# Patient Record
Sex: Female | Born: 1955 | Race: White | Hispanic: No | Smoking: Former smoker
Health system: Southern US, Community
[De-identification: ages and names within clinical notes are randomized; demographics above are authoritative.]

## PROBLEM LIST (undated history)

## (undated) DIAGNOSIS — E785 Hyperlipidemia, unspecified: Secondary | ICD-10-CM

## (undated) DIAGNOSIS — J439 Emphysema, unspecified: Secondary | ICD-10-CM

## (undated) DIAGNOSIS — R112 Nausea with vomiting, unspecified: Secondary | ICD-10-CM

## (undated) DIAGNOSIS — N19 Unspecified kidney failure: Secondary | ICD-10-CM

## (undated) DIAGNOSIS — R0902 Hypoxemia: Secondary | ICD-10-CM

## (undated) DIAGNOSIS — I509 Heart failure, unspecified: Secondary | ICD-10-CM

## (undated) DIAGNOSIS — M797 Fibromyalgia: Secondary | ICD-10-CM

## (undated) DIAGNOSIS — K219 Gastro-esophageal reflux disease without esophagitis: Secondary | ICD-10-CM

## (undated) DIAGNOSIS — M722 Plantar fascial fibromatosis: Secondary | ICD-10-CM

## (undated) DIAGNOSIS — D649 Anemia, unspecified: Secondary | ICD-10-CM

## (undated) DIAGNOSIS — D689 Coagulation defect, unspecified: Secondary | ICD-10-CM

## (undated) DIAGNOSIS — M419 Scoliosis, unspecified: Secondary | ICD-10-CM

## (undated) DIAGNOSIS — J45909 Unspecified asthma, uncomplicated: Secondary | ICD-10-CM

## (undated) DIAGNOSIS — M3320 Polymyositis, organ involvement unspecified: Secondary | ICD-10-CM

## (undated) DIAGNOSIS — M1990 Unspecified osteoarthritis, unspecified site: Secondary | ICD-10-CM

## (undated) DIAGNOSIS — Z5189 Encounter for other specified aftercare: Secondary | ICD-10-CM

## (undated) DIAGNOSIS — M542 Cervicalgia: Secondary | ICD-10-CM

## (undated) DIAGNOSIS — B009 Herpesviral infection, unspecified: Secondary | ICD-10-CM

## (undated) DIAGNOSIS — K5090 Crohn's disease, unspecified, without complications: Secondary | ICD-10-CM

## (undated) DIAGNOSIS — Z9289 Personal history of other medical treatment: Secondary | ICD-10-CM

## (undated) DIAGNOSIS — K589 Irritable bowel syndrome without diarrhea: Secondary | ICD-10-CM

## (undated) DIAGNOSIS — T7840XA Allergy, unspecified, initial encounter: Secondary | ICD-10-CM

## (undated) DIAGNOSIS — F419 Anxiety disorder, unspecified: Secondary | ICD-10-CM

## (undated) DIAGNOSIS — Z9889 Other specified postprocedural states: Secondary | ICD-10-CM

## (undated) DIAGNOSIS — G7100 Muscular dystrophy, unspecified: Secondary | ICD-10-CM

## (undated) DIAGNOSIS — I10 Essential (primary) hypertension: Secondary | ICD-10-CM

## (undated) DIAGNOSIS — G43909 Migraine, unspecified, not intractable, without status migrainosus: Secondary | ICD-10-CM

## (undated) DIAGNOSIS — R079 Chest pain, unspecified: Secondary | ICD-10-CM

## (undated) DIAGNOSIS — E119 Type 2 diabetes mellitus without complications: Secondary | ICD-10-CM

## (undated) DIAGNOSIS — J449 Chronic obstructive pulmonary disease, unspecified: Secondary | ICD-10-CM

## (undated) DIAGNOSIS — K922 Gastrointestinal hemorrhage, unspecified: Secondary | ICD-10-CM

## (undated) HISTORY — DX: Type 2 diabetes mellitus without complications: E11.9

## (undated) HISTORY — DX: Unspecified asthma, uncomplicated: J45.909

## (undated) HISTORY — DX: Personal history of other medical treatment: Z92.89

## (undated) HISTORY — DX: Encounter for other specified aftercare: Z51.89

## (undated) HISTORY — PX: ROTATOR CUFF REPAIR: SHX139

## (undated) HISTORY — PX: OOPHORECTOMY: SHX86

## (undated) HISTORY — DX: Gastro-esophageal reflux disease without esophagitis: K21.9

## (undated) HISTORY — DX: Allergy, unspecified, initial encounter: T78.40XA

## (undated) HISTORY — DX: Essential (primary) hypertension: I10

## (undated) HISTORY — DX: Hyperlipidemia, unspecified: E78.5

## (undated) HISTORY — DX: Anemia, unspecified: D64.9

## (undated) HISTORY — DX: Emphysema, unspecified: J43.9

## (undated) HISTORY — DX: Heart failure, unspecified: I50.9

## (undated) HISTORY — DX: Hypoxemia: R09.02

## (undated) HISTORY — PX: CHOLECYSTECTOMY: SHX55

## (undated) HISTORY — DX: Coagulation defect, unspecified: D68.9

## (undated) HISTORY — DX: Gastrointestinal hemorrhage, unspecified: K92.2

## (undated) HISTORY — DX: Unspecified kidney failure: N19

## (undated) HISTORY — PX: BREAST SURGERY: SHX581

## (undated) HISTORY — DX: Anxiety disorder, unspecified: F41.9

## (undated) HISTORY — PX: COSMETIC SURGERY: SHX468

## (undated) HISTORY — PX: COLON SURGERY: SHX602

## (undated) HISTORY — PX: ABDOMINAL HYSTERECTOMY: SHX81

## (undated) HISTORY — PX: HERNIA REPAIR: SHX51

## (undated) HISTORY — PX: OTHER SURGICAL HISTORY: SHX169

## (undated) HISTORY — DX: Chest pain, unspecified: R07.9

## (undated) HISTORY — PX: MASTECTOMY PARTIAL / LUMPECTOMY: SUR851

---

## 1998-09-09 ENCOUNTER — Ambulatory Visit (HOSPITAL_COMMUNITY): Admission: RE | Admit: 1998-09-09 | Discharge: 1998-09-09 | Payer: Self-pay | Admitting: Internal Medicine

## 1998-11-22 ENCOUNTER — Emergency Department (HOSPITAL_COMMUNITY): Admission: EM | Admit: 1998-11-22 | Discharge: 1998-11-22 | Payer: Self-pay | Admitting: Emergency Medicine

## 1998-11-23 ENCOUNTER — Emergency Department (HOSPITAL_COMMUNITY): Admission: EM | Admit: 1998-11-23 | Discharge: 1998-11-23 | Payer: Self-pay | Admitting: Emergency Medicine

## 1998-11-24 ENCOUNTER — Emergency Department (HOSPITAL_COMMUNITY): Admission: EM | Admit: 1998-11-24 | Discharge: 1998-11-24 | Payer: Self-pay | Admitting: Emergency Medicine

## 1999-06-08 ENCOUNTER — Encounter: Payer: Self-pay | Admitting: Internal Medicine

## 1999-06-08 ENCOUNTER — Encounter: Admission: RE | Admit: 1999-06-08 | Discharge: 1999-06-08 | Payer: Self-pay | Admitting: Internal Medicine

## 1999-06-11 ENCOUNTER — Encounter: Payer: Self-pay | Admitting: Internal Medicine

## 1999-06-11 ENCOUNTER — Encounter: Admission: RE | Admit: 1999-06-11 | Discharge: 1999-06-11 | Payer: Self-pay | Admitting: Internal Medicine

## 1999-06-12 ENCOUNTER — Inpatient Hospital Stay (HOSPITAL_COMMUNITY): Admission: AD | Admit: 1999-06-12 | Discharge: 1999-06-18 | Payer: Self-pay | Admitting: Internal Medicine

## 1999-06-13 ENCOUNTER — Encounter: Payer: Self-pay | Admitting: Internal Medicine

## 1999-06-14 ENCOUNTER — Encounter: Payer: Self-pay | Admitting: Internal Medicine

## 1999-06-15 ENCOUNTER — Encounter: Payer: Self-pay | Admitting: Internal Medicine

## 1999-06-16 ENCOUNTER — Encounter: Payer: Self-pay | Admitting: Internal Medicine

## 1999-06-17 ENCOUNTER — Encounter: Payer: Self-pay | Admitting: Internal Medicine

## 1999-07-20 ENCOUNTER — Encounter: Payer: Self-pay | Admitting: Internal Medicine

## 1999-07-21 ENCOUNTER — Inpatient Hospital Stay (HOSPITAL_COMMUNITY): Admission: EM | Admit: 1999-07-21 | Discharge: 1999-07-24 | Payer: Self-pay | Admitting: Emergency Medicine

## 1999-07-22 ENCOUNTER — Encounter: Payer: Self-pay | Admitting: Internal Medicine

## 1999-09-27 ENCOUNTER — Encounter: Admission: RE | Admit: 1999-09-27 | Discharge: 1999-09-27 | Payer: Self-pay | Admitting: Internal Medicine

## 1999-09-27 ENCOUNTER — Encounter: Payer: Self-pay | Admitting: Internal Medicine

## 1999-10-05 ENCOUNTER — Ambulatory Visit (HOSPITAL_COMMUNITY): Admission: RE | Admit: 1999-10-05 | Discharge: 1999-10-05 | Payer: Self-pay | Admitting: Internal Medicine

## 1999-10-11 ENCOUNTER — Encounter: Admission: RE | Admit: 1999-10-11 | Discharge: 1999-10-11 | Payer: Self-pay | Admitting: Urology

## 1999-10-11 ENCOUNTER — Encounter: Payer: Self-pay | Admitting: Urology

## 2000-03-06 ENCOUNTER — Emergency Department (HOSPITAL_COMMUNITY): Admission: EM | Admit: 2000-03-06 | Discharge: 2000-03-06 | Payer: Self-pay | Admitting: Emergency Medicine

## 2000-04-04 ENCOUNTER — Emergency Department (HOSPITAL_COMMUNITY): Admission: EM | Admit: 2000-04-04 | Discharge: 2000-04-04 | Payer: Self-pay | Admitting: Emergency Medicine

## 2000-05-08 ENCOUNTER — Emergency Department (HOSPITAL_COMMUNITY): Admission: EM | Admit: 2000-05-08 | Discharge: 2000-05-08 | Payer: Self-pay | Admitting: Emergency Medicine

## 2000-05-09 ENCOUNTER — Ambulatory Visit (HOSPITAL_COMMUNITY): Admission: RE | Admit: 2000-05-09 | Discharge: 2000-05-09 | Payer: Self-pay | Admitting: Gastroenterology

## 2000-05-09 ENCOUNTER — Encounter (INDEPENDENT_AMBULATORY_CARE_PROVIDER_SITE_OTHER): Payer: Self-pay | Admitting: Specialist

## 2000-07-05 ENCOUNTER — Encounter: Payer: Self-pay | Admitting: Pulmonary Disease

## 2000-07-05 ENCOUNTER — Ambulatory Visit (HOSPITAL_COMMUNITY): Admission: RE | Admit: 2000-07-05 | Discharge: 2000-07-05 | Payer: Self-pay | Admitting: Pulmonary Disease

## 2000-08-21 ENCOUNTER — Encounter: Admission: RE | Admit: 2000-08-21 | Discharge: 2000-08-21 | Payer: Self-pay | Admitting: Internal Medicine

## 2000-08-21 ENCOUNTER — Encounter: Payer: Self-pay | Admitting: Internal Medicine

## 2000-12-04 ENCOUNTER — Encounter: Payer: Self-pay | Admitting: Internal Medicine

## 2000-12-04 ENCOUNTER — Encounter: Admission: RE | Admit: 2000-12-04 | Discharge: 2000-12-04 | Payer: Self-pay | Admitting: Internal Medicine

## 2001-03-12 ENCOUNTER — Encounter: Payer: Self-pay | Admitting: Internal Medicine

## 2001-03-12 ENCOUNTER — Encounter: Admission: RE | Admit: 2001-03-12 | Discharge: 2001-03-12 | Payer: Self-pay | Admitting: Internal Medicine

## 2001-03-22 ENCOUNTER — Encounter: Payer: Self-pay | Admitting: General Surgery

## 2001-03-23 ENCOUNTER — Encounter (INDEPENDENT_AMBULATORY_CARE_PROVIDER_SITE_OTHER): Payer: Self-pay | Admitting: Specialist

## 2001-03-23 ENCOUNTER — Ambulatory Visit (HOSPITAL_COMMUNITY): Admission: RE | Admit: 2001-03-23 | Discharge: 2001-03-23 | Payer: Self-pay | Admitting: General Surgery

## 2001-07-12 ENCOUNTER — Emergency Department (HOSPITAL_COMMUNITY): Admission: EM | Admit: 2001-07-12 | Discharge: 2001-07-12 | Payer: Self-pay

## 2001-10-15 ENCOUNTER — Ambulatory Visit (HOSPITAL_BASED_OUTPATIENT_CLINIC_OR_DEPARTMENT_OTHER): Admission: RE | Admit: 2001-10-15 | Discharge: 2001-10-15 | Payer: Self-pay | Admitting: Internal Medicine

## 2002-03-01 ENCOUNTER — Encounter: Payer: Self-pay | Admitting: General Surgery

## 2002-03-01 ENCOUNTER — Encounter: Admission: RE | Admit: 2002-03-01 | Discharge: 2002-03-01 | Payer: Self-pay | Admitting: General Surgery

## 2003-03-07 ENCOUNTER — Encounter: Payer: Self-pay | Admitting: General Surgery

## 2003-03-07 ENCOUNTER — Encounter: Admission: RE | Admit: 2003-03-07 | Discharge: 2003-03-07 | Payer: Self-pay | Admitting: General Surgery

## 2003-03-14 ENCOUNTER — Encounter: Payer: Self-pay | Admitting: Pulmonary Disease

## 2003-03-14 ENCOUNTER — Ambulatory Visit (HOSPITAL_COMMUNITY): Admission: RE | Admit: 2003-03-14 | Discharge: 2003-03-14 | Payer: Self-pay | Admitting: Pulmonary Disease

## 2004-03-24 ENCOUNTER — Other Ambulatory Visit: Admission: RE | Admit: 2004-03-24 | Discharge: 2004-03-24 | Payer: Self-pay | Admitting: *Deleted

## 2004-04-23 ENCOUNTER — Encounter: Admission: RE | Admit: 2004-04-23 | Discharge: 2004-04-23 | Payer: Self-pay | Admitting: *Deleted

## 2004-06-09 ENCOUNTER — Encounter (INDEPENDENT_AMBULATORY_CARE_PROVIDER_SITE_OTHER): Payer: Self-pay | Admitting: *Deleted

## 2004-06-10 ENCOUNTER — Inpatient Hospital Stay (HOSPITAL_COMMUNITY): Admission: RE | Admit: 2004-06-10 | Discharge: 2004-06-11 | Payer: Self-pay | Admitting: Orthopedic Surgery

## 2005-02-01 ENCOUNTER — Encounter (INDEPENDENT_AMBULATORY_CARE_PROVIDER_SITE_OTHER): Payer: Self-pay | Admitting: *Deleted

## 2005-02-01 ENCOUNTER — Ambulatory Visit (HOSPITAL_COMMUNITY): Admission: RE | Admit: 2005-02-01 | Discharge: 2005-02-01 | Payer: Self-pay | Admitting: General Surgery

## 2005-02-01 ENCOUNTER — Ambulatory Visit (HOSPITAL_BASED_OUTPATIENT_CLINIC_OR_DEPARTMENT_OTHER): Admission: RE | Admit: 2005-02-01 | Discharge: 2005-02-01 | Payer: Self-pay | Admitting: General Surgery

## 2005-04-25 ENCOUNTER — Other Ambulatory Visit: Admission: RE | Admit: 2005-04-25 | Discharge: 2005-04-25 | Payer: Self-pay | Admitting: *Deleted

## 2005-05-09 ENCOUNTER — Encounter: Admission: RE | Admit: 2005-05-09 | Discharge: 2005-05-09 | Payer: Self-pay | Admitting: Unknown Physician Specialty

## 2005-05-18 ENCOUNTER — Encounter: Admission: RE | Admit: 2005-05-18 | Discharge: 2005-05-18 | Payer: Self-pay | Admitting: Unknown Physician Specialty

## 2005-08-25 ENCOUNTER — Encounter: Admission: RE | Admit: 2005-08-25 | Discharge: 2005-08-25 | Payer: Self-pay | Admitting: Orthopedic Surgery

## 2005-08-26 ENCOUNTER — Ambulatory Visit (HOSPITAL_BASED_OUTPATIENT_CLINIC_OR_DEPARTMENT_OTHER): Admission: RE | Admit: 2005-08-26 | Discharge: 2005-08-26 | Payer: Self-pay | Admitting: Orthopedic Surgery

## 2006-08-22 ENCOUNTER — Other Ambulatory Visit: Admission: RE | Admit: 2006-08-22 | Discharge: 2006-08-22 | Payer: Self-pay | Admitting: *Deleted

## 2007-03-05 ENCOUNTER — Ambulatory Visit (HOSPITAL_COMMUNITY): Admission: RE | Admit: 2007-03-05 | Discharge: 2007-03-05 | Payer: Self-pay | Admitting: Anesthesiology

## 2007-04-18 ENCOUNTER — Encounter: Admission: RE | Admit: 2007-04-18 | Discharge: 2007-04-18 | Payer: Self-pay | Admitting: Unknown Physician Specialty

## 2007-11-13 ENCOUNTER — Other Ambulatory Visit: Admission: RE | Admit: 2007-11-13 | Discharge: 2007-11-13 | Payer: Self-pay | Admitting: *Deleted

## 2008-05-15 ENCOUNTER — Ambulatory Visit (HOSPITAL_BASED_OUTPATIENT_CLINIC_OR_DEPARTMENT_OTHER): Admission: RE | Admit: 2008-05-15 | Discharge: 2008-05-15 | Payer: Self-pay | Admitting: *Deleted

## 2008-11-14 ENCOUNTER — Encounter: Admission: RE | Admit: 2008-11-14 | Discharge: 2008-11-14 | Payer: Self-pay | Admitting: Internal Medicine

## 2008-12-15 ENCOUNTER — Emergency Department (HOSPITAL_BASED_OUTPATIENT_CLINIC_OR_DEPARTMENT_OTHER): Admission: EM | Admit: 2008-12-15 | Discharge: 2008-12-15 | Payer: Self-pay | Admitting: Emergency Medicine

## 2008-12-15 ENCOUNTER — Ambulatory Visit: Payer: Self-pay | Admitting: Diagnostic Radiology

## 2009-10-30 ENCOUNTER — Encounter: Admission: RE | Admit: 2009-10-30 | Discharge: 2009-10-30 | Payer: Self-pay | Admitting: Unknown Physician Specialty

## 2010-08-29 ENCOUNTER — Encounter: Payer: Self-pay | Admitting: Internal Medicine

## 2010-08-30 ENCOUNTER — Encounter: Payer: Self-pay | Admitting: Unknown Physician Specialty

## 2010-11-08 ENCOUNTER — Other Ambulatory Visit (HOSPITAL_BASED_OUTPATIENT_CLINIC_OR_DEPARTMENT_OTHER): Payer: Self-pay | Admitting: *Deleted

## 2010-11-08 DIAGNOSIS — Z1231 Encounter for screening mammogram for malignant neoplasm of breast: Secondary | ICD-10-CM

## 2010-11-16 ENCOUNTER — Ambulatory Visit (HOSPITAL_BASED_OUTPATIENT_CLINIC_OR_DEPARTMENT_OTHER): Payer: BC Managed Care – PPO

## 2010-11-16 LAB — DIFFERENTIAL
Basophils Absolute: 0.1 10*3/uL (ref 0.0–0.1)
Eosinophils Absolute: 0.3 10*3/uL (ref 0.0–0.7)
Lymphocytes Relative: 46 % (ref 12–46)
Monocytes Absolute: 0.5 10*3/uL (ref 0.1–1.0)
Neutro Abs: 3.8 10*3/uL (ref 1.7–7.7)

## 2010-11-16 LAB — CBC
Hemoglobin: 15 g/dL (ref 12.0–15.0)
MCHC: 34.6 g/dL (ref 30.0–36.0)
MCV: 88.5 fL (ref 78.0–100.0)
Platelets: 434 10*3/uL — ABNORMAL HIGH (ref 150–400)
RDW: 12.4 % (ref 11.5–15.5)
WBC: 8.7 10*3/uL (ref 4.0–10.5)

## 2010-11-16 LAB — BASIC METABOLIC PANEL
CO2: 29 mEq/L (ref 19–32)
Calcium: 10.2 mg/dL (ref 8.4–10.5)
GFR calc Af Amer: >60 mL/min (ref >60)
Potassium: 3.7 mEq/L (ref 3.5–5.1)
Sodium: 144 mEq/L (ref 135–145)

## 2010-11-16 LAB — URINALYSIS, ROUTINE W REFLEX MICROSCOPIC
Bilirubin Urine: NEGATIVE
Ketones, ur: NEGATIVE mg/dL

## 2010-11-24 ENCOUNTER — Ambulatory Visit (HOSPITAL_BASED_OUTPATIENT_CLINIC_OR_DEPARTMENT_OTHER): Payer: BC Managed Care – PPO

## 2010-12-01 ENCOUNTER — Ambulatory Visit (HOSPITAL_BASED_OUTPATIENT_CLINIC_OR_DEPARTMENT_OTHER)
Admission: RE | Admit: 2010-12-01 | Discharge: 2010-12-01 | Disposition: A | Discharge status: Home / Self Care | Payer: BC Managed Care – PPO | Source: Ambulatory Visit | Attending: Diagnostic Radiology | Admitting: Diagnostic Radiology

## 2010-12-01 DIAGNOSIS — Z1231 Encounter for screening mammogram for malignant neoplasm of breast: Secondary | ICD-10-CM | POA: Insufficient documentation

## 2010-12-24 NOTE — Op Note (Signed)
NAME:  Kayla Ryan, Kayla Ryan                ACCOUNT NO.:  [id number]   MEDICAL RECORD NO.:  [id number]          PATIENT TYPE:  AMB   LOCATION:  DSC                          FACILITY:  [facility]   PHYSICIAN:  Alta Corning, M.D.   DATE OF BIRTH:  10/17/1955   DATE OF PROCEDURE:  08/26/2005  DATE OF DISCHARGE:                                 OPERATIVE REPORT   PREOPERATIVE DIAGNOSIS:  1.  Carpal tunnel syndrome, left.  2.  CMC arthritis.   POSTOPERATIVE DIAGNOSIS:  1.  Carpal tunnel syndrome, left.  2.  CMC arthritis.   PROCEDURE:  1.  Left carpal tunnel release.  2.  CMC injection under anesthesia.   SURGEON:  Alta Corning, M.D.   ASSISTANT:  Gary Fleet, P.A.-C.   ANESTHESIA:  General.   BRIEF HISTORY:  55 year old female with a long history of having significant  pain in the thumb, index, and long finger of her left hand.  She had had  previous carpal tunnel issues from the opposite side and felt that this was  similar on this side.  EMGs showed that she had carpal tunnel syndrome.  We  talked about treatment options including injection therapy, activity  modification, but ultimately because of continued complaints of pain and  failure of conservative care, she is taken to the operating room for carpal  tunnel surgery.  Preoperative x-rays showed that she had significant  narrowing at the [facility] joint.  She was also having some positive grind and  pain at the [facility] joint.  We talked about injection therapy and she asked that  this be done under anesthesia and certainly we were willing to accommodate  this.  She is brought to the operating room for these procedures.   DESCRIPTION OF PROCEDURE:  The patient was taken to the operating room and  after adequate anesthesia was obtained with general anesthetic, the patient  was placed supine upon the operating table.  The left arm was prepped and  draped in the usual sterile fashion.  Following this, the arm was  exsanguinated and blood  pressure tourniquet inflated to 250 mmHg.  Following  this, a curved incision was made just ulnar to the midline wrist crease.  The subcutaneous tissue were dissected down to the level of the volar carpal  ligament.  The volar carpal ligament was clearly identified, divided, with a  small rent and then a Freer elevator was used to make sure the nerve was not  adherent on the under surface.  The ligament was then divided proximally and  distally such that a gloved finger could be placed in the wound proximally  and distally.  The median nerve was identified and noted to have the  hourglass constriction under the area of the volar ligament and there was no  problems with the motor branch of the median nerve.  At this point, the  wound was copiously irrigated and  suctioned dry.  The skin was closed with a combination of interrupted and  running suture.   At this point, the [facility] joint was palpated and 1 mL  of Depo-Medrol 80 mg per  mL was injected into the first [facility] joint.  At this point, a sterile  compressive dressing was applied as well as a volar plaster and the patient  was taken to the recovery room and noted to be in satisfactory condition.  Estimated blood loss was none.      Alta Corning, M.D.  Electronically Signed     JLG/MEDQ  D:  08/26/2005  T:  08/26/2005  Job:  [id number]

## 2010-12-24 NOTE — Discharge Summary (Signed)
[place]  Patient:    Kayla Ryan, Kayla Ryan                      MRN: [id number] Adm. Date:  07/21/99 Disc. Date: 07/24/99 Attending:  Randall Hiss L. Marlou Sa, M.D.                           Discharge Summary  FINAL DIAGNOSES: 1. Gastritis and gastroduodenitis without hemorrhage. 2. Otitis media. 3. Chronic sinusitis. 4. Migraine, unspecified. 5. Bronchitis, acute and chronic. 6. Irritable bowel syndrome. 7. Recurrent depression. 8. Prolonged post-traumatic stress.  OPERATIONS AND PROCEDURES:  EGD with closed biopsy, per Dr. Amedeo Plenty.  HISTORY OF PRESENT ILLNESS:  Patient is a 55 year old married white female who presents with a several day history of increasing right-sided abdominal pain with cramping, nonradiating, associated with nausea and vomiting.  Patient notably now presents with increasing fever, headache, arthralgia, sore throat, and cough. Strep test has been negative.  She was noted to have a white count elevated at 16,500.  PHYSICAL EXAMINATION:  Per admission H&P.  HOSPITAL COURSE:  Patient was admitted for further evaluation of fever with nausea and vomiting.  She was initially admitted for 23 hour observation.  She was given IV fluids with potassium supplementation as well.  She notably, however, did not improve.  On further exam, there was evidence for significant bronchitis, which was more apparent after hydration.  She also had recurring fever as well.  Fever initially was maximum 102.8 and defervesced to 100.7.  Blood cultures were obtained, which were negative.  Patient was continued on empiric antibiotics. he notably had recurring abdominal pain on further evaluation, mainly in the left upper quadrant and left lower quadrant.  Mild tenderness in the right upper quadrant as well.  She subsequently underwent abdominal CT scan.  This showed no significant mass effect or cancer.  No evidence of hernias or obstructions. Patient  was continued on analgesia for pain, with gradual advancement of her diet. She notably, however, continued to have significant upper abdominal discomfort.  She was subsequently seen in evaluation by Dr. Amedeo Plenty.  She thereafter underwent an EGD, which showed mild gastroenteritis.  No other masses were found.  Cold test  was negative.  The patient was continued on proton pump inhibitors, which she, ver the subsequent days, gradually improved.  Patient notably, during the hospital stay, was found to demonstrate significant  situational stress.  This was discussed during the hospital stay.  It was made quite clear that her stress did exacerbate abdominal pain, as well. Arrangements were made for patient to be seen initially by Dr. Rhona Raider, psychiatry.  She was subsequently started on Zoloft for depression, with ______ follow-up as an outpatient.  With antibiotic therapy, patients bronchitis as well as sinusitis also improved considerably.  Appetite gradually improved, and patient became more ambulatory.  By July 24, 1999 she was feeling considerably better. Abdominal pain, gastritis, and irritable bowel syndrome were under much better control. er bronchitis was much improved, as well.  She was felt to be stable for discharge.  MEDICATIONS AT THE TIME OF DISCHARGE: 1. Zoloft 50 mg p.o. q.d. 2. Cipro 500 mg b.i.d. x 5 days. 3. Lomotil q.i.d. p.r.n. diarrhea. 4. Continue Prevacid 30 mg b.i.d. 5. Darvocet N-100 q.4h. p.r.n. pain.  FOLLOW-UP:  Patient will be followed up as an outpatient by GI and Dr. Rhona Raider. She will be seen in the office in two  weeks time. DD:  08/19/99 TD:  08/19/99 Job: [id number]

## 2010-12-24 NOTE — Op Note (Signed)
NAME:  Kayla Ryan, Kayla Ryan                ACCOUNT NO.:  [id number]   MEDICAL RECORD NO.:  [id number]          PATIENT TYPE:  AMB   LOCATION:  DAY                          [facility]  [facility]   PHYSICIAN:  Doran Heater. Fore, M.D.   DATE OF BIRTH:  July 19, 1956   DATE OF PROCEDURE:  06/09/2004  DATE OF DISCHARGE:                                 OPERATIVE REPORT   PREOPERATIVE DIAGNOSES:  1.  Abdominal and pelvic pain, status post hysterectomy and bilateral      salpingo-oophorectomy for endometriosis in 1998.  2.  Cystocele, rectocele, probable enterocele.   POSTOPERATIVE DIAGNOSES:  1.  Abdominal and pelvic pain, status post hysterectomy and bilateral      salpingo-oophorectomy for endometriosis in 1998.  2.  Cystocele, rectocele, probable enterocele.   OPERATION:  1.  Laparoscopy with extensive enterolysis.  2.  Anterior and posterior colporrhaphy with repair of enterocele.   ANESTHESIA:  General orotracheal.   OPERATOR:  Doran Heater. Warnell Forester, M.D.   FIRST ASSISTANT:  Deidre Ala, M.D.   INDICATION FOR SURGERY:  The patient is a 55 year old, who has the above-  noted problems and was counseled as to the need for surgery to evaluate and  treat these problems.  She was fully counseled as to the nature of the  procedure and the risks involved to include risk of anesthesia; injury to  bowel, bladder, blood vessels, ureters, postoperative hemorrhage, infection,  recuperation.  She fully understands all of these considerations and wishes  to proceed on 09 June 2004 and has signed informed consent for this  procedure.   OPERATIVE FINDINGS:  On laparoscopy, it was noted that she had multiple  adhesions involving loops of bowel to each other and to the anterior  peritoneal surface.  On pelvic exam, there was noted to be a moderate  cystocele and moderate to severe rectocele with enterocele present.   DESCRIPTION OF PROCEDURE:  With the patient under general anesthesia,  prepared and draped in the  usual sterile fashion, a Foley catheter was  placed in the bladder.  Bladder was then distended using 600 mL of normal  saline, and a Bonanno suprapubic catheter was placed without difficulty.  The Foley catheter was reattached to straight drainage, and the patient was  then re-prepared and draped for a laparoscopic procedure.  An incision was  made in the lower pole of the umbilicus with insertion of the Veress cannula  and insufflation of 3 L of carbon dioxide.  The disposable 10 mm trocar  through the operative scope, and the scope itself was then inserted into the  peritoneal cavity.  The disposable 5 mm probe was inserted through a stab  wound followed by the symphysis pubis in the midline.  The above-noted  findings were visualized.  Bipolar electrocoagulation and sharp dissection  were used to carefully dissect the adhesions.  This required approximately  45 minutes of operative time to successfully lyse all the adhesions.  This  was accomplished without difficulty and without injury to the bowel or other  viscera in the pelvis.  After observing the area for  hemostasis, after  reducing intra-abdominal pressure by allowing gas to escape, the instruments  were removed from the peritoneal cavity.  Gas was allowed to fully escape,  and the incisions were closed with fascial sutures of 2-0 Vicryl and  subcuticular sutures of 3-0 plain catgut.   The patient was then repositioned for a vaginal procedure.  A weighted  speculum was placed in the posterior vagina, and the anterior vaginal wall  was grasped using Allis clamps.  A solution of 1% lidocaine with 1:100  epinephrine was injected at the midline under the cystocele and urethrocele.  An incision was made in this area with dissection up to the urethrovesical  angle.  The cystocele was reduced using imbricated sutures of 0 Vicryl.  Redundant vaginal mucosa was excised, and the mucosa was then reapproximated  and rendered hemostatic with  a continuous interlocking suture of 2-0 chromic  catgut.   Attention was then directed to the posterior colporrhaphy.  Allis clamps  were placed at the mucocutaneous junctions and in the midline overlying the  rectocele and enterocele.  Again, a solution of 1% lidocaine was injected in  this area; a total of 8 mL was used through the entire procedure.  An  incision was made in the midline of the vaginal mucosa with gradual  dissection of the underlying fascia and enterocele sac off this area.  The  enterocele was reduced and closed off with pursestring sutures of 0 Vicryl.  This was accomplished in layers with good reduction of the enterocele.  The  rectocele was likewise reduced with imbricating layers of 0 Vicryl.  Redundant vaginal mucosa was trimmed, and the mucosa was then reapproximated  and rendered hemostatic with a continuous interlocking suture of 2-0 chromic  catgut.  Estimated blood loss for the entire procedure was less than 50 mL.  The patient was taken to the recovery room in good condition with clear  urine in the suprapubic catheter tubing.  She will be admitted following  surgery.      SRF/MEDQ  D:  06/09/2004  T:  06/09/2004  Job:  [id number]   cc:   Deidre Ala, M.D.  [address] [place]  [place] [address]  Fax: [phone]

## 2010-12-24 NOTE — Discharge Summary (Signed)
[place]  Patient:    Kayla Ryan, Kayla Ryan                       MRN: [id number] Adm. Date:  [id number] Disc. Date: [id number] Attending:  Rogers Blocker                           Discharge Summary  FINAL DIAGNOSES: 1. Abdominal pain. 2. Rule out colonic spasm. 3. Gastroesophageal reflux disease. 4. Tobacco abuse. 5. Mild bronchitis. 6. Sinusitis. 7. Anxiety disorder, recent stress.  PROCEDURES: 1. Upper GI with small-bowel follow-through. 2. MRI scan of the brain. 3. Ultrasound of the gallbladder.  HISTORY OF PRESENT ILLNESS:  The patient is a 55 year old married white female who has been experiencing recurrent nausea and vomiting of one weeks duration.  She had not been feeling well until approximately two weeks ago. At that time, she noted increasing epigastric discomfort.  This was associated with mild heartburn at times as  well.  The patient had also noted problems with mild constipation with intermittent diarrhea.  She has a history of previously irritable bowel syndrome as well as mild gastritis.  The patient has been taking Prevacid and Levsin on a regular basis; however, over the past week she has noted increasing problems with heartburn and indigestion.  The patient subsequently began experiencing recurrent nausea and vomiting over the one week prior to admission.  Appetite remained variable.  Due to progression of her symptoms, she was admitted for further evaluation and therapy.  PAST MEDICAL HISTORY:  Per admission H&P.  PHYSICAL EXAMINATION:  Per admission H&P.  HOSPITAL COURSE:  The patient was admitted for further evaluation of recurrent nausea and vomiting.  She has a history suggestive of gastroesophageal reflux as well.  She was started on IV fluids for hydration.  The patient was started on IV Pepcid as well as IV Reglan for reflux disease.  Over the subsequent days, the patient continued to have intermittent abdominal pain,  the etiology of which remained unclear.  She did undergo a barium swallow with upper GI and small-bowel follow-through.  This did not demonstrate evidence of hiatal hernia.  No stricture was noted.  No obstruction was noted to the small-bowel follow-through.  Due to the patients episodic nausea, she was started on Reglan as well.  She was continued on full-time pump inhibitors.  On further serial exam, she was found to have intermittent left upper quadrant tenderness.  A gallbladder study was subsequently performed, looking at the gallbladder and pancreas.  This showed no evidence of stones, questionable sludge.  There was a question of fluid around the gallbladder.   In lieu of the questionable status of her cause of her stomach pain, she was subsequently seen by GI.  The patient was evaluated by Dr. Amedeo Plenty extensively.  It was felt that no further workup would be pursued at this time.  It was felt that the patient has significant psychological overlay for GI symptoms.  The issues regarding stressors were discussed with the patient.  She did acknowledge part of this and it was planned on having further followup as an outpatient.  By June 18, 1999, the patient was doing considerably better. Plans were subsequently made for her to be discharged home.  DISCHARGE MEDICATIONS: 1. Prevacid 30 mg p.o. b.i.d. 2. Xanax 1 mg 3-4 x a day. 3. Claritin 10 mg p.o. q.d. 4. Darvocet-N 100 1-2 p.o.  q.4h. p.r.n. 5. Phenergan 25 mg p.o. q.4h. p.r.n. 6. Levsin 1 sublingual q.4h. p.r.n.  DIET:  The patient will reduce the caffeine her diet as well as fried foods and avoid sweets.  ACTIVITY:  As tolerated.  FOLLOWUP:  She will be seen in the office in two weeks time for followup. DD:  10/21/99 TD:  10/21/99 Job: [id number]

## 2010-12-24 NOTE — Op Note (Signed)
[facility]  Patient:    [name], [name]                                 Visit Number: [id number] MRN: [id number]          Service Type: DSU Location: DAY Attending:  Marlan Palau Proc. Date: 03/23/01 Adm. Date:  [id number]                             Operative Report  PREOPERATIVE DIAGNOSES:  Right nipple discharge and intraductal papilloma, right breast.  POSTOPERATIVE DIAGNOSES:  Right nipple discharge and intraductal papilloma, right breast.  SURGICAL PROCEDURE:  Right breast central duct excision.  SURGEON:  Darene Lamer. Hoxworth, M.D.  ANESTHESIA:  General.  BRIEF HISTORY:  Kayla Ryan is a 55 year old female with a long history of fibrocystic disease.  She has persistent bloody nipple discharge from the right, and a ductogram has been performed showing a good sized intraductal papilloma at about 9 oclock.  After discussion of options, we have elected to proceed with excision of the central ducts in the right breast.  The surgical procedure, the indications, the risks of bleeding, infection were discussed and understood preoperatively.  She is now brought to the operating room for this procedure.  DESCRIPTION OF OPERATION:  The patient was brought to the operating room and placed in the supine position on the operating table, and general endotracheal anesthesia was induced.  The right breast was sterilely prepped and draped.  A curvilinear incision was made along the areolar border, laterally and inferiorly and dissection carried down to the subcutaneous tissue.  Dissection was carried over to the central ducts as then entered the nipple, and these were encircled with a hemostat and tied with a 2-0 silk tie.  The ducts were then sharply dissected off of the back of the nipple which was completely mobilized.  The central ducts were then excised in a cone fashion down into the breast for 3-4 cm and excised with cautery.   Hemostasis was obtained with cautery.  The soft tissue was infiltrated with Marcaine.  The subcutaneous was then reapproximated with interrupted 4-0 Monocryl and the skin with running subcuticular 4-0 Monocryl and Steri-Strips.  Sponge, needle, and instrument counts were correct.  Dry sterile dressing was applied, and the patient was taken to recovery in good condition. DD:  03/23/01 TD:  03/23/01 Job: [id number]

## 2010-12-24 NOTE — Op Note (Signed)
NAME:  Kayla Ryan, Kayla Ryan                ACCOUNT NO.:  [id number]   MEDICAL RECORD NO.:  [id number]          PATIENT TYPE:  AMB   LOCATION:  DAY                          FACILITY:  [facility]   PHYSICIAN:  Alta Corning, M.D.   DATE OF BIRTH:  Sep 05, 1955   DATE OF PROCEDURE:  06/09/2004  DATE OF DISCHARGE:                                 OPERATIVE REPORT   PREOPERATIVE DIAGNOSES:  1.  Impingement of acromioclavicular joint arthritis, left shoulder.  2.  Carpal metacarpal joint arthritis   POSTOPERATIVE DIAGNOSES:  1.  Impingement of acromioclavicular joint arthritis, left shoulder.  2.  Carpal metacarpal joint arthritis  3.  Posterior superior labral tear.   PRINCIPAL PROCEDURE:  1.  Anterior lateral decompression from both the lateral and posterior      portal, subacromial.  2.  Distal clavicle resection from anterior portal.  3.  Debridement of posterior superior labral tear from within the      glenohumeral joint.  4.  Injection of first [place] joint left.   SURGEON:  Alta Corning, M.D.   ASSISTANT:  Gary Fleet, P.A.   ANESTHESIA:  General.   BRIEF HISTORY:  Ms. Mangini is a 55 year old female with a long history of  having significant left shoulder pain. She ultimately had been evaluated  multiple times in the office and undergone rehab injection therapy  antiinflammatory medication because of failure of all these issues and  improvement with injection therapy.  It was ultimately felt she was  appropriate for subacromial decompression and distal clavicle resection.  Arthroscopic she was brought to the operating room for this procedure. She  was also needing some sort of a GYN procedure and discussion was made with  her physician and he felt it would be appropriate to do these things  simultaneously and she was scheduled to have both these done at the same  time.   DESCRIPTION OF PROCEDURE:  The patient was brought to the operating room and  after an adequate level of  anesthesia was obtained with general anesthetic,  the patient was placed supine on the operating table and then moved to the  beach chair position. All bony prominences were well padded and the head was  protected. The first [facility] joint was injected with 2 of Xylocaine and 1 of  Depo-Medrol and this went uneventfully and a Band-Aid was placed. Following  this, attention was turned to the left shoulder which was prepped and draped  in the usual sterile fashion and following this routine arthroscopic  examination of the shoulder revealed there was no significant under surface  rotator cuff pathology, there was a posterior superior labral tear which was  debrided from the glenohumeral joint. Attention was then turned out of the  glenohumeral joint into the subacromial space. The biceps tendon was within  normal limits and there was no arthritic change in the glenohumeral joint.  In the subacromial space, a significant bursal inflammation was identified  as well as an anterior spur. An anterolateral decompression was performed  from a lateral and posterior compartment.  Attention was then turned  to the  distal clavicle.  15 mm of distal clavicle was then resected from the  anterior compartment. Following this, the rotator cuff superior surface was  debrided with a suction shaver and the rotator cuff was probed at length. No  full thickness tearing was identified. After a subtotal bursectomy was  performed, the final check was made of the acromioplasty, a switching stick  was used to debride this from both the lateral compartment as well as from  the posterior compartment and once we were comfortable that the __________  was decompressed, the distal clavicle was resected and the bursa was  debrided, the cannulas were removed from the shoulder and the arms were  copiously irrigated and suctioned dry. The portals were then closed with a  bandage. A sterile compressive dressing was applied and the  patient was  taken to the recovery room and was noted to be in satisfactory condition.  Estimated blood loss for this procedure was none.      JLG/MEDQ  D:  06/09/2004  T:  06/09/2004  Job:  [id number]

## 2010-12-24 NOTE — H&P (Signed)
NAME:  Kayla Ryan, Kayla Ryan                ACCOUNT NO.:  [id number]   MEDICAL RECORD NO.:  [id number]          PATIENT TYPE:  AMB   LOCATION:  DAY                          FACILITY:  [facility]   PHYSICIAN:  Doran Heater. Fore, M.D.   DATE OF BIRTH:  [birth year]   DATE OF ADMISSION:  06/09/2004  DATE OF DISCHARGE:                                HISTORY & PHYSICAL   CHIEF COMPLAINT:  Cystocele and rectocele from a diagnosis made by another  physician.   HISTORY:  The patient is a 55 year old gravida 2 para 0 whose last menstrual  period was in 1998, at which time she had a hysterectomy and bilateral  salpingo-oophorectomy for reported endometriosis.  She has had progressively  worsening pelvic pressure over the past several years and is noted to have  had a significant cystocele and rectocele by another physician and referred  to a gyn physician.  She was initially seen in our office on March 24, 2004, at which time a Pap smear was totally normal.  On examination, it was  found that she had moderately severe cystocele, rectocele, and probable  enterocele on examination.  She is admitted at this time for repair of these  problems as well as exploratory procedure - laparoscopy, possible laparotomy  for lower abdominal and pelvic pain, possibly related to endometriosis or  possible adhesions.  She has been fully counseled as to the nature of the  procedure and the risks involved, to include risks of anesthesia, injury to  bowel, bladder, blood vessels, ureters, postoperative hemorrhage, infection,  recuperation, use of a catheter postoperatively to allow for bladder  drainage.  She fully understands all these considerations and wishes to  proceed on June 09, 2004.  At this time, she is also to have a procedure  by Dr. Berenice Primas for shoulder problems.  She has been evaluated by Dr. Excell Seltzer  for abdominal pain as well.  He saw her on April 30, 2004 and felt that  there was no indication for surgical  intervention from his standpoint.   PAST MEDICAL HISTORY:  1.  A number of surgeries, to include right ovarian cystectomy in 1980.  2.  Spigelian hernia in 1995.  3.  Excision of right breast mass in 1995.  4.  Conization of the cervix in 1996 for dysplasia.  5.  Hysterectomy, bilateral salpingo-oophorectomy in 1998.  6.  Ductectomy of her right nipple area in 2001.   She takes a number of medications, to include:  1.  Singulair 10 mg daily.  2.  Allegra 180.  3.  Aciphex two daily.  4.  Zelnorm two daily.  5.  Xanax 1 mg t.i.d.  6.  Lorazepam 10 mg at bedtime.  7.  Darvocet-N 100 p.r.n.  8.  Diuretic as necessary.  9.  Cymbalta 60 mg daily.  10. Lortab 10/500 as necessary.  11. She also requires daily stool softeners and enema regularly for      constipation-dominant IBS.   She is allergic to SULFA MEDICATION.   She is a nonsmoker, nondrinker.   FAMILY HISTORY:  Includes  both parents with lung cancer.  Grandparents on  both sides of the family with cardiovascular disease.  Grandmother and  mother with diabetes.   REVIEW OF SYSTEMS:  HEENT:  Frequent headaches.  CARDIORESPIRATORY:  History  of hypertension.  GASTROINTESTINAL:  As noted above.  GENITOURINARY:  As  noted above.  NEUROMUSCULAR:  Multiple arthritis problems, particularly  recently pain and decreased mobility of her left shoulder.   PHYSICAL EXAMINATION:  HEIGHT:  5 feet 8-1/2 inches.  WEIGHT:  188 pounds.  BLOOD PRESSURE:  124/76.  PULSE:  76.  RESPIRATIONS:  18.  GENERAL:  Well-developed white female in no acute distress.  HEENT:  Within normal limits.  NECK:  Supple, without masses, adenopathy, or bruits.  HEART:  Regular rate and rhythm, without murmurs.  LUNGS:  Clear to P&A.  BREASTS:  Sitting and lying, no mass.  Axillae negative.  Status post  lumpectomy on the right breast, with well-healed incision.  ABDOMEN:  Soft, without mass.  Tender in both lower quadrants.  PELVIC:  External genitalia,  Bartholin's, urethra, and Skene's glands within  normal limits.  Vagina shows atrophic changes.  Cervix, uterus, and adnexae  are absent.  She is noted to have a moderate to severe cystocele, rectocele,  and probable enterocele on rectovaginal exam.  EXTREMITIES:  Within normal limits.  CENTRAL NERVOUS SYSTEM:  Grossly intact.  SKIN:  Without suspicious lesions.   IMPRESSION:  Abdominal and pelvic pain.  Rule out endometriosis/adhesions.  Cystocele, rectocele, probable enterocele.   DISPOSITION:  As noted above.      SRF/MEDQ  D:  06/03/2004  T:  06/03/2004  Job:  [id number]   cc:   Alta Corning, M.D.  [place]  [place] [address]  Fax: [phone]

## 2010-12-24 NOTE — Discharge Summary (Signed)
NAME:  Kayla Ryan, Kayla Ryan                ACCOUNT NO.:  [id number]   MEDICAL RECORD NO.:  [id number]          PATIENT TYPE:  INP   LOCATION:  2703                         FACILITY:  [facility]   PHYSICIAN:  Doran Heater. Fore, M.D.   DATE OF BIRTH:  [birth year]   DATE OF ADMISSION:  06/09/2004  DATE OF DISCHARGE:  06/11/2004                                 DISCHARGE SUMMARY   HISTORY:  The patient is a 55 year old with pelvic pain, cystocele,  rectocele, probable enterocele for laparoscopy, anterior and posterior  colporrhaphy, and probable repair of enterocele.  The remainder of her  history and physical are as previously dictated.  Laboratory data include  preoperative hemoglobin of 11.5, platelets elevated slightly at 413,  urinalysis within normal limits.  On a CMET panel, AST was elevated at 39,  ALT elevated at 50.  PT was 11.9, INR 0.8.  Chest x-ray was normal.  EKG  showed incomplete right bundle-branch block and was read as a borderline  EKG.   HOSPITAL COURSE:  The patient was taken to the operating room on 2 November,  at which time repair of shoulder joint problems was done by Dr. Berenice Primas and  laparoscopy with extensive enterolysis, anterior and posterior colporrhaphy,  and repair of enterocele were done by Dr. Warnell Forester.  The patient did well  postoperatively.  Diet and ambulation were progressed over several days  postoperatively.  On the morning of 4 November, she was afebrile, although  her temperature had been elevated in the 100+ range, felt to be because of  poor respiratory efforts.  She was doing well on oral analgesics and had  voided slightly after clamping the suprapubic tube.  It was felt she could  discharged at this time.   FINAL DIAGNOSES (GYN):  1.  Pelvic pain.  2.  Extensive pelvic adhesions.  3.  Cystocele, rectocele, enterocele.   OPERATION:  Laparoscopy with extensive enterolysis, anterior and posterior  colporrhaphy, repair of enterocele.  No pathology report.   DISPOSITION:  Discharged home to return to the office in 2 weeks for follow  up.  She was instructed to gradually progress her activities over several  weeks at home and to limit lifting and driving for several weeks.  She is to  call when she is able to void with less than 60 mL residual in her bladder x  2 in a 12 hour period.   DISCHARGE MEDICATIONS:  1.  Mepergan Fortis #30 to be taken 1-2 q.6h. p.r.n. pain.  2.  Doxycycline 100 mg #12 to be taken 1 b.i.d.  3.  Urecholine 25 mg #40 to be taken 1 q.i.d.      SRF/MEDQ  D:  06/12/2004  T:  06/13/2004  Job:  [id number]

## 2010-12-24 NOTE — Op Note (Signed)
NAME:  Kayla Ryan, Kayla Ryan                ACCOUNT NO.:  [id number]   MEDICAL RECORD NO.:  [id number]          PATIENT TYPE:  AMB   LOCATION:  DSC                          FACILITY:  [facility]   PHYSICIAN:  Marland Kitchen T. Hoxworth, M.D.DATE OF BIRTH:  05/23/1956   DATE OF PROCEDURE:  DATE OF DISCHARGE:                                 OPERATIVE REPORT   PREOPERATIVE DIAGNOSIS:  Myopathy.   POSTOPERATIVE DIAGNOSIS:  Myopathy.   SURGICAL PROCEDURES:  Muscle biopsy, right quadriceps.   SURGEON:  Darene Lamer. Hoxworth, M.D.   ANESTHESIA:  Local.   BRIEF HISTORY:  [name] is a 55 year old female with progressive muscle  weakness and clinical concern of myopathy.  Muscle biopsy from the right  quadriceps has been requested.  The nature of the procedure and risks of  bleeding and infection were discussed and she is now brought to the minor  surgery area of this procedure.   DESCRIPTION OF PROCEDURE:  The patient was brought to the minor surgery room  and in  the supine position, the right thigh was sterilely prepped and  draped.  Local anesthesia was used to infiltrate the skin and underlying  soft tissue.  A longitudinal incision was made on the anterior right thigh  and dissection carried down through the subcutaneous tissue.  The fascia was  incised and dissection carried down onto the right quadriceps.  Three strips  of muscle approximately 2 x 0.5 cm were excised and tacked to a tongue  blade, and preserved  in saline and sent immediately to Pathology.  The  fascia was closed with interrupted 3-0 Vicryl, the skin with running of 4-0  nylon.  A dry sterile dressing was applied and the patient tolerated  procedure well.       BTH/MEDQ  D:  02/01/2005  T:  02/01/2005  Job:  [id number]

## 2010-12-24 NOTE — Procedures (Signed)
[place]  Patient:    Kayla Ryan                        MRN: [id number] Proc. Date: 07/23/99 Adm. Date:  [id number] Attending:  Rogers Blocker CC:         Carolann Littler. Marlou Sa, M.D.                           Procedure Report  PROCEDURE:  Esophagogastroduodenoscopy.  INDICATION:  Nausea, vomiting, abdominal pain with fairly extensive workup unrevealing to date.  The procedure is to assess for upper GI tract lesions contributing to her symptoms.  DESCRIPTION OF PROCEDURE:  The patient was placed in the left lateral decubitus  position and placed on the pulse monitor with continuous low flow oxygen delivered by nasal cannula.  She was sedated with 75 mg IV Demerol and 10 mg IV Versed.  Olympus video endoscope was advanced under direct vision into the oropharynx and esophagus.  The esophagus was straight and of normal caliber at the squamocolumnar line at 38 cm.  There was no visible hiatal hernia, ring, stricture, or other abnormality at the GE junction.  The stomach was entered and a small amount of liquid secretions were suctioned rom the fundus.  Retroflex view of the cardia was unremarkable.  The fundus appeared normal.  The body and antrum showed some erythema and granulation consistent with gastritis.  There were no focal erosions or ulcers.  The pylorus was not formed. It easily allowed passage of the endoscope tip to the duodenum.  Both bulb and second portion were well inspected and appeared to be within normal limits.  The endoscope was then withdrawn and the patient returned to the recovery room n stable condition.  She tolerated the procedure well and there were no immediate  complications.  IMPRESSION: 1. Gastritis. 2. Otherwise normal endoscopy.  PLAN:  Await CLOtest and we will await psychological evaluation initiated by Dr. Rogers Blocker. DD:  07/23/99 TD:  07/24/99 Job: [id number]

## 2010-12-24 NOTE — Procedures (Signed)
[facility]  Patient:    Kayla Ryan, Kayla Ryan                       MRN: [id number] Proc. Date: 05/09/00 Adm. Date:  [id number] Disc. Date: [id number] Attending:  Lajean Saver CC:         Carolann Littler. Marlou Sa, M.D.   Procedure Report  PROCEDURE:  Esophagogastroduodenoscopy.  INDICATIONS FOR PROCEDURE:  A patient with long history of GI complaints of unclear etiology who has had increased nausea and vomiting and what sounds like either odynophagia or dysphagia.  DESCRIPTION OF PROCEDURE:  The patient was placed in the left lateral decubitus position and placed on the pulse monitor with continuous low flow oxygen delivered by nasal cannula. She was sedated with 100 mg IV Demerol and 10 mg IV Versed. The Olympus video endoscope was advanced under direct vision into the oropharynx and esophagus. The esophagus was straight and of normal caliber with the squamocolumnar line at 38 cm. There was a suggestion of possible candidal esophagitis with yellowish plaques of 1-2 mm or less in diameter in the proximal and middle esophagus. She had had quite a bit of hurricane spray which sometimes can cause a somewhat similar appearance but these areas seemed to be adherent and given her symptoms of odynophagia, I went ahead and obtained brushings to rule out candidiasis otherwise the esophagus appeared normal down to the gastroesophageal junction with no typical peptic esophagitis, ring, stricture, hiatal hernia or other abnormality of the gastroesophageal junction. The stomach was entered and a small amount of liquid secretions were suctioned from the fundus. A retroflexed view of the cardia was unremarkable. The fundus, body, antrum, and pylorus all appeared normal. The duodenum was entered and both the bulb and second portion were well inspected and appeared to be within normal limits. The endoscope was then withdrawn and the patient returned to the recovery room in stable  condition. The patient tolerated the procedure well and there were no immediate complications.  IMPRESSION:  Possible candidal esophagitis otherwise normal study.  PLAN:  Await brushings and will treat with antifungals as indicated. DD:  05/09/00 TD:  05/10/00 Job: [id number]

## 2011-06-06 ENCOUNTER — Ambulatory Visit (HOSPITAL_BASED_OUTPATIENT_CLINIC_OR_DEPARTMENT_OTHER)
Admission: RE | Admit: 2011-06-06 | Discharge: 2011-06-06 | Disposition: A | Discharge status: Home / Self Care | Payer: BC Managed Care – PPO | Source: Ambulatory Visit | Attending: Physician Assistant | Admitting: Physician Assistant

## 2011-06-06 ENCOUNTER — Other Ambulatory Visit (HOSPITAL_BASED_OUTPATIENT_CLINIC_OR_DEPARTMENT_OTHER): Payer: Self-pay | Admitting: Physician Assistant

## 2011-06-06 DIAGNOSIS — M545 Low back pain: Secondary | ICD-10-CM

## 2011-06-06 DIAGNOSIS — R52 Pain, unspecified: Secondary | ICD-10-CM

## 2011-06-06 DIAGNOSIS — M47817 Spondylosis without myelopathy or radiculopathy, lumbosacral region: Secondary | ICD-10-CM | POA: Insufficient documentation

## 2011-06-08 ENCOUNTER — Emergency Department (INDEPENDENT_AMBULATORY_CARE_PROVIDER_SITE_OTHER): Payer: BC Managed Care – PPO

## 2011-06-08 ENCOUNTER — Emergency Department (HOSPITAL_BASED_OUTPATIENT_CLINIC_OR_DEPARTMENT_OTHER)
Admission: EM | Admit: 2011-06-08 | Discharge: 2011-06-08 | Disposition: A | Discharge status: Home / Self Care | Payer: BC Managed Care – PPO | Attending: Emergency Medicine | Admitting: Emergency Medicine

## 2011-06-08 DIAGNOSIS — J4489 Other specified chronic obstructive pulmonary disease: Secondary | ICD-10-CM | POA: Insufficient documentation

## 2011-06-08 DIAGNOSIS — R109 Unspecified abdominal pain: Secondary | ICD-10-CM | POA: Insufficient documentation

## 2011-06-08 DIAGNOSIS — R1031 Right lower quadrant pain: Secondary | ICD-10-CM

## 2011-06-08 DIAGNOSIS — K589 Irritable bowel syndrome without diarrhea: Secondary | ICD-10-CM | POA: Insufficient documentation

## 2011-06-08 DIAGNOSIS — R197 Diarrhea, unspecified: Secondary | ICD-10-CM | POA: Insufficient documentation

## 2011-06-08 DIAGNOSIS — J449 Chronic obstructive pulmonary disease, unspecified: Secondary | ICD-10-CM | POA: Insufficient documentation

## 2011-06-08 DIAGNOSIS — IMO0001 Reserved for inherently not codable concepts without codable children: Secondary | ICD-10-CM | POA: Insufficient documentation

## 2011-06-08 DIAGNOSIS — K5090 Crohn's disease, unspecified, without complications: Secondary | ICD-10-CM | POA: Insufficient documentation

## 2011-06-08 HISTORY — DX: Fibromyalgia: M79.7

## 2011-06-08 HISTORY — DX: Muscular dystrophy, unspecified: G71.00

## 2011-06-08 HISTORY — DX: Crohn's disease, unspecified, without complications: K50.90

## 2011-06-08 HISTORY — DX: Herpesviral infection, unspecified: B00.9

## 2011-06-08 HISTORY — DX: Chronic obstructive pulmonary disease, unspecified: J44.9

## 2011-06-08 HISTORY — DX: Unspecified osteoarthritis, unspecified site: M19.90

## 2011-06-08 HISTORY — DX: Irritable bowel syndrome, unspecified: K58.9

## 2011-06-08 HISTORY — DX: Migraine, unspecified, not intractable, without status migrainosus: G43.909

## 2011-06-08 LAB — COMPREHENSIVE METABOLIC PANEL
AST: 29 U/L (ref 0–37)
Albumin: 4.3 g/dL (ref 3.5–5.2)
BUN: 19 mg/dL (ref 6–23)
Calcium: 9.5 mg/dL (ref 8.4–10.5)
Creatinine, Ser: 0.8 mg/dL (ref 0.50–1.10)
GFR calc non Af Amer: 80 mL/min — ABNORMAL LOW (ref >90)

## 2011-06-08 LAB — URINALYSIS, ROUTINE W REFLEX MICROSCOPIC
Bilirubin Urine: NEGATIVE
Glucose, UA: NEGATIVE mg/dL
Hgb urine dipstick: NEGATIVE
Ketones, ur: NEGATIVE mg/dL
Protein, ur: NEGATIVE mg/dL

## 2011-06-08 LAB — DIFFERENTIAL
Basophils Absolute: 0.1 10*3/uL (ref 0.0–0.1)
Basophils Relative: 1 % (ref 0–1)
Eosinophils Relative: 3 % (ref 0–5)
Monocytes Absolute: 0.4 10*3/uL (ref 0.1–1.0)
Monocytes Relative: 6 % (ref 3–12)

## 2011-06-08 LAB — CBC
HCT: 36 % (ref 36.0–46.0)
Hemoglobin: 12.1 g/dL (ref 12.0–15.0)
MCH: 29.2 pg (ref 26.0–34.0)
MCHC: 33.6 g/dL (ref 30.0–36.0)
MCV: 86.7 fL (ref 78.0–100.0)
RDW: 13.4 % (ref 11.5–15.5)

## 2011-06-08 MED ORDER — HYDROMORPHONE HCL 1 MG/ML IJ SOLN
1.0000 mg | Freq: Once | INTRAMUSCULAR | Status: AC
  Administered 2011-06-08: 1 mg via INTRAVENOUS
  Filled 2011-06-08: qty 1

## 2011-06-08 MED ORDER — SODIUM CHLORIDE 0.9 % IV SOLN
INTRAVENOUS | Status: DC
  Administered 2011-06-08: 14:00:00 via INTRAVENOUS

## 2011-06-08 MED ORDER — IOHEXOL 300 MG/ML  SOLN
100.0000 mL | Freq: Once | INTRAMUSCULAR | Status: AC | PRN
  Administered 2011-06-08: 100 mL via INTRAVENOUS

## 2011-06-08 MED ORDER — ONDANSETRON HCL 4 MG/2ML IJ SOLN
4.0000 mg | Freq: Once | INTRAMUSCULAR | Status: AC
  Administered 2011-06-08: 4 mg via INTRAVENOUS
  Filled 2011-06-08: qty 2

## 2011-06-08 NOTE — ED Provider Notes (Signed)
History     CSN: [id number] Arrival date & time: 06/08/2011 12:22 PM   First MD Initiated Contact with Patient 06/08/11 1303      Chief Complaint  Patient presents with  . Abdominal Pain  . Groin Pain  . Diarrhea  . Loss of Consciousness    (Consider location/radiation/quality/duration/timing/severity/associated sxs/prior treatment) HPI Comments: Patient is a 55 year old woman who has a stabbing pain in the right groin. 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  She has had a prior hysterectomy. She has had prior laparoscopy for endometriosis. She says she has had the pain for a couple of days but at 6:30 AM the pain got strong. She had nausea but no vomiting. There is been some trouble urinating. She's had a low-grade temperature. She therefore sought evaluation.  Patient is a 55 y.o. female presenting with abdominal pain, groin pain, diarrhea, and syncope. The history is provided by the patient. No language interpreter was used.  Abdominal Pain The primary symptoms of the illness include abdominal pain and diarrhea. The current episode started more than 2 days ago. The onset of the illness was gradual. The problem has been rapidly worsening.  The abdominal pain began 2 days ago. The pain came on gradually. The abdominal pain has been rapidly worsening since its onset. The abdominal pain is located in the RLQ. The abdominal pain does not radiate. The severity of the abdominal pain is 5/10. The abdominal pain is relieved by nothing.  Change in bowel habit: She has diarrhea frequently because of irritable bowel syndrome. Risk factors for an acute abdominal problem include a history of abdominal surgery.  Groin Pain Associated symptoms include abdominal pain.  Diarrhea The primary symptoms include abdominal pain and diarrhea.  Loss of Consciousness Associated symptoms include abdominal pain.    Past Medical History  Diagnosis Date  . Crohn disease   . IBS  (irritable bowel syndrome)   . Arthritis   . Fibromyalgia   . COPD (chronic obstructive pulmonary disease)   . Chronic bronchitis   . Emphysema   . Muscular dystrophy   . Herpes   . Migraines     Past Surgical History  Procedure Date  . Hernia repair   . Abdominal hysterectomy   . Mastectomy partial / lumpectomy   . Rotator cuff repair   . Bone spur   . Oophorectomy     History reviewed. No pertinent family history.  History  Substance Use Topics  . Smoking status: Current Everyday Smoker -- 1.0 packs/day    Types: Cigarettes  . Smokeless tobacco: Never Used  . Alcohol Use: No    OB History    Grav Para Term Preterm Abortions TAB SAB Ect Mult Living                  Review of Systems  Constitutional: Negative.   HENT: Negative.   Eyes: Negative.   Respiratory: Negative.   Cardiovascular: Positive for syncope.  Gastrointestinal: Positive for abdominal pain and diarrhea.  Genitourinary: Positive for difficulty urinating.  Musculoskeletal: Negative.   Neurological: Negative.   Psychiatric/Behavioral: Negative.     Allergies  Sulfa antibiotics and Methotrexate derivatives  Home Medications   Current Outpatient Rx  Name Route Sig Dispense Refill  . COENZYME Q10 30 MG PO CAPS Oral Take 30 mg by mouth daily.      Marland Kitchen DICLOFENAC SODIUM 75 MG PO TBEC Oral Take 75 mg by mouth 3 (three) times daily.      Marland Kitchen  FENTANYL 50 MCG/HR TD PT72 Transdermal Place 1 patch onto the skin.      Marland Kitchen GABAPENTIN 300 MG PO CAPS Oral Take 300 mg by mouth 3 (three) times daily.      Marland Kitchen GEMFIBROZIL 600 MG PO TABS Oral Take 600 mg by mouth 2 (two) times daily before a meal.      . LIDOCAINE 5 % EX PTCH Transdermal Place 1 patch onto the skin daily. Remove & Discard patch within 12 hours or as directed by MD     . LORATADINE 10 MG PO TABS Oral Take 10 mg by mouth 2 (two) times daily.      Marland Kitchen LORAZEPAM 0.5 MG PO TABS Oral Take 0.5 mg by mouth every 8 (eight) hours.      . MULTIVITAMINS PO CAPS  Oral Take 1 capsule by mouth daily.      Marland Kitchen OMEPRAZOLE 40 MG PO CPDR Oral Take 40 mg by mouth 2 (two) times daily.      . OXYCODONE-ACETAMINOPHEN 5-325 MG PO TABS Oral Take 1 tablet by mouth 3 (three) times daily as needed.      . TOPIRAMATE 100 MG PO TABS Oral Take 100 mg by mouth daily.      . TRIAMTERENE-HCTZ 37.5-25 MG PO CAPS Oral Take 1 capsule by mouth every morning.      Marland Kitchen VALACYCLOVIR HCL 500 MG PO TABS Oral Take 500 mg by mouth 2 (two) times daily.        BP 121/66  Pulse 92  Temp(Src) 98.3 F (36.8 C) (Oral)  Resp 16  Ht 5' 8"  (1.727 m)  Wt 137 lb (62.143 kg)  BMI 20.83 kg/m2  SpO2 96%  Physical Exam  Nursing note and vitals reviewed. Constitutional: She is oriented to person, place, and time.       Slender late-middle-aged woman in moderate distress with right lower abdominal pain.  HENT:  Head: Normocephalic and atraumatic.  Right Ear: External ear normal.  Left Ear: External ear normal.  Eyes: Conjunctivae and EOM are normal. Pupils are equal, round, and reactive to light.  Neck: Normal range of motion. Neck supple. No thyromegaly present.  Cardiovascular: Normal rate, regular rhythm and normal heart sounds.   Pulmonary/Chest: Effort normal and breath sounds normal.  Abdominal: Soft. Bowel sounds are normal.       She has right lower quadrant tenderness, but no mass rebound or rigidity. There is no hernia apparent.  Musculoskeletal: Normal range of motion.  Lymphadenopathy:    She has no cervical adenopathy.  Neurological: She is alert and oriented to person, place, and time. She has normal reflexes.       No sensory or motor loss.  Skin: Skin is warm and dry.  Psychiatric: She has a normal mood and affect. Her behavior is normal.    ED Course  Procedures (including critical care time)  Labs Reviewed  COMPREHENSIVE METABOLIC PANEL - Abnormal; Notable for the following:    Potassium 3.3 (*)    Glucose, Bld 116 (*)    Total Bilirubin 0.2 (*)    GFR calc non  Af Amer 80 (*)    All other components within normal limits  URINALYSIS, ROUTINE W REFLEX MICROSCOPIC  CBC  DIFFERENTIAL  LIPASE, BLOOD  URINE CULTURE   Ct Abdomen Pelvis W Contrast  06/08/2011  *RADIOLOGY REPORT*  Clinical Data: Right lower quadrant pain, history of Crohn's disease  CT ABDOMEN AND PELVIS WITH CONTRAST  Technique:  Multidetector CT imaging of the  abdomen and pelvis was performed following the standard protocol during bolus administration of intravenous contrast.  Contrast: 117m OMNIPAQUE IOHEXOL 300 MG/ML IV SOLN  Comparison: None.  Findings: The lung bases are clear.  The liver enhances with no focal abnormality and no ductal dilatation is seen.  No calcified gallstones are noted.  The pancreas is normal in size and the pancreatic duct is not dilated.  The adrenal glands and spleen are unremarkable.  The stomach is distended with contrast and food debris and is unremarkable.  The kidneys enhance with no calculus or mass and no hydronephrosis is seen.  The abdominal aorta is normal in caliber.  No adenopathy is seen.  The urinary bladder is moderately distended and is unremarkable. The uterus has previously been resected.  No fluid is seen within the pelvis.  There is a moderate amount of fluid throughout the colon which can be seen with diarrhea, but no colonic mucosal edema is seen.  The mucosa of the distal and terminal ileum may be minimally prominent but no definite active Crohn's disease is evident.  No bony abnormality is seen.  No abdominal wall hernia is noted.  IMPRESSION:  1.  No definite explanation for the patient's pain is seen. 2.  Some fluid throughout the colon but no colonic mucosal edema. 3.  Minimally prominent mucosa of the distal and terminal ileum may reflect changes of prior Crohn's disease but no definite active Crohn's disease is evident.  Original Report Authenticated By: PJoretta Bachelor M.D.   Results for orders placed during the hospital encounter of 06/08/11   URINALYSIS, ROUTINE W REFLEX MICROSCOPIC      Component Value Range   Color, Urine YELLOW  YELLOW    Appearance CLEAR  CLEAR    Specific Gravity, Urine 1.008  1.005 - 1.030    pH 6.5  5.0 - 8.0    Glucose, UA NEGATIVE  NEGATIVE (mg/dL)   Hgb urine dipstick NEGATIVE  NEGATIVE    Bilirubin Urine NEGATIVE  NEGATIVE    Ketones, ur NEGATIVE  NEGATIVE (mg/dL)   Protein, ur NEGATIVE  NEGATIVE (mg/dL)   Urobilinogen, UA 0.2  0.0 - 1.0 (mg/dL)   Nitrite NEGATIVE  NEGATIVE    Leukocytes, UA NEGATIVE  NEGATIVE   CBC      Component Value Range   WBC 7.8  4.0 - 10.5 (K/uL)   RBC 4.15  3.87 - 5.11 (MIL/uL)   Hemoglobin 12.1  12.0 - 15.0 (g/dL)   HCT 36.0  36.0 - 46.0 (%)   MCV 86.7  78.0 - 100.0 (fL)   MCH 29.2  26.0 - 34.0 (pg)   MCHC 33.6  30.0 - 36.0 (g/dL)   RDW 13.4  11.5 - 15.5 (%)   Platelets 358  150 - 400 (K/uL)  DIFFERENTIAL      Component Value Range   Neutrophils Relative 50  43 - 77 (%)   Neutro Abs 3.9  1.7 - 7.7 (K/uL)   Lymphocytes Relative 41  12 - 46 (%)   Lymphs Abs 3.2  0.7 - 4.0 (K/uL)   Monocytes Relative 6  3 - 12 (%)   Monocytes Absolute 0.4  0.1 - 1.0 (K/uL)   Eosinophils Relative 3  0 - 5 (%)   Eosinophils Absolute 0.2  0.0 - 0.7 (K/uL)   Basophils Relative 1  0 - 1 (%)   Basophils Absolute 0.1  0.0 - 0.1 (K/uL)  COMPREHENSIVE METABOLIC PANEL      Component Value Range  Sodium 138  135 - 145 (mEq/L)   Potassium 3.3 (*) 3.5 - 5.1 (mEq/L)   Chloride 100  96 - 112 (mEq/L)   CO2 28  19 - 32 (mEq/L)   Glucose, Bld 116 (*) 70 - 99 (mg/dL)   BUN 19  6 - 23 (mg/dL)   Creatinine, Ser 0.80  0.50 - 1.10 (mg/dL)   Calcium 9.5  8.4 - 10.5 (mg/dL)   Total Protein 7.7  6.0 - 8.3 (g/dL)   Albumin 4.3  3.5 - 5.2 (g/dL)   AST 29  0 - 37 (U/L)   ALT 26  0 - 35 (U/L)   Alkaline Phosphatase 52  39 - 117 (U/L)   Total Bilirubin 0.2 (*) 0.3 - 1.2 (mg/dL)   GFR calc non Af Amer 80 (*) >90 (mL/min)   GFR calc Af Amer >90  >90 (mL/min)  LIPASE, BLOOD      Component Value  Range   Lipase 20  11 - 59 (U/L)   Dg Lumbar Spine Complete  06/06/2011  *RADIOLOGY REPORT*  Clinical Data: Increased pain in the lower back radiating to the thighs  LUMBAR SPINE - COMPLETE 4+ VIEW  Comparison: Abdomen films of 03/05/2007  Findings: The lumbar vertebrae are in normal alignment. Intervertebral disc spaces appear normal.  Minimal anterior osteophyte formation is present.  No compression deformity is noted.  There is some degenerative change involving the facet joints of L4-5 and L5-S1.  The SI joints appear normal.  IMPRESSION: Normal alignment.  No acute abnormality.  Mild degenerative change of the facet joints of the lower lumbar spine.  Original Report Authenticated By: Joretta Bachelor, M.D.   Ct Abdomen Pelvis W Contrast  06/08/2011  *RADIOLOGY REPORT*  Clinical Data: Right lower quadrant pain, history of Crohn's disease  CT ABDOMEN AND PELVIS WITH CONTRAST  Technique:  Multidetector CT imaging of the abdomen and pelvis was performed following the standard protocol during bolus administration of intravenous contrast.  Contrast: 180m OMNIPAQUE IOHEXOL 300 MG/ML IV SOLN  Comparison: None.  Findings: The lung bases are clear.  The liver enhances with no focal abnormality and no ductal dilatation is seen.  No calcified gallstones are noted.  The pancreas is normal in size and the pancreatic duct is not dilated.  The adrenal glands and spleen are unremarkable.  The stomach is distended with contrast and food debris and is unremarkable.  The kidneys enhance with no calculus or mass and no hydronephrosis is seen.  The abdominal aorta is normal in caliber.  No adenopathy is seen.  The urinary bladder is moderately distended and is unremarkable. The uterus has previously been resected.  No fluid is seen within the pelvis.  There is a moderate amount of fluid throughout the colon which can be seen with diarrhea, but no colonic mucosal edema is seen.  The mucosa of the distal and terminal ileum may  be minimally prominent but no definite active Crohn's disease is evident.  No bony abnormality is seen.  No abdominal wall hernia is noted.  IMPRESSION:  1.  No definite explanation for the patient's pain is seen. 2.  Some fluid throughout the colon but no colonic mucosal edema. 3.  Minimally prominent mucosa of the distal and terminal ileum may reflect changes of prior Crohn's disease but no definite active Crohn's disease is evident.  Original Report Authenticated By: PJoretta Bachelor M.D.   5:13 PM Pt's lab and x-rays did not show any sign of a serious cause  of her abdominal pain.  Pt reassured and released.  F/U at [facility].   1. Abdominal pain         Mylinda Latina III, MD 06/08/11 [phone]

## 2011-06-08 NOTE — ED Notes (Signed)
Pt reports near syncope 3 x in past 3 weeks.  She developed right groin pain and RLQ pain that started this am.  She also reports chronic diarrhea.

## 2011-06-08 NOTE — ED Notes (Signed)
Up to BR ..requesting pain meds  pain7/10

## 2011-06-08 NOTE — ED Notes (Signed)
Requesting nausea meds

## 2011-06-09 LAB — URINE CULTURE: Culture  Setup Time: 201210311819

## 2012-01-05 ENCOUNTER — Other Ambulatory Visit (HOSPITAL_BASED_OUTPATIENT_CLINIC_OR_DEPARTMENT_OTHER): Payer: Self-pay | Admitting: Obstetrics and Gynecology

## 2012-01-05 DIAGNOSIS — Z1231 Encounter for screening mammogram for malignant neoplasm of breast: Secondary | ICD-10-CM

## 2012-01-06 ENCOUNTER — Ambulatory Visit (HOSPITAL_BASED_OUTPATIENT_CLINIC_OR_DEPARTMENT_OTHER): Payer: BC Managed Care – PPO

## 2012-01-10 ENCOUNTER — Ambulatory Visit (HOSPITAL_BASED_OUTPATIENT_CLINIC_OR_DEPARTMENT_OTHER): Payer: BC Managed Care – PPO

## 2013-03-25 ENCOUNTER — Other Ambulatory Visit: Payer: Self-pay | Admitting: Anesthesiology

## 2013-03-25 DIAGNOSIS — M545 Low back pain: Secondary | ICD-10-CM

## 2013-03-27 ENCOUNTER — Other Ambulatory Visit: Payer: Self-pay | Admitting: Anesthesiology

## 2013-04-03 ENCOUNTER — Other Ambulatory Visit: Payer: BC Managed Care – PPO

## 2013-04-05 ENCOUNTER — Ambulatory Visit
Admission: RE | Admit: 2013-04-05 | Discharge: 2013-04-05 | Disposition: A | Discharge status: Home / Self Care | Payer: Medicare Other | Source: Ambulatory Visit | Attending: Anesthesiology | Admitting: Anesthesiology

## 2013-04-05 DIAGNOSIS — M545 Low back pain: Secondary | ICD-10-CM

## 2013-10-30 ENCOUNTER — Encounter: Payer: Self-pay | Admitting: Cardiovascular Disease

## 2013-10-30 ENCOUNTER — Ambulatory Visit (INDEPENDENT_AMBULATORY_CARE_PROVIDER_SITE_OTHER): Payer: Commercial Managed Care - HMO | Admitting: Cardiovascular Disease

## 2013-10-30 VITALS — BP 86/62 | HR 83 | Ht 68.0 in | Wt 134.0 lb

## 2013-10-30 DIAGNOSIS — E785 Hyperlipidemia, unspecified: Secondary | ICD-10-CM | POA: Insufficient documentation

## 2013-10-30 DIAGNOSIS — Z01818 Encounter for other preprocedural examination: Secondary | ICD-10-CM

## 2013-10-30 DIAGNOSIS — R079 Chest pain, unspecified: Secondary | ICD-10-CM

## 2013-10-30 DIAGNOSIS — Z79899 Other long term (current) drug therapy: Secondary | ICD-10-CM

## 2013-10-30 DIAGNOSIS — I209 Angina pectoris, unspecified: Secondary | ICD-10-CM | POA: Insufficient documentation

## 2013-10-30 DIAGNOSIS — I779 Disorder of arteries and arterioles, unspecified: Secondary | ICD-10-CM | POA: Insufficient documentation

## 2013-10-30 DIAGNOSIS — I739 Peripheral vascular disease, unspecified: Secondary | ICD-10-CM

## 2013-10-30 NOTE — Progress Notes (Signed)
10/30/2013 Kayla Ryan   [birth year]  [id number]  Primary Physician Pcp Not In System Primary Cardiologist: Lorretta Harp MD Renae Gloss   HPI:  Ms Drummonds is a 58 year old thin appearing married Caucasian female with no children who is disabled from emphysema, muscle dystrophy, fibromyalgia and neuropathy. She was referred by Dr. Claudie Leach for cardiac catheterization because of ongoing nitrate responsive chest pain with positive cardiac factors despite a normal stress test. She has a 40-pack-year history of tobacco abuse having quit 2 years ago. She  has dyslipidemia intolerant to statins as well as a family history of heart disease. She's developed chest pain over the last 6-8 months it occurs several times a month. It has awakened her from sleep. It radiates to her neck, left upper extremity and is associated with shortness of breath and diaphoresis. She apparently had a normal Myoview stress test however because of ongoing chest pain with positive factors her primary cardiologist, Dr. Claudie Leach, has requested a diagnostic cardiac catheterization to define her anatomy and rule out an ischemic etiology.   Current Outpatient Prescriptions  Medication Sig Dispense Refill  . AMITIZA 24 MCG capsule Take 24 mcg by mouth 2 (two) times daily.      Marland Kitchen aspirin 81 MG tablet Take 81 mg by mouth daily.      Marland Kitchen co-enzyme Q-10 30 MG capsule Take 30 mg by mouth daily.        . diclofenac (VOLTAREN) 75 MG EC tablet Take 75 mg by mouth 3 (three) times daily.        Marland Kitchen doxepin (SINEQUAN) 10 MG capsule Take 10 mg by mouth at bedtime.      . DULoxetine (CYMBALTA) 60 MG capsule Take 60 mg by mouth 2 (two) times daily.      Marland Kitchen estradiol (ESTRACE) 1 MG tablet Take 1 mg by mouth daily.      . fentaNYL (DURAGESIC - DOSED MCG/HR) 50 MCG/HR Place 1 patch onto the skin.        . fluticasone (FLONASE) 50 MCG/ACT nasal spray Place 1 spray into both nostrils daily.      Marland Kitchen gabapentin (NEURONTIN) 300 MG capsule  Take 300 mg by mouth 3 (three) times daily.        Marland Kitchen gemfibrozil (LOPID) 600 MG tablet Take 600 mg by mouth 2 (two) times daily before a meal.        . isosorbide mononitrate (IMDUR) 30 MG 24 hr tablet Take 30 mg by mouth daily.      Marland Kitchen lidocaine (LIDODERM) 5 % Place 1 patch onto the skin daily. Remove & Discard patch within 12 hours or as directed by MD       . loratadine (CLARITIN) 10 MG tablet Take 10 mg by mouth 2 (two) times daily.        Marland Kitchen LORazepam (ATIVAN) 0.5 MG tablet Take 0.5 mg by mouth every 8 (eight) hours.        Marland Kitchen MELATONIN PO Take 15 mg by mouth daily.      . methocarbamol (ROBAXIN) 500 MG tablet Take 500 mg by mouth 3 (three) times daily.      . montelukast (SINGULAIR) 10 MG tablet Take 10 mg by mouth daily.      . Multiple Vitamin (MULTIVITAMIN) capsule Take 1 capsule by mouth daily.        Marland Kitchen NITROSTAT 0.4 MG SL tablet Place 0.4 mg under the tongue every 15 (fifteen) minutes as needed.      Marland Kitchen  omeprazole (PRILOSEC) 40 MG capsule Take 40 mg by mouth 2 (two) times daily.        Marland Kitchen oxyCODONE-acetaminophen (PERCOCET) 5-325 MG per tablet Take 1 tablet by mouth 3 (three) times daily as needed.        . polyethylene glycol powder (GLYCOLAX/MIRALAX) powder Take 4 Containers by mouth daily.      Marland Kitchen PROAIR HFA 108 (90 BASE) MCG/ACT inhaler Inhale 2 puffs into the lungs as needed.      . topiramate (TOPAMAX) 100 MG tablet Take 100 mg by mouth daily.        Marland Kitchen triamterene-hydrochlorothiazide (DYAZIDE) 37.5-25 MG per capsule Take 1 capsule by mouth every morning.        . valACYclovir (VALTREX) 500 MG tablet Take 500 mg by mouth 2 (two) times daily.        . Vitamin D, Ergocalciferol, (DRISDOL) 50000 UNITS CAPS capsule Take 1 Units by mouth once a week.       No current facility-administered medications for this visit.    Allergies  Allergen Reactions  . Sulfa Antibiotics Shortness Of Breath, Swelling and Rash  . Methotrexate Derivatives Swelling and Rash    History   Social History    . Marital Status: Married    Spouse Name: N/A    Number of Children: N/A  . Years of Education: N/A   Occupational History  . Not on file.   Social History Main Topics  . Smoking status: Former Smoker    Types: Cigarettes  . Smokeless tobacco: Never Used  . Alcohol Use: No  . Drug Use: No  . Sexual Activity: Not on file   Other Topics Concern  . Not on file   Social History Narrative  . No narrative on file     Review of Systems: General: negative for chills, fever, night sweats or weight changes.  Cardiovascular: negative for chest pain, dyspnea on exertion, edema, orthopnea, palpitations, paroxysmal nocturnal dyspnea or shortness of breath Dermatological: negative for rash Respiratory: negative for cough or wheezing Urologic: negative for hematuria Abdominal: negative for nausea, vomiting, diarrhea, bright red blood per rectum, melena, or hematemesis Neurologic: negative for visual changes, syncope, or dizziness All other systems reviewed and are otherwise negative except as noted above.    Blood pressure 86/62, pulse 83, height 5' 8"  (1.727 m), weight 134 lb (60.782 kg).  General appearance: alert and no distress Neck: no adenopathy, no carotid bruit, no JVD, supple, symmetrical, trachea midline and thyroid not enlarged, symmetric, no tenderness/mass/nodules Lungs: clear to auscultation bilaterally Heart: regular rate and rhythm, S1, S2 normal, no murmur, click, rub or gallop Abdomen: soft, non-tender; bowel sounds normal; no masses,  no organomegaly Extremities: extremities normal, atraumatic, no cyanosis or edema and 2+ pedal pulses bilaterally  EKG normal sinus rhythm at 83 without ST or T wave changes  ASSESSMENT AND PLAN:   Chest pain Patient gets chest pain one to 2 times a month over the last 6 months. It is likely responsive and it has awakened her from sleep. It radiates to her neck and upper left upper extremity and is associated with shortness of breath  and diaphoresis. She had a normal stress test in the past and was referred for cardiac catheterization to define her anatomy given the positive cardiac risk factors of ongoing nitrate responsive chest pain.. Factors include family history, hyperlipidemia and over 40-pack-year history of tobacco abuse. She does have moderate carotid disease by duplex ultrasound.      Roderic Palau  Adora Fridge MD FACP,FACC,FAHA, [facility] 10/30/2013 3:27 PM

## 2013-10-30 NOTE — Assessment & Plan Note (Signed)
Patient gets chest pain one to 2 times a month over the last 6 months. It is likely responsive and it has awakened her from sleep. It radiates to her neck and upper left upper extremity and is associated with shortness of breath and diaphoresis. She had a normal stress test in the past and was referred for cardiac catheterization to define her anatomy given the positive cardiac risk factors of ongoing nitrate responsive chest pain.. Factors include family history, hyperlipidemia and over 40-pack-year history of tobacco abuse. She does have moderate carotid disease by duplex ultrasound.

## 2013-10-30 NOTE — Patient Instructions (Signed)
Your physician has requested that you have a cardiac catheterization (radially). Cardiac catheterization is used to diagnose and/or treat various heart conditions. Doctors may recommend this procedure for a number of different reasons. The most common reason is to evaluate chest pain. Chest pain can be a symptom of coronary artery disease (CAD), and cardiac catheterization can show whether plaque is narrowing or blocking your heart's arteries. This procedure is also used to evaluate the valves, as well as measure the blood flow and oxygen levels in different parts of your heart. For further information please visit [email or web address].   Following your catheterization, you will not be allowed to drive for 3 days.  No lifting, pushing, or pulling greater that 10 pounds is allowed for 1 week.  When the procedure is scheduled, you will be given a date to have bloodwork done.

## 2013-10-31 ENCOUNTER — Encounter (HOSPITAL_COMMUNITY): Payer: Self-pay | Admitting: Pharmacy Technician

## 2013-10-31 LAB — BASIC METABOLIC PANEL
BUN: 15 mg/dL (ref 6–23)
CHLORIDE: 102 meq/L (ref 96–112)
CO2: 29 mEq/L (ref 19–32)
Calcium: 9.5 mg/dL (ref 8.4–10.5)
Creat: 0.88 mg/dL (ref 0.50–1.10)
Glucose, Bld: 87 mg/dL (ref 70–99)
POTASSIUM: 4.2 meq/L (ref 3.5–5.3)
SODIUM: 139 meq/L (ref 135–145)

## 2013-10-31 LAB — PROTIME-INR
INR: 0.96 (ref <1.50)
Prothrombin Time: 12.7 seconds (ref 11.6–15.2)

## 2013-10-31 LAB — CBC
HCT: 35 % — ABNORMAL LOW (ref 36.0–46.0)
Hemoglobin: 11.9 g/dL — ABNORMAL LOW (ref 12.0–15.0)
MCH: 29.9 pg (ref 26.0–34.0)
MCHC: 34 g/dL (ref 30.0–36.0)
MCV: 87.9 fL (ref 78.0–100.0)
Platelets: 510 10*3/uL — ABNORMAL HIGH (ref 150–400)
RBC: 3.98 MIL/uL (ref 3.87–5.11)
RDW: 14.2 % (ref 11.5–15.5)
WBC: 7.2 10*3/uL (ref 4.0–10.5)

## 2013-10-31 LAB — APTT: aPTT: 38 seconds — ABNORMAL HIGH (ref 24–37)

## 2013-11-04 ENCOUNTER — Telehealth: Payer: Self-pay | Admitting: Cardiovascular Disease

## 2013-11-04 NOTE — Telephone Encounter (Signed)
Returned call and pt verified x 2.  Pt with concerns about chronic nosebleeds, low BP (dizzy a lot) and muscular dystrophy (causes her to fall a lot).  Pt also stated she takes are of her husband with alzheimer's and doesn't "need to be fooling with myself" more than she needs to.  Stated she didn't mention all of this at her appt and didn't think about it until she talked w/ her sister.  Pt wants to discuss the pros and cons of "the metal ball thing" and "the medication."  Pt concerned about falling and nosebleeds with blood thinners.  Pt informed there are no available appts to return to discuss this before her procedure.  Pt would like Dr. Gwenlyn Found to call her.  Informed he and his nurse will be notified.  Pt verbalized understanding and agreed w/ plan.  Message forwarded to Curt Bears, RN to discuss w/ Dr. Gwenlyn Found.

## 2013-11-04 NOTE — Telephone Encounter (Signed)
Please call,having cath on Thursday.She need to ask some questions about her Cath.

## 2013-11-05 NOTE — Telephone Encounter (Signed)
Pt was calling concerned that she had some questions that needed to be answered and no one called her back yesterday after she spoke to [employer]. She would like a call back today to discuss her issues. She stated that she would like for Dr. Gwenlyn Found to call her personally.  JB

## 2013-11-06 NOTE — Telephone Encounter (Signed)
I spoke with patient and answered all questions. Patient verbalized understanding of the procedure.

## 2013-11-07 ENCOUNTER — Ambulatory Visit (HOSPITAL_COMMUNITY)
Admission: RE | Admit: 2013-11-07 | Discharge: 2013-11-07 | Disposition: A | Discharge status: Home / Self Care | Payer: Medicare HMO | Source: Ambulatory Visit | Attending: Cardiovascular Disease | Admitting: Cardiovascular Disease

## 2013-11-07 ENCOUNTER — Encounter (HOSPITAL_COMMUNITY)
Admission: RE | Disposition: A | Discharge status: Home / Self Care | Payer: Self-pay | Source: Ambulatory Visit | Attending: Cardiovascular Disease

## 2013-11-07 DIAGNOSIS — R61 Generalized hyperhidrosis: Secondary | ICD-10-CM | POA: Insufficient documentation

## 2013-11-07 DIAGNOSIS — R0602 Shortness of breath: Secondary | ICD-10-CM | POA: Insufficient documentation

## 2013-11-07 DIAGNOSIS — G7109 Other specified muscular dystrophies: Secondary | ICD-10-CM | POA: Insufficient documentation

## 2013-11-07 DIAGNOSIS — Z7982 Long term (current) use of aspirin: Secondary | ICD-10-CM | POA: Insufficient documentation

## 2013-11-07 DIAGNOSIS — R079 Chest pain, unspecified: Secondary | ICD-10-CM

## 2013-11-07 DIAGNOSIS — Z87891 Personal history of nicotine dependence: Secondary | ICD-10-CM | POA: Insufficient documentation

## 2013-11-07 DIAGNOSIS — IMO0001 Reserved for inherently not codable concepts without codable children: Secondary | ICD-10-CM | POA: Insufficient documentation

## 2013-11-07 DIAGNOSIS — G589 Mononeuropathy, unspecified: Secondary | ICD-10-CM | POA: Insufficient documentation

## 2013-11-07 DIAGNOSIS — J438 Other emphysema: Secondary | ICD-10-CM | POA: Insufficient documentation

## 2013-11-07 DIAGNOSIS — Z01818 Encounter for other preprocedural examination: Secondary | ICD-10-CM

## 2013-11-07 HISTORY — PX: LEFT HEART CATHETERIZATION WITH CORONARY ANGIOGRAM: SHX5451

## 2013-11-07 SURGERY — LEFT HEART CATHETERIZATION WITH CORONARY ANGIOGRAM
Anesthesia: LOCAL

## 2013-11-07 MED ORDER — MIDAZOLAM HCL 2 MG/2ML IJ SOLN
INTRAMUSCULAR | Status: AC
  Filled 2013-11-07: qty 2

## 2013-11-07 MED ORDER — SODIUM CHLORIDE 0.9 % IV SOLN
INTRAVENOUS | Status: DC
  Administered 2013-11-07: 06:00:00 via INTRAVENOUS

## 2013-11-07 MED ORDER — ASPIRIN 81 MG PO CHEW: 81.0000 mg | CHEWABLE_TABLET | ORAL | Status: DC

## 2013-11-07 MED ORDER — NITROGLYCERIN 0.2 MG/ML ON CALL CATH LAB
INTRAVENOUS | Status: AC
  Filled 2013-11-07: qty 1

## 2013-11-07 MED ORDER — FENTANYL CITRATE 0.05 MG/ML IJ SOLN
INTRAMUSCULAR | Status: AC
  Filled 2013-11-07: qty 2

## 2013-11-07 MED ORDER — MORPHINE SULFATE 2 MG/ML IJ SOLN: 1.0000 mg | INTRAMUSCULAR | Status: DC | PRN

## 2013-11-07 MED ORDER — SODIUM CHLORIDE 0.9 % IJ SOLN: 3.0000 mL | INTRAMUSCULAR | Status: DC | PRN

## 2013-11-07 MED ORDER — SODIUM CHLORIDE 0.9 % IV SOLN: INTRAVENOUS | Status: AC

## 2013-11-07 MED ORDER — LIDOCAINE HCL (PF) 1 % IJ SOLN
INTRAMUSCULAR | Status: AC
  Filled 2013-11-07: qty 30

## 2013-11-07 MED ORDER — VERAPAMIL HCL 2.5 MG/ML IV SOLN
INTRAVENOUS | Status: AC
  Filled 2013-11-07: qty 2

## 2013-11-07 MED ORDER — HEPARIN SODIUM (PORCINE) 1000 UNIT/ML IJ SOLN
INTRAMUSCULAR | Status: AC
  Filled 2013-11-07: qty 1

## 2013-11-07 MED ORDER — HEPARIN (PORCINE) IN NACL 2-0.9 UNIT/ML-% IJ SOLN
INTRAMUSCULAR | Status: AC
  Filled 2013-11-07: qty 1500

## 2013-11-07 MED ORDER — DIAZEPAM 5 MG PO TABS
5.0000 mg | ORAL_TABLET | ORAL | Status: AC
Start: 2013-11-07 — End: 2013-11-07
  Administered 2013-11-07: 5 mg via ORAL
  Filled 2013-11-07: qty 1

## 2013-11-07 NOTE — Interval H&P Note (Signed)
Cath Lab Visit (complete for each Cath Lab visit)  Clinical Evaluation Leading to the Procedure:   ACS: no  Non-ACS:    Anginal Classification: CCS III  Anti-ischemic medical therapy: Minimal Therapy (1 class of medications)  Non-Invasive Test Results: Low-risk stress test findings: cardiac mortality <1%/year  Prior CABG: No previous CABG      History and Physical Interval Note:  11/07/2013 7:33 AM  Kayla Ryan  has presented today for surgery, with the diagnosis of CP  The various methods of treatment have been discussed with the patient and family. After consideration of risks, benefits and other options for treatment, the patient has consented to  Procedure(s): LEFT HEART CATHETERIZATION WITH CORONARY ANGIOGRAM (N/A) as a surgical intervention .  The patient's history has been reviewed, patient examined, no change in status, stable for surgery.  I have reviewed the patient's chart and labs.  Questions were answered to the patient's satisfaction.     Lorretta Harp

## 2013-11-07 NOTE — Discharge Instructions (Signed)
Radial Site Care Refer to this sheet in the next few weeks. These instructions provide you with information on caring for yourself after your procedure. Your caregiver may also give you more specific instructions. Your treatment has been planned according to current medical practices, but problems sometimes occur. Call your caregiver if you have any problems or questions after your procedure. HOME CARE INSTRUCTIONS  You may shower the day after the procedure.Remove the bandage (dressing) and gently wash the site with plain soap and water.Gently pat the site dry.  Do not apply powder or lotion to the site.  Do not submerge the affected site in water for 3 to 5 days.  Inspect the site at least twice daily.  Do not flex or bend the affected arm for 24 hours.  No lifting over 5 pounds (2.3 kg) for 5 days after your procedure.  Do not drive home if you are discharged the same day of the procedure. Have someone else drive you.  You may drive 24 hours after the procedure unless otherwise instructed by your caregiver.  Do not operate machinery or power tools for 24 hours.  A responsible adult should be with you for the first 24 hours after you arrive home. What to expect:  Any bruising will usually fade within 1 to 2 weeks.  Blood that collects in the tissue (hematoma) may be painful to the touch. It should usually decrease in size and tenderness within 1 to 2 weeks. SEEK IMMEDIATE MEDICAL CARE IF:  You have unusual pain at the radial site.  You have redness, warmth, swelling, or pain at the radial site.  You have drainage (other than a small amount of blood on the dressing).  You have chills.  You have a fever or persistent symptoms for more than 72 hours.  You have a fever and your symptoms suddenly get worse.  Your arm becomes pale, cool, tingly, or numb.  You have heavy bleeding from the site. Hold pressure on the site. Document Released: 08/27/2010 Document Revised:  10/17/2011 Document Reviewed: 08/27/2010 [facility] Patient Information 2014 [place], [place].

## 2013-11-07 NOTE — H&P (View-Only) (Signed)
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 She was referred by Dr. Claudie Leach for cardiac catheterization because of ongoing nitrate responsive chest pain with positive cardiac factors despite a normal stress test. She has a 40-pack-year history of tobacco abuse having quit 2 years ago. She  has dyslipidemia intolerant to statins as well as a family history of heart disease. She's developed chest pain over the last 6-8 months it occurs several times a month. It has awakened her from sleep. It radiates to her neck, left upper extremity and is associated with shortness of breath and diaphoresis. She apparently had a normal Myoview stress test however because of ongoing chest pain with positive factors her primary cardiologist, Dr. Claudie Leach, has requested a diagnostic cardiac catheterization to define her anatomy and rule out an ischemic etiology.   Current Outpatient Prescriptions  Medication Sig Dispense Refill  . AMITIZA 24 MCG capsule Take 24 mcg by mouth 2 (two) times daily.      Marland Kitchen aspirin 81 MG tablet Take 81 mg by mouth daily.      Marland Kitchen co-enzyme Q-10 30 MG capsule Take 30 mg by mouth daily.        . diclofenac (VOLTAREN) 75 MG EC tablet Take 75 mg by mouth 3 (three) times daily.        Marland Kitchen doxepin (SINEQUAN) 10 MG capsule Take 10 mg by mouth at bedtime.      . DULoxetine (CYMBALTA) 60 MG capsule Take 60 mg by mouth 2 (two) times daily.      Marland Kitchen estradiol (ESTRACE) 1 MG tablet Take 1 mg by mouth daily.      . fentaNYL (DURAGESIC - DOSED MCG/HR) 50 MCG/HR Place 1 patch onto the skin.        . fluticasone (FLONASE) 50 MCG/ACT nasal spray Place 1 spray into both nostrils daily.      Marland Kitchen gabapentin (NEURONTIN) 300 MG capsule  Take 300 mg by mouth 3 (three) times daily.        Marland Kitchen gemfibrozil (LOPID) 600 MG tablet Take 600 mg by mouth 2 (two) times daily before a meal.        . isosorbide mononitrate (IMDUR) 30 MG 24 hr tablet Take 30 mg by mouth daily.      Marland Kitchen lidocaine (LIDODERM) 5 % Place 1 patch onto the skin daily. Remove & Discard patch within 12 hours or as directed by MD       . loratadine (CLARITIN) 10 MG tablet Take 10 mg by mouth 2 (two) times daily.        Marland Kitchen LORazepam (ATIVAN) 0.5 MG tablet Take 0.5 mg by mouth every 8 (eight) hours.        Marland Kitchen MELATONIN PO Take 15 mg by mouth daily.      . methocarbamol (ROBAXIN) 500 MG tablet Take 500 mg by mouth 3 (three) times daily.      . montelukast (SINGULAIR) 10 MG tablet Take 10 mg by mouth daily.      . Multiple Vitamin (MULTIVITAMIN) capsule Take 1 capsule by mouth daily.        Marland Kitchen NITROSTAT 0.4 MG SL tablet Place 0.4 mg under the tongue every 15 (fifteen) minutes as needed.      Marland Kitchen  omeprazole (PRILOSEC) 40 MG capsule Take 40 mg by mouth 2 (two) times daily.        Marland Kitchen oxyCODONE-acetaminophen (PERCOCET) 5-325 MG per tablet Take 1 tablet by mouth 3 (three) times daily as needed.        . polyethylene glycol powder (GLYCOLAX/MIRALAX) powder Take 4 Containers by mouth daily.      Marland Kitchen PROAIR HFA 108 (90 BASE) MCG/ACT inhaler Inhale 2 puffs into the lungs as needed.      . topiramate (TOPAMAX) 100 MG tablet Take 100 mg by mouth daily.        Marland Kitchen triamterene-hydrochlorothiazide (DYAZIDE) 37.5-25 MG per capsule Take 1 capsule by mouth every morning.        . valACYclovir (VALTREX) 500 MG tablet Take 500 mg by mouth 2 (two) times daily.        . Vitamin D, Ergocalciferol, (DRISDOL) 50000 UNITS CAPS capsule Take 1 Units by mouth once a week.       No current facility-administered medications for this visit.    Allergies  Allergen Reactions  . Sulfa Antibiotics Shortness Of Breath, Swelling and Rash  . Methotrexate Derivatives Swelling and Rash    History   Social History    . Marital Status: Married    Spouse Name: N/A    Number of Children: N/A  . Years of Education: N/A   Occupational History  . Not on file.   Social History Main Topics  . Smoking status: Former Smoker    Types: Cigarettes  . Smokeless tobacco: Never Used  . Alcohol Use: No  . Drug Use: No  . Sexual Activity: Not on file   Other Topics Concern  . Not on file   Social History Narrative  . No narrative on file     Review of Systems: General: negative for chills, fever, night sweats or weight changes.  Cardiovascular: negative for chest pain, dyspnea on exertion, edema, orthopnea, palpitations, paroxysmal nocturnal dyspnea or shortness of breath Dermatological: negative for rash Respiratory: negative for cough or wheezing Urologic: negative for hematuria Abdominal: negative for nausea, vomiting, diarrhea, bright red blood per rectum, melena, or hematemesis Neurologic: negative for visual changes, syncope, or dizziness All other systems reviewed and are otherwise negative except as noted above.    Blood pressure 86/62, pulse 83, height 5' 8"  (1.727 m), weight 134 lb (60.782 kg).  General appearance: alert and no distress Neck: no adenopathy, no carotid bruit, no JVD, supple, symmetrical, trachea midline and thyroid not enlarged, symmetric, no tenderness/mass/nodules Lungs: clear to auscultation bilaterally Heart: regular rate and rhythm, S1, S2 normal, no murmur, click, rub or gallop Abdomen: soft, non-tender; bowel sounds normal; no masses,  no organomegaly Extremities: extremities normal, atraumatic, no cyanosis or edema and 2+ pedal pulses bilaterally  EKG normal sinus rhythm at 83 without ST or T wave changes  ASSESSMENT AND PLAN:   Chest pain Patient gets chest pain one to 2 times a month over the last 6 months. It is likely responsive and it has awakened her from sleep. It radiates to her neck and upper left upper extremity and is associated with shortness of breath  and diaphoresis. She had a normal stress test in the past and was referred for cardiac catheterization to define her anatomy given the positive cardiac risk factors of ongoing nitrate responsive chest pain.. Factors include family history, hyperlipidemia and over 40-pack-year history of tobacco abuse. She does have moderate carotid disease by duplex ultrasound.      Roderic Palau  Adora Fridge MD FACP,FACC,FAHA, [facility] 10/30/2013 3:27 PM

## 2013-11-07 NOTE — CV Procedure (Signed)
Kayla Ryan is a 58 y.o. female    [id number] LOCATION:  FACILITY: [facility]  PHYSICIAN: Quay Burow, M.D. 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 She was referred by Dr. Claudie Leach for cardiac catheterization because of ongoing nitrate responsive chest pain with positive cardiac factors despite a normal stress test. She has a 40-pack-year history of tobacco abuse having quit 2 years ago. She has dyslipidemia intolerant to statins as well as a family history of heart disease. She's developed chest pain over the last 6-8 months it occurs several times a month. It has awakened her from sleep. It radiates to her neck, left upper extremity and is associated with shortness of breath and diaphoresis. She apparently had a normal Myoview stress test however because of ongoing chest pain with positive factors her primary cardiologist, Dr. Claudie Leach, has requested a diagnostic cardiac catheterization to define her anatomy and rule out an ischemic etiology.    PROCEDURE DESCRIPTION:   The patient was brought to the second floor [facility] lab in the postabsorptive state. She was premedicated with Valium 5 mg by mouth, IV Versed and fentanyl. Her right wristwas prepped and shaved in usual sterile fashion. Xylocaine 1% was used for local anesthesia. A 6 French sheath was inserted into the right radial artery using standard Seldinger technique. The patient received 3000 units  of heparin  intravenously.  Using a 5 [place] TIG catheter and pigtail catheter selective coronary angiography and left ventriculography were performed. Visipaque dye was used for the entirety of the case. Retrograde aorta, left ventricular and the pullback pressures were  recorded.    HEMODYNAMICS:    AO SYSTOLIC/AO DIASTOLIC: 415/83   LV SYSTOLIC/LV DIASTOLIC: 094/07  ANGIOGRAPHIC RESULTS:   1. Left main; normal  2. LAD; normal 3. Left circumflex; nondominant and normal. There was a moderate size ramus branch which was normal as well.  4. Right coronary artery; dominant and normal 5. Left ventriculography; RAO left ventriculogram was performed using  25 mL of Visipaque dye at 12 mL/second. The overall LVEF estimated  60 % Without wall motion abnormalities  IMPRESSION:[name] has normal coronary arteries and normal left ventricular function. I believe her chest pain is noncardiac. Her Myoview stress test result was correct. The sheath was removed and a TR and was placed on the right wrist to achieve patent hemostasis. The patient left the lab in stable condition. She'll be discharged home in 2 hours and will followup with Dr. Claudie Leach.  Lorretta Harp MD, [facility] 11/07/2013 8:26 AM

## 2014-04-29 DIAGNOSIS — G510 Bell's palsy: Secondary | ICD-10-CM | POA: Insufficient documentation

## 2014-05-18 DIAGNOSIS — I3489 Other nonrheumatic mitral valve disorders: Secondary | ICD-10-CM | POA: Insufficient documentation

## 2014-05-18 DIAGNOSIS — F411 Generalized anxiety disorder: Secondary | ICD-10-CM | POA: Insufficient documentation

## 2014-05-18 DIAGNOSIS — K5904 Chronic idiopathic constipation: Secondary | ICD-10-CM | POA: Insufficient documentation

## 2014-05-18 DIAGNOSIS — M3390 Dermatopolymyositis, unspecified, organ involvement unspecified: Secondary | ICD-10-CM | POA: Insufficient documentation

## 2014-05-18 DIAGNOSIS — F419 Anxiety disorder, unspecified: Secondary | ICD-10-CM | POA: Insufficient documentation

## 2014-05-18 DIAGNOSIS — J309 Allergic rhinitis, unspecified: Secondary | ICD-10-CM | POA: Insufficient documentation

## 2014-05-18 DIAGNOSIS — G43909 Migraine, unspecified, not intractable, without status migrainosus: Secondary | ICD-10-CM | POA: Insufficient documentation

## 2014-05-18 DIAGNOSIS — G629 Polyneuropathy, unspecified: Secondary | ICD-10-CM | POA: Insufficient documentation

## 2014-05-18 DIAGNOSIS — K5909 Other constipation: Secondary | ICD-10-CM | POA: Insufficient documentation

## 2014-05-18 DIAGNOSIS — I348 Other nonrheumatic mitral valve disorders: Secondary | ICD-10-CM | POA: Insufficient documentation

## 2014-05-18 DIAGNOSIS — N816 Rectocele: Secondary | ICD-10-CM | POA: Insufficient documentation

## 2014-05-18 DIAGNOSIS — M1990 Unspecified osteoarthritis, unspecified site: Secondary | ICD-10-CM | POA: Insufficient documentation

## 2014-05-18 DIAGNOSIS — J45909 Unspecified asthma, uncomplicated: Secondary | ICD-10-CM | POA: Insufficient documentation

## 2014-05-24 ENCOUNTER — Encounter (HOSPITAL_BASED_OUTPATIENT_CLINIC_OR_DEPARTMENT_OTHER): Payer: Self-pay | Admitting: Emergency Medicine

## 2014-05-24 ENCOUNTER — Emergency Department (HOSPITAL_BASED_OUTPATIENT_CLINIC_OR_DEPARTMENT_OTHER)
Admission: EM | Admit: 2014-05-24 | Discharge: 2014-05-24 | Disposition: A | Discharge status: Other Acute Inpatient Hospital | Payer: Medicare HMO | Attending: Emergency Medicine | Admitting: Emergency Medicine

## 2014-05-24 ENCOUNTER — Emergency Department (HOSPITAL_BASED_OUTPATIENT_CLINIC_OR_DEPARTMENT_OTHER): Payer: Medicare HMO

## 2014-05-24 DIAGNOSIS — D649 Anemia, unspecified: Secondary | ICD-10-CM | POA: Diagnosis present

## 2014-05-24 DIAGNOSIS — D500 Iron deficiency anemia secondary to blood loss (chronic): Secondary | ICD-10-CM | POA: Insufficient documentation

## 2014-05-24 DIAGNOSIS — Z79899 Other long term (current) drug therapy: Secondary | ICD-10-CM | POA: Diagnosis not present

## 2014-05-24 DIAGNOSIS — R079 Chest pain, unspecified: Secondary | ICD-10-CM | POA: Diagnosis not present

## 2014-05-24 DIAGNOSIS — E785 Hyperlipidemia, unspecified: Secondary | ICD-10-CM | POA: Insufficient documentation

## 2014-05-24 DIAGNOSIS — G43909 Migraine, unspecified, not intractable, without status migrainosus: Secondary | ICD-10-CM | POA: Insufficient documentation

## 2014-05-24 DIAGNOSIS — M1990 Unspecified osteoarthritis, unspecified site: Secondary | ICD-10-CM | POA: Insufficient documentation

## 2014-05-24 DIAGNOSIS — K5090 Crohn's disease, unspecified, without complications: Secondary | ICD-10-CM | POA: Diagnosis not present

## 2014-05-24 DIAGNOSIS — R1084 Generalized abdominal pain: Secondary | ICD-10-CM | POA: Insufficient documentation

## 2014-05-24 DIAGNOSIS — Z8619 Personal history of other infectious and parasitic diseases: Secondary | ICD-10-CM | POA: Insufficient documentation

## 2014-05-24 DIAGNOSIS — J449 Chronic obstructive pulmonary disease, unspecified: Secondary | ICD-10-CM | POA: Diagnosis not present

## 2014-05-24 DIAGNOSIS — Z7982 Long term (current) use of aspirin: Secondary | ICD-10-CM | POA: Insufficient documentation

## 2014-05-24 DIAGNOSIS — Z7951 Long term (current) use of inhaled steroids: Secondary | ICD-10-CM | POA: Insufficient documentation

## 2014-05-24 DIAGNOSIS — Z87891 Personal history of nicotine dependence: Secondary | ICD-10-CM | POA: Insufficient documentation

## 2014-05-24 HISTORY — DX: Plantar fascial fibromatosis: M72.2

## 2014-05-24 LAB — TROPONIN I: Troponin I: <0.3 ng/mL (ref <0.30)

## 2014-05-24 LAB — CBC WITH DIFFERENTIAL/PLATELET
Basophils Absolute: 0.1 K/uL (ref 0.0–0.1)
Basophils Relative: 1 % (ref 0–1)
Eosinophils Absolute: 0.2 K/uL (ref 0.0–0.7)
Eosinophils Relative: 3 % (ref 0–5)
HCT: 23.6 % — ABNORMAL LOW (ref 36.0–46.0)
Hemoglobin: 7 g/dL — ABNORMAL LOW (ref 12.0–15.0)
Lymphocytes Relative: 33 % (ref 12–46)
Lymphs Abs: 3 K/uL (ref 0.7–4.0)
MCH: 25.5 pg — ABNORMAL LOW (ref 26.0–34.0)
MCHC: 29.7 g/dL — ABNORMAL LOW (ref 30.0–36.0)
MCV: 85.8 fL (ref 78.0–100.0)
Monocytes Absolute: 0.7 K/uL (ref 0.1–1.0)
Monocytes Relative: 8 % (ref 3–12)
Neutro Abs: 5.2 K/uL (ref 1.7–7.7)
Neutrophils Relative %: 55 % (ref 43–77)
Platelets: 723 K/uL — ABNORMAL HIGH (ref 150–400)
RBC: 2.75 MIL/uL — ABNORMAL LOW (ref 3.87–5.11)
RDW: 14 % (ref 11.5–15.5)
WBC: 9.2 K/uL (ref 4.0–10.5)

## 2014-05-24 LAB — COMPREHENSIVE METABOLIC PANEL
ALBUMIN: 4 g/dL (ref 3.5–5.2)
ALT: 19 U/L (ref 0–35)
AST: 22 U/L (ref 0–37)
Alkaline Phosphatase: 83 U/L (ref 39–117)
Anion gap: 13 (ref 5–15)
BUN: 17 mg/dL (ref 6–23)
CO2: 27 mEq/L (ref 19–32)
CREATININE: 0.9 mg/dL (ref 0.50–1.10)
Calcium: 9.2 mg/dL (ref 8.4–10.5)
Chloride: 100 mEq/L (ref 96–112)
GFR calc Af Amer: 79 mL/min — ABNORMAL LOW (ref >90)
GFR calc non Af Amer: 68 mL/min — ABNORMAL LOW (ref >90)
GLUCOSE: 107 mg/dL — AB (ref 70–99)
Potassium: 3 mEq/L — ABNORMAL LOW (ref 3.7–5.3)
Sodium: 140 mEq/L (ref 137–147)
TOTAL PROTEIN: 8 g/dL (ref 6.0–8.3)

## 2014-05-24 LAB — PROTIME-INR
INR: 1.08 (ref 0.00–1.49)
Prothrombin Time: 14 s (ref 11.6–15.2)

## 2014-05-24 LAB — OCCULT BLOOD X 1 CARD TO LAB, STOOL: Fecal Occult Bld: POSITIVE — AB

## 2014-05-24 NOTE — ED Provider Notes (Signed)
CSN: [id number]     Arrival date & time 05/24/14  1914 History  This chart was scribed for Ezequiel Essex, MD by Peyton Bottoms, ED Scribe. This patient was seen in room MH07/MH07 and the patient's care was started at 8:00 PM.   Chief Complaint  Patient presents with  . Anemia   Patient is a 58 y.o. female presenting with anemia. The history is provided by the patient. No language interpreter was used.  Anemia   HPI Comments: Kayla Ryan is a 58 y.o. female with a history of Crohn Disease, IBS, Fibromyalgia, who presents to the Emergency Department complaining of anemia. Was told by PCP to come to the ER to get blood level checked. Patient states that she has fallen multiple times in the past 2 weeks. Patient reports associated nausea, dizziness, lightheadedness, and intermittent chest pain. Patient states that she takes nitroglycerine which helps with chest pain. She currently states that she "can hear my heart in my ears". Patient has no history of getting a blood transfusion. Patient also reports associated abdominal pain. Patient reports history of diverticula but no history of ulcers. Patient was taking 1 baby aspirin daily but began taking 3 baby aspirins since yesterday per PCP orders. Patient denies any history of heart failure.   Past Medical History  Diagnosis Date  . Crohn disease   . IBS (irritable bowel syndrome)   . Arthritis   . Fibromyalgia   . COPD (chronic obstructive pulmonary disease)   . Chronic bronchitis   . Emphysema   . Muscular dystrophy   . Herpes   . Migraines   . Chest pain   . Hyperlipidemia   . Plantar fasciitis    Past Surgical History  Procedure Laterality Date  . Hernia repair    . Abdominal hysterectomy    . Mastectomy partial / lumpectomy    . Rotator cuff repair    . Bone spur    . Oophorectomy    . Cholecystectomy     No family history on file. History  Substance Use Topics  . Smoking status: Former Smoker    Types: Cigarettes   . Smokeless tobacco: Never Used  . Alcohol Use: No   OB History   Grav Para Term Preterm Abortions TAB SAB Ect Mult Living                 Review of Systems  A complete 10 system review of systems was obtained and all systems are negative except as noted in the HPI and PMH.   Allergies  Sulfa antibiotics; Dilaudid; Methotrexate derivatives; and Morphine and related  Home Medications   Prior to Admission medications   Medication Sig Start Date End Date Taking? Authorizing Provider  AMITIZA 24 MCG capsule Take 24 mcg by mouth 2 (two) times daily. 10/06/13   Historical Provider, MD  aspirin 81 MG tablet Take 81 mg by mouth daily.    Historical Provider, MD  co-enzyme Q-10 30 MG capsule Take 30 mg by mouth daily.      Historical Provider, MD  diclofenac sodium (VOLTAREN) 1 % GEL Apply 2 g topically 4 (four) times daily as needed (for pain).    Historical Provider, MD  doxepin (SINEQUAN) 10 MG capsule Take 10 mg by mouth at bedtime. 10/11/13   Historical Provider, MD  DULoxetine (CYMBALTA) 60 MG capsule Take 60 mg by mouth 2 (two) times daily. 10/11/13   Historical Provider, MD  estradiol (ESTRACE) 1 MG tablet Take 1 mg  by mouth daily. 10/05/13   Historical Provider, MD  fentaNYL (DURAGESIC - DOSED MCG/HR) 50 MCG/HR Place 1 patch onto the skin every other day.     Historical Provider, MD  fluticasone (FLONASE) 50 MCG/ACT nasal spray Place 1 spray into both nostrils daily as needed for allergies.  10/11/13   Historical Provider, MD  Fluticasone Furoate-Vilanterol (BREO ELLIPTA) 100-25 MCG/INH AEPB Inhale 1 puff into the lungs daily.    Historical Provider, MD  gabapentin (NEURONTIN) 300 MG capsule Take 600 mg by mouth 3 (three) times daily.     Historical Provider, MD  gemfibrozil (LOPID) 600 MG tablet Take 600 mg by mouth 2 (two) times daily before a meal.      Historical Provider, MD  isosorbide mononitrate (IMDUR) 30 MG 24 hr tablet Take 30 mg by mouth daily. 10/06/13   Historical Provider, MD   l-methylfolate-B6-B12 (METANX) 3-35-2 MG TABS Take 1 tablet by mouth daily.    Historical Provider, MD  lidocaine (LIDODERM) 5 % Place 1-2 patches onto the skin daily. Remove & Discard patch within 12 hours or as directed by MD    Historical Provider, MD  loratadine (CLARITIN) 10 MG tablet Take 10 mg by mouth 2 (two) times daily.      Historical Provider, MD  LORazepam (ATIVAN) 0.5 MG tablet Take 0.5 mg by mouth 3 (three) times daily.     Historical Provider, MD  magnesium hydroxide (MILK OF MAGNESIA) 400 MG/5ML suspension Take 15 mLs by mouth at bedtime.    Historical Provider, MD  Melatonin (MELATONIN TR) 10 MG TBCR Take 10 mg by mouth at bedtime.    Historical Provider, MD  Melatonin 5 MG TABS Take 5 mg by mouth at bedtime.    Historical Provider, MD  MELATONIN ER PO Take by mouth.    Historical Provider, MD  methocarbamol (ROBAXIN) 500 MG tablet Take 500 mg by mouth 3 (three) times daily. 08/12/13   Historical Provider, MD  montelukast (SINGULAIR) 10 MG tablet Take 10 mg by mouth daily. 10/11/13   Historical Provider, MD  Multiple Vitamin (MULTIVITAMIN) capsule Take 1 capsule by mouth daily.      Historical Provider, MD  NITROSTAT 0.4 MG SL tablet Place 0.4 mg under the tongue every 15 (fifteen) minutes as needed for chest pain.  09/22/13   Historical Provider, MD  omeprazole (PRILOSEC) 40 MG capsule Take 40 mg by mouth 2 (two) times daily.      Historical Provider, MD  oxyCODONE-acetaminophen (PERCOCET) 10-325 MG per tablet Take 1 tablet by mouth every 8 (eight) hours as needed for pain.    Historical Provider, MD  polyethylene glycol powder (GLYCOLAX/MIRALAX) powder Take 4 Containers by mouth daily. 09/23/13   Historical Provider, MD  PROAIR HFA 108 (90 BASE) MCG/ACT inhaler Inhale 2 puffs into the lungs every 4 (four) hours as needed for wheezing.  10/11/13   Historical Provider, MD  topiramate (TOPAMAX) 100 MG tablet Take 100 mg by mouth daily.      Historical Provider, MD   triamterene-hydrochlorothiazide (DYAZIDE) 37.5-25 MG per capsule Take 1 capsule by mouth every morning.      Historical Provider, MD  valACYclovir (VALTREX) 500 MG tablet Take 500 mg by mouth 2 (two) times daily.      Historical Provider, MD  Vitamin D, Ergocalciferol, (DRISDOL) 50000 UNITS CAPS capsule Take 1 Units by mouth once a week. 10/18/13   Historical Provider, MD   Triage Vitals: BP 111/55  Pulse 79  Temp(Src) 98.9 F (37.2  C) (Oral)  Resp 18  Ht 5' 8"  (1.727 m)  Wt 135 lb (61.236 kg)  BMI 20.53 kg/m2  SpO2 100%  Physical Exam  Nursing note and vitals reviewed. Constitutional: She is oriented to person, place, and time. She appears well-developed and well-nourished. No distress.  Pale appearing, pale conjunctiva.   HENT:  Head: Normocephalic and atraumatic.  Mouth/Throat: Oropharynx is clear and moist. No oropharyngeal exudate.  Eyes: Conjunctivae and EOM are normal. Pupils are equal, round, and reactive to light.  Neck: Normal range of motion. Neck supple.  No meningismus.  Cardiovascular: Normal rate, regular rhythm, normal heart sounds and intact distal pulses.   No murmur heard. Pulmonary/Chest: Effort normal and breath sounds normal. No respiratory distress.  Abdominal: Soft. There is no tenderness. There is no rebound and no guarding.  Diffusely tender.  Genitourinary:  No hemorrhoids. No gross blood. Chaperone present.  Musculoskeletal: Normal range of motion. She exhibits no edema and no tenderness.  Neurological: She is alert and oriented to person, place, and time. No cranial nerve deficit. She exhibits normal muscle tone. Coordination normal.  No ataxia on finger to nose bilaterally. No pronator drift. 5/5 strength throughout. CN 2-12 intact. Negative Romberg. Equal grip strength. Sensation intact. Gait is normal.   Skin: Skin is warm.  Psychiatric: She has a normal mood and affect. Her behavior is normal.   ED Course  Procedures (including critical care  time)  DIAGNOSTIC STUDIES: Oxygen Saturation is 100% on RA, normal by my interpretation.    COORDINATION OF CARE: 11:49 PM- Discussed plans to order diagnostic lab work to check blood levels. Pt advised of plan for treatment and pt agrees.  Labs Review Labs Reviewed  CBC WITH DIFFERENTIAL - Abnormal; Notable for the following:    RBC 2.75 (*)    Hemoglobin 7.0 (*)    HCT 23.6 (*)    MCH 25.5 (*)    MCHC 29.7 (*)    Platelets 723 (*)    All other components within normal limits  COMPREHENSIVE METABOLIC PANEL - Abnormal; Notable for the following:    Potassium 3.0 (*)    Glucose, Bld 107 (*)    Total Bilirubin <0.2 (*)    GFR calc non Af Amer 68 (*)    GFR calc Af Amer 79 (*)    All other components within normal limits  OCCULT BLOOD X 1 CARD TO LAB, STOOL - Abnormal; Notable for the following:    Fecal Occult Bld POSITIVE (*)    All other components within normal limits  PROTIME-INR  TROPONIN I  POC OCCULT BLOOD, ED    Imaging Review Dg Chest 2 View  05/24/2014   CLINICAL DATA:  Nausea dizziness lightheadedness intermittent chest pain and anemia for 2 weeks, initial evaluation  EXAM: CHEST  2 VIEW  COMPARISON:  12/15/2008  FINDINGS: Heart size and vascular pattern normal. Lungs clear. No effusion. Mild hyperinflation. This suggests COPD.  IMPRESSION: No acute finding   Electronically Signed   By: Skipper Cliche M.D.   On: 05/24/2014 21:04     EKG Interpretation None     MDM   Final diagnoses:  Iron deficiency anemia due to chronic blood loss   Patient referred to ED by PCP for low hemoglobin. States it was 6. Denies any blood in the stool but has been dark. Endorses generalized weakness, lightheadedness and dizziness. No history of blood transfusions in the past.  Hemoglobin 7. Records from [facility] show hemoglobin was 6.3  on lab draw 10/16. Fecal occult blood was positive. She endorses dark stools.  States she's had EGD and colonoscopy in the past  but does not know the results. Results not available. Denies any history of ulcers or excessive NSAID use.  Her EKG is stable. She had a clean catheterization in April.  She'll be transferred to [facility] for blood transfusion and further workup. Discussed with Dr. Mal Amabile.  Vitals stable at time of transfer.  BP 131/74  Pulse 87  Temp(Src) 98.7 F (37.1 C) (Oral)  Resp 20  Ht 5' 8"  (1.727 m)  Wt 135 lb (61.236 kg)  BMI 20.53 kg/m2  SpO2 100%   Date: 05/24/2014  Rate: 76  Rhythm: normal sinus rhythm  QRS Axis: normal  Intervals: normal  ST/T Wave abnormalities: nonspecific ST/T changes  Conduction Disutrbances:none  Narrative Interpretation:   Old EKG Reviewed: unchanged   I personally performed the services described in this documentation, which was scribed in my presence. The recorded information has been reviewed and is accurate.  Ezequiel Essex, MD 05/24/14 2350

## 2014-05-24 NOTE — ED Notes (Signed)
Pt to xray

## 2014-05-24 NOTE — ED Notes (Signed)
Pt sts her pmd called her today and told her to come to the ED for low hgb.  Sts they checked it Monday and again Friday and it was lower on the second check.  Pt sts pmd faxed his records here.

## 2014-07-17 ENCOUNTER — Encounter (HOSPITAL_COMMUNITY): Payer: Self-pay | Admitting: Cardiovascular Disease

## 2015-03-03 DIAGNOSIS — G5139 Clonic hemifacial spasm, unspecified: Secondary | ICD-10-CM | POA: Insufficient documentation

## 2015-03-03 DIAGNOSIS — Z8669 Personal history of other diseases of the nervous system and sense organs: Secondary | ICD-10-CM | POA: Insufficient documentation

## 2015-03-03 DIAGNOSIS — H2513 Age-related nuclear cataract, bilateral: Secondary | ICD-10-CM | POA: Insufficient documentation

## 2016-02-03 ENCOUNTER — Other Ambulatory Visit: Payer: Self-pay

## 2016-02-03 DIAGNOSIS — Z1231 Encounter for screening mammogram for malignant neoplasm of breast: Secondary | ICD-10-CM

## 2016-02-13 ENCOUNTER — Other Ambulatory Visit: Payer: Self-pay | Admitting: Physician Assistant

## 2016-02-13 DIAGNOSIS — N6310 Unspecified lump in the right breast, unspecified quadrant: Secondary | ICD-10-CM

## 2016-02-13 DIAGNOSIS — N6320 Unspecified lump in the left breast, unspecified quadrant: Principal | ICD-10-CM

## 2016-02-19 ENCOUNTER — Other Ambulatory Visit: Payer: Medicare HMO

## 2016-02-19 ENCOUNTER — Ambulatory Visit
Admission: RE | Admit: 2016-02-19 | Discharge: 2016-02-19 | Disposition: A | Discharge status: Home / Self Care | Payer: Medicare HMO | Source: Ambulatory Visit | Attending: Physician Assistant | Admitting: Physician Assistant

## 2016-02-19 DIAGNOSIS — N6320 Unspecified lump in the left breast, unspecified quadrant: Principal | ICD-10-CM

## 2016-02-19 DIAGNOSIS — N6310 Unspecified lump in the right breast, unspecified quadrant: Secondary | ICD-10-CM

## 2016-03-25 DIAGNOSIS — D500 Iron deficiency anemia secondary to blood loss (chronic): Secondary | ICD-10-CM | POA: Insufficient documentation

## 2016-03-25 DIAGNOSIS — D509 Iron deficiency anemia, unspecified: Secondary | ICD-10-CM | POA: Insufficient documentation

## 2016-07-17 DIAGNOSIS — I10 Essential (primary) hypertension: Secondary | ICD-10-CM | POA: Insufficient documentation

## 2016-10-06 DIAGNOSIS — R1310 Dysphagia, unspecified: Secondary | ICD-10-CM | POA: Insufficient documentation

## 2016-10-06 DIAGNOSIS — R1319 Other dysphagia: Secondary | ICD-10-CM | POA: Insufficient documentation

## 2016-10-18 ENCOUNTER — Other Ambulatory Visit: Payer: Self-pay | Admitting: General Surgery

## 2016-10-18 DIAGNOSIS — N6012 Diffuse cystic mastopathy of left breast: Secondary | ICD-10-CM

## 2016-11-11 ENCOUNTER — Other Ambulatory Visit: Payer: Self-pay | Admitting: General Surgery

## 2016-11-11 ENCOUNTER — Ambulatory Visit
Admission: RE | Admit: 2016-11-11 | Discharge: 2016-11-11 | Disposition: A | Discharge status: Home / Self Care | Payer: Medicare HMO | Source: Ambulatory Visit | Attending: General Surgery | Admitting: General Surgery

## 2016-11-11 DIAGNOSIS — N6012 Diffuse cystic mastopathy of left breast: Secondary | ICD-10-CM

## 2016-11-11 DIAGNOSIS — N6320 Unspecified lump in the left breast, unspecified quadrant: Secondary | ICD-10-CM

## 2016-11-15 ENCOUNTER — Ambulatory Visit
Admission: RE | Admit: 2016-11-15 | Discharge: 2016-11-15 | Disposition: A | Discharge status: Home / Self Care | Payer: Medicare HMO | Source: Ambulatory Visit | Attending: General Surgery | Admitting: General Surgery

## 2016-11-15 ENCOUNTER — Other Ambulatory Visit: Payer: Self-pay | Admitting: General Surgery

## 2016-11-15 DIAGNOSIS — N6320 Unspecified lump in the left breast, unspecified quadrant: Secondary | ICD-10-CM

## 2016-11-17 HISTORY — PX: BREAST BIOPSY: SHX20

## 2017-02-01 DIAGNOSIS — F5101 Primary insomnia: Secondary | ICD-10-CM | POA: Insufficient documentation

## 2017-04-20 DIAGNOSIS — R110 Nausea: Secondary | ICD-10-CM | POA: Insufficient documentation

## 2017-05-22 DIAGNOSIS — K219 Gastro-esophageal reflux disease without esophagitis: Secondary | ICD-10-CM | POA: Insufficient documentation

## 2017-07-26 ENCOUNTER — Emergency Department (HOSPITAL_BASED_OUTPATIENT_CLINIC_OR_DEPARTMENT_OTHER): Payer: Medicare HMO

## 2017-07-26 ENCOUNTER — Other Ambulatory Visit: Payer: Self-pay

## 2017-07-26 ENCOUNTER — Emergency Department (HOSPITAL_BASED_OUTPATIENT_CLINIC_OR_DEPARTMENT_OTHER)
Admission: EM | Admit: 2017-07-26 | Discharge: 2017-07-26 | Disposition: A | Discharge status: Home / Self Care | Payer: Medicare HMO | Attending: Emergency Medicine | Admitting: Emergency Medicine

## 2017-07-26 ENCOUNTER — Encounter (HOSPITAL_BASED_OUTPATIENT_CLINIC_OR_DEPARTMENT_OTHER): Payer: Self-pay | Admitting: Emergency Medicine

## 2017-07-26 DIAGNOSIS — W19XXXA Unspecified fall, initial encounter: Secondary | ICD-10-CM | POA: Diagnosis not present

## 2017-07-26 DIAGNOSIS — Z87891 Personal history of nicotine dependence: Secondary | ICD-10-CM | POA: Diagnosis not present

## 2017-07-26 DIAGNOSIS — I259 Chronic ischemic heart disease, unspecified: Secondary | ICD-10-CM | POA: Diagnosis not present

## 2017-07-26 DIAGNOSIS — Y999 Unspecified external cause status: Secondary | ICD-10-CM | POA: Insufficient documentation

## 2017-07-26 DIAGNOSIS — Y939 Activity, unspecified: Secondary | ICD-10-CM | POA: Insufficient documentation

## 2017-07-26 DIAGNOSIS — J449 Chronic obstructive pulmonary disease, unspecified: Secondary | ICD-10-CM | POA: Insufficient documentation

## 2017-07-26 DIAGNOSIS — Y929 Unspecified place or not applicable: Secondary | ICD-10-CM | POA: Diagnosis not present

## 2017-07-26 DIAGNOSIS — S4992XA Unspecified injury of left shoulder and upper arm, initial encounter: Secondary | ICD-10-CM | POA: Diagnosis not present

## 2017-07-26 DIAGNOSIS — S6992XA Unspecified injury of left wrist, hand and finger(s), initial encounter: Secondary | ICD-10-CM | POA: Diagnosis not present

## 2017-07-26 DIAGNOSIS — M25531 Pain in right wrist: Secondary | ICD-10-CM | POA: Diagnosis not present

## 2017-07-26 DIAGNOSIS — S4991XA Unspecified injury of right shoulder and upper arm, initial encounter: Secondary | ICD-10-CM

## 2017-07-26 HISTORY — DX: Scoliosis, unspecified: M41.9

## 2017-07-26 HISTORY — DX: Cervicalgia: M54.2

## 2017-07-26 NOTE — Discharge Instructions (Signed)
Please read and follow all provided instructions.  Your diagnoses today include:  1. Injury of right shoulder, initial encounter   2. Right wrist pain     Tests performed today include:  An x-ray of the affected area - does NOT show any broken bones  Vital signs. See below for your results today.   Medications prescribed:   None  Take any prescribed medications only as directed.  Home care instructions:   Follow any educational materials contained in this packet  Follow R.I.C.E. Protocol:  R - rest your injury   I  - use ice on injury without applying directly to skin  C - compress injury with bandage or splint  E - elevate the injury as much as possible  Follow-up instructions: Please follow-up with your primary care provider or the provided orthopedic physician (bone specialist) if you continue to have significant pain in 1 week. In this case you may have a more severe injury that requires further care.   Return instructions:   Please return if your fingers are numb or tingling, appear gray or blue, or you have severe pain (also elevate the arm and loosen splint or wrap if you were given one)  Please return to the Emergency Department if you experience worsening symptoms.   Please return if you have any other emergent concerns.  Additional Information:  Your vital signs today were: BP (!) 149/91 (BP Location: Right Arm)    Pulse 88    Temp 98.8 F (37.1 C) (Oral)    Resp 16    Ht 5' 8"  (1.727 m)    Wt 66.7 kg (147 lb)    SpO2 97%    BMI 22.35 kg/m  If your blood pressure (BP) was elevated above 135/85 this visit, please have this repeated by your doctor within one month. --------------

## 2017-07-26 NOTE — ED Triage Notes (Signed)
Fell 5 days ago. Pain in right shoulder ever since.  Sts "I think I have a torn rotator cuff".  Takes Percocet for chronic pain issues and has not had it since 9am.  Sts "it helps a little."

## 2017-07-26 NOTE — ED Notes (Signed)
Patient transported to X-ray 

## 2017-07-26 NOTE — ED Notes (Signed)
ED Provider at bedside. 

## 2017-07-26 NOTE — ED Provider Notes (Signed)
[facility] EMERGENCY DEPARTMENT Provider Note   CSN: [id number] Arrival date & time: 07/26/17  1536     History   Chief Complaint Chief Complaint  Patient presents with  . Shoulder Pain    HPI Kayla Ryan is a 61 y.o. female.  Patient presents with complaint of left shoulder and left wrist pain after a fall 3-4 days ago.  Patient states that she has muscular dystrophy and sometimes feels weak and her leg went out from underneath her.  She did not feel lightheaded or faint.  Patient has been treating her wrist pain at home with a splint which has helped.  Shoulder pain has gotten worse.  No numbness or tingling of her extremity.  No neck pain or headache.  No vision change or vomiting.  Patient takes chronic Percocet at home which has not really been helping. Pain is worse with palpation and movement.  Onset is acute.      Past Medical History:  Diagnosis Date  . Arthritis   . Chest pain   . Chronic bronchitis   . COPD (chronic obstructive pulmonary disease) ([place])   . Crohn disease ([place])   . Emphysema   . Fibromyalgia   . Herpes   . Hyperlipidemia   . IBS (irritable bowel syndrome)   . Migraines   . Muscular dystrophy   . Neck pain   . Plantar fasciitis   . Scoliosis     Patient Active Problem List   Diagnosis Date Noted  . Chest pain 10/30/2013  . Hyperlipidemia 10/30/2013  . Carotid artery disease ([place]) 10/30/2013    Past Surgical History:  Procedure Laterality Date  . ABDOMINAL HYSTERECTOMY    . bone spur    . CHOLECYSTECTOMY    . HERNIA REPAIR    . LEFT HEART CATHETERIZATION WITH CORONARY ANGIOGRAM N/A 11/07/2013   Procedure: LEFT HEART CATHETERIZATION WITH CORONARY ANGIOGRAM;  Surgeon: Lorretta Harp, MD;  Location: [facility] CATH LAB;  Service: Cardiovascular;  Laterality: N/A;  . MASTECTOMY PARTIAL / LUMPECTOMY    . OOPHORECTOMY    . ROTATOR CUFF REPAIR      OB History    No data available       Home Medications    Prior to  Admission medications   Medication Sig Start Date End Date Taking? Authorizing Provider  AMITIZA 24 MCG capsule Take 24 mcg by mouth 2 (two) times daily. 10/06/13   [provider]  aspirin 81 MG tablet Take 81 mg by mouth daily.    [provider]  co-enzyme Q-10 30 MG capsule Take 30 mg by mouth daily.      [provider]  diclofenac sodium (VOLTAREN) 1 % GEL Apply 2 g topically 4 (four) times daily as needed (for pain).    [provider]  doxepin (SINEQUAN) 10 MG capsule Take 10 mg by mouth at bedtime. 10/11/13   [provider]  DULoxetine (CYMBALTA) 60 MG capsule Take 60 mg by mouth 2 (two) times daily. 10/11/13   [provider]  estradiol (ESTRACE) 1 MG tablet Take 1 mg by mouth daily. 10/05/13   [provider]  fentaNYL (DURAGESIC - DOSED MCG/HR) 50 MCG/HR Place 1 patch onto the skin every other day.     [provider]  fluticasone (FLONASE) 50 MCG/ACT nasal spray Place 1 spray into both nostrils daily as needed for allergies.  10/11/13   [provider]  Fluticasone Furoate-Vilanterol (BREO ELLIPTA) 100-25 MCG/INH AEPB Inhale  1 puff into the lungs daily.    [provider]  gabapentin (NEURONTIN) 300 MG capsule Take 600 mg by mouth 3 (three) times daily.     [provider]  gemfibrozil (LOPID) 600 MG tablet Take 600 mg by mouth 2 (two) times daily before a meal.      [provider]  isosorbide mononitrate (IMDUR) 30 MG 24 hr tablet Take 30 mg by mouth daily. 10/06/13   [provider]  l-methylfolate-B6-B12 (METANX) 3-35-2 MG TABS Take 1 tablet by mouth daily.    [provider]  lidocaine (LIDODERM) 5 % Place 1-2 patches onto the skin daily. Remove & Discard patch within 12 hours or as directed by MD    [provider]  loratadine (CLARITIN) 10 MG tablet Take 10 mg by mouth 2 (two) times daily.      [provider]  LORazepam (ATIVAN) 0.5 MG tablet  Take 0.5 mg by mouth 3 (three) times daily.     [provider]  magnesium hydroxide (MILK OF MAGNESIA) 400 MG/5ML suspension Take 15 mLs by mouth at bedtime.    [provider]  Melatonin (MELATONIN TR) 10 MG TBCR Take 10 mg by mouth at bedtime.    [provider]  Melatonin 5 MG TABS Take 5 mg by mouth at bedtime.    [provider]  MELATONIN ER PO Take by mouth.    [provider]  methocarbamol (ROBAXIN) 500 MG tablet Take 500 mg by mouth 3 (three) times daily. 08/12/13   [provider]  montelukast (SINGULAIR) 10 MG tablet Take 10 mg by mouth daily. 10/11/13   [provider]  Multiple Vitamin (MULTIVITAMIN) capsule Take 1 capsule by mouth daily.      [provider]  NITROSTAT 0.4 MG SL tablet Place 0.4 mg under the tongue every 15 (fifteen) minutes as needed for chest pain.  09/22/13   [provider]  omeprazole (PRILOSEC) 40 MG capsule Take 40 mg by mouth 2 (two) times daily.      [provider]  oxyCODONE-acetaminophen (PERCOCET) 10-325 MG per tablet Take 1 tablet by mouth every 8 (eight) hours as needed for pain.    [provider]  polyethylene glycol powder (GLYCOLAX/MIRALAX) powder Take 4 Containers by mouth daily. 09/23/13   [provider]  PROAIR HFA 108 (90 BASE) MCG/ACT inhaler Inhale 2 puffs into the lungs every 4 (four) hours as needed for wheezing.  10/11/13   [provider]  topiramate (TOPAMAX) 100 MG tablet Take 100 mg by mouth daily.      [provider]  triamterene-hydrochlorothiazide (DYAZIDE) 37.5-25 MG per capsule Take 1 capsule by mouth every morning.      [provider]  valACYclovir (VALTREX) 500 MG tablet Take 500 mg by mouth 2 (two) times daily.      [provider]  Vitamin D, Ergocalciferol, (DRISDOL) 50000 UNITS CAPS capsule Take 1 Units by mouth once a week. 10/18/13   [provider]    Family History No  family history on file.  Social History Social History   Tobacco Use  . Smoking status: Former Smoker    Types: Cigarettes  . Smokeless tobacco: Never Used  Substance Use Topics  . Alcohol use: No  . Drug use: No     Allergies   Sulfa antibiotics; Dilaudid [hydromorphone hcl]; Methotrexate derivatives; and Morphine and related   Review of Systems Review of Systems  Constitutional: Negative for activity  change.  Musculoskeletal: Positive for arthralgias and myalgias. Negative for back pain, joint swelling and neck pain.  Skin: Negative for wound.  Neurological: Negative for weakness and numbness.     Physical Exam Updated Vital Signs BP (!) 149/91 (BP Location: Right Arm)   Pulse 88   Temp 98.8 F (37.1 C) (Oral)   Resp 16   Ht 5' 8"  (1.727 m)   Wt 66.7 kg (147 lb)   SpO2 97%   BMI 22.35 kg/m   Physical Exam  Constitutional: She appears well-developed and well-nourished.  HENT:  Head: Normocephalic and atraumatic.  Eyes: Pupils are equal, round, and reactive to light.  Neck: Normal range of motion. Neck supple.  Cardiovascular: Normal pulses. Exam reveals no decreased pulses.  Musculoskeletal: She exhibits tenderness. She exhibits no edema.       Right shoulder: She exhibits decreased range of motion and tenderness. She exhibits no bony tenderness.       Left shoulder: Normal.       Right elbow: Normal.She exhibits normal range of motion, no swelling and no effusion.       Right wrist: She exhibits tenderness. She exhibits normal range of motion and no bony tenderness.       Cervical back: She exhibits normal range of motion, no tenderness and no bony tenderness.       Right upper arm: She exhibits tenderness. She exhibits no bony tenderness and no swelling.       Right forearm: Normal. She exhibits no tenderness, no bony tenderness and no swelling.       Right hand: Normal. Normal sensation noted.  Neurological: She is alert. No sensory deficit.  Motor,  sensation, and vascular distal to the injury is fully intact.   Skin: Skin is warm and dry.  Psychiatric: She has a normal mood and affect.  Nursing note and vitals reviewed.    ED Treatments / Results   Radiology Dg Shoulder Right  Result Date: 07/26/2017 CLINICAL DATA:  Fall with injury to the right shoulder EXAM: RIGHT SHOULDER - 2+ VIEW COMPARISON:  Report 11/28/2004 FINDINGS: There is no evidence of fracture or dislocation. There is no evidence of arthropathy or other focal bone abnormality. Soft tissues are unremarkable. IMPRESSION: Negative. Electronically Signed   By: Donavan Foil M.D.   On: 07/26/2017 16:22    Procedures Procedures (including critical care time)  Medications Ordered in ED Medications - No data to display   Initial Impression / Assessment and Plan / ED Course  I have reviewed the triage vital signs and the nursing notes.  Pertinent labs & imaging results that were available during my care of the patient were reviewed by me and considered in my medical decision making (see chart for details).     Patient seen and examined.    Vital signs reviewed and are as follows: BP (!) 149/91 (BP Location: Right Arm)   Pulse 88   Temp 98.8 F (37.1 C) (Oral)   Resp 16   Ht 5' 8"  (1.727 m)   Wt 66.7 kg (147 lb)   SpO2 97%   BMI 22.35 kg/m   4:40 PM discussed results of x-rays with patient.  Will provide with splint.  Patient can continue to use her home pain medication.  Patient cannot take ibuprofen.  Sports medicine referral given.  Final Clinical Impressions(s) / ED Diagnoses   Final diagnoses:  Injury of right shoulder, initial encounter  Right wrist pain   Patient with right upper  extremity pain after fall several days ago.  Fall sounds like it was mechanical.  Patient is wearing a splint on her right wrist, but has good range of motion I do not suspect fracture.  Pain is worse in her shoulder.  X-rays are negative.  Upper extremity is  neurovascularly intact.  Sports medicine follow-up, rice protocol indicated.  ED Discharge Orders    None       Carlisle Cater, PA-C 07/26/17 1641    Little, Wenda Overland, MD 07/26/17 [phone]

## 2017-08-21 ENCOUNTER — Ambulatory Visit (INDEPENDENT_AMBULATORY_CARE_PROVIDER_SITE_OTHER): Payer: Medicare HMO | Admitting: Family Medicine

## 2017-08-21 ENCOUNTER — Encounter: Payer: Self-pay | Admitting: Family Medicine

## 2017-08-21 DIAGNOSIS — S4991XA Unspecified injury of right shoulder and upper arm, initial encounter: Secondary | ICD-10-CM

## 2017-08-21 MED ORDER — DICLOFENAC SODIUM 1 % TD GEL: 2.0000 g | Freq: Four times a day (QID) | TRANSDERMAL | 1 refills | Status: DC | PRN

## 2017-08-21 MED ORDER — NITROGLYCERIN 0.2 MG/HR TD PT24: MEDICATED_PATCH | TRANSDERMAL | 1 refills | Status: DC

## 2017-08-21 NOTE — Patient Instructions (Signed)
You strained your rotator cuff. Try to avoid painful activities (overhead activities, lifting with extended arm) as much as possible. Voltaren gel up to 4 times a day for pain and inflammation. Continue your gabapentin, lidoderm patches, oxycodone. Nitro patches 1/4th patch to affected shoulder, change daily. Consider physical therapy with transition to home exercise program. Do home exercise program with theraband and scapular stabilization exercises daily 3 sets of 10 once a day. If not improving at follow-up we will consider further imaging (MRI), physical therapy. Follow up with me in 1 month.

## 2017-08-23 ENCOUNTER — Encounter: Payer: Self-pay | Admitting: Family Medicine

## 2017-08-23 DIAGNOSIS — J449 Chronic obstructive pulmonary disease, unspecified: Secondary | ICD-10-CM | POA: Insufficient documentation

## 2017-08-23 DIAGNOSIS — S4991XD Unspecified injury of right shoulder and upper arm, subsequent encounter: Secondary | ICD-10-CM | POA: Insufficient documentation

## 2017-08-23 DIAGNOSIS — G8929 Other chronic pain: Secondary | ICD-10-CM | POA: Insufficient documentation

## 2017-08-23 DIAGNOSIS — Z8719 Personal history of other diseases of the digestive system: Secondary | ICD-10-CM | POA: Insufficient documentation

## 2017-08-23 DIAGNOSIS — G729 Myopathy, unspecified: Secondary | ICD-10-CM | POA: Insufficient documentation

## 2017-08-23 NOTE — Assessment & Plan Note (Signed)
consistent with strain of rotator cuff.  Continue gabapentin, oxycodone, lidoderm patches.  Add voltaren gel and nitro patches.  Shown home exercises to do daily.  Declined PT for now.  F/u in 1 month.

## 2017-08-23 NOTE — Progress Notes (Signed)
PCP: System, Pcp Not In  Subjective:   HPI: Patient is a 62 y.o. female here for right shoulder injury.  Patient reports she's had two falls in the past month - on 12/19 and on 1/8. First fall she twisted after slipping and fell onto right side. Second fall she fell forward and hit concrete with hands, face, knees. Pain in right shoulder is anterior to posterior and down her upper arm. Takes oxycodone, gabapentin, and uses lidoderm patches. No prior injuries to this shoulder. Has history of polymyositis and muscular dystrophy. Pain level is 6/10 and sharp. No skin changes, numbness.  Past Medical History:  Diagnosis Date  . Arthritis   . Chest pain   . Chronic bronchitis   . COPD (chronic obstructive pulmonary disease) ([place])   . Crohn disease ([place])   . Emphysema   . Fibromyalgia   . Herpes   . Hyperlipidemia   . IBS (irritable bowel syndrome)   . Migraines   . Muscular dystrophy   . Neck pain   . Plantar fasciitis   . Scoliosis     Current Outpatient Medications on File Prior to Visit  Medication Sig Dispense Refill  . AMITIZA 24 MCG capsule Take 24 mcg by mouth 2 (two) times daily.    Marland Kitchen aspirin 81 MG tablet Take 81 mg by mouth daily.    Marland Kitchen co-enzyme Q-10 30 MG capsule Take 30 mg by mouth daily.      Marland Kitchen doxepin (SINEQUAN) 10 MG capsule Take 10 mg by mouth at bedtime.    . DULoxetine (CYMBALTA) 60 MG capsule Take 60 mg by mouth 2 (two) times daily.    Marland Kitchen estradiol (ESTRACE) 1 MG tablet Take 1 mg by mouth daily.    . fentaNYL (DURAGESIC - DOSED MCG/HR) 50 MCG/HR Place 1 patch onto the skin every other day.     . fluticasone (FLONASE) 50 MCG/ACT nasal spray Place 1 spray into both nostrils daily as needed for allergies.     . Fluticasone Furoate-Vilanterol (BREO ELLIPTA) 100-25 MCG/INH AEPB Inhale 1 puff into the lungs daily.    Marland Kitchen gabapentin (NEURONTIN) 300 MG capsule Take 600 mg by mouth 3 (three) times daily.     Marland Kitchen gemfibrozil (LOPID) 600 MG tablet Take 600 mg by mouth 2  (two) times daily before a meal.      . isosorbide mononitrate (IMDUR) 30 MG 24 hr tablet Take 30 mg by mouth daily.    Marland Kitchen l-methylfolate-B6-B12 (METANX) 3-35-2 MG TABS Take 1 tablet by mouth daily.    Marland Kitchen lidocaine (LIDODERM) 5 % Place 1-2 patches onto the skin daily. Remove & Discard patch within 12 hours or as directed by MD    . loratadine (CLARITIN) 10 MG tablet Take 10 mg by mouth 2 (two) times daily.      Marland Kitchen LORazepam (ATIVAN) 0.5 MG tablet Take 0.5 mg by mouth 3 (three) times daily.     . magnesium hydroxide (MILK OF MAGNESIA) 400 MG/5ML suspension Take 15 mLs by mouth at bedtime.    . Melatonin (MELATONIN TR) 10 MG TBCR Take 10 mg by mouth at bedtime.    . Melatonin 5 MG TABS Take 5 mg by mouth at bedtime.    Marland Kitchen MELATONIN ER PO Take by mouth.    . methocarbamol (ROBAXIN) 500 MG tablet Take 500 mg by mouth 3 (three) times daily.    . montelukast (SINGULAIR) 10 MG tablet Take 10 mg by mouth daily.    . Multiple Vitamin (  MULTIVITAMIN) capsule Take 1 capsule by mouth daily.      Marland Kitchen NITROSTAT 0.4 MG SL tablet Place 0.4 mg under the tongue every 15 (fifteen) minutes as needed for chest pain.     Marland Kitchen omeprazole (PRILOSEC) 40 MG capsule Take 40 mg by mouth 2 (two) times daily.      Marland Kitchen oxyCODONE-acetaminophen (PERCOCET) 10-325 MG per tablet Take 1 tablet by mouth every 8 (eight) hours as needed for pain.    . polyethylene glycol powder (GLYCOLAX/MIRALAX) powder Take 4 Containers by mouth daily.    Marland Kitchen PROAIR HFA 108 (90 BASE) MCG/ACT inhaler Inhale 2 puffs into the lungs every 4 (four) hours as needed for wheezing.     . topiramate (TOPAMAX) 100 MG tablet Take 100 mg by mouth daily.      Marland Kitchen triamterene-hydrochlorothiazide (DYAZIDE) 37.5-25 MG per capsule Take 1 capsule by mouth every morning.      . valACYclovir (VALTREX) 500 MG tablet Take 500 mg by mouth 2 (two) times daily.      . Vitamin D, Ergocalciferol, (DRISDOL) 50000 UNITS CAPS capsule Take 1 Units by mouth once a week.     No current  facility-administered medications on file prior to visit.     Past Surgical History:  Procedure Laterality Date  . ABDOMINAL HYSTERECTOMY    . bone spur    . CHOLECYSTECTOMY    . HERNIA REPAIR    . LEFT HEART CATHETERIZATION WITH CORONARY ANGIOGRAM N/A 11/07/2013   Procedure: LEFT HEART CATHETERIZATION WITH CORONARY ANGIOGRAM;  Surgeon: Lorretta Harp, MD;  Location: [facility] CATH LAB;  Service: Cardiovascular;  Laterality: N/A;  . MASTECTOMY PARTIAL / LUMPECTOMY    . OOPHORECTOMY    . ROTATOR CUFF REPAIR      Allergies  Allergen Reactions  . Sulfa Antibiotics Shortness Of Breath, Swelling and Rash  . Dilaudid [Hydromorphone Hcl]     Makes me crazy  . Metronidazole Diarrhea and Nausea And Vomiting  . Prednisone Other (See Comments)    angry angry   . Cephalexin Diarrhea and Nausea And Vomiting  . Methotrexate Derivatives Swelling and Rash  . Morphine And Related Anxiety    "exreme mood swings"    Social History   Socioeconomic History  . Marital status: Married    Spouse name: Not on file  . Number of children: Not on file  . Years of education: Not on file  . Highest education level: Not on file  Social Needs  . Financial resource strain: Not on file  . Food insecurity - worry: Not on file  . Food insecurity - inability: Not on file  . Transportation needs - medical: Not on file  . Transportation needs - non-medical: Not on file  Occupational History  . Not on file  Tobacco Use  . Smoking status: Former Smoker    Types: Cigarettes  . Smokeless tobacco: Never Used  Substance and Sexual Activity  . Alcohol use: No  . Drug use: No  . Sexual activity: Yes    Birth control/protection: Surgical  Other Topics Concern  . Not on file  Social History Narrative  . Not on file    History reviewed. No pertinent family history.  BP 124/85   Pulse 90   Ht 5' 8"  (1.727 m)   Wt 140 lb (63.5 kg)   BMI 21.29 kg/m   Review of Systems: See HPI above.     Objective:   Physical Exam:  Gen: NAD, comfortable in exam room  Right shoulder: No swelling, ecchymoses.  No gross deformity. No TTP. FROM with painful arc. Positive Hawkins, Neers. Negative Yergasons. Strength 5/5 with empty can and resisted internal/external rotation.  Pain empty can > ER. Negative apprehension. NV intact distally.  Left shoulder: No swelling, ecchymoses.  No gross deformity. No TTP. FROM. Strength 5/5 with empty can and resisted internal/external rotation. NV intact distally.   Assessment & Plan:  1. Right shoulder injury - consistent with strain of rotator cuff.  Continue gabapentin, oxycodone, lidoderm patches.  Add voltaren gel and nitro patches.  Shown home exercises to do daily.  Declined PT for now.  F/u in 1 month.

## 2017-09-21 ENCOUNTER — Ambulatory Visit: Payer: Medicare HMO | Admitting: Family Medicine

## 2017-10-12 ENCOUNTER — Ambulatory Visit: Payer: Medicare HMO | Admitting: Family Medicine

## 2017-10-16 ENCOUNTER — Ambulatory Visit: Payer: Medicare HMO | Admitting: Family Medicine

## 2017-11-02 ENCOUNTER — Encounter: Payer: Self-pay | Admitting: Family Medicine

## 2017-11-02 ENCOUNTER — Ambulatory Visit: Payer: Self-pay

## 2017-11-02 ENCOUNTER — Ambulatory Visit (INDEPENDENT_AMBULATORY_CARE_PROVIDER_SITE_OTHER): Payer: Medicare HMO | Admitting: Family Medicine

## 2017-11-02 VITALS — BP 122/70 | HR 79 | Ht 68.0 in | Wt 145.0 lb

## 2017-11-02 DIAGNOSIS — S4991XA Unspecified injury of right shoulder and upper arm, initial encounter: Secondary | ICD-10-CM

## 2017-11-02 DIAGNOSIS — M6530 Trigger finger, unspecified finger: Secondary | ICD-10-CM | POA: Diagnosis not present

## 2017-11-02 DIAGNOSIS — S4991XD Unspecified injury of right shoulder and upper arm, subsequent encounter: Secondary | ICD-10-CM | POA: Diagnosis not present

## 2017-11-02 NOTE — Patient Instructions (Addendum)
You have a trigger finger of your right thumb. Splint this in extension typically for 6-10 weeks. Heat can help with this also. Consider cortisone injection if not improving.  You have rotator cuff impingement and biceps tenosynovitis. Try to avoid painful activities (overhead activities, lifting with extended arm) as much as possible. Tylenol, topical voltaren gel. Injection may be beneficial to help with pain and to decrease inflammation - set up and appointment for this and your finger injection if you want. Consider physical therapy with transition to home exercise program. Do home exercise program with theraband and scapular stabilization exercises daily 3 sets of 10 once a day. Follow up with me in 6 weeks otherwise.

## 2017-11-03 ENCOUNTER — Encounter: Payer: Self-pay | Admitting: Family Medicine

## 2017-11-03 DIAGNOSIS — M6530 Trigger finger, unspecified finger: Secondary | ICD-10-CM | POA: Insufficient documentation

## 2017-11-03 NOTE — Assessment & Plan Note (Signed)
Right thumb trigger finger - reviewed splinting, heat.  She is also considering injection for this.

## 2017-11-03 NOTE — Assessment & Plan Note (Signed)
Ultrasound reassuring, consistent with impingement and biceps tenosynovitis.  Reassured patient.  Continue nitro patches, voltaren gel, home exercises.  She will consider physical therapy - reported she will likely return for biceps tendon sheath injection.  F/u in 6 weeks.

## 2017-11-03 NOTE — Progress Notes (Signed)
PCP: [name], PA-C  Subjective:   HPI: Patient is a 62 y.o. female here for right shoulder injury.  1/14: Patient reports she's had two falls in the past month - on 12/19 and on 1/8. First fall she twisted after slipping and fell onto right side. Second fall she fell forward and hit concrete with hands, face, knees. Pain in right shoulder is anterior to posterior and down her upper arm. Takes oxycodone, gabapentin, and uses lidoderm patches. No prior injuries to this shoulder. Has history of polymyositis and muscular dystrophy. Pain level is 6/10 and sharp. No skin changes, numbness.  3/29: Patient reports she feels maybe a little better. Pain level 6/10 and sharp lateral right shoulder. Has been using voltaren gel, doing home exercises, nitro patches 1/2 patch with mild benefit. Cannot lie on this. Pain goes into her right bicep. Also reports getting catching of her right thumb with flexion, pain in palm at base of right thumb. No skin changes, numbness.  Past Medical History:  Diagnosis Date  . Arthritis   . Chest pain   . Chronic bronchitis   . COPD (chronic obstructive pulmonary disease) ([place])   . Crohn disease ([place])   . Emphysema   . Fibromyalgia   . Herpes   . Hyperlipidemia   . IBS (irritable bowel syndrome)   . Migraines   . Muscular dystrophy   . Neck pain   . Plantar fasciitis   . Scoliosis     Current Outpatient Medications on File Prior to Visit  Medication Sig Dispense Refill  . AMITIZA 24 MCG capsule Take 24 mcg by mouth 2 (two) times daily.    Marland Kitchen aspirin 81 MG tablet Take 81 mg by mouth daily.    Marland Kitchen co-enzyme Q-10 30 MG capsule Take 30 mg by mouth daily.      . diclofenac sodium (VOLTAREN) 1 % GEL Apply 2 g topically 4 (four) times daily as needed (for pain). 5 Tube 1  . doxepin (SINEQUAN) 10 MG capsule Take 10 mg by mouth at bedtime.    . DULoxetine (CYMBALTA) 60 MG capsule Take 60 mg by mouth 2 (two) times daily.    Marland Kitchen estradiol (ESTRACE) 1 MG  tablet Take 1 mg by mouth daily.    . fentaNYL (DURAGESIC - DOSED MCG/HR) 50 MCG/HR Place 1 patch onto the skin every other day.     . fluticasone (FLONASE) 50 MCG/ACT nasal spray Place 1 spray into both nostrils daily as needed for allergies.     . Fluticasone Furoate-Vilanterol (BREO ELLIPTA) 100-25 MCG/INH AEPB Inhale 1 puff into the lungs daily.    Marland Kitchen gabapentin (NEURONTIN) 300 MG capsule Take 600 mg by mouth 3 (three) times daily.     Marland Kitchen gemfibrozil (LOPID) 600 MG tablet Take 600 mg by mouth 2 (two) times daily before a meal.      . isosorbide mononitrate (IMDUR) 30 MG 24 hr tablet Take 30 mg by mouth daily.    Marland Kitchen l-methylfolate-B6-B12 (METANX) 3-35-2 MG TABS Take 1 tablet by mouth daily.    Marland Kitchen lidocaine (LIDODERM) 5 % Place 1-2 patches onto the skin daily. Remove & Discard patch within 12 hours or as directed by MD    . loratadine (CLARITIN) 10 MG tablet Take 10 mg by mouth 2 (two) times daily.      Marland Kitchen LORazepam (ATIVAN) 0.5 MG tablet Take 0.5 mg by mouth 3 (three) times daily.     . magnesium hydroxide (MILK OF MAGNESIA) 400 MG/5ML suspension  Take 15 mLs by mouth at bedtime.    . Melatonin (MELATONIN TR) 10 MG TBCR Take 10 mg by mouth at bedtime.    . Melatonin 5 MG TABS Take 5 mg by mouth at bedtime.    Marland Kitchen MELATONIN ER PO Take by mouth.    . methocarbamol (ROBAXIN) 500 MG tablet Take 500 mg by mouth 3 (three) times daily.    . montelukast (SINGULAIR) 10 MG tablet Take 10 mg by mouth daily.    . Multiple Vitamin (MULTIVITAMIN) capsule Take 1 capsule by mouth daily.      . nitroGLYCERIN (NITRODUR - DOSED IN MG/24 HR) 0.2 mg/hr patch Apply 1/4th patch to affected shoulder, change daily 30 patch 1  . NITROSTAT 0.4 MG SL tablet Place 0.4 mg under the tongue every 15 (fifteen) minutes as needed for chest pain.     Marland Kitchen omeprazole (PRILOSEC) 40 MG capsule Take 40 mg by mouth 2 (two) times daily.      Marland Kitchen oxyCODONE-acetaminophen (PERCOCET) 10-325 MG per tablet Take 1 tablet by mouth every 8 (eight) hours  as needed for pain.    . polyethylene glycol powder (GLYCOLAX/MIRALAX) powder Take 4 Containers by mouth daily.    Marland Kitchen PROAIR HFA 108 (90 BASE) MCG/ACT inhaler Inhale 2 puffs into the lungs every 4 (four) hours as needed for wheezing.     . topiramate (TOPAMAX) 100 MG tablet Take 100 mg by mouth daily.      Marland Kitchen triamterene-hydrochlorothiazide (DYAZIDE) 37.5-25 MG per capsule Take 1 capsule by mouth every morning.      . valACYclovir (VALTREX) 500 MG tablet Take 500 mg by mouth 2 (two) times daily.      . Vitamin D, Ergocalciferol, (DRISDOL) 50000 UNITS CAPS capsule Take 1 Units by mouth once a week.     No current facility-administered medications on file prior to visit.     Past Surgical History:  Procedure Laterality Date  . ABDOMINAL HYSTERECTOMY    . bone spur    . CHOLECYSTECTOMY    . HERNIA REPAIR    . LEFT HEART CATHETERIZATION WITH CORONARY ANGIOGRAM N/A 11/07/2013   Procedure: LEFT HEART CATHETERIZATION WITH CORONARY ANGIOGRAM;  Surgeon: Lorretta Harp, MD;  Location: [facility] CATH LAB;  Service: Cardiovascular;  Laterality: N/A;  . MASTECTOMY PARTIAL / LUMPECTOMY    . OOPHORECTOMY    . ROTATOR CUFF REPAIR      Allergies  Allergen Reactions  . Sulfa Antibiotics Shortness Of Breath, Swelling and Rash  . Dilaudid [Hydromorphone Hcl]     Makes me crazy  . Metronidazole Diarrhea and Nausea And Vomiting  . Prednisone Other (See Comments)    angry angry   . Cephalexin Diarrhea and Nausea And Vomiting  . Methotrexate Derivatives Swelling and Rash  . Morphine And Related Anxiety    "exreme mood swings"    Social History   Socioeconomic History  . Marital status: Married    Spouse name: Not on file  . Number of children: Not on file  . Years of education: Not on file  . Highest education level: Not on file  Occupational History  . Not on file  Social Needs  . Financial resource strain: Not on file  . Food insecurity:    Worry: Not on file    Inability: Not on file  .  Transportation needs:    Medical: Not on file    Non-medical: Not on file  Tobacco Use  . Smoking status: Former Smoker  Types: Cigarettes  . Smokeless tobacco: Never Used  Substance and Sexual Activity  . Alcohol use: No  . Drug use: No  . Sexual activity: Yes    Birth control/protection: Surgical  Lifestyle  . Physical activity:    Days per week: Not on file    Minutes per session: Not on file  . Stress: Not on file  Relationships  . Social connections:    Talks on phone: Not on file    Gets together: Not on file    Attends religious service: Not on file    Active member of club or organization: Not on file    Attends meetings of clubs or organizations: Not on file    Relationship status: Not on file  . Intimate partner violence:    Fear of current or ex partner: Not on file    Emotionally abused: Not on file    Physically abused: Not on file    Forced sexual activity: Not on file  Other Topics Concern  . Not on file  Social History Narrative  . Not on file    History reviewed. No pertinent family history.  BP 122/70   Pulse 79   Ht 5' 8"  (1.727 m)   Wt 145 lb (65.8 kg)   BMI 22.05 kg/m   Review of Systems: See HPI above.     Objective:  Physical Exam:  Gen: NAD, comfortable in exam room.  Right shoulder: No swelling, ecchymoses.  No gross deformity. No TTP. FROM with painful arc. Positive Hawkins, Neers. Negative Yergasons. Strength 5/5 with empty can and resisted internal/external rotation.  Pain empty can and ER Negative apprehension. NV intact distally.  Left shoulder: No swelling, ecchymoses.  No gross deformity. No TTP. FROM. Strength 5/5 with empty can and resisted internal/external rotation. NV intact distally.  Right hand: Small nodule, painful, palpated at A1 pulley of 1st digit.  No other deformity. Clicking/catching with flexion of 1st IP joint.  MSK u/s:  Biceps tendon intact on long and trans views with mod tenosynovitis.   Subscapularis, infraspinatus normal without evidence tear.  AC joint with mild arthropathy.  Supraspinatus without evidence tear.  No subacromial bursitis.  Assessment & Plan:  1. Right shoulder injury - Ultrasound reassuring, consistent with impingement and biceps tenosynovitis.  Reassured patient.  Continue nitro patches, voltaren gel, home exercises.  She will consider physical therapy - reported she will likely return for biceps tendon sheath injection.  F/u in 6 weeks.  2. Right thumb trigger finger - reviewed splinting, heat.  She is also considering injection for this.

## 2017-11-10 ENCOUNTER — Ambulatory Visit: Payer: Medicare HMO | Admitting: Family Medicine

## 2017-12-14 ENCOUNTER — Ambulatory Visit: Payer: Medicare HMO | Admitting: Family Medicine

## 2017-12-21 ENCOUNTER — Ambulatory Visit: Payer: Medicare HMO | Admitting: Family Medicine

## 2017-12-21 ENCOUNTER — Encounter (INDEPENDENT_AMBULATORY_CARE_PROVIDER_SITE_OTHER): Payer: Self-pay

## 2017-12-21 ENCOUNTER — Encounter: Payer: Self-pay | Admitting: Family Medicine

## 2017-12-21 DIAGNOSIS — M6530 Trigger finger, unspecified finger: Secondary | ICD-10-CM | POA: Diagnosis not present

## 2017-12-21 MED ORDER — METHYLPREDNISOLONE ACETATE 40 MG/ML IJ SUSP
20.0000 mg | Freq: Once | INTRAMUSCULAR | Status: AC
  Administered 2017-12-21: 20 mg via INTRA_ARTICULAR

## 2017-12-21 NOTE — Patient Instructions (Signed)
You have a trigger finger of your right thumb. Continue with the splinting. You were given an injection for this today. Heat can help with this also.  You have rotator cuff impingement and biceps tenosynovitis. Try to avoid painful activities (overhead activities, lifting with extended arm) as much as possible. Tylenol, nitro patches as you have been. Consider injection for this if it's bothering you a lot. Consider physical therapy with transition to home exercise program. Do home exercise program with theraband and scapular stabilization exercises daily 3 sets of 10 once a day. Follow up with me in 6 weeks otherwise. If you can find the article about the diclofenac gel please send it to me through mychart.

## 2017-12-24 ENCOUNTER — Encounter: Payer: Self-pay | Admitting: Family Medicine

## 2017-12-24 NOTE — Assessment & Plan Note (Signed)
Right thumb trigger finger - still with persistent pain, difficulty flexing at IP joint with nodule and pain at A1 pulley.  Not improving with splinting, rest.  Injection given today.  F/u in 6 weeks.  After informed written consent timeout was performed.  Patient was seated in chair in exam room.  Area overlying right 1st digit A1 pulley prepped with alcohol swab then injected with 0.5:0.5 bupivicaine:depomedrol.  Patient tolerated procedure well without immediate complications.

## 2017-12-24 NOTE — Progress Notes (Signed)
PCP: [name], PA-C  Subjective:   HPI: Patient is a 62 y.o. female here for right shoulder injury.  1/14: Patient reports she's had two falls in the past month - on 12/19 and on 1/8. First fall she twisted after slipping and fell onto right side. Second fall she fell forward and hit concrete with hands, face, knees. Pain in right shoulder is anterior to posterior and down her upper arm. Takes oxycodone, gabapentin, and uses lidoderm patches. No prior injuries to this shoulder. Has history of polymyositis and muscular dystrophy. Pain level is 6/10 and sharp. No skin changes, numbness.  3/29: Patient reports she feels maybe a little better. Pain level 6/10 and sharp lateral right shoulder. Has been using voltaren gel, doing home exercises, nitro patches 1/2 patch with mild benefit. Cannot lie on this. Pain goes into her right bicep. Also reports getting catching of her right thumb with flexion, pain in palm at base of right thumb. No skin changes, numbness.  5/16: Patient reports her shoulder has improved compared to last visit. Her right thumb pain continues at 3/10 level and difficulty bending at IP joint. Pain at base of the thumb. No skin changes, numbness.  Past Medical History:  Diagnosis Date  . Arthritis   . Chest pain   . Chronic bronchitis   . COPD (chronic obstructive pulmonary disease) ([place])   . Crohn disease ([place])   . Emphysema   . Fibromyalgia   . Herpes   . Hyperlipidemia   . IBS (irritable bowel syndrome)   . Migraines   . Muscular dystrophy ([place])   . Neck pain   . Plantar fasciitis   . Scoliosis     Current Outpatient Medications on File Prior to Visit  Medication Sig Dispense Refill  . AMITIZA 24 MCG capsule Take 24 mcg by mouth 2 (two) times daily.    Marland Kitchen aspirin 81 MG tablet Take 81 mg by mouth daily.    Marland Kitchen co-enzyme Q-10 30 MG capsule Take 30 mg by mouth daily.      . diclofenac sodium (VOLTAREN) 1 % GEL Apply 2 g topically 4 (four) times  daily as needed (for pain). 5 Tube 1  . doxepin (SINEQUAN) 10 MG capsule Take 10 mg by mouth at bedtime.    . DULoxetine (CYMBALTA) 60 MG capsule Take 60 mg by mouth 2 (two) times daily.    Marland Kitchen estradiol (ESTRACE) 1 MG tablet Take 1 mg by mouth daily.    . fentaNYL (DURAGESIC - DOSED MCG/HR) 50 MCG/HR Place 1 patch onto the skin every other day.     . fluticasone (FLONASE) 50 MCG/ACT nasal spray Place 1 spray into both nostrils daily as needed for allergies.     . Fluticasone Furoate-Vilanterol (BREO ELLIPTA) 100-25 MCG/INH AEPB Inhale 1 puff into the lungs daily.    Marland Kitchen gabapentin (NEURONTIN) 300 MG capsule Take 600 mg by mouth 3 (three) times daily.     Marland Kitchen gemfibrozil (LOPID) 600 MG tablet Take 600 mg by mouth 2 (two) times daily before a meal.      . isosorbide mononitrate (IMDUR) 30 MG 24 hr tablet Take 30 mg by mouth daily.    Marland Kitchen l-methylfolate-B6-B12 (METANX) 3-35-2 MG TABS Take 1 tablet by mouth daily.    Marland Kitchen lidocaine (LIDODERM) 5 % Place 1-2 patches onto the skin daily. Remove & Discard patch within 12 hours or as directed by MD    . loratadine (CLARITIN) 10 MG tablet Take 10 mg by mouth  2 (two) times daily.      Marland Kitchen LORazepam (ATIVAN) 0.5 MG tablet Take 0.5 mg by mouth 3 (three) times daily.     . magnesium hydroxide (MILK OF MAGNESIA) 400 MG/5ML suspension Take 15 mLs by mouth at bedtime.    . Melatonin (MELATONIN TR) 10 MG TBCR Take 10 mg by mouth at bedtime.    . Melatonin 5 MG TABS Take 5 mg by mouth at bedtime.    Marland Kitchen MELATONIN ER PO Take by mouth.    . methocarbamol (ROBAXIN) 500 MG tablet Take 500 mg by mouth 3 (three) times daily.    . montelukast (SINGULAIR) 10 MG tablet Take 10 mg by mouth daily.    . Multiple Vitamin (MULTIVITAMIN) capsule Take 1 capsule by mouth daily.      . nitroGLYCERIN (NITRODUR - DOSED IN MG/24 HR) 0.2 mg/hr patch Apply 1/4th patch to affected shoulder, change daily 30 patch 1  . NITROSTAT 0.4 MG SL tablet Place 0.4 mg under the tongue every 15 (fifteen) minutes  as needed for chest pain.     Marland Kitchen omeprazole (PRILOSEC) 40 MG capsule Take 40 mg by mouth 2 (two) times daily.      Marland Kitchen oxyCODONE-acetaminophen (PERCOCET) 10-325 MG per tablet Take 1 tablet by mouth every 8 (eight) hours as needed for pain.    . polyethylene glycol powder (GLYCOLAX/MIRALAX) powder Take 4 Containers by mouth daily.    Marland Kitchen PROAIR HFA 108 (90 BASE) MCG/ACT inhaler Inhale 2 puffs into the lungs every 4 (four) hours as needed for wheezing.     . topiramate (TOPAMAX) 100 MG tablet Take 100 mg by mouth daily.      Marland Kitchen triamterene-hydrochlorothiazide (DYAZIDE) 37.5-25 MG per capsule Take 1 capsule by mouth every morning.      . valACYclovir (VALTREX) 500 MG tablet Take 500 mg by mouth 2 (two) times daily.      . Vitamin D, Ergocalciferol, (DRISDOL) 50000 UNITS CAPS capsule Take 1 Units by mouth once a week.     No current facility-administered medications on file prior to visit.     Past Surgical History:  Procedure Laterality Date  . ABDOMINAL HYSTERECTOMY    . bone spur    . CHOLECYSTECTOMY    . HERNIA REPAIR    . LEFT HEART CATHETERIZATION WITH CORONARY ANGIOGRAM N/A 11/07/2013   Procedure: LEFT HEART CATHETERIZATION WITH CORONARY ANGIOGRAM;  Surgeon: Lorretta Harp, MD;  Location: [facility] CATH LAB;  Service: Cardiovascular;  Laterality: N/A;  . MASTECTOMY PARTIAL / LUMPECTOMY    . OOPHORECTOMY    . ROTATOR CUFF REPAIR      Allergies  Allergen Reactions  . Sulfa Antibiotics Shortness Of Breath, Swelling and Rash  . Dilaudid [Hydromorphone Hcl]     Makes me crazy  . Metronidazole Diarrhea and Nausea And Vomiting  . Prednisone Other (See Comments)    angry angry   . Cephalexin Diarrhea and Nausea And Vomiting  . Methotrexate Derivatives Swelling and Rash  . Morphine And Related Anxiety    "exreme mood swings"    Social History   Socioeconomic History  . Marital status: Married    Spouse name: Not on file  . Number of children: Not on file  . Years of education: Not on  file  . Highest education level: Not on file  Occupational History  . Not on file  Social Needs  . Financial resource strain: Not on file  . Food insecurity:    Worry: Not on file  Inability: Not on file  . Transportation needs:    Medical: Not on file    Non-medical: Not on file  Tobacco Use  . Smoking status: Former Smoker    Types: Cigarettes  . Smokeless tobacco: Never Used  Substance and Sexual Activity  . Alcohol use: No  . Drug use: No  . Sexual activity: Yes    Birth control/protection: Surgical  Lifestyle  . Physical activity:    Days per week: Not on file    Minutes per session: Not on file  . Stress: Not on file  Relationships  . Social connections:    Talks on phone: Not on file    Gets together: Not on file    Attends religious service: Not on file    Active member of club or organization: Not on file    Attends meetings of clubs or organizations: Not on file    Relationship status: Not on file  . Intimate partner violence:    Fear of current or ex partner: Not on file    Emotionally abused: Not on file    Physically abused: Not on file    Forced sexual activity: Not on file  Other Topics Concern  . Not on file  Social History Narrative  . Not on file    History reviewed. No pertinent family history.  BP 114/70   Pulse 88   Ht 5' 8"  (1.727 m)   Wt 147 lb (66.7 kg)   BMI 22.35 kg/m   Review of Systems: See HPI above.     Objective:  Physical Exam:  Gen: NAD, comfortable in exam room  Right thumb: IP joint in extension, band aids splinting this currently.  No malrotation or angulation. Full extension at IP, MCP joints.  Difficulty flexing at IP joint with pain. Strength 5/5 with flexion, extension. TTP at A1 pulley with nodule palpated here. Collateral ligaments intact. NVI distally.  Assessment & Plan:  1. Right thumb trigger finger - still with persistent pain, difficulty flexing at IP joint with nodule and pain at A1 pulley.  Not  improving with splinting, rest.  Injection given today.  F/u in 6 weeks.  After informed written consent timeout was performed.  Patient was seated in chair in exam room.  Area overlying right 1st digit A1 pulley prepped with alcohol swab then injected with 0.5:0.5 bupivicaine:depomedrol.  Patient tolerated procedure well without immediate complications.

## 2018-02-23 DIAGNOSIS — T7840XA Allergy, unspecified, initial encounter: Secondary | ICD-10-CM | POA: Insufficient documentation

## 2018-03-06 ENCOUNTER — Other Ambulatory Visit: Payer: Self-pay | Admitting: Family Medicine

## 2018-03-19 ENCOUNTER — Ambulatory Visit: Payer: Medicare HMO | Admitting: Family Medicine

## 2018-03-27 ENCOUNTER — Ambulatory Visit: Payer: Medicare HMO | Admitting: Family Medicine

## 2018-04-10 ENCOUNTER — Ambulatory Visit: Payer: Medicare HMO | Admitting: Family Medicine

## 2018-04-12 ENCOUNTER — Ambulatory Visit: Payer: Medicare HMO | Admitting: Family Medicine

## 2018-04-25 ENCOUNTER — Ambulatory Visit: Payer: Medicare HMO | Admitting: Family Medicine

## 2018-04-25 ENCOUNTER — Ambulatory Visit (HOSPITAL_BASED_OUTPATIENT_CLINIC_OR_DEPARTMENT_OTHER)
Admission: RE | Admit: 2018-04-25 | Discharge: 2018-04-25 | Disposition: A | Discharge status: Home / Self Care | Payer: Medicare HMO | Source: Ambulatory Visit | Attending: Family Medicine | Admitting: Family Medicine

## 2018-04-25 ENCOUNTER — Encounter: Payer: Self-pay | Admitting: Family Medicine

## 2018-04-25 VITALS — BP 124/79 | HR 77 | Ht 68.0 in | Wt 156.0 lb

## 2018-04-25 DIAGNOSIS — M25521 Pain in right elbow: Secondary | ICD-10-CM

## 2018-04-25 DIAGNOSIS — M6530 Trigger finger, unspecified finger: Secondary | ICD-10-CM

## 2018-04-25 MED ORDER — METHYLPREDNISOLONE ACETATE 40 MG/ML IJ SUSP
20.0000 mg | Freq: Once | INTRAMUSCULAR | Status: AC
  Administered 2018-04-25: 20 mg via INTRA_ARTICULAR

## 2018-04-25 MED ORDER — NITROGLYCERIN 0.2 MG/HR TD PT24: MEDICATED_PATCH | TRANSDERMAL | 1 refills | Status: DC

## 2018-04-25 NOTE — Patient Instructions (Signed)
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 Tylenol or aleve as needed for pain. Icing 3-4 times a day - careful around ulnar nerve on inside of elbow. Strengthening with 1 pound weight pronation/supination, wrist flexion, stretching exercises (hold stretches 20-30 seconds and repeat 3 times, exercises 3 sets of 10 of each). Sleeve or counterforce brace may be helpful to unload area of pain while it heals. Consider formal physical therapy, nitro patches, injection if not improving with home exercises. Follow up with me in 6 weeks.  You have a trigger finger of your right thumb. U shaped aluminum splint definitely for the next week then as needed if you're improving You were given an injection for this today. Heat can help with this also. If still not improving just call me and would refer you to hand surgery.

## 2018-04-29 ENCOUNTER — Encounter: Payer: Self-pay | Admitting: Family Medicine

## 2018-04-29 NOTE — Progress Notes (Signed)
PCP: [name], PA-C  Subjective:   HPI: Patient is a 62 y.o. female here for right elbow, right thumb pain.  1/14: Patient reports she's had two falls in the past month - on 12/19 and on 1/8. First fall she twisted after slipping and fell onto right side. Second fall she fell forward and hit concrete with hands, face, knees. Pain in right shoulder is anterior to posterior and down her upper arm. Takes oxycodone, gabapentin, and uses lidoderm patches. No prior injuries to this shoulder. Has history of polymyositis and muscular dystrophy. Pain level is 6/10 and sharp. No skin changes, numbness.  3/29: Patient reports she feels maybe a little better. Pain level 6/10 and sharp lateral right shoulder. Has been using voltaren gel, doing home exercises, nitro patches 1/2 patch with mild benefit. Cannot lie on this. Pain goes into her right bicep. Also reports getting catching of her right thumb with flexion, pain in palm at base of right thumb. No skin changes, numbness.  5/16: Patient reports her shoulder has improved compared to last visit. Her right thumb pain continues at 3/10 level and difficulty bending at IP joint. Pain at base of the thumb. No skin changes, numbness.  9/18: Patient returns reporting injection for her right thumb helped but past couple weeks pain, catching of IP joint of thumb has returned. Pain level 4/10 here. Also reporting medial right elbow pain. 4/10 but up to 10/10 and sharp if touched. She recalls striking this on something leading to the injury. This has improved for the most part but, again, worse with touching the area. No skin changes, numbness.  Past Medical History:  Diagnosis Date  . Arthritis   . Chest pain   . Chronic bronchitis   . COPD (chronic obstructive pulmonary disease) ([place])   . Crohn disease ([place])   . Emphysema   . Fibromyalgia   . Herpes   . Hyperlipidemia   . IBS (irritable bowel syndrome)   . Migraines   .  Muscular dystrophy ([place])   . Neck pain   . Plantar fasciitis   . Scoliosis     Current Outpatient Medications on File Prior to Visit  Medication Sig Dispense Refill  . AMITIZA 24 MCG capsule Take 24 mcg by mouth 2 (two) times daily.    Marland Kitchen aspirin 81 MG tablet Take 81 mg by mouth daily.    Marland Kitchen co-enzyme Q-10 30 MG capsule Take 30 mg by mouth daily.      . diclofenac sodium (VOLTAREN) 1 % GEL Apply 2 g topically 4 (four) times daily as needed (for pain). 5 Tube 1  . doxepin (SINEQUAN) 10 MG capsule Take 10 mg by mouth at bedtime.    . DULoxetine (CYMBALTA) 60 MG capsule Take 60 mg by mouth 2 (two) times daily.    Marland Kitchen estradiol (ESTRACE) 1 MG tablet Take 1 mg by mouth daily.    . fentaNYL (DURAGESIC - DOSED MCG/HR) 50 MCG/HR Place 1 patch onto the skin every other day.     . fluticasone (FLONASE) 50 MCG/ACT nasal spray Place 1 spray into both nostrils daily as needed for allergies.     . Fluticasone Furoate-Vilanterol (BREO ELLIPTA) 100-25 MCG/INH AEPB Inhale 1 puff into the lungs daily.    Marland Kitchen gabapentin (NEURONTIN) 300 MG capsule Take 600 mg by mouth 3 (three) times daily.     Marland Kitchen gemfibrozil (LOPID) 600 MG tablet Take 600 mg by mouth 2 (two) times daily before a meal.      .  isosorbide mononitrate (IMDUR) 30 MG 24 hr tablet Take 30 mg by mouth daily.    Marland Kitchen l-methylfolate-B6-B12 (METANX) 3-35-2 MG TABS Take 1 tablet by mouth daily.    Marland Kitchen lidocaine (LIDODERM) 5 % Place 1-2 patches onto the skin daily. Remove & Discard patch within 12 hours or as directed by MD    . loratadine (CLARITIN) 10 MG tablet Take 10 mg by mouth 2 (two) times daily.      Marland Kitchen LORazepam (ATIVAN) 0.5 MG tablet Take 0.5 mg by mouth 3 (three) times daily.     . magnesium hydroxide (MILK OF MAGNESIA) 400 MG/5ML suspension Take 15 mLs by mouth at bedtime.    . Melatonin (MELATONIN TR) 10 MG TBCR Take 10 mg by mouth at bedtime.    . Melatonin 5 MG TABS Take 5 mg by mouth at bedtime.    Marland Kitchen MELATONIN ER PO Take by mouth.    . methocarbamol  (ROBAXIN) 500 MG tablet Take 500 mg by mouth 3 (three) times daily.    . montelukast (SINGULAIR) 10 MG tablet Take 10 mg by mouth daily.    . Multiple Vitamin (MULTIVITAMIN) capsule Take 1 capsule by mouth daily.      Marland Kitchen NITROSTAT 0.4 MG SL tablet Place 0.4 mg under the tongue every 15 (fifteen) minutes as needed for chest pain.     Marland Kitchen omeprazole (PRILOSEC) 40 MG capsule Take 40 mg by mouth 2 (two) times daily.      Marland Kitchen oxyCODONE-acetaminophen (PERCOCET) 10-325 MG per tablet Take 1 tablet by mouth every 8 (eight) hours as needed for pain.    . polyethylene glycol powder (GLYCOLAX/MIRALAX) powder Take 4 Containers by mouth daily.    Marland Kitchen PROAIR HFA 108 (90 BASE) MCG/ACT inhaler Inhale 2 puffs into the lungs every 4 (four) hours as needed for wheezing.     . topiramate (TOPAMAX) 100 MG tablet Take 100 mg by mouth daily.      Marland Kitchen triamterene-hydrochlorothiazide (DYAZIDE) 37.5-25 MG per capsule Take 1 capsule by mouth every morning.      . valACYclovir (VALTREX) 500 MG tablet Take 500 mg by mouth 2 (two) times daily.      . Vitamin D, Ergocalciferol, (DRISDOL) 50000 UNITS CAPS capsule Take 1 Units by mouth once a week.     No current facility-administered medications on file prior to visit.     Past Surgical History:  Procedure Laterality Date  . ABDOMINAL HYSTERECTOMY    . bone spur    . CHOLECYSTECTOMY    . HERNIA REPAIR    . LEFT HEART CATHETERIZATION WITH CORONARY ANGIOGRAM N/A 11/07/2013   Procedure: LEFT HEART CATHETERIZATION WITH CORONARY ANGIOGRAM;  Surgeon: Lorretta Harp, MD;  Location: [facility] CATH LAB;  Service: Cardiovascular;  Laterality: N/A;  . MASTECTOMY PARTIAL / LUMPECTOMY    . OOPHORECTOMY    . ROTATOR CUFF REPAIR      Allergies  Allergen Reactions  . Sulfa Antibiotics Shortness Of Breath, Swelling and Rash  . Dilaudid [Hydromorphone Hcl]     Makes me crazy  . Metronidazole Diarrhea and Nausea And Vomiting  . Prednisone Other (See Comments)    angry angry   . Cephalexin  Diarrhea and Nausea And Vomiting  . Methotrexate Derivatives Swelling and Rash  . Morphine And Related Anxiety    "exreme mood swings"    Social History   Socioeconomic History  . Marital status: Married    Spouse name: Not on file  . Number of children: Not on  file  . Years of education: Not on file  . Highest education level: Not on file  Occupational History  . Not on file  Social Needs  . Financial resource strain: Not on file  . Food insecurity:    Worry: Not on file    Inability: Not on file  . Transportation needs:    Medical: Not on file    Non-medical: Not on file  Tobacco Use  . Smoking status: Former Smoker    Types: Cigarettes  . Smokeless tobacco: Never Used  Substance and Sexual Activity  . Alcohol use: No  . Drug use: No  . Sexual activity: Yes    Birth control/protection: Surgical  Lifestyle  . Physical activity:    Days per week: Not on file    Minutes per session: Not on file  . Stress: Not on file  Relationships  . Social connections:    Talks on phone: Not on file    Gets together: Not on file    Attends religious service: Not on file    Active member of club or organization: Not on file    Attends meetings of clubs or organizations: Not on file    Relationship status: Not on file  . Intimate partner violence:    Fear of current or ex partner: Not on file    Emotionally abused: Not on file    Physically abused: Not on file    Forced sexual activity: Not on file  Other Topics Concern  . Not on file  Social History Narrative  . Not on file    History reviewed. No pertinent family history.  BP 124/79   Pulse 77   Ht 5' 8"  (1.727 m)   Wt 156 lb (70.8 kg)   BMI 23.72 kg/m   Review of Systems: See HPI above.     Objective:  Physical Exam:  Gen: NAD, comfortable in exam room  Right thumb: IP joint in extension with band aid splinting.  No malrotation or angulation. Full extension at IP joint.  Able to flex at IP mildly with  pain.  FROM MCP joint. Strength 5/5 with flexion and extension. TTP at A1 pulley with nodule. NVI distally.  Right elbow: No deformity. FROM with 5/5 strength.  Pain with resisted wrist flexion and 3rd digit flexion.  No pain pronation. TTP medial epicondyle.  No other tenderness. NVI distally. Collateral ligaments intact.  Assessment & Plan:  1. Right thumb trigger finger - repeated injection today.  Continue with splinting - switch to aluminum U shaped splint.  Heat.  If not improving will refer to hand surgery.  After informed written consent timeout was performed.  Patient was seated in chair in exam room.  Area overlying right 1st digit A1 pulley prepped with alcohol swab then injected with 0.5:0.85m bupivicaine: depomedrol.  Patient tolerated procedure well without immediate complications.  2. Right elbow pain - 2/2 medial epicondylitis.  Tylenol or aleve if needed, icing.  Shown home exercises to do daily  Sleeve or counterforce brace.  Discussed wrist brace also.  Consider PT, nitro patches, injection if not improving.  F/u in 6 weeks.

## 2018-06-07 ENCOUNTER — Ambulatory Visit: Payer: Medicare HMO | Admitting: Family Medicine

## 2018-09-05 ENCOUNTER — Telehealth: Payer: Self-pay | Admitting: Family Medicine

## 2018-09-05 NOTE — Telephone Encounter (Signed)
Ok to go ahead with referral - has not responded to two injections.  Thanks!

## 2018-09-05 NOTE — Telephone Encounter (Signed)
Patient scheduled with Dr. Caralyn Guile at [facility]

## 2018-09-05 NOTE — Telephone Encounter (Signed)
Patient requesting a referral to a hand surgeon for her trigger finger. Patient states the injection in September helped for about a month but now she is having pain again

## 2019-04-17 ENCOUNTER — Other Ambulatory Visit: Payer: Self-pay | Admitting: Internal Medicine

## 2019-04-17 DIAGNOSIS — N6320 Unspecified lump in the left breast, unspecified quadrant: Secondary | ICD-10-CM

## 2019-04-25 ENCOUNTER — Other Ambulatory Visit: Payer: Medicare HMO

## 2019-05-14 ENCOUNTER — Other Ambulatory Visit: Payer: Medicare HMO

## 2019-06-03 ENCOUNTER — Other Ambulatory Visit: Payer: Medicare HMO

## 2019-06-07 ENCOUNTER — Observation Stay (HOSPITAL_COMMUNITY)
Admission: EM | Admit: 2019-06-07 | Discharge: 2019-06-09 | Disposition: A | Discharge status: Home / Self Care | Payer: Medicare HMO | Attending: Internal Medicine | Admitting: Internal Medicine

## 2019-06-07 ENCOUNTER — Encounter (HOSPITAL_COMMUNITY): Payer: Self-pay

## 2019-06-07 ENCOUNTER — Other Ambulatory Visit: Payer: Self-pay

## 2019-06-07 ENCOUNTER — Emergency Department (HOSPITAL_COMMUNITY): Payer: Medicare HMO

## 2019-06-07 DIAGNOSIS — K922 Gastrointestinal hemorrhage, unspecified: Secondary | ICD-10-CM | POA: Diagnosis not present

## 2019-06-07 DIAGNOSIS — R55 Syncope and collapse: Principal | ICD-10-CM

## 2019-06-07 DIAGNOSIS — E559 Vitamin D deficiency, unspecified: Secondary | ICD-10-CM | POA: Diagnosis not present

## 2019-06-07 DIAGNOSIS — Z87891 Personal history of nicotine dependence: Secondary | ICD-10-CM | POA: Diagnosis not present

## 2019-06-07 DIAGNOSIS — K50911 Crohn's disease, unspecified, with rectal bleeding: Secondary | ICD-10-CM | POA: Diagnosis not present

## 2019-06-07 DIAGNOSIS — G43909 Migraine, unspecified, not intractable, without status migrainosus: Secondary | ICD-10-CM | POA: Insufficient documentation

## 2019-06-07 DIAGNOSIS — K219 Gastro-esophageal reflux disease without esophagitis: Secondary | ICD-10-CM | POA: Diagnosis not present

## 2019-06-07 DIAGNOSIS — F419 Anxiety disorder, unspecified: Secondary | ICD-10-CM | POA: Diagnosis not present

## 2019-06-07 DIAGNOSIS — E785 Hyperlipidemia, unspecified: Secondary | ICD-10-CM | POA: Diagnosis not present

## 2019-06-07 DIAGNOSIS — E876 Hypokalemia: Secondary | ICD-10-CM | POA: Diagnosis not present

## 2019-06-07 DIAGNOSIS — M3313 Other dermatomyositis without myopathy: Secondary | ICD-10-CM | POA: Diagnosis present

## 2019-06-07 DIAGNOSIS — F329 Major depressive disorder, single episode, unspecified: Secondary | ICD-10-CM | POA: Insufficient documentation

## 2019-06-07 DIAGNOSIS — M797 Fibromyalgia: Secondary | ICD-10-CM | POA: Insufficient documentation

## 2019-06-07 DIAGNOSIS — Z7951 Long term (current) use of inhaled steroids: Secondary | ICD-10-CM | POA: Insufficient documentation

## 2019-06-07 DIAGNOSIS — I509 Heart failure, unspecified: Secondary | ICD-10-CM | POA: Insufficient documentation

## 2019-06-07 DIAGNOSIS — Z836 Family history of other diseases of the respiratory system: Secondary | ICD-10-CM | POA: Insufficient documentation

## 2019-06-07 DIAGNOSIS — R609 Edema, unspecified: Secondary | ICD-10-CM

## 2019-06-07 DIAGNOSIS — W07XXXA Fall from chair, initial encounter: Secondary | ICD-10-CM | POA: Insufficient documentation

## 2019-06-07 DIAGNOSIS — Z7982 Long term (current) use of aspirin: Secondary | ICD-10-CM | POA: Insufficient documentation

## 2019-06-07 DIAGNOSIS — N179 Acute kidney failure, unspecified: Secondary | ICD-10-CM | POA: Insufficient documentation

## 2019-06-07 DIAGNOSIS — Z885 Allergy status to narcotic agent status: Secondary | ICD-10-CM | POA: Insufficient documentation

## 2019-06-07 DIAGNOSIS — I110 Hypertensive heart disease with heart failure: Secondary | ICD-10-CM | POA: Diagnosis not present

## 2019-06-07 DIAGNOSIS — D509 Iron deficiency anemia, unspecified: Secondary | ICD-10-CM | POA: Insufficient documentation

## 2019-06-07 DIAGNOSIS — G629 Polyneuropathy, unspecified: Secondary | ICD-10-CM | POA: Insufficient documentation

## 2019-06-07 DIAGNOSIS — Z20828 Contact with and (suspected) exposure to other viral communicable diseases: Secondary | ICD-10-CM | POA: Insufficient documentation

## 2019-06-07 DIAGNOSIS — G7100 Muscular dystrophy, unspecified: Secondary | ICD-10-CM | POA: Insufficient documentation

## 2019-06-07 DIAGNOSIS — Z8719 Personal history of other diseases of the digestive system: Secondary | ICD-10-CM

## 2019-06-07 DIAGNOSIS — Z79899 Other long term (current) drug therapy: Secondary | ICD-10-CM | POA: Insufficient documentation

## 2019-06-07 DIAGNOSIS — J439 Emphysema, unspecified: Secondary | ICD-10-CM | POA: Insufficient documentation

## 2019-06-07 DIAGNOSIS — M3390 Dermatopolymyositis, unspecified, organ involvement unspecified: Secondary | ICD-10-CM | POA: Diagnosis present

## 2019-06-07 DIAGNOSIS — D638 Anemia in other chronic diseases classified elsewhere: Secondary | ICD-10-CM | POA: Diagnosis not present

## 2019-06-07 DIAGNOSIS — D500 Iron deficiency anemia secondary to blood loss (chronic): Secondary | ICD-10-CM | POA: Diagnosis present

## 2019-06-07 DIAGNOSIS — Z882 Allergy status to sulfonamides status: Secondary | ICD-10-CM | POA: Insufficient documentation

## 2019-06-07 DIAGNOSIS — Z888 Allergy status to other drugs, medicaments and biological substances status: Secondary | ICD-10-CM | POA: Insufficient documentation

## 2019-06-07 DIAGNOSIS — Z881 Allergy status to other antibiotic agents status: Secondary | ICD-10-CM | POA: Insufficient documentation

## 2019-06-07 LAB — BASIC METABOLIC PANEL
Anion gap: 10 (ref 5–15)
BUN: 9 mg/dL (ref 8–23)
CO2: 30 mmol/L (ref 22–32)
Calcium: 8.7 mg/dL — ABNORMAL LOW (ref 8.9–10.3)
Chloride: 97 mmol/L — ABNORMAL LOW (ref 98–111)
Creatinine, Ser: 1.18 mg/dL — ABNORMAL HIGH (ref 0.44–1.00)
GFR calc Af Amer: 56 mL/min — ABNORMAL LOW (ref >60)
GFR calc non Af Amer: 48 mL/min — ABNORMAL LOW (ref >60)
Glucose, Bld: 121 mg/dL — ABNORMAL HIGH (ref 70–99)
Potassium: 2.9 mmol/L — ABNORMAL LOW (ref 3.5–5.1)
Sodium: 137 mmol/L (ref 135–145)

## 2019-06-07 LAB — CBC
HCT: 26 % — ABNORMAL LOW (ref 36.0–46.0)
Hemoglobin: 6.8 g/dL — CL (ref 12.0–15.0)
MCH: 20.5 pg — ABNORMAL LOW (ref 26.0–34.0)
MCHC: 26.2 g/dL — ABNORMAL LOW (ref 30.0–36.0)
MCV: 78.5 fL — ABNORMAL LOW (ref 80.0–100.0)
Platelets: 638 10*3/uL — ABNORMAL HIGH (ref 150–400)
RBC: 3.31 MIL/uL — ABNORMAL LOW (ref 3.87–5.11)
RDW: 18.3 % — ABNORMAL HIGH (ref 11.5–15.5)
WBC: 11.2 10*3/uL — ABNORMAL HIGH (ref 4.0–10.5)
nRBC: 0 % (ref 0.0–0.2)

## 2019-06-07 LAB — TROPONIN I (HIGH SENSITIVITY): Troponin I (High Sensitivity): 7 ng/L (ref <18)

## 2019-06-07 LAB — BRAIN NATRIURETIC PEPTIDE: B Natriuretic Peptide: 78 pg/mL (ref 0.0–100.0)

## 2019-06-07 LAB — CBG MONITORING, ED: Glucose-Capillary: 174 mg/dL — ABNORMAL HIGH (ref 70–99)

## 2019-06-07 LAB — PREPARE RBC (CROSSMATCH)

## 2019-06-07 MED ORDER — POTASSIUM CHLORIDE CRYS ER 20 MEQ PO TBCR
40.0000 meq | EXTENDED_RELEASE_TABLET | Freq: Once | ORAL | Status: AC
  Administered 2019-06-07: 40 meq via ORAL
  Filled 2019-06-07: qty 2

## 2019-06-07 MED ORDER — SODIUM CHLORIDE 0.9% FLUSH: 3.0000 mL | Freq: Once | INTRAVENOUS | Status: DC

## 2019-06-07 MED ORDER — SODIUM CHLORIDE 0.9 % IV SOLN: 10.0000 mL/h | Freq: Once | INTRAVENOUS | Status: DC

## 2019-06-07 NOTE — ED Triage Notes (Addendum)
Pt presents to ED with complaints of feeling like she was going to pass out tonight. Pt states she has felt dizziness, fatigue, increased swelling in both lower legs since Wednesday. Pt also c/o bilateral knee pain.

## 2019-06-07 NOTE — ED Notes (Signed)
CRITICAL VALUE ALERT  Critical Value:  Hgb 6.8 Date & Time Notied:06/07/2019 @ 2247 Provider Notified: Dr. Vanita Panda Orders Received/Actions taken: None yet

## 2019-06-07 NOTE — ED Provider Notes (Signed)
[facility] EMERGENCY DEPARTMENT Provider Note   CSN: [id number] Arrival date & time: 06/07/19  2053     History   Chief Complaint Chief Complaint  Patient presents with  . Near Syncope    HPI Kayla Ryan is a 63 y.o. female.     Patient presents to the emergency department with near syncope.  Patient reports that she has had multiple problems this week.  She has been experiencing "irritable bowel symptoms", specifically abdominal cramping and frequent diarrhea.  This is not similar to her Crohn's flares.  She has, however, have been seeing red blood in her stools.  She has a history of chronic blood loss through her GI tract.  Patient has also noticed shortness of breath, chest discomfort as well as swelling of her legs.  She was told at [facility] that she had congestive heart failure but is not sure what that is or how to treat it.  She is not on a diuretic.  Patient reports that she nearly passed out tonight.  She had just stood up and then felt like she was swaying and got very dizzy.  She reports that everything went gray and then she lost her vision but could still hear everybody around her.  She did not fall or lose consciousness.     Past Medical History:  Diagnosis Date  . Arthritis   . Chest pain   . Chronic bronchitis   . COPD (chronic obstructive pulmonary disease) ([place])   . Crohn disease ([place])   . Emphysema   . Fibromyalgia   . Herpes   . Hyperlipidemia   . IBS (irritable bowel syndrome)   . Migraines   . Muscular dystrophy ([place])   . Neck pain   . Plantar fasciitis   . Scoliosis     Patient Active Problem List   Diagnosis Date Noted  . Syncope 06/08/2019  . Hypokalemia 06/08/2019  . Trigger finger, acquired 11/03/2017  . Chronic obstructive pulmonary disease ([place]) 08/23/2017  . Chronic pain 08/23/2017  . History of Crohn's disease 08/23/2017  . Myopathy 08/23/2017  . Right shoulder injury, subsequent encounter 08/23/2017  .  Primary insomnia 02/01/2017  . Esophageal dysphagia 10/06/2016  . Essential hypertension 07/17/2016  . Iron deficiency anemia 03/25/2016  . History of Bell's palsy 03/03/2015  . Nuclear sclerosis of both eyes 03/03/2015  . Allergic rhinitis 05/18/2014  . Anxiety 05/18/2014  . Arthritis 05/18/2014  . Asthma 05/18/2014  . Chronic constipation 05/18/2014  . Dermatomyositis ([place]) 05/18/2014  . Migraine without status migrainosus, not intractable 05/18/2014  . Rectocele 05/18/2014  . 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 Peripheral neuropathy 05/18/2014  . Chest pain 10/30/2013  . Hyperlipidemia 10/30/2013  . Carotid artery disease ([place]) 10/30/2013    Past Surgical History:  Procedure Laterality Date  . ABDOMINAL HYSTERECTOMY    . bone spur    . CHOLECYSTECTOMY    . HERNIA REPAIR    . LEFT HEART CATHETERIZATION WITH CORONARY ANGIOGRAM N/A 11/07/2013   Procedure: LEFT HEART CATHETERIZATION WITH CORONARY ANGIOGRAM;  Surgeon: Lorretta Harp, MD;  Location: [facility] CATH LAB;  Service: Cardiovascular;  Laterality: N/A;  . MASTECTOMY PARTIAL / LUMPECTOMY    . OOPHORECTOMY    . ROTATOR CUFF REPAIR       OB History   No obstetric history on file.      Home Medications    Prior to Admission medications   Medication Sig Start Date End Date Taking? Authorizing  Provider  AMITIZA 24 MCG capsule Take 24 mcg by mouth 2 (two) times daily. 10/06/13   [provider]  aspirin 81 MG tablet Take 81 mg by mouth daily.    [provider]  co-enzyme Q-10 30 MG capsule Take 30 mg by mouth daily.      [provider]  diclofenac sodium (VOLTAREN) 1 % GEL Apply 2 g topically 4 (four) times daily as needed (for pain). 08/21/17   Hudnall, Sharyn Lull, MD  doxepin (SINEQUAN) 10 MG capsule Take 10 mg by mouth at bedtime. 10/11/13   [provider]  DULoxetine (CYMBALTA) 60 MG capsule Take 60 mg by mouth 2 (two) times daily. 10/11/13   [provider]   estradiol (ESTRACE) 1 MG tablet Take 1 mg by mouth daily. 10/05/13   [provider]  fentaNYL (DURAGESIC - DOSED MCG/HR) 50 MCG/HR Place 1 patch onto the skin every other day.     [provider]  fluticasone (FLONASE) 50 MCG/ACT nasal spray Place 1 spray into both nostrils daily as needed for allergies.  10/11/13   [provider]  Fluticasone Furoate-Vilanterol (BREO ELLIPTA) 100-25 MCG/INH AEPB Inhale 1 puff into the lungs daily.    [provider]  gabapentin (NEURONTIN) 300 MG capsule Take 600 mg by mouth 3 (three) times daily.     [provider]  gemfibrozil (LOPID) 600 MG tablet Take 600 mg by mouth 2 (two) times daily before a meal.      [provider]  isosorbide mononitrate (IMDUR) 30 MG 24 hr tablet Take 30 mg by mouth daily. 10/06/13   [provider]  l-methylfolate-B6-B12 (METANX) 3-35-2 MG TABS Take 1 tablet by mouth daily.    [provider]  lidocaine (LIDODERM) 5 % Place 1-2 patches onto the skin daily. Remove & Discard patch within 12 hours or as directed by MD    [provider]  loratadine (CLARITIN) 10 MG tablet Take 10 mg by mouth 2 (two) times daily.      [provider]  LORazepam (ATIVAN) 0.5 MG tablet Take 0.5 mg by mouth 3 (three) times daily.     [provider]  magnesium hydroxide (MILK OF MAGNESIA) 400 MG/5ML suspension Take 15 mLs by mouth at bedtime.    [provider]  Melatonin (MELATONIN TR) 10 MG TBCR Take 10 mg by mouth at bedtime.    [provider]  Melatonin 5 MG TABS Take 5 mg by mouth at bedtime.    [provider]  MELATONIN ER PO Take by mouth.    [provider]  methocarbamol (ROBAXIN) 500 MG tablet Take 500 mg by mouth 3 (three) times daily. 08/12/13   [provider]  montelukast (SINGULAIR) 10 MG tablet Take 10 mg by mouth daily. 10/11/13   [provider]  Multiple Vitamin (MULTIVITAMIN) capsule Take 1  capsule by mouth daily.      [provider]  nitroGLYCERIN (NITRODUR - DOSED IN MG/24 HR) 0.2 mg/hr patch Apply 1/4th patch to affected shoulder, change daily 04/25/18   Hudnall, Sharyn Lull, MD  NITROSTAT 0.4 MG SL tablet Place 0.4 mg under the tongue every 15 (fifteen) minutes as needed for chest pain.  09/22/13   [provider]  omeprazole (PRILOSEC) 40 MG capsule Take 40 mg by mouth 2 (two) times daily.      [provider]  oxyCODONE-acetaminophen (PERCOCET) 10-325 MG per tablet Take 1 tablet by mouth every 8 (eight) hours  as needed for pain.    [provider]  polyethylene glycol powder (GLYCOLAX/MIRALAX) powder Take 4 Containers by mouth daily. 09/23/13   [provider]  PROAIR HFA 108 (90 BASE) MCG/ACT inhaler Inhale 2 puffs into the lungs every 4 (four) hours as needed for wheezing.  10/11/13   [provider]  topiramate (TOPAMAX) 100 MG tablet Take 100 mg by mouth daily.      [provider]  triamterene-hydrochlorothiazide (DYAZIDE) 37.5-25 MG per capsule Take 1 capsule by mouth every morning.      [provider]  valACYclovir (VALTREX) 500 MG tablet Take 500 mg by mouth 2 (two) times daily.      [provider]  Vitamin D, Ergocalciferol, (DRISDOL) 50000 UNITS CAPS capsule Take 1 Units by mouth once a week. 10/18/13   [provider]    Family History Family History  Problem Relation Age of Onset  . Lung cancer Mother   . COPD Father   . Lung cancer Father   . Lymphoma Father     Social History Social History   Tobacco Use  . Smoking status: Former Smoker    Types: Cigarettes  . Smokeless tobacco: Never Used  Substance Use Topics  . Alcohol use: No  . Drug use: No     Allergies   Sulfa antibiotics, Dilaudid [hydromorphone hcl], Metronidazole, Prednisone, Cephalexin, Methotrexate derivatives, and Morphine and related   Review of Systems Review of Systems  Respiratory: Positive for  shortness of breath.   Cardiovascular: Positive for chest pain and leg swelling.  Gastrointestinal: Positive for abdominal pain, blood in stool and diarrhea.  Neurological: Positive for dizziness. Negative for headaches.  All other systems reviewed and are negative.    Physical Exam Updated Vital Signs BP (!) 157/71   Pulse 81   Temp 98.6 F (37 C) (Oral)   Resp 14   Ht 5' 8"  (1.727 m)   Wt 72.6 kg   SpO2 100%   BMI 24.33 kg/m   Physical Exam Vitals signs and nursing note reviewed.  Constitutional:      General: She is not in acute distress.    Appearance: Normal appearance. She is well-developed.  HENT:     Head: Normocephalic and atraumatic.     Right Ear: Hearing normal.     Left Ear: Hearing normal.     Nose: Nose normal.  Eyes:     Conjunctiva/sclera: Conjunctivae normal.     Pupils: Pupils are equal, round, and reactive to light.  Neck:     Musculoskeletal: Normal range of motion and neck supple.  Cardiovascular:     Rate and Rhythm: Regular rhythm.     Heart sounds: S1 normal and S2 normal. No murmur. No friction rub. No gallop.   Pulmonary:     Effort: Pulmonary effort is normal. No respiratory distress.     Breath sounds: Normal breath sounds.  Chest:     Chest wall: No tenderness.  Abdominal:     General: Bowel sounds are normal.     Palpations: Abdomen is soft.     Tenderness: There is no abdominal tenderness. There is no guarding or rebound. Negative signs include Murphy's sign and McBurney's sign.     Hernia: No hernia is present.  Musculoskeletal: Normal range of motion.     Right lower leg: Edema present.     Left lower leg: Edema present.  Skin:    General: Skin is warm and dry.  Findings: No rash.  Neurological:     Mental Status: She is alert and oriented to person, place, and time.     GCS: GCS eye subscore is 4. GCS verbal subscore is 5. GCS motor subscore is 6.     Cranial Nerves: No cranial nerve deficit.     Sensory: No sensory  deficit.     Coordination: Coordination normal.  Psychiatric:        Speech: Speech normal.        Behavior: Behavior normal.        Thought Content: Thought content normal.      ED Treatments / Results  Labs (all labs ordered are listed, but only abnormal results are displayed) Labs Reviewed  BASIC METABOLIC PANEL - Abnormal; Notable for the following components:      Result Value   Potassium 2.9 (*)    Chloride 97 (*)    Glucose, Bld 121 (*)    Creatinine, Ser 1.18 (*)    Calcium 8.7 (*)    GFR calc non Af Amer 48 (*)    GFR calc Af Amer 56 (*)    All other components within normal limits  CBC - Abnormal; Notable for the following components:   WBC 11.2 (*)    RBC 3.31 (*)    Hemoglobin 6.8 (*)    HCT 26.0 (*)    MCV 78.5 (*)    MCH 20.5 (*)    MCHC 26.2 (*)    RDW 18.3 (*)    Platelets 638 (*)    All other components within normal limits  URINALYSIS, ROUTINE W REFLEX MICROSCOPIC - Abnormal; Notable for the following components:   Color, Urine STRAW (*)    All other components within normal limits  CBG MONITORING, ED - Abnormal; Notable for the following components:   Glucose-Capillary 174 (*)    All other components within normal limits  SARS CORONAVIRUS 2 (TAT 6-24 HRS)  BRAIN NATRIURETIC PEPTIDE  HIV ANTIBODY (ROUTINE TESTING W REFLEX)  COMPREHENSIVE METABOLIC PANEL  CBC  PREPARE RBC (CROSSMATCH)  TYPE AND SCREEN  ABO/RH  TROPONIN I (HIGH SENSITIVITY)  TROPONIN I (HIGH SENSITIVITY)    EKG None  Radiology Ct Head Wo Contrast  Result Date: 06/08/2019 CLINICAL DATA:  63 year old female with syncope. EXAM: CT HEAD WITHOUT CONTRAST TECHNIQUE: Contiguous axial images were obtained from the base of the skull through the vertex without intravenous contrast. COMPARISON:  Head CT dated 12/15/2008. FINDINGS: Brain: The ventricles and sulci appropriate size for patient's age. The gray-white matter discrimination is preserved. There is no acute intracranial  hemorrhage. No mass effect or midline shift. No extra-axial fluid collection. Vascular: No hyperdense vessel or unexpected calcification. Skull: Normal. Negative for fracture or focal lesion. Sinuses/Orbits: No acute finding. Other: None IMPRESSION: Unremarkable noncontrast CT of the brain. Electronically Signed   By: Anner Crete M.D.   On: 06/08/2019 03:52   Dg Abd Acute 2+v W 1v Chest  Result Date: 06/07/2019 CLINICAL DATA:  Abdominal cramps, diarrhea, leg edema EXAM: DG ABDOMEN ACUTE W/ 1V CHEST COMPARISON:  CT abdomen pelvis 08/13/2018, radiograph 03/05/1999 FINDINGS: There is no evidence of dilated bowel loops or free intraperitoneal air. Cholecystectomy clips present in the right upper quadrant. No radiopaque calculi or other significant radiographic abnormality is seen. Heart size and mediastinal contours are within normal limits. Both lungs are clear. IMPRESSION: Negative abdominal radiographs. No acute cardiopulmonary abnormality. Post cholecystectomy. Electronically Signed   By: Lovena Le M.D.   On: 06/07/2019  23:46    Procedures Procedures (including critical care time)  Medications Ordered in ED Medications  sodium chloride flush (NS) 0.9 % injection 3 mL (0 mLs Intravenous Hold 06/08/19 0259)  0.9 %  sodium chloride infusion (0 mL/hr Intravenous Hold 06/08/19 0300)  aspirin tablet 81 mg (has no administration in time range)  valACYclovir (VALTREX) tablet 500 mg (has no administration in time range)  gemfibrozil (LOPID) tablet 600 mg (has no administration in time range)  isosorbide mononitrate (IMDUR) 24 hr tablet 30 mg (has no administration in time range)  triamterene-hydrochlorothiazide (DYAZIDE) 37.5-25 MG per capsule 1 capsule (has no administration in time range)  doxepin (SINEQUAN) capsule 10 mg (has no administration in time range)  DULoxetine (CYMBALTA) DR capsule 60 mg (has no administration in time range)  LORazepam (ATIVAN) tablet 0.5 mg (has no administration  in time range)  magnesium hydroxide (MILK OF MAGNESIA) suspension 15 mL (has no administration in time range)  pantoprazole (PROTONIX) EC tablet 40 mg (has no administration in time range)  Melatonin TABS 5 mg (has no administration in time range)  gabapentin (NEURONTIN) capsule 600 mg (has no administration in time range)  methocarbamol (ROBAXIN) tablet 500 mg (has no administration in time range)  topiramate (TOPAMAX) tablet 100 mg (has no administration in time range)  l-methylfolate-B6-B12 (METANX) 3-35-2 MG per tablet 1 tablet (has no administration in time range)  fluticasone (FLONASE) 50 MCG/ACT nasal spray 1 spray (has no administration in time range)  loratadine (CLARITIN) tablet 10 mg (has no administration in time range)  montelukast (SINGULAIR) tablet 10 mg (has no administration in time range)  albuterol (VENTOLIN HFA) 108 (90 Base) MCG/ACT inhaler 2 puff (has no administration in time range)  acetaminophen (TYLENOL) tablet 650 mg (has no administration in time range)    Or  acetaminophen (TYLENOL) suppository 650 mg (has no administration in time range)  oxyCODONE (OXYCONTIN) 12 hr tablet 40 mg (has no administration in time range)  oxyCODONE (Oxy IR/ROXICODONE) immediate release tablet 15 mg (has no administration in time range)  potassium chloride SA (KLOR-CON) CR tablet 40 mEq (has no administration in time range)  potassium chloride SA (KLOR-CON) CR tablet 40 mEq (40 mEq Oral Given 06/07/19 2344)     Initial Impression / Assessment and Plan / ED Course  I have reviewed the triage vital signs and the nursing notes.  Pertinent labs & imaging results that were available during my care of the patient were reviewed by me and considered in my medical decision making (see chart for details).       Patient presents to the emergency department with multiple complaints.  Patient had a near syncopal episode tonight.  This is likely multifactorial, including her medications,  however she is found to be very anemic.  She has a history of chronic blood loss anemia secondary to her Crohn's disease.  She reports that she has been noticing a lot of bleeding this week but has a benign abdominal exam.  Will require blood transfusion.  Patient has significant pitting edema of both lower extremities.  She reports that she has been told that she has congestive heart failure, however I cannot find the previous diagnosis or any cardiac studies.  Based on this, would be hesitant to simply transfuse and discharge her.  She does not require work-up for the blood loss but will require cardiac evaluation for possible volume overload and congestive heart failure.  Final Clinical Impressions(s) / ED Diagnoses   Final diagnoses:  Syncope  ED Discharge Orders    None       Orpah Greek, MD 06/08/19 [phone]

## 2019-06-07 NOTE — ED Notes (Signed)
ED Provider at bedside. 

## 2019-06-08 ENCOUNTER — Observation Stay (HOSPITAL_COMMUNITY): Payer: Medicare HMO

## 2019-06-08 ENCOUNTER — Observation Stay (HOSPITAL_BASED_OUTPATIENT_CLINIC_OR_DEPARTMENT_OTHER): Payer: Medicare HMO

## 2019-06-08 ENCOUNTER — Encounter (HOSPITAL_COMMUNITY): Payer: Self-pay | Admitting: Internal Medicine

## 2019-06-08 DIAGNOSIS — D509 Iron deficiency anemia, unspecified: Secondary | ICD-10-CM

## 2019-06-08 DIAGNOSIS — R55 Syncope and collapse: Secondary | ICD-10-CM | POA: Diagnosis present

## 2019-06-08 DIAGNOSIS — E876 Hypokalemia: Secondary | ICD-10-CM | POA: Diagnosis present

## 2019-06-08 DIAGNOSIS — Z8719 Personal history of other diseases of the digestive system: Secondary | ICD-10-CM

## 2019-06-08 LAB — COMPREHENSIVE METABOLIC PANEL
ALT: 14 U/L (ref 0–44)
AST: 19 U/L (ref 15–41)
Albumin: 3.8 g/dL (ref 3.5–5.0)
Alkaline Phosphatase: 57 U/L (ref 38–126)
Anion gap: 7 (ref 5–15)
BUN: 9 mg/dL (ref 8–23)
CO2: 27 mmol/L (ref 22–32)
Calcium: 8.4 mg/dL — ABNORMAL LOW (ref 8.9–10.3)
Chloride: 101 mmol/L (ref 98–111)
Creatinine, Ser: 1.01 mg/dL — ABNORMAL HIGH (ref 0.44–1.00)
GFR calc Af Amer: >60 mL/min (ref >60)
GFR calc non Af Amer: 58 mL/min — ABNORMAL LOW (ref >60)
Glucose, Bld: 115 mg/dL — ABNORMAL HIGH (ref 70–99)
Potassium: 4.1 mmol/L (ref 3.5–5.1)
Sodium: 135 mmol/L (ref 135–145)
Total Bilirubin: 0.7 mg/dL (ref 0.3–1.2)
Total Protein: 7.3 g/dL (ref 6.5–8.1)

## 2019-06-08 LAB — URINALYSIS, ROUTINE W REFLEX MICROSCOPIC
Bilirubin Urine: NEGATIVE
Glucose, UA: NEGATIVE mg/dL
Hgb urine dipstick: NEGATIVE
Ketones, ur: NEGATIVE mg/dL
Leukocytes,Ua: NEGATIVE
Nitrite: NEGATIVE
Protein, ur: NEGATIVE mg/dL
Specific Gravity, Urine: 1.005 (ref 1.005–1.030)
pH: 7 (ref 5.0–8.0)

## 2019-06-08 LAB — CBC
HCT: 27.7 % — ABNORMAL LOW (ref 36.0–46.0)
Hemoglobin: 7.8 g/dL — ABNORMAL LOW (ref 12.0–15.0)
MCH: 21.9 pg — ABNORMAL LOW (ref 26.0–34.0)
MCHC: 28.2 g/dL — ABNORMAL LOW (ref 30.0–36.0)
MCV: 77.8 fL — ABNORMAL LOW (ref 80.0–100.0)
Platelets: 509 10*3/uL — ABNORMAL HIGH (ref 150–400)
RBC: 3.56 MIL/uL — ABNORMAL LOW (ref 3.87–5.11)
RDW: 18 % — ABNORMAL HIGH (ref 11.5–15.5)
WBC: 9.1 10*3/uL (ref 4.0–10.5)
nRBC: 0 % (ref 0.0–0.2)

## 2019-06-08 LAB — ECHOCARDIOGRAM COMPLETE
Height: 68 in
Weight: 2560 oz

## 2019-06-08 LAB — MAGNESIUM: Magnesium: 2.6 mg/dL — ABNORMAL HIGH (ref 1.7–2.4)

## 2019-06-08 LAB — TROPONIN I (HIGH SENSITIVITY): Troponin I (High Sensitivity): 8 ng/L (ref <18)

## 2019-06-08 LAB — SARS CORONAVIRUS 2 (TAT 6-24 HRS): SARS Coronavirus 2: NEGATIVE

## 2019-06-08 LAB — ABO/RH: ABO/RH(D): A NEG

## 2019-06-08 MED ORDER — ALBUTEROL SULFATE HFA 108 (90 BASE) MCG/ACT IN AERS: 2.0000 | INHALATION_SPRAY | RESPIRATORY_TRACT | Status: DC | PRN

## 2019-06-08 MED ORDER — DOXEPIN HCL 10 MG PO CAPS
10.0000 mg | ORAL_CAPSULE | Freq: Every day | ORAL | Status: DC
  Filled 2019-06-08: qty 1

## 2019-06-08 MED ORDER — OXYCODONE HCL 5 MG PO TABS: 15.0000 mg | ORAL_TABLET | Freq: Two times a day (BID) | ORAL | Status: DC | PRN

## 2019-06-08 MED ORDER — MAGNESIUM HYDROXIDE 400 MG/5ML PO SUSP: 15.0000 mL | Freq: Every day | ORAL | Status: DC

## 2019-06-08 MED ORDER — TRIAMTERENE-HCTZ 37.5-25 MG PO CAPS: 1.0000 | ORAL_CAPSULE | ORAL | Status: DC

## 2019-06-08 MED ORDER — GABAPENTIN 300 MG PO CAPS
600.0000 mg | ORAL_CAPSULE | Freq: Three times a day (TID) | ORAL | Status: DC
  Administered 2019-06-08 (×3): 600 mg via ORAL
  Filled 2019-06-08 (×3): qty 2

## 2019-06-08 MED ORDER — ACETAMINOPHEN 325 MG PO TABS: 650.0000 mg | ORAL_TABLET | Freq: Four times a day (QID) | ORAL | Status: DC | PRN

## 2019-06-08 MED ORDER — FLUTICASONE PROPIONATE 50 MCG/ACT NA SUSP
1.0000 | Freq: Every day | NASAL | Status: DC | PRN
  Filled 2019-06-08: qty 16

## 2019-06-08 MED ORDER — MONTELUKAST SODIUM 10 MG PO TABS
10.0000 mg | ORAL_TABLET | Freq: Every day | ORAL | Status: DC
  Administered 2019-06-08: 14:00:00 10 mg via ORAL
  Filled 2019-06-08: qty 1

## 2019-06-08 MED ORDER — TOPIRAMATE 25 MG PO TABS
100.0000 mg | ORAL_TABLET | Freq: Every day | ORAL | Status: DC
  Administered 2019-06-08: 14:00:00 100 mg via ORAL
  Filled 2019-06-08: qty 4

## 2019-06-08 MED ORDER — DULOXETINE HCL 30 MG PO CPEP
60.0000 mg | ORAL_CAPSULE | Freq: Two times a day (BID) | ORAL | Status: DC
  Administered 2019-06-08 (×3): 60 mg via ORAL
  Filled 2019-06-08 (×3): qty 2

## 2019-06-08 MED ORDER — METHOCARBAMOL 500 MG PO TABS
500.0000 mg | ORAL_TABLET | Freq: Three times a day (TID) | ORAL | Status: DC
  Administered 2019-06-08 (×3): 500 mg via ORAL
  Filled 2019-06-08 (×3): qty 1

## 2019-06-08 MED ORDER — LORAZEPAM 0.5 MG PO TABS
0.5000 mg | ORAL_TABLET | Freq: Three times a day (TID) | ORAL | Status: DC
  Administered 2019-06-08 (×3): 0.5 mg via ORAL
  Filled 2019-06-08 (×3): qty 1

## 2019-06-08 MED ORDER — POTASSIUM CHLORIDE CRYS ER 20 MEQ PO TBCR
20.0000 meq | EXTENDED_RELEASE_TABLET | Freq: Once | ORAL | Status: AC
  Administered 2019-06-08: 08:00:00 20 meq via ORAL
  Filled 2019-06-08: qty 1

## 2019-06-08 MED ORDER — ISOSORBIDE MONONITRATE ER 30 MG PO TB24
30.0000 mg | ORAL_TABLET | Freq: Every day | ORAL | Status: DC
  Administered 2019-06-08: 30 mg via ORAL
  Filled 2019-06-08 (×3): qty 1

## 2019-06-08 MED ORDER — ASPIRIN EC 81 MG PO TBEC
81.0000 mg | DELAYED_RELEASE_TABLET | Freq: Every day | ORAL | Status: DC
  Administered 2019-06-08: 14:00:00 81 mg via ORAL
  Filled 2019-06-08: qty 1

## 2019-06-08 MED ORDER — POTASSIUM CHLORIDE CRYS ER 20 MEQ PO TBCR
40.0000 meq | EXTENDED_RELEASE_TABLET | Freq: Once | ORAL | Status: AC
  Administered 2019-06-08: 07:00:00 40 meq via ORAL
  Filled 2019-06-08: qty 2

## 2019-06-08 MED ORDER — PANTOPRAZOLE SODIUM 40 MG PO TBEC
40.0000 mg | DELAYED_RELEASE_TABLET | Freq: Every day | ORAL | Status: DC
  Administered 2019-06-08: 40 mg via ORAL
  Filled 2019-06-08: qty 1

## 2019-06-08 MED ORDER — OXYCODONE HCL ER 10 MG PO T12A
40.0000 mg | EXTENDED_RELEASE_TABLET | Freq: Two times a day (BID) | ORAL | Status: DC
  Administered 2019-06-08 (×2): 40 mg via ORAL
  Filled 2019-06-08 (×2): qty 4

## 2019-06-08 MED ORDER — MELATONIN 5 MG PO TABS
5.0000 mg | ORAL_TABLET | Freq: Every day | ORAL | Status: DC
  Filled 2019-06-08: qty 1

## 2019-06-08 MED ORDER — LORATADINE 10 MG PO TABS
10.0000 mg | ORAL_TABLET | Freq: Two times a day (BID) | ORAL | Status: DC
  Administered 2019-06-08: 10 mg via ORAL
  Filled 2019-06-08 (×2): qty 1

## 2019-06-08 MED ORDER — ACETAMINOPHEN 650 MG RE SUPP: 650.0000 mg | Freq: Four times a day (QID) | RECTAL | Status: DC | PRN

## 2019-06-08 MED ORDER — GEMFIBROZIL 600 MG PO TABS
600.0000 mg | ORAL_TABLET | Freq: Two times a day (BID) | ORAL | Status: DC
  Administered 2019-06-08 (×2): 600 mg via ORAL
  Filled 2019-06-08 (×5): qty 1

## 2019-06-08 MED ORDER — L-METHYLFOLATE-B6-B12 3-35-2 MG PO TABS
1.0000 | ORAL_TABLET | Freq: Every day | ORAL | Status: DC
  Filled 2019-06-08 (×2): qty 1

## 2019-06-08 MED ORDER — VALACYCLOVIR HCL 500 MG PO TABS
500.0000 mg | ORAL_TABLET | Freq: Two times a day (BID) | ORAL | Status: DC
  Administered 2019-06-08 (×3): 500 mg via ORAL
  Filled 2019-06-08 (×3): qty 1

## 2019-06-08 NOTE — Progress Notes (Signed)
*  PRELIMINARY RESULTS* Echocardiogram 2D Echocardiogram has been performed.  Leavy Cella 06/08/2019, 9:53 AM

## 2019-06-08 NOTE — Progress Notes (Addendum)
Xcover Pt states that she fell out of chair , but RN states she witnessed this and that the patient actually lowered herself to the ground  Pt states that she has some generalized pain , xrays ordered by RN  Exam:  axoxo3 (person, place, halloween)  Heent: anicteric, atraumatic Neck: supple, no tenderness, good ROM Heart: rrr s1, s2 Lung: ctab Abd: soft, nt, nd, +bs Ext: no c/c/e, trace edema right distal lower ext Good ROM of left shoulder, bilateral knees, arms and wrist.  No laceration Negative homans  A/P ?Fall xrays Monitor  Edema Ultrasound bilateral LE r/o DVT

## 2019-06-08 NOTE — ED Notes (Signed)
Patient removes EKG leads, BP cuff repeatedly.

## 2019-06-08 NOTE — H&P (Addendum)
TRH H&P    Patient Demographics:    Kayla Ryan, is a 63 y.o. female  MRN: [id number]  DOB - [birth year]  Admit Date - 06/07/2019  Referring MD/NP/PA:   Betsey Holiday  Outpatient Primary MD for the patient is [name], PA-C  Patient coming from:  home  Chief complaint- syncope   HPI:    Kayla Ryan  is a 63 y.o. female,  w hyperlipidemia, fibromyalgia,  muscular dystrophy, crohn's disease, ibs, copd, who presents with c/o syncope .  Pt states has felt lightheaded.  Pt has had increase in pain , generalized since this past Wednesday.  Pt has been lifting and since then has had chest pain as part of constellation of generalized pain.  Pt denies fever, chills, cough, palp, sob, n/v, abd pain.  Pt notes brbpr since Wednesday. Blood on toilet paper and in toilet bowel.   Pt has had brbpr in the past.  Last colonoscopy about 5 years ago per patient.    In ED,  T 98.1  P 92  R 18, Bp 126/48  Pox 94% on RA Wt 72.6kg  Abd xray + 1V chest IMPRESSION: Negative abdominal radiographs. No acute cardiopulmonary abnormality.  Post cholecystectomy.  Na 137, K 2.9 Bun 9, Creatinine 1.18 Wbc 11.2, Hgb 6.8, Mcv 78, Rdw 18, Plt 638 Trop 7 BNP 78  Pt will be admitted for syncope, symptomatic anemia.    Review of systems:    In addition to the HPI above,  No Fever-chills, No Headache, No changes with Vision or hearing, No problems swallowing food or Liquids, No   Cough or Shortness of Breath, No Abdominal pain, No Nausea or Vomiting, bowel movements are regular, No Blood in stool or Urine, No dysuria, No new skin rashes or bruises, No new joints pains-aches,  No new weakness, tingling, numbness in any extremity, No recent weight gain or loss, No polyuria, polydypsia or polyphagia, No significant Mental Stressors.  All other systems reviewed and are negative.    Past History of the following :     Past Medical History:  Diagnosis Date   Arthritis    Chest pain    Chronic bronchitis    COPD (chronic obstructive pulmonary disease) (HCC)    Crohn disease (HCC)    Emphysema    Fibromyalgia    Herpes    Hyperlipidemia    IBS (irritable bowel syndrome)    Migraines    Muscular dystrophy ([place])    Neck pain    Plantar fasciitis    Scoliosis       Past Surgical History:  Procedure Laterality Date   ABDOMINAL HYSTERECTOMY     bone spur     CHOLECYSTECTOMY     HERNIA REPAIR     LEFT HEART CATHETERIZATION WITH CORONARY ANGIOGRAM N/A 11/07/2013   Procedure: LEFT HEART CATHETERIZATION WITH CORONARY ANGIOGRAM;  Surgeon: Lorretta Harp, MD;  Location: [facility] CATH LAB;  Service: Cardiovascular;  Laterality: N/A;   MASTECTOMY PARTIAL / LUMPECTOMY     OOPHORECTOMY     ROTATOR  CUFF REPAIR        Social History:      Social History   Tobacco Use   Smoking status: Former Smoker    Types: Cigarettes   Smokeless tobacco: Never Used  Substance Use Topics   Alcohol use: No       Family History :    History reviewed. No pertinent family history.  + muscular dystrophy   Home Medications:   Prior to Admission medications   Medication Sig Start Date End Date Taking? Authorizing Provider  AMITIZA 24 MCG capsule Take 24 mcg by mouth 2 (two) times daily. 10/06/13   [provider]  aspirin 81 MG tablet Take 81 mg by mouth daily.    [provider]  co-enzyme Q-10 30 MG capsule Take 30 mg by mouth daily.      [provider]  diclofenac sodium (VOLTAREN) 1 % GEL Apply 2 g topically 4 (four) times daily as needed (for pain). 08/21/17   Hudnall, Sharyn Lull, MD  doxepin (SINEQUAN) 10 MG capsule Take 10 mg by mouth at bedtime. 10/11/13   [provider]  DULoxetine (CYMBALTA) 60 MG capsule Take 60 mg by mouth 2 (two) times daily. 10/11/13   [provider]  estradiol (ESTRACE) 1 MG tablet Take 1 mg by mouth daily. 10/05/13    [provider]  fentaNYL (DURAGESIC - DOSED MCG/HR) 50 MCG/HR Place 1 patch onto the skin every other day.     [provider]  fluticasone (FLONASE) 50 MCG/ACT nasal spray Place 1 spray into both nostrils daily as needed for allergies.  10/11/13   [provider]  Fluticasone Furoate-Vilanterol (BREO ELLIPTA) 100-25 MCG/INH AEPB Inhale 1 puff into the lungs daily.    [provider]  gabapentin (NEURONTIN) 300 MG capsule Take 600 mg by mouth 3 (three) times daily.     [provider]  gemfibrozil (LOPID) 600 MG tablet Take 600 mg by mouth 2 (two) times daily before a meal.      [provider]  isosorbide mononitrate (IMDUR) 30 MG 24 hr tablet Take 30 mg by mouth daily. 10/06/13   [provider]  l-methylfolate-B6-B12 (METANX) 3-35-2 MG TABS Take 1 tablet by mouth daily.    [provider]  lidocaine (LIDODERM) 5 % Place 1-2 patches onto the skin daily. Remove & Discard patch within 12 hours or as directed by MD    [provider]  loratadine (CLARITIN) 10 MG tablet Take 10 mg by mouth 2 (two) times daily.      [provider]  LORazepam (ATIVAN) 0.5 MG tablet Take 0.5 mg by mouth 3 (three) times daily.     [provider]  magnesium hydroxide (MILK OF MAGNESIA) 400 MG/5ML suspension Take 15 mLs by mouth at bedtime.    [provider]  Melatonin (MELATONIN TR) 10 MG TBCR Take 10 mg by mouth at bedtime.    [provider]  Melatonin 5 MG TABS Take 5 mg by mouth at bedtime.    [provider]  MELATONIN ER PO Take by mouth.    [provider]  methocarbamol (ROBAXIN) 500 MG tablet Take 500 mg by mouth 3 (three) times daily. 08/12/13   [provider]  montelukast (SINGULAIR) 10 MG tablet Take 10 mg by mouth daily. 10/11/13   [provider]  Multiple Vitamin (MULTIVITAMIN) capsule Take 1 capsule by mouth daily.      [provider]   nitroGLYCERIN (NITRODUR -  DOSED IN MG/24 HR) 0.2 mg/hr patch Apply 1/4th patch to affected shoulder, change daily 04/25/18   Hudnall, Sharyn Lull, MD  NITROSTAT 0.4 MG SL tablet Place 0.4 mg under the tongue every 15 (fifteen) minutes as needed for chest pain.  09/22/13   [provider]  omeprazole (PRILOSEC) 40 MG capsule Take 40 mg by mouth 2 (two) times daily.      [provider]  oxyCODONE-acetaminophen (PERCOCET) 10-325 MG per tablet Take 1 tablet by mouth every 8 (eight) hours as needed for pain.    [provider]  polyethylene glycol powder (GLYCOLAX/MIRALAX) powder Take 4 Containers by mouth daily. 09/23/13   [provider]  PROAIR HFA 108 (90 BASE) MCG/ACT inhaler Inhale 2 puffs into the lungs every 4 (four) hours as needed for wheezing.  10/11/13   [provider]  topiramate (TOPAMAX) 100 MG tablet Take 100 mg by mouth daily.      [provider]  triamterene-hydrochlorothiazide (DYAZIDE) 37.5-25 MG per capsule Take 1 capsule by mouth every morning.      [provider]  valACYclovir (VALTREX) 500 MG tablet Take 500 mg by mouth 2 (two) times daily.      [provider]  Vitamin D, Ergocalciferol, (DRISDOL) 50000 UNITS CAPS capsule Take 1 Units by mouth once a week. 10/18/13   [provider]     Allergies:     Allergies  Allergen Reactions   Sulfa Antibiotics Shortness Of Breath, Swelling and Rash   Dilaudid [Hydromorphone Hcl]     Makes me crazy   Metronidazole Diarrhea and Nausea And Vomiting   Prednisone Other (See Comments)    angry angry    Cephalexin Diarrhea and Nausea And Vomiting   Methotrexate Derivatives Swelling and Rash   Morphine And Related Anxiety    "exreme mood swings"     Physical Exam:   Vitals  Blood pressure (!) 122/57, pulse 81, temperature 98.8 F (37.1 C), temperature source Oral, resp. rate 11, height 5' 8"  (1.727 m), weight 72.6 kg, SpO2 96 %.  1.  General:   axoxo3  2. Psychiatric: euthymic  3. Neurologic: cn2-12 intact, reflexes 2+ symmetric, diffuse with no clonus, motor 5/5 in all 4 ext  4. HEENMT:  Anicteric, pupils 1.63m symmetric, direct, consensual, near intact Neck: no jvd  5. Respiratory : CTAB  6. Cardiovascular : rrr s1, s2, no m/g/r  7. Gastrointestinal:  Abd: soft, nt, nd, +bs  8. Skin:  Ext: no c/c/e,  No rash  9.Musculoskeletal:  Good ROM    Data Review:    CBC Recent Labs  Lab 06/07/19 2215  WBC 11.2*  HGB 6.8*  HCT 26.0*  PLT 638*  MCV 78.5*  MCH 20.5*  MCHC 26.2*  RDW 18.3*   ------------------------------------------------------------------------------------------------------------------  Results for orders placed or performed during the hospital encounter of 06/07/19 (from the past 48 hour(s))  CBG monitoring, ED     Status: Abnormal   Collection Time: 06/07/19  9:28 PM  Result Value Ref Range   Glucose-Capillary 174 (H) 70 - 99 mg/dL  Basic metabolic panel     Status: Abnormal   Collection Time: 06/07/19 10:15 PM  Result Value Ref Range   Sodium 137 135 - 145 mmol/L   Potassium 2.9 (L) 3.5 - 5.1 mmol/L   Chloride 97 (L) 98 - 111 mmol/L   CO2 30 22 - 32 mmol/L   Glucose, Bld 121 (H) 70 - 99 mg/dL   BUN 9 8 -  23 mg/dL   Creatinine, Ser 1.18 (H) 0.44 - 1.00 mg/dL   Calcium 8.7 (L) 8.9 - 10.3 mg/dL   GFR calc non Af Amer 48 (L) >60 mL/min   GFR calc Af Amer 56 (L) >60 mL/min   Anion gap 10 5 - 15    Comment: Performed at [facility], [address] [place], [place] [address]  CBC     Status: Abnormal   Collection Time: 06/07/19 10:15 PM  Result Value Ref Range   WBC 11.2 (H) 4.0 - 10.5 K/uL   RBC 3.31 (L) 3.87 - 5.11 MIL/uL   Hemoglobin 6.8 (LL) 12.0 - 15.0 g/dL    Comment: CRITICAL RESULT CALLED TO, READ BACK BY AND VERIFIED WITH: This critical result has verified and been called to [facility] by Duncan Dull on 10 30 2020 at 2243, and has been read back.     HCT 26.0 (L) 36.0 -  46.0 %   MCV 78.5 (L) 80.0 - 100.0 fL   MCH 20.5 (L) 26.0 - 34.0 pg   MCHC 26.2 (L) 30.0 - 36.0 g/dL   RDW 18.3 (H) 11.5 - 15.5 %   Platelets 638 (H) 150 - 400 K/uL   nRBC 0.0 0.0 - 0.2 %    Comment: Performed at [facility], [address], [place], [place] [address]  Brain natriuretic peptide     Status: None   Collection Time: 06/07/19 10:15 PM  Result Value Ref Range   B Natriuretic Peptide 78.0 0.0 - 100.0 pg/mL    Comment: Performed at [facility], [address] [place], [place] [address]  Troponin I (High Sensitivity)     Status: None   Collection Time: 06/07/19 10:15 PM  Result Value Ref Range   Troponin I (High Sensitivity) 7 <18 ng/L    Comment: (NOTE) Elevated high sensitivity troponin I (hsTnI) values and significant  changes across serial measurements may suggest ACS but many other  chronic and acute conditions are known to elevate hsTnI results.  Refer to the "Links" section for chest pain algorithms and additional  guidance. Performed at [facility], [address] [place], [place] [address]   Prepare RBC     Status: None   Collection Time: 06/07/19 11:28 PM  Result Value Ref Range   Order Confirmation      ORDER PROCESSED BY BLOOD BANK Performed at [facility], [address], [place], [place] [address]   Type and screen     Status: None (Preliminary result)   Collection Time: 06/07/19 11:28 PM  Result Value Ref Range   ABO/RH(D) A NEG    Antibody Screen NEG    Sample Expiration 06/10/2019,2359    Unit Number [id number]    Blood Component Type RED CELLS,LR    Unit division 00    Status of Unit ISSUED    Transfusion Status OK TO TRANSFUSE    Crossmatch Result      Compatible Performed at [facility], [address], [place], [place] [address]   ABO/Rh     Status: None   Collection Time: 06/07/19 11:28 PM  Result Value Ref Range   ABO/RH(D)      A NEG Performed at [facility], [address], [place], [place] [address]   Urinalysis, Routine w  reflex microscopic     Status: Abnormal   Collection Time: 06/08/19  1:03 AM  Result Value Ref Range   Color, Urine STRAW (A) YELLOW   APPearance CLEAR CLEAR   Specific  Gravity, Urine 1.005 1.005 - 1.030   pH 7.0 5.0 - 8.0   Glucose, UA NEGATIVE NEGATIVE mg/dL   Hgb urine dipstick NEGATIVE NEGATIVE   Bilirubin Urine NEGATIVE NEGATIVE   Ketones, ur NEGATIVE NEGATIVE mg/dL   Protein, ur NEGATIVE NEGATIVE mg/dL   Nitrite NEGATIVE NEGATIVE   Leukocytes,Ua NEGATIVE NEGATIVE    Comment: Performed at [facility], [address] [place], [place] [address]  Troponin I (High Sensitivity)     Status: None   Collection Time: 06/08/19  1:14 AM  Result Value Ref Range   Troponin I (High Sensitivity) 8 <18 ng/L    Comment: (NOTE) Elevated high sensitivity troponin I (hsTnI) values and significant  changes across serial measurements may suggest ACS but many other  chronic and acute conditions are known to elevate hsTnI results.  Refer to the "Links" section for chest pain algorithms and additional  guidance. Performed at [facility], [address], [place], [place] [address]     Chemistries  Recent Labs  Lab 06/07/19 2215  NA 137  K 2.9*  CL 97*  CO2 30  GLUCOSE 121*  BUN 9  CREATININE 1.18*  CALCIUM 8.7*   ------------------------------------------------------------------------------------------------------------------  ------------------------------------------------------------------------------------------------------------------ GFR: Estimated Creatinine Clearance: 47.9 mL/min (A) (by C-G formula based on SCr of 1.18 mg/dL (H)). Liver Function Tests: No results for input(s): AST, ALT, ALKPHOS, BILITOT, PROT, ALBUMIN in the last 168 hours. No results for input(s): LIPASE, AMYLASE in the last 168 hours. No results for input(s): AMMONIA in the last 168 hours. Coagulation Profile: No results for input(s): INR, PROTIME in the last 168 hours. Cardiac Enzymes: No results for  input(s): CKTOTAL, CKMB, CKMBINDEX, TROPONINI in the last 168 hours. BNP (last 3 results) No results for input(s): PROBNP in the last 8760 hours. HbA1C: No results for input(s): HGBA1C in the last 72 hours. CBG: Recent Labs  Lab 06/07/19 2128  GLUCAP 174*   Lipid Profile: No results for input(s): CHOL, HDL, LDLCALC, TRIG, CHOLHDL, LDLDIRECT in the last 72 hours. Thyroid Function Tests: No results for input(s): TSH, T4TOTAL, FREET4, T3FREE, THYROIDAB in the last 72 hours. Anemia Panel: No results for input(s): VITAMINB12, FOLATE, FERRITIN, TIBC, IRON, RETICCTPCT in the last 72 hours.  --------------------------------------------------------------------------------------------------------------- Urine analysis:    Component Value Date/Time   COLORURINE STRAW (A) 06/08/2019 0103   APPEARANCEUR CLEAR 06/08/2019 0103   LABSPEC 1.005 06/08/2019 0103   PHURINE 7.0 06/08/2019 0103   GLUCOSEU NEGATIVE 06/08/2019 0103   HGBUR NEGATIVE 06/08/2019 0103   BILIRUBINUR NEGATIVE 06/08/2019 0103   KETONESUR NEGATIVE 06/08/2019 0103   PROTEINUR NEGATIVE 06/08/2019 0103   UROBILINOGEN 0.2 06/08/2011 1400   NITRITE NEGATIVE 06/08/2019 0103   LEUKOCYTESUR NEGATIVE 06/08/2019 0103      Imaging Results:    Dg Abd Acute 2+v W 1v Chest  Result Date: 06/07/2019 CLINICAL DATA:  Abdominal cramps, diarrhea, leg edema EXAM: DG ABDOMEN ACUTE W/ 1V CHEST COMPARISON:  CT abdomen pelvis 08/13/2018, radiograph 03/05/1999 FINDINGS: There is no evidence of dilated bowel loops or free intraperitoneal air. Cholecystectomy clips present in the right upper quadrant. No radiopaque calculi or other significant radiographic abnormality is seen. Heart size and mediastinal contours are within normal limits. Both lungs are clear. IMPRESSION: Negative abdominal radiographs. No acute cardiopulmonary abnormality. Post cholecystectomy. Electronically Signed   By: Lovena Le M.D.   On: 06/07/2019 23:46   nsr at 80, nl  axis, nl int, incomplete RBBB, no st-t changes c/w ischemia   Assessment & Plan:  Principal Problem:   Syncope Active Problems:   Iron deficiency anemia   Dermatomyositis (HCC)   History of Crohn's disease   Hypokalemia  Syncope ? Secondary to anemia, ddx pain medication  CT brain  Check carotid ultrasound Check cardiac echo  Trop I q2h x2  Iron deficiency anemia Pt transfused in ED Check cbc in am  Crohns disease, Blood per rectum, chronic issue per patient Check cbc in am  Hyperlipidemia Cont Gemfibrozil 625m po bid  H/o Chest pain Cont Imdur 372mpo qday Cont aspirin  Gerd  Cont PPI  FM Cont Cymbalta 6044mo bid Cont Doxepin 15m21m qhs Cont Gabapentin Cont Robaxin 500mg47mtid Cont current pain medication , may need to decrease   Anxiety Cont Lorazepam 0.5mg p30mid  Migraines Cont topiramate 100mg p36may   Copd Cont Albuterol HFA  Vitamin D def Resume vitamin D as outpatient   DVT Prophylaxis-   Lovenox - SCDs   AM Labs Ordered, also please review Full Orders  Family Communication: Admission, patients condition and plan of care including tests being ordered have been discussed with the patient  who indicate understanding and agree with the plan and Code Status.  Code Status:  FULL CODE per patient  Admission status: Observation: Based on patients clinical presentation and evaluation of above clinical data, I have made determination that patient meets observation criteria at this time.   Time spent in minutes : 70 minutes   Sameera Betton KJani Gravel 06/08/2019 at 2:32 AM

## 2019-06-08 NOTE — Progress Notes (Signed)
Patient seen and examined. Admitted after midnight secondary to near syncope. Found with hypokalemia, AKI and acute on chronic anemia due to chronic GIB. Patient has been transfused and repeat CBC pending. Vital signs are stable currently, CT head neg for acute intracranial abnormalities and no acute cardiopulmonary process seen on x-ray. Carotid dopplers and 2-D echo ordered and pending. Please refer to H&P written by Dr. Maudie Mercury for further info/details on admission.  Plan: -continue electrolytes repletion -continue telemetry monitoring -troponin neg X 2 and no BNP elevation on bloodwork. -will follow pending results (echo and dopplers) and repeat CBC and BMET -hopefully discharge in the next 24 hours.   Barton Dubois MD [phone]

## 2019-06-08 NOTE — ED Notes (Addendum)
Pt found crying in floor. States she fell out of chair. C/o L shoulder, L shin, R knee, and R forearm pain. Also c/o having to wait for admission bed. Wants to leave AMA. DR Maudie Mercury notified of fall and pain complaints. Awaiting further orders.

## 2019-06-08 NOTE — ED Notes (Signed)
ED Provider at bedside. 

## 2019-06-09 ENCOUNTER — Observation Stay (HOSPITAL_COMMUNITY): Payer: Medicare HMO

## 2019-06-09 DIAGNOSIS — Z8719 Personal history of other diseases of the digestive system: Secondary | ICD-10-CM | POA: Diagnosis not present

## 2019-06-09 DIAGNOSIS — M3390 Dermatopolymyositis, unspecified, organ involvement unspecified: Secondary | ICD-10-CM | POA: Diagnosis not present

## 2019-06-09 DIAGNOSIS — E876 Hypokalemia: Secondary | ICD-10-CM

## 2019-06-09 DIAGNOSIS — D500 Iron deficiency anemia secondary to blood loss (chronic): Secondary | ICD-10-CM

## 2019-06-09 DIAGNOSIS — R55 Syncope and collapse: Secondary | ICD-10-CM | POA: Diagnosis not present

## 2019-06-09 LAB — TYPE AND SCREEN
ABO/RH(D): A NEG
Antibody Screen: NEGATIVE
Unit division: 0

## 2019-06-09 LAB — BASIC METABOLIC PANEL
Anion gap: 9 (ref 5–15)
BUN: 14 mg/dL (ref 8–23)
CO2: 26 mmol/L (ref 22–32)
Calcium: 8.9 mg/dL (ref 8.9–10.3)
Chloride: 103 mmol/L (ref 98–111)
Creatinine, Ser: 1.08 mg/dL — ABNORMAL HIGH (ref 0.44–1.00)
GFR calc Af Amer: >60 mL/min (ref >60)
GFR calc non Af Amer: 54 mL/min — ABNORMAL LOW (ref >60)
Glucose, Bld: 92 mg/dL (ref 70–99)
Potassium: 4.9 mmol/L (ref 3.5–5.1)
Sodium: 138 mmol/L (ref 135–145)

## 2019-06-09 LAB — BPAM RBC
Blood Product Expiration Date: 202011132359
ISSUE DATE / TIME: 202010310051
Unit Type and Rh: 600

## 2019-06-09 LAB — CBC
HCT: 26.6 % — ABNORMAL LOW (ref 36.0–46.0)
Hemoglobin: 7.3 g/dL — ABNORMAL LOW (ref 12.0–15.0)
MCH: 21.9 pg — ABNORMAL LOW (ref 26.0–34.0)
MCHC: 27.4 g/dL — ABNORMAL LOW (ref 30.0–36.0)
MCV: 79.9 fL — ABNORMAL LOW (ref 80.0–100.0)
Platelets: 470 10*3/uL — ABNORMAL HIGH (ref 150–400)
RBC: 3.33 MIL/uL — ABNORMAL LOW (ref 3.87–5.11)
RDW: 18.6 % — ABNORMAL HIGH (ref 11.5–15.5)
WBC: 7.8 10*3/uL (ref 4.0–10.5)
nRBC: 0 % (ref 0.0–0.2)

## 2019-06-09 LAB — HIV ANTIBODY (ROUTINE TESTING W REFLEX): HIV Screen 4th Generation wRfx: NONREACTIVE

## 2019-06-09 MED ORDER — FUROSEMIDE 20 MG PO TABS: 20.0000 mg | ORAL_TABLET | Freq: Every day | ORAL | 1 refills | Status: DC

## 2019-06-09 MED ORDER — B COMPLEX-C PO TABS
1.0000 | ORAL_TABLET | Freq: Every day | ORAL | Status: DC
  Filled 2019-06-09 (×2): qty 1

## 2019-06-09 MED ORDER — POLYETHYLENE GLYCOL 3350 17 G PO PACK: 17.0000 g | PACK | Freq: Every day | ORAL | 0 refills | Status: DC

## 2019-06-09 MED ORDER — ASPIRIN 81 MG PO TABS: 81.0000 mg | ORAL_TABLET | Freq: Every day | ORAL | Status: DC

## 2019-06-09 MED ORDER — POTASSIUM CHLORIDE ER 20 MEQ PO TBCR: 20.0000 meq | EXTENDED_RELEASE_TABLET | Freq: Every day | ORAL | 0 refills | Status: DC

## 2019-06-09 MED ORDER — ALBUTEROL SULFATE HFA 108 (90 BASE) MCG/ACT IN AERS: 2.0000 | INHALATION_SPRAY | Freq: Four times a day (QID) | RESPIRATORY_TRACT | 1 refills | Status: DC | PRN

## 2019-06-09 MED ORDER — MELATONIN 3 MG PO TABS: 6.0000 mg | ORAL_TABLET | Freq: Every day | ORAL | Status: DC

## 2019-06-09 NOTE — ED Notes (Signed)
Pt again laid herself in the floor in front of several staff members. Pt started to scream and say that she had fell again and hit her nose. Staff assisted pt back up and back into bed.

## 2019-06-09 NOTE — Discharge Summary (Signed)
Physician Discharge Summary  Kayla Ryan WJX:[id number] DOB: 1955/09/28 DOA: 06/07/2019  PCP: [name], PA-C  Admit date: 06/07/2019 Discharge date: 06/09/2019  Time spent: 35 minutes  Recommendations for Outpatient Follow-up:  1. Repeat CBC to follow Hgb trend 2. Repeat BMET to follow electrolytes    Discharge Diagnoses:  Principal Problem:   Syncope Active Problems:   Iron deficiency anemia   Dermatomyositis ([place])   History of Crohn's disease   Hypokalemia   Discharge Condition: stable. Patient discharge home with instructions to follow up with PCP in 1 week.   Diet recommendation: heart healthy diet  Filed Weights   06/07/19 2122  Weight: 72.6 kg    History of present illness:  63 y.o. female,  w hyperlipidemia, fibromyalgia,  muscular dystrophy, crohn's disease, ibs, copd, who presents with c/o syncope .  Pt states has felt lightheaded.  Pt has had increase in pain , generalized since this past Wednesday.  Pt has been lifting and since then has had chest pain as part of constellation of generalized pain.  Pt denies fever, chills, cough, palp, sob, n/v, abd pain.  Pt notes brbpr since Wednesday. Blood on toilet paper and in toilet bowel.   Pt has had brbpr in the past.  Last colonoscopy about 5 years ago per patient.    In ED,  T 98.1  P 92  R 18, Bp 126/48  Pox 94% on RA Wt 72.6kg  Abd xray + 1V chest IMPRESSION: Negative abdominal radiographs. No acute cardiopulmonary abnormality.  Post cholecystectomy.  Na 137, K 2.9 Bun 9, Creatinine 1.18 Wbc 11.2, Hgb 6.8, Mcv 78, Rdw 18, Plt 638 Trop 7 BNP 78  Pt will be admitted for syncope, symptomatic anemia.   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   -continue outpatient follow up with GI service -repeat CBC in 1 week at PCP follow up visit to reassess Hgb trend  2-HLD -continue gemfibrozil -heart healthy diet encouraged  3-Hx of CP -no acute ischemic changes on EKG -reassuring 2-D echo -continue ASA and imdur  4-GERD -continue PPI  5-depression/anxiety and fibromyalgia -resume home medicaiton regimen -patient will benefit of psychiatry referral and medicaiton adjustment as an outpatient   6-COPD -stable and well controlled -continue PRN albuterol and flonase  7-fall -patient expressed falling out of chair during hospitalization -extensive x-ray survey performed  -no acute fracture appreciated.  8-HTN -BP stable -continue home antihypertensive regimen; except for HCTZ, which has been substituted for lasix.   Procedures:  See below for x-ray reports   2-D echo: normal eF, no wall motion abnormalities; no significant valvular disease.   Consultations:  None   Discharge Exam: Vitals:   06/09/19 0656 06/09/19 0931  BP: 140/60 (!) 161/92  Pulse: (!) 101 (!) 101  Resp: 19 17  Temp: 98.8 F (37.1 C) 98.6 F (37 C)  SpO2: 93% 99%    General: afebrile, oriented X3; reports some muscular pain; no focal weakness. Cardiovascular: RRR, no rubs, no gallops, no JVD. Respiratory: CTA bilaterally. Normal resp effort, no using accessory muscles.  Abdomen: soft, NT, ND, positive BS Extremities: no cyanosis no clubbing, trace pedal edema.   Discharge Instructions   Discharge Instructions    Diet - low sodium heart healthy   Complete by: As directed  Discharge instructions   Complete by: As directed    Take medications as prescribed Maintain adequate hydration Follow up with PCP in 1 week Follow heart healthy diet, use Ted hoses as discussed and keepgood intervals for legs elevation     Allergies as of 06/09/2019      Reactions   Sulfa Antibiotics Shortness Of Breath, Swelling, Rash   Dilaudid  [hydromorphone Hcl]    Makes me crazy   Metronidazole Diarrhea, Nausea And Vomiting   Prednisone Other (See Comments)   angry angry   Cephalexin Diarrhea, Nausea And Vomiting   Methotrexate Derivatives Swelling, Rash   Morphine And Related Anxiety   "exreme mood swings"      Medication List    STOP taking these medications   polyethylene glycol powder 17 GM/SCOOP powder Commonly known as: GLYCOLAX/MIRALAX Replaced by: polyethylene glycol 17 g packet   triamterene-hydrochlorothiazide 37.5-25 MG capsule Commonly known as: DYAZIDE     TAKE these medications   albuterol 108 (90 Base) MCG/ACT inhaler Commonly known as: ProAir HFA Inhale 2 puffs into the lungs every 6 (six) hours as needed for wheezing or shortness of breath. What changed:   when to take this  reasons to take this   aspirin 81 MG tablet Take 1 tablet (81 mg total) by mouth daily.   Coenzyme Q10 10 MG capsule Take 40 mg by mouth daily. 4 capsules po qd   diclofenac sodium 1 % Gel Commonly known as: VOLTAREN Apply 2 g topically 4 (four) times daily as needed (for pain).   doxepin 10 MG capsule Commonly known as: SINEQUAN Take 10 mg by mouth at bedtime.   DULoxetine 60 MG capsule Commonly known as: CYMBALTA Take 60 mg by mouth 2 (two) times daily.   estradiol 1 MG tablet Commonly known as: ESTRACE Take 1 mg by mouth daily.   fluticasone 50 MCG/ACT nasal spray Commonly known as: FLONASE Place 1 spray into both nostrils daily as needed for allergies.   furosemide 20 MG tablet Commonly known as: Lasix Take 1 tablet (20 mg total) by mouth daily.   gemfibrozil 600 MG tablet Commonly known as: LOPID Take 600 mg by mouth 2 (two) times daily before a meal.   isosorbide mononitrate 30 MG 24 hr tablet Commonly known as: IMDUR Take 30 mg by mouth daily.   l-methylfolate-B6-B12 3-35-2 MG Tabs tablet Commonly known as: METANX Take 1 tablet by mouth daily.   loratadine 10 MG tablet Commonly known  as: CLARITIN Take 10 mg by mouth 2 (two) times daily.   LORazepam 0.5 MG tablet Commonly known as: ATIVAN Take 0.5 mg by mouth 3 (three) times daily.   magnesium hydroxide 400 MG/5ML suspension Commonly known as: MILK OF MAGNESIA Take 15 mLs by mouth at bedtime.   Melatonin TR 10 MG Tbcr Generic drug: Melatonin Take 10 mg by mouth at bedtime.   methocarbamol 500 MG tablet Commonly known as: ROBAXIN Take 500 mg by mouth 3 (three) times daily.   montelukast 10 MG tablet Commonly known as: SINGULAIR Take 10 mg by mouth daily.   multivitamin capsule Take 1 capsule by mouth daily.   Nitrostat 0.4 MG SL tablet Generic drug: nitroGLYCERIN Place 0.4 mg under the tongue every 15 (fifteen) minutes as needed for chest pain. What changed: Another medication with the same name was removed. Continue taking this medication, and follow the directions you see here.   omeprazole 40 MG capsule Commonly known as: PRILOSEC Take 40 mg by mouth 2 (two)  times daily.   oxyCODONE 5 MG immediate release tablet Commonly known as: Oxy IR/ROXICODONE   polyethylene glycol 17 g packet Commonly known as: MiraLax Take 17 g by mouth daily. Replaces: polyethylene glycol powder 17 GM/SCOOP powder   Potassium Chloride ER 20 MEQ Tbcr Take 20 mEq by mouth daily.   valACYclovir 500 MG tablet Commonly known as: VALTREX Take 500 mg by mouth 2 (two) times daily.   Vitamin D (Ergocalciferol) 1.25 MG (50000 UT) Caps capsule Commonly known as: DRISDOL Take 1 Units by mouth once a week.   Xtampza ER 18 MG C12a Generic drug: oxyCODONE ER Take 1 capsule by mouth 2 (two) times daily.      Allergies  Allergen Reactions  . Sulfa Antibiotics Shortness Of Breath, Swelling and Rash  . Dilaudid [Hydromorphone Hcl]     Makes me crazy  . Metronidazole Diarrhea and Nausea And Vomiting  . Prednisone Other (See Comments)    angry angry   . Cephalexin Diarrhea and Nausea And Vomiting  . Methotrexate  Derivatives Swelling and Rash  . Morphine And Related Anxiety    "exreme mood swings"   Follow-up Information    Bulla, Elenore Rota, PA-C. Schedule an appointment as soon as possible for a visit.   Specialty: Internal Medicine Contact information: [address] [place] [address] [phone]           The results of significant diagnostics from this hospitalization (including imaging, microbiology, ancillary and laboratory) are listed below for reference.    Significant Diagnostic Studies: Dg Ribs Unilateral W/chest Left  Result Date: 06/08/2019 CLINICAL DATA:  [place] out of a chair, RIGHT forearm pain, BILATERAL knee pain, lower leg pain, foot pain, anterior lower LEFT rib pain, LEFT shoulder pain EXAM: LEFT RIBS AND CHEST - 3+ VIEW COMPARISON:  Chest radiograph 07/17/2016 FINDINGS: Upper normal heart size. Mediastinal contours and pulmonary vascularity normal. Mild LEFT basilar atelectasis. Lungs otherwise clear. No infiltrate, pleural effusion, or pneumothorax. Diffuse osseous demineralization. BB placed at site of symptoms lower LEFT chest. No rib fracture or bone destruction. IMPRESSION: Mild LEFT basilar atelectasis. No acute LEFT rib abnormalities. Electronically Signed   By: Lavonia Dana M.D.   On: 06/08/2019 23:03   Dg Shoulder 1 View Left  Result Date: 06/08/2019 CLINICAL DATA:  [place] out of a chair, RIGHT forearm pain, BILATERAL knee pain, lower leg pain, foot pain, anterior lower LEFT rib pain, LEFT shoulder pain EXAM: LEFT SHOULDER - 1 VIEW COMPARISON:  None FINDINGS: Osseous demineralization. Degenerative changes LEFT AC joint. Visualized LEFT ribs intact. No fracture or dislocation identified on single AP view. IMPRESSION: Osseous demineralization with degenerative changes of LEFT AC joint. No acute abnormalities. Electronically Signed   By: Lavonia Dana M.D.   On: 06/08/2019 23:00   Dg Forearm Right  Result Date: 06/08/2019 CLINICAL DATA:  [place] out of a chair, RIGHT  forearm pain, BILATERAL knee pain, lower leg pain, foot pain, anterior lower LEFT rib pain, LEFT shoulder pain EXAM: RIGHT FOREARM - 2 VIEW COMPARISON:  None FINDINGS: Osseous demineralization. Wrist and elbow joint alignments normal. No acute fracture, dislocation, or bone destruction. IMPRESSION: No acute osseous abnormalities. Electronically Signed   By: Lavonia Dana M.D.   On: 06/08/2019 23:01   Dg Tibia/fibula Left  Result Date: 06/08/2019 CLINICAL DATA:  Pain status post fall EXAM: LEFT TIBIA AND FIBULA - 2 VIEW COMPARISON:  None. FINDINGS: There is no evidence of fracture or other focal bone lesions. Soft tissues are unremarkable. IMPRESSION:  Negative. Electronically Signed   By: Constance Holster M.D.   On: 06/08/2019 23:00   Ct Head Wo Contrast  Result Date: 06/08/2019 CLINICAL DATA:  63 year old female with syncope. EXAM: CT HEAD WITHOUT CONTRAST TECHNIQUE: Contiguous axial images were obtained from the base of the skull through the vertex without intravenous contrast. COMPARISON:  Head CT dated 12/15/2008. FINDINGS: Brain: The ventricles and sulci appropriate size for patient's age. The gray-white matter discrimination is preserved. There is no acute intracranial hemorrhage. No mass effect or midline shift. No extra-axial fluid collection. Vascular: No hyperdense vessel or unexpected calcification. Skull: Normal. Negative for fracture or focal lesion. Sinuses/Orbits: No acute finding. Other: None IMPRESSION: Unremarkable noncontrast CT of the brain. Electronically Signed   By: Anner Crete M.D.   On: 06/08/2019 03:52   US Carotid Bilateral  Result Date: 06/08/2019 CLINICAL DATA:  Syncope and visual disturbance. EXAM: BILATERAL CAROTID DUPLEX ULTRASOUND TECHNIQUE: Pearline Cables scale imaging, color Doppler and duplex ultrasound were performed of bilateral carotid and vertebral arteries in the neck. COMPARISON:  None. FINDINGS: Criteria: Quantification of carotid stenosis is based on velocity  parameters that correlate the residual internal carotid diameter with NASCET-based stenosis levels, using the diameter of the distal internal carotid lumen as the denominator for stenosis measurement. The following velocity measurements were obtained: RIGHT ICA:  104/28 cm/sec CCA:  56/43 cm/sec SYSTOLIC ICA/CCA RATIO:  1.2 ECA:  143 cm/sec LEFT ICA:  164/53 cm/sec CCA:  329/51 cm/sec SYSTOLIC ICA/CCA RATIO:  1.5 ECA:  154 cm/sec RIGHT CAROTID ARTERY: Mild partially calcified plaque at the level of the distal bulb and proximal right ICA. Estimated right ICA stenosis is less than 50%. RIGHT VERTEBRAL ARTERY: Antegrade flow with normal waveform and velocity. LEFT CAROTID ARTERY: Mild partially calcified plaque in the proximal left ICA. Mildly elevated velocities technically correspond to a 50-69% estimated left ICA stenosis. Stenosis is felt to be likely 50% or less based on the mild amount of plaque present. LEFT VERTEBRAL ARTERY: Antegrade flow with normal waveform and velocity. IMPRESSION: 1. Mild plaque in the proximal left ICA. Although velocities technically correspond to an estimated 50-69% stenosis, estimated stenosis is felt to more likely be 50% or less based on the mild amount of plaque present. 2. Mild plaque at the level of the right carotid bulb and proximal right ICA. Estimated right ICA stenosis is less than 50%. Electronically Signed   By: Aletta Edouard M.D.   On: 06/08/2019 10:57   Dg Knee Complete 4 Views Right  Result Date: 06/08/2019 CLINICAL DATA:  63 year old female with fall and trauma to the right knee. EXAM: RIGHT KNEE - COMPLETE 4+ VIEW COMPARISON:  None. FINDINGS: There is no acute fracture or dislocation. The bones are osteopenic. No significant arthritic changes. No joint effusion. The soft tissues are unremarkable. IMPRESSION: No acute fracture or dislocation. Electronically Signed   By: Anner Crete M.D.   On: 06/08/2019 23:00   Dg Abd Acute 2+v W 1v Chest  Result Date:  06/07/2019 CLINICAL DATA:  Abdominal cramps, diarrhea, leg edema EXAM: DG ABDOMEN ACUTE W/ 1V CHEST COMPARISON:  CT abdomen pelvis 08/13/2018, radiograph 03/05/1999 FINDINGS: There is no evidence of dilated bowel loops or free intraperitoneal air. Cholecystectomy clips present in the right upper quadrant. No radiopaque calculi or other significant radiographic abnormality is seen. Heart size and mediastinal contours are within normal limits. Both lungs are clear. IMPRESSION: Negative abdominal radiographs. No acute cardiopulmonary abnormality. Post cholecystectomy. Electronically Signed   By: Elwin Sleight.D.  On: 06/07/2019 23:46   Dg Foot Complete Right  Result Date: 06/08/2019 CLINICAL DATA:  Pain status post fall EXAM: RIGHT FOOT COMPLETE - 3+ VIEW COMPARISON:  None. FINDINGS: There is no evidence of fracture or dislocation. There is no evidence of arthropathy or other focal bone abnormality. Soft tissues are unremarkable. IMPRESSION: Negative. Electronically Signed   By: Constance Holster M.D.   On: 06/08/2019 22:59    Microbiology: Recent Results (from the past 240 hour(s))  SARS CORONAVIRUS 2 (TAT 6-24 HRS) Nasopharyngeal Nasopharyngeal Swab     Status: None   Collection Time: 06/07/19 11:45 PM   Specimen: Nasopharyngeal Swab  Result Value Ref Range Status   SARS Coronavirus 2 NEGATIVE NEGATIVE Final    Comment: (NOTE) SARS-CoV-2 target nucleic acids are NOT DETECTED. The SARS-CoV-2 RNA is generally detectable in upper and lower respiratory specimens during the acute phase of infection. Negative results do not preclude SARS-CoV-2 infection, do not rule out co-infections with other pathogens, and should not be used as the sole basis for treatment or other patient management decisions. Negative results must be combined with clinical observations, patient history, and epidemiological information. The expected result is Negative. Fact Sheet for  Patients: SugarRoll.be Fact Sheet for Healthcare Providers: https://www.woods-mathews.com/ This test is not yet approved or cleared by the [place] FDA and  has been authorized for detection and/or diagnosis of SARS-CoV-2 by FDA under an Emergency Use Authorization (EUA). This EUA will remain  in effect (meaning this test can be used) for the duration of the COVID-19 declaration under Section 56 4(b)(1) of the Act, 21 U.S.C. section 360bbb-3(b)(1), unless the authorization is terminated or revoked sooner. Performed at [facility], [place] [address] [place], [place] [address]      Labs: Basic Metabolic Panel: Recent Labs  Lab 06/07/19 2215 06/08/19 0114 06/08/19 1009 06/09/19 0802  NA 137  --  135 138  K 2.9*  --  4.1 4.9  CL 97*  --  101 103  CO2 30  --  27 26  GLUCOSE 121*  --  115* 92  BUN 9  --  9 14  CREATININE 1.18*  --  1.01* 1.08*  CALCIUM 8.7*  --  8.4* 8.9  MG  --  2.6*  --   --    Liver Function Tests: Recent Labs  Lab 06/08/19 1009  AST 19  ALT 14  ALKPHOS 57  BILITOT 0.7  PROT 7.3  ALBUMIN 3.8   CBC: Recent Labs  Lab 06/07/19 2215 06/08/19 1009 06/09/19 0802  WBC 11.2* 9.1 7.8  HGB 6.8* 7.8* 7.3*  HCT 26.0* 27.7* 26.6*  MCV 78.5* 77.8* 79.9*  PLT 638* 509* 470*   BNP (last 3 results) Recent Labs    06/07/19 2215  BNP 78.0    CBG: Recent Labs  Lab 06/07/19 2128  GLUCAP 174*     Signed:  Barton Dubois MD.  [facility] 06/09/2019, 10:26 AM

## 2019-06-12 ENCOUNTER — Other Ambulatory Visit: Payer: Medicare HMO

## 2019-10-31 ENCOUNTER — Emergency Department (HOSPITAL_COMMUNITY): Payer: Medicare Other

## 2019-10-31 ENCOUNTER — Other Ambulatory Visit: Payer: Self-pay

## 2019-10-31 ENCOUNTER — Encounter (HOSPITAL_COMMUNITY): Payer: Self-pay | Admitting: Emergency Medicine

## 2019-10-31 ENCOUNTER — Emergency Department (HOSPITAL_COMMUNITY)
Admission: EM | Admit: 2019-10-31 | Discharge: 2019-10-31 | Disposition: A | Discharge status: Home / Self Care | Payer: Medicare Other | Attending: Emergency Medicine | Admitting: Emergency Medicine

## 2019-10-31 DIAGNOSIS — S0990XA Unspecified injury of head, initial encounter: Secondary | ICD-10-CM | POA: Diagnosis not present

## 2019-10-31 DIAGNOSIS — T07XXXA Unspecified multiple injuries, initial encounter: Secondary | ICD-10-CM

## 2019-10-31 DIAGNOSIS — S9031XA Contusion of right foot, initial encounter: Secondary | ICD-10-CM | POA: Diagnosis not present

## 2019-10-31 DIAGNOSIS — J449 Chronic obstructive pulmonary disease, unspecified: Secondary | ICD-10-CM | POA: Insufficient documentation

## 2019-10-31 DIAGNOSIS — Z79899 Other long term (current) drug therapy: Secondary | ICD-10-CM | POA: Insufficient documentation

## 2019-10-31 DIAGNOSIS — S99921A Unspecified injury of right foot, initial encounter: Secondary | ICD-10-CM | POA: Diagnosis present

## 2019-10-31 DIAGNOSIS — Y999 Unspecified external cause status: Secondary | ICD-10-CM | POA: Diagnosis not present

## 2019-10-31 DIAGNOSIS — Z7982 Long term (current) use of aspirin: Secondary | ICD-10-CM | POA: Insufficient documentation

## 2019-10-31 DIAGNOSIS — Y92009 Unspecified place in unspecified non-institutional (private) residence as the place of occurrence of the external cause: Secondary | ICD-10-CM | POA: Insufficient documentation

## 2019-10-31 DIAGNOSIS — I10 Essential (primary) hypertension: Secondary | ICD-10-CM | POA: Diagnosis not present

## 2019-10-31 DIAGNOSIS — Y93E2 Activity, laundry: Secondary | ICD-10-CM | POA: Insufficient documentation

## 2019-10-31 DIAGNOSIS — M545 Low back pain: Secondary | ICD-10-CM | POA: Insufficient documentation

## 2019-10-31 DIAGNOSIS — W19XXXA Unspecified fall, initial encounter: Secondary | ICD-10-CM | POA: Diagnosis not present

## 2019-10-31 DIAGNOSIS — Z87891 Personal history of nicotine dependence: Secondary | ICD-10-CM | POA: Diagnosis not present

## 2019-10-31 DIAGNOSIS — S50811A Abrasion of right forearm, initial encounter: Secondary | ICD-10-CM | POA: Insufficient documentation

## 2019-10-31 DIAGNOSIS — Z23 Encounter for immunization: Secondary | ICD-10-CM | POA: Diagnosis not present

## 2019-10-31 MED ORDER — OXYCODONE HCL 5 MG PO TABS
5.0000 mg | ORAL_TABLET | Freq: Once | ORAL | Status: AC
  Administered 2019-10-31: 5 mg via ORAL
  Filled 2019-10-31: qty 1

## 2019-10-31 MED ORDER — DIPHENHYDRAMINE HCL 25 MG PO CAPS
50.0000 mg | ORAL_CAPSULE | Freq: Once | ORAL | Status: AC
  Administered 2019-10-31: 50 mg via ORAL
  Filled 2019-10-31: qty 2

## 2019-10-31 MED ORDER — TETANUS-DIPHTH-ACELL PERTUSSIS 5-2.5-18.5 LF-MCG/0.5 IM SUSP
0.5000 mL | Freq: Once | INTRAMUSCULAR | Status: AC
  Administered 2019-10-31: 0.5 mL via INTRAMUSCULAR
  Filled 2019-10-31: qty 0.5

## 2019-10-31 NOTE — Discharge Instructions (Addendum)
Use ice and tylenol as needed for pain. Return for new concerns. You will be sore for a few days.

## 2019-10-31 NOTE — ED Notes (Signed)
Patient taken to xray.

## 2019-10-31 NOTE — ED Provider Notes (Signed)
Patient CARE signed out to follow-up imaging results.  X-rays reviewed no acute fracture, CT scans reviewed no acute fracture or bleeding.  Patient stable for outpatient follow-up.  Patient has pain meds chronically that she is taking.  Golda Acre, MD 10/31/19 [phone]

## 2019-10-31 NOTE — ED Triage Notes (Signed)
Pt was doing laundry when she fell backwards hitting a computer table with her R arm (has some small abrasions) and her R upper buttocks area. C/o pain to R buttock and R hip that travels down back of R leg.

## 2019-10-31 NOTE — ED Provider Notes (Signed)
[facility] EMERGENCY DEPARTMENT Provider Note   CSN: [id number] Arrival date & time: 10/31/19  0543   History Chief Complaint  Patient presents with  . Fall    Kayla Ryan is a 64 y.o. female.  The history is provided by the patient.  Fall  She has history of muscular dystrophy, chronic pain and comes in following a fall at home.  She states that she reached to pick something off the floor and as she got up, she lost her balance and fell backward.  Also, something from a desk fell and landed on her right foot.  He is complaining of pain in her upper and lower back, both shoulders, both elbows and right foot.  She is also suffered an abrasion of her right forearm.  She does not know when her last tetanus immunization was.  She denies loss of consciousness.  She rates her pain at 8/10.  Of note, she has pre-existing right shoulder pain secondary to frozen shoulder.  Past Medical History:  Diagnosis Date  . Arthritis   . Chest pain   . Chronic bronchitis   . COPD (chronic obstructive pulmonary disease) ([place])   . Crohn disease ([place])   . Emphysema   . Fibromyalgia   . Herpes   . Hyperlipidemia   . IBS (irritable bowel syndrome)   . Migraines   . Muscular dystrophy ([place])   . Neck pain   . Plantar fasciitis   . Scoliosis     Patient Active Problem List   Diagnosis Date Noted  . Syncope 06/08/2019  . Hypokalemia 06/08/2019  . Trigger finger, acquired 11/03/2017  . Chronic obstructive pulmonary disease ([place]) 08/23/2017  . Chronic pain 08/23/2017  . History of Crohn's disease 08/23/2017  . Myopathy 08/23/2017  . Right shoulder injury, subsequent encounter 08/23/2017  . Primary insomnia 02/01/2017  . Esophageal dysphagia 10/06/2016  . Essential hypertension 07/17/2016  . Iron deficiency anemia 03/25/2016  . History of Bell's palsy 03/03/2015  . Nuclear sclerosis of both eyes 03/03/2015  . Allergic rhinitis 05/18/2014  . Anxiety 05/18/2014  . Arthritis 05/18/2014  .  Asthma 05/18/2014  . Chronic constipation 05/18/2014  . Dermatomyositis ([place]) 05/18/2014  . Migraine without status migrainosus, not intractable 05/18/2014  . Rectocele 05/18/2014  . 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 Peripheral neuropathy 05/18/2014  . Chest pain 10/30/2013  . Hyperlipidemia 10/30/2013  . Carotid artery disease ([place]) 10/30/2013    Past Surgical History:  Procedure Laterality Date  . ABDOMINAL HYSTERECTOMY    . bone spur    . CHOLECYSTECTOMY    . HERNIA REPAIR    . LEFT HEART CATHETERIZATION WITH CORONARY ANGIOGRAM N/A 11/07/2013   Procedure: LEFT HEART CATHETERIZATION WITH CORONARY ANGIOGRAM;  Surgeon: Lorretta Harp, MD;  Location: [facility] CATH LAB;  Service: Cardiovascular;  Laterality: N/A;  . MASTECTOMY PARTIAL / LUMPECTOMY    . OOPHORECTOMY    . ROTATOR CUFF REPAIR       OB History   No obstetric history on file.     Family History  Problem Relation Age of Onset  . Lung cancer Mother   . COPD Father   . Lung cancer Father   . Lymphoma Father     Social History   Tobacco Use  . Smoking status: Former Smoker    Types: Cigarettes  . Smokeless tobacco: Never Used  Substance Use Topics  . Alcohol use: No  . Drug use: No    Home Medications Prior  to Admission medications   Medication Sig Start Date End Date Taking? Authorizing Provider  albuterol (PROAIR HFA) 108 (90 Base) MCG/ACT inhaler Inhale 2 puffs into the lungs every 6 (six) hours as needed for wheezing or shortness of breath. 06/09/19   Barton Dubois, MD  aspirin 81 MG tablet Take 1 tablet (81 mg total) by mouth daily. 06/09/19   Barton Dubois, MD  Coenzyme Q10 10 MG capsule Take 40 mg by mouth daily. 4 capsules po qd    [provider]  diclofenac sodium (VOLTAREN) 1 % GEL Apply 2 g topically 4 (four) times daily as needed (for pain). 08/21/17   Hudnall, Sharyn Lull, MD  doxepin (SINEQUAN) 10 MG capsule Take 10 mg by mouth at bedtime. 10/11/13   [provider]  DULoxetine (CYMBALTA) 60 MG capsule Take 60 mg by mouth 2 (two) times daily. 10/11/13   [provider]  estradiol (ESTRACE) 1 MG tablet Take 1 mg by mouth daily. 10/05/13   [provider]  fluticasone (FLONASE) 50 MCG/ACT nasal spray Place 1 spray into both nostrils daily as needed for allergies.  10/11/13   [provider]  furosemide (LASIX) 20 MG tablet Take 1 tablet (20 mg total) by mouth daily. 06/09/19 06/08/20  Barton Dubois, MD  gemfibrozil (LOPID) 600 MG tablet Take 600 mg by mouth 2 (two) times daily before a meal.      [provider]  isosorbide mononitrate (IMDUR) 30 MG 24 hr tablet Take 30 mg by mouth daily. 10/06/13   [provider]  l-methylfolate-B6-B12 (METANX) 3-35-2 MG TABS Take 1 tablet by mouth daily.    [provider]  loratadine (CLARITIN) 10 MG tablet Take 10 mg by mouth 2 (two) times daily.      [provider]  LORazepam (ATIVAN) 0.5 MG tablet Take 0.5 mg by mouth 3 (three) times daily.     [provider]  magnesium hydroxide (MILK OF MAGNESIA) 400 MG/5ML suspension Take 15 mLs by mouth at bedtime.    [provider]  Melatonin (MELATONIN TR) 10 MG TBCR Take 10 mg by mouth at bedtime.    [provider]  methocarbamol (ROBAXIN) 500 MG tablet Take 500 mg by mouth 3 (three) times daily. 08/12/13   [provider]  montelukast (SINGULAIR) 10 MG tablet Take 10 mg by mouth daily. 10/11/13   [provider]  Multiple Vitamin (MULTIVITAMIN) capsule Take 1 capsule by mouth daily.      [provider]  NITROSTAT 0.4 MG SL tablet Place 0.4 mg under the tongue every 15 (fifteen) minutes as needed for chest pain.  09/22/13   [provider]  omeprazole (PRILOSEC) 40 MG capsule Take 40 mg by mouth 2 (two) times daily.      [provider]  oxyCODONE (OXY IR/ROXICODONE) 5 MG immediate release tablet  05/29/19   [provider]  polyethylene glycol  (MIRALAX) 17 g packet Take 17 g by mouth daily. 06/09/19   Barton Dubois, MD  potassium chloride 20 MEQ TBCR Take 20 mEq by mouth daily. 06/09/19 07/09/19  Barton Dubois, MD  valACYclovir (VALTREX) 500 MG tablet Take 500 mg by mouth 2 (two) times daily.      [provider]  Vitamin D, Ergocalciferol, (DRISDOL) 50000 UNITS CAPS capsule Take 1 Units by mouth once a week. 10/18/13   [provider]  XTAMPZA ER 18 MG C12A Take 1 capsule by mouth 2 (two) times daily. 05/29/19   [provider]    Allergies    Sulfa antibiotics, Dilaudid [hydromorphone hcl], Metronidazole, Prednisone, Cephalexin, Methotrexate derivatives, and Morphine and related  Review of Systems   Review of Systems  All other systems reviewed and are negative.   Physical Exam Updated Vital Signs There were no vitals taken for this visit.  Physical Exam Vitals and nursing note reviewed.   64 year old female, resting comfortably and in no acute distress. Vital signs are significant for elevated systolic blood pressure. Oxygen saturation is 95%, which is normal. Head is normocephalic and atraumatic. PERRLA, EOMI. Oropharynx is clear. Neck has mild tenderness in the mid cervical region without adenopathy or JVD. Back is moderately tender in the mid thoracic and mid and upper lumbar areas.  There is no CVA tenderness. Lungs are clear without rales, wheezes, or rhonchi. Chest is nontender. Heart has regular rate and rhythm without murmur. Abdomen is soft, flat, nontender without masses or hepatosplenomegaly and peristalsis is normoactive. Pelvis is stable, but there is significant tenderness at the left posterior iliac crest. Extremities: There is no swelling or deformity anywhere.  Minor abrasions are noted on the radial aspect of the right forearm.  There is tenderness to palpation over both shoulders, both elbows, right midfoot.  There is full passive range of motion of all joints, but pain is  elicited with passive range of motion of the right shoulder. Skin is warm and dry without rash. Neurologic: Mental status is normal, cranial nerves are intact, there are no motor or sensory deficits.  ED Results / Procedures / Treatments    Radiology No results found.  Procedures Procedures   Medications Ordered in ED Medications - No data to display  ED Course  I have reviewed the triage vital signs and the nursing notes.  Pertinent imaging results that were available during my care of the patient were reviewed by me and considered in my medical decision making (see chart for details).  MDM Rules/Calculators/A&P Fall with complaints of pain in both shoulders, both elbows, right foot, back.  Additional areas of tenderness elicited left posterior iliac crest and mid cervical area.  She will be sent for CT of head and cervical spine and plain films obtained of all other areas where she is complaining of pain.  I have very low index of suspicion for serious injury.  She is complaining of pain and states that she left her long-acting oxycodone tablet at home.  I have reviewed the record on the [place] controlled substance reporting website confirming that she gets prescriptions for both long-acting and short acting oxycodone.  She is given a dose of oxycodone.  I have reviewed all of the x-rays and do not see any evidence of significant injury, but radiologist interpretation is pending.  Case is signed out to Dr. Reather Converse.  Final Clinical Impression(s) / ED Diagnoses Final diagnoses:  Fall at home, initial encounter  Contusion of right foot, initial encounter  Abrasion of right forearm, initial encounter  Contusion, multiple sites    Rx / DC Orders ED Discharge Orders    None       Delora Fuel, MD 16/07/37 [phone]

## 2019-12-03 ENCOUNTER — Institutional Professional Consult (permissible substitution): Payer: Medicare Other | Admitting: Pulmonary Disease

## 2020-01-01 ENCOUNTER — Institutional Professional Consult (permissible substitution): Payer: Medicare Other | Admitting: Pulmonary Disease

## 2020-01-02 ENCOUNTER — Emergency Department (HOSPITAL_COMMUNITY): Payer: Medicare Other

## 2020-01-02 ENCOUNTER — Observation Stay (HOSPITAL_COMMUNITY)
Admission: EM | Admit: 2020-01-02 | Discharge: 2020-01-03 | Disposition: A | Discharge status: Home / Self Care | Payer: Medicare Other | Attending: Emergency Medicine | Admitting: Emergency Medicine

## 2020-01-02 ENCOUNTER — Encounter (HOSPITAL_COMMUNITY): Payer: Self-pay | Admitting: Emergency Medicine

## 2020-01-02 ENCOUNTER — Other Ambulatory Visit: Payer: Self-pay

## 2020-01-02 DIAGNOSIS — J449 Chronic obstructive pulmonary disease, unspecified: Secondary | ICD-10-CM | POA: Diagnosis not present

## 2020-01-02 DIAGNOSIS — D649 Anemia, unspecified: Principal | ICD-10-CM | POA: Insufficient documentation

## 2020-01-02 DIAGNOSIS — I10 Essential (primary) hypertension: Secondary | ICD-10-CM | POA: Insufficient documentation

## 2020-01-02 DIAGNOSIS — Z87891 Personal history of nicotine dependence: Secondary | ICD-10-CM | POA: Insufficient documentation

## 2020-01-02 DIAGNOSIS — J45909 Unspecified asthma, uncomplicated: Secondary | ICD-10-CM | POA: Diagnosis not present

## 2020-01-02 DIAGNOSIS — R0602 Shortness of breath: Secondary | ICD-10-CM | POA: Diagnosis not present

## 2020-01-02 DIAGNOSIS — Z7982 Long term (current) use of aspirin: Secondary | ICD-10-CM | POA: Insufficient documentation

## 2020-01-02 DIAGNOSIS — Z20822 Contact with and (suspected) exposure to covid-19: Secondary | ICD-10-CM | POA: Insufficient documentation

## 2020-01-02 DIAGNOSIS — I2510 Atherosclerotic heart disease of native coronary artery without angina pectoris: Secondary | ICD-10-CM | POA: Insufficient documentation

## 2020-01-02 DIAGNOSIS — M7989 Other specified soft tissue disorders: Secondary | ICD-10-CM | POA: Diagnosis present

## 2020-01-02 LAB — COMPREHENSIVE METABOLIC PANEL
ALT: 21 U/L (ref 0–44)
AST: 30 U/L (ref 15–41)
Albumin: 4.2 g/dL (ref 3.5–5.0)
Alkaline Phosphatase: 68 U/L (ref 38–126)
Anion gap: 11 (ref 5–15)
BUN: 9 mg/dL (ref 8–23)
CO2: 27 mmol/L (ref 22–32)
Calcium: 8.8 mg/dL — ABNORMAL LOW (ref 8.9–10.3)
Chloride: 94 mmol/L — ABNORMAL LOW (ref 98–111)
Creatinine, Ser: 1.26 mg/dL — ABNORMAL HIGH (ref 0.44–1.00)
GFR calc Af Amer: 51 mL/min — ABNORMAL LOW (ref >60)
GFR calc non Af Amer: 44 mL/min — ABNORMAL LOW (ref >60)
Glucose, Bld: 139 mg/dL — ABNORMAL HIGH (ref 70–99)
Potassium: 3.9 mmol/L (ref 3.5–5.1)
Sodium: 132 mmol/L — ABNORMAL LOW (ref 135–145)
Total Bilirubin: 0.4 mg/dL (ref 0.3–1.2)
Total Protein: 7.4 g/dL (ref 6.5–8.1)

## 2020-01-02 LAB — PROTIME-INR
INR: 1.1 (ref 0.8–1.2)
Prothrombin Time: 13.9 seconds (ref 11.4–15.2)

## 2020-01-02 LAB — CBC
HCT: 24 % — ABNORMAL LOW (ref 36.0–46.0)
Hemoglobin: 6.2 g/dL — CL (ref 12.0–15.0)
MCH: 20.6 pg — ABNORMAL LOW (ref 26.0–34.0)
MCHC: 25.8 g/dL — ABNORMAL LOW (ref 30.0–36.0)
MCV: 79.7 fL — ABNORMAL LOW (ref 80.0–100.0)
Platelets: 495 10*3/uL — ABNORMAL HIGH (ref 150–400)
RBC: 3.01 MIL/uL — ABNORMAL LOW (ref 3.87–5.11)
RDW: 18.3 % — ABNORMAL HIGH (ref 11.5–15.5)
WBC: 9.7 10*3/uL (ref 4.0–10.5)
nRBC: 0 % (ref 0.0–0.2)

## 2020-01-02 LAB — BRAIN NATRIURETIC PEPTIDE: B Natriuretic Peptide: 37 pg/mL (ref 0.0–100.0)

## 2020-01-02 LAB — POC OCCULT BLOOD, ED: Fecal Occult Bld: NEGATIVE

## 2020-01-02 MED ORDER — SODIUM CHLORIDE 0.9 % IV SOLN: 10.0000 mL/h | Freq: Once | INTRAVENOUS | Status: DC

## 2020-01-02 MED ORDER — SODIUM CHLORIDE 0.9% IV SOLUTION: Freq: Once | INTRAVENOUS | Status: AC

## 2020-01-02 NOTE — ED Provider Notes (Signed)
[facility] EMERGENCY DEPARTMENT Provider Note   CSN: [id number] Arrival date & time: 01/02/20  2058     History Chief Complaint  Patient presents with  . Leg Swelling    Kayla Ryan is a 64 y.o. female.  Pt complains of feeling short of breath and weak.  Pt reports she has a history of anemia.  Pt complains of feet being swollen.  Pt states she feels short of breath today.  Pt denies fever or chills no chest pain  No nausea or vomiting         Past Medical History:  Diagnosis Date  . Arthritis   . Chest pain   . Chronic bronchitis   . COPD (chronic obstructive pulmonary disease) ([place])   . Crohn disease ([place])   . Emphysema   . Fibromyalgia   . Herpes   . Hyperlipidemia   . IBS (irritable bowel syndrome)   . Migraines   . Muscular dystrophy ([place])   . Neck pain   . Plantar fasciitis   . Scoliosis     Patient Active Problem List   Diagnosis Date Noted  . Syncope 06/08/2019  . Hypokalemia 06/08/2019  . Trigger finger, acquired 11/03/2017  . Chronic obstructive pulmonary disease ([place]) 08/23/2017  . Chronic pain 08/23/2017  . History of Crohn's disease 08/23/2017  . Myopathy 08/23/2017  . Right shoulder injury, subsequent encounter 08/23/2017  . Primary insomnia 02/01/2017  . Esophageal dysphagia 10/06/2016  . Essential hypertension 07/17/2016  . Iron deficiency anemia 03/25/2016  . History of Bell's palsy 03/03/2015  . Nuclear sclerosis of both eyes 03/03/2015  . Allergic rhinitis 05/18/2014  . Anxiety 05/18/2014  . Arthritis 05/18/2014  . Asthma 05/18/2014  . Chronic constipation 05/18/2014  . Dermatomyositis ([place]) 05/18/2014  . Migraine without status migrainosus, not intractable 05/18/2014  . Rectocele 05/18/2014  . 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 Peripheral neuropathy 05/18/2014  . Chest pain 10/30/2013  . Hyperlipidemia 10/30/2013  . Carotid artery disease ([place]) 10/30/2013    Past Surgical History:  Procedure  Laterality Date  . ABDOMINAL HYSTERECTOMY    . bone spur    . CHOLECYSTECTOMY    . HERNIA REPAIR    . LEFT HEART CATHETERIZATION WITH CORONARY ANGIOGRAM N/A 11/07/2013   Procedure: LEFT HEART CATHETERIZATION WITH CORONARY ANGIOGRAM;  Surgeon: Lorretta Harp, MD;  Location: [facility] CATH LAB;  Service: Cardiovascular;  Laterality: N/A;  . MASTECTOMY PARTIAL / LUMPECTOMY    . OOPHORECTOMY    . ROTATOR CUFF REPAIR       OB History   No obstetric history on file.     Family History  Problem Relation Age of Onset  . Lung cancer Mother   . COPD Father   . Lung cancer Father   . Lymphoma Father     Social History   Tobacco Use  . Smoking status: Former Smoker    Types: Cigarettes  . Smokeless tobacco: Never Used  Substance Use Topics  . Alcohol use: No  . Drug use: No    Home Medications Prior to Admission medications   Medication Sig Start Date End Date Taking? Authorizing Provider  albuterol (PROAIR HFA) 108 (90 Base) MCG/ACT inhaler Inhale 2 puffs into the lungs every 6 (six) hours as needed for wheezing or shortness of breath. 06/09/19   Barton Dubois, MD  aspirin 81 MG tablet Take 1 tablet (81 mg total) by mouth daily. 06/09/19   Barton Dubois, MD  Coenzyme Q10 10 MG  capsule Take 40 mg by mouth daily. 4 capsules po qd    [provider]  diclofenac sodium (VOLTAREN) 1 % GEL Apply 2 g topically 4 (four) times daily as needed (for pain). 08/21/17   Hudnall, Sharyn Lull, MD  doxepin (SINEQUAN) 10 MG capsule Take 10 mg by mouth at bedtime. 10/11/13   [provider]  DULoxetine (CYMBALTA) 60 MG capsule Take 60 mg by mouth 2 (two) times daily. 10/11/13   [provider]  estradiol (ESTRACE) 1 MG tablet Take 1 mg by mouth daily. 10/05/13   [provider]  fluticasone (FLONASE) 50 MCG/ACT nasal spray Place 1 spray into both nostrils daily as needed for allergies.  10/11/13   [provider]  furosemide (LASIX) 20 MG tablet Take 1 tablet (20 mg total)  by mouth daily. 06/09/19 06/08/20  Barton Dubois, MD  gemfibrozil (LOPID) 600 MG tablet Take 600 mg by mouth 2 (two) times daily before a meal.      [provider]  isosorbide mononitrate (IMDUR) 30 MG 24 hr tablet Take 30 mg by mouth daily. 10/06/13   [provider]  l-methylfolate-B6-B12 (METANX) 3-35-2 MG TABS Take 1 tablet by mouth daily.    [provider]  loratadine (CLARITIN) 10 MG tablet Take 10 mg by mouth 2 (two) times daily.      [provider]  LORazepam (ATIVAN) 0.5 MG tablet Take 0.5 mg by mouth 3 (three) times daily.     [provider]  magnesium hydroxide (MILK OF MAGNESIA) 400 MG/5ML suspension Take 15 mLs by mouth at bedtime.    [provider]  Melatonin (MELATONIN TR) 10 MG TBCR Take 10 mg by mouth at bedtime.    [provider]  methocarbamol (ROBAXIN) 500 MG tablet Take 500 mg by mouth 3 (three) times daily. 08/12/13   [provider]  montelukast (SINGULAIR) 10 MG tablet Take 10 mg by mouth daily. 10/11/13   [provider]  Multiple Vitamin (MULTIVITAMIN) capsule Take 1 capsule by mouth daily.      [provider]  NITROSTAT 0.4 MG SL tablet Place 0.4 mg under the tongue every 15 (fifteen) minutes as needed for chest pain.  09/22/13   [provider]  omeprazole (PRILOSEC) 40 MG capsule Take 40 mg by mouth 2 (two) times daily.      [provider]  oxyCODONE (OXY IR/ROXICODONE) 5 MG immediate release tablet  05/29/19   [provider]  polyethylene glycol (MIRALAX) 17 g packet Take 17 g by mouth daily. 06/09/19   Barton Dubois, MD  potassium chloride 20 MEQ TBCR Take 20 mEq by mouth daily. 06/09/19 07/09/19  Barton Dubois, MD  valACYclovir (VALTREX) 500 MG tablet Take 500 mg by mouth 2 (two) times daily.      [provider]  Vitamin D, Ergocalciferol, (DRISDOL) 50000 UNITS CAPS capsule Take 1 Units by mouth once a week. 10/18/13   [provider]    XTAMPZA ER 18 MG C12A Take 1 capsule by mouth 2 (two) times daily. 05/29/19   [provider]    Allergies    Sulfa antibiotics, Dilaudid [hydromorphone hcl], Metronidazole, Prednisone, Cephalexin, Methotrexate derivatives, and Morphine and related  Review of Systems   Review of Systems  Constitutional: Positive for fatigue.  Respiratory: Positive for shortness of breath.   All other systems reviewed and are negative.   Physical Exam Updated Vital Signs BP (!) 128/56   Pulse 84   Temp 99  F (37.2 C) (Oral)   Resp 11   Ht 5' 8"  (1.727 m)   Wt 81.6 kg   SpO2 94%   BMI 27.37 kg/m   Physical Exam Vitals and nursing note reviewed.  Constitutional:      Appearance: She is well-developed. She is ill-appearing.     Comments: pale  HENT:     Head: Normocephalic.     Mouth/Throat:     Mouth: Mucous membranes are moist.  Eyes:     Pupils: Pupils are equal, round, and reactive to light.  Cardiovascular:     Rate and Rhythm: Normal rate.  Pulmonary:     Effort: Pulmonary effort is normal.  Abdominal:     General: Abdomen is flat. There is no distension.     Palpations: Abdomen is soft.  Genitourinary:    Rectum: Normal.  Musculoskeletal:        General: Normal range of motion.     Cervical back: Normal range of motion.  Skin:    Coloration: Skin is pale.  Neurological:     Mental Status: She is alert and oriented to person, place, and time.  Psychiatric:        Mood and Affect: Mood normal.     ED Results / Procedures / Treatments   Labs (all labs ordered are listed, but only abnormal results are displayed) Labs Reviewed  CBC - Abnormal; Notable for the following components:      Result Value   RBC 3.01 (*)    Hemoglobin 6.2 (*)    HCT 24.0 (*)    MCV 79.7 (*)    MCH 20.6 (*)    MCHC 25.8 (*)    RDW 18.3 (*)    Platelets 495 (*)    All other components within normal limits  COMPREHENSIVE METABOLIC PANEL - Abnormal; Notable for the following  components:   Sodium 132 (*)    Chloride 94 (*)    Glucose, Bld 139 (*)    Creatinine, Ser 1.26 (*)    Calcium 8.8 (*)    GFR calc non Af Amer 44 (*)    GFR calc Af Amer 51 (*)    All other components within normal limits  SARS CORONAVIRUS 2 BY RT PCR (HOSPITAL ORDER, [facility])  BRAIN NATRIURETIC PEPTIDE  TYPE AND SCREEN  PREPARE RBC (CROSSMATCH)    EKG None  Radiology No results found.  Procedures .Critical Care Performed by: Fransico Meadow, PA-C Authorized by: Fransico Meadow, PA-C   Critical care provider statement:    Critical care time (minutes):  45   Critical care start time:  01/02/2020 10:45 AM   Critical care end time:  01/02/2020 11:58 PM   Critical care was time spent personally by me on the following activities:  Discussions with consultants, evaluation of patient's response to treatment, examination of patient, ordering and performing treatments and interventions, ordering and review of laboratory studies, ordering and review of radiographic studies, pulse oximetry, re-evaluation of patient's condition, obtaining history from patient or surrogate and review of old charts   I assumed direction of critical care for this patient from another provider in my specialty: no     (including critical care time)  Medications Ordered in ED Medications  0.9 %  sodium chloride infusion (Manually program via Guardrails IV Fluids) (has no administration in time range)  0.9 %  sodium chloride infusion (has no administration in time range)    ED Course  I have reviewed the triage vital signs and the nursing notes.  Pertinent labs & imaging results that were available during my care of the patient were reviewed by me and considered in my medical decision making (see chart for details).    MDM Rules/Calculators/A&P                      MDM:  Pt's hemoglobin is 6.2.  Pt is agreeable to blood transfusion.  Hemocult is negative.   Final Clinical  Impression(s) / ED Diagnoses Final diagnoses:  Anemia, unspecified type    Rx / DC Orders ED Discharge Orders    None       Sidney Ace 01/02/20 2358    Ezequiel Essex, MD 01/03/20 [id number]

## 2020-01-02 NOTE — ED Triage Notes (Signed)
Pt C/O bilateral leg swelling and pain that started this morning.

## 2020-01-02 NOTE — ED Notes (Signed)
Date and time results received: 01/02/20 10:46 PM  (use smartphrase ".now" to insert current time)  Test: hg Critical Value: 6.2  Name of Provider Notified: [email or web address]  Orders Received? Or Actions Taken?: none at this time

## 2020-01-03 ENCOUNTER — Encounter (HOSPITAL_COMMUNITY): Payer: Self-pay | Admitting: Family Medicine

## 2020-01-03 DIAGNOSIS — D649 Anemia, unspecified: Secondary | ICD-10-CM | POA: Diagnosis not present

## 2020-01-03 LAB — PREPARE RBC (CROSSMATCH)

## 2020-01-03 LAB — COMPREHENSIVE METABOLIC PANEL
ALT: 20 U/L (ref 0–44)
AST: 27 U/L (ref 15–41)
Albumin: 3.8 g/dL (ref 3.5–5.0)
Alkaline Phosphatase: 59 U/L (ref 38–126)
Anion gap: 10 (ref 5–15)
BUN: 10 mg/dL (ref 8–23)
CO2: 28 mmol/L (ref 22–32)
Calcium: 8.8 mg/dL — ABNORMAL LOW (ref 8.9–10.3)
Chloride: 96 mmol/L — ABNORMAL LOW (ref 98–111)
Creatinine, Ser: 1.14 mg/dL — ABNORMAL HIGH (ref 0.44–1.00)
GFR calc Af Amer: 58 mL/min — ABNORMAL LOW (ref >60)
GFR calc non Af Amer: 50 mL/min — ABNORMAL LOW (ref >60)
Glucose, Bld: 140 mg/dL — ABNORMAL HIGH (ref 70–99)
Potassium: 4.3 mmol/L (ref 3.5–5.1)
Sodium: 134 mmol/L — ABNORMAL LOW (ref 135–145)
Total Bilirubin: 0.8 mg/dL (ref 0.3–1.2)
Total Protein: 6.9 g/dL (ref 6.5–8.1)

## 2020-01-03 LAB — CBC
HCT: 25.9 % — ABNORMAL LOW (ref 36.0–46.0)
Hemoglobin: 7 g/dL — ABNORMAL LOW (ref 12.0–15.0)
MCH: 21.8 pg — ABNORMAL LOW (ref 26.0–34.0)
MCHC: 27 g/dL — ABNORMAL LOW (ref 30.0–36.0)
MCV: 80.7 fL (ref 80.0–100.0)
Platelets: 440 10*3/uL — ABNORMAL HIGH (ref 150–400)
RBC: 3.21 MIL/uL — ABNORMAL LOW (ref 3.87–5.11)
RDW: 17.9 % — ABNORMAL HIGH (ref 11.5–15.5)
WBC: 8 10*3/uL (ref 4.0–10.5)
nRBC: 0 % (ref 0.0–0.2)

## 2020-01-03 LAB — SARS CORONAVIRUS 2 BY RT PCR (HOSPITAL ORDER, PERFORMED IN ~~LOC~~ HOSPITAL LAB): SARS Coronavirus 2: NEGATIVE

## 2020-01-03 LAB — HEMOGLOBIN AND HEMATOCRIT, BLOOD
HCT: 30.4 % — ABNORMAL LOW (ref 36.0–46.0)
Hemoglobin: 8.5 g/dL — ABNORMAL LOW (ref 12.0–15.0)

## 2020-01-03 LAB — TROPONIN I (HIGH SENSITIVITY)
Troponin I (High Sensitivity): 9 ng/L (ref <18)
Troponin I (High Sensitivity): 8 ng/L (ref <18)

## 2020-01-03 MED ORDER — LORATADINE 10 MG PO TABS
10.0000 mg | ORAL_TABLET | Freq: Two times a day (BID) | ORAL | Status: DC
  Administered 2020-01-03: 10 mg via ORAL
  Filled 2020-01-03: qty 1

## 2020-01-03 MED ORDER — OXYCODONE HCL ER 20 MG PO T12A
20.0000 mg | EXTENDED_RELEASE_TABLET | Freq: Two times a day (BID) | ORAL | Status: DC
  Administered 2020-01-03: 20 mg via ORAL
  Filled 2020-01-03: qty 1

## 2020-01-03 MED ORDER — DULOXETINE HCL 30 MG PO CPEP
60.0000 mg | ORAL_CAPSULE | Freq: Two times a day (BID) | ORAL | Status: DC
  Administered 2020-01-03: 60 mg via ORAL
  Filled 2020-01-03: qty 2

## 2020-01-03 MED ORDER — ASPIRIN EC 81 MG PO TBEC
81.0000 mg | DELAYED_RELEASE_TABLET | Freq: Every day | ORAL | Status: DC
  Administered 2020-01-03: 81 mg via ORAL
  Filled 2020-01-03: qty 1

## 2020-01-03 MED ORDER — PANTOPRAZOLE SODIUM 40 MG PO TBEC
40.0000 mg | DELAYED_RELEASE_TABLET | Freq: Every day | ORAL | Status: DC
  Administered 2020-01-03: 40 mg via ORAL
  Filled 2020-01-03: qty 1

## 2020-01-03 MED ORDER — ACETAMINOPHEN 650 MG RE SUPP: 650.0000 mg | Freq: Four times a day (QID) | RECTAL | Status: DC | PRN

## 2020-01-03 MED ORDER — ISOSORBIDE MONONITRATE ER 30 MG PO TB24
30.0000 mg | ORAL_TABLET | Freq: Every day | ORAL | Status: DC
  Administered 2020-01-03: 30 mg via ORAL
  Filled 2020-01-03: qty 1

## 2020-01-03 MED ORDER — GEMFIBROZIL 600 MG PO TABS
600.0000 mg | ORAL_TABLET | Freq: Two times a day (BID) | ORAL | Status: DC
  Administered 2020-01-03: 600 mg via ORAL
  Filled 2020-01-03: qty 1

## 2020-01-03 MED ORDER — ACETAMINOPHEN 325 MG PO TABS: 650.0000 mg | ORAL_TABLET | Freq: Four times a day (QID) | ORAL | Status: DC | PRN

## 2020-01-03 MED ORDER — DOXEPIN HCL 10 MG PO CAPS
10.0000 mg | ORAL_CAPSULE | Freq: Every day | ORAL | Status: DC
  Filled 2020-01-03 (×3): qty 1

## 2020-01-03 MED ORDER — OMEPRAZOLE 40 MG PO CPDR: 40.0000 mg | DELAYED_RELEASE_CAPSULE | Freq: Every day | ORAL | 1 refills | Status: DC

## 2020-01-03 MED ORDER — MONTELUKAST SODIUM 10 MG PO TABS
10.0000 mg | ORAL_TABLET | Freq: Every day | ORAL | Status: DC
  Administered 2020-01-03: 10 mg via ORAL
  Filled 2020-01-03: qty 1

## 2020-01-03 MED ORDER — FLUTICASONE PROPIONATE 50 MCG/ACT NA SUSP: 1.0000 | Freq: Every day | NASAL | Status: DC | PRN

## 2020-01-03 MED ORDER — ONDANSETRON HCL 4 MG/2ML IJ SOLN: 4.0000 mg | Freq: Four times a day (QID) | INTRAMUSCULAR | Status: DC | PRN

## 2020-01-03 MED ORDER — ALBUTEROL SULFATE (2.5 MG/3ML) 0.083% IN NEBU: 3.0000 mL | INHALATION_SOLUTION | Freq: Four times a day (QID) | RESPIRATORY_TRACT | Status: DC | PRN

## 2020-01-03 MED ORDER — FUROSEMIDE 40 MG PO TABS
20.0000 mg | ORAL_TABLET | Freq: Every day | ORAL | Status: DC
  Administered 2020-01-03: 20 mg via ORAL
  Filled 2020-01-03: qty 1

## 2020-01-03 MED ORDER — ONDANSETRON HCL 4 MG PO TABS: 4.0000 mg | ORAL_TABLET | Freq: Four times a day (QID) | ORAL | Status: DC | PRN

## 2020-01-03 MED ORDER — LORAZEPAM 0.5 MG PO TABS
0.5000 mg | ORAL_TABLET | Freq: Three times a day (TID) | ORAL | Status: DC | PRN
  Administered 2020-01-03: 0.5 mg via ORAL
  Filled 2020-01-03: qty 1

## 2020-01-03 MED ORDER — POLYETHYLENE GLYCOL 3350 17 G PO PACK: 17.0000 g | PACK | Freq: Every day | ORAL | Status: DC

## 2020-01-03 MED ORDER — VALACYCLOVIR HCL 500 MG PO TABS
500.0000 mg | ORAL_TABLET | Freq: Two times a day (BID) | ORAL | Status: DC
  Administered 2020-01-03: 500 mg via ORAL
  Filled 2020-01-03: qty 1

## 2020-01-03 NOTE — Plan of Care (Signed)
Patient's hemoglobin and hematocrit are 8.5 and 30.4, respectively. Dr. Roger Shelter aware. Notified Probation officer ok to discharge.

## 2020-01-03 NOTE — H&P (Signed)
TRH H&P    Patient Demographics:    Kayla Ryan, is a 64 y.o. female  MRN: [id number]  DOB - [birth year]  Admit Date - 01/02/2020  Referring MD/NP/PA: Carleene Overlie  Outpatient Primary MD for the patient is [name], [place]  Patient coming from: Home  Chief complaint-shortness of breath   HPI:    Kayla Ryan  is a 64 y.o. female, with history of hyperlipidemia, fibromyalgia, muscular dystrophy, Crohn's disease, IBS, COPD, chronic anemia presented to the ED with complaints of increasing leg swelling and shortness of breath.  Patient has history of chronic anemia and has been evaluated by hematology at [facility].  Patient has a history of Crohn disease and has been followed by GI as outpatient.  She denies vomiting any blood.  Denies noting any blood in the stool.  In the ED her hemoglobin was found to be low at 6.2, her baseline hemoglobin is around 7.3. Denies black-colored stool. In the ED Hemoccult was negative 2 units PRBC were ordered in the ED. She denies chest pain Denies abdominal pain or dysuria No history of stroke or seizures Denies fever or cough.    Review of systems:    In addition to the HPI above,    All other systems reviewed and are negative.    Past History of the following :    Past Medical History:  Diagnosis Date  . Arthritis   . Chest pain   . Chronic bronchitis   . COPD (chronic obstructive pulmonary disease) ([place])   . Crohn disease ([place])   . Emphysema   . Fibromyalgia   . Herpes   . Hyperlipidemia   . IBS (irritable bowel syndrome)   . Migraines   . Muscular dystrophy ([place])   . Neck pain   . Plantar fasciitis   . Scoliosis       Past Surgical History:  Procedure Laterality Date  . ABDOMINAL HYSTERECTOMY    . bone spur    . CHOLECYSTECTOMY    . HERNIA REPAIR    . LEFT HEART CATHETERIZATION WITH CORONARY ANGIOGRAM N/A 11/07/2013   Procedure: LEFT  HEART CATHETERIZATION WITH CORONARY ANGIOGRAM;  Surgeon: Lorretta Harp, MD;  Location: [facility] CATH LAB;  Service: Cardiovascular;  Laterality: N/A;  . MASTECTOMY PARTIAL / LUMPECTOMY    . OOPHORECTOMY    . ROTATOR CUFF REPAIR        Social History:      Social History   Tobacco Use  . Smoking status: Former Smoker    Types: Cigarettes  . Smokeless tobacco: Never Used  Substance Use Topics  . Alcohol use: No       Family History :     Family History  Problem Relation Age of Onset  . Lung cancer Mother   . COPD Father   . Lung cancer Father   . Lymphoma Father       Home Medications:   Prior to Admission medications   Medication Sig Start Date End Date Taking? Authorizing Provider  albuterol (PROAIR HFA) 108 (  90 Base) MCG/ACT inhaler Inhale 2 puffs into the lungs every 6 (six) hours as needed for wheezing or shortness of breath. 06/09/19   Barton Dubois, MD  aspirin 81 MG tablet Take 1 tablet (81 mg total) by mouth daily. 06/09/19   Barton Dubois, MD  Coenzyme Q10 10 MG capsule Take 40 mg by mouth daily. 4 capsules po qd    [provider]  diclofenac sodium (VOLTAREN) 1 % GEL Apply 2 g topically 4 (four) times daily as needed (for pain). 08/21/17   Hudnall, Sharyn Lull, MD  doxepin (SINEQUAN) 10 MG capsule Take 10 mg by mouth at bedtime. 10/11/13   [provider]  DULoxetine (CYMBALTA) 60 MG capsule Take 60 mg by mouth 2 (two) times daily. 10/11/13   [provider]  estradiol (ESTRACE) 1 MG tablet Take 1 mg by mouth daily. 10/05/13   [provider]  fluticasone (FLONASE) 50 MCG/ACT nasal spray Place 1 spray into both nostrils daily as needed for allergies.  10/11/13   [provider]  furosemide (LASIX) 20 MG tablet Take 1 tablet (20 mg total) by mouth daily. 06/09/19 06/08/20  Barton Dubois, MD  gemfibrozil (LOPID) 600 MG tablet Take 600 mg by mouth 2 (two) times daily before a meal.      [provider]  isosorbide  mononitrate (IMDUR) 30 MG 24 hr tablet Take 30 mg by mouth daily. 10/06/13   [provider]  l-methylfolate-B6-B12 (METANX) 3-35-2 MG TABS Take 1 tablet by mouth daily.    [provider]  loratadine (CLARITIN) 10 MG tablet Take 10 mg by mouth 2 (two) times daily.      [provider]  LORazepam (ATIVAN) 0.5 MG tablet Take 0.5 mg by mouth 3 (three) times daily.     [provider]  magnesium hydroxide (MILK OF MAGNESIA) 400 MG/5ML suspension Take 15 mLs by mouth at bedtime.    [provider]  Melatonin (MELATONIN TR) 10 MG TBCR Take 10 mg by mouth at bedtime.    [provider]  methocarbamol (ROBAXIN) 500 MG tablet Take 500 mg by mouth 3 (three) times daily. 08/12/13   [provider]  montelukast (SINGULAIR) 10 MG tablet Take 10 mg by mouth daily. 10/11/13   [provider]  Multiple Vitamin (MULTIVITAMIN) capsule Take 1 capsule by mouth daily.      [provider]  NITROSTAT 0.4 MG SL tablet Place 0.4 mg under the tongue every 15 (fifteen) minutes as needed for chest pain.  09/22/13   [provider]  omeprazole (PRILOSEC) 40 MG capsule Take 40 mg by mouth 2 (two) times daily.      [provider]  oxyCODONE (OXY IR/ROXICODONE) 5 MG immediate release tablet  05/29/19   [provider]  polyethylene glycol (MIRALAX) 17 g packet Take 17 g by mouth daily. 06/09/19   Barton Dubois, MD  potassium chloride 20 MEQ TBCR Take 20 mEq by mouth daily. 06/09/19 07/09/19  Barton Dubois, MD  valACYclovir (VALTREX) 500 MG tablet Take 500 mg by mouth 2 (two) times daily.      [provider]  Vitamin D, Ergocalciferol, (DRISDOL) 50000 UNITS CAPS capsule Take 1 Units by mouth once a week. 10/18/13   [provider]  XTAMPZA ER 18 MG C12A Take 1 capsule by mouth 2 (two) times daily. 05/29/19   [provider]     Allergies:     Allergies  Allergen Reactions  . Sulfa Antibiotics  Shortness Of Breath, Swelling and Rash  . Dilaudid [Hydromorphone Hcl]     Makes me crazy  . Metronidazole Diarrhea and Nausea And Vomiting  . Prednisone Other (See Comments)    angry angry   . Cephalexin Diarrhea and Nausea And Vomiting  . Methotrexate Derivatives Swelling and Rash  . Morphine And Related Anxiety    "exreme mood swings"     Physical Exam:   Vitals  Blood pressure 122/65, pulse 86, temperature 98.2 F (36.8 C), resp. rate 14, height 5' 8"  (1.727 m), weight 81.6 kg, SpO2 99 %.  1.  General: Appears in no acute distress  2. Psychiatric: Alert, oriented x3, intact insight and judgment  3. Neurologic: Cranial nerves II through XII grossly intact  4. HEENMT:  Atraumatic normocephalic, extraocular muscles are intact  5. Respiratory : Bibasilar crackles auscultated  6. Cardiovascular : S1-S2, regular, no murmur auscultated, trace edema in both feet  7. Gastrointestinal:  Abdomen is soft, nontender, no organomegaly      Data Review:    CBC Recent Labs  Lab 01/02/20 2203  WBC 9.7  HGB 6.2*  HCT 24.0*  PLT 495*  MCV 79.7*  MCH 20.6*  MCHC 25.8*  RDW 18.3*   ------------------------------------------------------------------------------------------------------------------  Results for orders placed or performed during the hospital encounter of 01/02/20 (from the past 48 hour(s))  CBC     Status: Abnormal   Collection Time: 01/02/20 10:03 PM  Result Value Ref Range   WBC 9.7 4.0 - 10.5 K/uL   RBC 3.01 (L) 3.87 - 5.11 MIL/uL   Hemoglobin 6.2 (LL) 12.0 - 15.0 g/dL    Comment: REPEATED TO VERIFY THIS CRITICAL RESULT HAS VERIFIED AND BEEN CALLED TO H EVANS,RN BY MARIE KELLY ON 05 27 2021 AT 2237, AND HAS BEEN READ BACK.     HCT 24.0 (L) 36.0 - 46.0 %   MCV 79.7 (L) 80.0 - 100.0 fL   MCH 20.6 (L) 26.0 - 34.0 pg   MCHC 25.8 (L) 30.0 - 36.0 g/dL   RDW 18.3 (H) 11.5 - 15.5 %   Platelets 495 (H) 150 - 400 K/uL   nRBC 0.0 0.0 - 0.2 %     Comment: Performed at [facility], [address], [place], [place] [address]  Comprehensive metabolic panel     Status: Abnormal   Collection Time: 01/02/20 10:03 PM  Result Value Ref Range   Sodium 132 (L) 135 - 145 mmol/L   Potassium 3.9 3.5 - 5.1 mmol/L   Chloride 94 (L) 98 - 111 mmol/L   CO2 27 22 - 32 mmol/L   Glucose, Bld 139 (H) 70 - 99 mg/dL    Comment: Glucose reference range applies only to samples taken after fasting for at least 8 hours.   BUN 9 8 - 23 mg/dL   Creatinine, Ser 1.26 (H) 0.44 - 1.00 mg/dL   Calcium 8.8 (L) 8.9 - 10.3 mg/dL   Total Protein 7.4 6.5 - 8.1 g/dL   Albumin 4.2 3.5 - 5.0 g/dL   AST 30 15 - 41 U/L   ALT 21 0 - 44 U/L   Alkaline Phosphatase 68 38 - 126 U/L   Total Bilirubin 0.4 0.3 - 1.2 mg/dL   GFR calc non Af Amer 44 (L) >60 mL/min   GFR calc Af Amer 51 (L) >60 mL/min   Anion gap 11 5 - 15    Comment: Performed at [facility], [address], [place],  [address]  Brain natriuretic peptide  Status: None   Collection Time: 01/02/20 10:03 PM  Result Value Ref Range   B Natriuretic Peptide 37.0 0.0 - 100.0 pg/mL    Comment: Performed at [facility], [address] [place], [place] [address]  Protime-INR     Status: None   Collection Time: 01/02/20 10:03 PM  Result Value Ref Range   Prothrombin Time 13.9 11.4 - 15.2 seconds   INR 1.1 0.8 - 1.2    Comment: (NOTE) INR goal varies based on device and disease states. Performed at [facility], [address], [place], [place] [address]   Troponin I (High Sensitivity)     Status: None   Collection Time: 01/02/20 10:03 PM  Result Value Ref Range   Troponin I (High Sensitivity) 9 <18 ng/L    Comment: (NOTE) Elevated high sensitivity troponin I (hsTnI) values and significant  changes across serial measurements may suggest ACS but many other  chronic and acute conditions are known to elevate hsTnI results.  Refer to the "Links" section for chest pain algorithms and additional   guidance. Performed at [facility], [address] [place], [place] [address]   Type and screen [facility]     Status: None (Preliminary result)   Collection Time: 01/02/20 10:59 PM  Result Value Ref Range   ABO/RH(D) A NEG    Antibody Screen NEG    Sample Expiration 01/05/2020,2359    Unit Number [id number]    Blood Component Type RED CELLS,LR    Unit division 00    Status of Unit ALLOCATED    Transfusion Status OK TO TRANSFUSE    Crossmatch Result Compatible    Unit Number [id number]    Blood Component Type RED CELLS,LR    Unit division 00    Status of Unit ISSUED    Transfusion Status OK TO TRANSFUSE    Crossmatch Result      Compatible Performed at [facility], [address], [place], [place] [address]   Prepare RBC (crossmatch)     Status: None   Collection Time: 01/02/20 10:59 PM  Result Value Ref Range   Order Confirmation      ORDER PROCESSED BY BLOOD BANK Performed at [facility], [address] [place], [place] [address]   SARS Coronavirus 2 by RT PCR (hospital order, performed in [place] hospital lab) Nasopharyngeal Nasopharyngeal Swab     Status: None   Collection Time: 01/02/20 11:15 PM   Specimen: Nasopharyngeal Swab  Result Value Ref Range   SARS Coronavirus 2 NEGATIVE NEGATIVE    Comment: (NOTE) SARS-CoV-2 target nucleic acids are NOT DETECTED. The SARS-CoV-2 RNA is generally detectable in upper and lower respiratory specimens during the acute phase of infection. The lowest concentration of SARS-CoV-2 viral copies this assay can detect is 250 copies / mL. A negative result does not preclude SARS-CoV-2 infection and should not be used as the sole basis for treatment or other patient management decisions.  A negative result may occur with improper specimen collection / handling, submission of specimen other than nasopharyngeal swab, presence of viral mutation(s) within the areas targeted by this assay, and inadequate number of viral  copies (<250 copies / mL). A negative result must be combined with clinical observations, patient history, and epidemiological information. Fact Sheet for Patients:   StrictlyIdeas.no Fact Sheet for Healthcare Providers: BankingDealers.co.za This test is not yet approved or cleared  by the [place] FDA and has been authorized for detection and/or diagnosis of SARS-CoV-2 by FDA under  an Emergency Use Authorization (EUA).  This EUA will remain in effect (meaning this test can be used) for the duration of the COVID-19 declaration under Section 564(b)(1) of the Act, 21 U.S.C. section 360bbb-3(b)(1), unless the authorization is terminated or revoked sooner. Performed at [facility], [address] [place], [place] [address]   POC occult blood, ED     Status: None   Collection Time: 01/02/20 11:47 PM  Result Value Ref Range   Fecal Occult Bld NEGATIVE NEGATIVE  Troponin I (High Sensitivity)     Status: None   Collection Time: 01/03/20  1:23 AM  Result Value Ref Range   Troponin I (High Sensitivity) 8 <18 ng/L    Comment: (NOTE) Elevated high sensitivity troponin I (hsTnI) values and significant  changes across serial measurements may suggest ACS but many other  chronic and acute conditions are known to elevate hsTnI results.  Refer to the "Links" section for chest pain algorithms and additional  guidance. Performed at [facility], [address] [place], [place] [address]     Chemistries  Recent Labs  Lab 01/02/20 2203  NA 132*  K 3.9  CL 94*  CO2 27  GLUCOSE 139*  BUN 9  CREATININE 1.26*  CALCIUM 8.8*  AST 30  ALT 21  ALKPHOS 68  BILITOT 0.4   ------------------------------------------------------------------------------------------------------------------  ------------------------------------------------------------------------------------------------------------------ GFR: Estimated Creatinine Clearance: 49.2  mL/min (A) (by C-G formula based on SCr of 1.26 mg/dL (H)). Liver Function Tests: Recent Labs  Lab 01/02/20 2203  AST 30  ALT 21  ALKPHOS 68  BILITOT 0.4  PROT 7.4  ALBUMIN 4.2   No results for input(s): LIPASE, AMYLASE in the last 168 hours. No results for input(s): AMMONIA in the last 168 hours. Coagulation Profile: Recent Labs  Lab 01/02/20 2203  INR 1.1    --------------------------------------------------------------------------------------------------------------- Urine analysis:    Component Value Date/Time   COLORURINE STRAW (A) 06/08/2019 0103   APPEARANCEUR CLEAR 06/08/2019 0103   LABSPEC 1.005 06/08/2019 0103   PHURINE 7.0 06/08/2019 0103   GLUCOSEU NEGATIVE 06/08/2019 0103   HGBUR NEGATIVE 06/08/2019 0103   BILIRUBINUR NEGATIVE 06/08/2019 0103   KETONESUR NEGATIVE 06/08/2019 0103   PROTEINUR NEGATIVE 06/08/2019 0103   UROBILINOGEN 0.2 06/08/2011 1400   NITRITE NEGATIVE 06/08/2019 0103   LEUKOCYTESUR NEGATIVE 06/08/2019 0103      Imaging Results:    DG Chest Port 1 View  Result Date: 01/02/2020 CLINICAL DATA:  Shortness of breath EXAM: PORTABLE CHEST 1 VIEW COMPARISON:  06/08/2019 FINDINGS: Mild cardiomegaly. Streaky atelectasis or scar at the left base. Mild chronic bronchitic changes. Aortic atherosclerosis. No pneumothorax. IMPRESSION: Mild chronic appearing bronchitic changes with streaky atelectasis or scar at the left base. Electronically Signed   By: Donavan Foil M.D.   On: 01/02/2020 23:36    My personal review of EKG: Rhythm NSR, baseline wander in lead V2 to V6   Assessment & Plan:    Active Problems:   Anemia   1. Symptomatic anemia-patient presenting with worsening shortness of breath, lower extremity edema.  2 units PRBC have been ordered by the ED physician.  Follow CBC after blood transfusion. 2. Acute kidney injury-patient baseline creatinine around 1.08, today presenting with creatinine of 1.26.  She does take Lasix at home.  2  units PRBC have been ordered as above.  We will also continue with Lasix 20 mg p.o. daily. 3. Hyperlipidemia-continue gemfibrozil 4. History of Crohns disease-Hemoccult is negative in the ED.  Patient to follow-up with GI and  hematology as outpatient. 5. GERD-continue Protonix 6. Fibromyalgia-continue Cymbalta, doxepin, gabapentin, OxyContin. 7. Anxiety-continue diazepam 0.5 mg p.o. 3 times daily as needed    DVT Prophylaxis-    SCDs  AM Labs Ordered, also please review Full Orders  Family Communication: Admission, patients condition and plan of care including tests being ordered have been discussed with the patient  who indicate understanding and agree with the plan and Code Status.  Code Status: Full code  Admission status: Observation  Time spent in minutes : 60 minutes   Keeshawn Fakhouri S Klever [place] M.D

## 2020-01-03 NOTE — Discharge Summary (Signed)
Physician Discharge Summary [facility]    Patient: Kayla Ryan                   Admit date: 01/02/2020   DOB: August 27, 1955             Discharge date:01/03/2020/12:09 PM NAT:[id number]                          PCP: [name], PA-C  Disposition: Home   Recommendations for Outpatient Follow-up:   . Follow up: in 1 week  . With PCP, hematologist Dr. Harlow Asa  Discharge Condition: Stable   Code Status:   Code Status: Full Code  Diet recommendation: Cardiac diet   Discharge Diagnoses:    Active Problems:   Anemia    History of Present Illness/ Hospital Course Kayla Ryan Summary:  Kayla Ryan  is a 64 y.o. female, with history of hyperlipidemia, fibromyalgia, muscular dystrophy, Crohn's disease, IBS, COPD, chronic anemia presented to the ED with complaints of increasing leg swelling and shortness of breath.  Patient has history of chronic anemia and has been evaluated by hematology at [facility].  Patient has a history of Crohn disease and has been followed by GI as outpatient.  She denies vomiting any blood.  Denies noting any blood in the stool.  In the ED her hemoglobin was found to be low at 6.2, her baseline hemoglobin is around 7.3. Denies black-colored stool. In the ED Hemoccult was negative 2 units PRBC were ordered in the ED. She denies chest pain Denies abdominal pain or dysuria No history of stroke or seizures Denies fever or cough.   1. Symptomatic anemia-patient presenting with worsening shortness of breath, lower extremity edema.  2 units PRBC have been ordered by the ED physician.  --Status post blood transfusion, H&H remained stable. 2. Acute kidney injury-patient baseline creatinine around 1.08, today presenting with creatinine of 1.26.  She does take Lasix at home.  2 units PRBC was transfused..  We will also continue with Lasix 20 mg p.o. daily. 3. Hyperlipidemia-continue gemfibrozil 4. History of Crohns disease-Hemoccult is negative in the ED.   Patient to follow-up with GI and hematology as outpatient. 5. GERD-continue Protonix 6. Fibromyalgia-continue Cymbalta, doxepin, gabapentin, OxyContin. 7. Anxiety-continue diazepam 0.5 mg p.o. 3 times daily as needed   Patient remained stable post 2 units of PRBC transfusion.  Instructed follow-up with PCP and primary hematologist Dr. Harlow Asa. Side note patient reports that she receives special IV iron with Dr. Harlow Asa as she is allergic to p.o. iron supplements.  Discharge Instructions:   Discharge Instructions    Activity as tolerated - No restrictions   Complete by: As directed    Call MD for:  difficulty breathing, headache or visual disturbances   Complete by: As directed    Call MD for:  extreme fatigue   Complete by: As directed    Call MD for:  persistant dizziness or light-headedness   Complete by: As directed    Call MD for:  temperature >100.4   Complete by: As directed    Diet - low sodium heart healthy   Complete by: As directed    Discharge instructions   Complete by: As directed    Please follow-up with your hematologist Dr. Laverle Hobby for further evaluation of your anemia, and iron supplements. Please follow-up specifically with your PCP regarding your current medication modification.   Increase activity slowly   Complete by: As directed  Medication List    STOP taking these medications   methocarbamol 500 MG tablet Commonly known as: ROBAXIN     TAKE these medications   albuterol 108 (90 Base) MCG/ACT inhaler Commonly known as: ProAir HFA Inhale 2 puffs into the lungs every 6 (six) hours as needed for wheezing or shortness of breath.   aspirin 81 MG tablet Take 1 tablet (81 mg total) by mouth daily.   Coenzyme Q10 10 MG capsule Take 40 mg by mouth daily. 4 capsules po qd   diclofenac sodium 1 % Gel Commonly known as: VOLTAREN Apply 2 g topically 4 (four) times daily as needed (for pain).   doxepin 10 MG capsule Commonly known as: SINEQUAN Take 10  mg by mouth at bedtime.   DULoxetine 60 MG capsule Commonly known as: CYMBALTA Take 60 mg by mouth 2 (two) times daily.   estradiol 1 MG tablet Commonly known as: ESTRACE Take 1 mg by mouth daily.   fluticasone 50 MCG/ACT nasal spray Commonly known as: FLONASE Place 1 spray into both nostrils daily as needed for allergies.   furosemide 20 MG tablet Commonly known as: Lasix Take 1 tablet (20 mg total) by mouth daily.   gemfibrozil 600 MG tablet Commonly known as: LOPID Take 600 mg by mouth 2 (two) times daily before a meal.   isosorbide mononitrate 30 MG 24 hr tablet Commonly known as: IMDUR Take 30 mg by mouth daily.   l-methylfolate-B6-B12 3-35-2 MG Tabs tablet Commonly known as: METANX Take 1 tablet by mouth daily.   loratadine 10 MG tablet Commonly known as: CLARITIN Take 10 mg by mouth 2 (two) times daily.   LORazepam 0.5 MG tablet Commonly known as: ATIVAN Take 0.5 mg by mouth 3 (three) times daily.   magnesium hydroxide 400 MG/5ML suspension Commonly known as: MILK OF MAGNESIA Take 15 mLs by mouth at bedtime.   Melatonin TR 10 MG Tbcr Generic drug: Melatonin Take 10 mg by mouth at bedtime.   montelukast 10 MG tablet Commonly known as: SINGULAIR Take 10 mg by mouth daily.   multivitamin capsule Take 1 capsule by mouth daily.   Nitrostat 0.4 MG SL tablet Generic drug: nitroGLYCERIN Place 0.4 mg under the tongue every 15 (fifteen) minutes as needed for chest pain.   omeprazole 40 MG capsule Commonly known as: PRILOSEC Take 1 capsule (40 mg total) by mouth daily. What changed: when to take this   oxyCODONE 5 MG immediate release tablet Commonly known as: Oxy IR/ROXICODONE   polyethylene glycol 17 g packet Commonly known as: MiraLax Take 17 g by mouth daily.   Potassium Chloride ER 20 MEQ Tbcr Take 20 mEq by mouth daily.   valACYclovir 500 MG tablet Commonly known as: VALTREX Take 500 mg by mouth 2 (two) times daily.   Vitamin D  (Ergocalciferol) 1.25 MG (50000 UNIT) Caps capsule Commonly known as: DRISDOL Take 1 Units by mouth once a week.   Xtampza ER 18 MG C12a Generic drug: oxyCODONE ER Take 1 capsule by mouth 2 (two) times daily.       Allergies  Allergen Reactions  . Sulfa Antibiotics Shortness Of Breath, Swelling and Rash  . Dilaudid [Hydromorphone Hcl]     Makes me crazy  . Metronidazole Diarrhea and Nausea And Vomiting  . Prednisone Other (See Comments)    angry angry   . Cephalexin Diarrhea and Nausea And Vomiting  . Methotrexate Derivatives Swelling and Rash  . Morphine And Related Anxiety    "exreme mood  swings"     Procedures /Studies:   DG Chest Port 1 View  Result Date: 01/02/2020 CLINICAL DATA:  Shortness of breath EXAM: PORTABLE CHEST 1 VIEW COMPARISON:  06/08/2019 FINDINGS: Mild cardiomegaly. Streaky atelectasis or scar at the left base. Mild chronic bronchitic changes. Aortic atherosclerosis. No pneumothorax. IMPRESSION: Mild chronic appearing bronchitic changes with streaky atelectasis or scar at the left base. Electronically Signed   By: Donavan Foil M.D.   On: 01/02/2020 23:36     Subjective:   Patient was seen and examined 01/03/2020, 12:09 PM Patient stable today. No acute distress.  No issues overnight Stable for discharge.  Discharge Exam:    Vitals:   01/03/20 0526 01/03/20 1004 01/03/20 1015 01/03/20 1049  BP: 139/61 (!) 121/51  (!) 141/56  Pulse: 85 87  91  Resp: 15 18  20   Temp: 98.6 F (37 C)  99.2 F (37.3 C) 98.7 F (37.1 C)  TempSrc: Oral  Oral Oral  SpO2: 99% 92%  100%  Weight:      Height:        General: Pt lying comfortably in bed & appears in no obvious distress. Cardiovascular: S1 & S2 heard, RRR, S1/S2 +. No murmurs, rubs, gallops or clicks. No JVD or pedal edema. Respiratory: Clear to auscultation without wheezing, rhonchi or crackles. No increased work of breathing. Abdominal:  Non-distended, non-tender & soft. No organomegaly or masses  appreciated. Normal bowel sounds heard. CNS: Alert and oriented. No focal deficits. Extremities: no edema, no cyanosis    The results of significant diagnostics from this hospitalization (including imaging, microbiology, ancillary and laboratory) are listed below for reference.      Microbiology:   Recent Results (from the past 240 hour(s))  SARS Coronavirus 2 by RT PCR (hospital order, performed in [facility] hospital lab) Nasopharyngeal Nasopharyngeal Swab     Status: None   Collection Time: 01/02/20 11:15 PM   Specimen: Nasopharyngeal Swab  Result Value Ref Range Status   SARS Coronavirus 2 NEGATIVE NEGATIVE Final    Comment: (NOTE) SARS-CoV-2 target nucleic acids are NOT DETECTED. The SARS-CoV-2 RNA is generally detectable in upper and lower respiratory specimens during the acute phase of infection. The lowest concentration of SARS-CoV-2 viral copies this assay can detect is 250 copies / mL. A negative result does not preclude SARS-CoV-2 infection and should not be used as the sole basis for treatment or other patient management decisions.  A negative result may occur with improper specimen collection / handling, submission of specimen other than nasopharyngeal swab, presence of viral mutation(s) within the areas targeted by this assay, and inadequate number of viral copies (<250 copies / mL). A negative result must be combined with clinical observations, patient history, and epidemiological information. Fact Sheet for Patients:   StrictlyIdeas.no Fact Sheet for Healthcare Providers: BankingDealers.co.za This test is not yet approved or cleared  by the [place] FDA and has been authorized for detection and/or diagnosis of SARS-CoV-2 by FDA under an Emergency Use Authorization (EUA).  This EUA will remain in effect (meaning this test can be used) for the duration of the COVID-19 declaration under Section 564(b)(1) of the  Act, 21 U.S.C. section 360bbb-3(b)(1), unless the authorization is terminated or revoked sooner. Performed at [facility], [address], [place], [place] [address]      Labs:   CBC: Recent Labs  Lab 01/02/20 2203 01/03/20 0813  WBC 9.7 8.0  HGB 6.2* 7.0*  HCT 24.0* 25.9*  MCV 79.7* 80.7  PLT 495* 856*   Basic Metabolic Panel: Recent Labs  Lab 01/02/20 2203 01/03/20 0813  NA 132* 134*  K 3.9 4.3  CL 94* 96*  CO2 27 28  GLUCOSE 139* 140*  BUN 9 10  CREATININE 1.26* 1.14*  CALCIUM 8.8* 8.8*   Liver Function Tests: Recent Labs  Lab 01/02/20 2203 01/03/20 0813  AST 30 27  ALT 21 20  ALKPHOS 68 59  BILITOT 0.4 0.8  PROT 7.4 6.9  ALBUMIN 4.2 3.8   BNP (last 3 results) Recent Labs    06/07/19 2215 01/02/20 2203  BNP 78.0 37.0  Urinalysis    Component Value Date/Time   COLORURINE STRAW (A) 06/08/2019 0103   APPEARANCEUR CLEAR 06/08/2019 0103   LABSPEC 1.005 06/08/2019 0103   PHURINE 7.0 06/08/2019 0103   GLUCOSEU NEGATIVE 06/08/2019 0103   HGBUR NEGATIVE 06/08/2019 0103   BILIRUBINUR NEGATIVE 06/08/2019 0103   KETONESUR NEGATIVE 06/08/2019 0103   PROTEINUR NEGATIVE 06/08/2019 0103   UROBILINOGEN 0.2 06/08/2011 1400   NITRITE NEGATIVE 06/08/2019 0103   LEUKOCYTESUR NEGATIVE 06/08/2019 0103         Time coordinating discharge: Over 71 minutes  SIGNED: Deatra James, MD, FACP, FHM. [facility],  Please use [email or web address] to Page If 7PM-7AM, please contact night-coverage [email or web address] 01/03/2020, 12:09 PM

## 2020-01-03 NOTE — Plan of Care (Signed)
Patient discharged.

## 2020-01-03 NOTE — Plan of Care (Signed)
Blood consent signed by patient and Probation officer 09:48. Placed in chart.

## 2020-01-04 LAB — TYPE AND SCREEN
ABO/RH(D): A NEG
Antibody Screen: NEGATIVE
Unit division: 0
Unit division: 0

## 2020-01-04 LAB — BPAM RBC
Blood Product Expiration Date: 202106082359
Blood Product Expiration Date: 202106082359
ISSUE DATE / TIME: 202105280347
ISSUE DATE / TIME: 202105281005
Unit Type and Rh: 600
Unit Type and Rh: 600

## 2020-01-31 ENCOUNTER — Other Ambulatory Visit: Payer: Self-pay

## 2020-01-31 ENCOUNTER — Ambulatory Visit: Payer: Medicare Other | Admitting: Emergency Medicine

## 2020-01-31 ENCOUNTER — Encounter: Payer: Self-pay | Admitting: Emergency Medicine

## 2020-01-31 DIAGNOSIS — R0602 Shortness of breath: Secondary | ICD-10-CM | POA: Diagnosis not present

## 2020-01-31 DIAGNOSIS — R0600 Dyspnea, unspecified: Secondary | ICD-10-CM | POA: Insufficient documentation

## 2020-01-31 NOTE — Progress Notes (Signed)
Subjective:    Patient ID: Kayla Ryan, female    DOB: September 19, 1955, 64 y.o.   MRN: [id number]  HPI 64 year old former smoker (25 pack years) with a history of inflammatory myopathy, fibromyalgia, hyperlipidemia, Crohn's disease and IBS, chronic anemia.  She carries a history of COPD/asthma that was made at [facility] w PFT's in 2007.  Has been on advair before, not currently. She has DuoNeb to use prn, tries to avoid due to jitteriness. She is using albuterol HFA about 4x a day - believes it probably helps her.  She was admitted at the end of May for symptomatic anemia and lower extremity edema.  She received blood products with some subjective improvement.   Referred today for evaluation of shortness of breath. Longstanding but worse over the last 2 years. Happens with just walking a few feet.  She has allergies and nasal gtt, also frequent GERD.  She is currently on Singulair, Zyrtec, saline spray.  Has Flonase available to use as needed.  She has albuterol and uses 3-4x a day.   Review of Systems As per HPI  Past Medical History:  Diagnosis Date  . Arthritis   . Asthma   . Chest pain   . Chronic bronchitis   . COPD (chronic obstructive pulmonary disease) ([place])   . Crohn disease ([place])   . Emphysema   . Emphysema of lung ([place])   . Fibromyalgia   . Herpes   . Hyperlipidemia   . IBS (irritable bowel syndrome)   . Migraines   . Muscular dystrophy ([place])   . Neck pain   . Plantar fasciitis   . Scoliosis      Family History  Problem Relation Age of Onset  . Lung cancer Mother   . COPD Father   . Lung cancer Father   . Lymphoma Father      Social History   Socioeconomic History  . Marital status: Married    Spouse name: Not on file  . Number of children: Not on file  . Years of education: Not on file  . Highest education level: Not on file  Occupational History  . Not on file  Tobacco Use  . Smoking status: Former Smoker    Types: Cigarettes    Quit date:  07/03/2011    Years since quitting: 8.5  . Smokeless tobacco: Never Used  . Tobacco comment: Originally quit 2007 for 5 years then restarted in 2012 for 3 months then quit  Substance and Sexual Activity  . Alcohol use: No  . Drug use: No  . Sexual activity: Yes    Birth control/protection: Surgical  Other Topics Concern  . Not on file  Social History Narrative  . Not on file   Social Determinants of Health   Financial Resource Strain:   . Difficulty of Paying Living Expenses:   Food Insecurity:   . Worried About Charity fundraiser in the Last Year:   . Arboriculturist in the Last Year:   Transportation Needs:   . Film/video editor (Medical):   Marland Kitchen Lack of Transportation (Non-Medical):   Physical Activity:   . Days of Exercise per Week:   . Minutes of Exercise per Session:   Stress:   . Feeling of Stress :   Social Connections:   . Frequency of Communication with Friends and Family:   . Frequency of Social Gatherings with Friends and Family:   . Attends Religious Services:   . Active Member  of Clubs or Organizations:   . Attends Archivist Meetings:   Marland Kitchen Marital Status:   Intimate Partner Violence:   . Fear of Current or Ex-Partner:   . Emotionally Abused:   Marland Kitchen Physically Abused:   . 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  From [place], lived in [place], [place], [place].   Allergies  Allergen Reactions  . Sulfa Antibiotics Shortness Of Breath, Swelling and Rash  . Dilaudid [Hydromorphone Hcl]     Makes me crazy  . Metronidazole Diarrhea and Nausea And Vomiting  . Prednisone Other (See Comments)    angry angry   . Cephalexin Diarrhea and Nausea And Vomiting  . Methotrexate Derivatives Swelling and Rash  . Morphine And Related Anxiety    "exreme mood swings"     Outpatient Medications Prior to Visit  Medication Sig Dispense Refill  . albuterol (PROAIR HFA) 108 (90 Base) MCG/ACT inhaler Inhale 2 puffs into the lungs every 6  (six) hours as needed for wheezing or shortness of breath. 18 g 1  . aspirin 81 MG tablet Take 1 tablet (81 mg total) by mouth daily.    . cetirizine (ZYRTEC) 10 MG tablet Take 10 mg by mouth daily.    . Coenzyme Q10 10 MG capsule Take 40 mg by mouth daily. 4 capsules po qd    . diclofenac sodium (VOLTAREN) 1 % GEL Apply 2 g topically 4 (four) times daily as needed (for pain). 5 Tube 1  . doxepin (SINEQUAN) 10 MG capsule Take 10 mg by mouth at bedtime.    . DULoxetine (CYMBALTA) 60 MG capsule Take 60 mg by mouth 2 (two) times daily.    Marland Kitchen estradiol (ESTRACE) 1 MG tablet Take 1 mg by mouth daily.    . furosemide (LASIX) 20 MG tablet Take 1 tablet (20 mg total) by mouth daily. 30 tablet 1  . gabapentin (NEURONTIN) 300 MG capsule Take 300 mg by mouth daily. Take 3 PO daily    . gemfibrozil (LOPID) 600 MG tablet Take 600 mg by mouth 2 (two) times daily before a meal.      . ipratropium-albuterol (DUONEB) 0.5-2.5 (3) MG/3ML SOLN Take 3 mLs by nebulization every 4 (four) hours as needed.    . isosorbide mononitrate (IMDUR) 30 MG 24 hr tablet Take 30 mg by mouth daily.    Marland Kitchen l-methylfolate-B6-B12 (METANX) 3-35-2 MG TABS Take 1 tablet by mouth daily.    Marland Kitchen LORazepam (ATIVAN) 0.5 MG tablet Take 0.5 mg by mouth 3 (three) times daily.     . magnesium hydroxide (MILK OF MAGNESIA) 400 MG/5ML suspension Take 15 mLs by mouth at bedtime.    . Melatonin (MELATONIN TR) 10 MG TBCR Take 10 mg by mouth at bedtime.    . montelukast (SINGULAIR) 10 MG tablet Take 10 mg by mouth daily.    . Multiple Vitamin (MULTIVITAMIN) capsule Take 1 capsule by mouth daily.      Marland Kitchen NITROSTAT 0.4 MG SL tablet Place 0.4 mg under the tongue every 15 (fifteen) minutes as needed for chest pain.     Marland Kitchen omeprazole (PRILOSEC) 40 MG capsule Take 1 capsule (40 mg total) by mouth daily. 30 capsule 1  . oxyCODONE (OXY IR/ROXICODONE) 5 MG immediate release tablet     . polyethylene glycol (MIRALAX) 17 g packet Take 17 g by mouth daily. 28 each 0  .  valACYclovir (VALTREX) 500 MG tablet Take 500 mg by mouth 2 (two)  times daily.      . Vitamin D, Ergocalciferol, (DRISDOL) 50000 UNITS CAPS capsule Take 1 Units by mouth once a week.    Marland Kitchen XTAMPZA ER 18 MG C12A Take 1 capsule by mouth 2 (two) times daily.    . fluticasone (FLONASE) 50 MCG/ACT nasal spray Place 1 spray into both nostrils daily as needed for allergies.  (Patient not taking: Reported on 01/31/2020)    . loratadine (CLARITIN) 10 MG tablet Take 10 mg by mouth 2 (two) times daily.   (Patient not taking: Reported on 01/31/2020)    . potassium chloride 20 MEQ TBCR Take 20 mEq by mouth daily. 30 tablet 0   No facility-administered medications prior to visit.        Objective:   Physical Exam Vitals:   01/31/20 1632  BP: 140/70  Pulse: 95  Temp: 98.6 F (37 C)  SpO2: 94%  Weight: 169 lb 9.6 oz (76.9 kg)  Height: 5' 8"  (1.727 m)   Gen: Pleasant, well-nourished, in no distress,  normal affect  ENT: No lesions,  mouth clear,  oropharynx clear, no postnasal drip  Neck: No JVD, no stridor  Lungs: No use of accessory muscles, no crackles or wheezing on normal respiration, no wheeze on forced expiration  Cardiovascular: RRR, heart sounds normal, no murmur or gallops, no peripheral edema  Musculoskeletal: No deformities, no cyanosis or clubbing  Neuro: alert, awake, non focal  Skin: Warm, no lesions or rash     Assessment & Plan:  Dyspnea Longstanding but progressive dyspnea now with significant functional limitation.  She gets short of breath just with walking through a room.  She has a tobacco history and carries a diagnosis of COPD although severity unclear.  She may respond to albuterol, uses frequently through the day.  We will confirm degree of obstruction with pulmonary function testing and then consider alternative scheduled bronchodilators.  Consider also impact of her chronic myopathy/myositis, possible associated restrictive disease.  She has a history of Crohn's, is  at some risk for ILD although no clear evidence of this on her chest x-ray from May.  She does have some basilar atelectasis and scar.  Depending on her PFT results she may merit a CT chest to evaluate for interstitial disease.  We will perform a walking oximetry today to ensure she does not have occult hypoxemia.  Follow-up after her PFT to review and plan next steps.  Walking oximetry today We will perform pulmonary function testing at your next office visit Depending on your testing we will decide whether you need further testing Follow with Dr. Lamonte Sakai next available with full pulmonary function testing on the same day   Baltazar Apo, MD, PhD 01/31/2020, 5:02 PM [facility] [phone] or if no answer [phone]

## 2020-01-31 NOTE — Patient Instructions (Signed)
Walking oximetry today We will perform pulmonary function testing at your next office visit Depending on your testing we will decide whether you need further testing Follow with Dr. Lamonte Sakai next available with full pulmonary function testing on the same day.

## 2020-01-31 NOTE — Assessment & Plan Note (Signed)
Longstanding but progressive dyspnea now with significant functional limitation.  She gets short of breath just with walking through a room.  She has a tobacco history and carries a diagnosis of COPD although severity unclear.  She may respond to albuterol, uses frequently through the day.  We will confirm degree of obstruction with pulmonary function testing and then consider alternative scheduled bronchodilators.  Consider also impact of her chronic myopathy/myositis, possible associated restrictive disease.  She has a history of Crohn's, is at some risk for ILD although no clear evidence of this on her chest x-ray from May.  She does have some basilar atelectasis and scar.  Depending on her PFT results she may merit a CT chest to evaluate for interstitial disease.  We will perform a walking oximetry today to ensure she does not have occult hypoxemia.  Follow-up after her PFT to review and plan next steps.  Walking oximetry today We will perform pulmonary function testing at your next office visit Depending on your testing we will decide whether you need further testing Follow with Dr. Lamonte Sakai next available with full pulmonary function testing on the same day

## 2020-02-12 ENCOUNTER — Institutional Professional Consult (permissible substitution): Payer: Medicare Other | Admitting: Pulmonary Disease

## 2020-03-17 ENCOUNTER — Other Ambulatory Visit: Payer: Self-pay | Admitting: *Deleted

## 2020-03-17 DIAGNOSIS — R0602 Shortness of breath: Secondary | ICD-10-CM

## 2020-03-18 ENCOUNTER — Ambulatory Visit: Payer: Medicare Other | Admitting: Emergency Medicine

## 2020-04-01 ENCOUNTER — Ambulatory Visit: Payer: Medicare Other | Admitting: Primary Care

## 2020-04-28 ENCOUNTER — Encounter: Payer: Self-pay | Admitting: Pulmonary Disease

## 2020-04-28 ENCOUNTER — Other Ambulatory Visit: Payer: Self-pay

## 2020-04-28 ENCOUNTER — Ambulatory Visit (INDEPENDENT_AMBULATORY_CARE_PROVIDER_SITE_OTHER): Payer: Medicare Other | Admitting: Emergency Medicine

## 2020-04-28 ENCOUNTER — Ambulatory Visit: Payer: Medicare Other | Admitting: Pulmonary Disease

## 2020-04-28 ENCOUNTER — Telehealth: Payer: Self-pay | Admitting: Pulmonary Disease

## 2020-04-28 VITALS — BP 140/80 | HR 97 | Temp 97.5°F | Ht 69.0 in | Wt 169.9 lb

## 2020-04-28 DIAGNOSIS — J309 Allergic rhinitis, unspecified: Secondary | ICD-10-CM | POA: Diagnosis not present

## 2020-04-28 DIAGNOSIS — R0602 Shortness of breath: Secondary | ICD-10-CM | POA: Diagnosis not present

## 2020-04-28 DIAGNOSIS — R0789 Other chest pain: Secondary | ICD-10-CM

## 2020-04-28 DIAGNOSIS — J411 Mucopurulent chronic bronchitis: Secondary | ICD-10-CM

## 2020-04-28 LAB — PULMONARY FUNCTION TEST
DL/VA % pred: 77 %
DL/VA: 3.12 ml/min/mmHg/L
DLCO cor % pred: 57 %
DLCO cor: 13.31 ml/min/mmHg
DLCO unc % pred: 57 %
DLCO unc: 13.31 ml/min/mmHg
FEF 25-75 Pre: 0.65 L/sec
FEF2575-%Pred-Pre: 27 %
FEV1-%Pred-Pre: 44 %
FEV1-Pre: 1.27 L
FEV1FVC-%Pred-Pre: 77 %
FEV6-%Pred-Pre: 58 %
FEV6-Pre: 2.12 L
FEV6FVC-%Pred-Pre: 103 %
FVC-%Pred-Pre: 57 %
FVC-Pre: 2.15 L
Pre FEV1/FVC ratio: 59 %
Pre FEV6/FVC Ratio: 100 %

## 2020-04-28 MED ORDER — STIOLTO RESPIMAT 2.5-2.5 MCG/ACT IN AERS
2.0000 | INHALATION_SPRAY | Freq: Every day | RESPIRATORY_TRACT | 0 refills | Status: DC
Start: 2020-04-28 — End: 2020-06-08

## 2020-04-28 NOTE — Patient Instructions (Addendum)
You were seen today by Lauraine Rinne, NP  for:   1. Shortness of breath  - CT Chest High Resolution; Future  Walk today in office  2. Mucopurulent chronic bronchitis (HCC)  Trial of Stiolto Respimat inhaler >>>2 puffs daily >>>Take this no matter what >>>This is not a rescue inhaler  Note your daily symptoms > remember "red flags" for COPD:   >>>Increase in cough >>>increase in sputum production >>>increase in shortness of breath or activity  intolerance.   If you notice these symptoms, please call the office to be seen.   3. Allergic rhinitis, unspecified seasonality, unspecified trigger  Continue taking your daily allergy medications  Continue Singulair  Continue nasal saline rinses twice daily Use distilled water Shake well Get bottle lukewarm like a baby bottle  4. Other chest pain  I am glad that your chest pain has improved.  If you continue to have ongoing atypical chest pain like what she had in our office today while doing your breathing test please present to either primary care or an urgent care for further evaluation   We recommend today:  Orders Placed This Encounter  Procedures  . CT Chest High Resolution    -High-res CT with supine and prone positioning -inspiratory and expiratory cuts.  -Only to be read by Dr. Rosario Jacks and Dr. Weber Cooks.    Standing Status:   Future    Standing Expiration Date:   04/28/2021    Order Specific Question:   Preferred imaging location?    Answer:   [facility]    Order Specific Question:   Radiology Contrast Protocol - do NOT remove file path    Answer:   [email or web address]   Orders Placed This Encounter  Procedures  . CT Chest High Resolution   No orders of the defined types were placed in this encounter.   Follow Up:    Return in about 3 months (around 07/28/2020), or if symptoms worsen or fail to improve, for Follow up with Dr. Lamonte Sakai.   Notification of test  results are managed in the following manner: If there are  any recommendations or changes to the  plan of care discussed in office today,  we will contact you and let you know what they are. If you do not hear from [place], then your results are normal and you can view them through your  MyChart account , or a letter will be sent to you. Thank you again for trusting [place] with your care  - Thank you, [facility]    It is flu season:   >>> Best ways to protect herself from the flu: Receive the yearly flu vaccine, practice good hand hygiene washing with soap and also using hand sanitizer when available, eat a nutritious meals, get adequate rest, hydrate appropriately       Please contact the office if your symptoms worsen or you have concerns that you are not improving.   Thank you for choosing [facility] for your healthcare, and for allowing [place] to partner with you on your healthcare journey. I am thankful to be able to provide care to you today.   Wyn Quaker FNP-C

## 2020-04-28 NOTE — Progress Notes (Signed)
@Patient  ID: Kayla Ryan, female    DOB: April 18, 1956, 64 y.o.   MRN: [id number]  Chief Complaint  Patient presents with  . Follow-up    pft     Referring provider: Emelia Loron  HPI:  64 year old female former smoker followed in our office for shortness of breath  PMH: Hyperlipidemia, anxiety, GERD, history of Bell's palsy, history of Crohn's, myopathy Smoker/ Smoking History: Former Smoker. Quit 2012. 49 pack year smoker.  Maintenance: None Pt of: Dr. Lamonte Sakai  04/28/2020  - Visit   64 year old female former smoker followed in our office for shortness of breath.  Followed by Dr. Lamonte Sakai.  Patient presenting today after completing pulmonary function testing.  Unfortunately patient was unable to complete full pulmonary function testing due to having chest pain.  Pulmonary function testing results are listed below:  04/28/2020-spirometry with DLCO-FVC 2.15 (57% addicted), ratio 59, FEV1 1.27 (44% predicted), DLCO 13.31 (57% predicted)  Initially evaluated in June/2021 by Dr. Lamonte Sakai as a consult.  Plan of care from that office visit was to obtain pulmonary function testing, walking test. Patient was walked in our office without any significant oxygen desaturations below 88% on room air. Patient reports that she is currently having to use her rescue inhaler 4 times a day sometimes even more than that. She was only able to complete half of her pulmonary function test today due to starting to have atypical chest pain while doing pulmonary function test. She reports that she took a nitroglycerin tablet as well as removed her bra, she reports that her symptoms have dissipated. She is not currently having acute chest pain during this visit.   Questionaires / Pulmonary Flowsheets:   ACT:  No flowsheet data found.  MMRC: No flowsheet data found.  Epworth:  No flowsheet data found.  Tests:   01/03/2020-hemoglobin-8.5  01/02/2020-chest x-ray-mild chronic appearing bronchitic changes  and streaky atelectasis   FENO:  No results found for: NITRICOXIDE  PFT: PFT Results Latest Ref Rng & Units 04/28/2020  FVC-Pre L 2.15  FVC-Predicted Pre % 57  Pre FEV1/FVC % % 59  FEV1-Pre L 1.27  FEV1-Predicted Pre % 44  DLCO uncorrected ml/min/mmHg 13.31  DLCO UNC% % 57  DLCO corrected ml/min/mmHg 13.31  DLCO COR %Predicted % 57  DLVA Predicted % 77    WALK:  SIX MIN WALK 04/28/2020 01/31/2020  Supplimental Oxygen during Test? (L/min) No No  Tech Comments: - Slowed pace ambulation, no desaturation noted JS/RN    Imaging: No results found.  Lab Results:  CBC    Component Value Date/Time   WBC 8.0 01/03/2020 0813   RBC 3.21 (L) 01/03/2020 0813   HGB 8.5 (L) 01/03/2020 1319   HCT 30.4 (L) 01/03/2020 1319   PLT 440 (H) 01/03/2020 0813   MCV 80.7 01/03/2020 0813   MCH 21.8 (L) 01/03/2020 0813   MCHC 27.0 (L) 01/03/2020 0813   RDW 17.9 (H) 01/03/2020 0813   LYMPHSABS 3.0 05/24/2014 2035   MONOABS 0.7 05/24/2014 2035   EOSABS 0.2 05/24/2014 2035   BASOSABS 0.1 05/24/2014 2035    BMET    Component Value Date/Time   NA 134 (L) 01/03/2020 0813   K 4.3 01/03/2020 0813   CL 96 (L) 01/03/2020 0813   CO2 28 01/03/2020 0813   GLUCOSE 140 (H) 01/03/2020 0813   BUN 10 01/03/2020 0813   CREATININE 1.14 (H) 01/03/2020 0813   CREATININE 0.88 10/30/2013 1546   CALCIUM 8.8 (L) 01/03/2020 0813   GFRNONAA 50 (  L) 01/03/2020 0813   GFRAA 58 (L) 01/03/2020 0813    BNP    Component Value Date/Time   BNP 37.0 01/02/2020 2203    ProBNP No results found for: PROBNP  Specialty Problems      Pulmonary Problems   Allergic rhinitis   Asthma   Chronic obstructive pulmonary disease (HCC)   Dyspnea      Allergies  Allergen Reactions  . Sulfa Antibiotics Shortness Of Breath, Swelling and Rash  . Dilaudid [Hydromorphone Hcl]     Makes me crazy  . Metronidazole Diarrhea and Nausea And Vomiting  . Prednisone Other (See Comments)    angry angry   . Cephalexin  Diarrhea and Nausea And Vomiting  . Methotrexate Derivatives Swelling and Rash  . Morphine And Related Anxiety    "exreme mood swings"    Immunization History  Administered Date(s) Administered  . Influenza-Unspecified 06/17/2016  . PFIZER SARS-COV-2 Vaccination 10/02/2019, 11/20/2019  . Tdap 10/31/2019    Past Medical History:  Diagnosis Date  . Arthritis   . Asthma   . Chest pain   . Chronic bronchitis   . COPD (chronic obstructive pulmonary disease) ([place])   . Crohn disease ([place])   . Emphysema   . Emphysema of lung ([place])   . Fibromyalgia   . Herpes   . Hyperlipidemia   . IBS (irritable bowel syndrome)   . Migraines   . Muscular dystrophy ([place])   . Neck pain   . Plantar fasciitis   . Scoliosis     Tobacco History: Social History   Tobacco Use  Smoking Status Former Smoker  . Packs/day: 1.00  . Years: 49.00  . Pack years: 49.00  . Types: Cigarettes  . Start date: 08/08/1966  . Quit date: 07/03/2011  . Years since quitting: 8.8  Smokeless Tobacco Never Used  Tobacco Comment   Originally quit 2007 for 5 years then restarted in 2012 for 3 months then quit   Counseling given: Not Answered Comment: Originally quit 2007 for 5 years then restarted in 2012 for 3 months then quit   Continue to not smoke  Outpatient Encounter Medications as of 04/28/2020  Medication Sig  . albuterol (PROAIR HFA) 108 (90 Base) MCG/ACT inhaler Inhale 2 puffs into the lungs every 6 (six) hours as needed for wheezing or shortness of breath.  Marland Kitchen aspirin 81 MG tablet Take 1 tablet (81 mg total) by mouth daily.  . cetirizine (ZYRTEC) 10 MG tablet Take 10 mg by mouth daily.  . Coenzyme Q10 10 MG capsule Take 40 mg by mouth daily. 4 capsules po qd  . diclofenac sodium (VOLTAREN) 1 % GEL Apply 2 g topically 4 (four) times daily as needed (for pain).  Marland Kitchen doxepin (SINEQUAN) 10 MG capsule Take 10 mg by mouth at bedtime.  . DULoxetine (CYMBALTA) 60 MG capsule Take 60 mg by mouth 2 (two) times  daily.  Marland Kitchen estradiol (ESTRACE) 1 MG tablet Take 1 mg by mouth daily.  . fluticasone (FLONASE) 50 MCG/ACT nasal spray Place 1 spray into both nostrils daily as needed for allergies.   . furosemide (LASIX) 20 MG tablet Take 1 tablet (20 mg total) by mouth daily.  Marland Kitchen gabapentin (NEURONTIN) 300 MG capsule Take 300 mg by mouth daily. Take 3 PO daily  . gemfibrozil (LOPID) 600 MG tablet Take 600 mg by mouth 2 (two) times daily before a meal.    . ipratropium-albuterol (DUONEB) 0.5-2.5 (3) MG/3ML SOLN Take 3 mLs by nebulization every 4 (four)  hours as needed.  . isosorbide mononitrate (IMDUR) 30 MG 24 hr tablet Take 30 mg by mouth daily.  Marland Kitchen l-methylfolate-B6-B12 (METANX) 3-35-2 MG TABS Take 1 tablet by mouth daily.  Marland Kitchen loratadine (CLARITIN) 10 MG tablet Take 10 mg by mouth 2 (two) times daily.    Marland Kitchen LORazepam (ATIVAN) 0.5 MG tablet Take 0.5 mg by mouth 3 (three) times daily.   . magnesium hydroxide (MILK OF MAGNESIA) 400 MG/5ML suspension Take 15 mLs by mouth at bedtime.  . Melatonin (MELATONIN TR) 10 MG TBCR Take 10 mg by mouth at bedtime.  . montelukast (SINGULAIR) 10 MG tablet Take 10 mg by mouth daily.  . Multiple Vitamin (MULTIVITAMIN) capsule Take 1 capsule by mouth daily.    Marland Kitchen NITROSTAT 0.4 MG SL tablet Place 0.4 mg under the tongue every 15 (fifteen) minutes as needed for chest pain.   Marland Kitchen omeprazole (PRILOSEC) 40 MG capsule Take 1 capsule (40 mg total) by mouth daily.  Marland Kitchen oxyCODONE (OXY IR/ROXICODONE) 5 MG immediate release tablet   . polyethylene glycol (MIRALAX) 17 g packet Take 17 g by mouth daily.  . valACYclovir (VALTREX) 500 MG tablet Take 500 mg by mouth 2 (two) times daily.    . Vitamin D, Ergocalciferol, (DRISDOL) 50000 UNITS CAPS capsule Take 1 Units by mouth once a week.  Marland Kitchen XTAMPZA ER 18 MG C12A Take 1 capsule by mouth 2 (two) times daily.  . potassium chloride 20 MEQ TBCR Take 20 mEq by mouth daily.  . Tiotropium Bromide-Olodaterol (STIOLTO RESPIMAT) 2.5-2.5 MCG/ACT AERS Inhale 2 puffs  into the lungs daily.   No facility-administered encounter medications on file as of 04/28/2020.     Review of Systems  Review of Systems  Constitutional: Positive for fatigue. Negative for activity change and fever.  HENT: Positive for postnasal drip and rhinorrhea. Negative for congestion, sinus pressure, sinus pain and sore throat.   Respiratory: Positive for cough and shortness of breath. Negative for wheezing.   Cardiovascular: Negative for chest pain and palpitations.  Gastrointestinal: Negative for diarrhea, nausea and vomiting.  Musculoskeletal: Negative for arthralgias.  Neurological: Negative for dizziness.  Psychiatric/Behavioral: Negative for sleep disturbance. The patient is not nervous/anxious.      Physical Exam  BP 140/80 (BP Location: Left Arm, Cuff Size: Normal)   Pulse 97   Temp (!) 97.5 F (36.4 C) (Oral)   Ht 5' 9"  (1.753 m)   Wt 169 lb 14.4 oz (77.1 kg)   SpO2 97%   BMI 25.09 kg/m   Wt Readings from Last 5 Encounters:  04/28/20 169 lb 14.4 oz (77.1 kg)  01/31/20 169 lb 9.6 oz (76.9 kg)  01/02/20 180 lb (81.6 kg)  10/31/19 165 lb (74.8 kg)  06/07/19 160 lb (72.6 kg)    BMI Readings from Last 5 Encounters:  04/28/20 25.09 kg/m  01/31/20 25.79 kg/m  01/02/20 27.37 kg/m  10/31/19 25.09 kg/m  06/07/19 24.33 kg/m     Physical Exam Vitals and nursing note reviewed.  Constitutional:      General: She is not in acute distress.    Appearance: Normal appearance. She is normal weight.  HENT:     Head: Normocephalic and atraumatic.     Right Ear: External ear normal.     Left Ear: External ear normal.     Nose: Rhinorrhea present. No congestion.     Mouth/Throat:     Mouth: Mucous membranes are moist.     Pharynx: Oropharynx is clear.  Eyes:  Pupils: Pupils are equal, round, and reactive to light.  Cardiovascular:     Rate and Rhythm: Normal rate and regular rhythm.     Pulses: Normal pulses.     Heart sounds: Normal heart sounds. No  murmur heard.   Pulmonary:     Effort: Pulmonary effort is normal. No respiratory distress.     Breath sounds: No decreased air movement. Wheezing (Expiratory wheeze) present. No decreased breath sounds or rales.  Musculoskeletal:     Cervical back: Normal range of motion.  Skin:    General: Skin is warm and dry.     Capillary Refill: Capillary refill takes less than 2 seconds.  Neurological:     General: No focal deficit present.     Mental Status: She is alert and oriented to person, place, and time. Mental status is at baseline.     Gait: Gait normal.  Psychiatric:        Mood and Affect: Mood normal.        Behavior: Behavior normal.        Thought Content: Thought content normal.        Judgment: Judgment normal.       Assessment & Plan:   Chronic obstructive pulmonary disease ([place]) Plan: Walk today in office trial of Stiolto Respimat high-resolution CT chest ordered  Allergic rhinitis Plan: Continue daily antihistamine continue Singulair continue nasal saline rinses  Dyspnea Reviewed pulmonary function testing with patient  plan: We will order high-resolution CT chest trial of Stiolto Respimat walk today in office   Chest pain 1 episode of atypical chest pain during pulmonary function test relieved with nitroglycerin tablet and removal of brassiere by patient  plan: Encourage patient to continue to clinically monitor symptoms. If symptoms worsen she needs to seek emergent evaluation by either primary care or an urgent care    Return in about 3 months (around 07/28/2020), or if symptoms worsen or fail to improve, for Follow up with Dr. Lamonte Sakai.   Lauraine Rinne, NP 04/28/2020   This appointment required 32 minutes of patient care (this includes precharting, chart review, review of results, face-to-face care, etc.).

## 2020-04-28 NOTE — Assessment & Plan Note (Signed)
Reviewed pulmonary function testing with patient  plan: We will order high-resolution CT chest trial of Stiolto Respimat walk today in office

## 2020-04-28 NOTE — Assessment & Plan Note (Signed)
1 episode of atypical chest pain during pulmonary function test relieved with nitroglycerin tablet and removal of brassiere by patient  plan: Encourage patient to continue to clinically monitor symptoms. If symptoms worsen she needs to seek emergent evaluation by either primary care or an urgent care

## 2020-04-28 NOTE — Progress Notes (Signed)
Spirometry/DLCO performed today. 

## 2020-04-28 NOTE — Telephone Encounter (Signed)
Called and spoke patient, she stated that we had the wrong pharmacy on file for her.  Advised that her pharmacy is [facility] on [address].  [phone].  Nothing further needed.

## 2020-04-28 NOTE — Assessment & Plan Note (Signed)
Plan: Walk today in office trial of Stiolto Respimat high-resolution CT chest ordered

## 2020-04-28 NOTE — Assessment & Plan Note (Signed)
Plan: Continue daily antihistamine continue Singulair continue nasal saline rinses

## 2020-04-30 ENCOUNTER — Telehealth: Payer: Self-pay | Admitting: Emergency Medicine

## 2020-04-30 DIAGNOSIS — R0602 Shortness of breath: Secondary | ICD-10-CM

## 2020-04-30 NOTE — Telephone Encounter (Signed)
Spoke with the pt  She states unable to complete her full PFT due to having CP on 9/21  She is wondering if RB wants her to try and repeat this when she returns in 3 months  Please advise, thanks!

## 2020-05-01 NOTE — Telephone Encounter (Signed)
LMTCB- please reschedule PFT when she calls back for 3 months

## 2020-05-01 NOTE — Telephone Encounter (Signed)
Yes I'd like for her to get them then Thanks

## 2020-05-06 NOTE — Telephone Encounter (Signed)
Called and spoke with pt letting her know that [place] wanted her to repeat the PFT in 3 months when she comes for a f/u and she verbalized understanding. Asked her if she wanted to go ahead and schedule the PFT or get that scheduled same day once we knew RB's schedule and she stated she wanted to get it all scheduled at the same time. Recall has been placed for the PFT appt in 3 months. Nothing further needed.

## 2020-05-19 ENCOUNTER — Ambulatory Visit (HOSPITAL_COMMUNITY): Payer: Medicare Other

## 2020-05-27 ENCOUNTER — Other Ambulatory Visit: Payer: Self-pay

## 2020-05-27 ENCOUNTER — Emergency Department (HOSPITAL_COMMUNITY)
Admission: EM | Admit: 2020-05-27 | Discharge: 2020-05-27 | Disposition: A | Discharge status: Home / Self Care | Payer: Medicare Other | Attending: Emergency Medicine | Admitting: Emergency Medicine

## 2020-05-27 ENCOUNTER — Encounter (HOSPITAL_COMMUNITY): Payer: Self-pay | Admitting: *Deleted

## 2020-05-27 DIAGNOSIS — M79601 Pain in right arm: Secondary | ICD-10-CM | POA: Insufficient documentation

## 2020-05-27 DIAGNOSIS — Z5321 Procedure and treatment not carried out due to patient leaving prior to being seen by health care provider: Secondary | ICD-10-CM | POA: Diagnosis not present

## 2020-05-27 DIAGNOSIS — W1839XA Other fall on same level, initial encounter: Secondary | ICD-10-CM | POA: Insufficient documentation

## 2020-05-27 DIAGNOSIS — M25511 Pain in right shoulder: Secondary | ICD-10-CM | POA: Insufficient documentation

## 2020-05-27 NOTE — ED Triage Notes (Signed)
Pt states she fell this am at 5am; pt states fell backwards and is having pain to right arm and shoulder

## 2020-06-08 ENCOUNTER — Telehealth: Payer: Self-pay | Admitting: Emergency Medicine

## 2020-06-08 MED ORDER — STIOLTO RESPIMAT 2.5-2.5 MCG/ACT IN AERS: 2.0000 | INHALATION_SPRAY | Freq: Every day | RESPIRATORY_TRACT | 5 refills | Status: DC

## 2020-06-08 NOTE — Telephone Encounter (Signed)
Called and spoke with Patient.  Patient stated Stiolto sample worked great and requested a prescription be sent to [facility] in [place].   Requested stiolto prescription sent to requested pharmacy.  Nothing further at this time.

## 2020-06-11 ENCOUNTER — Ambulatory Visit (HOSPITAL_COMMUNITY): Payer: Medicare Other

## 2020-06-11 ENCOUNTER — Ambulatory Visit: Payer: Medicare Other | Admitting: Emergency Medicine

## 2020-06-22 ENCOUNTER — Ambulatory Visit (HOSPITAL_COMMUNITY): Payer: Medicare Other

## 2020-07-17 ENCOUNTER — Ambulatory Visit (HOSPITAL_COMMUNITY): Payer: Medicare Other

## 2020-07-21 ENCOUNTER — Encounter: Payer: Self-pay | Admitting: Adult Health

## 2020-07-21 ENCOUNTER — Ambulatory Visit: Payer: Medicare Other | Admitting: Emergency Medicine

## 2020-07-21 ENCOUNTER — Ambulatory Visit (INDEPENDENT_AMBULATORY_CARE_PROVIDER_SITE_OTHER): Payer: Medicare Other | Admitting: Adult Health

## 2020-07-21 ENCOUNTER — Telehealth: Payer: Self-pay | Admitting: Emergency Medicine

## 2020-07-21 DIAGNOSIS — J441 Chronic obstructive pulmonary disease with (acute) exacerbation: Secondary | ICD-10-CM

## 2020-07-21 MED ORDER — AMOXICILLIN-POT CLAVULANATE 875-125 MG PO TABS
1.0000 | ORAL_TABLET | Freq: Two times a day (BID) | ORAL | 0 refills | Status: DC
Start: 2020-07-21 — End: 2020-07-26

## 2020-07-21 NOTE — Telephone Encounter (Signed)
07/21/2020  Attempted to contact the patient.  Left voicemail for her to contact her office back.  Will route message as FYI to Dr. Lamonte Sakai and Estill Bamberg that patient would like to have a televisit in the afternoon.  We will leave in triage for 1 more additional attempt.   Wyn Quaker, FNP

## 2020-07-21 NOTE — Patient Instructions (Addendum)
Begin Augmentin 875 mg twice daily for 1 week, take with food Mucinex twice daily as needed for cough and congestion Delsym 2 teaspoons twice daily as needed for cough Fluids and rest Please get COVID-19 tested.  Please quarantine until your results are returned.  If your test results for COVID-19 are positive please call us back immediately. Advance activity as tolerated Continue on Stiolto 2 puffs daily, rinse after use Follow-up in the office in 3-4 weeks with Dr. Lamonte Sakai  And As needed   Please contact office for sooner follow up if symptoms do not improve or worsen or seek emergency care

## 2020-07-21 NOTE — Telephone Encounter (Signed)
07/21/20  Called and spoke with patient.  She reports she has a worsening cough as well as drainage.  She also has significant lower extremity swelling.  She is reporting she cannot drive to her office today.  She is spoken with primary care yesterday regarding her symptoms and they recommended that she have an emergency room evaluation if the lower extremity swelling does not go down.  She reports that her legs are "4 times the size".  She would like to convert today's visit with Dr. Lamonte Sakai to a televisit.  I have went ahead and coordinated this.  I have also scheduled the patient for a in office evaluation on 08/17/2020 to review the high-resolution CT chest in person that is ordered on 08/13/2020.  We'll route to Dr. Lamonte Sakai and Estill Bamberg as Juluis Rainier.  I have encouraged the patient to seek in person evaluation of her lower extremity swelling is a significant as she is stating.  She reports that if her swelling does not go down with the diuretics that she is taken this morning then she will seek ER eval in the afternoon.  She is aware to seek emergency room evaluation if shortness of breath develops.  Wyn Quaker, FNP

## 2020-07-21 NOTE — Telephone Encounter (Signed)
Pt returning call to see if she can change to a televisit - please advise [phone]

## 2020-07-21 NOTE — Progress Notes (Signed)
Virtual Visit via Telephone Note  I connected with Kayla Ryan on 07/21/20 at  3:30 PM EST by telephone and verified that I am speaking with the correct person using two identifiers.  Location: Patient: Home  Provider: Office    I discussed the limitations, risks, security and privacy concerns of performing an evaluation and management service by telephone and the availability of in person appointments. I also discussed with the patient that there may be a patient responsible charge related to this service. The patient expressed understanding and agreed to proceed.   History of Present Illness: 64 year old female former smoker followed for COPD, asthma, emphysema Medical history significant for Crohn's disease, inflammatory myopathy, fibromyalgia   Today's telemedicine visit is an acute office visit.  Patient complains over the last week that she has had increased cough congestion rattling mucus, postnasal drainage general malaise.  Patient has underlying COPD with asthma.  Remains on Stiolto daily.  She complains that she has had severe coughing fits that cause her throat to feel tight.  She denies any hemoptysis, fever, loss of taste or smell, nausea vomiting diarrhea.   Past Medical History:  Diagnosis Date  . Arthritis   . Asthma   . Chest pain   . Chronic bronchitis   . COPD (chronic obstructive pulmonary disease) ([place])   . Crohn disease ([place])   . Emphysema   . Emphysema of lung ([place])   . Fibromyalgia   . Herpes   . Hyperlipidemia   . IBS (irritable bowel syndrome)   . Migraines   . Muscular dystrophy ([place])   . Neck pain   . Plantar fasciitis   . Scoliosis     Current Outpatient Medications on File Prior to Visit  Medication Sig Dispense Refill  . albuterol (PROAIR HFA) 108 (90 Base) MCG/ACT inhaler Inhale 2 puffs into the lungs every 6 (six) hours as needed for wheezing or shortness of breath. 18 g 1  . aspirin 81 MG tablet Take 1 tablet (81 mg total) by mouth  daily.    . cetirizine (ZYRTEC) 10 MG tablet Take 10 mg by mouth daily.    . Coenzyme Q10 10 MG capsule Take 40 mg by mouth daily. 4 capsules po qd    . diclofenac sodium (VOLTAREN) 1 % GEL Apply 2 g topically 4 (four) times daily as needed (for pain). 5 Tube 1  . doxepin (SINEQUAN) 10 MG capsule Take 10 mg by mouth at bedtime.    . DULoxetine (CYMBALTA) 60 MG capsule Take 60 mg by mouth 2 (two) times daily.    Marland Kitchen estradiol (ESTRACE) 1 MG tablet Take 1 mg by mouth daily.    . fluticasone (FLONASE) 50 MCG/ACT nasal spray Place 1 spray into both nostrils daily as needed for allergies.     . furosemide (LASIX) 20 MG tablet Take 1 tablet (20 mg total) by mouth daily. 30 tablet 1  . gabapentin (NEURONTIN) 300 MG capsule Take 300 mg by mouth daily. Take 3 PO daily    . gemfibrozil (LOPID) 600 MG tablet Take 600 mg by mouth 2 (two) times daily before a meal.    . ipratropium-albuterol (DUONEB) 0.5-2.5 (3) MG/3ML SOLN Take 3 mLs by nebulization every 4 (four) hours as needed.    . isosorbide mononitrate (IMDUR) 30 MG 24 hr tablet Take 30 mg by mouth daily.    Marland Kitchen l-methylfolate-B6-B12 (METANX) 3-35-2 MG TABS Take 1 tablet by mouth daily.    Marland Kitchen loratadine (CLARITIN) 10 MG tablet  Take 10 mg by mouth 2 (two) times daily.      Marland Kitchen LORazepam (ATIVAN) 0.5 MG tablet Take 0.5 mg by mouth 3 (three) times daily.    . magnesium hydroxide (MILK OF MAGNESIA) 400 MG/5ML suspension Take 15 mLs by mouth at bedtime.    . Melatonin 10 MG TBCR Take 10 mg by mouth at bedtime.    . montelukast (SINGULAIR) 10 MG tablet Take 10 mg by mouth daily.    . Multiple Vitamin (MULTIVITAMIN) capsule Take 1 capsule by mouth daily.    Marland Kitchen NITROSTAT 0.4 MG SL tablet Place 0.4 mg under the tongue every 15 (fifteen) minutes as needed for chest pain.     Marland Kitchen omeprazole (PRILOSEC) 40 MG capsule Take 1 capsule (40 mg total) by mouth daily. 30 capsule 1  . oxyCODONE (OXY IR/ROXICODONE) 5 MG immediate release tablet     . polyethylene glycol (MIRALAX)  17 g packet Take 17 g by mouth daily. 28 each 0  . potassium chloride 20 MEQ TBCR Take 20 mEq by mouth daily. 30 tablet 0  . Tiotropium Bromide-Olodaterol (STIOLTO RESPIMAT) 2.5-2.5 MCG/ACT AERS Inhale 2 puffs into the lungs daily. 4 g 5  . valACYclovir (VALTREX) 500 MG tablet Take 500 mg by mouth 2 (two) times daily.    . Vitamin D, Ergocalciferol, (DRISDOL) 50000 UNITS CAPS capsule Take 1 Units by mouth once a week.    Marland Kitchen XTAMPZA ER 18 MG C12A Take 1 capsule by mouth 2 (two) times daily.     No current facility-administered medications on file prior to visit.      Observations/Objective: Speaks in full sentences with no audible distress. Positive cough phone exam  Assessment and Plan: Acute COPD exacerbation.  Needs COVID-19 testing Patient education given on quarantine  Plan  Patient Instructions  Begin Augmentin 875 mg twice daily for 1 week, take with food Mucinex twice daily as needed for cough and congestion Delsym 2 teaspoons twice daily as needed for cough Fluids and rest Please get COVID-19 tested.  Please quarantine until your results are returned.  If your test results for COVID-19 are positive please call us back immediately. Advance activity as tolerated Continue on Stiolto 2 puffs daily, rinse after use Follow-up in the office in 3-4 weeks with Dr. Lamonte Sakai  And As needed   Please contact office for sooner follow up if symptoms do not improve or worsen or seek emergency care       Follow Up Instructions: Follow-up in 3 to 4 weeks and as needed   I discussed the assessment and treatment plan with the patient. The patient was provided an opportunity to ask questions and all were answered. The patient agreed with the plan and demonstrated an understanding of the instructions.   The patient was advised to call back or seek an in-person evaluation if the symptoms worsen or if the condition fails to improve as anticipated.  I provided 24 minutes of non-face-to-face time  during this encounter.   Rexene Edison, NP

## 2020-07-23 ENCOUNTER — Telehealth: Payer: Self-pay | Admitting: Internal Medicine

## 2020-07-23 NOTE — Telephone Encounter (Signed)
Patient is going to call ambulance to go to emergency room. Patient phone number is [phone].

## 2020-07-23 NOTE — Telephone Encounter (Signed)
On call- reports cough is worse, scant sputum may be discolored, legs swelling, can't walk 4 steps. She says she refuses to go to [facility], but is calling ambulance to go tonight to [facility] ER for probable admission.

## 2020-07-23 NOTE — Telephone Encounter (Signed)
FYI for Dr Lamonte Sakai - Pt has decided to call ambulance to go to ER.

## 2020-07-24 ENCOUNTER — Emergency Department (HOSPITAL_COMMUNITY): Payer: Medicare Other

## 2020-07-24 ENCOUNTER — Other Ambulatory Visit: Payer: Self-pay

## 2020-07-24 ENCOUNTER — Encounter (HOSPITAL_COMMUNITY): Payer: Self-pay

## 2020-07-24 ENCOUNTER — Inpatient Hospital Stay (HOSPITAL_COMMUNITY)
Admission: EM | Admit: 2020-07-24 | Discharge: 2020-07-26 | DRG: 308 | Disposition: A | Discharge status: Home / Self Care | Payer: Medicare Other | Attending: Family Medicine | Admitting: Family Medicine

## 2020-07-24 DIAGNOSIS — J439 Emphysema, unspecified: Secondary | ICD-10-CM | POA: Diagnosis present

## 2020-07-24 DIAGNOSIS — Z882 Allergy status to sulfonamides status: Secondary | ICD-10-CM

## 2020-07-24 DIAGNOSIS — I10 Essential (primary) hypertension: Secondary | ICD-10-CM

## 2020-07-24 DIAGNOSIS — R0602 Shortness of breath: Secondary | ICD-10-CM

## 2020-07-24 DIAGNOSIS — Z825 Family history of asthma and other chronic lower respiratory diseases: Secondary | ICD-10-CM

## 2020-07-24 DIAGNOSIS — I4891 Unspecified atrial fibrillation: Secondary | ICD-10-CM | POA: Diagnosis not present

## 2020-07-24 DIAGNOSIS — E785 Hyperlipidemia, unspecified: Secondary | ICD-10-CM | POA: Diagnosis present

## 2020-07-24 DIAGNOSIS — D72829 Elevated white blood cell count, unspecified: Secondary | ICD-10-CM

## 2020-07-24 DIAGNOSIS — E876 Hypokalemia: Secondary | ICD-10-CM | POA: Diagnosis present

## 2020-07-24 DIAGNOSIS — Z888 Allergy status to other drugs, medicaments and biological substances status: Secondary | ICD-10-CM

## 2020-07-24 DIAGNOSIS — Z90710 Acquired absence of both cervix and uterus: Secondary | ICD-10-CM

## 2020-07-24 DIAGNOSIS — Z79899 Other long term (current) drug therapy: Secondary | ICD-10-CM

## 2020-07-24 DIAGNOSIS — E538 Deficiency of other specified B group vitamins: Secondary | ICD-10-CM | POA: Diagnosis present

## 2020-07-24 DIAGNOSIS — D649 Anemia, unspecified: Secondary | ICD-10-CM

## 2020-07-24 DIAGNOSIS — M797 Fibromyalgia: Secondary | ICD-10-CM | POA: Diagnosis present

## 2020-07-24 DIAGNOSIS — Z79891 Long term (current) use of opiate analgesic: Secondary | ICD-10-CM

## 2020-07-24 DIAGNOSIS — M3320 Polymyositis, organ involvement unspecified: Secondary | ICD-10-CM | POA: Diagnosis present

## 2020-07-24 DIAGNOSIS — Z881 Allergy status to other antibiotic agents status: Secondary | ICD-10-CM

## 2020-07-24 DIAGNOSIS — J449 Chronic obstructive pulmonary disease, unspecified: Secondary | ICD-10-CM | POA: Diagnosis present

## 2020-07-24 DIAGNOSIS — Z7951 Long term (current) use of inhaled steroids: Secondary | ICD-10-CM

## 2020-07-24 DIAGNOSIS — I5033 Acute on chronic diastolic (congestive) heart failure: Secondary | ICD-10-CM | POA: Diagnosis present

## 2020-07-24 DIAGNOSIS — Z8719 Personal history of other diseases of the digestive system: Secondary | ICD-10-CM

## 2020-07-24 DIAGNOSIS — J9601 Acute respiratory failure with hypoxia: Secondary | ICD-10-CM | POA: Diagnosis present

## 2020-07-24 DIAGNOSIS — I779 Disorder of arteries and arterioles, unspecified: Secondary | ICD-10-CM

## 2020-07-24 DIAGNOSIS — I110 Hypertensive heart disease with heart failure: Secondary | ICD-10-CM | POA: Diagnosis present

## 2020-07-24 DIAGNOSIS — G8929 Other chronic pain: Secondary | ICD-10-CM | POA: Diagnosis present

## 2020-07-24 DIAGNOSIS — Z20822 Contact with and (suspected) exposure to covid-19: Secondary | ICD-10-CM | POA: Diagnosis present

## 2020-07-24 DIAGNOSIS — I480 Paroxysmal atrial fibrillation: Secondary | ICD-10-CM | POA: Diagnosis not present

## 2020-07-24 DIAGNOSIS — J189 Pneumonia, unspecified organism: Secondary | ICD-10-CM | POA: Diagnosis present

## 2020-07-24 DIAGNOSIS — Z87891 Personal history of nicotine dependence: Secondary | ICD-10-CM

## 2020-07-24 DIAGNOSIS — G7100 Muscular dystrophy, unspecified: Secondary | ICD-10-CM | POA: Diagnosis present

## 2020-07-24 DIAGNOSIS — D529 Folate deficiency anemia, unspecified: Secondary | ICD-10-CM | POA: Diagnosis present

## 2020-07-24 DIAGNOSIS — K9089 Other intestinal malabsorption: Secondary | ICD-10-CM | POA: Diagnosis present

## 2020-07-24 DIAGNOSIS — E039 Hypothyroidism, unspecified: Secondary | ICD-10-CM | POA: Diagnosis present

## 2020-07-24 DIAGNOSIS — I5030 Unspecified diastolic (congestive) heart failure: Secondary | ICD-10-CM

## 2020-07-24 DIAGNOSIS — Z885 Allergy status to narcotic agent status: Secondary | ICD-10-CM

## 2020-07-24 DIAGNOSIS — Z807 Family history of other malignant neoplasms of lymphoid, hematopoietic and related tissues: Secondary | ICD-10-CM

## 2020-07-24 DIAGNOSIS — Z801 Family history of malignant neoplasm of trachea, bronchus and lung: Secondary | ICD-10-CM

## 2020-07-24 DIAGNOSIS — I509 Heart failure, unspecified: Secondary | ICD-10-CM

## 2020-07-24 DIAGNOSIS — Z9010 Acquired absence of unspecified breast and nipple: Secondary | ICD-10-CM

## 2020-07-24 DIAGNOSIS — R918 Other nonspecific abnormal finding of lung field: Secondary | ICD-10-CM

## 2020-07-24 HISTORY — DX: Polymyositis, organ involvement unspecified: M33.20

## 2020-07-24 LAB — BASIC METABOLIC PANEL
Anion gap: 10 (ref 5–15)
BUN: 15 mg/dL (ref 8–23)
CO2: 21 mmol/L — ABNORMAL LOW (ref 22–32)
Calcium: 8.8 mg/dL — ABNORMAL LOW (ref 8.9–10.3)
Chloride: 104 mmol/L (ref 98–111)
Creatinine, Ser: 0.98 mg/dL (ref 0.44–1.00)
GFR, Estimated: >60 mL/min (ref >60)
Glucose, Bld: 136 mg/dL — ABNORMAL HIGH (ref 70–99)
Potassium: 3.7 mmol/L (ref 3.5–5.1)
Sodium: 135 mmol/L (ref 135–145)

## 2020-07-24 LAB — HEPATIC FUNCTION PANEL
ALT: 34 U/L (ref 0–44)
AST: 47 U/L — ABNORMAL HIGH (ref 15–41)
Albumin: 4.1 g/dL (ref 3.5–5.0)
Alkaline Phosphatase: 73 U/L (ref 38–126)
Bilirubin, Direct: 0.1 mg/dL (ref 0.0–0.2)
Indirect Bilirubin: 0.6 mg/dL (ref 0.3–0.9)
Total Bilirubin: 0.7 mg/dL (ref 0.3–1.2)
Total Protein: 7.7 g/dL (ref 6.5–8.1)

## 2020-07-24 LAB — CBC
HCT: 23.5 % — ABNORMAL LOW (ref 36.0–46.0)
Hemoglobin: 6.5 g/dL — CL (ref 12.0–15.0)
MCH: 22 pg — ABNORMAL LOW (ref 26.0–34.0)
MCHC: 27.7 g/dL — ABNORMAL LOW (ref 30.0–36.0)
MCV: 79.7 fL — ABNORMAL LOW (ref 80.0–100.0)
Platelets: 471 10*3/uL — ABNORMAL HIGH (ref 150–400)
RBC: 2.95 MIL/uL — ABNORMAL LOW (ref 3.87–5.11)
RDW: 18.2 % — ABNORMAL HIGH (ref 11.5–15.5)
WBC: 16.2 10*3/uL — ABNORMAL HIGH (ref 4.0–10.5)
nRBC: 0.1 % (ref 0.0–0.2)

## 2020-07-24 LAB — RESP PANEL BY RT-PCR (FLU A&B, COVID) ARPGX2
Influenza A by PCR: NEGATIVE
Influenza B by PCR: NEGATIVE
SARS Coronavirus 2 by RT PCR: NEGATIVE

## 2020-07-24 LAB — BRAIN NATRIURETIC PEPTIDE: B Natriuretic Peptide: 354 pg/mL — ABNORMAL HIGH (ref 0.0–100.0)

## 2020-07-24 LAB — POC OCCULT BLOOD, ED: Fecal Occult Bld: NEGATIVE

## 2020-07-24 LAB — PREPARE RBC (CROSSMATCH)

## 2020-07-24 MED ORDER — FUROSEMIDE 10 MG/ML IJ SOLN
40.0000 mg | Freq: Once | INTRAMUSCULAR | Status: AC
  Administered 2020-07-24: 13:00:00 40 mg via INTRAMUSCULAR
  Filled 2020-07-24: qty 4

## 2020-07-24 MED ORDER — OXYCODONE HCL ER 20 MG PO T12A
20.0000 mg | EXTENDED_RELEASE_TABLET | Freq: Two times a day (BID) | ORAL | Status: DC
  Administered 2020-07-24 – 2020-07-25 (×3): 20 mg via ORAL
  Filled 2020-07-24 (×4): qty 1

## 2020-07-24 MED ORDER — GEMFIBROZIL 600 MG PO TABS
600.0000 mg | ORAL_TABLET | Freq: Two times a day (BID) | ORAL | Status: DC
  Administered 2020-07-25 – 2020-07-26 (×3): 600 mg via ORAL
  Filled 2020-07-24 (×10): qty 1

## 2020-07-24 MED ORDER — POTASSIUM CHLORIDE CRYS ER 20 MEQ PO TBCR
40.0000 meq | EXTENDED_RELEASE_TABLET | Freq: Two times a day (BID) | ORAL | Status: DC
  Administered 2020-07-24 – 2020-07-26 (×4): 40 meq via ORAL
  Filled 2020-07-24 (×4): qty 2

## 2020-07-24 MED ORDER — TOPIRAMATE 100 MG PO TABS
100.0000 mg | ORAL_TABLET | Freq: Two times a day (BID) | ORAL | Status: DC
  Administered 2020-07-24 – 2020-07-26 (×4): 100 mg via ORAL
  Filled 2020-07-24 (×4): qty 1

## 2020-07-24 MED ORDER — ALBUTEROL SULFATE HFA 108 (90 BASE) MCG/ACT IN AERS
2.0000 | INHALATION_SPRAY | Freq: Once | RESPIRATORY_TRACT | Status: AC
  Administered 2020-07-24: 2 via RESPIRATORY_TRACT
  Filled 2020-07-24: qty 6.7

## 2020-07-24 MED ORDER — IOHEXOL 350 MG/ML SOLN
100.0000 mL | Freq: Once | INTRAVENOUS | Status: AC | PRN
  Administered 2020-07-24: 15:00:00 100 mL via INTRAVENOUS

## 2020-07-24 MED ORDER — GABAPENTIN 300 MG PO CAPS
300.0000 mg | ORAL_CAPSULE | Freq: Three times a day (TID) | ORAL | Status: DC
  Administered 2020-07-24 – 2020-07-26 (×5): 300 mg via ORAL
  Filled 2020-07-24 (×5): qty 1

## 2020-07-24 MED ORDER — LISINOPRIL 10 MG PO TABS
20.0000 mg | ORAL_TABLET | Freq: Every day | ORAL | Status: DC
  Administered 2020-07-25 – 2020-07-26 (×2): 20 mg via ORAL
  Filled 2020-07-24 (×2): qty 2

## 2020-07-24 MED ORDER — SODIUM CHLORIDE 0.9 % IV SOLN
250.0000 mL | INTRAVENOUS | Status: DC | PRN
Start: 2020-07-24 — End: 2020-07-26

## 2020-07-24 MED ORDER — FUROSEMIDE 40 MG PO TABS
40.0000 mg | ORAL_TABLET | Freq: Two times a day (BID) | ORAL | Status: DC
  Administered 2020-07-25 – 2020-07-26 (×3): 40 mg via ORAL
  Filled 2020-07-24 (×3): qty 1

## 2020-07-24 MED ORDER — SODIUM CHLORIDE 0.9 % IV SOLN: 10.0000 mL/h | Freq: Once | INTRAVENOUS | Status: DC

## 2020-07-24 MED ORDER — VALACYCLOVIR HCL 500 MG PO TABS
500.0000 mg | ORAL_TABLET | Freq: Two times a day (BID) | ORAL | Status: DC
  Administered 2020-07-24 – 2020-07-26 (×4): 500 mg via ORAL
  Filled 2020-07-24 (×4): qty 1

## 2020-07-24 MED ORDER — SODIUM CHLORIDE 0.9% FLUSH: 3.0000 mL | INTRAVENOUS | Status: DC | PRN

## 2020-07-24 MED ORDER — OXYCODONE HCL 5 MG PO TABS
5.0000 mg | ORAL_TABLET | Freq: Four times a day (QID) | ORAL | Status: DC | PRN
  Administered 2020-07-26: 5 mg via ORAL
  Filled 2020-07-24 (×3): qty 1

## 2020-07-24 MED ORDER — IPRATROPIUM-ALBUTEROL 0.5-2.5 (3) MG/3ML IN SOLN: 3.0000 mL | RESPIRATORY_TRACT | Status: DC | PRN

## 2020-07-24 MED ORDER — ACETAMINOPHEN 325 MG PO TABS: 650.0000 mg | ORAL_TABLET | ORAL | Status: DC | PRN

## 2020-07-24 MED ORDER — ALPRAZOLAM 0.25 MG PO TABS
0.2500 mg | ORAL_TABLET | Freq: Once | ORAL | Status: AC
  Administered 2020-07-24: 22:00:00 0.25 mg via ORAL
  Filled 2020-07-24: qty 1

## 2020-07-24 MED ORDER — ISOSORBIDE MONONITRATE ER 60 MG PO TB24
30.0000 mg | ORAL_TABLET | Freq: Every day | ORAL | Status: DC
  Administered 2020-07-25 – 2020-07-26 (×2): 30 mg via ORAL
  Filled 2020-07-24 (×5): qty 1

## 2020-07-24 MED ORDER — DOXEPIN HCL 10 MG PO CAPS
10.0000 mg | ORAL_CAPSULE | Freq: Every day | ORAL | Status: DC
  Administered 2020-07-24 – 2020-07-25 (×2): 10 mg via ORAL
  Filled 2020-07-24 (×4): qty 1

## 2020-07-24 MED ORDER — PANTOPRAZOLE SODIUM 40 MG PO TBEC
40.0000 mg | DELAYED_RELEASE_TABLET | Freq: Every day | ORAL | Status: DC
  Administered 2020-07-25 – 2020-07-26 (×2): 40 mg via ORAL
  Filled 2020-07-24 (×2): qty 1

## 2020-07-24 MED ORDER — SODIUM CHLORIDE 0.9% FLUSH
3.0000 mL | Freq: Two times a day (BID) | INTRAVENOUS | Status: DC
  Administered 2020-07-24 – 2020-07-26 (×3): 3 mL via INTRAVENOUS

## 2020-07-24 MED ORDER — OXYCODONE HCL 5 MG PO TABS
5.0000 mg | ORAL_TABLET | Freq: Once | ORAL | Status: AC
Start: 2020-07-24 — End: 2020-07-24
  Administered 2020-07-24: 12:00:00 5 mg via ORAL
  Filled 2020-07-24: qty 1

## 2020-07-24 MED ORDER — ONDANSETRON HCL 4 MG/2ML IJ SOLN: 4.0000 mg | Freq: Four times a day (QID) | INTRAMUSCULAR | Status: DC | PRN

## 2020-07-24 MED ORDER — LORATADINE 10 MG PO TABS
10.0000 mg | ORAL_TABLET | Freq: Every day | ORAL | Status: DC
  Administered 2020-07-25 – 2020-07-26 (×2): 10 mg via ORAL
  Filled 2020-07-24 (×2): qty 1

## 2020-07-24 MED ORDER — SODIUM CHLORIDE 0.9 % IV SOLN
10.0000 mL/h | Freq: Once | INTRAVENOUS | Status: AC
  Administered 2020-07-24: 10 mL/h via INTRAVENOUS

## 2020-07-24 MED ORDER — AMOXICILLIN-POT CLAVULANATE 875-125 MG PO TABS
1.0000 | ORAL_TABLET | Freq: Two times a day (BID) | ORAL | Status: DC
  Filled 2020-07-24 (×3): qty 1

## 2020-07-24 MED ORDER — OXYCODONE ER 18 MG PO C12A: 1.0000 | EXTENDED_RELEASE_CAPSULE | Freq: Two times a day (BID) | ORAL | Status: DC

## 2020-07-24 MED ORDER — ALBUTEROL SULFATE HFA 108 (90 BASE) MCG/ACT IN AERS: 2.0000 | INHALATION_SPRAY | Freq: Four times a day (QID) | RESPIRATORY_TRACT | Status: DC | PRN

## 2020-07-24 NOTE — ED Triage Notes (Addendum)
Pt has been short of breath for 2 weeks. Pt has been coughing. Pt reports lungs hurt and feel full. EMS reports sats 96% on room air but placed on O2. See note from provider on 07/21/20

## 2020-07-24 NOTE — ED Notes (Signed)
CRITICAL VALUE ALERT  Critical Value:     Hgb 6.5  /Date & Time Notied:  07/24/2020 1149  Provider Notified: Dr. Rogene Houston  Orders Received/Actions taken: see chart

## 2020-07-24 NOTE — ED Provider Notes (Signed)
[facility] EMERGENCY DEPARTMENT Provider Note   CSN: [id number] Arrival date & time: 07/24/20  1013     History Chief Complaint  Patient presents with  . Shortness of Breath    Kayla Ryan is a 64 y.o. female.  Patient presenting with multiple complaints.  Stating she has been short of breath for 2 weeks.  Is been coughing reports that her lungs hurt and feel full.  EMS reported that oxygen sats were 96% on room air but room air but they placed her on 2 L of oxygen.  Patient felt better on that.  Patient also told me she is having abdominal pain and that she has had blood in her bowel movements.  She is required blood transfusions before but she does not know where the blood had come from.  Patient's past medical history is significant for fibromyalgia Crohn's disease irritable bowel syndrome.  COPD.  Muscular dystrophy emphysema.  Polymyositis.  Patient had a left heart catheterization in 2015.  Patient was admitted with anemia and may of 2021.        Past Medical History:  Diagnosis Date  . Arthritis   . Asthma   . Chest pain   . Chronic bronchitis   . COPD (chronic obstructive pulmonary disease) ([place])   . Crohn disease ([place])   . Emphysema   . Emphysema of lung ([place])   . Fibromyalgia   . Herpes   . Hyperlipidemia   . IBS (irritable bowel syndrome)   . Migraines   . Muscular dystrophy ([place])   . Neck pain   . Plantar fasciitis   . Polymyositis ([place])   . Scoliosis     Patient Active Problem List   Diagnosis Date Noted  . Dyspnea 01/31/2020  . Anemia 01/03/2020  . Syncope 06/08/2019  . Hypokalemia 06/08/2019  . Trigger finger, acquired 11/03/2017  . Chronic obstructive pulmonary disease ([place]) 08/23/2017  . Chronic pain 08/23/2017  . History of Crohn's disease 08/23/2017  . Myopathy 08/23/2017  . Right shoulder injury, subsequent encounter 08/23/2017  . Primary insomnia 02/01/2017  . Esophageal dysphagia 10/06/2016  . Essential hypertension 07/17/2016  .  Iron deficiency anemia 03/25/2016  . History of Bell's palsy 03/03/2015  . Nuclear sclerosis of both eyes 03/03/2015  . Allergic rhinitis 05/18/2014  . Anxiety 05/18/2014  . Arthritis 05/18/2014  . Asthma 05/18/2014  . Chronic constipation 05/18/2014  . Dermatomyositis ([place]) 05/18/2014  . Migraine without status migrainosus, not intractable 05/18/2014  . Rectocele 05/18/2014  . 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 Peripheral neuropathy 05/18/2014  . Chest pain 10/30/2013  . Hyperlipidemia 10/30/2013  . Carotid artery disease ([place]) 10/30/2013    Past Surgical History:  Procedure Laterality Date  . ABDOMINAL HYSTERECTOMY    . bone spur    . CHOLECYSTECTOMY    . HERNIA REPAIR    . LEFT HEART CATHETERIZATION WITH CORONARY ANGIOGRAM N/A 11/07/2013   Procedure: LEFT HEART CATHETERIZATION WITH CORONARY ANGIOGRAM;  Surgeon: Lorretta Harp, MD;  Location: [facility] CATH LAB;  Service: Cardiovascular;  Laterality: N/A;  . MASTECTOMY PARTIAL / LUMPECTOMY    . OOPHORECTOMY    . ROTATOR CUFF REPAIR       OB History   No obstetric history on file.     Family History  Problem Relation Age of Onset  . Lung cancer Mother   . COPD Father   . Lung cancer Father   . Lymphoma Father     Social History  Tobacco Use  . Smoking status: Former Smoker    Packs/day: 1.00    Years: 49.00    Pack years: 49.00    Types: Cigarettes    Start date: 08/08/1966    Quit date: 07/03/2011    Years since quitting: 9.0  . Smokeless tobacco: Never Used  . Tobacco comment: Originally quit 2007 for 5 years then restarted in 2012 for 3 months then quit  Vaping Use  . Vaping Use: Never used  Substance Use Topics  . Alcohol use: No  . Drug use: No    Home Medications Prior to Admission medications   Medication Sig Start Date End Date Taking? Authorizing Provider  amoxicillin-clavulanate (AUGMENTIN) 875-125 MG tablet Take 1 tablet by mouth 2 (two) times daily for 7 days. 07/21/20  07/28/20 Yes Parrett, Tammy S, NP  cetirizine (ZYRTEC) 10 MG tablet Take 10 mg by mouth daily.   Yes [provider]  Coenzyme Q10 10 MG capsule Take 40 mg by mouth daily. 4 capsules po qd   Yes [provider]  diclofenac sodium (VOLTAREN) 1 % GEL Apply 2 g topically 4 (four) times daily as needed (for pain). 08/21/17  Yes Hudnall, Sharyn Lull, MD  doxepin (SINEQUAN) 10 MG capsule Take 10 mg by mouth at bedtime. 10/11/13  Yes [provider]  gabapentin (NEURONTIN) 300 MG capsule Take 300 mg by mouth 3 (three) times daily.   Yes [provider]  gemfibrozil (LOPID) 600 MG tablet Take 600 mg by mouth 2 (two) times daily before a meal.   Yes [provider]  ipratropium-albuterol (DUONEB) 0.5-2.5 (3) MG/3ML SOLN Take 3 mLs by nebulization every 4 (four) hours as needed.   Yes [provider]  isosorbide mononitrate (IMDUR) 30 MG 24 hr tablet Take 30 mg by mouth daily. 10/06/13  Yes [provider]  magnesium hydroxide (MILK OF MAGNESIA) 400 MG/5ML suspension Take 15 mLs by mouth at bedtime.   Yes [provider]  Melatonin 10 MG TBCR Take 60 mg by mouth at bedtime.   Yes [provider]  Multiple Vitamin (MULTIVITAMIN) capsule Take 1 capsule by mouth daily.   Yes [provider]  omeprazole (PRILOSEC) 40 MG capsule Take 1 capsule (40 mg total) by mouth daily. 01/03/20  Yes Shahmehdi, Valeria Batman, MD  oxyCODONE (OXY IR/ROXICODONE) 5 MG immediate release tablet  05/29/19  Yes [provider]  Tiotropium Bromide-Olodaterol (STIOLTO RESPIMAT) 2.5-2.5 MCG/ACT AERS Inhale 2 puffs into the lungs daily. 06/08/20  Yes Lauraine Rinne, NP  topiramate (TOPAMAX) 100 MG tablet Take 100 mg by mouth 2 (two) times daily. 04/28/20  Yes [provider]  valACYclovir (VALTREX) 500 MG tablet Take 500 mg by mouth 2 (two) times daily.   Yes [provider]  Vitamin D, Ergocalciferol, (DRISDOL) 50000 UNITS CAPS capsule Take 1  Units by mouth every 7 (seven) days. 10/18/13  Yes [provider]  XTAMPZA ER 18 MG C12A Take 1 capsule by mouth 2 (two) times daily. 05/29/19  Yes [provider]  albuterol (PROAIR HFA) 108 (90 Base) MCG/ACT inhaler Inhale 2 puffs into the lungs every 6 (six) hours as needed for wheezing or shortness of breath. 06/09/19   Barton Dubois, MD  DULoxetine (CYMBALTA) 60 MG capsule Take 60 mg by mouth 2 (two) times daily. Patient not taking: Reported on 07/24/2020 10/11/13   [provider]  NITROSTAT 0.4 MG SL tablet Place 0.4 mg under the tongue every 15 (fifteen) minutes as needed  for chest pain.  09/22/13   [provider]    Allergies    Sulfa antibiotics, Dilaudid [hydromorphone hcl], Metronidazole, Prednisone, Cephalexin, Methotrexate derivatives, and Morphine and related  Review of Systems   Review of Systems  Constitutional: Positive for fatigue. Negative for chills and fever.  HENT: Negative for rhinorrhea and sore throat.   Eyes: Negative for visual disturbance.  Respiratory: Positive for cough and shortness of breath.   Cardiovascular: Negative for chest pain and leg swelling.  Gastrointestinal: Positive for abdominal pain and blood in stool. Negative for diarrhea, nausea and vomiting.  Genitourinary: Negative for dysuria.  Musculoskeletal: Negative for back pain and neck pain.  Skin: Negative for rash.  Neurological: Negative for dizziness, light-headedness and headaches.  Hematological: Does not bruise/bleed easily.  Psychiatric/Behavioral: Negative for confusion.    Physical Exam Updated Vital Signs BP (!) 148/61   Pulse 95   Temp 98.5 F (36.9 C) (Oral)   Resp (!) 22   Ht 1.753 m (5' 9" )   Wt 78 kg   SpO2 100%   BMI 25.40 kg/m   Physical Exam Vitals and nursing note reviewed.  Constitutional:      General: She is in acute distress.     Appearance: Normal appearance. She is well-developed and well-nourished.  HENT:     Head:  Normocephalic and atraumatic.  Eyes:     Extraocular Movements: Extraocular movements intact.     Conjunctiva/sclera: Conjunctivae normal.     Pupils: Pupils are equal, round, and reactive to light.  Cardiovascular:     Rate and Rhythm: Normal rate and regular rhythm.     Heart sounds: No murmur heard.   Pulmonary:     Effort: Pulmonary effort is normal. No respiratory distress.     Breath sounds: Normal breath sounds.  Abdominal:     General: There is distension.     Palpations: Abdomen is soft.     Tenderness: There is no abdominal tenderness.  Genitourinary:    Rectum: Guaiac result negative.     Comments: Stool normal color Musculoskeletal:        General: No swelling or edema.     Cervical back: Neck supple.  Skin:    General: Skin is warm and dry.  Neurological:     General: No focal deficit present.     Mental Status: She is alert and oriented to person, place, and time.     Cranial Nerves: No cranial nerve deficit.     Sensory: No sensory deficit.     Motor: No weakness.     Coordination: Coordination normal.  Psychiatric:        Mood and Affect: Mood and affect normal.     ED Results / Procedures / Treatments   Labs (all labs ordered are listed, but only abnormal results are displayed) Labs Reviewed  BASIC METABOLIC PANEL - Abnormal; Notable for the following components:      Result Value   CO2 21 (*)    Glucose, Bld 136 (*)    Calcium 8.8 (*)    All other components within normal limits  CBC - Abnormal; Notable for the following components:   WBC 16.2 (*)    RBC 2.95 (*)    Hemoglobin 6.5 (*)    HCT 23.5 (*)    MCV 79.7 (*)    MCH 22.0 (*)    MCHC 27.7 (*)    RDW 18.2 (*)    Platelets 471 (*)    All other  components within normal limits  HEPATIC FUNCTION PANEL - Abnormal; Notable for the following components:   AST 47 (*)    All other components within normal limits  BRAIN NATRIURETIC PEPTIDE - Abnormal; Notable for the following components:   B  Natriuretic Peptide 354.0 (*)    All other components within normal limits  RESP PANEL BY RT-PCR (FLU A&B, COVID) ARPGX2  POC OCCULT BLOOD, ED  TYPE AND SCREEN  PREPARE RBC (CROSSMATCH)    EKG EKG Interpretation  Date/Time:  Friday July 24 2020 11:16:06 EST Ventricular Rate:  110 PR Interval:    QRS Duration: 111 QT Interval:  371 QTC Calculation: 502 R Axis:   71 Text Interpretation: Sinus tachycardia Minimal ST depression, lateral leads Prolonged QT interval Confirmed by Fredia Sorrow [phone]) on 07/24/2020 11:30:13 AM   Radiology CT Angio Chest PE W/Cm &/Or Wo Cm  Result Date: 07/24/2020 CLINICAL DATA:  Cough and shortness of breath for 2 weeks. EXAM: CT ANGIOGRAPHY CHEST WITH CONTRAST TECHNIQUE: Multidetector CT imaging of the chest was performed using the standard protocol during bolus administration of intravenous contrast. Multiplanar CT image reconstructions and MIPs were obtained to evaluate the vascular anatomy. CONTRAST:  185m OMNIPAQUE IOHEXOL 350 MG/ML SOLN COMPARISON:  Chest radiograph earlier today. FINDINGS: Cardiovascular: There are no filling defects within the pulmonary arteries to suggest pulmonary embolus. No aortic dissection or acute aortic abnormality upper normal heart size. No pericardial effusion. Cardiac motion limits assessment for coronary artery calcifications. Mediastinum/Nodes: Small bilateral hilar lymph nodes are not enlarged by size criteria. Occasional small mediastinal nodes. There is an 11 mm right thyroid nodule. Not clinically significant; no follow-up imaging recommended (ref: J Am Coll Radiol. 2015 Feb;12(2): 143-50).No esophageal wall thickening. Lungs/Pleura: Moderate emphysema. There is mild smooth septal thickening at the bases suggesting pulmonary edema. No significant pleural fluid. The subsegmental atelectasis or scarring in the lingula. In the posterior right upper lobe there is a 7 x 9 mm nodule (8 mm mean diameter) on image 52 of  series 8. There also a small subpleural nodules in the right lower lobe, series 8, image 123. Upper Abdomen: Assessed on concurrent abdominal CT, reported separately. Musculoskeletal: Thoracic spondylosis. There are no acute or suspicious osseous abnormalities. Review of the MIP images confirms the above findings. IMPRESSION: 1. No pulmonary embolus. 2. Smooth septal thickening suggestive of pulmonary edema. Upper normal heart size. 3. Moderate emphysema. 4. Right upper lobe pulmonary nodule measuring 8 mm mean diameter, with additional small subpleural nodules in the right lower lobe. Non-contrast chest CT at 3-6 months is recommended. If the nodules are stable at time of repeat CT, then future CT at 18-24 months (from today's scan) is considered optional for low-risk patients, but is recommended for high-risk patients. This recommendation follows the consensus statement: Guidelines for Management of Incidental Pulmonary Nodules Detected on CT Images: From the Fleischner Society 2017; Radiology 2017; 284:228-243. Aortic Atherosclerosis (ICD10-I70.0) and Emphysema (ICD10-J43.9). Electronically Signed   By: MKeith RakeM.D.   On: 07/24/2020 16:01   CT Abdomen Pelvis W Contrast  Result Date: 07/24/2020 CLINICAL DATA:  Acute abdominal pain.  Nonlocalized GI bleed. EXAM: CT ABDOMEN AND PELVIS WITH CONTRAST TECHNIQUE: Multidetector CT imaging of the abdomen and pelvis was performed using the standard protocol following bolus administration of intravenous contrast. CONTRAST:  1058mOMNIPAQUE IOHEXOL 350 MG/ML SOLN COMPARISON:  Most recent abdominal CT 08/13/2018 FINDINGS: Lower chest: Assessed on concurrent chest CT, reported separately. Septal thickening at the bases. Hepatobiliary: Focal fatty infiltration  adjacent to the falciform ligament. No suspicious lesion. Clips in the gallbladder fossa postcholecystectomy. No biliary dilatation. Pancreas: No ductal dilatation or inflammation. Spleen: Normal in size  without focal abnormality. Adrenals/Urinary Tract: Normal adrenal glands. No hydronephrosis or perinephric edema. Chronic extrarenal pelvis configuration of the left kidney. Homogeneous renal enhancement with symmetric excretion on delayed phase imaging. Tiny cortical hypodensity in the lower left kidney is too small to accurately characterize, not significantly changed from prior. Urinary bladder is distended without wall thickening. Stomach/Bowel: The stomach is decompressed. Normal positioning of the duodenum and ligament of Treitz. Short segment of prominent small bowel that are fluid-filled with air-fluid levels in the left upper quadrant. No associated bowel wall thickening or pneumatosis. No mesenteric edema. Remaining small bowel is decompressed. Appendix tentatively visualized and normal, for example series 2, image 55. mild colonic tortuosity with small volume of colonic stool. There is no colonic wall thickening or inflammation. Distal descending and sigmoid diverticulosis without diverticulitis. Vascular/Lymphatic: Moderate aortic and branch atherosclerosis. Mesenteric vessels are patent. The portal vein is patent. There is no mesenteric or portal venous gas. No enlarged lymph nodes in the abdomen or pelvis. Reproductive: Status post hysterectomy. No adnexal masses. Other: Small fat containing umbilical hernia. No ascites. No free air. Mild dependent subcutaneous soft tissue edema. Musculoskeletal: There are no acute or suspicious osseous abnormalities. There are transverse process fractures on the right at L1 through L3, and on the left at L3, but are remote. IMPRESSION: 1. Short segment of prominent fluid-filled small bowel with air-fluid levels in the left upper quadrant, may represent enteritis or focal ileus. 2. Distal colonic diverticulosis without diverticulitis. 3. Distended urinary bladder without wall thickening. Aortic Atherosclerosis (ICD10-I70.0). Electronically Signed   By: Keith Rake  M.D.   On: 07/24/2020 16:09   DG Chest Port 1 View  Result Date: 07/24/2020 CLINICAL DATA:  Cough and shortness of breath for the past few weeks. EXAM: PORTABLE CHEST 1 VIEW COMPARISON:  Chest x-ray dated Jan 02, 2020. FINDINGS: The heart size and mediastinal contours are within normal limits. New diffuse interstitial thickening with Kerley B-lines at the lung bases. No focal consolidation, pleural effusion, or pneumothorax. No acute osseous abnormality. IMPRESSION: 1. Interstitial pulmonary edema. Electronically Signed   By: Titus Dubin M.D.   On: 07/24/2020 11:28    Procedures Procedures (including critical care time)  CRITICAL CARE Performed by: Fredia Sorrow Total critical care time: 45 minutes Critical care time was exclusive of separately billable procedures and treating other patients. Critical care was necessary to treat or prevent imminent or life-threatening deterioration. Critical care was time spent personally by me on the following activities: development of treatment plan with patient and/or surrogate as well as nursing, discussions with consultants, evaluation of patient's response to treatment, examination of patient, obtaining history from patient or surrogate, ordering and performing treatments and interventions, ordering and review of laboratory studies, ordering and review of radiographic studies, pulse oximetry and re-evaluation of patient's condition.   Medications Ordered in ED Medications  albuterol (VENTOLIN HFA) 108 (90 Base) MCG/ACT inhaler 2 puff (2 puffs Inhalation Given 07/24/20 1220)  0.9 %  sodium chloride infusion (0 mL/hr Intravenous Stopped 07/24/20 1608)  furosemide (LASIX) injection 40 mg (40 mg Intramuscular Given 07/24/20 1230)  oxyCODONE (Oxy IR/ROXICODONE) immediate release tablet 5 mg (5 mg Oral Given 07/24/20 1229)  iohexol (OMNIPAQUE) 350 MG/ML injection 100 mL (100 mLs Intravenous Contrast Given 07/24/20 1450)    ED Course  I have  reviewed the triage  vital signs and the nursing notes.  Pertinent labs & imaging results that were available during my care of the patient were reviewed by me and considered in my medical decision making (see chart for details).    MDM Rules/Calculators/A&P                          Patient's work-up early on we noticed that she had significant anemia with a hemoglobin of 6.5.  Stool Hemoccult negative despite despite her history of blood per rectum for the past 2 weeks.  CT abdomen without any acute findings may be a little bit of ileus.  CT angio chest was done for the persistent shortness of breath.  Shows a little bit of pulmonary edema some nodules but no other acute findings no pulmonary embolus.  Patient to be transfused 2 units of blood to start with.  First unit is gone and she did receive Lasix with the first.  Her BNP is elevated from her baseline in the 300 range.  May need some follow-up Lasix.  Discussed with hospitalist who will see patient and admit.  Patient's with a significant anemia on admission back in May.  Covid testing and influenza testing negative.  Patient does have a leukocytosis with a white count of 16,000.  Patient's electrolytes without significant abnormalities.  Renal function normal.    Final Clinical Impression(s) / ED Diagnoses Final diagnoses:  SOB (shortness of breath)  Anemia, unspecified type    Rx / DC Orders ED Discharge Orders    None       Fredia Sorrow, MD 07/24/20 1650

## 2020-07-24 NOTE — ED Notes (Signed)
Pt very anxious and moving about in bed. Pt anxious and nurse having difficulty completing task due to pt continually asking for something

## 2020-07-24 NOTE — H&P (Signed)
History and Physical  Kayla Ryan ZOX:[id number] DOB: Nov 18, 1955 DOA: 07/24/2020  Referring physician: Dr Rogene Houston, ED physician PCP: Sandi Mariscal, MD  Outpatient Specialists:   Patient Coming From: home  Chief Complaint: SOB  HPI: Kayla Ryan is a 64 y.o. female with a history of COPD/emphysema, Crohn's disease, fibromyalgia/chronic pain on chronic opiates, IBS, polymyositis.  Patient seen for 2 weeks.  She reports dry cough, fullness of her lungs.  Shortness of breath worse with exertion and improved with rest.  Her oxygen saturation was in the mid 90s on EMS arrival.  They started her on 2 L nasal cannula with subjective improvement.  No other palliating or provoking fevers.  The patient is currently being treated for pneumonia and is on Augmentin.  She will finish out her antibiotics in 4 days.  No fevers, chills, nausea, vomiting.  Emergency Department Course: Chest x-ray shows pulmonary edema.  Blood work shows anemia with hemoglobin of 6.5.  In reviewing the chart, the patient has had intermittent anemia over the past several years.  She has received blood transfusions in the past.  Review of Systems:   Pt denies any fevers, chills, nausea, vomiting, diarrhea, constipation, abdominal pain, palpitations, headache, vision changes, lightheadedness, dizziness, melena, rectal bleeding.  Review of systems are otherwise negative  Past Medical History:  Diagnosis Date  . Arthritis   . Asthma   . Chest pain   . Chronic bronchitis   . COPD (chronic obstructive pulmonary disease) ([place])   . Crohn disease ([place])   . Emphysema   . Emphysema of lung ([place])   . Fibromyalgia   . Herpes   . Hyperlipidemia   . IBS (irritable bowel syndrome)   . Migraines   . Muscular dystrophy ([place])   . Neck pain   . Plantar fasciitis   . Polymyositis ([place])   . Scoliosis    Past Surgical History:  Procedure Laterality Date  . ABDOMINAL HYSTERECTOMY    . bone spur    . CHOLECYSTECTOMY    . HERNIA  REPAIR    . LEFT HEART CATHETERIZATION WITH CORONARY ANGIOGRAM N/A 11/07/2013   Procedure: LEFT HEART CATHETERIZATION WITH CORONARY ANGIOGRAM;  Surgeon: Lorretta Harp, MD;  Location[facility];  Service: Cardiovascular;  Laterality: N/A;  . MASTECTOMY PARTIAL / LUMPECTOMY    . OOPHORECTOMY    . ROTATOR CUFF REPAIR     Social History:  reports that she quit smoking about 9 years ago. Her smoking use included cigarettes. She started smoking about 53 years ago. She has a 49.00 pack-year smoking history. She has never used smokeless tobacco. She reports that she does not drink alcohol and does not use drugs. Patient lives at home  Allergies  Allergen Reactions  . Sulfa Antibiotics Shortness Of Breath, Swelling and Rash  . Dilaudid [Hydromorphone Hcl]     Makes me crazy  . Metronidazole Diarrhea and Nausea And Vomiting  . Prednisone Other (See Comments)    angry angry   . Cephalexin Diarrhea and Nausea And Vomiting  . Methotrexate Derivatives Swelling and Rash  . Morphine And Related Anxiety    "exreme mood swings"    Family History  Problem Relation Age of Onset  . Lung cancer Mother   . COPD Father   . Lung cancer Father   . Lymphoma Father       Prior to Admission medications   Medication Sig Start Date End Date Taking? Authorizing Provider  amoxicillin-clavulanate (AUGMENTIN) 875-125 MG tablet  Take 1 tablet by mouth 2 (two) times daily for 7 days. 07/21/20 07/28/20 Yes Parrett, Tammy S, NP  cetirizine (ZYRTEC) 10 MG tablet Take 10 mg by mouth daily.   Yes [provider]  Coenzyme Q10 10 MG capsule Take 40 mg by mouth daily. 4 capsules po qd   Yes [provider]  diclofenac sodium (VOLTAREN) 1 % GEL Apply 2 g topically 4 (four) times daily as needed (for pain). 08/21/17  Yes Hudnall, Sharyn Lull, MD  doxepin (SINEQUAN) 10 MG capsule Take 10 mg by mouth at bedtime. 10/11/13  Yes [provider]  gabapentin (NEURONTIN) 300 MG capsule Take 300 mg by mouth  3 (three) times daily.   Yes [provider]  gemfibrozil (LOPID) 600 MG tablet Take 600 mg by mouth 2 (two) times daily before a meal.   Yes [provider]  ipratropium-albuterol (DUONEB) 0.5-2.5 (3) MG/3ML SOLN Take 3 mLs by nebulization every 4 (four) hours as needed.   Yes [provider]  isosorbide mononitrate (IMDUR) 30 MG 24 hr tablet Take 30 mg by mouth daily. 10/06/13  Yes [provider]  magnesium hydroxide (MILK OF MAGNESIA) 400 MG/5ML suspension Take 15 mLs by mouth at bedtime.   Yes [provider]  Melatonin 10 MG TBCR Take 60 mg by mouth at bedtime.   Yes [provider]  Multiple Vitamin (MULTIVITAMIN) capsule Take 1 capsule by mouth daily.   Yes [provider]  omeprazole (PRILOSEC) 40 MG capsule Take 1 capsule (40 mg total) by mouth daily. 01/03/20  Yes Shahmehdi, Valeria Batman, MD  oxyCODONE (OXY IR/ROXICODONE) 5 MG immediate release tablet  05/29/19  Yes [provider]  Tiotropium Bromide-Olodaterol (STIOLTO RESPIMAT) 2.5-2.5 MCG/ACT AERS Inhale 2 puffs into the lungs daily. 06/08/20  Yes Lauraine Rinne, NP  topiramate (TOPAMAX) 100 MG tablet Take 100 mg by mouth 2 (two) times daily. 04/28/20  Yes [provider]  valACYclovir (VALTREX) 500 MG tablet Take 500 mg by mouth 2 (two) times daily.   Yes [provider]  Vitamin D, Ergocalciferol, (DRISDOL) 50000 UNITS CAPS capsule Take 1 Units by mouth every 7 (seven) days. 10/18/13  Yes [provider]  XTAMPZA ER 18 MG C12A Take 1 capsule by mouth 2 (two) times daily. 05/29/19  Yes [provider]  albuterol (PROAIR HFA) 108 (90 Base) MCG/ACT inhaler Inhale 2 puffs into the lungs every 6 (six) hours as needed for wheezing or shortness of breath. 06/09/19   Barton Dubois, MD  DULoxetine (CYMBALTA) 60 MG capsule Take 60 mg by mouth 2 (two) times daily. Patient not taking: Reported on 07/24/2020 10/11/13   [provider]   NITROSTAT 0.4 MG SL tablet Place 0.4 mg under the tongue every 15 (fifteen) minutes as needed for chest pain.  09/22/13   [provider]    Physical Exam: BP (!) 148/61   Pulse 95   Temp 98.5 F (36.9 C) (Oral)   Resp 18   Ht 5' 9"  (1.753 m)   Wt 78 kg   SpO2 100%   BMI 25.40 kg/m   . General: Elderly female. Awake and alert and oriented x3. No acute cardiopulmonary distress.  Marland Kitchen HEENT: Normocephalic atraumatic.  Right and left ears normal in appearance.  Pupils equal, round, reactive to light. Extraocular muscles are intact. Sclerae anicteric and noninjected.  Moist mucosal membranes. No mucosal lesions.  . Neck: Neck supple without lymphadenopathy. No carotid bruits. No masses palpated.  . Cardiovascular:  Regular rate with normal S1-S2 sounds. No murmurs, rubs, gallops auscultated. No JVD.  Marland Kitchen Respiratory: Slight Rales in the bases bilaterally. No accessory muscle use. . Abdomen: Soft, nontender, nondistended. Active bowel sounds. No masses or hepatosplenomegaly  . Skin: No rashes, lesions, or ulcerations.  Dry, warm to touch. 2+ dorsalis pedis and radial pulses. . Musculoskeletal: No calf or leg pain. All major joints not erythematous nontender.  No upper or lower joint deformation.  Good ROM.  No contractures  . Psychiatric: Intact judgment and insight. Pleasant and cooperative. . Neurologic: No focal neurological deficits. Strength is 5/5 and symmetric in upper and lower extremities.  Cranial nerves II through XII are grossly intact.           Labs on Admission: I have personally reviewed following labs and imaging studies  CBC: Recent Labs  Lab 07/24/20 1101  WBC 16.2*  HGB 6.5*  HCT 23.5*  MCV 79.7*  PLT 962*   Basic Metabolic Panel: Recent Labs  Lab 07/24/20 1101  NA 135  K 3.7  CL 104  CO2 21*  GLUCOSE 136*  BUN 15  CREATININE 0.98  CALCIUM 8.8*   GFR: Estimated Creatinine Clearance: 59 mL/min (by C-G formula based on SCr of 0.98 mg/dL). Liver  Function Tests: Recent Labs  Lab 07/24/20 1101  AST 47*  ALT 34  ALKPHOS 73  BILITOT 0.7  PROT 7.7  ALBUMIN 4.1   No results for input(s): LIPASE, AMYLASE in the last 168 hours. No results for input(s): AMMONIA in the last 168 hours. Coagulation Profile: No results for input(s): INR, PROTIME in the last 168 hours. Cardiac Enzymes: No results for input(s): CKTOTAL, CKMB, CKMBINDEX, TROPONINI in the last 168 hours. BNP (last 3 results) No results for input(s): PROBNP in the last 8760 hours. HbA1C: No results for input(s): HGBA1C in the last 72 hours. CBG: No results for input(s): GLUCAP in the last 168 hours. Lipid Profile: No results for input(s): CHOL, HDL, LDLCALC, TRIG, CHOLHDL, LDLDIRECT in the last 72 hours. Thyroid Function Tests: No results for input(s): TSH, T4TOTAL, FREET4, T3FREE, THYROIDAB in the last 72 hours. Anemia Panel: No results for input(s): VITAMINB12, FOLATE, FERRITIN, TIBC, IRON, RETICCTPCT in the last 72 hours. Urine analysis:    Component Value Date/Time   COLORURINE STRAW (A) 06/08/2019 0103   APPEARANCEUR CLEAR 06/08/2019 0103   LABSPEC 1.005 06/08/2019 0103   PHURINE 7.0 06/08/2019 0103   GLUCOSEU NEGATIVE 06/08/2019 0103   HGBUR NEGATIVE 06/08/2019 0103   BILIRUBINUR NEGATIVE 06/08/2019 0103   KETONESUR NEGATIVE 06/08/2019 0103   PROTEINUR NEGATIVE 06/08/2019 0103   UROBILINOGEN 0.2 06/08/2011 1400   NITRITE NEGATIVE 06/08/2019 0103   LEUKOCYTESUR NEGATIVE 06/08/2019 0103   Sepsis Labs: @LABRCNTIP (procalcitonin:4,lacticidven:4) ) Recent Results (from the past 240 hour(s))  Resp Panel by RT-PCR (Flu A&B, Covid) Nasopharyngeal Swab     Status: None   Collection Time: 07/24/20 12:03 PM   Specimen: Nasopharyngeal Swab; Nasopharyngeal(NP) swabs in vial transport medium  Result Value Ref Range Status   SARS Coronavirus 2 by RT PCR NEGATIVE NEGATIVE Final    Comment: (NOTE) SARS-CoV-2 target nucleic acids are NOT DETECTED.  The SARS-CoV-2  RNA is generally detectable in upper respiratory specimens during the acute phase of infection. The lowest concentration of SARS-CoV-2 viral copies this assay can detect is 138 copies/mL. A negative result does not preclude SARS-Cov-2 infection and should not be used as the sole basis for treatment or other patient management decisions. A negative result may occur with  improper specimen collection/handling, submission of specimen other than nasopharyngeal swab, presence of viral mutation(s) within the areas targeted by this assay, and inadequate number of viral copies(<138 copies/mL). A negative result must be combined with clinical observations, patient history, and epidemiological information. The expected result is Negative.  Fact Sheet for Patients:  EntrepreneurPulse.com.au  Fact Sheet for Healthcare Providers:  IncredibleEmployment.be  This test is no t yet approved or cleared by the [place] FDA and  has been authorized for detection and/or diagnosis of SARS-CoV-2 by FDA under an Emergency Use Authorization (EUA). This EUA will remain  in effect (meaning this test can be used) for the duration of the COVID-19 declaration under Section 564(b)(1) of the Act, 21 U.S.C.section 360bbb-3(b)(1), unless the authorization is terminated  or revoked sooner.       Influenza A by PCR NEGATIVE NEGATIVE Final   Influenza B by PCR NEGATIVE NEGATIVE Final    Comment: (NOTE) The Xpert Xpress SARS-CoV-2/FLU/RSV plus assay is intended as an aid in the diagnosis of influenza from Nasopharyngeal swab specimens and should not be used as a sole basis for treatment. Nasal washings and aspirates are unacceptable for Xpert Xpress SARS-CoV-2/FLU/RSV testing.  Fact Sheet for Patients: EntrepreneurPulse.com.au  Fact Sheet for Healthcare Providers: IncredibleEmployment.be  This test is not yet approved or cleared by the  [place] FDA and has been authorized for detection and/or diagnosis of SARS-CoV-2 by FDA under an Emergency Use Authorization (EUA). This EUA will remain in effect (meaning this test can be used) for the duration of the COVID-19 declaration under Section 564(b)(1) of the Act, 21 U.S.C. section 360bbb-3(b)(1), unless the authorization is terminated or revoked.  Performed at [facility], [address], [place], [place] [address]      Radiological Exams on Admission: CT Angio Chest PE W/Cm &/Or Wo Cm  Result Date: 07/24/2020 CLINICAL DATA:  Cough and shortness of breath for 2 weeks. EXAM: CT ANGIOGRAPHY CHEST WITH CONTRAST TECHNIQUE: Multidetector CT imaging of the chest was performed using the standard protocol during bolus administration of intravenous contrast. Multiplanar CT image reconstructions and MIPs were obtained to evaluate the vascular anatomy. CONTRAST:  118m OMNIPAQUE IOHEXOL 350 MG/ML SOLN COMPARISON:  Chest radiograph earlier today. FINDINGS: Cardiovascular: There are no filling defects within the pulmonary arteries to suggest pulmonary embolus. No aortic dissection or acute aortic abnormality upper normal heart size. No pericardial effusion. Cardiac motion limits assessment for coronary artery calcifications. Mediastinum/Nodes: Small bilateral hilar lymph nodes are not enlarged by size criteria. Occasional small mediastinal nodes. There is an 11 mm right thyroid nodule. Not clinically significant; no follow-up imaging recommended (ref: J Am Coll Radiol. 2015 Feb;12(2): 143-50).No esophageal wall thickening. Lungs/Pleura: Moderate emphysema. There is mild smooth septal thickening at the bases suggesting pulmonary edema. No significant pleural fluid. The subsegmental atelectasis or scarring in the lingula. In the posterior right upper lobe there is a 7 x 9 mm nodule (8 mm mean diameter) on image 52 of series 8. There also a small subpleural nodules in the right lower lobe, series  8, image 123. Upper Abdomen: Assessed on concurrent abdominal CT, reported separately. Musculoskeletal: Thoracic spondylosis. There are no acute or suspicious osseous abnormalities. Review of the MIP images confirms the above findings. IMPRESSION: 1. No pulmonary embolus. 2. Smooth septal thickening suggestive of pulmonary edema. Upper normal heart size. 3. Moderate emphysema. 4. Right upper lobe pulmonary nodule measuring 8 mm mean diameter, with additional small subpleural nodules in the right lower lobe. Non-contrast chest CT  at 3-6 months is recommended. If the nodules are stable at time of repeat CT, then future CT at 18-24 months (from today's scan) is considered optional for low-risk patients, but is recommended for high-risk patients. This recommendation follows the consensus statement: Guidelines for Management of Incidental Pulmonary Nodules Detected on CT Images: From the Fleischner Society 2017; Radiology 2017; 284:228-243. Aortic Atherosclerosis (ICD10-I70.0) and Emphysema (ICD10-J43.9). Electronically Signed   By: Keith Rake M.D.   On: 07/24/2020 16:01   CT Abdomen Pelvis W Contrast  Result Date: 07/24/2020 CLINICAL DATA:  Acute abdominal pain.  Nonlocalized GI bleed. EXAM: CT ABDOMEN AND PELVIS WITH CONTRAST TECHNIQUE: Multidetector CT imaging of the abdomen and pelvis was performed using the standard protocol following bolus administration of intravenous contrast. CONTRAST:  118m OMNIPAQUE IOHEXOL 350 MG/ML SOLN COMPARISON:  Most recent abdominal CT 08/13/2018 FINDINGS: Lower chest: Assessed on concurrent chest CT, reported separately. Septal thickening at the bases. Hepatobiliary: Focal fatty infiltration adjacent to the falciform ligament. No suspicious lesion. Clips in the gallbladder fossa postcholecystectomy. No biliary dilatation. Pancreas: No ductal dilatation or inflammation. Spleen: Normal in size without focal abnormality. Adrenals/Urinary Tract: Normal adrenal glands. No  hydronephrosis or perinephric edema. Chronic extrarenal pelvis configuration of the left kidney. Homogeneous renal enhancement with symmetric excretion on delayed phase imaging. Tiny cortical hypodensity in the lower left kidney is too small to accurately characterize, not significantly changed from prior. Urinary bladder is distended without wall thickening. Stomach/Bowel: The stomach is decompressed. Normal positioning of the duodenum and ligament of Treitz. Short segment of prominent small bowel that are fluid-filled with air-fluid levels in the left upper quadrant. No associated bowel wall thickening or pneumatosis. No mesenteric edema. Remaining small bowel is decompressed. Appendix tentatively visualized and normal, for example series 2, image 55. mild colonic tortuosity with small volume of colonic stool. There is no colonic wall thickening or inflammation. Distal descending and sigmoid diverticulosis without diverticulitis. Vascular/Lymphatic: Moderate aortic and branch atherosclerosis. Mesenteric vessels are patent. The portal vein is patent. There is no mesenteric or portal venous gas. No enlarged lymph nodes in the abdomen or pelvis. Reproductive: Status post hysterectomy. No adnexal masses. Other: Small fat containing umbilical hernia. No ascites. No free air. Mild dependent subcutaneous soft tissue edema. Musculoskeletal: There are no acute or suspicious osseous abnormalities. There are transverse process fractures on the right at L1 through L3, and on the left at L3, but are remote. IMPRESSION: 1. Short segment of prominent fluid-filled small bowel with air-fluid levels in the left upper quadrant, may represent enteritis or focal ileus. 2. Distal colonic diverticulosis without diverticulitis. 3. Distended urinary bladder without wall thickening. Aortic Atherosclerosis (ICD10-I70.0). Electronically Signed   By: MKeith RakeM.D.   On: 07/24/2020 16:09   DG Chest Port 1 View  Result Date:  07/24/2020 CLINICAL DATA:  Cough and shortness of breath for the past few weeks. EXAM: PORTABLE CHEST 1 VIEW COMPARISON:  Chest x-ray dated Jan 02, 2020. FINDINGS: The heart size and mediastinal contours are within normal limits. New diffuse interstitial thickening with Kerley B-lines at the lung bases. No focal consolidation, pleural effusion, or pneumothorax. No acute osseous abnormality. IMPRESSION: 1. Interstitial pulmonary edema. Electronically Signed   By: WTitus DubinM.D.   On: 07/24/2020 11:28    EKG: Independently reviewed.  Sinus tachycardia with slight ST depression in the lateral leads.  No acute ST changes.  Assessment/Plan: Principal Problem:   Acute on chronic anemia Active Problems:   Carotid artery disease ([place]  Chronic obstructive pulmonary disease (HCC)   Chronic pain   Essential hypertension   History of Crohn's disease   Leukocytosis   Pulmonary nodules   Acute CHF (congestive heart failure) ([place])    This patient was discussed with the ED physician, including pertinent vitals, physical exam findings, labs, and imaging.  We also discussed care given by the ED provider.  1. Acute on chronic anemia a. Uncertain etiology.  Patient has a history of Crohn's disease, although the patient states that she is recovered.  No gross abnormality on CT abdomen to suggest inflammatory bowel syndrome, although chronic poor absorption could lead to the severe anemia. b. Transfused 2 units as patient is symptomatic c. Recheck CBC in the morning d. Hemoccult negative 2. CHF a. Uncertain of whether this is secondary to anemia and increased cardiac demand. b. Check echocardiogram c. Replace potassium d. Continue Lasix orally e. Strict I's and O's f. Fluid restrict g. Patient had good diuresis with a single dose of Lasix in the ED h. Telemetry monitoring i. Check metabolic panel in the morning for creatinine and potassium 3. Pulmonary nodules a. Will need follow-up CT with  PCP 4. Leukocytosis a. Uncertain of primary etiology of leukocytosis.  Patient has not been on steroids. b. No active infection c. Will follow CBC 5. History of Crohn's disease 6. Hypertension a. Start lisinopril 7. Chronic pain a. Continue home regimen 8. COPD  DVT prophylaxis: SCDs Consultants: None Code Status: Full code confirmed by patient Family Communication: None Disposition Plan: Patient should be able to return home   Truett Mainland, DO

## 2020-07-24 NOTE — TOC Initial Note (Signed)
Transition of Care [facility]) - Initial/Assessment Note    Patient Details  Name: Kayla Ryan MRN: [id number] Date of Birth: 1955/10/23  [facility] CM/SW Contact:    [name], [place] Phone Number: 07/24/2020, 7:01 PM  Clinical Narrative:                 Pt admitted for acute on chronic anemia. TOC received consult for CHF screen. CSW visited pt in ED to complete assessment and CHF screen. Pt states that she lives alone. Pt states she has been having more difficulty lately with her ADLs. Pt does not drive and her neighbor provides her transportation. Pt states she has not had [facility] services. Pt has a cane.   Pt educated on importance of daily weights and when to f/u with cardiologist. Pt is not set up with a cardiologist at this time. Pt educated on importance of following a heart healthy diet. Pt states she takes all medications as prescribed. TOC to follow.   Expected Discharge Plan: Home/Self Care Barriers to Discharge: Continued Medical Work up   Patient Goals and CMS Choice Patient states their goals for this hospitalization and ongoing recovery are:: Return home   Choice offered to / list presented to : NA  Expected Discharge Plan and Services Expected Discharge Plan: Home/Self Care In-house Referral: Clinical Social Work Discharge Planning Services: CM Consult Post Acute Care Choice: NA Living arrangements for the past 2 months: Apartment                 DME Arranged: N/A DME Agency: NA       HH Arranged: NA [place] Agency: NA        Prior Living Arrangements/Services Living arrangements for the past 2 months: Apartment Lives with:: Self Patient language and need for interpreter reviewed:: Yes Do you feel safe going back to the place where you live?: Yes      Need for Family Participation in Patient Care: No (Comment)     Criminal Activity/Legal Involvement Pertinent to Current Situation/Hospitalization: No - Comment as needed  Activities of Daily  Living      Permission Sought/Granted                  Emotional Assessment Appearance:: Appears stated age Attitude/Demeanor/Rapport: Engaged Affect (typically observed): Accepting,Anxious Orientation: : Oriented to Self,Oriented to Place,Oriented to  Time,Oriented to Situation Alcohol / Substance Use: Not Applicable Psych Involvement: No (comment)  Admission diagnosis:  Acute on chronic anemia [D64.9] Patient Active Problem List   Diagnosis Date Noted  . Acute on chronic anemia 07/24/2020  . Leukocytosis 07/24/2020  . Pulmonary nodules 07/24/2020  . Acute CHF (congestive heart failure) ([place]) 07/24/2020  . Dyspnea 01/31/2020  . Anemia 01/03/2020  . Syncope 06/08/2019  . Hypokalemia 06/08/2019  . Trigger finger, acquired 11/03/2017  . Chronic obstructive pulmonary disease ([place]) 08/23/2017  . Chronic pain 08/23/2017  . History of Crohn's disease 08/23/2017  . Myopathy 08/23/2017  . Right shoulder injury, subsequent encounter 08/23/2017  . Primary insomnia 02/01/2017  . Esophageal dysphagia 10/06/2016  . Essential hypertension 07/17/2016  . Iron deficiency anemia 03/25/2016  . History of Bell's palsy 03/03/2015  . Nuclear sclerosis of both eyes 03/03/2015  . Allergic rhinitis 05/18/2014  . Anxiety 05/18/2014  . Arthritis 05/18/2014  . Asthma 05/18/2014  . Chronic constipation 05/18/2014  . Dermatomyositis ([place]) 05/18/2014  . Migraine without status migrainosus, not intractable 05/18/2014  . Rectocele 05/18/2014  . Other nonrheumatic mitral valve  disorders 05/18/2014  . Peripheral neuropathy 05/18/2014  . Chest pain 10/30/2013  . Hyperlipidemia 10/30/2013  . Carotid artery disease ([place]) 10/30/2013   PCP:  Sandi Mariscal, MD Pharmacy:   [facility], [place] [address] Phone: [phone] Fax: [phone]  [id number] [place] [place]: [phone]: [address] [place]Phone: [phone]: [phone]    Social Determinants of Health (SDOH) Interventions    Readmission Risk Interventions No flowsheet data found.

## 2020-07-25 ENCOUNTER — Observation Stay (HOSPITAL_COMMUNITY): Payer: Medicare Other

## 2020-07-25 DIAGNOSIS — I110 Hypertensive heart disease with heart failure: Secondary | ICD-10-CM | POA: Diagnosis present

## 2020-07-25 DIAGNOSIS — Z9010 Acquired absence of unspecified breast and nipple: Secondary | ICD-10-CM | POA: Diagnosis not present

## 2020-07-25 DIAGNOSIS — I480 Paroxysmal atrial fibrillation: Secondary | ICD-10-CM | POA: Diagnosis present

## 2020-07-25 DIAGNOSIS — I5033 Acute on chronic diastolic (congestive) heart failure: Secondary | ICD-10-CM | POA: Diagnosis present

## 2020-07-25 DIAGNOSIS — Z87891 Personal history of nicotine dependence: Secondary | ICD-10-CM | POA: Diagnosis not present

## 2020-07-25 DIAGNOSIS — E876 Hypokalemia: Secondary | ICD-10-CM | POA: Diagnosis present

## 2020-07-25 DIAGNOSIS — Z881 Allergy status to other antibiotic agents status: Secondary | ICD-10-CM | POA: Diagnosis not present

## 2020-07-25 DIAGNOSIS — M797 Fibromyalgia: Secondary | ICD-10-CM | POA: Diagnosis present

## 2020-07-25 DIAGNOSIS — I4891 Unspecified atrial fibrillation: Secondary | ICD-10-CM | POA: Diagnosis present

## 2020-07-25 DIAGNOSIS — Z7951 Long term (current) use of inhaled steroids: Secondary | ICD-10-CM | POA: Diagnosis not present

## 2020-07-25 DIAGNOSIS — J9601 Acute respiratory failure with hypoxia: Secondary | ICD-10-CM | POA: Diagnosis present

## 2020-07-25 DIAGNOSIS — E785 Hyperlipidemia, unspecified: Secondary | ICD-10-CM | POA: Diagnosis present

## 2020-07-25 DIAGNOSIS — J439 Emphysema, unspecified: Secondary | ICD-10-CM | POA: Diagnosis present

## 2020-07-25 DIAGNOSIS — I5021 Acute systolic (congestive) heart failure: Secondary | ICD-10-CM | POA: Diagnosis not present

## 2020-07-25 DIAGNOSIS — Z885 Allergy status to narcotic agent status: Secondary | ICD-10-CM | POA: Diagnosis not present

## 2020-07-25 DIAGNOSIS — G7100 Muscular dystrophy, unspecified: Secondary | ICD-10-CM | POA: Diagnosis present

## 2020-07-25 DIAGNOSIS — E039 Hypothyroidism, unspecified: Secondary | ICD-10-CM | POA: Diagnosis present

## 2020-07-25 DIAGNOSIS — K9089 Other intestinal malabsorption: Secondary | ICD-10-CM | POA: Diagnosis present

## 2020-07-25 DIAGNOSIS — G8929 Other chronic pain: Secondary | ICD-10-CM | POA: Diagnosis present

## 2020-07-25 DIAGNOSIS — Z801 Family history of malignant neoplasm of trachea, bronchus and lung: Secondary | ICD-10-CM | POA: Diagnosis not present

## 2020-07-25 DIAGNOSIS — Z825 Family history of asthma and other chronic lower respiratory diseases: Secondary | ICD-10-CM | POA: Diagnosis not present

## 2020-07-25 DIAGNOSIS — D649 Anemia, unspecified: Secondary | ICD-10-CM | POA: Diagnosis not present

## 2020-07-25 DIAGNOSIS — J189 Pneumonia, unspecified organism: Secondary | ICD-10-CM | POA: Diagnosis present

## 2020-07-25 DIAGNOSIS — Z79899 Other long term (current) drug therapy: Secondary | ICD-10-CM | POA: Diagnosis not present

## 2020-07-25 DIAGNOSIS — M3320 Polymyositis, organ involvement unspecified: Secondary | ICD-10-CM | POA: Diagnosis present

## 2020-07-25 DIAGNOSIS — Z20822 Contact with and (suspected) exposure to covid-19: Secondary | ICD-10-CM | POA: Diagnosis present

## 2020-07-25 DIAGNOSIS — D529 Folate deficiency anemia, unspecified: Secondary | ICD-10-CM | POA: Diagnosis present

## 2020-07-25 LAB — CBC WITH DIFFERENTIAL/PLATELET
Abs Immature Granulocytes: 0.08 10*3/uL — ABNORMAL HIGH (ref 0.00–0.07)
Basophils Absolute: 0.1 10*3/uL (ref 0.0–0.1)
Basophils Relative: 1 %
Eosinophils Absolute: 0.3 10*3/uL (ref 0.0–0.5)
Eosinophils Relative: 3 %
HCT: 33.2 % — ABNORMAL LOW (ref 36.0–46.0)
Hemoglobin: 9.9 g/dL — ABNORMAL LOW (ref 12.0–15.0)
Immature Granulocytes: 1 %
Lymphocytes Relative: 21 %
Lymphs Abs: 2.1 10*3/uL (ref 0.7–4.0)
MCH: 24 pg — ABNORMAL LOW (ref 26.0–34.0)
MCHC: 29.8 g/dL — ABNORMAL LOW (ref 30.0–36.0)
MCV: 80.6 fL (ref 80.0–100.0)
Monocytes Absolute: 1 10*3/uL (ref 0.1–1.0)
Monocytes Relative: 10 %
Neutro Abs: 6.5 10*3/uL (ref 1.7–7.7)
Neutrophils Relative %: 64 %
Platelets: 412 10*3/uL — ABNORMAL HIGH (ref 150–400)
RBC: 4.12 MIL/uL (ref 3.87–5.11)
RDW: 18.6 % — ABNORMAL HIGH (ref 11.5–15.5)
WBC: 10.1 10*3/uL (ref 4.0–10.5)
nRBC: 0.2 % (ref 0.0–0.2)

## 2020-07-25 LAB — IRON AND TIBC
Iron: 58 ug/dL (ref 28–170)
Saturation Ratios: 11 % (ref 10.4–31.8)
TIBC: 537 ug/dL — ABNORMAL HIGH (ref 250–450)
UIBC: 479 ug/dL

## 2020-07-25 LAB — HIV ANTIBODY (ROUTINE TESTING W REFLEX): HIV Screen 4th Generation wRfx: NONREACTIVE

## 2020-07-25 LAB — TYPE AND SCREEN
ABO/RH(D): A NEG
Antibody Screen: NEGATIVE
Unit division: 0
Unit division: 0

## 2020-07-25 LAB — BASIC METABOLIC PANEL
Anion gap: 11 (ref 5–15)
BUN: 18 mg/dL (ref 8–23)
CO2: 25 mmol/L (ref 22–32)
Calcium: 8.9 mg/dL (ref 8.9–10.3)
Chloride: 100 mmol/L (ref 98–111)
Creatinine, Ser: 0.98 mg/dL (ref 0.44–1.00)
GFR, Estimated: >60 mL/min (ref >60)
Glucose, Bld: 83 mg/dL (ref 70–99)
Potassium: 3 mmol/L — ABNORMAL LOW (ref 3.5–5.1)
Sodium: 136 mmol/L (ref 135–145)

## 2020-07-25 LAB — BPAM RBC
Blood Product Expiration Date: 202201092359
Blood Product Expiration Date: 202201092359
ISSUE DATE / TIME: 202112171328
ISSUE DATE / TIME: 202112171601
Unit Type and Rh: 600
Unit Type and Rh: 600

## 2020-07-25 LAB — FERRITIN: Ferritin: 14 ng/mL (ref 11–307)

## 2020-07-25 LAB — MAGNESIUM: Magnesium: 2 mg/dL (ref 1.7–2.4)

## 2020-07-25 MED ORDER — DILTIAZEM HCL 25 MG/5ML IV SOLN
10.0000 mg | Freq: Once | INTRAVENOUS | Status: AC
  Administered 2020-07-25: 08:00:00 10 mg via INTRAVENOUS
  Filled 2020-07-25: qty 5

## 2020-07-25 MED ORDER — MAGNESIUM SULFATE 2 GM/50ML IV SOLN
2.0000 g | Freq: Once | INTRAVENOUS | Status: AC
  Administered 2020-07-25: 09:00:00 2 g via INTRAVENOUS
  Filled 2020-07-25: qty 50

## 2020-07-25 MED ORDER — DILTIAZEM HCL 25 MG/5ML IV SOLN
10.0000 mg | Freq: Once | INTRAVENOUS | Status: AC
  Administered 2020-07-25: 09:00:00 10 mg via INTRAVENOUS
  Filled 2020-07-25: qty 5

## 2020-07-25 MED ORDER — POTASSIUM CHLORIDE 10 MEQ/100ML IV SOLN
10.0000 meq | INTRAVENOUS | Status: DC
  Filled 2020-07-25: qty 100

## 2020-07-25 MED ORDER — DILTIAZEM HCL 30 MG PO TABS
30.0000 mg | ORAL_TABLET | Freq: Four times a day (QID) | ORAL | Status: DC
  Administered 2020-07-25 – 2020-07-26 (×5): 30 mg via ORAL
  Filled 2020-07-25 (×5): qty 1

## 2020-07-25 MED ORDER — POTASSIUM CHLORIDE 10 MEQ/100ML IV SOLN
10.0000 meq | INTRAVENOUS | Status: AC
  Administered 2020-07-25 (×4): 10 meq via INTRAVENOUS
  Filled 2020-07-25 (×3): qty 100

## 2020-07-25 MED ORDER — MAGNESIUM HYDROXIDE 400 MG/5ML PO SUSP
15.0000 mL | Freq: Every day | ORAL | Status: DC | PRN
  Administered 2020-07-26: 06:00:00 15 mL via ORAL
  Filled 2020-07-25: qty 30

## 2020-07-25 NOTE — Progress Notes (Signed)
Telemetry called night shift nurse during shift report stating patients heart rate was 139. Night shift nurse paged Dr. Beryle Lathe night shift nurse stated dr. Beryle Lathe gave verbal for stat 12 leak EKG. This nurse paged Dr. Joesph Fillers who is the attendting today to let him know that patient was showing afib and that Dr. Beryle Lathe ordered 12 lead EKG. 12 lead EKG obtained by Respiratory Therapist. Reading was Afib with RVR. Dr. Joesph Fillers paged with results. Dr. Joesph Fillers placed orders. Will continue to assess patient.

## 2020-07-25 NOTE — Progress Notes (Signed)
Patient Demographics:    Kayla Ryan, is a 64 y.o. female, DOB - [birth year], DTO:[id number]  Admit date - 07/24/2020   Admitting Physician Martinique Pizzimenti Denton Brick, MD  Outpatient Primary MD for the patient is Sandi Mariscal, MD  LOS - 0   Chief Complaint  Patient presents with  . Shortness of Breath        Subjective:    Kayla Ryan today has no fevers, no emesis,   -Had episodes of palpitations found to be in A. fib with RVR with heart rate in the 140s to 150s -Dyspnea at rest and dyspnea on exertion persist -Endorses some dizziness especially with A. fib with RVR  Assessment  & Plan :    Principal Problem:   Acute on chronic anemia Active Problems:   Atrial fibrillation with RVR (HCC)   Carotid artery disease (HCC)   Chronic obstructive pulmonary disease/Emphysema   Chronic pain   Essential hypertension   History of Crohn's disease   Leukocytosis   Pulmonary nodules   Acute CHF (congestive heart failure) (HCC)  Brief Summary:- 64 year-old female reformed smoker with past medical history relevant for COPD/emphysema, Crohn's disease, inflammatory myopathy, fibromyalgia and chronic anemia admitted on 07/24/2020 with acute on chronic anemia and concern for CHF exacerbation, on 07/25/2020 patient went into A. fib with RVR   A/p 1)New onset A. fib with RVR--- IV Cardizem given, heart rate remains elevated, will repeat IV Cardizem dose, start p.o. Cardizem, if echo shows low EF we will switch to metoprolol for rate control -Not a candidate for anticoagulation at this time given significant anemia requiring transfusion  CHA2DS2- VASc score   is = 5 (age, gender, HTN, CHD and PAD (Carotid artery )   Which is  equal to = 7.2 % annual risk of stroke  This patients CHA2DS2-VASc Score and unadjusted Ischemic Stroke Rate (% per year) is equal to 7.2 % stroke rate/year from a score of 5 - -Replace  potassium -Magnesium WNL -Check TSH -Repeat echo pending  2)New onset CHF--- ??  Suspect underlying chronic diastolic dysfunction CHF, prior echo from October 2020 with EF of 60 to 65%,, --- repeat echo pending due to #1 above -Continue IV diuresis, daily weight and fluid input and output monitoring -Continue isosorbide and lisinopril  3)Acute on chronic Anemia--- stool Hemoccult negative, but some degree of malabsorption in a patient with Crohn's colitis -Hemoglobin is up to 9.9 from 6.5 after transfusion of 2 units of PRBCs --Patient received transfusion in the past as well [facility] has been low, MCV has been borderline low with elevated RDW suspect some degree of iron deficiency -Check anemia labs  4)COPD/Emphysema--- no acute exacerbation at this time, continue bronchodilators -Continue Augmentin  5)Fibromyalgia chronic pain/inflammatory myopathy--- continue home regimen  6)HTN--continue lisinopril and isosorbide, Cardizem added for A. fib as above #1  7)History of Crohn's Colitis--- no acute flareup at this time outpatient follow-up advised  8)Pulmonary Nodules--- patient is a reformed smoker, outpatient CT chest in about 18 months advised  9)Hypokalemia--- replace and recheck  Disposition/Need for in-Hospital Stay- patient unable to be discharged at this time due to --acute CHF exacerbation requiring IV diuretics, A. fib with RVR requiring IV Cardizem, worsening of anemia requiring transfusion of PRBC  Status  is: Inpatient  Remains inpatient appropriate because:Please see above   Disposition: The patient is from: Home              Anticipated d/c is to: Home              Anticipated d/c date is: 2 days              Patient currently is not medically stable to d/c. Barriers: Not Clinically Stable-   Code Status :  -  Code Status: Full Code   Family Communication:   NA (patient is alert, awake and coherent)   Consults  :  na  DVT Prophylaxis  :   - SCDs  SCDs Start:  07/24/20 1758    Lab Results  Component Value Date   PLT 412 (H) 07/25/2020    Inpatient Medications  Scheduled Meds: . amoxicillin-clavulanate  1 tablet Oral BID  . diltiazem  30 mg Oral Q6H  . doxepin  10 mg Oral QHS  . furosemide  40 mg Oral BID  . gabapentin  300 mg Oral TID  . gemfibrozil  600 mg Oral BID AC  . isosorbide mononitrate  30 mg Oral Daily  . lisinopril  20 mg Oral Daily  . loratadine  10 mg Oral Daily  . oxyCODONE  20 mg Oral Q12H  . pantoprazole  40 mg Oral Daily  . potassium chloride  40 mEq Oral BID  . sodium chloride flush  3 mL Intravenous Q12H  . topiramate  100 mg Oral BID  . valACYclovir  500 mg Oral BID   Continuous Infusions: . sodium chloride    . magnesium sulfate bolus IVPB 2 g (07/25/20 0836)  . potassium chloride     PRN Meds:.sodium chloride, acetaminophen, albuterol, ipratropium-albuterol, ondansetron (ZOFRAN) IV, oxyCODONE, sodium chloride flush    Anti-infectives (From admission, onward)   Start     Dose/Rate Route Frequency Ordered Stop   07/24/20 2200  amoxicillin-clavulanate (AUGMENTIN) 875-125 MG per tablet 1 tablet        1 tablet Oral 2 times daily 07/24/20 1757 07/28/20 2159   07/24/20 2200  valACYclovir (VALTREX) tablet 500 mg        500 mg Oral 2 times daily 07/24/20 1757          Objective:   Vitals:   07/25/20 0416 07/25/20 0500 07/25/20 0721 07/25/20 0910  BP: (!) 125/50  (!) 149/85 131/79  Pulse: 79  (!) 154   Resp: 17  20   Temp: (!) 97.4 F (36.3 C)  98.4 F (36.9 C)   TempSrc:      SpO2: 93%  95%   Weight:  78.3 kg    Height:        Wt Readings from Last 3 Encounters:  07/25/20 78.3 kg  05/27/20 79.8 kg  04/28/20 77.1 kg     Intake/Output Summary (Last 24 hours) at 07/25/2020 0912 Last data filed at 07/25/2020 0300 Gross per 24 hour  Intake 630 ml  Output 2000 ml  Net -1370 ml   Physical Exam  Gen:- Awake Alert,  In no apparent distress  HEENT:- [place].AT, No sclera icterus Neck-Supple  Neck,No JVD,.  Lungs-diminished breath sounds, faint bibasilar rales, no wheezing  CV- S1, S2 normal, irregularly irregular and tachycardic Abd-  +ve B.Sounds, Abd Soft, No tenderness,    Extremity/Skin:- No  edema, pedal pulses present  Psych-affect is appropriate, oriented x3 Neuro-no new focal deficits, no tremors   Data Review:  Micro Results Recent Results (from the past 240 hour(s))  Resp Panel by RT-PCR (Flu A&B, Covid) Nasopharyngeal Swab     Status: None   Collection Time: 07/24/20 12:03 PM   Specimen: Nasopharyngeal Swab; Nasopharyngeal(NP) swabs in vial transport medium  Result Value Ref Range Status   SARS Coronavirus 2 by RT PCR NEGATIVE NEGATIVE Final    Comment: (NOTE) SARS-CoV-2 target nucleic acids are NOT DETECTED.  The SARS-CoV-2 RNA is generally detectable in upper respiratory specimens during the acute phase of infection. The lowest concentration of SARS-CoV-2 viral copies this assay can detect is 138 copies/mL. A negative result does not preclude SARS-Cov-2 infection and should not be used as the sole basis for treatment or other patient management decisions. A negative result may occur with  improper specimen collection/handling, submission of specimen other than nasopharyngeal swab, presence of viral mutation(s) within the areas targeted by this assay, and inadequate number of viral copies(<138 copies/mL). A negative result must be combined with clinical observations, patient history, and epidemiological information. The expected result is Negative.  Fact Sheet for Patients:  EntrepreneurPulse.com.au  Fact Sheet for Healthcare Providers:  IncredibleEmployment.be  This test is no t yet approved or cleared by the [place] FDA and  has been authorized for detection and/or diagnosis of SARS-CoV-2 by FDA under an Emergency Use Authorization (EUA). This EUA will remain  in effect (meaning this test can be used) for  the duration of the COVID-19 declaration under Section 564(b)(1) of the Act, 21 U.S.C.section 360bbb-3(b)(1), unless the authorization is terminated  or revoked sooner.       Influenza A by PCR NEGATIVE NEGATIVE Final   Influenza B by PCR NEGATIVE NEGATIVE Final    Comment: (NOTE) The Xpert Xpress SARS-CoV-2/FLU/RSV plus assay is intended as an aid in the diagnosis of influenza from Nasopharyngeal swab specimens and should not be used as a sole basis for treatment. Nasal washings and aspirates are unacceptable for Xpert Xpress SARS-CoV-2/FLU/RSV testing.  Fact Sheet for Patients: EntrepreneurPulse.com.au  Fact Sheet for Healthcare Providers: IncredibleEmployment.be  This test is not yet approved or cleared by the [place] FDA and has been authorized for detection and/or diagnosis of SARS-CoV-2 by FDA under an Emergency Use Authorization (EUA). This EUA will remain in effect (meaning this test can be used) for the duration of the COVID-19 declaration under Section 564(b)(1) of the Act, 21 U.S.C. section 360bbb-3(b)(1), unless the authorization is terminated or revoked.  Performed at [facility], [address] [place], [place] [address]     Radiology Reports CT Angio Chest PE W/Cm &/Or Wo Cm  Result Date: 07/24/2020 CLINICAL DATA:  Cough and shortness of breath for 2 weeks. EXAM: CT ANGIOGRAPHY CHEST WITH CONTRAST TECHNIQUE: Multidetector CT imaging of the chest was performed using the standard protocol during bolus administration of intravenous contrast. Multiplanar CT image reconstructions and MIPs were obtained to evaluate the vascular anatomy. CONTRAST:  186m OMNIPAQUE IOHEXOL 350 MG/ML SOLN COMPARISON:  Chest radiograph earlier today. FINDINGS: Cardiovascular: There are no filling defects within the pulmonary arteries to suggest pulmonary embolus. No aortic dissection or acute aortic abnormality upper normal heart size. No  pericardial effusion. Cardiac motion limits assessment for coronary artery calcifications. Mediastinum/Nodes: Small bilateral hilar lymph nodes are not enlarged by size criteria. Occasional small mediastinal nodes. There is an 11 mm right thyroid nodule. Not clinically significant; no follow-up imaging recommended (ref: J Am Coll Radiol. 2015 Feb;12(2): 143-50).No esophageal wall thickening. Lungs/Pleura: Moderate emphysema. There is mild smooth septal  thickening at the bases suggesting pulmonary edema. No significant pleural fluid. The subsegmental atelectasis or scarring in the lingula. In the posterior right upper lobe there is a 7 x 9 mm nodule (8 mm mean diameter) on image 52 of series 8. There also a small subpleural nodules in the right lower lobe, series 8, image 123. Upper Abdomen: Assessed on concurrent abdominal CT, reported separately. Musculoskeletal: Thoracic spondylosis. There are no acute or suspicious osseous abnormalities. Review of the MIP images confirms the above findings. IMPRESSION: 1. No pulmonary embolus. 2. Smooth septal thickening suggestive of pulmonary edema. Upper normal heart size. 3. Moderate emphysema. 4. Right upper lobe pulmonary nodule measuring 8 mm mean diameter, with additional small subpleural nodules in the right lower lobe. Non-contrast chest CT at 3-6 months is recommended. If the nodules are stable at time of repeat CT, then future CT at 18-24 months (from today's scan) is considered optional for low-risk patients, but is recommended for high-risk patients. This recommendation follows the consensus statement: Guidelines for Management of Incidental Pulmonary Nodules Detected on CT Images: From the Fleischner Society 2017; Radiology 2017; 284:228-243. Aortic Atherosclerosis (ICD10-I70.0) and Emphysema (ICD10-J43.9). Electronically Signed   By: Keith Rake M.D.   On: 07/24/2020 16:01   CT Abdomen Pelvis W Contrast  Result Date: 07/24/2020 CLINICAL DATA:  Acute  abdominal pain.  Nonlocalized GI bleed. EXAM: CT ABDOMEN AND PELVIS WITH CONTRAST TECHNIQUE: Multidetector CT imaging of the abdomen and pelvis was performed using the standard protocol following bolus administration of intravenous contrast. CONTRAST:  11m OMNIPAQUE IOHEXOL 350 MG/ML SOLN COMPARISON:  Most recent abdominal CT 08/13/2018 FINDINGS: Lower chest: Assessed on concurrent chest CT, reported separately. Septal thickening at the bases. Hepatobiliary: Focal fatty infiltration adjacent to the falciform ligament. No suspicious lesion. Clips in the gallbladder fossa postcholecystectomy. No biliary dilatation. Pancreas: No ductal dilatation or inflammation. Spleen: Normal in size without focal abnormality. Adrenals/Urinary Tract: Normal adrenal glands. No hydronephrosis or perinephric edema. Chronic extrarenal pelvis configuration of the left kidney. Homogeneous renal enhancement with symmetric excretion on delayed phase imaging. Tiny cortical hypodensity in the lower left kidney is too small to accurately characterize, not significantly changed from prior. Urinary bladder is distended without wall thickening. Stomach/Bowel: The stomach is decompressed. Normal positioning of the duodenum and ligament of Treitz. Short segment of prominent small bowel that are fluid-filled with air-fluid levels in the left upper quadrant. No associated bowel wall thickening or pneumatosis. No mesenteric edema. Remaining small bowel is decompressed. Appendix tentatively visualized and normal, for example series 2, image 55. mild colonic tortuosity with small volume of colonic stool. There is no colonic wall thickening or inflammation. Distal descending and sigmoid diverticulosis without diverticulitis. Vascular/Lymphatic: Moderate aortic and branch atherosclerosis. Mesenteric vessels are patent. The portal vein is patent. There is no mesenteric or portal venous gas. No enlarged lymph nodes in the abdomen or pelvis. Reproductive:  Status post hysterectomy. No adnexal masses. Other: Small fat containing umbilical hernia. No ascites. No free air. Mild dependent subcutaneous soft tissue edema. Musculoskeletal: There are no acute or suspicious osseous abnormalities. There are transverse process fractures on the right at L1 through L3, and on the left at L3, but are remote. IMPRESSION: 1. Short segment of prominent fluid-filled small bowel with air-fluid levels in the left upper quadrant, may represent enteritis or focal ileus. 2. Distal colonic diverticulosis without diverticulitis. 3. Distended urinary bladder without wall thickening. Aortic Atherosclerosis (ICD10-I70.0). Electronically Signed   By: MKeith RakeM.D.   On: 07/24/2020  16:09   DG Chest Port 1 View  Result Date: 07/24/2020 CLINICAL DATA:  Cough and shortness of breath for the past few weeks. EXAM: PORTABLE CHEST 1 VIEW COMPARISON:  Chest x-ray dated Jan 02, 2020. FINDINGS: The heart size and mediastinal contours are within normal limits. New diffuse interstitial thickening with Kerley B-lines at the lung bases. No focal consolidation, pleural effusion, or pneumothorax. No acute osseous abnormality. IMPRESSION: 1. Interstitial pulmonary edema. Electronically Signed   By: Titus Dubin M.D.   On: 07/24/2020 11:28     CBC Recent Labs  Lab 07/24/20 1101 07/25/20 0510  WBC 16.2* 10.1  HGB 6.5* 9.9*  HCT 23.5* 33.2*  PLT 471* 412*  MCV 79.7* 80.6  MCH 22.0* 24.0*  MCHC 27.7* 29.8*  RDW 18.2* 18.6*  LYMPHSABS  --  2.1  MONOABS  --  1.0  EOSABS  --  0.3  BASOSABS  --  0.1    Chemistries  Recent Labs  Lab 07/24/20 1101 07/25/20 0510  NA 135 136  K 3.7 3.0*  CL 104 100  CO2 21* 25  GLUCOSE 136* 83  BUN 15 18  CREATININE 0.98 0.98  CALCIUM 8.8* 8.9  MG  --  2.0  AST 47*  --   ALT 34  --   ALKPHOS 73  --   BILITOT 0.7  --    ------------------------------------------------------------------------------------------------------------------ No  results for input(s): CHOL, HDL, LDLCALC, TRIG, CHOLHDL, LDLDIRECT in the last 72 hours.  No results found for: HGBA1C ------------------------------------------------------------------------------------------------------------------ No results for input(s): TSH, T4TOTAL, T3FREE, THYROIDAB in the last 72 hours.  Invalid input(s): FREET3 ------------------------------------------------------------------------------------------------------------------ No results for input(s): VITAMINB12, FOLATE, FERRITIN, TIBC, IRON, RETICCTPCT in the last 72 hours.  Coagulation profile No results for input(s): INR, PROTIME in the last 168 hours.  No results for input(s): DDIMER in the last 72 hours.  Cardiac Enzymes No results for input(s): CKMB, TROPONINI, MYOGLOBIN in the last 168 hours.  Invalid input(s): CK ------------------------------------------------------------------------------------------------------------------    Component Value Date/Time   BNP 354.0 (H) 07/24/2020 1101     Roxan Hockey M.D on 07/25/2020 at 9:12 AM  Go to [email or web address] - for contact info  [facility] - Office  [phone]

## 2020-07-26 ENCOUNTER — Inpatient Hospital Stay (HOSPITAL_COMMUNITY): Payer: Medicare Other

## 2020-07-26 DIAGNOSIS — I5021 Acute systolic (congestive) heart failure: Secondary | ICD-10-CM

## 2020-07-26 DIAGNOSIS — D529 Folate deficiency anemia, unspecified: Secondary | ICD-10-CM | POA: Diagnosis present

## 2020-07-26 DIAGNOSIS — E538 Deficiency of other specified B group vitamins: Secondary | ICD-10-CM | POA: Diagnosis present

## 2020-07-26 DIAGNOSIS — J9601 Acute respiratory failure with hypoxia: Secondary | ICD-10-CM | POA: Diagnosis present

## 2020-07-26 DIAGNOSIS — E039 Hypothyroidism, unspecified: Secondary | ICD-10-CM | POA: Diagnosis present

## 2020-07-26 LAB — CBC
HCT: 33.8 % — ABNORMAL LOW (ref 36.0–46.0)
Hemoglobin: 9.4 g/dL — ABNORMAL LOW (ref 12.0–15.0)
MCH: 23.7 pg — ABNORMAL LOW (ref 26.0–34.0)
MCHC: 27.8 g/dL — ABNORMAL LOW (ref 30.0–36.0)
MCV: 85.1 fL (ref 80.0–100.0)
Platelets: 460 10*3/uL — ABNORMAL HIGH (ref 150–400)
RBC: 3.97 MIL/uL (ref 3.87–5.11)
RDW: 19.5 % — ABNORMAL HIGH (ref 11.5–15.5)
WBC: 9.4 10*3/uL (ref 4.0–10.5)
nRBC: 0 % (ref 0.0–0.2)

## 2020-07-26 LAB — ECHOCARDIOGRAM COMPLETE
AR max vel: 1.65 cm2
AV Area VTI: 1.79 cm2
AV Area mean vel: 1.88 cm2
AV Mean grad: 6.8 mmHg
AV Peak grad: 13.8 mmHg
Ao pk vel: 1.86 m/s
Area-P 1/2: 2.05 cm2
Height: 69 in
S' Lateral: 2.87 cm
Weight: 2948.87 oz

## 2020-07-26 LAB — BASIC METABOLIC PANEL
Anion gap: 10 (ref 5–15)
BUN: 29 mg/dL — ABNORMAL HIGH (ref 8–23)
CO2: 22 mmol/L (ref 22–32)
Calcium: 8.9 mg/dL (ref 8.9–10.3)
Chloride: 103 mmol/L (ref 98–111)
Creatinine, Ser: 1.48 mg/dL — ABNORMAL HIGH (ref 0.44–1.00)
GFR, Estimated: 39 mL/min — ABNORMAL LOW (ref >60)
Glucose, Bld: 97 mg/dL (ref 70–99)
Potassium: 4.3 mmol/L (ref 3.5–5.1)
Sodium: 135 mmol/L (ref 135–145)

## 2020-07-26 LAB — TSH: TSH: 8.275 u[IU]/mL — ABNORMAL HIGH (ref 0.350–4.500)

## 2020-07-26 LAB — FOLATE: Folate: 4.4 ng/mL — ABNORMAL LOW (ref >5.9)

## 2020-07-26 LAB — VITAMIN B12: Vitamin B-12: 260 pg/mL (ref 180–914)

## 2020-07-26 MED ORDER — DULOXETINE HCL 60 MG PO CPEP: 60.0000 mg | ORAL_CAPSULE | Freq: Two times a day (BID) | ORAL | 3 refills | Status: DC

## 2020-07-26 MED ORDER — FOLIC ACID 1 MG PO TABS
1.0000 mg | ORAL_TABLET | Freq: Every day | ORAL | Status: DC
  Administered 2020-07-26: 09:00:00 1 mg via ORAL
  Filled 2020-07-26: qty 1

## 2020-07-26 MED ORDER — IPRATROPIUM-ALBUTEROL 0.5-2.5 (3) MG/3ML IN SOLN: 3.0000 mL | RESPIRATORY_TRACT | 1 refills | Status: DC | PRN

## 2020-07-26 MED ORDER — STIOLTO RESPIMAT 2.5-2.5 MCG/ACT IN AERS: 2.0000 | INHALATION_SPRAY | Freq: Every day | RESPIRATORY_TRACT | 5 refills | Status: DC

## 2020-07-26 MED ORDER — FUROSEMIDE 40 MG PO TABS: 40.0000 mg | ORAL_TABLET | Freq: Every day | ORAL | 2 refills | Status: DC

## 2020-07-26 MED ORDER — ACETAMINOPHEN 325 MG PO TABS: 650.0000 mg | ORAL_TABLET | ORAL | 0 refills | Status: DC | PRN

## 2020-07-26 MED ORDER — DILTIAZEM HCL ER COATED BEADS 120 MG PO CP24: 120.0000 mg | ORAL_CAPSULE | Freq: Every day | ORAL | 2 refills | Status: DC

## 2020-07-26 MED ORDER — LEVOTHYROXINE SODIUM 25 MCG PO TABS
25.0000 ug | ORAL_TABLET | Freq: Every day | ORAL | Status: DC
  Administered 2020-07-26: 09:00:00 25 ug via ORAL
  Filled 2020-07-26: qty 1

## 2020-07-26 MED ORDER — LISINOPRIL 20 MG PO TABS: 20.0000 mg | ORAL_TABLET | Freq: Every day | ORAL | 2 refills | Status: DC

## 2020-07-26 MED ORDER — CYANOCOBALAMIN 1000 MCG/ML IJ SOLN
1000.0000 ug | Freq: Once | INTRAMUSCULAR | Status: AC
  Administered 2020-07-26: 12:00:00 1000 ug via INTRAMUSCULAR
  Filled 2020-07-26: qty 1

## 2020-07-26 MED ORDER — VITAMIN B-12 1000 MCG PO TABS: 1000.0000 ug | ORAL_TABLET | Freq: Every day | ORAL | 3 refills | Status: DC

## 2020-07-26 MED ORDER — ISOSORBIDE MONONITRATE ER 30 MG PO TB24: 30.0000 mg | ORAL_TABLET | Freq: Every day | ORAL | 2 refills | Status: DC

## 2020-07-26 MED ORDER — GUAIFENESIN-DM 100-10 MG/5ML PO SYRP
5.0000 mL | ORAL_SOLUTION | ORAL | Status: DC | PRN
  Administered 2020-07-26: 10:00:00 5 mL via ORAL
  Filled 2020-07-26: qty 5

## 2020-07-26 MED ORDER — LEVOTHYROXINE SODIUM 25 MCG PO TABS: 25.0000 ug | ORAL_TABLET | Freq: Every day | ORAL | 3 refills | Status: DC

## 2020-07-26 MED ORDER — FOLIC ACID 1 MG PO TABS: 1.0000 mg | ORAL_TABLET | Freq: Every day | ORAL | 3 refills | Status: DC

## 2020-07-26 MED ORDER — ALBUTEROL SULFATE HFA 108 (90 BASE) MCG/ACT IN AERS: 2.0000 | INHALATION_SPRAY | Freq: Four times a day (QID) | RESPIRATORY_TRACT | 1 refills | Status: DC | PRN

## 2020-07-26 NOTE — Discharge Instructions (Signed)
1)You Need oxygen at home at 2 L via nasal cannula continuously while awake and while asleep--- smoking or having open fires around oxygen can cause fire, significant injury and death  2)Repeat BMP in 1 week advised  3)Repeat TSH in 4 to 6 weeks---  4)Follow up with PCP within 1 week--for recheck and re-evaluation  5)You Need Repeat Y26 and Folic acid serum levels in 4 to 6 weeks

## 2020-07-26 NOTE — TOC Transition Note (Signed)
Transition of Care [facility]) - CM/SW Discharge Note   Patient Details  Name: Kayla Ryan MRN: [id number] Date of Birth: [birth year]  Transition of Care [facility]) CM/SW Contact:  Natasha Bence, LCSW Phone Number: 07/26/2020, 12:07 PM   Clinical Narrative:    CSW received order oxygen. CSW placed referral with [employer]. [employer] agreeable to provide O2 for patient. TOC signing off.   Final next level of care: Home/Self Care Barriers to Discharge: Barriers Resolved   Patient Goals and CMS Choice Patient states their goals for this hospitalization and ongoing recovery are:: Return home CMS Medicare.gov Compare Post Acute Care list provided to:: Patient Choice offered to / list presented to : Patient  Discharge Placement                    Patient and family notified of of transfer: 07/26/20  Discharge Plan and Services In-house Referral: Clinical Social Work Discharge Planning Services: CM Consult Post Acute Care Choice: NA          DME Arranged: Oxygen DME Agency: [employer] Date DME Agency Contacted: 07/26/20 Time DME Agency Contacted: 1207 Representative spoke with at DME Agency: Bobby Rumpf HH Arranged: NA [place] Agency: NA        Social Determinants of Health ([place]) Interventions     Readmission Risk Interventions No flowsheet data found.

## 2020-07-26 NOTE — Discharge Summary (Signed)
Kayla Ryan, is a 64 y.o. female  DOB [birth year]  MRN [id number].  Admission date:  07/24/2020  Admitting Physician  Roxan Hockey, MD  Discharge Date:  07/26/2020   Primary MD  Sandi Mariscal, MD  Recommendations for primary care physician for things to follow:   1)You Need oxygen at home at 2 L via nasal cannula continuously while awake and while asleep--- smoking or having open fires around oxygen can cause fire, significant injury and death  2)Repeat BMP in 1 week advised  3)Repeat TSH in 4 to 6 weeks---  4)Follow up with PCP within 1 week--for recheck and re-evaluation  5)You Need Repeat M21 and Folic acid serum levels in 4 to 6 weeks   Admission Diagnosis  SOB (shortness of breath) [R06.02] Anemia, unspecified type [D64.9] Acute on chronic anemia [D64.9] Atrial fibrillation with RVR ([place]) [I48.91]   Discharge Diagnosis  SOB (shortness of breath) [R06.02] Anemia, unspecified type [D64.9] Acute on chronic anemia [D64.9] Atrial fibrillation with RVR ([place]) [I48.91]    Principal Problem:   Acute on chronic anemia Active Problems:   Atrial fibrillation with RVR ([facility])   Acute respiratory failure with hypoxia (HCC)   Chronic obstructive pulmonary disease/Emphysema   Essential hypertension   Pulmonary nodules   Hypothyroidism   Carotid artery disease (HCC)   Chronic pain   History of Crohn's disease   Leukocytosis   Acute CHF (congestive heart failure) (HCC)   B12 and Folate deficiency   Folate deficiency anemia--B12 AND Folate deficiency      Past Medical History:  Diagnosis Date  . Arthritis   . Asthma   . Chest pain   . Chronic bronchitis   . COPD (chronic obstructive pulmonary disease) ([place])   . Crohn disease ([place])   . Emphysema   . Emphysema of lung ([place])   . Fibromyalgia   . Herpes   . Hyperlipidemia   . IBS (irritable bowel syndrome)   . Migraines   . Muscular  dystrophy ([place])   . Neck pain   . Plantar fasciitis   . Polymyositis ([place])   . Scoliosis     Past Surgical History:  Procedure Laterality Date  . ABDOMINAL HYSTERECTOMY    . bone spur    . CHOLECYSTECTOMY    . HERNIA REPAIR    . LEFT HEART CATHETERIZATION WITH CORONARY ANGIOGRAM N/A 11/07/2013   Procedure: LEFT HEART CATHETERIZATION WITH CORONARY ANGIOGRAM;  Surgeon: Lorretta Harp, MD;  Location: [facility] CATH LAB;  Service: Cardiovascular;  Laterality: N/A;  . MASTECTOMY PARTIAL / LUMPECTOMY    . OOPHORECTOMY    . ROTATOR CUFF REPAIR       HPI  from the history and physical done on the day of admission:    Patient Coming From: home  Chief Complaint: SOB  HPI: Kayla Ryan is a 64 y.o. female with a history of COPD/emphysema, Crohn's disease, fibromyalgia/chronic pain on chronic opiates, IBS, polymyositis.  Patient seen for 2 weeks.  She reports dry cough, fullness of her  lungs.  Shortness of breath worse with exertion and improved with rest.  Her oxygen saturation was in the mid 90s on EMS arrival.  They started her on 2 L nasal cannula with subjective improvement.  No other palliating or provoking fevers.  The patient is currently being treated for pneumonia and is on Augmentin.  She will finish out her antibiotics in 4 days.  No fevers, chills, nausea, vomiting.  Emergency Department Course: Chest x-ray shows pulmonary edema.  Blood work shows anemia with hemoglobin of 6.5.  In reviewing the chart, the patient has had intermittent anemia over the past several years.  She has received blood transfusions in the past.   Hospital Course:    Brief Summary:- 64 year-old female reformed smoker with past medical history relevant for COPD/emphysema, Crohn's disease, inflammatory myopathy, fibromyalgia and chronic anemia admitted on 07/24/2020 with acute on chronic anemia and concern for CHF exacerbation  A/p 1)New onset A. fib with RVR---on 07/25/2020 patient went into A. fib with  RVR, back in sinus rhythm after 2 doses of IV Cardizem followed by oral Cardizem and replacements of electrolytes -Discussed risk versus benefit of anticoagulation with patient at this time patient would like to hold off on full anticoagulation due to significant anemia requiring transfusion CHA2DS2- VASc score   is = 5 (age, gender, HTN, CHD and PAD (Carotid artery )   Which is  equal to = 7.2 % annual risk of stroke  This patients CHA2DS2-VASc Score and unadjusted Ischemic Stroke Rate (% per year) is equal to 7.2 % stroke rate/year from a score of 5 - -Elevated TSH noted,, started on levothyroxine, free T4 and T3 pending -Repeat echo with EF of 70 to 75%, no wall motion normalities, no diastolic dysfunction, and no significant valvular abnormalities -Discharged on Cardizem CD for rate control  2)New onset CHF--- ??  Suspect underlying chronic diastolic dysfunction CHF, prior echo from October 2020 with EF of 60 to 65%,, --- repeat echo as noted in #1 above =-Significantly improved with IV diuresis  -We will discharge home on oral Lasix -Continue isosorbide and lisinopril  3)Acute on chronic Anemia--- stool Hemoccult negative, but some degree of malabsorption in a patient with Crohn's colitis -Hemoglobin is up to 9.4 from 6.5 after transfusion of 2 units of PRBCs --Patient received transfusion in the past as well [facility] has been low, MCV has been borderline low with elevated RDW suspect some degree of iron deficiency -Folate is low and B12 is borderline low, folate and B12 replacement advised  4)COPD/Emphysema--- no acute exacerbation at this time, continue bronchodilators -- 5)Fibromyalgia chronic pain/inflammatory myopathy--- continue home regimen  6)HTN--continue lisinopril and isosorbide, Cardizem added for A. fib as above #1  7)History of Crohn's Colitis--- no acute flareup at this time outpatient follow-up advised  8)Pulmonary Nodules--- patient is a reformed smoker,  outpatient CT chest in about 18 months advised  9)Acute hypoxic respiratory failure--- this is secondary to combination of underlying COPD/emphysema and CHF----discharged on 2 L of oxygen via nasal cannula -  Disposition--- discharge home on home oxygen  Code Status :  -  Code Status: Full Code   Family Communication:   NA (patient is alert, awake and coherent)   Consults  :  na   Discharge Condition: Stable  Follow UP--PCP for repeat TSH   Follow-up Information    [facility] Follow up.   Why: O2 Contact information: [place] [id number] [phone]  Consults obtained - na  Diet and Activity recommendation:  As advised  Discharge Instructions     Discharge Instructions    Call MD for:  difficulty breathing, headache or visual disturbances   Complete by: As directed    Call MD for:  extreme fatigue   Complete by: As directed    Call MD for:  persistant dizziness or light-headedness   Complete by: As directed    Call MD for:  persistant nausea and vomiting   Complete by: As directed    Call MD for:  temperature >100.4   Complete by: As directed    Diet - low sodium heart healthy   Complete by: As directed    Discharge instructions   Complete by: As directed    1)You Need oxygen at home at 2 L via nasal cannula continuously while awake and while asleep--- smoking or having open fires around oxygen can cause fire, significant injury and death  2)Repeat BMP in 1 week advised  3)Repeat TSH in 4 to 6 weeks---  4)Follow up with PCP within 1 week--for recheck and re-evaluation  5)You Need Repeat E26 and Folic acid serum levels in 4 to 6 weeks   Increase activity slowly   Complete by: As directed        Discharge Medications     Allergies as of 07/26/2020      Reactions   Sulfa Antibiotics Shortness Of Breath, Swelling, Rash   Dilaudid [hydromorphone Hcl]    Makes me crazy   Metronidazole Diarrhea, Nausea And  Vomiting   Prednisone Other (See Comments)   angry angry   Cephalexin Diarrhea, Nausea And Vomiting   Methotrexate Derivatives Swelling, Rash   Morphine And Related Anxiety   "exreme mood swings"      Medication List    STOP taking these medications   amoxicillin-clavulanate 875-125 MG tablet Commonly known as: Augmentin     TAKE these medications   acetaminophen 325 MG tablet Commonly known as: TYLENOL Take 2 tablets (650 mg total) by mouth every 4 (four) hours as needed for headache or mild pain.   albuterol 108 (90 Base) MCG/ACT inhaler Commonly known as: ProAir HFA Inhale 2 puffs into the lungs every 6 (six) hours as needed for wheezing or shortness of breath.   cetirizine 10 MG tablet Commonly known as: ZYRTEC Take 10 mg by mouth daily.   Coenzyme Q10 10 MG capsule Take 40 mg by mouth daily. 4 capsules po qd   diclofenac sodium 1 % Gel Commonly known as: VOLTAREN Apply 2 g topically 4 (four) times daily as needed (for pain).   diltiazem 120 MG 24 hr capsule Commonly known as: Cardizem CD Take 1 capsule (120 mg total) by mouth daily.   doxepin 10 MG capsule Commonly known as: SINEQUAN Take 10 mg by mouth at bedtime.   DULoxetine 60 MG capsule Commonly known as: CYMBALTA Take 1 capsule (60 mg total) by mouth 2 (two) times daily.   folic acid 1 MG tablet Commonly known as: FOLVITE Take 1 tablet (1 mg total) by mouth daily. Start taking on: July 27, 2020   furosemide 40 MG tablet Commonly known as: Lasix Take 1 tablet (40 mg total) by mouth daily.   gabapentin 300 MG capsule Commonly known as: NEURONTIN Take 300 mg by mouth 3 (three) times daily.   gemfibrozil 600 MG tablet Commonly known as: LOPID Take 600 mg by mouth 2 (two) times daily before a meal.   ipratropium-albuterol 0.5-2.5 (3)  MG/3ML Soln Commonly known as: DUONEB Take 3 mLs by nebulization every 4 (four) hours as needed.   isosorbide mononitrate 30 MG 24 hr tablet Commonly known  as: IMDUR Take 1 tablet (30 mg total) by mouth daily.   levothyroxine 25 MCG tablet Commonly known as: SYNTHROID Take 1 tablet (25 mcg total) by mouth daily at 6 (six) AM. Start taking on: July 27, 2020   lisinopril 20 MG tablet Commonly known as: ZESTRIL Take 1 tablet (20 mg total) by mouth daily. Start taking on: July 27, 2020   magnesium hydroxide 400 MG/5ML suspension Commonly known as: MILK OF MAGNESIA Take 15 mLs by mouth at bedtime.   Melatonin 10 MG Tbcr Take 60 mg by mouth at bedtime.   multivitamin capsule Take 1 capsule by mouth daily.   Nitrostat 0.4 MG SL tablet Generic drug: nitroGLYCERIN Place 0.4 mg under the tongue every 15 (fifteen) minutes as needed for chest pain.   omeprazole 40 MG capsule Commonly known as: PRILOSEC Take 1 capsule (40 mg total) by mouth daily.   oxyCODONE 5 MG immediate release tablet Commonly known as: Oxy IR/ROXICODONE   Stiolto Respimat 2.5-2.5 MCG/ACT Aers Generic drug: Tiotropium Bromide-Olodaterol Inhale 2 puffs into the lungs daily.   topiramate 100 MG tablet Commonly known as: TOPAMAX Take 100 mg by mouth 2 (two) times daily.   valACYclovir 500 MG tablet Commonly known as: VALTREX Take 500 mg by mouth 2 (two) times daily.   vitamin B-12 1000 MCG tablet Commonly known as: CYANOCOBALAMIN Take 1 tablet (1,000 mcg total) by mouth daily.   Vitamin D (Ergocalciferol) 1.25 MG (50000 UNIT) Caps capsule Commonly known as: DRISDOL Take 1 Units by mouth every 7 (seven) days.   Xtampza ER 18 MG C12a Generic drug: oxyCODONE ER Take 1 capsule by mouth 2 (two) times daily.            Durable Medical Equipment  (From admission, onward)         Start     Ordered   07/26/20 1050  For home use only DME oxygen  Once       Comments: SATURATION QUALIFICATIONS: (This note is used to comply with regulatory documentation for home oxygen)   Patient Saturations on Room Air at Rest = 88 %   Patient Saturations on  Room Air while Ambulating = 84 %   Patient Saturations on 2 Liters of oxygen while Ambulating = 96 %      Patient needs continuous O2 at 2 L/min continuously via nasal cannula with humidifier, with gaseous portability and conserving device    Diagnosis--COPD  Question Answer Comment  Length of Need Lifetime   Mode or (Route) Nasal cannula   Liters per Minute 2   Oxygen conserving device Yes   Oxygen delivery system Gas      07/26/20 1050          Major procedures and Radiology Reports - PLEASE review detailed and final reports for all details, in brief -      CT Angio Chest PE W/Cm &/Or Wo Cm  Result Date: 07/24/2020 CLINICAL DATA:  Cough and shortness of breath for 2 weeks. EXAM: CT ANGIOGRAPHY CHEST WITH CONTRAST TECHNIQUE: Multidetector CT imaging of the chest was performed using the standard protocol during bolus administration of intravenous contrast. Multiplanar CT image reconstructions and MIPs were obtained to evaluate the vascular anatomy. CONTRAST:  135m OMNIPAQUE IOHEXOL 350 MG/ML SOLN COMPARISON:  Chest radiograph earlier today. FINDINGS: Cardiovascular: There are no  filling defects within the pulmonary arteries to suggest pulmonary embolus. No aortic dissection or acute aortic abnormality upper normal heart size. No pericardial effusion. Cardiac motion limits assessment for coronary artery calcifications. Mediastinum/Nodes: Small bilateral hilar lymph nodes are not enlarged by size criteria. Occasional small mediastinal nodes. There is an 11 mm right thyroid nodule. Not clinically significant; no follow-up imaging recommended (ref: J Am Coll Radiol. 2015 Feb;12(2): 143-50).No esophageal wall thickening. Lungs/Pleura: Moderate emphysema. There is mild smooth septal thickening at the bases suggesting pulmonary edema. No significant pleural fluid. The subsegmental atelectasis or scarring in the lingula. In the posterior right upper lobe there is a 7 x 9 mm nodule (8 mm mean  diameter) on image 52 of series 8. There also a small subpleural nodules in the right lower lobe, series 8, image 123. Upper Abdomen: Assessed on concurrent abdominal CT, reported separately. Musculoskeletal: Thoracic spondylosis. There are no acute or suspicious osseous abnormalities. Review of the MIP images confirms the above findings. IMPRESSION: 1. No pulmonary embolus. 2. Smooth septal thickening suggestive of pulmonary edema. Upper normal heart size. 3. Moderate emphysema. 4. Right upper lobe pulmonary nodule measuring 8 mm mean diameter, with additional small subpleural nodules in the right lower lobe. Non-contrast chest CT at 3-6 months is recommended. If the nodules are stable at time of repeat CT, then future CT at 18-24 months (from today's scan) is considered optional for low-risk patients, but is recommended for high-risk patients. This recommendation follows the consensus statement: Guidelines for Management of Incidental Pulmonary Nodules Detected on CT Images: From the Fleischner Society 2017; Radiology 2017; 284:228-243. Aortic Atherosclerosis (ICD10-I70.0) and Emphysema (ICD10-J43.9). Electronically Signed   By: Keith Rake M.D.   On: 07/24/2020 16:01   CT Abdomen Pelvis W Contrast  Result Date: 07/24/2020 CLINICAL DATA:  Acute abdominal pain.  Nonlocalized GI bleed. EXAM: CT ABDOMEN AND PELVIS WITH CONTRAST TECHNIQUE: Multidetector CT imaging of the abdomen and pelvis was performed using the standard protocol following bolus administration of intravenous contrast. CONTRAST:  13m OMNIPAQUE IOHEXOL 350 MG/ML SOLN COMPARISON:  Most recent abdominal CT 08/13/2018 FINDINGS: Lower chest: Assessed on concurrent chest CT, reported separately. Septal thickening at the bases. Hepatobiliary: Focal fatty infiltration adjacent to the falciform ligament. No suspicious lesion. Clips in the gallbladder fossa postcholecystectomy. No biliary dilatation. Pancreas: No ductal dilatation or inflammation.  Spleen: Normal in size without focal abnormality. Adrenals/Urinary Tract: Normal adrenal glands. No hydronephrosis or perinephric edema. Chronic extrarenal pelvis configuration of the left kidney. Homogeneous renal enhancement with symmetric excretion on delayed phase imaging. Tiny cortical hypodensity in the lower left kidney is too small to accurately characterize, not significantly changed from prior. Urinary bladder is distended without wall thickening. Stomach/Bowel: The stomach is decompressed. Normal positioning of the duodenum and ligament of Treitz. Short segment of prominent small bowel that are fluid-filled with air-fluid levels in the left upper quadrant. No associated bowel wall thickening or pneumatosis. No mesenteric edema. Remaining small bowel is decompressed. Appendix tentatively visualized and normal, for example series 2, image 55. mild colonic tortuosity with small volume of colonic stool. There is no colonic wall thickening or inflammation. Distal descending and sigmoid diverticulosis without diverticulitis. Vascular/Lymphatic: Moderate aortic and branch atherosclerosis. Mesenteric vessels are patent. The portal vein is patent. There is no mesenteric or portal venous gas. No enlarged lymph nodes in the abdomen or pelvis. Reproductive: Status post hysterectomy. No adnexal masses. Other: Small fat containing umbilical hernia. No ascites. No free air. Mild dependent subcutaneous soft tissue edema.  Musculoskeletal: There are no acute or suspicious osseous abnormalities. There are transverse process fractures on the right at L1 through L3, and on the left at L3, but are remote. IMPRESSION: 1. Short segment of prominent fluid-filled small bowel with air-fluid levels in the left upper quadrant, may represent enteritis or focal ileus. 2. Distal colonic diverticulosis without diverticulitis. 3. Distended urinary bladder without wall thickening. Aortic Atherosclerosis (ICD10-I70.0). Electronically Signed    By: Keith Rake M.D.   On: 07/24/2020 16:09   DG Chest Port 1 View  Result Date: 07/24/2020 CLINICAL DATA:  Cough and shortness of breath for the past few weeks. EXAM: PORTABLE CHEST 1 VIEW COMPARISON:  Chest x-ray dated Jan 02, 2020. FINDINGS: The heart size and mediastinal contours are within normal limits. New diffuse interstitial thickening with Kerley B-lines at the lung bases. No focal consolidation, pleural effusion, or pneumothorax. No acute osseous abnormality. IMPRESSION: 1. Interstitial pulmonary edema. Electronically Signed   By: Titus Dubin M.D.   On: 07/24/2020 11:28   ECHOCARDIOGRAM COMPLETE  Result Date: 07/26/2020    ECHOCARDIOGRAM REPORT   Patient Name:   PETRONELLA SHUFORD Avetisyan Date of Exam: 07/26/2020 Medical Rec #:  [id number]      Height:       69.0 in Accession #:    [id number]     Weight:       184.3 lb Date of Birth:  [birth year]      BSA:          1.995 m Patient Age:    12 years       BP:           140/64 mmHg Patient Gender: F              HR:           78 bpm. Exam Location:  [facility] Procedure: 2D Echo Indications:    CHF-Acute Systolic J19.41  History:        Patient has prior history of Echocardiogram examinations, most                 recent 06/08/2019. CHF, COPD, Arrythmias:Atrial Fibrillation,                 Signs/Symptoms:Syncope and Chest Pain; Risk Factors:Former                 Smoker, Hypertension and Dyslipidemia. Carotid Artery Disease.  Sonographer:    Leavy Cella RDCS (AE) Referring Phys: [place]  1. Left ventricular ejection fraction, by estimation, is 70 to 75%. The left ventricle has hyperdynamic function. The left ventricle has no regional wall motion abnormalities. There is mild left ventricular hypertrophy. Left ventricular diastolic parameters were normal.  2. Right ventricular systolic function is normal. The right ventricular size is normal.  3. The mitral valve is normal in structure. No evidence of mitral valve  regurgitation. No evidence of mitral stenosis.  4. The aortic valve is tricuspid. Aortic valve regurgitation is not visualized. No aortic stenosis is present.  5. The inferior vena cava is normal in size with greater than 50% respiratory variability, suggesting right atrial pressure of 3 mmHg. FINDINGS  Left Ventricle: Left ventricular ejection fraction, by estimation, is 70 to 75%. The left ventricle has hyperdynamic function. The left ventricle has no regional wall motion abnormalities. The left ventricular internal cavity size was normal in size. There is mild left ventricular hypertrophy. Left ventricular diastolic parameters were normal. Right Ventricle: The right ventricular  size is normal. No increase in right ventricular wall thickness. Right ventricular systolic function is normal. Left Atrium: Left atrial size was normal in size. Right Atrium: Right atrial size was normal in size. Pericardium: There is no evidence of pericardial effusion. Mitral Valve: The mitral valve is normal in structure. No evidence of mitral valve regurgitation. No evidence of mitral valve stenosis. Tricuspid Valve: The tricuspid valve is normal in structure. Tricuspid valve regurgitation is not demonstrated. No evidence of tricuspid stenosis. Aortic Valve: The aortic valve is tricuspid. Aortic valve regurgitation is not visualized. No aortic stenosis is present. Aortic valve mean gradient measures 6.8 mmHg. Aortic valve peak gradient measures 13.8 mmHg. Aortic valve area, by VTI measures 1.79  cm. Pulmonic Valve: The pulmonic valve was not well visualized. Pulmonic valve regurgitation is not visualized. No evidence of pulmonic stenosis. Aorta: The aortic root is normal in size and structure. Pulmonary Artery: Indeterminant PASP, inadequate TR jet. Venous: The inferior vena cava is normal in size with greater than 50% respiratory variability, suggesting right atrial pressure of 3 mmHg. IAS/Shunts: The interatrial septum was not well  visualized.  LEFT VENTRICLE PLAX 2D LVIDd:         3.99 cm  Diastology LVIDs:         2.87 cm  LV e' medial:    10.00 cm/s LV PW:         1.13 cm  LV E/e' medial:  9.0 LV IVS:        1.02 cm  LV e' lateral:   8.05 cm/s LVOT diam:     1.90 cm  LV E/e' lateral: 11.1 LV SV:         70 LV SV Index:   35 LVOT Area:     2.84 cm  RIGHT VENTRICLE RV S prime:     16.60 cm/s TAPSE (M-mode): 2.8 cm LEFT ATRIUM             Index       RIGHT ATRIUM           Index LA diam:        3.60 cm 1.80 cm/m  RA Area:     13.80 cm LA Vol (A2C):   42.4 ml 21.25 ml/m RA Volume:   37.60 ml  18.84 ml/m LA Vol (A4C):   40.0 ml 20.05 ml/m LA Biplane Vol: 42.9 ml 21.50 ml/m  AORTIC VALVE AV Area (Vmax):    1.65 cm AV Area (Vmean):   1.88 cm AV Area (VTI):     1.79 cm AV Vmax:           185.96 cm/s AV Vmean:          123.144 cm/s AV VTI:            0.390 m AV Peak Grad:      13.8 mmHg AV Mean Grad:      6.8 mmHg LVOT Vmax:         108.40 cm/s LVOT Vmean:        81.577 cm/s LVOT VTI:          0.247 m LVOT/AV VTI ratio: 0.63  AORTA Ao Root diam: 2.20 cm MITRAL VALVE MV Area (PHT): 2.05 cm    SHUNTS MV Decel Time: 370 msec    Systemic VTI:  0.25 m MV E velocity: 89.50 cm/s  Systemic Diam: 1.90 cm MV A velocity: 86.10 cm/s MV E/A ratio:  1.04 Carlyle Dolly MD Electronically signed by Carlyle Dolly MD  Signature Date/Time: 07/26/2020/12:24:16 PM    Final     Micro Results     Recent Results (from the past 240 hour(s))  Resp Panel by RT-PCR (Flu A&B, Covid) Nasopharyngeal Swab     Status: None   Collection Time: 07/24/20 12:03 PM   Specimen: Nasopharyngeal Swab; Nasopharyngeal(NP) swabs in vial transport medium  Result Value Ref Range Status   SARS Coronavirus 2 by RT PCR NEGATIVE NEGATIVE Final    Comment: (NOTE) SARS-CoV-2 target nucleic acids are NOT DETECTED.  The SARS-CoV-2 RNA is generally detectable in upper respiratory specimens during the acute phase of infection. The lowest concentration of SARS-CoV-2 viral  copies this assay can detect is 138 copies/mL. A negative result does not preclude SARS-Cov-2 infection and should not be used as the sole basis for treatment or other patient management decisions. A negative result may occur with  improper specimen collection/handling, submission of specimen other than nasopharyngeal swab, presence of viral mutation(s) within the areas targeted by this assay, and inadequate number of viral copies(<138 copies/mL). A negative result must be combined with clinical observations, patient history, and epidemiological information. The expected result is Negative.  Fact Sheet for Patients:  EntrepreneurPulse.com.au  Fact Sheet for Healthcare Providers:  IncredibleEmployment.be  This test is no t yet approved or cleared by the [place] FDA and  has been authorized for detection and/or diagnosis of SARS-CoV-2 by FDA under an Emergency Use Authorization (EUA). This EUA will remain  in effect (meaning this test can be used) for the duration of the COVID-19 declaration under Section 564(b)(1) of the Act, 21 U.S.C.section 360bbb-3(b)(1), unless the authorization is terminated  or revoked sooner.       Influenza A by PCR NEGATIVE NEGATIVE Final   Influenza B by PCR NEGATIVE NEGATIVE Final    Comment: (NOTE) The Xpert Xpress SARS-CoV-2/FLU/RSV plus assay is intended as an aid in the diagnosis of influenza from Nasopharyngeal swab specimens and should not be used as a sole basis for treatment. Nasal washings and aspirates are unacceptable for Xpert Xpress SARS-CoV-2/FLU/RSV testing.  Fact Sheet for Patients: EntrepreneurPulse.com.au  Fact Sheet for Healthcare Providers: IncredibleEmployment.be  This test is not yet approved or cleared by the [place] FDA and has been authorized for detection and/or diagnosis of SARS-CoV-2 by FDA under an Emergency Use Authorization (EUA). This  EUA will remain in effect (meaning this test can be used) for the duration of the COVID-19 declaration under Section 564(b)(1) of the Act, 21 U.S.C. section 360bbb-3(b)(1), unless the authorization is terminated or revoked.  Performed at [facility], [address], [place], [place] [address]     Today   Subjective    Artice Holohan today has no new complaints --Patient found to be hypoxic with ambulation requires 2 L of oxygen via nasal cannula -No chest pains          Patient has been seen and examined prior to discharge   Objective   Blood pressure (!) 141/75, pulse 78, temperature 97.7 F (36.5 C), temperature source Oral, resp. rate 18, height 5' 9"  (1.753 m), weight 83.6 kg, SpO2 95 %.   Intake/Output Summary (Last 24 hours) at 07/26/2020 1237 Last data filed at 07/25/2020 1700 Gross per 24 hour  Intake 617.63 ml  Output --  Net 617.63 ml    Exam Gen:- Awake Alert,  In no apparent distress  HEENT:- [place].AT, No sclera icterus Nose- [place] 2L/min Neck-Supple Neck,No JVD,.  Lungs-improved air movement, no wheezing CV- S1, S2 normal,  RRR, back in sinus rhythm Abd-  +ve B.Sounds, Abd Soft, No tenderness,    Extremity/Skin:- No  edema, pedal pulses present  Psych-affect is appropriate, oriented x3 Neuro-no new focal deficits, no tremors   Data Review   CBC w Diff:  Lab Results  Component Value Date   WBC 9.4 07/26/2020   HGB 9.4 (L) 07/26/2020   HCT 33.8 (L) 07/26/2020   PLT 460 (H) 07/26/2020   LYMPHOPCT 21 07/25/2020   MONOPCT 10 07/25/2020   EOSPCT 3 07/25/2020   BASOPCT 1 07/25/2020    CMP:  Lab Results  Component Value Date   NA 135 07/26/2020   K 4.3 07/26/2020   CL 103 07/26/2020   CO2 22 07/26/2020   BUN 29 (H) 07/26/2020   CREATININE 1.48 (H) 07/26/2020   CREATININE 0.88 10/30/2013   PROT 7.7 07/24/2020   ALBUMIN 4.1 07/24/2020   BILITOT 0.7 07/24/2020   ALKPHOS 73 07/24/2020   AST 47 (H) 07/24/2020   ALT 34 07/24/2020  . Total Discharge  time is about 33 minutes  Roxan Hockey M.D on 07/26/2020 at 12:37 PM  Go to [email or web address] -  for contact info  [facility] - Office  [phone]

## 2020-07-26 NOTE — Progress Notes (Signed)
Nsg Discharge Note  Admit Date:  07/24/2020 Discharge date: 07/26/2020   Kayla Ryan to be D/C'd Home per MD order.  AVS completed.  Copy for chart, and copy for patient signed, and dated. Patient/caregiver able to verbalize understanding. Patients IV removed. Patient discharged with home oxygen. Oxygen tank delivered to patients room patient discharged with oxygen tank on 2 liters nasal canula. discharge paper work given and reviewed. Patient left via cab.  Discharge Medication: Allergies as of 07/26/2020      Reactions   Sulfa Antibiotics Shortness Of Breath, Swelling, Rash   Dilaudid [hydromorphone Hcl]    Makes me crazy   Metronidazole Diarrhea, Nausea And Vomiting   Prednisone Other (See Comments)   angry angry   Cephalexin Diarrhea, Nausea And Vomiting   Methotrexate Derivatives Swelling, Rash   Morphine And Related Anxiety   "exreme mood swings"      Medication List    STOP taking these medications   amoxicillin-clavulanate 875-125 MG tablet Commonly known as: Augmentin     TAKE these medications   acetaminophen 325 MG tablet Commonly known as: TYLENOL Take 2 tablets (650 mg total) by mouth every 4 (four) hours as needed for headache or mild pain.   albuterol 108 (90 Base) MCG/ACT inhaler Commonly known as: ProAir HFA Inhale 2 puffs into the lungs every 6 (six) hours as needed for wheezing or shortness of breath.   cetirizine 10 MG tablet Commonly known as: ZYRTEC Take 10 mg by mouth daily.   Coenzyme Q10 10 MG capsule Take 40 mg by mouth daily. 4 capsules po qd   diclofenac sodium 1 % Gel Commonly known as: VOLTAREN Apply 2 g topically 4 (four) times daily as needed (for pain).   diltiazem 120 MG 24 hr capsule Commonly known as: Cardizem CD Take 1 capsule (120 mg total) by mouth daily.   doxepin 10 MG capsule Commonly known as: SINEQUAN Take 10 mg by mouth at bedtime.   DULoxetine 60 MG capsule Commonly known as: CYMBALTA Take 1 capsule (60 mg  total) by mouth 2 (two) times daily.   folic acid 1 MG tablet Commonly known as: FOLVITE Take 1 tablet (1 mg total) by mouth daily. Start taking on: July 27, 2020   furosemide 40 MG tablet Commonly known as: Lasix Take 1 tablet (40 mg total) by mouth daily.   gabapentin 300 MG capsule Commonly known as: NEURONTIN Take 300 mg by mouth 3 (three) times daily.   gemfibrozil 600 MG tablet Commonly known as: LOPID Take 600 mg by mouth 2 (two) times daily before a meal.   ipratropium-albuterol 0.5-2.5 (3) MG/3ML Soln Commonly known as: DUONEB Take 3 mLs by nebulization every 4 (four) hours as needed.   isosorbide mononitrate 30 MG 24 hr tablet Commonly known as: IMDUR Take 1 tablet (30 mg total) by mouth daily.   levothyroxine 25 MCG tablet Commonly known as: SYNTHROID Take 1 tablet (25 mcg total) by mouth daily at 6 (six) AM. Start taking on: July 27, 2020   lisinopril 20 MG tablet Commonly known as: ZESTRIL Take 1 tablet (20 mg total) by mouth daily. Start taking on: July 27, 2020   magnesium hydroxide 400 MG/5ML suspension Commonly known as: MILK OF MAGNESIA Take 15 mLs by mouth at bedtime.   Melatonin 10 MG Tbcr Take 60 mg by mouth at bedtime.   multivitamin capsule Take 1 capsule by mouth daily.   Nitrostat 0.4 MG SL tablet Generic drug: nitroGLYCERIN Place 0.4 mg under  the tongue every 15 (fifteen) minutes as needed for chest pain.   omeprazole 40 MG capsule Commonly known as: PRILOSEC Take 1 capsule (40 mg total) by mouth daily.   oxyCODONE 5 MG immediate release tablet Commonly known as: Oxy IR/ROXICODONE   Stiolto Respimat 2.5-2.5 MCG/ACT Aers Generic drug: Tiotropium Bromide-Olodaterol Inhale 2 puffs into the lungs daily.   topiramate 100 MG tablet Commonly known as: TOPAMAX Take 100 mg by mouth 2 (two) times daily.   valACYclovir 500 MG tablet Commonly known as: VALTREX Take 500 mg by mouth 2 (two) times daily.   vitamin B-12 1000  MCG tablet Commonly known as: CYANOCOBALAMIN Take 1 tablet (1,000 mcg total) by mouth daily.   Vitamin D (Ergocalciferol) 1.25 MG (50000 UNIT) Caps capsule Commonly known as: DRISDOL Take 1 Units by mouth every 7 (seven) days.   Xtampza ER 18 MG C12a Generic drug: oxyCODONE ER Take 1 capsule by mouth 2 (two) times daily.            Durable Medical Equipment  (From admission, onward)         Start     Ordered   07/26/20 1050  For home use only DME oxygen  Once       Comments: SATURATION QUALIFICATIONS: (This note is used to comply with regulatory documentation for home oxygen)   Patient Saturations on Room Air at Rest = 88 %   Patient Saturations on Room Air while Ambulating = 84 %   Patient Saturations on 2 Liters of oxygen while Ambulating = 96 %      Patient needs continuous O2 at 2 L/min continuously via nasal cannula with humidifier, with gaseous portability and conserving device    Diagnosis--COPD  Question Answer Comment  Length of Need Lifetime   Mode or (Route) Nasal cannula   Liters per Minute 2   Oxygen conserving device Yes   Oxygen delivery system Gas      07/26/20 1050          Discharge Assessment: Vitals:   07/26/20 0901 07/26/20 1216  BP: 137/79 (!) 141/75  Pulse:    Resp:    Temp:    SpO2: 95%    Skin clean, dry and intact without evidence of skin break down, no evidence of skin tears noted. IV catheter discontinued intact. Site without signs and symptoms of complications - no redness or edema noted at insertion site, patient denies c/o pain - only slight tenderness at site.  Dressing with slight pressure applied.  D/c Instructions-Education: Discharge instructions given to patient/family with verbalized understanding. D/c education completed with patient/family including follow up instructions, medication list, d/c activities limitations if indicated, with other d/c instructions as indicated by MD - patient able to verbalize  understanding, all questions fully answered. Patient instructed to return to ED, call 911, or call MD for any changes in condition.  Patient escorted via [place], and D/C home via private auto.  Zenaida Deed, RN 07/26/2020 3:20 PM

## 2020-07-26 NOTE — Progress Notes (Signed)
*  PRELIMINARY RESULTS* Echocardiogram 2D Echocardiogram has been performed.  Leavy Cella 07/26/2020, 8:44 AM

## 2020-07-26 NOTE — Progress Notes (Signed)
    SATURATION QUALIFICATIONS: (This note is used to comply with regulatory documentation for home oxygen)   Patient Saturations on Room Air at Rest = 88 %   Patient Saturations on Room Air while Ambulating = 84 %   Patient Saturations on 2 Liters of oxygen while Ambulating = 96 %      Patient needs continuous O2 at 2 L/min continuously via nasal cannula with humidifier, with gaseous portability and conserving device    Diagnosis--COPD  Roxan Hockey, MD

## 2020-08-13 ENCOUNTER — Telehealth: Payer: Self-pay | Admitting: Emergency Medicine

## 2020-08-13 ENCOUNTER — Ambulatory Visit (HOSPITAL_COMMUNITY): Admission: RE | Admit: 2020-08-13 | Payer: 59 | Source: Ambulatory Visit

## 2020-08-13 NOTE — Telephone Encounter (Signed)
Called told patient to keep follow up with Dr. Lamonte Sakai but didn't need to go to her CT today. I canceled the CT for today at [facility]  Patient voiced understanding,  Nothing further needed at this time.

## 2020-08-13 NOTE — Telephone Encounter (Signed)
ATC patient.  LVM to return call regarding CT scan.  Will await return call.

## 2020-08-13 NOTE — Telephone Encounter (Signed)
Patient is returning phone call. Patient phone number is [phone]. May leave detailed message on voicemail.

## 2020-08-13 NOTE — Telephone Encounter (Signed)
Patient had CTA while in hospital on 07/24/20, she is scheduled for HRCT today at 3pm that you ordered and she is wanting to know if she still needs this done. Please advised

## 2020-08-13 NOTE — Telephone Encounter (Signed)
I do not believe that will be clinically necessary.  I do believe the patient needs to have a follow-up in office with Dr. Lamonte Sakai.  Wyn Quaker, FNP

## 2020-08-15 ENCOUNTER — Telehealth: Payer: Self-pay | Admitting: Internal Medicine

## 2020-08-15 NOTE — Telephone Encounter (Signed)
C/o severe cough on lisinopril  - seeing [facility] 1/10  On phone extremely harsh upper airway cough/ no conversational sob rec  D/c lisinopril (took last dose am 08/15/2020 ) deslym 2 tsp qq 12h supplemented with oxy 5 Ov 1/10 to recheck cough and bp off acei and replace with arb

## 2020-08-17 ENCOUNTER — Inpatient Hospital Stay (HOSPITAL_COMMUNITY)
Admission: EM | Admit: 2020-08-17 | Discharge: 2020-08-20 | DRG: 683 | Disposition: A | Discharge status: Home / Self Care | Payer: Medicare Other | Attending: Internal Medicine | Admitting: Internal Medicine

## 2020-08-17 ENCOUNTER — Emergency Department (HOSPITAL_COMMUNITY): Payer: Medicare Other

## 2020-08-17 ENCOUNTER — Other Ambulatory Visit: Payer: Self-pay

## 2020-08-17 ENCOUNTER — Ambulatory Visit: Payer: Medicare Other | Admitting: Emergency Medicine

## 2020-08-17 ENCOUNTER — Encounter (HOSPITAL_COMMUNITY): Payer: Self-pay | Admitting: Emergency Medicine

## 2020-08-17 DIAGNOSIS — Z881 Allergy status to other antibiotic agents status: Secondary | ICD-10-CM

## 2020-08-17 DIAGNOSIS — M797 Fibromyalgia: Secondary | ICD-10-CM | POA: Diagnosis present

## 2020-08-17 DIAGNOSIS — M1990 Unspecified osteoarthritis, unspecified site: Secondary | ICD-10-CM | POA: Diagnosis present

## 2020-08-17 DIAGNOSIS — E875 Hyperkalemia: Secondary | ICD-10-CM | POA: Diagnosis not present

## 2020-08-17 DIAGNOSIS — J309 Allergic rhinitis, unspecified: Secondary | ICD-10-CM | POA: Diagnosis present

## 2020-08-17 DIAGNOSIS — Z90710 Acquired absence of both cervix and uterus: Secondary | ICD-10-CM

## 2020-08-17 DIAGNOSIS — N179 Acute kidney failure, unspecified: Secondary | ICD-10-CM | POA: Diagnosis not present

## 2020-08-17 DIAGNOSIS — B009 Herpesviral infection, unspecified: Secondary | ICD-10-CM | POA: Diagnosis present

## 2020-08-17 DIAGNOSIS — G7100 Muscular dystrophy, unspecified: Secondary | ICD-10-CM | POA: Diagnosis present

## 2020-08-17 DIAGNOSIS — D75839 Thrombocytosis, unspecified: Secondary | ICD-10-CM | POA: Diagnosis present

## 2020-08-17 DIAGNOSIS — K589 Irritable bowel syndrome without diarrhea: Secondary | ICD-10-CM | POA: Diagnosis present

## 2020-08-17 DIAGNOSIS — Z888 Allergy status to other drugs, medicaments and biological substances status: Secondary | ICD-10-CM

## 2020-08-17 DIAGNOSIS — Z79890 Hormone replacement therapy (postmenopausal): Secondary | ICD-10-CM

## 2020-08-17 DIAGNOSIS — M419 Scoliosis, unspecified: Secondary | ICD-10-CM | POA: Diagnosis present

## 2020-08-17 DIAGNOSIS — E785 Hyperlipidemia, unspecified: Secondary | ICD-10-CM | POA: Diagnosis present

## 2020-08-17 DIAGNOSIS — Z90722 Acquired absence of ovaries, bilateral: Secondary | ICD-10-CM

## 2020-08-17 DIAGNOSIS — Z79891 Long term (current) use of opiate analgesic: Secondary | ICD-10-CM

## 2020-08-17 DIAGNOSIS — J449 Chronic obstructive pulmonary disease, unspecified: Secondary | ICD-10-CM | POA: Diagnosis present

## 2020-08-17 DIAGNOSIS — Z79899 Other long term (current) drug therapy: Secondary | ICD-10-CM

## 2020-08-17 DIAGNOSIS — F32A Depression, unspecified: Secondary | ICD-10-CM | POA: Diagnosis present

## 2020-08-17 DIAGNOSIS — Z20822 Contact with and (suspected) exposure to covid-19: Secondary | ICD-10-CM | POA: Diagnosis present

## 2020-08-17 DIAGNOSIS — Z882 Allergy status to sulfonamides status: Secondary | ICD-10-CM

## 2020-08-17 DIAGNOSIS — E871 Hypo-osmolality and hyponatremia: Secondary | ICD-10-CM | POA: Diagnosis present

## 2020-08-17 DIAGNOSIS — W19XXXA Unspecified fall, initial encounter: Secondary | ICD-10-CM | POA: Diagnosis present

## 2020-08-17 DIAGNOSIS — F419 Anxiety disorder, unspecified: Secondary | ICD-10-CM | POA: Diagnosis present

## 2020-08-17 DIAGNOSIS — S161XXA Strain of muscle, fascia and tendon at neck level, initial encounter: Secondary | ICD-10-CM | POA: Diagnosis present

## 2020-08-17 DIAGNOSIS — Z87891 Personal history of nicotine dependence: Secondary | ICD-10-CM

## 2020-08-17 DIAGNOSIS — Z885 Allergy status to narcotic agent status: Secondary | ICD-10-CM

## 2020-08-17 DIAGNOSIS — G629 Polyneuropathy, unspecified: Secondary | ICD-10-CM | POA: Diagnosis present

## 2020-08-17 DIAGNOSIS — Z825 Family history of asthma and other chronic lower respiratory diseases: Secondary | ICD-10-CM

## 2020-08-17 DIAGNOSIS — Z9049 Acquired absence of other specified parts of digestive tract: Secondary | ICD-10-CM

## 2020-08-17 DIAGNOSIS — E039 Hypothyroidism, unspecified: Secondary | ICD-10-CM | POA: Diagnosis present

## 2020-08-17 DIAGNOSIS — D72829 Elevated white blood cell count, unspecified: Secondary | ICD-10-CM | POA: Diagnosis present

## 2020-08-17 LAB — BASIC METABOLIC PANEL
Anion gap: 11 (ref 5–15)
BUN: 52 mg/dL — ABNORMAL HIGH (ref 8–23)
CO2: 23 mmol/L (ref 22–32)
Calcium: 8.4 mg/dL — ABNORMAL LOW (ref 8.9–10.3)
Chloride: 92 mmol/L — ABNORMAL LOW (ref 98–111)
Creatinine, Ser: 3.26 mg/dL — ABNORMAL HIGH (ref 0.44–1.00)
GFR, Estimated: 15 mL/min — ABNORMAL LOW (ref >60)
Glucose, Bld: 104 mg/dL — ABNORMAL HIGH (ref 70–99)
Potassium: >7.5 mmol/L (ref 3.5–5.1)
Sodium: 126 mmol/L — ABNORMAL LOW (ref 135–145)

## 2020-08-17 LAB — CBC
HCT: 31.2 % — ABNORMAL LOW (ref 36.0–46.0)
Hemoglobin: 9.4 g/dL — ABNORMAL LOW (ref 12.0–15.0)
MCH: 25.6 pg — ABNORMAL LOW (ref 26.0–34.0)
MCHC: 30.1 g/dL (ref 30.0–36.0)
MCV: 85 fL (ref 80.0–100.0)
Platelets: 429 10*3/uL — ABNORMAL HIGH (ref 150–400)
RBC: 3.67 MIL/uL — ABNORMAL LOW (ref 3.87–5.11)
RDW: 21.6 % — ABNORMAL HIGH (ref 11.5–15.5)
WBC: 11.5 10*3/uL — ABNORMAL HIGH (ref 4.0–10.5)
nRBC: 0 % (ref 0.0–0.2)

## 2020-08-17 LAB — RESP PANEL BY RT-PCR (FLU A&B, COVID) ARPGX2
Influenza A by PCR: NEGATIVE
Influenza B by PCR: NEGATIVE
SARS Coronavirus 2 by RT PCR: NEGATIVE

## 2020-08-17 LAB — TROPONIN I (HIGH SENSITIVITY): Troponin I (High Sensitivity): 27 ng/L — ABNORMAL HIGH (ref <18)

## 2020-08-17 MED ORDER — PREDNISONE 50 MG PO TABS: 60.0000 mg | ORAL_TABLET | Freq: Once | ORAL | Status: DC

## 2020-08-17 MED ORDER — AEROCHAMBER PLUS FLO-VU MEDIUM MISC: 1.0000 | Freq: Once | Status: DC

## 2020-08-17 MED ORDER — ONDANSETRON HCL 4 MG/2ML IJ SOLN
4.0000 mg | Freq: Once | INTRAMUSCULAR | Status: AC
  Administered 2020-08-17: 4 mg via INTRAVENOUS
  Filled 2020-08-17: qty 2

## 2020-08-17 MED ORDER — DOXYCYCLINE HYCLATE 100 MG PO TABS: 100.0000 mg | ORAL_TABLET | Freq: Once | ORAL | Status: DC

## 2020-08-17 MED ORDER — ALBUTEROL SULFATE HFA 108 (90 BASE) MCG/ACT IN AERS: 2.0000 | INHALATION_SPRAY | RESPIRATORY_TRACT | Status: DC | PRN

## 2020-08-17 MED ORDER — FENTANYL CITRATE (PF) 100 MCG/2ML IJ SOLN
50.0000 ug | Freq: Once | INTRAMUSCULAR | Status: AC
  Administered 2020-08-17: 50 ug via INTRAVENOUS
  Filled 2020-08-17: qty 2

## 2020-08-17 MED ORDER — SODIUM CHLORIDE 0.9 % IV BOLUS
1000.0000 mL | Freq: Once | INTRAVENOUS | Status: AC
  Administered 2020-08-17: 1000 mL via INTRAVENOUS

## 2020-08-17 MED ORDER — ALBUTEROL SULFATE HFA 108 (90 BASE) MCG/ACT IN AERS: 2.0000 | INHALATION_SPRAY | Freq: Once | RESPIRATORY_TRACT | Status: DC

## 2020-08-17 NOTE — ED Notes (Signed)
Pt transported to CT/Xray.

## 2020-08-17 NOTE — ED Triage Notes (Signed)
Pt brought in by EMS with c/o SOB and Chest pain that started after she received her Covid Booster shot yesterday. Pt fell 2 days ago and also c/o neck and shoulder pain. Pt recently diagnosed with CHF. Per EMS, pt's HR was 48 and pt in Junctional Rhythm.

## 2020-08-17 NOTE — ED Notes (Signed)
Pt placed on purewick per request.

## 2020-08-17 NOTE — Telephone Encounter (Signed)
OV was cancelled.

## 2020-08-17 NOTE — ED Notes (Signed)
Pt returned from CT/Xray.

## 2020-08-17 NOTE — ED Provider Notes (Signed)
[facility] EMERGENCY DEPARTMENT Provider Note   CSN: [id number] Arrival date & time: 08/17/20  2058     History Chief Complaint  Patient presents with   multiple complaints    Kayla Ryan is a 65 y.o. female.  Pt presents to the ED today with sob and cp.  Pt also c/o neck pain.  Pt said she has had cp for the past few days.  She had her Covid booster shot on Saturday, 1/8.  She said she passed out at home later that same night and hit her neck.  She has had pain to her neck since then.  Pt feels sob with exertion.  No f/c.         Past Medical History:  Diagnosis Date   Arthritis    Asthma    Chest pain    Chronic bronchitis    COPD (chronic obstructive pulmonary disease) (HCC)    Crohn disease ([place])    Emphysema    Emphysema of lung (HCC)    Fibromyalgia    Herpes    Hyperlipidemia    IBS (irritable bowel syndrome)    Migraines    Muscular dystrophy ([place])    Neck pain    Plantar fasciitis    Polymyositis ([place])    Scoliosis     Patient Active Problem List   Diagnosis Date Noted   AKI (acute kidney injury) ([place]) 08/18/2020   Hypothyroidism 07/26/2020   Acute respiratory failure with hypoxia ([place]) 07/26/2020   B12 and Folate deficiency 07/26/2020   Folate deficiency anemia--B12 AND Folate deficiency 07/26/2020   Atrial fibrillation with RVR ([place]) 07/25/2020   Acute on chronic anemia 07/24/2020   Leukocytosis 07/24/2020   Pulmonary nodules 07/24/2020   Acute CHF (congestive heart failure) ([place]) 07/24/2020   Dyspnea 01/31/2020   Anemia 01/03/2020   Syncope 06/08/2019   Hypokalemia 06/08/2019   Trigger finger, acquired 11/03/2017   Chronic obstructive pulmonary disease/Emphysema 08/23/2017   Chronic pain 08/23/2017   History of Crohn's disease 08/23/2017   Myopathy 08/23/2017   Right shoulder injury, subsequent encounter 08/23/2017   Primary insomnia 02/01/2017   Esophageal dysphagia 10/06/2016   Essential  hypertension 07/17/2016   Iron deficiency anemia 03/25/2016   History of Bell's palsy 03/03/2015   Nuclear sclerosis of both eyes 03/03/2015   Allergic rhinitis 05/18/2014   Anxiety 05/18/2014   Arthritis 05/18/2014   Asthma 05/18/2014   Chronic constipation 05/18/2014   Dermatomyositis ([place]) 05/18/2014   Migraine without status migrainosus, not intractable 05/18/2014   Rectocele 05/18/2014   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   Peripheral neuropathy 05/18/2014   Chest pain 10/30/2013   Hyperlipidemia 10/30/2013   Carotid artery disease ([place]) 10/30/2013    Past Surgical History:  Procedure Laterality Date   ABDOMINAL HYSTERECTOMY     bone spur     CHOLECYSTECTOMY     HERNIA REPAIR     LEFT HEART CATHETERIZATION WITH CORONARY ANGIOGRAM N/A 11/07/2013   Procedure: LEFT HEART CATHETERIZATION WITH CORONARY ANGIOGRAM;  Surgeon: Lorretta Harp, MD;  Location: [facility] CATH LAB;  Service: Cardiovascular;  Laterality: N/A;   MASTECTOMY PARTIAL / LUMPECTOMY     OOPHORECTOMY     ROTATOR CUFF REPAIR       OB History   No obstetric history on file.     Family History  Problem Relation Age of Onset   Lung cancer Mother    COPD Father    Lung cancer Father    Lymphoma Father  Social History   Tobacco Use   Smoking status: Former Smoker    Packs/day: 1.00    Years: 49.00    Pack years: 49.00    Types: Cigarettes    Start date: 08/08/1966    Quit date: 07/03/2011    Years since quitting: 9.1   Smokeless tobacco: Never Used   Tobacco comment: Originally quit 2007 for 5 years then restarted in 2012 for 3 months then quit  Vaping Use   Vaping Use: Never used  Substance Use Topics   Alcohol use: No   Drug use: No    Home Medications Prior to Admission medications   Medication Sig Start Date End Date Taking? Authorizing Provider  acetaminophen (TYLENOL) 325 MG tablet Take 2 tablets (650 mg total) by mouth every 4 (four)  hours as needed for headache or mild pain. 07/26/20   Roxan Hockey, MD  albuterol (PROAIR HFA) 108 (90 Base) MCG/ACT inhaler Inhale 2 puffs into the lungs every 6 (six) hours as needed for wheezing or shortness of breath. 07/26/20   Roxan Hockey, MD  cetirizine (ZYRTEC) 10 MG tablet Take 10 mg by mouth daily.    [provider]  Coenzyme Q10 10 MG capsule Take 40 mg by mouth daily. 4 capsules po qd    [provider]  diclofenac sodium (VOLTAREN) 1 % GEL Apply 2 g topically 4 (four) times daily as needed (for pain). 08/21/17   Hudnall, Sharyn Lull, MD  diltiazem (CARDIZEM CD) 120 MG 24 hr capsule Take 1 capsule (120 mg total) by mouth daily. 07/26/20 07/26/21  Roxan Hockey, MD  doxepin (SINEQUAN) 10 MG capsule Take 10 mg by mouth at bedtime. 10/11/13   [provider]  DULoxetine (CYMBALTA) 60 MG capsule Take 1 capsule (60 mg total) by mouth 2 (two) times daily. 07/26/20   Roxan Hockey, MD  folic acid (FOLVITE) 1 MG tablet Take 1 tablet (1 mg total) by mouth daily. 07/27/20   Roxan Hockey, MD  furosemide (LASIX) 40 MG tablet Take 1 tablet (40 mg total) by mouth daily. 07/26/20 07/26/21  Roxan Hockey, MD  gabapentin (NEURONTIN) 300 MG capsule Take 300 mg by mouth 3 (three) times daily.    [provider]  gemfibrozil (LOPID) 600 MG tablet Take 600 mg by mouth 2 (two) times daily before a meal.    [provider]  ipratropium-albuterol (DUONEB) 0.5-2.5 (3) MG/3ML SOLN Take 3 mLs by nebulization every 4 (four) hours as needed. 07/26/20   Roxan Hockey, MD  isosorbide mononitrate (IMDUR) 30 MG 24 hr tablet Take 1 tablet (30 mg total) by mouth daily. 07/26/20   Roxan Hockey, MD  levothyroxine (SYNTHROID) 25 MCG tablet Take 1 tablet (25 mcg total) by mouth daily at 6 (six) AM. 07/27/20   Emokpae, Courage, MD  lisinopril (ZESTRIL) 20 MG tablet Take 1 tablet (20 mg total) by mouth daily. 07/27/20   Roxan Hockey, MD  magnesium hydroxide  (MILK OF MAGNESIA) 400 MG/5ML suspension Take 15 mLs by mouth at bedtime.    [provider]  Melatonin 10 MG TBCR Take 60 mg by mouth at bedtime.    [provider]  Multiple Vitamin (MULTIVITAMIN) capsule Take 1 capsule by mouth daily.    [provider]  NITROSTAT 0.4 MG SL tablet Place 0.4 mg under the tongue every 15 (fifteen) minutes as needed for chest pain.  09/22/13   [provider]  omeprazole (PRILOSEC) 40 MG capsule Take 1 capsule (40 mg total) by mouth  daily. 01/03/20   Deatra James, MD  oxyCODONE (OXY IR/ROXICODONE) 5 MG immediate release tablet  05/29/19   [provider]  Tiotropium Bromide-Olodaterol (STIOLTO RESPIMAT) 2.5-2.5 MCG/ACT AERS Inhale 2 puffs into the lungs daily. 07/26/20   Roxan Hockey, MD  topiramate (TOPAMAX) 100 MG tablet Take 100 mg by mouth 2 (two) times daily. 04/28/20   [provider]  valACYclovir (VALTREX) 500 MG tablet Take 500 mg by mouth 2 (two) times daily.    [provider]  vitamin B-12 (CYANOCOBALAMIN) 1000 MCG tablet Take 1 tablet (1,000 mcg total) by mouth daily. 07/26/20   Roxan Hockey, MD  Vitamin D, Ergocalciferol, (DRISDOL) 50000 UNITS CAPS capsule Take 1 Units by mouth every 7 (seven) days. 10/18/13   [provider]  XTAMPZA ER 18 MG C12A Take 1 capsule by mouth 2 (two) times daily. 05/29/19   [provider]    Allergies    Sulfa antibiotics, Dilaudid [hydromorphone hcl], Metronidazole, Prednisone, Cephalexin, Methotrexate derivatives, and Morphine and related  Review of Systems   Review of Systems  Respiratory: Positive for shortness of breath.   Cardiovascular: Positive for chest pain.  Musculoskeletal: Positive for neck pain.  All other systems reviewed and are negative.   Physical Exam Updated Vital Signs BP (!) 176/75 (BP Location: Right Arm)    Pulse (!) 49    Temp 98.5 F (36.9 C) (Oral)    Resp 20    Ht 5' 9"  (1.753 m)    Wt 78 kg     SpO2 100%    BMI 25.40 kg/m   Physical Exam Vitals and nursing note reviewed.  Constitutional:      Appearance: Normal appearance.  HENT:     Head: Normocephalic and atraumatic.     Right Ear: External ear normal.     Left Ear: External ear normal.     Nose: Nose normal.     Mouth/Throat:     Mouth: Mucous membranes are moist.     Pharynx: Oropharynx is clear.  Eyes:     Extraocular Movements: Extraocular movements intact.     Conjunctiva/sclera: Conjunctivae normal.     Pupils: Pupils are equal, round, and reactive to light.  Neck:   Cardiovascular:     Rate and Rhythm: Regular rhythm. Bradycardia present.     Pulses: Normal pulses.     Heart sounds: Normal heart sounds.  Pulmonary:     Effort: Pulmonary effort is normal.     Breath sounds: Normal breath sounds.  Abdominal:     General: Abdomen is flat. Bowel sounds are normal.     Palpations: Abdomen is soft.  Musculoskeletal:        General: Normal range of motion.     Cervical back: Normal range of motion and neck supple. Pain with movement, spinous process tenderness and muscular tenderness present.  Skin:    General: Skin is warm.     Capillary Refill: Capillary refill takes less than 2 seconds.  Neurological:     General: No focal deficit present.     Mental Status: She is alert and oriented to person, place, and time.  Psychiatric:        Mood and Affect: Mood normal.        Behavior: Behavior normal.     ED Results / Procedures / Treatments   Labs (all labs ordered are listed, but only abnormal results are displayed) Labs Reviewed  BASIC METABOLIC PANEL - Abnormal; Notable for the following components:  Result Value   Sodium 126 (*)    Potassium >7.5 (*)    Chloride 92 (*)    Glucose, Bld 104 (*)    BUN 52 (*)    Creatinine, Ser 3.26 (*)    Calcium 8.4 (*)    GFR, Estimated 15 (*)    All other components within normal limits  CBC - Abnormal; Notable for the following components:   WBC 11.5  (*)    RBC 3.67 (*)    Hemoglobin 9.4 (*)    HCT 31.2 (*)    MCH 25.6 (*)    RDW 21.6 (*)    Platelets 429 (*)    All other components within normal limits  TROPONIN I (HIGH SENSITIVITY) - Abnormal; Notable for the following components:   Troponin I (High Sensitivity) 27 (*)    All other components within normal limits  RESP PANEL BY RT-PCR (FLU A&B, COVID) [id number]  BASIC METABOLIC PANEL  URINALYSIS, ROUTINE W REFLEX MICROSCOPIC  TROPONIN I (HIGH SENSITIVITY)    EKG EKG Interpretation  Date/Time:  Monday August 17 2020 21:08:23 EST Ventricular Rate:  50 PR Interval:    QRS Duration: 138 QT Interval:  566 QTC Calculation: 517 R Axis:   114 Text Interpretation: Junctional rhythm IVCD, consider atypical RBBB Probable lateral infarct, age indeterminate Confirmed by Isla Pence ([id number]) on 08/17/2020 9:53:45 PM   Radiology CT Cervical Spine Wo Contrast  Result Date: 08/17/2020 CLINICAL DATA:  Fall 2 days ago with neck pain. EXAM: CT CERVICAL SPINE WITHOUT CONTRAST TECHNIQUE: Multidetector CT imaging of the cervical spine was performed without intravenous contrast. Multiplanar CT image reconstructions were also generated. COMPARISON:  Cervical spine CT 10/31/2019 FINDINGS: Alignment: Stable anterolisthesis of C4 on C5, likely facet mediated. No traumatic subluxation. Skull base and vertebrae: No acute fracture. Vertebral body heights are maintained. The dens and skull base are intact. Soft tissues and spinal canal: No prevertebral fluid or swelling. No visible canal hematoma. Disc levels: Mild disc space narrowing and endplate spurring at D3-U2 and C5-C6. Facet hypertrophy is most prominent at C4-C5. Upper chest: There are small lymph nodes in the subcutaneous fat of the right neck measuring up to 7 mm. Mild perinodal edema. Other: None. IMPRESSION: 1. Mild degenerative change in the cervical spine without acute fracture or subluxation. 2. Small lymph nodes in the subcutaneous fat of the  right neck measuring up to 7 mm with mild perinodal edema. Findings are nonspecific, lymph adenitis is considered. Recommend correlation for focal tenderness. Electronically Signed   By: Keith Rake M.D.   On: 08/17/2020 22:38   DG Chest Portable 1 View  Result Date: 08/17/2020 CLINICAL DATA:  Shortness of breath and chest pain after receiving COVID booster, fall 2 days prior, neck and shoulder pain, also recent diagnosis of CHF EXAM: PORTABLE CHEST 1 VIEW COMPARISON:  CT 07/24/2020, radiograph 07/24/2020 FINDINGS: Mother some mildly coarsened interstitial changes, features are significantly diminished from prior exam, much of which is attributable to emphysematous changes seen on comparison study. A previously seen right upper lobe pulmonary nodule is difficult to visualize on radiography, certainly better seen on CT imaging though possibly projecting at the level of the right fourth rib mild near the mediastinal margins. No pneumothorax. No effusion. Cardiomediastinal contours are unremarkable. No acute osseous or soft tissue abnormality. Telemetry leads overlie the chest. IMPRESSION: 1. No acute cardiopulmonary abnormality. Diminished edematous changes from comparison study. 2. Emphysematous features. 3. Previously seen right upper lobe pulmonary nodule is difficult to visualize  on radiography, certainly better seen on CT imaging. See follow-up recommendations on CT exam dated 07/24/2020. Electronically Signed   By: Lovena Le M.D.   On: 08/17/2020 22:25    Procedures Procedures (including critical care time)  Medications Ordered in ED Medications  albuterol (VENTOLIN HFA) 108 (90 Base) MCG/ACT inhaler 2 puff (has no administration in time range)  insulin aspart (novoLOG) injection 10 Units (has no administration in time range)    And  dextrose 50 % solution 50 mL (has no administration in time range)  sodium zirconium cyclosilicate (LOKELMA) packet 5 g (has no administration in time range)   sodium chloride 0.9 % bolus 1,000 mL (0 mLs Intravenous Stopped 08/18/20 0003)  ondansetron (ZOFRAN) injection 4 mg (4 mg Intravenous Given 08/17/20 2259)  fentaNYL (SUBLIMAZE) injection 50 mcg (50 mcg Intravenous Given 08/17/20 2259)    ED Course  I have reviewed the triage vital signs and the nursing notes.  Pertinent labs & imaging results that were available during my care of the patient were reviewed by me and considered in my medical decision making (see chart for details).    MDM Rules/Calculators/A&P                          Pt given IVFs and labs are drawn.  Pt's K is over 7.5 with ARF.  I ordered a repeat BMP which showed a K of still 7.0.  Pt given insulin/glucose/lokelma.  Pt's CT cervical spine negative for acute.  CXR negative for acute.  Pt d/w Dr. Clearence Ped (triad) for admission.  Kayla Ryan was evaluated in Emergency Department on 08/18/2020 for the symptoms described in the history of present illness. She was evaluated in the context of the global COVID-19 pandemic, which necessitated consideration that the patient might be at risk for infection with the SARS-CoV-2 virus that causes COVID-19. Institutional protocols and algorithms that pertain to the evaluation of patients at risk for COVID-19 are in a state of rapid change based on information released by regulatory bodies including the CDC and federal and state organizations. These policies and algorithms were followed during the patient's care in the ED. Final Clinical Impression(s) / ED Diagnoses Final diagnoses:  Acute renal failure, unspecified acute renal failure type ([place])  Hyperkalemia  Fall, initial encounter  Strain of neck muscle, initial encounter    Rx / DC Orders ED Discharge Orders    None       Isla Pence, MD 08/18/20 0040

## 2020-08-18 ENCOUNTER — Encounter (HOSPITAL_COMMUNITY): Payer: Self-pay | Admitting: Family Medicine

## 2020-08-18 DIAGNOSIS — W19XXXA Unspecified fall, initial encounter: Secondary | ICD-10-CM | POA: Diagnosis present

## 2020-08-18 DIAGNOSIS — M1990 Unspecified osteoarthritis, unspecified site: Secondary | ICD-10-CM | POA: Diagnosis present

## 2020-08-18 DIAGNOSIS — E871 Hypo-osmolality and hyponatremia: Secondary | ICD-10-CM | POA: Diagnosis present

## 2020-08-18 DIAGNOSIS — D72829 Elevated white blood cell count, unspecified: Secondary | ICD-10-CM | POA: Diagnosis present

## 2020-08-18 DIAGNOSIS — J449 Chronic obstructive pulmonary disease, unspecified: Secondary | ICD-10-CM | POA: Diagnosis present

## 2020-08-18 DIAGNOSIS — B009 Herpesviral infection, unspecified: Secondary | ICD-10-CM | POA: Diagnosis present

## 2020-08-18 DIAGNOSIS — M419 Scoliosis, unspecified: Secondary | ICD-10-CM | POA: Diagnosis present

## 2020-08-18 DIAGNOSIS — E039 Hypothyroidism, unspecified: Secondary | ICD-10-CM | POA: Diagnosis present

## 2020-08-18 DIAGNOSIS — F419 Anxiety disorder, unspecified: Secondary | ICD-10-CM | POA: Diagnosis present

## 2020-08-18 DIAGNOSIS — D75839 Thrombocytosis, unspecified: Secondary | ICD-10-CM | POA: Diagnosis present

## 2020-08-18 DIAGNOSIS — E875 Hyperkalemia: Secondary | ICD-10-CM | POA: Diagnosis present

## 2020-08-18 DIAGNOSIS — Z825 Family history of asthma and other chronic lower respiratory diseases: Secondary | ICD-10-CM | POA: Diagnosis not present

## 2020-08-18 DIAGNOSIS — E785 Hyperlipidemia, unspecified: Secondary | ICD-10-CM | POA: Diagnosis present

## 2020-08-18 DIAGNOSIS — F32A Depression, unspecified: Secondary | ICD-10-CM | POA: Diagnosis present

## 2020-08-18 DIAGNOSIS — J309 Allergic rhinitis, unspecified: Secondary | ICD-10-CM | POA: Diagnosis present

## 2020-08-18 DIAGNOSIS — Z9049 Acquired absence of other specified parts of digestive tract: Secondary | ICD-10-CM | POA: Diagnosis not present

## 2020-08-18 DIAGNOSIS — Z90710 Acquired absence of both cervix and uterus: Secondary | ICD-10-CM | POA: Diagnosis not present

## 2020-08-18 DIAGNOSIS — G629 Polyneuropathy, unspecified: Secondary | ICD-10-CM | POA: Diagnosis present

## 2020-08-18 DIAGNOSIS — N179 Acute kidney failure, unspecified: Principal | ICD-10-CM

## 2020-08-18 DIAGNOSIS — K589 Irritable bowel syndrome without diarrhea: Secondary | ICD-10-CM | POA: Diagnosis present

## 2020-08-18 DIAGNOSIS — G7100 Muscular dystrophy, unspecified: Secondary | ICD-10-CM | POA: Diagnosis present

## 2020-08-18 DIAGNOSIS — M797 Fibromyalgia: Secondary | ICD-10-CM | POA: Diagnosis present

## 2020-08-18 DIAGNOSIS — S161XXA Strain of muscle, fascia and tendon at neck level, initial encounter: Secondary | ICD-10-CM | POA: Diagnosis present

## 2020-08-18 DIAGNOSIS — Z90722 Acquired absence of ovaries, bilateral: Secondary | ICD-10-CM | POA: Diagnosis not present

## 2020-08-18 DIAGNOSIS — Z20822 Contact with and (suspected) exposure to covid-19: Secondary | ICD-10-CM | POA: Diagnosis present

## 2020-08-18 LAB — BASIC METABOLIC PANEL
Anion gap: 14 (ref 5–15)
Anion gap: 8 (ref 5–15)
BUN: 51 mg/dL — ABNORMAL HIGH (ref 8–23)
BUN: 51 mg/dL — ABNORMAL HIGH (ref 8–23)
CO2: 23 mmol/L (ref 22–32)
CO2: 24 mmol/L (ref 22–32)
Calcium: 7.7 mg/dL — ABNORMAL LOW (ref 8.9–10.3)
Calcium: 8.4 mg/dL — ABNORMAL LOW (ref 8.9–10.3)
Chloride: 93 mmol/L — ABNORMAL LOW (ref 98–111)
Chloride: 93 mmol/L — ABNORMAL LOW (ref 98–111)
Creatinine, Ser: 2.9 mg/dL — ABNORMAL HIGH (ref 0.44–1.00)
Creatinine, Ser: 3.16 mg/dL — ABNORMAL HIGH (ref 0.44–1.00)
GFR, Estimated: 16 mL/min — ABNORMAL LOW (ref >60)
GFR, Estimated: 17 mL/min — ABNORMAL LOW (ref >60)
Glucose, Bld: 97 mg/dL (ref 70–99)
Glucose, Bld: 98 mg/dL (ref 70–99)
Potassium: 4.9 mmol/L (ref 3.5–5.1)
Potassium: 7 mmol/L (ref 3.5–5.1)
Sodium: 124 mmol/L — ABNORMAL LOW (ref 135–145)
Sodium: 131 mmol/L — ABNORMAL LOW (ref 135–145)

## 2020-08-18 LAB — CBC WITH DIFFERENTIAL/PLATELET
Abs Immature Granulocytes: 0.08 10*3/uL — ABNORMAL HIGH (ref 0.00–0.07)
Basophils Absolute: 0.1 10*3/uL (ref 0.0–0.1)
Basophils Relative: 1 %
Eosinophils Absolute: 0.1 10*3/uL (ref 0.0–0.5)
Eosinophils Relative: 1 %
HCT: 26.7 % — ABNORMAL LOW (ref 36.0–46.0)
Hemoglobin: 7.9 g/dL — ABNORMAL LOW (ref 12.0–15.0)
Immature Granulocytes: 1 %
Lymphocytes Relative: 15 %
Lymphs Abs: 1.3 10*3/uL (ref 0.7–4.0)
MCH: 25.2 pg — ABNORMAL LOW (ref 26.0–34.0)
MCHC: 29.6 g/dL — ABNORMAL LOW (ref 30.0–36.0)
MCV: 85.3 fL (ref 80.0–100.0)
Monocytes Absolute: 0.8 10*3/uL (ref 0.1–1.0)
Monocytes Relative: 9 %
Neutro Abs: 6.3 10*3/uL (ref 1.7–7.7)
Neutrophils Relative %: 73 %
Platelets: 379 10*3/uL (ref 150–400)
RBC: 3.13 MIL/uL — ABNORMAL LOW (ref 3.87–5.11)
RDW: 21.8 % — ABNORMAL HIGH (ref 11.5–15.5)
WBC: 8.6 10*3/uL (ref 4.0–10.5)
nRBC: 0 % (ref 0.0–0.2)

## 2020-08-18 LAB — POTASSIUM
Potassium: 5.5 mmol/L — ABNORMAL HIGH (ref 3.5–5.1)
Potassium: 4.9 mmol/L (ref 3.5–5.1)
Potassium: 4.5 mmol/L (ref 3.5–5.1)

## 2020-08-18 LAB — TROPONIN I (HIGH SENSITIVITY): Troponin I (High Sensitivity): 23 ng/L — ABNORMAL HIGH (ref <18)

## 2020-08-18 LAB — TSH: TSH: 0.671 u[IU]/mL (ref 0.350–4.500)

## 2020-08-18 MED ORDER — DILTIAZEM HCL ER COATED BEADS 120 MG PO CP24
120.0000 mg | ORAL_CAPSULE | Freq: Every day | ORAL | Status: DC
  Administered 2020-08-18 – 2020-08-20 (×3): 120 mg via ORAL
  Filled 2020-08-18 (×3): qty 1

## 2020-08-18 MED ORDER — FOLIC ACID 1 MG PO TABS
1.0000 mg | ORAL_TABLET | Freq: Every day | ORAL | Status: DC
  Administered 2020-08-18 – 2020-08-20 (×3): 1 mg via ORAL
  Filled 2020-08-18 (×3): qty 1

## 2020-08-18 MED ORDER — POLYETHYLENE GLYCOL 3350 17 G PO PACK
17.0000 g | PACK | Freq: Two times a day (BID) | ORAL | Status: DC
  Administered 2020-08-18 – 2020-08-20 (×4): 17 g via ORAL
  Filled 2020-08-18 (×4): qty 1

## 2020-08-18 MED ORDER — GEMFIBROZIL 600 MG PO TABS
600.0000 mg | ORAL_TABLET | Freq: Two times a day (BID) | ORAL | Status: DC
  Administered 2020-08-18 – 2020-08-20 (×5): 600 mg via ORAL
  Filled 2020-08-18 (×5): qty 1

## 2020-08-18 MED ORDER — MELATONIN 3 MG PO TABS
9.0000 mg | ORAL_TABLET | Freq: Every day | ORAL | Status: DC
  Administered 2020-08-18 – 2020-08-19 (×2): 9 mg via ORAL
  Filled 2020-08-18 (×2): qty 3

## 2020-08-18 MED ORDER — POLYETHYLENE GLYCOL 3350 17 G PO PACK
17.0000 g | PACK | Freq: Once | ORAL | Status: AC
  Administered 2020-08-18: 17 g via ORAL
  Filled 2020-08-18: qty 1

## 2020-08-18 MED ORDER — FUROSEMIDE 40 MG PO TABS
40.0000 mg | ORAL_TABLET | Freq: Every day | ORAL | Status: DC
  Filled 2020-08-18: qty 1

## 2020-08-18 MED ORDER — CALCIUM CHLORIDE 10 % IV SOLN
INTRAVENOUS | Status: AC
  Filled 2020-08-18: qty 10

## 2020-08-18 MED ORDER — ACETAMINOPHEN 650 MG RE SUPP: 650.0000 mg | Freq: Four times a day (QID) | RECTAL | Status: DC | PRN

## 2020-08-18 MED ORDER — HEPARIN SODIUM (PORCINE) 5000 UNIT/ML IJ SOLN
5000.0000 [IU] | Freq: Three times a day (TID) | INTRAMUSCULAR | Status: DC
  Administered 2020-08-18 – 2020-08-20 (×7): 5000 [IU] via SUBCUTANEOUS
  Filled 2020-08-18 (×7): qty 1

## 2020-08-18 MED ORDER — DEXTROSE 50 % IV SOLN
1.0000 | Freq: Once | INTRAVENOUS | Status: AC
  Administered 2020-08-18: 50 mL via INTRAVENOUS
  Filled 2020-08-18: qty 50

## 2020-08-18 MED ORDER — INSULIN ASPART 100 UNIT/ML IV SOLN
10.0000 [IU] | Freq: Once | INTRAVENOUS | Status: AC
  Administered 2020-08-18: 10 [IU] via INTRAVENOUS

## 2020-08-18 MED ORDER — NON FORMULARY: 18.0000 mg | Freq: Every day | Status: DC

## 2020-08-18 MED ORDER — SODIUM ZIRCONIUM CYCLOSILICATE 5 G PO PACK
5.0000 g | PACK | Freq: Once | ORAL | Status: AC
  Administered 2020-08-18: 5 g via ORAL
  Filled 2020-08-18: qty 1

## 2020-08-18 MED ORDER — DULOXETINE HCL 60 MG PO CPEP
60.0000 mg | ORAL_CAPSULE | Freq: Two times a day (BID) | ORAL | Status: DC
  Administered 2020-08-18 – 2020-08-20 (×5): 60 mg via ORAL
  Filled 2020-08-18 (×5): qty 1

## 2020-08-18 MED ORDER — OXYCODONE HCL 5 MG PO TABS
5.0000 mg | ORAL_TABLET | ORAL | Status: DC | PRN
  Administered 2020-08-18 – 2020-08-20 (×6): 5 mg via ORAL
  Filled 2020-08-18 (×6): qty 1

## 2020-08-18 MED ORDER — ACETAMINOPHEN 325 MG PO TABS: 650.0000 mg | ORAL_TABLET | Freq: Four times a day (QID) | ORAL | Status: DC | PRN

## 2020-08-18 MED ORDER — PANTOPRAZOLE SODIUM 40 MG PO TBEC
40.0000 mg | DELAYED_RELEASE_TABLET | Freq: Every day | ORAL | Status: DC
  Administered 2020-08-18 – 2020-08-20 (×3): 40 mg via ORAL
  Filled 2020-08-18 (×3): qty 1

## 2020-08-18 MED ORDER — MELATONIN ER 10 MG PO TBCR: 60.0000 mg | EXTENDED_RELEASE_TABLET | Freq: Every day | ORAL | Status: DC

## 2020-08-18 MED ORDER — IPRATROPIUM-ALBUTEROL 0.5-2.5 (3) MG/3ML IN SOLN: 3.0000 mL | RESPIRATORY_TRACT | Status: DC | PRN

## 2020-08-18 MED ORDER — ALBUTEROL SULFATE (2.5 MG/3ML) 0.083% IN NEBU
10.0000 mg | INHALATION_SOLUTION | Freq: Once | RESPIRATORY_TRACT | Status: AC
  Administered 2020-08-18: 10 mg via RESPIRATORY_TRACT
  Filled 2020-08-18: qty 12

## 2020-08-18 MED ORDER — ALBUTEROL SULFATE HFA 108 (90 BASE) MCG/ACT IN AERS: 2.0000 | INHALATION_SPRAY | RESPIRATORY_TRACT | Status: DC

## 2020-08-18 MED ORDER — CALCIUM GLUCONATE-NACL 1-0.675 GM/50ML-% IV SOLN
1.0000 g | Freq: Once | INTRAVENOUS | Status: AC
  Administered 2020-08-18: 1000 mg via INTRAVENOUS
  Filled 2020-08-18: qty 50

## 2020-08-18 MED ORDER — VITAMIN B-12 1000 MCG PO TABS
1000.0000 ug | ORAL_TABLET | Freq: Every day | ORAL | Status: DC
  Administered 2020-08-18 – 2020-08-20 (×3): 1000 ug via ORAL
  Filled 2020-08-18 (×3): qty 1

## 2020-08-18 MED ORDER — OXYCODONE ER 18 MG PO C12A
18.0000 mg | EXTENDED_RELEASE_CAPSULE | Freq: Every day | ORAL | Status: DC
  Administered 2020-08-18: 18 mg via ORAL

## 2020-08-18 MED ORDER — SODIUM CHLORIDE 0.9 % IV SOLN
1.0000 g | Freq: Once | INTRAVENOUS | Status: DC
  Filled 2020-08-18: qty 10

## 2020-08-18 MED ORDER — SODIUM BICARBONATE 8.4 % IV SOLN: 50.0000 meq | Freq: Once | INTRAVENOUS | Status: DC

## 2020-08-18 MED ORDER — LEVOTHYROXINE SODIUM 25 MCG PO TABS
25.0000 ug | ORAL_TABLET | Freq: Every day | ORAL | Status: DC
  Administered 2020-08-18 – 2020-08-20 (×3): 25 ug via ORAL
  Filled 2020-08-18 (×3): qty 1

## 2020-08-18 MED ORDER — SODIUM CHLORIDE 0.9 % IV SOLN: INTRAVENOUS | Status: AC

## 2020-08-18 MED ORDER — ISOSORBIDE MONONITRATE ER 60 MG PO TB24
30.0000 mg | ORAL_TABLET | Freq: Every day | ORAL | Status: DC
  Administered 2020-08-18 – 2020-08-20 (×3): 30 mg via ORAL
  Filled 2020-08-18 (×3): qty 1

## 2020-08-18 MED ORDER — ALBUTEROL SULFATE HFA 108 (90 BASE) MCG/ACT IN AERS: 2.0000 | INHALATION_SPRAY | Freq: Three times a day (TID) | RESPIRATORY_TRACT | Status: DC

## 2020-08-18 MED ORDER — ALBUTEROL SULFATE HFA 108 (90 BASE) MCG/ACT IN AERS
2.0000 | INHALATION_SPRAY | Freq: Four times a day (QID) | RESPIRATORY_TRACT | Status: DC | PRN
  Filled 2020-08-18: qty 6.7

## 2020-08-18 MED ORDER — PROCHLORPERAZINE EDISYLATE 10 MG/2ML IJ SOLN: 10.0000 mg | Freq: Once | INTRAMUSCULAR | Status: DC

## 2020-08-18 MED ORDER — SODIUM BICARBONATE 8.4 % IV SOLN
INTRAVENOUS | Status: AC
  Filled 2020-08-18: qty 50

## 2020-08-18 MED ORDER — GABAPENTIN 300 MG PO CAPS
300.0000 mg | ORAL_CAPSULE | Freq: Three times a day (TID) | ORAL | Status: DC
  Administered 2020-08-18 – 2020-08-20 (×7): 300 mg via ORAL
  Filled 2020-08-18 (×7): qty 1

## 2020-08-18 NOTE — ED Notes (Signed)
Pt placed back on purewick per request.

## 2020-08-18 NOTE — Plan of Care (Signed)
  Problem: Education: Goal: Knowledge of General Education information will improve Description: Including pain rating scale, medication(s)/side effects and non-pharmacologic comfort measures Outcome: Progressing   Problem: Safety: Goal: Ability to remain free from injury will improve Outcome: Progressing   Problem: Skin Integrity: Goal: Risk for impaired skin integrity will decrease Outcome: Progressing   

## 2020-08-18 NOTE — ED Notes (Signed)
Put unable to use purewick and was assisted to bathroom. Pt steady on feet with 1 person assist. Pt appears to be in NAD at this time. Will continue to monitor.

## 2020-08-18 NOTE — Progress Notes (Signed)
Kayla Ryan  is a 65 y.o. female, with history of scoliosis, polymyositis, muscular dystrophy, IBS, hyperlipidemia, fibromyalgia, emphysema, and more presents to the ED with a chief complaint of chest pain and dizziness.  Patient was admitted with AKI and noted baseline creatinine of 1.0.  This appears to be in the setting of left poor oral intake and use of lisinopril.  She was also noted to be profoundly hyperkalemic which has now improved with medications.  Plan to continue IV fluid.  Monitoring labs.  Patient seen and evaluated at bedside.   Total care time: 20 minutes.

## 2020-08-18 NOTE — TOC Initial Note (Signed)
Transition of Care [facility]) - Initial/Assessment Note   Patient Details  Name: Kayla Ryan MRN: [id number] Date of Birth: [birth year]  Transition of [facility] CM/SW Contact:    Sherie Don, LCSW Phone Number: 08/18/2020, 2:43 PM  Clinical Narrative: Patient is a 65 year old female who was admitted for AKI. Readmission checklist completed due to high readmission score.  CSW completed assessment with patient. Per patient, she lives alone but has a neighbor, Ron, who lives next door that assists with transportation to appointments. Patient's DME includes a cane and home O2 from [place]. Patient reported she is having increased difficulty completing her ADLs and is only able to do "sink baths" as she cannot safely get in and out of the shower. Patient reported she is able to afford her medications through her Medicaid. TOC to follow for discharge needs.  Expected Discharge Plan: Home/Self Care Barriers to Discharge: Continued Medical Work up  Patient Goals and CMS Choice Patient states their goals for this hospitalization and ongoing recovery are:: Be able to complete ADLs better CMS Medicare.gov Compare Post Acute Care list provided to:: Patient Choice offered to / list presented to : Patient  Expected Discharge Plan and Services Expected Discharge Plan: Home/Self Care In-house Referral: Clinical Social Work Discharge Planning Services: NA Post Acute Care Choice: NA Living arrangements for the past 2 months: Apartment           DME Arranged: N/A DME Agency: NA HH Arranged: NA [place] Agency: NA  Prior Living Arrangements/Services Living arrangements for the past 2 months: Apartment Lives with:: Self Patient language and need for interpreter reviewed:: Yes Do you feel safe going back to the place where you live?: Yes      Need for Family Participation in Patient Care: Yes (Comment) Care giver support system in place?: No (comment) Current home services: DME (Cane, O2  ([place])) Criminal Activity/Legal Involvement Pertinent to Current Situation/Hospitalization: No - Comment as needed  Activities of Daily Living Home Assistive Devices/Equipment: CBG Meter,Nebulizer,Oxygen,Cane (specify quad or straight) ADL Screening (condition at time of admission) Patient's cognitive ability adequate to safely complete daily activities?: Yes Is the patient deaf or have difficulty hearing?: No Does the patient have difficulty seeing, even when wearing glasses/contacts?: No Does the patient have difficulty concentrating, remembering, or making decisions?: No Patient able to express need for assistance with ADLs?: Yes Does the patient have difficulty dressing or bathing?: No Independently performs ADLs?: Yes (appropriate for developmental age) Does the patient have difficulty walking or climbing stairs?: No Weakness of Legs: None Weakness of Arms/Hands: None  Emotional Assessment Appearance:: Appears stated age Attitude/Demeanor/Rapport: Engaged Affect (typically observed): Accepting Orientation: : Oriented to Self,Oriented to Place,Oriented to  Time,Oriented to Situation Alcohol / Substance Use: Not Applicable Psych Involvement: No (comment)  Admission diagnosis:  Hyperkalemia [E87.5] AKI (acute kidney injury) ([place]) [N17.9] Strain of neck muscle, initial encounter [S16.1XXA] Fall, initial encounter [id number]] Acute renal failure, unspecified acute renal failure type [facility]) [N17.9] Patient Active Problem List   Diagnosis Date Noted  . AKI (acute kidney injury) ([place]) 08/18/2020  . Hypothyroidism 07/26/2020  . Acute respiratory failure with hypoxia ([place]) 07/26/2020  . B12 and Folate deficiency 07/26/2020  . Folate deficiency anemia--B12 AND Folate deficiency 07/26/2020  . Atrial fibrillation with RVR ([place]) 07/25/2020  . Acute on chronic anemia 07/24/2020  . Leukocytosis 07/24/2020  . Pulmonary nodules 07/24/2020  . Acute CHF (congestive heart failure) ([place])  07/24/2020  . Dyspnea 01/31/2020  . Anemia 01/03/2020  .  Syncope 06/08/2019  . Hypokalemia 06/08/2019  . Trigger finger, acquired 11/03/2017  . Chronic obstructive pulmonary disease/Emphysema 08/23/2017  . Chronic pain 08/23/2017  . History of Crohn's disease 08/23/2017  . Myopathy 08/23/2017  . Right shoulder injury, subsequent encounter 08/23/2017  . Primary insomnia 02/01/2017  . Esophageal dysphagia 10/06/2016  . Essential hypertension 07/17/2016  . Iron deficiency anemia 03/25/2016  . History of Bell's palsy 03/03/2015  . Nuclear sclerosis of both eyes 03/03/2015  . Allergic rhinitis 05/18/2014  . Anxiety 05/18/2014  . Arthritis 05/18/2014  . Asthma 05/18/2014  . Chronic constipation 05/18/2014  . Dermatomyositis ([place]) 05/18/2014  . Migraine without status migrainosus, not intractable 05/18/2014  . Rectocele 05/18/2014  . 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 Peripheral neuropathy 05/18/2014  . Chest pain 10/30/2013  . Hyperlipidemia 10/30/2013  . Carotid artery disease ([place]) 10/30/2013   PCP:  Sandi Mariscal, MD Pharmacy:   [place], [place] [address] Phone: [phone] Fax: [phone]  [id number] [place] [place] [facility]: [phone]: [address] [place]Phone: [phone]: [phone] Readmission Risk Interventions Readmission Risk Prevention Plan 08/18/2020  Transportation Screening Complete  Medication Review (Press photographer Complete  HRI or Home Care Consult Complete  SW Recovery Care/Counseling Consult Complete  Palliative Care Screening Not Applicable  Some recent data might be hidden

## 2020-08-18 NOTE — H&P (Signed)
TRH H&P    Patient Demographics:    Kayla Ryan, is a 65 y.o. female  MRN: [id number]  DOB - [birth year]  Admit Date - 08/17/2020  Referring MD/NP/PA: Gilford Raid  Outpatient Primary MD for the patient is Sandi Mariscal, MD  Patient coming from: home  Chief complaint- Chest pain and dizziness   HPI:    Chirstine Ryan  is a 65 y.o. female, with history of scoliosis, polymyositis, muscular dystrophy, IBS, hyperlipidemia, fibromyalgia, emphysema, and more presents to the ED with a chief complaint of chest pain and dizziness.  Patient reports that it started 3 days ago.  She reports the chest pain is constant and she just tries to ignore it.  It is worse with exertion and better with rest.  She admits to orthopnea but reports that that has been going on for quite some time.  She does not wear oxygen at home.  She has felt short of breath and they put a 2 L nasal cannula on her in the ER for comfort.  Patient admits to palpitations but says that this is also been going on for a while.  With her dizziness she reports that it is intermittent and unpredictable.  It is not associated with standing.  She does report that her palpitations are associated with standing.  She has a decrease in urine output but reports that this has been going on for years and she has seen a urologist for it but never followed up with them.  She has no known history of kidney problems other than kidney stones many years ago.  Patient admits to muscle cramping and twitching but this is also been going on for a long time.  The only acute symptoms that she has are chest pain and dizziness.  Of note patient was started on lisinopril at her last hospitalization per her report this is consistent with her discharge summary.  This hospitalization was in December.  She has not followed up with any outpatient physician.  She reports that she has had to reschedule several  times.  Review of chart reveals that she was advised to get BMP in 1 week.  In addition to lisinopril patient was also started on Synthroid, and folic acid at her last hospitalization.  In the ED Temperature 99.5, heart rate 48-50, respiratory rate 15-20, blood pressure 176/75 Leukocytosis of 11.5, thrombocytosis of 429 Hyponatremia 124, hyperkalemia 7.5 and then 7.0 BUN and creatinine are 52 and 3.26, on December 18 they were 18 and 0.98 Troponins were 27 and then 23 Patient was given Lokelma, glucose and insulin Admission requested for further management of hyperkalemia, acute renal failure, and monitoring on telemetry EKG shows prolonged QT, bradycardia   Review of systems:    In addition to the HPI above,  No Fever-chills, No Headache, No changes with Vision or hearing, No problems swallowing food or Liquids, Admits to chest pain, no cough No Abdominal pain, admits to nausea, bowel movements are regular, No Blood in stool or Urine, admits to decreased urine output No dysuria, No new  skin rashes or bruises, No new joints pains-aches,  No new weakness, tingling, numbness in any extremity, No recent weight gain or loss, No polyuria, polydypsia or polyphagia, No significant Mental Stressors.  All other systems reviewed and are negative.    Past History of the following :    Past Medical History:  Diagnosis Date  . Arthritis   . Asthma   . Chest pain   . Chronic bronchitis   . COPD (chronic obstructive pulmonary disease) ([place])   . Crohn disease ([place])   . Emphysema   . Emphysema of lung ([place])   . Fibromyalgia   . Herpes   . Hyperlipidemia   . IBS (irritable bowel syndrome)   . Migraines   . Muscular dystrophy ([place])   . Neck pain   . Plantar fasciitis   . Polymyositis ([place])   . Scoliosis       Past Surgical History:  Procedure Laterality Date  . ABDOMINAL HYSTERECTOMY    . bone spur    . CHOLECYSTECTOMY    . HERNIA REPAIR    . LEFT HEART CATHETERIZATION  WITH CORONARY ANGIOGRAM N/A 11/07/2013   Procedure: LEFT HEART CATHETERIZATION WITH CORONARY ANGIOGRAM;  Surgeon: Lorretta Harp, MD;  Location: [facility] CATH LAB;  Service: Cardiovascular;  Laterality: N/A;  . MASTECTOMY PARTIAL / LUMPECTOMY    . OOPHORECTOMY    . ROTATOR CUFF REPAIR        Social History:      Social History   Tobacco Use  . Smoking status: Former Smoker    Packs/day: 1.00    Years: 49.00    Pack years: 49.00    Types: Cigarettes    Start date: 08/08/1966    Quit date: 07/03/2011    Years since quitting: 9.1  . Smokeless tobacco: Never Used  . Tobacco comment: Originally quit 2007 for 5 years then restarted in 2012 for 3 months then quit  Substance Use Topics  . Alcohol use: No       Family History :     Family History  Problem Relation Age of Onset  . Lung cancer Mother   . COPD Father   . Lung cancer Father   . Lymphoma Father       Home Medications:   Prior to Admission medications   Medication Sig Start Date End Date Taking? Authorizing Provider  acetaminophen (TYLENOL) 325 MG tablet Take 2 tablets (650 mg total) by mouth every 4 (four) hours as needed for headache or mild pain. 07/26/20   Roxan Hockey, MD  albuterol (PROAIR HFA) 108 (90 Base) MCG/ACT inhaler Inhale 2 puffs into the lungs every 6 (six) hours as needed for wheezing or shortness of breath. 07/26/20   Roxan Hockey, MD  cetirizine (ZYRTEC) 10 MG tablet Take 10 mg by mouth daily.    [provider]  Coenzyme Q10 10 MG capsule Take 40 mg by mouth daily. 4 capsules po qd    [provider]  diclofenac sodium (VOLTAREN) 1 % GEL Apply 2 g topically 4 (four) times daily as needed (for pain). 08/21/17   Hudnall, Sharyn Lull, MD  diltiazem (CARDIZEM CD) 120 MG 24 hr capsule Take 1 capsule (120 mg total) by mouth daily. 07/26/20 07/26/21  Roxan Hockey, MD  doxepin (SINEQUAN) 10 MG capsule Take 10 mg by mouth at bedtime. 10/11/13   [provider]  DULoxetine  (CYMBALTA) 60 MG capsule Take 1 capsule (60 mg total) by mouth 2 (two) times daily. 07/26/20  Roxan Hockey, MD  folic acid (FOLVITE) 1 MG tablet Take 1 tablet (1 mg total) by mouth daily. 07/27/20   Roxan Hockey, MD  furosemide (LASIX) 40 MG tablet Take 1 tablet (40 mg total) by mouth daily. 07/26/20 07/26/21  Roxan Hockey, MD  gabapentin (NEURONTIN) 300 MG capsule Take 300 mg by mouth 3 (three) times daily.    [provider]  gemfibrozil (LOPID) 600 MG tablet Take 600 mg by mouth 2 (two) times daily before a meal.    [provider]  ipratropium-albuterol (DUONEB) 0.5-2.5 (3) MG/3ML SOLN Take 3 mLs by nebulization every 4 (four) hours as needed. 07/26/20   Roxan Hockey, MD  isosorbide mononitrate (IMDUR) 30 MG 24 hr tablet Take 1 tablet (30 mg total) by mouth daily. 07/26/20   Roxan Hockey, MD  levothyroxine (SYNTHROID) 25 MCG tablet Take 1 tablet (25 mcg total) by mouth daily at 6 (six) AM. 07/27/20   Emokpae, Courage, MD  lisinopril (ZESTRIL) 20 MG tablet Take 1 tablet (20 mg total) by mouth daily. 07/27/20   Roxan Hockey, MD  magnesium hydroxide (MILK OF MAGNESIA) 400 MG/5ML suspension Take 15 mLs by mouth at bedtime.    [provider]  Melatonin 10 MG TBCR Take 60 mg by mouth at bedtime.    [provider]  Multiple Vitamin (MULTIVITAMIN) capsule Take 1 capsule by mouth daily.    [provider]  NITROSTAT 0.4 MG SL tablet Place 0.4 mg under the tongue every 15 (fifteen) minutes as needed for chest pain.  09/22/13   [provider]  omeprazole (PRILOSEC) 40 MG capsule Take 1 capsule (40 mg total) by mouth daily. 01/03/20   Deatra James, MD  oxyCODONE (OXY IR/ROXICODONE) 5 MG immediate release tablet  05/29/19   [provider]  Tiotropium Bromide-Olodaterol (STIOLTO RESPIMAT) 2.5-2.5 MCG/ACT AERS Inhale 2 puffs into the lungs daily. 07/26/20   Roxan Hockey, MD  topiramate (TOPAMAX) 100 MG tablet  Take 100 mg by mouth 2 (two) times daily. 04/28/20   [provider]  valACYclovir (VALTREX) 500 MG tablet Take 500 mg by mouth 2 (two) times daily.    [provider]  vitamin B-12 (CYANOCOBALAMIN) 1000 MCG tablet Take 1 tablet (1,000 mcg total) by mouth daily. 07/26/20   Roxan Hockey, MD  Vitamin D, Ergocalciferol, (DRISDOL) 50000 UNITS CAPS capsule Take 1 Units by mouth every 7 (seven) days. 10/18/13   [provider]  XTAMPZA ER 18 MG C12A Take 1 capsule by mouth 2 (two) times daily. 05/29/19   [provider]     Allergies:     Allergies  Allergen Reactions  . Sulfa Antibiotics Shortness Of Breath, Swelling and Rash  . Dilaudid [Hydromorphone Hcl]     Makes me crazy  . Metronidazole Diarrhea and Nausea And Vomiting  . Prednisone Other (See Comments)    angry angry   . Cephalexin Diarrhea and Nausea And Vomiting  . Methotrexate Derivatives Swelling and Rash  . Morphine And Related Anxiety    "exreme mood swings"     Physical Exam:   Vitals  Blood pressure 118/83, pulse (!) 48, temperature 98.5 F (36.9 C), temperature source Oral, resp. rate 18, height 5' 9"  (1.753 m), weight 78 kg, SpO2 100 %.  1.  General: Resting supine in bed in no acute distress.  2. Psychiatric: Mood and behavior normal for situation, cooperative with exam, alert and oriented x3  3. Neurologic: Cranial nerves II through XII are grossly intact, no focal  deficit on limited exam, speech and language normal  4. HEENMT:  Head is atraumatic, normocephalic, pupils reactive to light, neck is supple, trachea is midline, reported dentition, mucous membranes are moist  5. Respiratory : Lungs are clear to auscultation bilaterally  6. Cardiovascular : Heart rate is bradycardic, regular, no murmurs rubs or gallops  7. Gastrointestinal:  Abdomen is soft, nondistended, nontender to palpation  8. Skin:  No acute lesions on limited exam  9.Musculoskeletal:  No  peripheral edema, no calf tenderness, no acute deformity    Data Review:    CBC Recent Labs  Lab 08/17/20 2112  WBC 11.5*  HGB 9.4*  HCT 31.2*  PLT 429*  MCV 85.0  MCH 25.6*  MCHC 30.1  RDW 21.6*   ------------------------------------------------------------------------------------------------------------------  Results for orders placed or performed during the hospital encounter of 08/17/20 (from the past 48 hour(s))  Basic metabolic panel     Status: Abnormal   Collection Time: 08/17/20  9:12 PM  Result Value Ref Range   Sodium 126 (L) 135 - 145 mmol/L   Potassium >7.5 (HH) 3.5 - 5.1 mmol/L    Comment: CRITICAL RESULT CALLED TO, READ BACK BY AND VERIFIED WITH: [place],K ON 08/17/20 AT 2315 BY LOY,C    Chloride 92 (L) 98 - 111 mmol/L   CO2 23 22 - 32 mmol/L   Glucose, Bld 104 (H) 70 - 99 mg/dL    Comment: Glucose reference range applies only to samples taken after fasting for at least 8 hours.   BUN 52 (H) 8 - 23 mg/dL   Creatinine, Ser 3.26 (H) 0.44 - 1.00 mg/dL   Calcium 8.4 (L) 8.9 - 10.3 mg/dL   GFR, Estimated 15 (L) >60 mL/min    Comment: (NOTE) Calculated using the CKD-EPI Creatinine Equation (2021)    Anion gap 11 5 - 15    Comment: Performed at [facility], [address] [place], [place] [address]  CBC     Status: Abnormal   Collection Time: 08/17/20  9:12 PM  Result Value Ref Range   WBC 11.5 (H) 4.0 - 10.5 K/uL   RBC 3.67 (L) 3.87 - 5.11 MIL/uL   Hemoglobin 9.4 (L) 12.0 - 15.0 g/dL   HCT 31.2 (L) 36.0 - 46.0 %   MCV 85.0 80.0 - 100.0 fL   MCH 25.6 (L) 26.0 - 34.0 pg   MCHC 30.1 30.0 - 36.0 g/dL   RDW 21.6 (H) 11.5 - 15.5 %   Platelets 429 (H) 150 - 400 K/uL   nRBC 0.0 0.0 - 0.2 %    Comment: Performed at [facility], [address] [place], [place] [address]  Resp Panel by RT-PCR (Flu A&B, Covid) Nasopharyngeal Swab     Status: None   Collection Time: 08/17/20  9:25 PM   Specimen: Nasopharyngeal Swab; Nasopharyngeal(NP) swabs in vial transport  medium  Result Value Ref Range   SARS Coronavirus 2 by RT PCR NEGATIVE NEGATIVE    Comment: (NOTE) SARS-CoV-2 target nucleic acids are NOT DETECTED.  The SARS-CoV-2 RNA is generally detectable in upper respiratory specimens during the acute phase of infection. The lowest concentration of SARS-CoV-2 viral copies this assay can detect is 138 copies/mL. A negative result does not preclude SARS-Cov-2 infection and should not be used as the sole basis for treatment or other patient management decisions. A negative result may occur with  improper specimen collection/handling, submission of specimen other than nasopharyngeal swab, presence of viral mutation(s) within the areas targeted by this assay,  and inadequate number of viral copies(<138 copies/mL). A negative result must be combined with clinical observations, patient history, and epidemiological information. The expected result is Negative.  Fact Sheet for Patients:  EntrepreneurPulse.com.au  Fact Sheet for Healthcare Providers:  IncredibleEmployment.be  This test is no t yet approved or cleared by the [place] FDA and  has been authorized for detection and/or diagnosis of SARS-CoV-2 by FDA under an Emergency Use Authorization (EUA). This EUA will remain  in effect (meaning this test can be used) for the duration of the COVID-19 declaration under Section 564(b)(1) of the Act, 21 U.S.C.section 360bbb-3(b)(1), unless the authorization is terminated  or revoked sooner.       Influenza A by PCR NEGATIVE NEGATIVE   Influenza B by PCR NEGATIVE NEGATIVE    Comment: (NOTE) The Xpert Xpress SARS-CoV-2/FLU/RSV plus assay is intended as an aid in the diagnosis of influenza from Nasopharyngeal swab specimens and should not be used as a sole basis for treatment. Nasal washings and aspirates are unacceptable for Xpert Xpress SARS-CoV-2/FLU/RSV testing.  Fact Sheet for  Patients: EntrepreneurPulse.com.au  Fact Sheet for Healthcare Providers: IncredibleEmployment.be  This test is not yet approved or cleared by the [place] FDA and has been authorized for detection and/or diagnosis of SARS-CoV-2 by FDA under an Emergency Use Authorization (EUA). This EUA will remain in effect (meaning this test can be used) for the duration of the COVID-19 declaration under Section 564(b)(1) of the Act, 21 U.S.C. section 360bbb-3(b)(1), unless the authorization is terminated or revoked.  Performed at [facility], [address], [place], [place] [address]   Troponin I (High Sensitivity)     Status: Abnormal   Collection Time: 08/17/20  9:25 PM  Result Value Ref Range   Troponin I (High Sensitivity) 27 (H) <18 ng/L    Comment: (NOTE) Elevated high sensitivity troponin I (hsTnI) values and significant  changes across serial measurements may suggest ACS but many other  chronic and acute conditions are known to elevate hsTnI results.  Refer to the "Links" section for chest pain algorithms and additional  guidance. Performed at [facility], [address] [place], [place] [address]   Troponin I (High Sensitivity)     Status: Abnormal   Collection Time: 08/17/20 11:40 PM  Result Value Ref Range   Troponin I (High Sensitivity) 23 (H) <18 ng/L    Comment: (NOTE) Elevated high sensitivity troponin I (hsTnI) values and significant  changes across serial measurements may suggest ACS but many other  chronic and acute conditions are known to elevate hsTnI results.  Refer to the Links section for chest pain algorithms and additional  guidance. Performed at [facility], [address] [place],  [address]   Basic metabolic panel     Status: Abnormal   Collection Time: 08/17/20 11:40 PM  Result Value Ref Range   Sodium 124 (L) 135 - 145 mmol/L   Potassium 7.0 (HH) 3.5 - 5.1 mmol/L    Comment: CRITICAL RESULT CALLED TO, READ  BACK BY AND VERIFIED WITH: DR. HAVELAND,RN@0053  08/18/20 MKELLY    Chloride 93 (L) 98 - 111 mmol/L   CO2 23 22 - 32 mmol/L   Glucose, Bld 98 70 - 99 mg/dL    Comment: Glucose reference range applies only to samples taken after fasting for at least 8 hours.   BUN 51 (H) 8 - 23 mg/dL   Creatinine, Ser 3.16 (H) 0.44 - 1.00 mg/dL   Calcium 7.7 (L) 8.9 - 10.3 mg/dL  GFR, Estimated 16 (L) >60 mL/min    Comment: (NOTE) Calculated using the CKD-EPI Creatinine Equation (2021)    Anion gap 8 5 - 15    Comment: Performed at [facility], [address] [place], [place] [address]    Chemistries  Recent Labs  Lab 08/17/20 2112 08/17/20 2340  NA 126* 124*  K >7.5* 7.0*  CL 92* 93*  CO2 23 23  GLUCOSE 104* 98  BUN 52* 51*  CREATININE 3.26* 3.16*  CALCIUM 8.4* 7.7*   ------------------------------------------------------------------------------------------------------------------  ------------------------------------------------------------------------------------------------------------------ GFR: Estimated Creatinine Clearance: 18.3 mL/min (A) (by C-G formula based on SCr of 3.16 mg/dL (H)). Liver Function Tests: No results for input(s): AST, ALT, ALKPHOS, BILITOT, PROT, ALBUMIN in the last 168 hours. No results for input(s): LIPASE, AMYLASE in the last 168 hours. No results for input(s): AMMONIA in the last 168 hours. Coagulation Profile: No results for input(s): INR, PROTIME in the last 168 hours. Cardiac Enzymes: No results for input(s): CKTOTAL, CKMB, CKMBINDEX, TROPONINI in the last 168 hours. BNP (last 3 results) No results for input(s): PROBNP in the last 8760 hours. HbA1C: No results for input(s): HGBA1C in the last 72 hours. CBG: No results for input(s): GLUCAP in the last 168 hours. Lipid Profile: No results for input(s): CHOL, HDL, LDLCALC, TRIG, CHOLHDL, LDLDIRECT in the last 72 hours. Thyroid Function Tests: No results for input(s): TSH, T4TOTAL, FREET4, T3FREE,  THYROIDAB in the last 72 hours. Anemia Panel: No results for input(s): VITAMINB12, FOLATE, FERRITIN, TIBC, IRON, RETICCTPCT in the last 72 hours.  --------------------------------------------------------------------------------------------------------------- Urine analysis:    Component Value Date/Time   COLORURINE STRAW (A) 06/08/2019 0103   APPEARANCEUR CLEAR 06/08/2019 0103   LABSPEC 1.005 06/08/2019 0103   PHURINE 7.0 06/08/2019 0103   GLUCOSEU NEGATIVE 06/08/2019 0103   HGBUR NEGATIVE 06/08/2019 0103   BILIRUBINUR NEGATIVE 06/08/2019 0103   KETONESUR NEGATIVE 06/08/2019 0103   PROTEINUR NEGATIVE 06/08/2019 0103   UROBILINOGEN 0.2 06/08/2011 1400   NITRITE NEGATIVE 06/08/2019 0103   LEUKOCYTESUR NEGATIVE 06/08/2019 0103      Imaging Results:    CT Cervical Spine Wo Contrast  Result Date: 08/17/2020 CLINICAL DATA:  Fall 2 days ago with neck pain. EXAM: CT CERVICAL SPINE WITHOUT CONTRAST TECHNIQUE: Multidetector CT imaging of the cervical spine was performed without intravenous contrast. Multiplanar CT image reconstructions were also generated. COMPARISON:  Cervical spine CT 10/31/2019 FINDINGS: Alignment: Stable anterolisthesis of C4 on C5, likely facet mediated. No traumatic subluxation. Skull base and vertebrae: No acute fracture. Vertebral body heights are maintained. The dens and skull base are intact. Soft tissues and spinal canal: No prevertebral fluid or swelling. No visible canal hematoma. Disc levels: Mild disc space narrowing and endplate spurring at T0-W4 and C5-C6. Facet hypertrophy is most prominent at C4-C5. Upper chest: There are small lymph nodes in the subcutaneous fat of the right neck measuring up to 7 mm. Mild perinodal edema. Other: None. IMPRESSION: 1. Mild degenerative change in the cervical spine without acute fracture or subluxation. 2. Small lymph nodes in the subcutaneous fat of the right neck measuring up to 7 mm with mild perinodal edema. Findings are  nonspecific, lymph adenitis is considered. Recommend correlation for focal tenderness. Electronically Signed   By: Keith Rake M.D.   On: 08/17/2020 22:38   DG Chest Portable 1 View  Result Date: 08/17/2020 CLINICAL DATA:  Shortness of breath and chest pain after receiving COVID booster, fall 2 days prior, neck and shoulder pain, also recent diagnosis of CHF EXAM:  PORTABLE CHEST 1 VIEW COMPARISON:  CT 07/24/2020, radiograph 07/24/2020 FINDINGS: Mother some mildly coarsened interstitial changes, features are significantly diminished from prior exam, much of which is attributable to emphysematous changes seen on comparison study. A previously seen right upper lobe pulmonary nodule is difficult to visualize on radiography, certainly better seen on CT imaging though possibly projecting at the level of the right fourth rib mild near the mediastinal margins. No pneumothorax. No effusion. Cardiomediastinal contours are unremarkable. No acute osseous or soft tissue abnormality. Telemetry leads overlie the chest. IMPRESSION: 1. No acute cardiopulmonary abnormality. Diminished edematous changes from comparison study. 2. Emphysematous features. 3. Previously seen right upper lobe pulmonary nodule is difficult to visualize on radiography, certainly better seen on CT imaging. See follow-up recommendations on CT exam dated 07/24/2020. Electronically Signed   By: Lovena Le M.D.   On: 08/17/2020 22:25      Assessment & Plan:    Active Problems:   AKI (acute kidney injury) ([place])   1. AKI 1. Baseline Cr 1.0 2. Started on lisinopril and lasix in Dec (per her report) and has not followed up with her PCP 3. Hold lisinopril, continuing lasix in the setting of hyperkalemia 4. 1 L Fluid in ED 5. Continue maintenance fluids 6. Follow labs in the AM 2. Hyperkalemia 1. K+ 7.5 and then 7.0 2. 1L in ED as well as insulin/glucose and Lokelma 3. Continuous neb and 1g calcium at admission 4. Monitor on  tele 5. Follow BMP 6. Likely 2/2 AKI 3. Hyponatremia 1. Na 124 2. Continue NS and follow BMP in the AM 4. Prolonged QT 1. 2/2 electrolyte disturbances. 2. Monitor on tele 3. Correct electrolytes as above 5. Leukocytosis and thrombocytosis 1. Acute phase reactant 2. Follow CBC in the AM 6. Nausea 1. One time dose of compazine - no standing order for zofran 2/2 prolonged QT    DVT Prophylaxis-   Heparin - SCDs  AM Labs Ordered, also please review Full Orders  Family Communication: No family at bedside  Code Status:  Full  Admission status: Inpatient :The appropriate admission status for this patient is INPATIENT. Inpatient status is judged to be reasonable and necessary in order to provide the required intensity of service to ensure the patient's safety. The patient's presenting symptoms, physical exam findings, and initial radiographic and laboratory data in the context of their chronic comorbidities is felt to place them at high risk for further clinical deterioration. Furthermore, it is not anticipated that the patient will be medically stable for discharge from the hospital within 2 midnights of admission. The following factors support the admission status of inpatient.     The patient's presenting symptoms include dizziness and chest pain The worrisome physical exam findings include bradycardia and prolonged QT the initial radiographic and laboratory data are worrisome because of labs show acute renal failure with potassium of seven-point 5 repeat 7.0 The chronic co-morbidities include COPD, hyperlipidemia documented history of muscular dystrophy       * I certify that at the point of admission it is my clinical judgment that the patient will require inpatient hospital care spanning beyond 2 midnights from the point of admission due to high intensity of service, high risk for further deterioration and high frequency of surveillance required.*  Time spent in minutes :  Ko Olina

## 2020-08-19 DIAGNOSIS — N179 Acute kidney failure, unspecified: Secondary | ICD-10-CM | POA: Diagnosis not present

## 2020-08-19 LAB — MAGNESIUM: Magnesium: 2.7 mg/dL — ABNORMAL HIGH (ref 1.7–2.4)

## 2020-08-19 LAB — BASIC METABOLIC PANEL
Anion gap: 8 (ref 5–15)
BUN: 31 mg/dL — ABNORMAL HIGH (ref 8–23)
CO2: 26 mmol/L (ref 22–32)
Calcium: 8.4 mg/dL — ABNORMAL LOW (ref 8.9–10.3)
Chloride: 104 mmol/L (ref 98–111)
Creatinine, Ser: 1.53 mg/dL — ABNORMAL HIGH (ref 0.44–1.00)
GFR, Estimated: 37 mL/min — ABNORMAL LOW (ref >60)
Glucose, Bld: 97 mg/dL (ref 70–99)
Potassium: 4.5 mmol/L (ref 3.5–5.1)
Sodium: 138 mmol/L (ref 135–145)

## 2020-08-19 LAB — CBC
HCT: 27.1 % — ABNORMAL LOW (ref 36.0–46.0)
Hemoglobin: 7.7 g/dL — ABNORMAL LOW (ref 12.0–15.0)
MCH: 24.7 pg — ABNORMAL LOW (ref 26.0–34.0)
MCHC: 28.4 g/dL — ABNORMAL LOW (ref 30.0–36.0)
MCV: 86.9 fL (ref 80.0–100.0)
Platelets: 353 10*3/uL (ref 150–400)
RBC: 3.12 MIL/uL — ABNORMAL LOW (ref 3.87–5.11)
RDW: 22.2 % — ABNORMAL HIGH (ref 11.5–15.5)
WBC: 5.5 10*3/uL (ref 4.0–10.5)
nRBC: 0 % (ref 0.0–0.2)

## 2020-08-19 MED ORDER — OXYCODONE HCL 5 MG PO TABS: 5.0000 mg | ORAL_TABLET | ORAL | 0 refills | Status: DC | PRN

## 2020-08-19 MED ORDER — OXYCODONE ER 18 MG PO C12A
18.0000 mg | EXTENDED_RELEASE_CAPSULE | Freq: Two times a day (BID) | ORAL | Status: DC
  Administered 2020-08-20: 18 mg via ORAL
  Filled 2020-08-19: qty 1

## 2020-08-19 MED ORDER — XTAMPZA ER 18 MG PO C12A: 1.0000 | EXTENDED_RELEASE_CAPSULE | Freq: Two times a day (BID) | ORAL | 0 refills | Status: DC

## 2020-08-19 MED ORDER — LORAZEPAM 0.5 MG PO TABS: 0.5000 mg | ORAL_TABLET | Freq: Three times a day (TID) | ORAL | 0 refills | Status: DC | PRN

## 2020-08-19 NOTE — Plan of Care (Signed)
  Problem: Acute Rehab PT Goals(only PT should resolve) Goal: Pt Will Go Supine/Side To Sit Outcome: Progressing Flowsheets (Taken 08/19/2020 1009) Pt will go Supine/Side to Sit: Independently Goal: Patient Will Transfer Sit To/From Stand Outcome: Progressing Flowsheets (Taken 08/19/2020 1009) Patient will transfer sit to/from stand: with supervision Goal: Pt Will Transfer Bed To Chair/Chair To Bed Outcome: Progressing Flowsheets (Taken 08/19/2020 1009) Pt will Transfer Bed to Chair/Chair to Bed: with supervision Goal: Pt Will Ambulate Outcome: Progressing Flowsheets (Taken 08/19/2020 1009) Pt will Ambulate:  75 feet  with supervision  with least restrictive assistive device   10:10 AM, 08/19/20 Lonell Grandchild, MPT Physical Therapist with [facility] 336 [phone] office [phone] mobile phone

## 2020-08-19 NOTE — Evaluation (Signed)
Physical Therapy Evaluation Patient Details Name: Kayla Ryan MRN: [id number] DOB: 03-05-1956 Today's Date: 08/19/2020   History of Present Illness  Kayla Ryan  is a 65 y.o. female, with history of scoliosis, polymyositis, muscular dystrophy, IBS, hyperlipidemia, fibromyalgia, emphysema, and more presents to the ED with a chief complaint of chest pain and dizziness.  Patient reports that it started 3 days ago.  She reports the chest pain is constant and she just tries to ignore it.  It is worse with exertion and better with rest.  She admits to orthopnea but reports that that has been going on for quite some time.  She does not wear oxygen at home.  She has felt short of breath and they put a 2 L nasal cannula on her in the ER for comfort.  Patient admits to palpitations but says that this is also been going on for a while.  With her dizziness she reports that it is intermittent and unpredictable.  It is not associated with standing.  She does report that her palpitations are associated with standing.  She has a decrease in urine output but reports that this has been going on for years and she has seen a urologist for it but never followed up with them.  She has no known history of kidney problems other than kidney stones many years ago.  Patient admits to muscle cramping and twitching but this is also been going on for a long time.  The only acute symptoms that she has are chest pain and dizziness.    Clinical Impression  Patient demonstrates slightly labored movement for sitting up at bedside, has to lean on nearby objects for support during ambulation due to unsteadiness while pushing IV pole, limited mostly due to c/o fatigue, on 2 LPM [place] O2 with SpO2 at 92-95% during ambulation and tolerated sitting up at bedside after therapy - RN aware.  Patient will benefit from continued physical therapy in hospital and recommended venue below to increase strength, balance, endurance for safe ADLs and gait.     Follow Up Recommendations SNF;Supervision for mobility/OOB;Supervision - Intermittent    Equipment Recommendations  Rolling walker with 5" wheels    Recommendations for Other Services       Precautions / Restrictions Precautions Precautions: Fall Restrictions Weight Bearing Restrictions: No      Mobility  Bed Mobility Overal bed mobility: Modified Independent                  Transfers Overall transfer level: Needs assistance Equipment used: 1 person hand held assist Transfers: Sit to/from Stand;Stand Pivot Transfers Sit to Stand: Min guard Stand pivot transfers: Min guard       General transfer comment: slow labored movement using IV pole  Ambulation/Gait Ambulation/Gait assistance: Min assist;Min guard Gait Distance (Feet): 50 Feet Assistive device: IV Pole Gait Pattern/deviations: Decreased step length - right;Decreased step length - left;Decreased stride length Gait velocity: decreased   General Gait Details: slow labored cadence with frequent leaning on nearby objects for support while using IV pole, limited secondary to fatigue  Stairs            Wheelchair Mobility    Modified Rankin (Stroke Patients Only)       Balance Overall balance assessment: Needs assistance Sitting-balance support: Feet supported;No upper extremity supported Sitting balance-Leahy Scale: Good Sitting balance - Comments: seated at EOB   Standing balance support: During functional activity;Single extremity supported Standing balance-Leahy Scale: Poor Standing balance comment: fair/poor using  IV pole                             Pertinent Vitals/Pain Pain Assessment: Faces Faces Pain Scale: Hurts a little bit Pain Location: generalized pain all over, mostly in joints per patient Pain Descriptors / Indicators: Aching;Sore Pain Intervention(s): Limited activity within patient's tolerance;Monitored during session    [name] expects  to be discharged to:: Private residence Living Arrangements: Alone Available Help at Discharge: Neighbor;Available PRN/intermittently Type of Home: Apartment Home Access: Level entry     Home Layout: One level Home Equipment: Cane - single point      Prior Function Level of Independence: Independent with assistive device(s)         Comments: Household ambulator using SPC, on 2 LPM Home O2 at all times     Hand Dominance        Extremity/Trunk Assessment   Upper Extremity Assessment Upper Extremity Assessment: Generalized weakness    Lower Extremity Assessment Lower Extremity Assessment: Generalized weakness    Cervical / Trunk Assessment Cervical / Trunk Assessment: Normal  Communication   Communication: No difficulties  Cognition Arousal/Alertness: Awake/alert Behavior During Therapy: WFL for tasks assessed/performed Overall Cognitive Status: Within Functional Limits for tasks assessed                                        General Comments      Exercises     Assessment/Plan    PT Assessment Patient needs continued PT services  PT Problem List Decreased strength;Decreased activity tolerance;Decreased balance;Decreased mobility       PT Treatment Interventions Balance training;Gait training;DME instruction;Stair training;Functional mobility training;Therapeutic activities;Patient/family education;Therapeutic exercise    PT Goals (Current goals can be found in the Care Plan section)  Acute Rehab PT Goals Patient Stated Goal: return home after rehab PT Goal Formulation: With patient Time For Goal Achievement: 09/02/20 Potential to Achieve Goals: Good    Frequency Min 3X/week   Barriers to discharge        Co-evaluation               AM-PAC PT "6 Clicks" Mobility  Outcome Measure Help needed turning from your back to your side while in a flat bed without using bedrails?: None Help needed moving from lying on your back to  sitting on the side of a flat bed without using bedrails?: None Help needed moving to and from a bed to a chair (including a wheelchair)?: A Little Help needed standing up from a chair using your arms (e.g., wheelchair or bedside chair)?: A Little Help needed to walk in hospital room?: A Little Help needed climbing 3-5 steps with a railing? : A Lot 6 Click Score: 19    End of Session Equipment Utilized During Treatment: Oxygen Activity Tolerance: Patient tolerated treatment well;Patient limited by fatigue Patient left: in bed;with call bell/phone within reach Nurse Communication: Mobility status PT Visit Diagnosis: Unsteadiness on feet (R26.81);Other abnormalities of gait and mobility (R26.89);Muscle weakness (generalized) (M62.81)    Time: 5397-6734 PT Time Calculation (min) (ACUTE ONLY): 28 min   Charges:   PT Evaluation $PT Eval Moderate Complexity: 1 Mod PT Treatments $Therapeutic Activity: 23-37 mins        10:07 AM, 08/19/20 Lonell Grandchild, MPT Physical Therapist with [facility] [phone] office [phone] mobile phone

## 2020-08-19 NOTE — TOC Progression Note (Signed)
Transition of Care [facility]) - Progression Note    Patient Details  Name: Kayla Ryan MRN: [id number] Date of Birth: 11-28-1955  Transition of Care [facility]) CM/SW [place], [place] Phone Number: 08/19/2020, 4:31 PM  Clinical Narrative:    CSW visited pt in room to present bed offers of [facility] and [facility]. CSW educated pt on how to look at facilities and reviews. Pt asking if she can have a little time. CSW revisited pt an hour later to update that [facility] can no longer accept pt due to [place]. Pt states she is nervous about going to SNF and getting COVID. States that her husband was at a facility and got COVID and died due to that. Pt states she may want to do outpatient PT instead of going to facility. CSW informed pt of PT recommendation for SNF but that she could decide to accept bed offer or go home. Pt very tearful during conversation. Pt then states that "sometimes I wish I had died, it would be easier". Pt then covers head with blankets and begins crying. CSW updated Dr. Manuella Ghazi of conversation, Dr. Manuella Ghazi to put in consult for TTS to see pt. TOC to make referral to outpatient PT for pt when appropriate. TOC to follow.    Expected Discharge Plan: Skilled Nursing Facility Barriers to Discharge: Continued Medical Work up  Expected Discharge Plan and Services Expected Discharge Plan: [place] In-house Referral: Clinical Social Work Discharge Planning Services: NA Post Acute Care Choice: NA Living arrangements for the past 2 months: Apartment                 DME Arranged: N/A DME Agency: NA       HH Arranged: NA HH Agency: NA         Social Determinants of Health (SDOH) Interventions    Readmission Risk Interventions Readmission Risk Prevention Plan 08/18/2020  Transportation Screening Complete  Medication Review Press photographer) Complete  HRI or Home Care Consult Complete  SW Recovery Care/Counseling Consult Complete  Palliative Care Screening Not  Applicable  Some recent data might be hidden
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
## 2020-08-19 NOTE — Discharge Summary (Signed)
Physician Discharge Summary  Kayla Ryan HYI:[id number] DOB: [birth year] DOA: 08/17/2020  PCP: Sandi Mariscal, MD  Admit date: 08/17/2020  Discharge date: 08/20/2020  Admitted From:Home  Disposition:  Home  Recommendations for Outpatient Follow-up:  1. Follow up with PCP in 1-2 weeks 2. Continue on home medications as prior with the exception of lisinopril 3. Wears 2 L nasal cannula oxygen chronically  Home Health: None  Equipment/Devices: None  Discharge Condition:Stable  CODE STATUS: Full  Diet recommendation: Heart Healthy  Brief/Interim Summary: [age] y.o.female,with history of scoliosis, polymyositis, muscular dystrophy, IBS, hyperlipidemia, fibromyalgia, emphysema, and more presents to the ED with a chief complaint of chest pain and dizziness.  Patient was admitted with AKI and noted baseline creatinine of 1.0.  This appears to be in the setting of left poor oral intake and use of lisinopril.  She was also noted to be profoundly hyperkalemic which has now improved with medications.  Creatinine levels improved considerably with IV fluid hydration.  She is noted to have significant weakness at home and is unable to perform most of her ADLs.  PT evaluation reveals need for SNF and patient is agreeable to this and discharge.  She will need close follow-up with repeat labs in the outpatient setting, but is otherwise stable for discharge.  No other acute events noted during this admission.  Discharge Diagnoses:  Active Problems:   AKI (acute kidney injury) ([place])  Principal discharge diagnosis: Prerenal AKI with hyperkalemia.  Discharge Instructions  Discharge Instructions    Diet - low sodium heart healthy   Complete by: As directed    Increase activity slowly   Complete by: As directed      Allergies as of 08/20/2020      Reactions   Sulfa Antibiotics Shortness Of Breath, Swelling, Rash   Dilaudid [hydromorphone Hcl]    Makes me crazy   Metronidazole  Diarrhea, Nausea And Vomiting   Prednisone Other (See Comments)   angry angry   Cephalexin Diarrhea, Nausea And Vomiting   Methotrexate Derivatives Swelling, Rash   Morphine And Related Anxiety   "exreme mood swings"      Medication List    TAKE these medications   acetaminophen 325 MG tablet Commonly known as: TYLENOL Take 2 tablets (650 mg total) by mouth every 4 (four) hours as needed for headache or mild pain.   albuterol 108 (90 Base) MCG/ACT inhaler Commonly known as: ProAir HFA Inhale 2 puffs into the lungs every 6 (six) hours as needed for wheezing or shortness of breath.   cetirizine 10 MG tablet Commonly known as: ZYRTEC Take 10 mg by mouth daily.   Coenzyme Q10 10 MG capsule Take 40 mg by mouth daily. 4 capsules po qd   diclofenac sodium 1 % Gel Commonly known as: VOLTAREN Apply 2 g topically 4 (four) times daily as needed (for pain).   diltiazem 120 MG 24 hr capsule Commonly known as: Cardizem CD Take 1 capsule (120 mg total) by mouth daily.   doxepin 10 MG capsule Commonly known as: SINEQUAN Take 10 mg by mouth at bedtime.   DULoxetine 60 MG capsule Commonly known as: CYMBALTA Take 1 capsule (60 mg total) by mouth 2 (two) times daily.   folic acid 1 MG tablet Commonly known as: FOLVITE Take 1 tablet (1 mg total) by mouth daily.   furosemide 40 MG tablet Commonly known as: Lasix Take 1 tablet (40 mg total) by mouth daily.   gabapentin 300 MG capsule Commonly known as:  NEURONTIN Take 300 mg by mouth 3 (three) times daily.   gemfibrozil 600 MG tablet Commonly known as: LOPID Take 600 mg by mouth 2 (two) times daily before a meal.   ipratropium-albuterol 0.5-2.5 (3) MG/3ML Soln Commonly known as: DUONEB Take 3 mLs by nebulization every 4 (four) hours as needed.   isosorbide mononitrate 30 MG 24 hr tablet Commonly known as: IMDUR Take 1 tablet (30 mg total) by mouth daily.   levothyroxine 25 MCG tablet Commonly known as: SYNTHROID Take 1  tablet (25 mcg total) by mouth daily at 6 (six) AM.   LORazepam 0.5 MG tablet Commonly known as: ATIVAN Take 1 tablet (0.5 mg total) by mouth 3 (three) times daily as needed for anxiety. What changed: reasons to take this   magnesium hydroxide 400 MG/5ML suspension Commonly known as: MILK OF MAGNESIA Take 15 mLs by mouth at bedtime.   Melatonin 10 MG Tbcr Take 60 mg by mouth at bedtime.   multivitamin capsule Take 1 capsule by mouth daily.   Nitrostat 0.4 MG SL tablet Generic drug: nitroGLYCERIN Place 0.4 mg under the tongue every 15 (fifteen) minutes as needed for chest pain.   omeprazole 40 MG capsule Commonly known as: PRILOSEC Take 1 capsule (40 mg total) by mouth daily.   oxyCODONE 5 MG immediate release tablet Commonly known as: Oxy IR/ROXICODONE Take 1 tablet (5 mg total) by mouth every 4 (four) hours as needed for moderate pain or severe pain. What changed:   how much to take  how to take this  when to take this  reasons to take this   Stiolto Respimat 2.5-2.5 MCG/ACT Aers Generic drug: Tiotropium Bromide-Olodaterol Inhale 2 puffs into the lungs daily.   topiramate 100 MG tablet Commonly known as: TOPAMAX Take 100 mg by mouth daily.   valACYclovir 500 MG tablet Commonly known as: VALTREX Take 500 mg by mouth 2 (two) times daily.   vitamin B-12 1000 MCG tablet Commonly known as: CYANOCOBALAMIN Take 1 tablet (1,000 mcg total) by mouth daily.   Vitamin D (Ergocalciferol) 1.25 MG (50000 UNIT) Caps capsule Commonly known as: DRISDOL Take 1 Units by mouth every 7 (seven) days.   Xtampza ER 18 MG C12a Generic drug: oxyCODONE ER Take 1 capsule by mouth 2 (two) times daily.       Follow-up Information    [facility] [place]. Call.   Why: Call to request a Medicaid insurance card Contact information: [place],  [address] [phone] ext. [phone]. [phone]       Sandi Mariscal, MD Follow up in 1 week(s).    Specialty: Internal Medicine Contact information: [place] [address] [phone]              Allergies  Allergen Reactions  . Sulfa Antibiotics Shortness Of Breath, Swelling and Rash  . Dilaudid [Hydromorphone Hcl]     Makes me crazy  . Metronidazole Diarrhea and Nausea And Vomiting  . Prednisone Other (See Comments)    angry angry   . Cephalexin Diarrhea and Nausea And Vomiting  . Methotrexate Derivatives Swelling and Rash  . Morphine And Related Anxiety    "exreme mood swings"    Consultations:  None   Procedures/Studies: CT Angio Chest PE W/Cm &/Or Wo Cm  Result Date: 07/24/2020 CLINICAL DATA:  Cough and shortness of breath for 2 weeks. EXAM: CT ANGIOGRAPHY CHEST WITH CONTRAST TECHNIQUE: Multidetector CT imaging of the chest was performed using the standard  protocol during bolus administration of intravenous contrast. Multiplanar CT image reconstructions and MIPs were obtained to evaluate the vascular anatomy. CONTRAST:  141m OMNIPAQUE IOHEXOL 350 MG/ML SOLN COMPARISON:  Chest radiograph earlier today. FINDINGS: Cardiovascular: There are no filling defects within the pulmonary arteries to suggest pulmonary embolus. No aortic dissection or acute aortic abnormality upper normal heart size. No pericardial effusion. Cardiac motion limits assessment for coronary artery calcifications. Mediastinum/Nodes: Small bilateral hilar lymph nodes are not enlarged by size criteria. Occasional small mediastinal nodes. There is an 11 mm right thyroid nodule. Not clinically significant; no follow-up imaging recommended (ref: J Am Coll Radiol. 2015 Feb;12(2): 143-50).No esophageal wall thickening. Lungs/Pleura: Moderate emphysema. There is mild smooth septal thickening at the bases suggesting pulmonary edema. No significant pleural fluid. The subsegmental atelectasis or scarring in the lingula. In the posterior right upper lobe there is a 7 x 9 mm nodule (8 mm mean  diameter) on image 52 of series 8. There also a small subpleural nodules in the right lower lobe, series 8, image 123. Upper Abdomen: Assessed on concurrent abdominal CT, reported separately. Musculoskeletal: Thoracic spondylosis. There are no acute or suspicious osseous abnormalities. Review of the MIP images confirms the above findings. IMPRESSION: 1. No pulmonary embolus. 2. Smooth septal thickening suggestive of pulmonary edema. Upper normal heart size. 3. Moderate emphysema. 4. Right upper lobe pulmonary nodule measuring 8 mm mean diameter, with additional small subpleural nodules in the right lower lobe. Non-contrast chest CT at 3-6 months is recommended. If the nodules are stable at time of repeat CT, then future CT at 18-24 months (from today's scan) is considered optional for low-risk patients, but is recommended for high-risk patients. This recommendation follows the consensus statement: Guidelines for Management of Incidental Pulmonary Nodules Detected on CT Images: From the Fleischner Society 2017; Radiology 2017; 284:228-243. Aortic Atherosclerosis (ICD10-I70.0) and Emphysema (ICD10-J43.9). Electronically Signed   By: MKeith RakeM.D.   On: 07/24/2020 16:01   CT Cervical Spine Wo Contrast  Result Date: 08/17/2020 CLINICAL DATA:  Fall 2 days ago with neck pain. EXAM: CT CERVICAL SPINE WITHOUT CONTRAST TECHNIQUE: Multidetector CT imaging of the cervical spine was performed without intravenous contrast. Multiplanar CT image reconstructions were also generated. COMPARISON:  Cervical spine CT 10/31/2019 FINDINGS: Alignment: Stable anterolisthesis of C4 on C5, likely facet mediated. No traumatic subluxation. Skull base and vertebrae: No acute fracture. Vertebral body heights are maintained. The dens and skull base are intact. Soft tissues and spinal canal: No prevertebral fluid or swelling. No visible canal hematoma. Disc levels: Mild disc space narrowing and endplate spurring at CD5-H2and C5-C6.  Facet hypertrophy is most prominent at C4-C5. Upper chest: There are small lymph nodes in the subcutaneous fat of the right neck measuring up to 7 mm. Mild perinodal edema. Other: None. IMPRESSION: 1. Mild degenerative change in the cervical spine without acute fracture or subluxation. 2. Small lymph nodes in the subcutaneous fat of the right neck measuring up to 7 mm with mild perinodal edema. Findings are nonspecific, lymph adenitis is considered. Recommend correlation for focal tenderness. Electronically Signed   By: MKeith RakeM.D.   On: 08/17/2020 22:38   CT Abdomen Pelvis W Contrast  Result Date: 07/24/2020 CLINICAL DATA:  Acute abdominal pain.  Nonlocalized GI bleed. EXAM: CT ABDOMEN AND PELVIS WITH CONTRAST TECHNIQUE: Multidetector CT imaging of the abdomen and pelvis was performed using the standard protocol following bolus administration of intravenous contrast. CONTRAST:  1068mOMNIPAQUE IOHEXOL 350 MG/ML SOLN COMPARISON:  Most recent abdominal CT 08/13/2018 FINDINGS: Lower chest: Assessed on concurrent chest CT, reported separately. Septal thickening at the bases. Hepatobiliary: Focal fatty infiltration adjacent to the falciform ligament. No suspicious lesion. Clips in the gallbladder fossa postcholecystectomy. No biliary dilatation. Pancreas: No ductal dilatation or inflammation. Spleen: Normal in size without focal abnormality. Adrenals/Urinary Tract: Normal adrenal glands. No hydronephrosis or perinephric edema. Chronic extrarenal pelvis configuration of the left kidney. Homogeneous renal enhancement with symmetric excretion on delayed phase imaging. Tiny cortical hypodensity in the lower left kidney is too small to accurately characterize, not significantly changed from prior. Urinary bladder is distended without wall thickening. Stomach/Bowel: The stomach is decompressed. Normal positioning of the duodenum and ligament of Treitz. Short segment of prominent small bowel that are  fluid-filled with air-fluid levels in the left upper quadrant. No associated bowel wall thickening or pneumatosis. No mesenteric edema. Remaining small bowel is decompressed. Appendix tentatively visualized and normal, for example series 2, image 55. mild colonic tortuosity with small volume of colonic stool. There is no colonic wall thickening or inflammation. Distal descending and sigmoid diverticulosis without diverticulitis. Vascular/Lymphatic: Moderate aortic and branch atherosclerosis. Mesenteric vessels are patent. The portal vein is patent. There is no mesenteric or portal venous gas. No enlarged lymph nodes in the abdomen or pelvis. Reproductive: Status post hysterectomy. No adnexal masses. Other: Small fat containing umbilical hernia. No ascites. No free air. Mild dependent subcutaneous soft tissue edema. Musculoskeletal: There are no acute or suspicious osseous abnormalities. There are transverse process fractures on the right at L1 through L3, and on the left at L3, but are remote. IMPRESSION: 1. Short segment of prominent fluid-filled small bowel with air-fluid levels in the left upper quadrant, may represent enteritis or focal ileus. 2. Distal colonic diverticulosis without diverticulitis. 3. Distended urinary bladder without wall thickening. Aortic Atherosclerosis (ICD10-I70.0). Electronically Signed   By: Keith Rake M.D.   On: 07/24/2020 16:09   DG Chest Portable 1 View  Result Date: 08/17/2020 CLINICAL DATA:  Shortness of breath and chest pain after receiving COVID booster, fall 2 days prior, neck and shoulder pain, also recent diagnosis of CHF EXAM: PORTABLE CHEST 1 VIEW COMPARISON:  CT 07/24/2020, radiograph 07/24/2020 FINDINGS: Mother some mildly coarsened interstitial changes, features are significantly diminished from prior exam, much of which is attributable to emphysematous changes seen on comparison study. A previously seen right upper lobe pulmonary nodule is difficult to  visualize on radiography, certainly better seen on CT imaging though possibly projecting at the level of the right fourth rib mild near the mediastinal margins. No pneumothorax. No effusion. Cardiomediastinal contours are unremarkable. No acute osseous or soft tissue abnormality. Telemetry leads overlie the chest. IMPRESSION: 1. No acute cardiopulmonary abnormality. Diminished edematous changes from comparison study. 2. Emphysematous features. 3. Previously seen right upper lobe pulmonary nodule is difficult to visualize on radiography, certainly better seen on CT imaging. See follow-up recommendations on CT exam dated 07/24/2020. Electronically Signed   By: Lovena Le M.D.   On: 08/17/2020 22:25   DG Chest Port 1 View  Result Date: 07/24/2020 CLINICAL DATA:  Cough and shortness of breath for the past few weeks. EXAM: PORTABLE CHEST 1 VIEW COMPARISON:  Chest x-ray dated Jan 02, 2020. FINDINGS: The heart size and mediastinal contours are within normal limits. New diffuse interstitial thickening with Kerley B-lines at the lung bases. No focal consolidation, pleural effusion, or pneumothorax. No acute osseous abnormality. IMPRESSION: 1. Interstitial pulmonary edema. Electronically Signed   By: Titus Dubin  M.D.   On: 07/24/2020 11:28   ECHOCARDIOGRAM COMPLETE  Result Date: 07/26/2020    ECHOCARDIOGRAM REPORT   Patient Name:   TIAIRA ARAMBULA Gobin Date of Exam: 07/26/2020 Medical Rec #:  [id number]      Height:       69.0 in Accession #:    [id number]     Weight:       184.3 lb Date of Birth:  April 06, 1956      BSA:          1.995 m Patient Age:    66 years       BP:           140/64 mmHg Patient Gender: F              HR:           78 bpm. Exam Location:  [facility] Procedure: 2D Echo Indications:    CHF-Acute Systolic Q82.50  History:        Patient has prior history of Echocardiogram examinations, most                 recent 06/08/2019. CHF, COPD, Arrythmias:Atrial Fibrillation,                  Signs/Symptoms:Syncope and Chest Pain; Risk Factors:Former                 Smoker, Hypertension and Dyslipidemia. Carotid Artery Disease.  Sonographer:    Leavy Cella RDCS (AE) Referring Phys: [place]  1. Left ventricular ejection fraction, by estimation, is 70 to 75%. The left ventricle has hyperdynamic function. The left ventricle has no regional wall motion abnormalities. There is mild left ventricular hypertrophy. Left ventricular diastolic parameters were normal.  2. Right ventricular systolic function is normal. The right ventricular size is normal.  3. The mitral valve is normal in structure. No evidence of mitral valve regurgitation. No evidence of mitral stenosis.  4. The aortic valve is tricuspid. Aortic valve regurgitation is not visualized. No aortic stenosis is present.  5. The inferior vena cava is normal in size with greater than 50% respiratory variability, suggesting right atrial pressure of 3 mmHg. FINDINGS  Left Ventricle: Left ventricular ejection fraction, by estimation, is 70 to 75%. The left ventricle has hyperdynamic function. The left ventricle has no regional wall motion abnormalities. The left ventricular internal cavity size was normal in size. There is mild left ventricular hypertrophy. Left ventricular diastolic parameters were normal. Right Ventricle: The right ventricular size is normal. No increase in right ventricular wall thickness. Right ventricular systolic function is normal. Left Atrium: Left atrial size was normal in size. Right Atrium: Right atrial size was normal in size. Pericardium: There is no evidence of pericardial effusion. Mitral Valve: The mitral valve is normal in structure. No evidence of mitral valve regurgitation. No evidence of mitral valve stenosis. Tricuspid Valve: The tricuspid valve is normal in structure. Tricuspid valve regurgitation is not demonstrated. No evidence of tricuspid stenosis. Aortic Valve: The aortic valve is  tricuspid. Aortic valve regurgitation is not visualized. No aortic stenosis is present. Aortic valve mean gradient measures 6.8 mmHg. Aortic valve peak gradient measures 13.8 mmHg. Aortic valve area, by VTI measures 1.79  cm. Pulmonic Valve: The pulmonic valve was not well visualized. Pulmonic valve regurgitation is not visualized. No evidence of pulmonic stenosis. Aorta: The aortic root is normal in size and structure. Pulmonary Artery: Indeterminant PASP, inadequate TR jet. Venous: The inferior vena cava  is normal in size with greater than 50% respiratory variability, suggesting right atrial pressure of 3 mmHg. IAS/Shunts: The interatrial septum was not well visualized.  LEFT VENTRICLE PLAX 2D LVIDd:         3.99 cm  Diastology LVIDs:         2.87 cm  LV e' medial:    10.00 cm/s LV PW:         1.13 cm  LV E/e' medial:  9.0 LV IVS:        1.02 cm  LV e' lateral:   8.05 cm/s LVOT diam:     1.90 cm  LV E/e' lateral: 11.1 LV SV:         70 LV SV Index:   35 LVOT Area:     2.84 cm  RIGHT VENTRICLE RV S prime:     16.60 cm/s TAPSE (M-mode): 2.8 cm LEFT ATRIUM             Index       RIGHT ATRIUM           Index LA diam:        3.60 cm 1.80 cm/m  RA Area:     13.80 cm LA Vol (A2C):   42.4 ml 21.25 ml/m RA Volume:   37.60 ml  18.84 ml/m LA Vol (A4C):   40.0 ml 20.05 ml/m LA Biplane Vol: 42.9 ml 21.50 ml/m  AORTIC VALVE AV Area (Vmax):    1.65 cm AV Area (Vmean):   1.88 cm AV Area (VTI):     1.79 cm AV Vmax:           185.96 cm/s AV Vmean:          123.144 cm/s AV VTI:            0.390 m AV Peak Grad:      13.8 mmHg AV Mean Grad:      6.8 mmHg LVOT Vmax:         108.40 cm/s LVOT Vmean:        81.577 cm/s LVOT VTI:          0.247 m LVOT/AV VTI ratio: 0.63  AORTA Ao Root diam: 2.20 cm MITRAL VALVE MV Area (PHT): 2.05 cm    SHUNTS MV Decel Time: 370 msec    Systemic VTI:  0.25 m MV E velocity: 89.50 cm/s  Systemic Diam: 1.90 cm MV A velocity: 86.10 cm/s MV E/A ratio:  1.04 Carlyle Dolly MD Electronically signed  by Carlyle Dolly MD Signature Date/Time: 07/26/2020/12:24:16 PM    Final      Discharge Exam: Vitals:   08/19/20 2115 08/20/20 0446  BP: (!) 123/48 103/62  Pulse: 71 60  Resp: 16 19  Temp: (!) 97.4 F (36.3 C) 98.6 F (37 C)  SpO2: 100% 97%   Vitals:   08/19/20 2004 08/19/20 2018 08/19/20 2115 08/20/20 0446  BP: 116/61  (!) 123/48 103/62  Pulse: 65  71 60  Resp: 20  16 19   Temp: 98.7 F (37.1 C)  (!) 97.4 F (36.3 C) 98.6 F (37 C)  TempSrc: Oral     SpO2: 98% 91% 100% 97%  Weight:      Height:        General: Pt is alert, awake, not in acute distress Cardiovascular: RRR, S1/S2 +, no rubs, no gallops Respiratory: CTA bilaterally, no wheezing, no rhonchi, chronically on 2 L nasal cannula oxygen Abdominal: Soft, NT, ND, bowel sounds + Extremities: no edema,  no cyanosis    The results of significant diagnostics from this hospitalization (including imaging, microbiology, ancillary and laboratory) are listed below for reference.     Microbiology: Recent Results (from the past 240 hour(s))  Resp Panel by RT-PCR (Flu A&B, Covid) Nasopharyngeal Swab     Status: None   Collection Time: 08/17/20  9:25 PM   Specimen: Nasopharyngeal Swab; Nasopharyngeal(NP) swabs in vial transport medium  Result Value Ref Range Status   SARS Coronavirus 2 by RT PCR NEGATIVE NEGATIVE Final    Comment: (NOTE) SARS-CoV-2 target nucleic acids are NOT DETECTED.  The SARS-CoV-2 RNA is generally detectable in upper respiratory specimens during the acute phase of infection. The lowest concentration of SARS-CoV-2 viral copies this assay can detect is 138 copies/mL. A negative result does not preclude SARS-Cov-2 infection and should not be used as the sole basis for treatment or other patient management decisions. A negative result may occur with  improper specimen collection/handling, submission of specimen other than nasopharyngeal swab, presence of viral mutation(s) within the areas targeted  by this assay, and inadequate number of viral copies(<138 copies/mL). A negative result must be combined with clinical observations, patient history, and epidemiological information. The expected result is Negative.  Fact Sheet for Patients:  EntrepreneurPulse.com.au  Fact Sheet for Healthcare Providers:  IncredibleEmployment.be  This test is no t yet approved or cleared by the [place] FDA and  has been authorized for detection and/or diagnosis of SARS-CoV-2 by FDA under an Emergency Use Authorization (EUA). This EUA will remain  in effect (meaning this test can be used) for the duration of the COVID-19 declaration under Section 564(b)(1) of the Act, 21 U.S.C.section 360bbb-3(b)(1), unless the authorization is terminated  or revoked sooner.       Influenza A by PCR NEGATIVE NEGATIVE Final   Influenza B by PCR NEGATIVE NEGATIVE Final    Comment: (NOTE) The Xpert Xpress SARS-CoV-2/FLU/RSV plus assay is intended as an aid in the diagnosis of influenza from Nasopharyngeal swab specimens and should not be used as a sole basis for treatment. Nasal washings and aspirates are unacceptable for Xpert Xpress SARS-CoV-2/FLU/RSV testing.  Fact Sheet for Patients: EntrepreneurPulse.com.au  Fact Sheet for Healthcare Providers: IncredibleEmployment.be  This test is not yet approved or cleared by the [place] FDA and has been authorized for detection and/or diagnosis of SARS-CoV-2 by FDA under an Emergency Use Authorization (EUA). This EUA will remain in effect (meaning this test can be used) for the duration of the COVID-19 declaration under Section 564(b)(1) of the Act, 21 U.S.C. section 360bbb-3(b)(1), unless the authorization is terminated or revoked.  Performed at [facility], [address], [place], [place] [address]      Labs: BNP (last 3 results) Recent Labs    01/02/20 2203 07/24/20 1101   BNP 37.0 371.6*   Basic Metabolic Panel: Recent Labs  Lab 08/17/20 2112 08/17/20 2340 08/18/20 0216 08/18/20 0511 08/18/20 0855 08/18/20 1311 08/19/20 0427 08/20/20 0458  NA 126* 124*  --  131*  --   --  138 137  K >7.5* 7.0*   < > 4.9 4.9 4.5 4.5 4.3  CL 92* 93*  --  93*  --   --  104 107  CO2 23 23  --  24  --   --  26 24  GLUCOSE 104* 98  --  97  --   --  97 95  BUN 52* 51*  --  51*  --   --  31*  17  CREATININE 3.26* 3.16*  --  2.90*  --   --  1.53* 1.19*  CALCIUM 8.4* 7.7*  --  8.4*  --   --  8.4* 8.7*  MG  --   --   --   --   --   --  2.7*  --    < > = values in this interval not displayed.   Liver Function Tests: No results for input(s): AST, ALT, ALKPHOS, BILITOT, PROT, ALBUMIN in the last 168 hours. No results for input(s): LIPASE, AMYLASE in the last 168 hours. No results for input(s): AMMONIA in the last 168 hours. CBC: Recent Labs  Lab 08/17/20 2112 08/18/20 0855 08/19/20 0427 08/20/20 0458  WBC 11.5* 8.6 5.5 4.6  NEUTROABS  --  6.3  --   --   HGB 9.4* 7.9* 7.7* 7.9*  HCT 31.2* 26.7* 27.1* 27.8*  MCV 85.0 85.3 86.9 88.0  PLT 429* 379 353 342   Cardiac Enzymes: No results for input(s): CKTOTAL, CKMB, CKMBINDEX, TROPONINI in the last 168 hours. BNP: Invalid input(s): POCBNP CBG: No results for input(s): GLUCAP in the last 168 hours. D-Dimer No results for input(s): DDIMER in the last 72 hours. Hgb A1c No results for input(s): HGBA1C in the last 72 hours. Lipid Profile No results for input(s): CHOL, HDL, LDLCALC, TRIG, CHOLHDL, LDLDIRECT in the last 72 hours. Thyroid function studies Recent Labs    08/18/20 0511  TSH 0.671   Anemia work up No results for input(s): VITAMINB12, FOLATE, FERRITIN, TIBC, IRON, RETICCTPCT in the last 72 hours. Urinalysis    Component Value Date/Time   COLORURINE STRAW (A) 06/08/2019 0103   APPEARANCEUR CLEAR 06/08/2019 0103   LABSPEC 1.005 06/08/2019 0103   PHURINE 7.0 06/08/2019 0103   GLUCOSEU NEGATIVE  06/08/2019 0103   HGBUR NEGATIVE 06/08/2019 0103   BILIRUBINUR NEGATIVE 06/08/2019 0103   KETONESUR NEGATIVE 06/08/2019 0103   PROTEINUR NEGATIVE 06/08/2019 0103   UROBILINOGEN 0.2 06/08/2011 1400   NITRITE NEGATIVE 06/08/2019 0103   LEUKOCYTESUR NEGATIVE 06/08/2019 0103   Sepsis Labs Invalid input(s): PROCALCITONIN,  WBC,  LACTICIDVEN Microbiology Recent Results (from the past 240 hour(s))  Resp Panel by RT-PCR (Flu A&B, Covid) Nasopharyngeal Swab     Status: None   Collection Time: 08/17/20  9:25 PM   Specimen: Nasopharyngeal Swab; Nasopharyngeal(NP) swabs in vial transport medium  Result Value Ref Range Status   SARS Coronavirus 2 by RT PCR NEGATIVE NEGATIVE Final    Comment: (NOTE) SARS-CoV-2 target nucleic acids are NOT DETECTED.  The SARS-CoV-2 RNA is generally detectable in upper respiratory specimens during the acute phase of infection. The lowest concentration of SARS-CoV-2 viral copies this assay can detect is 138 copies/mL. A negative result does not preclude SARS-Cov-2 infection and should not be used as the sole basis for treatment or other patient management decisions. A negative result may occur with  improper specimen collection/handling, submission of specimen other than nasopharyngeal swab, presence of viral mutation(s) within the areas targeted by this assay, and inadequate number of viral copies(<138 copies/mL). A negative result must be combined with clinical observations, patient history, and epidemiological information. The expected result is Negative.  Fact Sheet for Patients:  EntrepreneurPulse.com.au  Fact Sheet for Healthcare Providers:  IncredibleEmployment.be  This test is no t yet approved or cleared by the [place] FDA and  has been authorized for detection and/or diagnosis of SARS-CoV-2 by FDA under an Emergency Use Authorization (EUA). This EUA will remain  in effect (meaning this  test can be used) for  the duration of the COVID-19 declaration under Section 564(b)(1) of the Act, 21 U.S.C.section 360bbb-3(b)(1), unless the authorization is terminated  or revoked sooner.       Influenza A by PCR NEGATIVE NEGATIVE Final   Influenza B by PCR NEGATIVE NEGATIVE Final    Comment: (NOTE) The Xpert Xpress SARS-CoV-2/FLU/RSV plus assay is intended as an aid in the diagnosis of influenza from Nasopharyngeal swab specimens and should not be used as a sole basis for treatment. Nasal washings and aspirates are unacceptable for Xpert Xpress SARS-CoV-2/FLU/RSV testing.  Fact Sheet for Patients: EntrepreneurPulse.com.au  Fact Sheet for Healthcare Providers: IncredibleEmployment.be  This test is not yet approved or cleared by the [place] FDA and has been authorized for detection and/or diagnosis of SARS-CoV-2 by FDA under an Emergency Use Authorization (EUA). This EUA will remain in effect (meaning this test can be used) for the duration of the COVID-19 declaration under Section 564(b)(1) of the Act, 21 U.S.C. section 360bbb-3(b)(1), unless the authorization is terminated or revoked.  Performed at [facility], [address], [place], [place] [address]      Time coordinating discharge: 35 minutes  SIGNED:   Rodena Goldmann, DO [facility] 08/20/2020, 10:29 AM  If 7PM-7AM, please contact night-coverage [email or web address]

## 2020-08-19 NOTE — Care Management (Signed)
RE: Kayla Ryan DOB: October 03, 1955 Date: 08/19/2020 MUST ID: [id number]  To Whom It May Concern:  Please be advised the above name patient will require a short-term nursing home stay--anticipated 30 days or less rehabilitation and strengthening. The plan is for return home.

## 2020-08-19 NOTE — Progress Notes (Signed)
TTS consult performed

## 2020-08-19 NOTE — TOC Progression Note (Addendum)
Transition of Care [facility]) - Progression Note   Patient Details  Name: Kayla Ryan MRN: [id number] Date of Birth: 1956/06/28  Transition [facility]) CM/SW [place], LCSW Phone Number: 08/19/2020, 11:39 AM  Clinical Narrative: PT evaluation recommended SNF. CSW spoke with patient and patient is agreeable to being [place] to [place] as patient only has Medicaid. FL2 completed; PASRR pending and requested documents have been uploaded. Initial referral faxed out. TOC awaiting bed offers and PASRR number.  Addendum: CSW received PASRR #: [id number].  Expected Discharge Plan: Skilled Nursing Facility Barriers to Discharge: Continued Medical Work up  Expected Discharge Plan and Services Expected Discharge Plan: [place] In-house Referral: Clinical Social Work Discharge Planning Services: NA Post Acute Care Choice: NA Living arrangements for the past 2 months: Apartment             DME Arranged: N/A DME Agency: NA HH Arranged: NA HH Agency: NA  Readmission Risk Interventions Readmission Risk Prevention Plan 08/18/2020  Transportation Screening Complete  Medication Review Press photographer) Complete  HRI or Home Care Consult Complete  SW Recovery Care/Counseling Consult Complete  Palliative Care Screening Not Applicable  Some recent data might be hidden

## 2020-08-19 NOTE — NC FL2 (Signed)
[place] LEVEL OF CARE SCREENING TOOL     IDENTIFICATION  Patient Name: Kayla Ryan Birthdate: January 15, 1956 Sex: female Admission Date (Current Location): 08/17/2020  [place] and [place] Number:  [place] [facility] [phone] [place] and Address:  [facility] [address] [place]      Provider Number: [id number]  Attending Physician Name and Address:  Rodena Goldmann, DO  Relative Name and Phone Number:  Electa Sniff (brother) Ph: [phone]    Current Level of Care: Hospital Recommended Level of Care: [place] Prior Approval Number:    Date Approved/Denied:   PASRR Number:    Discharge Plan: SNF    Current Diagnoses: Patient Active Problem List   Diagnosis Date Noted  . AKI (acute kidney injury) ([place]) 08/18/2020  . Hypothyroidism 07/26/2020  . Acute respiratory failure with hypoxia ([place]) 07/26/2020  . B12 and Folate deficiency 07/26/2020  . Folate deficiency anemia--B12 AND Folate deficiency 07/26/2020  . Atrial fibrillation with RVR ([place]) 07/25/2020  . Acute on chronic anemia 07/24/2020  . Leukocytosis 07/24/2020  . Pulmonary nodules 07/24/2020  . Acute CHF (congestive heart failure) ([place]) 07/24/2020  . Dyspnea 01/31/2020  . Anemia 01/03/2020  . Syncope 06/08/2019  . Hypokalemia 06/08/2019  . Trigger finger, acquired 11/03/2017  . Chronic obstructive pulmonary disease/Emphysema 08/23/2017  . Chronic pain 08/23/2017  . History of Crohn's disease 08/23/2017  . Myopathy 08/23/2017  . Right shoulder injury, subsequent encounter 08/23/2017  . Primary insomnia 02/01/2017  . Esophageal dysphagia 10/06/2016  . Essential hypertension 07/17/2016  . Iron deficiency anemia 03/25/2016  . History of Bell's palsy 03/03/2015  . Nuclear sclerosis of both eyes 03/03/2015  . Allergic rhinitis 05/18/2014  . Anxiety 05/18/2014  . Arthritis 05/18/2014  . Asthma 05/18/2014  . Chronic constipation 05/18/2014  .  Dermatomyositis ([place]) 05/18/2014  . Migraine without status migrainosus, not intractable 05/18/2014  . Rectocele 05/18/2014  . 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 Peripheral neuropathy 05/18/2014  . Chest pain 10/30/2013  . Hyperlipidemia 10/30/2013  . Carotid artery disease ([place]) 10/30/2013    Orientation RESPIRATION BLADDER Height & Weight     Self,Time,Situation,Place  O2 (2L/min) Continent Weight: 172 lb (78 kg) Height:  5' 9"  (175.3 cm)  BEHAVIORAL SYMPTOMS/MOOD NEUROLOGICAL BOWEL NUTRITION STATUS      Continent Diet (Heart healthy)  AMBULATORY STATUS COMMUNICATION OF NEEDS Skin   Limited Assist Verbally Normal                       Personal Care Assistance Level of Assistance  Bathing,Feeding,Dressing Bathing Assistance: Limited assistance Feeding assistance: Independent Dressing Assistance: Limited assistance     Functional Limitations Info  Sight,Hearing,Speech Sight Info: Adequate Hearing Info: Adequate Speech Info: Adequate    SPECIAL CARE FACTORS FREQUENCY  PT (By licensed PT)     PT Frequency: 5x's/week              Contractures Contractures Info: Not present    Additional Factors Info  Code Status,Allergies,Psychotropic Code Status Info: Full Allergies Info: Sulfa Antibiotics; Dilaudid (Hydromorphone Hcl); Metronidazole; Prednisone; Cephalexin; Methotrexate Derivatives; Morphine And Related Psychotropic Info: Cymbalta (duloxetine); Neurontin (gabapentin)         Current Medications (08/19/2020):  This is the current hospital active medication list Current Facility-Administered Medications  Medication Dose Route Frequency Provider Last Rate Last Admin  . 0.9 %  sodium chloride infusion   Intravenous Continuous Heath Lark D, DO 100 mL/hr at 08/19/20 [phone]  New Bag at 08/19/20 0842  . acetaminophen (TYLENOL) tablet 650 mg  650 mg Oral Q6H PRN Zierle-Ghosh, Asia B, DO       Or  . acetaminophen (TYLENOL) suppository 650 mg   650 mg Rectal Q6H PRN Zierle-Ghosh, Asia B, DO      . albuterol (VENTOLIN HFA) 108 (90 Base) MCG/ACT inhaler 2 puff  2 puff Inhalation Q6H PRN Manuella Ghazi, Pratik D, DO      . diltiazem (CARDIZEM CD) 24 hr capsule 120 mg  120 mg Oral Daily Zierle-Ghosh, Asia B, DO   120 mg at 08/19/20 0841  . DULoxetine (CYMBALTA) DR capsule 60 mg  60 mg Oral BID Zierle-Ghosh, Asia B, DO   60 mg at 08/19/20 0840  . folic acid (FOLVITE) tablet 1 mg  1 mg Oral Daily Zierle-Ghosh, Asia B, DO   1 mg at 08/19/20 0840  . gabapentin (NEURONTIN) capsule 300 mg  300 mg Oral TID Zierle-Ghosh, Asia B, DO   300 mg at 08/19/20 0840  . gemfibrozil (LOPID) tablet 600 mg  600 mg Oral BID AC Zierle-Ghosh, Asia B, DO   600 mg at 08/19/20 0840  . heparin injection 5,000 Units  5,000 Units Subcutaneous Q8H Zierle-Ghosh, Asia B, DO   5,000 Units at 08/19/20 0527  . ipratropium-albuterol (DUONEB) 0.5-2.5 (3) MG/3ML nebulizer solution 3 mL  3 mL Nebulization Q4H PRN Zierle-Ghosh, Asia B, DO      . isosorbide mononitrate (IMDUR) 24 hr tablet 30 mg  30 mg Oral Daily Zierle-Ghosh, Asia B, DO   30 mg at 08/19/20 0840  . levothyroxine (SYNTHROID) tablet 25 mcg  25 mcg Oral Q0600 Zierle-Ghosh, Asia B, DO   25 mcg at 08/19/20 0527  . melatonin tablet 9 mg  9 mg Oral QHS Shah, Pratik D, DO   9 mg at 08/18/20 2232  . oxyCODONE (Oxy IR/ROXICODONE) immediate release tablet 5 mg  5 mg Oral Q4H PRN Zierle-Ghosh, Asia B, DO   5 mg at 08/18/20 1820  . oxyCODONE ER C12A 18 mg  18 mg Oral QHS Shah, Pratik D, DO   18 mg at 08/18/20 2232  . pantoprazole (PROTONIX) EC tablet 40 mg  40 mg Oral Daily Zierle-Ghosh, Asia B, DO   40 mg at 08/19/20 0841  . polyethylene glycol (MIRALAX / GLYCOLAX) packet 17 g  17 g Oral BID Zierle-Ghosh, Asia B, DO   17 g at 08/19/20 0841  . prochlorperazine (COMPAZINE) injection 10 mg  10 mg Intravenous Once Zierle-Ghosh, Asia B, DO      . sodium bicarbonate injection 50 mEq  50 mEq Intravenous Once Zierle-Ghosh, Asia B, DO      .  vitamin B-12 (CYANOCOBALAMIN) tablet 1,000 mcg  1,000 mcg Oral Daily Zierle-Ghosh, Asia B, DO   1,000 mcg at 08/19/20 0840     Discharge Medications: Please see discharge summary for a list of discharge medications.  Relevant Imaging Results:  Relevant Lab Results:   Additional Information SSN: [id number]  Sherie Don, LCSW

## 2020-08-19 NOTE — BH Assessment (Signed)
Clinician informed nurse Lawernce Keas of patient being psych cleared.  Also informed of the need for referral for outpatient counseling.

## 2020-08-20 DIAGNOSIS — N179 Acute kidney failure, unspecified: Secondary | ICD-10-CM | POA: Diagnosis not present

## 2020-08-20 LAB — BASIC METABOLIC PANEL
Anion gap: 6 (ref 5–15)
BUN: 17 mg/dL (ref 8–23)
CO2: 24 mmol/L (ref 22–32)
Calcium: 8.7 mg/dL — ABNORMAL LOW (ref 8.9–10.3)
Chloride: 107 mmol/L (ref 98–111)
Creatinine, Ser: 1.19 mg/dL — ABNORMAL HIGH (ref 0.44–1.00)
GFR, Estimated: 50 mL/min — ABNORMAL LOW (ref >60)
Glucose, Bld: 95 mg/dL (ref 70–99)
Potassium: 4.3 mmol/L (ref 3.5–5.1)
Sodium: 137 mmol/L (ref 135–145)

## 2020-08-20 LAB — CBC
HCT: 27.8 % — ABNORMAL LOW (ref 36.0–46.0)
Hemoglobin: 7.9 g/dL — ABNORMAL LOW (ref 12.0–15.0)
MCH: 25 pg — ABNORMAL LOW (ref 26.0–34.0)
MCHC: 28.4 g/dL — ABNORMAL LOW (ref 30.0–36.0)
MCV: 88 fL (ref 80.0–100.0)
Platelets: 342 10*3/uL (ref 150–400)
RBC: 3.16 MIL/uL — ABNORMAL LOW (ref 3.87–5.11)
RDW: 21.8 % — ABNORMAL HIGH (ref 11.5–15.5)
WBC: 4.6 10*3/uL (ref 4.0–10.5)
nRBC: 0 % (ref 0.0–0.2)

## 2020-08-20 MED ORDER — OXYCODONE HCL ER 20 MG PO T12A: 20.0000 mg | EXTENDED_RELEASE_TABLET | Freq: Two times a day (BID) | ORAL | Status: DC

## 2020-08-20 NOTE — TOC Transition Note (Signed)
Transition of Care [facility]) - CM/SW Discharge Note  Patient Details  Name: Kayla Ryan MRN: [id number] Date of Birth: 04/17/1956  [facility] CM/SW Contact:  Sherie Don, LCSW Phone Number: 08/20/2020, 11:58 AM  Clinical Narrative: Patient is psych cleared and wants to do OP PT rather than SNF and is interested in [facility] services. CSW received consent to make referrals for PCS care, CAP program, OP PT, and OP [facility] in [place]. CSW made referral to Neoma Laming with [facility]. Appointment scheduled for 08/31/2020 at 1:00pm with [facility]. CSW made referral to [facility] and a form will be faxed to patient's PCP to request services. CSW completed CAP referral and faxed it to [facility] Medicaid. OP PT referral made. Services added to AVS. CSW discussed these with patient. TOC signing off.  Final next level of care: OP Rehab Barriers to Discharge: Barriers Resolved  Patient Goals and CMS Choice Patient states their goals for this hospitalization and ongoing recovery are:: Be able to complete ADLs better CMS Medicare.gov Compare Post Acute Care list provided to:: Patient Choice offered to / list presented to : Patient  Discharge Plan and Services In-house Referral: Clinical Social Work Discharge Planning Services: NA Post Acute Care Choice: NA          DME Arranged: N/A DME Agency: NA HH Arranged: NA HH Agency: NA  Readmission Risk Interventions Readmission Risk Prevention Plan 08/18/2020  Transportation Screening Complete  Medication Review Press photographer) Complete  HRI or Home Care Consult Complete  SW Recovery Care/Counseling Consult Complete  Palliative Care Screening Not Applicable  Some recent data might be hidden

## 2020-08-20 NOTE — Progress Notes (Signed)
Patient seen and evaluated today.  She is noted to have some significant depression into the evening with some potential suicidal ideation for which TTS evaluation was performed with no need for inpatient psychiatric hospitalization currently noted.  She will need to follow-up within the [facility] behavioral health facility in the outpatient setting and this will be arranged for her.  She is currently stable for discharge and creatinine levels have returned to baseline.  She states that she does not want to go to skilled facility and would rather follow-up with outpatient PT.  No other acute events noted this admission.  Total care time: 20 minutes.

## 2020-08-20 NOTE — Progress Notes (Signed)
Patient states understanding of discharge instructions.  

## 2020-08-25 LAB — ALDOSTERONE + RENIN ACTIVITY W/ RATIO
ALDO / PRA Ratio: 1.2 (ref 0.0–30.0)
Aldosterone: 9 ng/dL (ref 0.0–30.0)
PRA LC/MS/MS: 7.516 ng/mL/hr — ABNORMAL HIGH (ref 0.167–5.380)

## 2020-08-31 ENCOUNTER — Other Ambulatory Visit: Payer: Self-pay

## 2020-08-31 ENCOUNTER — Ambulatory Visit (INDEPENDENT_AMBULATORY_CARE_PROVIDER_SITE_OTHER): Payer: Medicare Other | Admitting: Psychiatry

## 2020-08-31 ENCOUNTER — Encounter (HOSPITAL_COMMUNITY): Payer: Self-pay | Admitting: Psychiatry

## 2020-08-31 DIAGNOSIS — F321 Major depressive disorder, single episode, moderate: Secondary | ICD-10-CM
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
## 2020-09-01 ENCOUNTER — Ambulatory Visit (HOSPITAL_COMMUNITY): Payer: Medicaid Other | Admitting: Physical Therapy

## 2020-09-03 ENCOUNTER — Other Ambulatory Visit: Payer: Self-pay

## 2020-09-03 ENCOUNTER — Ambulatory Visit (HOSPITAL_COMMUNITY): Payer: Medicare Other | Attending: Internal Medicine | Admitting: Physical Therapy

## 2020-09-03 ENCOUNTER — Encounter (HOSPITAL_COMMUNITY): Payer: Self-pay | Admitting: Physical Therapy

## 2020-09-03 DIAGNOSIS — M6281 Muscle weakness (generalized): Secondary | ICD-10-CM | POA: Diagnosis present

## 2020-09-03 DIAGNOSIS — R296 Repeated falls: Secondary | ICD-10-CM | POA: Insufficient documentation

## 2020-09-03 DIAGNOSIS — R2681 Unsteadiness on feet: Secondary | ICD-10-CM | POA: Insufficient documentation

## 2020-09-03 NOTE — Progress Notes (Signed)
   09/03/20 0001  Assessment  Medical Diagnosis Falls/ weakness  Referring Provider (PT) Pratik Manuella Ghazi DO  Onset Date/Surgical Date  (Chronic)  Prior Therapy For frozen shoulder, none for balance  Precautions  Precautions Fall  Restrictions  Weight Bearing Restrictions No  Balance Screen  Has the patient fallen in the past 6 months Yes  How many times? 20  Has the patient had a decrease in activity level because of a fear of falling?  Yes  Is the patient reluctant to leave their home because of a fear of falling?  Yes  Home Environment  Living Environment Other (Comment) (HUD for seniors)  Living Arrangements Alone  Prior Function  Level of Independence Independent with basic ADLs  Cognition  Overall Cognitive Status Within Functional Limits for tasks assessed  Observation/Other Assessments  Focus on Therapeutic Outcomes (FOTO)  NA  Transfers  Five time sit to stand comments  29.5 seconds with UEs, very unsteady  Ambulation/Gait  Ambulation/Gait Yes  Ambulation/Gait Assistance 5: Supervision  Ambulation Distance (Feet) 100 Feet  Assistive device None  Gait Pattern Decreased arm swing - right;Decreased arm swing - left;Decreased step length - right;Decreased step length - left;Decreased stride length;Narrow base of support  Ambulation Surface Level;Indoor  Gait Comments 2MWT (unable to complete due to fatigue)  Balance  Balance Assessed Yes  Static Standing Balance  Static Standing Balance -  Activities  Single Leg Stance - Right Leg;Single Leg Stance - Left Leg;Tandam Stance - Right Leg;Tandam Stance - Left Leg;Romberg - Eyes Opened  Static Standing - Comment/# of Minutes 10 sec, 10 sec, 15 sec, 10 sec, 10 sec (able to keep balance but with mod sway)

## 2020-09-03 NOTE — Therapy (Signed)
[facility] [address] [place], [place], [address] Phone: [phone]   Fax:  [phone]  Physical Therapy Evaluation  Patient Details  Name: Kayla Ryan MRN: [id number] Date of Birth: Nov 25, 1955 Referring Provider (PT): Pratik Manuella Ghazi DO   Encounter Date: 09/03/2020   PT End of Session - 09/03/20 1341    Visit Number 1    Number of Visits 8    Date for PT Re-Evaluation 10/02/20    Authorization Type Medicaid [place]    Authorization Time Period check auth    PT Start Time 1305    PT Stop Time 1345    PT Time Calculation (min) 40 min    Equipment Utilized During Treatment Oxygen   2L O2   Activity Tolerance Patient tolerated treatment well;Patient limited by fatigue    Behavior During Therapy [facility] for tasks assessed/performed           Past Medical History:  Diagnosis Date  . Anemia   . Arthritis   . Asthma   . Chest pain   . Chronic bronchitis   . Congestive heart failure (CHF) ([place])   . COPD (chronic obstructive pulmonary disease) ([place])   . Crohn disease ([place])   . Emphysema   . Emphysema of lung ([place])   . Fibromyalgia   . Herpes   . Hyperlipidemia   . IBS (irritable bowel syndrome)   . Kidney failure   . Migraines   . Muscular dystrophy ([place])   . Neck pain   . Plantar fasciitis   . Polymyositis ([place])   . Scoliosis     Past Surgical History:  Procedure Laterality Date  . ABDOMINAL HYSTERECTOMY    . bone spur    . CHOLECYSTECTOMY    . HERNIA REPAIR    . LEFT HEART CATHETERIZATION WITH CORONARY ANGIOGRAM N/A 11/07/2013   Procedure: LEFT HEART CATHETERIZATION WITH CORONARY ANGIOGRAM;  Surgeon: Lorretta Harp, MD;  Location: [facility] CATH LAB;  Service: Cardiovascular;  Laterality: N/A;  . MASTECTOMY PARTIAL / LUMPECTOMY    . OOPHORECTOMY    . ROTATOR CUFF REPAIR      There were no vitals filed for this visit.    Subjective Assessment - 09/03/20 1315    Subjective Patient presents to physical therapy with  complaint of weakness and recent falls. Patient reports history of muscular dystrophy, anemia, fibromyalgia, and peripheral neuropathy. Patient says her weakness is chronic in nature and she felt this way for years. She describes this seems to come in waves and depends on other ailments she may incur. She also notes having recent change in medications which she says contributed to dizziness.    Pertinent History anemia, MD, fibro, fibro, COPD, emphysema, asthma, peripheral neuropathy    How long can you stand comfortably? 10 min    How long can you walk comfortably? 5 min    Patient Stated Goals Be able to clean my own house and bath myself    Currently in Pain? Yes    Pain Score 4    global "about every joint in my body"   Aggravating Factors  variable due to chronic widespread pain    Pain Relieving Factors Medication    Effect of Pain on Daily Activities Limits              [facility] PT Assessment - 09/03/20 0001      Assessment   Medical Diagnosis Falls/ weakness    Referring Provider (PT) [place] DO  Onset Date/Surgical Date --   Chronic   Prior Therapy For frozen shoulder, none for balance      Precautions   Precautions Fall      Restrictions   Weight Bearing Restrictions No      Balance Screen   Has the patient fallen in the past 6 months Yes    How many times? 20    Has the patient had a decrease in activity level because of a fear of falling?  Yes    Is the patient reluctant to leave their home because of a fear of falling?  Yes      Home Environment   Living Environment Other (Comment)   HUD for seniors   Living Arrangements Alone      Prior Function   Level of Independence Independent with basic ADLs      Cognition   Overall Cognitive Status Within Functional Limits for tasks assessed      Observation/Other Assessments   Focus on Therapeutic Outcomes (FOTO)  NA      Transfers   Five time sit to stand comments  29.5 seconds with UEs, very unsteady       Ambulation/Gait   Ambulation/Gait Yes    Ambulation/Gait Assistance 5: Supervision    Ambulation Distance (Feet) 100 Feet    Assistive device None    Gait Pattern Decreased arm swing - right;Decreased arm swing - left;Decreased step length - right;Decreased step length - left;Decreased stride length;Narrow base of support    Ambulation Surface Level;Indoor    Gait Comments 2MWT (unable to complete due to fatigue)      Balance   Balance Assessed Yes      Static Standing Balance   Static Standing Balance -  Activities  Single Leg Stance - Right Leg;Single Leg Stance - Left Leg;Tandam Stance - Right Leg;Tandam Stance - Left Leg;Romberg - Eyes Opened    Static Standing - Comment/# of Minutes 10 sec, 10 sec, 15 sec, 10 sec, 10 sec (able to keep balance but with mod sway)                      Objective measurements completed on examination: See above findings.               PT Education - 09/03/20 1318    Education Details on evalution findings, POC and HEP    Person(s) Educated Patient    Methods Explanation;Handout    Comprehension Verbalized understanding            PT Short Term Goals - 09/03/20 1340      PT SHORT TERM GOAL #1   Title Patient will be independent with initial HEP and self-management strategies to improve functional outcomes    Time 2    Period Weeks    Status New    Target Date 09/18/20             PT Long Term Goals - 09/03/20 1658      PT LONG TERM GOAL #1   Title Patient will be able to ambulate at least 275 feet during 2MWT with LRAD to demonstrate improved ability to perform functional mobility and associated tasks.    Time 4    Period Weeks    Status New    Target Date 10/02/20      PT LONG TERM GOAL #2   Title Patient will be able to perform stand x 5 in < 15 seconds to demonstrate improvement  in functional mobility and reduced risk for falls.    Time 4    Period Weeks    Status New    Target Date 10/02/20       PT LONG TERM GOAL #3   Title Patient will be able to maintain tandem stance >30 seconds on BLEs to improve stability and reduce risk for falls    Time 4    Period Weeks    Status New    Target Date 10/02/20      PT LONG TERM GOAL #4   Title Patient will be able to stand >15 minutes for improved ability to perform cooking, grooming, cleaning tasks and ADLs.    Time 4    Period Weeks    Status New    Target Date 10/02/20                  Plan - 09/03/20 1337    Clinical Impression Statement Patient is a 65 y.o. female who presents to physical therapy with complaint of balance issues and leg weakness. Patient demonstrates decreased strength, balance deficits and gait abnormalities which are negatively impacting patient ability to perform ADLs and functional mobility tasks. Patient will benefit from skilled physical therapy services to address these deficits to improve level of function with ADLs, functional mobility tasks, and reduce risk for falls.    Personal Factors and Comorbidities Comorbidity 3+    Examination-Activity Limitations Bathing;Stand;Transfers;Locomotion Level    Examination-Participation Restrictions Community Activity;Cleaning    Stability/Clinical Decision Making Evolving/Moderate complexity    Clinical Decision Making Moderate    Rehab Potential Fair    PT Frequency 2x / week    PT Duration 4 weeks    PT Treatment/Interventions ADLs/Self Care Home Management;Aquatic Therapy;Biofeedback;Cryotherapy;Canalith Repostioning;Electrical Stimulation;Iontophoresis 71m/ml Dexamethasone;Moist Heat;Traction;Balance training;Therapeutic exercise;Manual techniques;Manual lymph drainage;Vestibular;Vasopneumatic Device;Wheelchair mobility training;Taping;Splinting;Prosthetic Training;Functional mobility training;Therapeutic activities;Stair training;Orthotic Fit/Training;Gait training;Patient/family education;DME Instruction;Passive range of motion;Dry needling;Energy  conservation;Joint Manipulations;Spinal Manipulations;Visual/perceptual remediation/compensation;Scar mobilization;Compression bandaging;Neuromuscular re-education;Ultrasound;Parrafin;Fluidtherapy;Contrast Bath;Cognitive remediation    PT Next Visit Plan Review goals. Progressing functional LE strenght and balance as tolerated. Progress activity tolerance wiht standing and gait as able .    PT Home Exercise Plan Issue next visit (too fatigued at eval)    Consulted and Agree with Plan of Care Patient           Patient will benefit from skilled therapeutic intervention in order to improve the following deficits and impairments:  Abnormal gait,Decreased endurance,Cardiopulmonary status limiting activity,Decreased activity tolerance,Decreased strength,Pain,Decreased balance,Decreased mobility,Difficulty walking,Impaired perceived functional ability  Visit Diagnosis: Repeated falls  Muscle weakness (generalized)  Unsteadiness on feet     Problem List Patient Active Problem List   Diagnosis Date Noted  . AKI (acute kidney injury) ([place] 08/18/2020  . Hypothyroidism 07/26/2020  . Acute respiratory failure with hypoxia ([place] 07/26/2020  . B12 and Folate deficiency 07/26/2020  . Folate deficiency anemia--B12 AND Folate deficiency 07/26/2020  . Atrial fibrillation with RVR ([place] 07/25/2020  . Acute on chronic anemia 07/24/2020  . Leukocytosis 07/24/2020  . Pulmonary nodules 07/24/2020  . Acute CHF (congestive heart failure) ([place] 07/24/2020  . Dyspnea 01/31/2020  . Anemia 01/03/2020  . Syncope 06/08/2019  . Hypokalemia 06/08/2019  . Trigger finger, acquired 11/03/2017  . Chronic obstructive pulmonary disease/Emphysema 08/23/2017  . Chronic pain 08/23/2017  . History of Crohn's disease 08/23/2017  . Myopathy 08/23/2017  . Right shoulder injury, subsequent encounter 08/23/2017  . Primary insomnia 02/01/2017  . Esophageal dysphagia 10/06/2016  . Essential hypertension 07/17/2016  .  Iron deficiency anemia 03/25/2016  . History of Bell's palsy 03/03/2015  . Nuclear sclerosis of both eyes 03/03/2015  . Allergic rhinitis 05/18/2014  . Anxiety 05/18/2014  . Arthritis 05/18/2014  . Asthma 05/18/2014  . Chronic constipation 05/18/2014  . Dermatomyositis ([place]) 05/18/2014  . Migraine without status migrainosus, not intractable 05/18/2014  . Rectocele 05/18/2014  . 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 Peripheral neuropathy 05/18/2014  . Chest pain 10/30/2013  . Hyperlipidemia 10/30/2013  . Carotid artery disease ([place]) 10/30/2013    5:03 PM, 09/03/20 Josue Hector PT DPT  Physical Therapist with [facility]  [phone] [place] [address] [place], [place], [address] Phone: [phone]   Fax:  [phone]  Name: Kayla Ryan MRN: [id number] Date of Birth: [birth year]

## 2020-09-08 DIAGNOSIS — G4700 Insomnia, unspecified: Secondary | ICD-10-CM | POA: Diagnosis not present

## 2020-09-08 DIAGNOSIS — M5136 Other intervertebral disc degeneration, lumbar region: Secondary | ICD-10-CM | POA: Diagnosis not present

## 2020-09-08 DIAGNOSIS — G894 Chronic pain syndrome: Secondary | ICD-10-CM | POA: Diagnosis not present

## 2020-09-08 DIAGNOSIS — M5440 Lumbago with sciatica, unspecified side: Secondary | ICD-10-CM | POA: Diagnosis not present

## 2020-09-09 ENCOUNTER — Ambulatory Visit: Payer: Medicaid Other | Admitting: Emergency Medicine

## 2020-09-14 ENCOUNTER — Ambulatory Visit (INDEPENDENT_AMBULATORY_CARE_PROVIDER_SITE_OTHER): Payer: Medicare Other | Admitting: Psychiatry

## 2020-09-14 ENCOUNTER — Other Ambulatory Visit: Payer: Self-pay

## 2020-09-14 DIAGNOSIS — F321 Major depressive disorder, single episode, moderate: Secondary | ICD-10-CM | POA: Diagnosis not present
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
## 2020-09-15 DIAGNOSIS — R0602 Shortness of breath: Secondary | ICD-10-CM | POA: Diagnosis not present

## 2020-09-16 ENCOUNTER — Telehealth (HOSPITAL_COMMUNITY): Payer: Self-pay | Admitting: Physical Therapy

## 2020-09-16 ENCOUNTER — Ambulatory Visit (HOSPITAL_COMMUNITY): Payer: Medicare Other | Admitting: Physical Therapy

## 2020-09-16 DIAGNOSIS — Z789 Other specified health status: Secondary | ICD-10-CM | POA: Diagnosis not present

## 2020-09-16 DIAGNOSIS — J449 Chronic obstructive pulmonary disease, unspecified: Secondary | ICD-10-CM | POA: Diagnosis not present

## 2020-09-16 DIAGNOSIS — I2510 Atherosclerotic heart disease of native coronary artery without angina pectoris: Secondary | ICD-10-CM | POA: Diagnosis not present

## 2020-09-16 NOTE — Telephone Encounter (Signed)
pt cancelled appt for today because she has CHF and her legs are very swollen today

## 2020-09-18 ENCOUNTER — Ambulatory Visit (HOSPITAL_COMMUNITY): Payer: Medicare Other

## 2020-09-21 ENCOUNTER — Ambulatory Visit (HOSPITAL_COMMUNITY): Payer: Medicare Other

## 2020-09-21 ENCOUNTER — Telehealth (HOSPITAL_COMMUNITY): Payer: Self-pay

## 2020-09-21 NOTE — Telephone Encounter (Signed)
PT CALLED TO CX TODAY'S APPT DUE TO SHE IS NOT FEELING WELL

## 2020-09-24 ENCOUNTER — Telehealth (HOSPITAL_COMMUNITY): Payer: Self-pay | Admitting: Physical Therapy

## 2020-09-24 ENCOUNTER — Ambulatory Visit (HOSPITAL_COMMUNITY): Payer: Medicare Other | Admitting: Physical Therapy

## 2020-09-24 NOTE — Telephone Encounter (Signed)
Pt is still sick and can not come in today - will return Monday

## 2020-09-26 DIAGNOSIS — J449 Chronic obstructive pulmonary disease, unspecified: Secondary | ICD-10-CM | POA: Diagnosis not present

## 2020-09-28 ENCOUNTER — Other Ambulatory Visit: Payer: Self-pay

## 2020-09-28 ENCOUNTER — Telehealth (HOSPITAL_COMMUNITY): Payer: Self-pay

## 2020-09-28 ENCOUNTER — Ambulatory Visit (HOSPITAL_COMMUNITY): Payer: Medicare Other | Attending: Internal Medicine

## 2020-09-28 ENCOUNTER — Ambulatory Visit (INDEPENDENT_AMBULATORY_CARE_PROVIDER_SITE_OTHER): Payer: Medicare Other | Admitting: Psychiatry

## 2020-09-28 DIAGNOSIS — F321 Major depressive disorder, single episode, moderate: Secondary | ICD-10-CM | POA: Diagnosis not present
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
## 2020-09-28 NOTE — Telephone Encounter (Signed)
Called and spoke with patient and she had mistakenly made the wrong appointment date.  Patient updated in her current appointments and requests cancellation of date Feb. 24 at 14:30 and will resume 10/05/20.  3:27 PM, 09/28/20 M. Sherlyn Lees, PT, DPT Physical Therapist- [place] Office Number: [phone]

## 2020-09-30 ENCOUNTER — Ambulatory Visit: Payer: Medicaid Other | Admitting: Emergency Medicine

## 2020-10-01 ENCOUNTER — Ambulatory Visit (HOSPITAL_COMMUNITY): Payer: Medicare Other | Admitting: Physical Therapy

## 2020-10-05 ENCOUNTER — Ambulatory Visit (HOSPITAL_COMMUNITY): Payer: Medicare Other

## 2020-10-05 ENCOUNTER — Telehealth (HOSPITAL_COMMUNITY): Payer: Self-pay

## 2020-10-05 NOTE — Telephone Encounter (Signed)
pt called to cx today's appt due to she is not feeling well

## 2020-10-08 ENCOUNTER — Ambulatory Visit (HOSPITAL_COMMUNITY): Payer: Medicare Other | Admitting: Physical Therapy

## 2020-10-09 DIAGNOSIS — R300 Dysuria: Secondary | ICD-10-CM | POA: Diagnosis not present

## 2020-10-09 DIAGNOSIS — M5440 Lumbago with sciatica, unspecified side: Secondary | ICD-10-CM | POA: Diagnosis not present

## 2020-10-09 DIAGNOSIS — D539 Nutritional anemia, unspecified: Secondary | ICD-10-CM | POA: Diagnosis not present

## 2020-10-09 DIAGNOSIS — M129 Arthropathy, unspecified: Secondary | ICD-10-CM | POA: Diagnosis not present

## 2020-10-09 DIAGNOSIS — Z9181 History of falling: Secondary | ICD-10-CM | POA: Diagnosis not present

## 2020-10-09 DIAGNOSIS — M5136 Other intervertebral disc degeneration, lumbar region: Secondary | ICD-10-CM | POA: Diagnosis not present

## 2020-10-09 DIAGNOSIS — Z0000 Encounter for general adult medical examination without abnormal findings: Secondary | ICD-10-CM | POA: Diagnosis not present

## 2020-10-09 DIAGNOSIS — I10 Essential (primary) hypertension: Secondary | ICD-10-CM | POA: Diagnosis not present

## 2020-10-09 DIAGNOSIS — Z20822 Contact with and (suspected) exposure to covid-19: Secondary | ICD-10-CM | POA: Diagnosis not present

## 2020-10-09 DIAGNOSIS — R5383 Other fatigue: Secondary | ICD-10-CM | POA: Diagnosis not present

## 2020-10-09 DIAGNOSIS — Z79899 Other long term (current) drug therapy: Secondary | ICD-10-CM | POA: Diagnosis not present

## 2020-10-09 DIAGNOSIS — E7800 Pure hypercholesterolemia, unspecified: Secondary | ICD-10-CM | POA: Diagnosis not present

## 2020-10-09 DIAGNOSIS — E559 Vitamin D deficiency, unspecified: Secondary | ICD-10-CM | POA: Diagnosis not present

## 2020-10-12 ENCOUNTER — Emergency Department (HOSPITAL_COMMUNITY): Payer: Medicare Other

## 2020-10-12 ENCOUNTER — Other Ambulatory Visit: Payer: Self-pay

## 2020-10-12 ENCOUNTER — Ambulatory Visit (HOSPITAL_COMMUNITY): Payer: Medicaid Other | Admitting: Psychiatry

## 2020-10-12 ENCOUNTER — Encounter (HOSPITAL_COMMUNITY): Payer: Self-pay | Admitting: Emergency Medicine

## 2020-10-12 ENCOUNTER — Emergency Department (HOSPITAL_COMMUNITY)
Admission: EM | Admit: 2020-10-12 | Discharge: 2020-10-12 | Disposition: A | Discharge status: Home / Self Care | Payer: Medicare Other | Attending: Emergency Medicine | Admitting: Emergency Medicine

## 2020-10-12 DIAGNOSIS — I509 Heart failure, unspecified: Secondary | ICD-10-CM | POA: Diagnosis not present

## 2020-10-12 DIAGNOSIS — R110 Nausea: Secondary | ICD-10-CM | POA: Insufficient documentation

## 2020-10-12 DIAGNOSIS — E039 Hypothyroidism, unspecified: Secondary | ICD-10-CM | POA: Diagnosis not present

## 2020-10-12 DIAGNOSIS — J449 Chronic obstructive pulmonary disease, unspecified: Secondary | ICD-10-CM | POA: Insufficient documentation

## 2020-10-12 DIAGNOSIS — S60221A Contusion of right hand, initial encounter: Secondary | ICD-10-CM | POA: Diagnosis not present

## 2020-10-12 DIAGNOSIS — Z87891 Personal history of nicotine dependence: Secondary | ICD-10-CM | POA: Diagnosis not present

## 2020-10-12 DIAGNOSIS — W01198A Fall on same level from slipping, tripping and stumbling with subsequent striking against other object, initial encounter: Secondary | ICD-10-CM | POA: Insufficient documentation

## 2020-10-12 DIAGNOSIS — Z79899 Other long term (current) drug therapy: Secondary | ICD-10-CM | POA: Diagnosis not present

## 2020-10-12 DIAGNOSIS — W19XXXA Unspecified fall, initial encounter: Secondary | ICD-10-CM

## 2020-10-12 DIAGNOSIS — S63501A Unspecified sprain of right wrist, initial encounter: Secondary | ICD-10-CM

## 2020-10-12 DIAGNOSIS — M79641 Pain in right hand: Secondary | ICD-10-CM | POA: Insufficient documentation

## 2020-10-12 DIAGNOSIS — S0990XA Unspecified injury of head, initial encounter: Secondary | ICD-10-CM | POA: Diagnosis not present

## 2020-10-12 DIAGNOSIS — M79601 Pain in right arm: Secondary | ICD-10-CM | POA: Diagnosis not present

## 2020-10-12 DIAGNOSIS — R519 Headache, unspecified: Secondary | ICD-10-CM | POA: Insufficient documentation

## 2020-10-12 DIAGNOSIS — M79631 Pain in right forearm: Secondary | ICD-10-CM | POA: Insufficient documentation

## 2020-10-12 DIAGNOSIS — R220 Localized swelling, mass and lump, head: Secondary | ICD-10-CM | POA: Diagnosis not present

## 2020-10-12 DIAGNOSIS — S6391XA Sprain of unspecified part of right wrist and hand, initial encounter: Secondary | ICD-10-CM | POA: Diagnosis not present

## 2020-10-12 DIAGNOSIS — J45909 Unspecified asthma, uncomplicated: Secondary | ICD-10-CM | POA: Diagnosis not present

## 2020-10-12 DIAGNOSIS — I110 Hypertensive heart disease with heart failure: Secondary | ICD-10-CM | POA: Insufficient documentation

## 2020-10-12 MED ORDER — ONDANSETRON 8 MG PO TBDP
8.0000 mg | ORAL_TABLET | Freq: Once | ORAL | Status: AC
  Administered 2020-10-12: 8 mg via ORAL
  Filled 2020-10-12: qty 1

## 2020-10-12 NOTE — ED Provider Notes (Signed)
[facility] Provider Note   CSN: [id number] Arrival date & time: 10/12/20  0301     History Chief Complaint  Patient presents with  . Fall    Kayla Ryan is a 65 y.o. female.  Patient is a 65 year old female with extensive past medical history including COPD, congestive heart failure on home oxygen, muscular dystrophy, fibromyalgia, irritable bowel.  Patient presents today for evaluation of fall.  She tells me she got up to go to the bathroom in the night and tripped.  She hit her head on the corner of the wall.  She denies loss of consciousness, but describes pain and swelling to the right temple.  She denies loss of consciousness.  She denies blurry vision, weakness or numbness in her extremities, but does admit to feeling nauseated.  She also describes pain in her right hand and forearm since the fall.  The history is provided by the patient.  Fall This is a new problem. The current episode started less than 1 hour ago. The problem occurs constantly. The problem has not changed since onset.Associated symptoms include headaches. Nothing aggravates the symptoms. Nothing relieves the symptoms. She has tried nothing for the symptoms.       Past Medical History:  Diagnosis Date  . Anemia   . Arthritis   . Asthma   . Chest pain   . Chronic bronchitis   . Congestive heart failure (CHF) ([place])   . COPD (chronic obstructive pulmonary disease) ([employer])   . Crohn disease ([place])   . Emphysema   . Emphysema of lung ([place])   . Fibromyalgia   . Herpes   . Hyperlipidemia   . IBS (irritable bowel syndrome)   . Kidney failure   . Migraines   . Muscular dystrophy ([place])   . Neck pain   . Plantar fasciitis   . Polymyositis ([place])   . Scoliosis     Patient Active Problem List   Diagnosis Date Noted  . AKI (acute kidney injury) ([place]) 08/18/2020  . Hypothyroidism 07/26/2020  . Acute respiratory failure with hypoxia ([place]) 07/26/2020  . B12 and Folate deficiency  07/26/2020  . Folate deficiency anemia--B12 AND Folate deficiency 07/26/2020  . Atrial fibrillation with RVR ([place]) 07/25/2020  . Acute on chronic anemia 07/24/2020  . Leukocytosis 07/24/2020  . Pulmonary nodules 07/24/2020  . Acute CHF (congestive heart failure) ([place]) 07/24/2020  . Dyspnea 01/31/2020  . Anemia 01/03/2020  . Syncope 06/08/2019  . Hypokalemia 06/08/2019  . Trigger finger, acquired 11/03/2017  . Chronic obstructive pulmonary disease/Emphysema 08/23/2017  . Chronic pain 08/23/2017  . History of Crohn's disease 08/23/2017  . Myopathy 08/23/2017  . Right shoulder injury, subsequent encounter 08/23/2017  . Primary insomnia 02/01/2017  . Esophageal dysphagia 10/06/2016  . Essential hypertension 07/17/2016  . Iron deficiency anemia 03/25/2016  . History of Bell's palsy 03/03/2015  . Nuclear sclerosis of both eyes 03/03/2015  . Allergic rhinitis 05/18/2014  . Anxiety 05/18/2014  . Arthritis 05/18/2014  . Asthma 05/18/2014  . Chronic constipation 05/18/2014  . Dermatomyositis ([place]) 05/18/2014  . Migraine without status migrainosus, not intractable 05/18/2014  . Rectocele 05/18/2014  . 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 Peripheral neuropathy 05/18/2014  . Chest pain 10/30/2013  . Hyperlipidemia 10/30/2013  . Carotid artery disease ([place]) 10/30/2013    Past Surgical History:  Procedure Laterality Date  . ABDOMINAL HYSTERECTOMY    . bone spur    . CHOLECYSTECTOMY    . HERNIA REPAIR    .  LEFT HEART CATHETERIZATION WITH CORONARY ANGIOGRAM N/A 11/07/2013   Procedure: LEFT HEART CATHETERIZATION WITH CORONARY ANGIOGRAM;  Surgeon: Lorretta Harp, MD;  Location[facility] CATH LAB;  Service: Cardiovascular;  Laterality: N/A;  . MASTECTOMY PARTIAL / LUMPECTOMY    . OOPHORECTOMY    . ROTATOR CUFF REPAIR       OB History   No obstetric history on file.     Family History  Problem Relation Age of Onset  . Lung cancer Mother   . COPD Father   . Lung  cancer Father   . Lymphoma Father   . Anxiety disorder Sister   . Depression Brother     Social History   Tobacco Use  . Smoking status: Former Smoker    Packs/day: 1.00    Years: 49.00    Pack years: 49.00    Types: Cigarettes    Start date: 08/08/1966    Quit date: 08/31/2005    Years since quitting: 15.1  . Smokeless tobacco: Never Used  Vaping Use  . Vaping Use: Never used  Substance Use Topics  . Alcohol use: No  . Drug use: No    Comment: used marijuana in teens    Home Medications Prior to Admission medications   Medication Sig Start Date End Date Taking? Authorizing Provider  acetaminophen (TYLENOL) 325 MG tablet Take 2 tablets (650 mg total) by mouth every 4 (four) hours as needed for headache or mild pain. 07/26/20   Roxan Hockey, MD  albuterol (PROAIR HFA) 108 (90 Base) MCG/ACT inhaler Inhale 2 puffs into the lungs every 6 (six) hours as needed for wheezing or shortness of breath. 07/26/20   Roxan Hockey, MD  cetirizine (ZYRTEC) 10 MG tablet Take 10 mg by mouth daily.    [provider]  Coenzyme Q10 10 MG capsule Take 40 mg by mouth daily. 4 capsules po qd    [provider]  diclofenac sodium (VOLTAREN) 1 % GEL Apply 2 g topically 4 (four) times daily as needed (for pain). 08/21/17   Hudnall, Sharyn Lull, MD  diltiazem (CARDIZEM CD) 120 MG 24 hr capsule Take 1 capsule (120 mg total) by mouth daily. Patient not taking: Reported on 08/31/2020 07/26/20 07/26/21  Roxan Hockey, MD  doxepin (SINEQUAN) 10 MG capsule Take 10 mg by mouth at bedtime. 10/11/13   [provider]  DULoxetine (CYMBALTA) 60 MG capsule Take 1 capsule (60 mg total) by mouth 2 (two) times daily. 07/26/20   Roxan Hockey, MD  folic acid (FOLVITE) 1 MG tablet Take 1 tablet (1 mg total) by mouth daily. 07/27/20   Roxan Hockey, MD  furosemide (LASIX) 40 MG tablet Take 1 tablet (40 mg total) by mouth daily. Patient not taking: Reported on 08/31/2020 07/26/20 07/26/21   Roxan Hockey, MD  gabapentin (NEURONTIN) 300 MG capsule Take 300 mg by mouth 3 (three) times daily.    [provider]  gemfibrozil (LOPID) 600 MG tablet Take 600 mg by mouth 2 (two) times daily before a meal.    [provider]  ipratropium-albuterol (DUONEB) 0.5-2.5 (3) MG/3ML SOLN Take 3 mLs by nebulization every 4 (four) hours as needed. 07/26/20   Roxan Hockey, MD  isosorbide mononitrate (IMDUR) 30 MG 24 hr tablet Take 1 tablet (30 mg total) by mouth daily. Patient not taking: Reported on 08/31/2020 07/26/20   Roxan Hockey, MD  levothyroxine (SYNTHROID) 25 MCG tablet Take 1 tablet (25 mcg total) by mouth daily at 6 (six) AM. 07/27/20   Emokpae,  Courage, MD  LORazepam (ATIVAN) 0.5 MG tablet Take 1 tablet (0.5 mg total) by mouth 3 (three) times daily as needed for anxiety. 08/19/20   Manuella Ghazi, Pratik D, DO  magnesium hydroxide (MILK OF MAGNESIA) 400 MG/5ML suspension Take 15 mLs by mouth at bedtime.    [provider]  Melatonin 10 MG TBCR Take 60 mg by mouth at bedtime.    [provider]  Multiple Vitamin (MULTIVITAMIN) capsule Take 1 capsule by mouth daily. Patient not taking: Reported on 08/31/2020    [provider]  NITROSTAT 0.4 MG SL tablet Place 0.4 mg under the tongue every 15 (fifteen) minutes as needed for chest pain.  09/22/13   [provider]  omeprazole (PRILOSEC) 40 MG capsule Take 1 capsule (40 mg total) by mouth daily. 01/03/20   Shahmehdi, Valeria Batman, MD  oxyCODONE (OXY IR/ROXICODONE) 5 MG immediate release tablet Take 1 tablet (5 mg total) by mouth every 4 (four) hours as needed for moderate pain or severe pain. 08/19/20   Manuella Ghazi, Pratik D, DO  Tiotropium Bromide-Olodaterol (STIOLTO RESPIMAT) 2.5-2.5 MCG/ACT AERS Inhale 2 puffs into the lungs daily. 07/26/20   Roxan Hockey, MD  topiramate (TOPAMAX) 100 MG tablet Take 100 mg by mouth daily. 04/28/20   [provider]  valACYclovir (VALTREX) 500 MG tablet Take 500  mg by mouth 2 (two) times daily.    [provider]  vitamin B-12 (CYANOCOBALAMIN) 1000 MCG tablet Take 1 tablet (1,000 mcg total) by mouth daily. Patient not taking: Reported on 08/31/2020 07/26/20   Roxan Hockey, MD  Vitamin D, Ergocalciferol, (DRISDOL) 50000 UNITS CAPS capsule Take 1 Units by mouth every 7 (seven) days. Patient not taking: Reported on 08/31/2020 10/18/13   [provider]  XTAMPZA ER 18 MG C12A Take 1 capsule by mouth 2 (two) times daily. 08/19/20   Manuella Ghazi, Pratik D, DO    Allergies    Sulfa antibiotics, Dilaudid [hydromorphone hcl], Iron, Metronidazole, Prednisone, Cephalexin, Methotrexate derivatives, and Morphine and related  Review of Systems   Review of Systems  Neurological: Positive for headaches.  All other systems reviewed and are negative.   Physical Exam Updated Vital Signs BP (!) 158/56 (BP Location: Left Arm)   Pulse 91   Temp 98.2 F (36.8 C) (Oral)   Resp 18   Ht 5' 9"  (1.753 m)   Wt 78 kg   SpO2 98%   BMI 25.39 kg/m   Physical Exam Vitals and nursing note reviewed.  Constitutional:      General: She is not in acute distress.    Appearance: She is well-developed and well-nourished. She is not diaphoretic.  HENT:     Head: Normocephalic and atraumatic.  Eyes:     Extraocular Movements: Extraocular movements intact.     Pupils: Pupils are equal, round, and reactive to light.  Cardiovascular:     Rate and Rhythm: Normal rate and regular rhythm.     Heart sounds: No murmur heard. No friction rub. No gallop.   Pulmonary:     Effort: Pulmonary effort is normal. No respiratory distress.     Breath sounds: Normal breath sounds. No wheezing.  Abdominal:     General: Bowel sounds are normal. There is no distension.     Palpations: Abdomen is soft.     Tenderness: There is no abdominal tenderness.  Musculoskeletal:        General: Normal range of motion.     Cervical back: Normal range of motion and neck supple.  Comments: There is tenderness to palpation over the entire right forearm and right hand.  She has pain with range of motion.  The arm/hand are neurovascularly intact.  Skin:    General: Skin is warm and dry.  Neurological:     General: No focal deficit present.     Mental Status: She is alert and oriented to person, place, and time.     Cranial Nerves: No cranial nerve deficit.     Sensory: No sensory deficit.     Motor: No weakness.     Coordination: Coordination normal.     ED Results / Procedures / Treatments   Labs (all labs ordered are listed, but only abnormal results are displayed) Labs Reviewed - No data to display  EKG None  Radiology No results found.  Procedures Procedures   Medications Ordered in ED Medications  ondansetron (ZOFRAN-ODT) disintegrating tablet 8 mg (has no administration in time range)    ED Course  I have reviewed the triage vital signs and the nursing notes.  Pertinent labs & imaging results that were available during my care of the patient were reviewed by me and considered in my medical decision making (see chart for details).    MDM Rules/Calculators/A&P  Patient presenting here with complaints of headache after striking her head when getting up to go to the bathroom in the night.  Patient is unsure of loss of consciousness, but denies any weakness, numbness, or visual disturbances.  She has a bump to the right temple, however is neurologically intact and CT scan is unremarkable.  She also has pain in the right wrist and hand, but the x-rays are unremarkable.  Patient to be discharged with Tylenol, rest, and return as needed.  Final Clinical Impression(s) / ED Diagnoses Final diagnoses:  None    Rx / DC Orders ED Discharge Orders    None       Veryl Speak, MD 10/12/20 [phone]

## 2020-10-12 NOTE — ED Triage Notes (Signed)
Pt states she got up to go to bathroom and tripped and hit her head on the corner of the wall. C/O pain to head 10/10 and nausea. Pt with knot and bruising noted to R side of forehead.

## 2020-10-12 NOTE — Discharge Instructions (Addendum)
Take Tylenol 1000 mg every 6 hours as needed for headache.  Follow-up with your primary doctor if symptoms are not improving in the next few days.

## 2020-10-14 ENCOUNTER — Ambulatory Visit (HOSPITAL_COMMUNITY): Payer: Medicare Other | Admitting: Physical Therapy

## 2020-10-16 ENCOUNTER — Ambulatory Visit: Payer: Medicaid Other | Admitting: Emergency Medicine

## 2020-10-20 ENCOUNTER — Ambulatory Visit (HOSPITAL_COMMUNITY): Payer: Self-pay | Admitting: Psychiatry

## 2020-10-20 ENCOUNTER — Other Ambulatory Visit: Payer: Self-pay

## 2020-10-24 DIAGNOSIS — J449 Chronic obstructive pulmonary disease, unspecified: Secondary | ICD-10-CM | POA: Diagnosis not present

## 2020-10-26 ENCOUNTER — Other Ambulatory Visit: Payer: Self-pay

## 2020-10-26 ENCOUNTER — Ambulatory Visit (HOSPITAL_COMMUNITY): Payer: Medicare Other | Admitting: Psychiatry

## 2020-10-26 ENCOUNTER — Ambulatory Visit: Payer: Medicaid Other | Admitting: Emergency Medicine

## 2020-10-28 ENCOUNTER — Ambulatory Visit (HOSPITAL_COMMUNITY): Payer: Medicare Other | Admitting: Physical Therapy

## 2020-11-02 DIAGNOSIS — J449 Chronic obstructive pulmonary disease, unspecified: Secondary | ICD-10-CM | POA: Diagnosis not present

## 2020-11-02 DIAGNOSIS — Z789 Other specified health status: Secondary | ICD-10-CM | POA: Diagnosis not present

## 2020-11-02 DIAGNOSIS — E7800 Pure hypercholesterolemia, unspecified: Secondary | ICD-10-CM | POA: Diagnosis not present

## 2020-11-06 DIAGNOSIS — M5136 Other intervertebral disc degeneration, lumbar region: Secondary | ICD-10-CM | POA: Diagnosis not present

## 2020-11-06 DIAGNOSIS — M5440 Lumbago with sciatica, unspecified side: Secondary | ICD-10-CM | POA: Diagnosis not present

## 2020-11-06 DIAGNOSIS — G4700 Insomnia, unspecified: Secondary | ICD-10-CM | POA: Diagnosis not present

## 2020-11-09 ENCOUNTER — Other Ambulatory Visit: Payer: Self-pay

## 2020-11-09 ENCOUNTER — Ambulatory Visit (INDEPENDENT_AMBULATORY_CARE_PROVIDER_SITE_OTHER): Payer: Medicare Other | Admitting: Psychiatry

## 2020-11-09 DIAGNOSIS — F321 Major depressive disorder, single episode, moderate: Secondary | ICD-10-CM | POA: Diagnosis not present
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
## 2020-11-12 ENCOUNTER — Ambulatory Visit (HOSPITAL_COMMUNITY): Payer: Medicare Other | Admitting: Physical Therapy

## 2020-11-23 ENCOUNTER — Ambulatory Visit (HOSPITAL_COMMUNITY): Payer: Medicare Other | Admitting: Psychiatry

## 2020-11-24 DIAGNOSIS — J449 Chronic obstructive pulmonary disease, unspecified: Secondary | ICD-10-CM | POA: Diagnosis not present

## 2020-11-27 DIAGNOSIS — Z789 Other specified health status: Secondary | ICD-10-CM | POA: Diagnosis not present

## 2020-11-27 DIAGNOSIS — E039 Hypothyroidism, unspecified: Secondary | ICD-10-CM | POA: Diagnosis not present

## 2020-11-27 DIAGNOSIS — M19019 Primary osteoarthritis, unspecified shoulder: Secondary | ICD-10-CM | POA: Diagnosis not present

## 2020-12-01 ENCOUNTER — Ambulatory Visit (HOSPITAL_COMMUNITY): Payer: Medicare Other | Admitting: Psychiatry

## 2020-12-04 ENCOUNTER — Ambulatory Visit: Payer: Medicaid Other | Admitting: Emergency Medicine

## 2020-12-04 ENCOUNTER — Telehealth: Payer: Self-pay | Admitting: Emergency Medicine

## 2020-12-04 NOTE — Telephone Encounter (Signed)
Tried to call pt but immediately received a message stating that person has VM box that has not been set up yet. Will try to call back later.

## 2020-12-07 ENCOUNTER — Telehealth: Payer: Self-pay | Admitting: Emergency Medicine

## 2020-12-07 MED ORDER — STIOLTO RESPIMAT 2.5-2.5 MCG/ACT IN AERS: 2.0000 | INHALATION_SPRAY | Freq: Every day | RESPIRATORY_TRACT | 5 refills | Status: DC

## 2020-12-07 NOTE — Telephone Encounter (Signed)
Called and spoke with pt and she stated that she was started on oxygen when she was in the hospital in late December/early Jan.  They sent her home from the hospital 2 liters/ 27/7.  She stated that she wanted to make RB aware since she is thinking of getting a POC from [facility].

## 2020-12-07 NOTE — Telephone Encounter (Signed)
Called and spoke with patient. She stated that she needed a refill on her Stiolto to be sent to [facility] in [place]. She is aware that I have sent in the refill for her.   Nothing further needed at time of call.

## 2020-12-08 NOTE — Telephone Encounter (Signed)
I called and spoke with pt and she stated that she is currently on continuous oxygen.  When she decides to try and get the POC she is aware that she will have to qualify for this and she will call to get the walk done in our office.

## 2020-12-08 NOTE — Telephone Encounter (Signed)
She needs to come in and do a walk with pulsed O2 titration if she qualifies.

## 2020-12-11 DIAGNOSIS — M5136 Other intervertebral disc degeneration, lumbar region: Secondary | ICD-10-CM | POA: Diagnosis not present

## 2020-12-11 DIAGNOSIS — G4700 Insomnia, unspecified: Secondary | ICD-10-CM | POA: Diagnosis not present

## 2020-12-11 DIAGNOSIS — M5440 Lumbago with sciatica, unspecified side: Secondary | ICD-10-CM | POA: Diagnosis not present

## 2020-12-13 ENCOUNTER — Ambulatory Visit
Admission: EM | Admit: 2020-12-13 | Discharge: 2020-12-13 | Disposition: A | Discharge status: Home / Self Care | Payer: Medicare Other | Attending: Family Medicine | Admitting: Family Medicine

## 2020-12-13 ENCOUNTER — Encounter: Payer: Self-pay | Admitting: Emergency Medicine

## 2020-12-13 ENCOUNTER — Other Ambulatory Visit: Payer: Self-pay

## 2020-12-13 DIAGNOSIS — N390 Urinary tract infection, site not specified: Secondary | ICD-10-CM | POA: Diagnosis not present

## 2020-12-13 DIAGNOSIS — J9611 Chronic respiratory failure with hypoxia: Secondary | ICD-10-CM | POA: Diagnosis not present

## 2020-12-13 DIAGNOSIS — J449 Chronic obstructive pulmonary disease, unspecified: Secondary | ICD-10-CM | POA: Diagnosis not present

## 2020-12-13 DIAGNOSIS — Z9981 Dependence on supplemental oxygen: Secondary | ICD-10-CM

## 2020-12-13 LAB — POCT URINALYSIS DIP (MANUAL ENTRY)
Glucose, UA: 100 mg/dL — AB
Nitrite, UA: POSITIVE — AB
Protein Ur, POC: >=300 mg/dL — AB
Spec Grav, UA: 1.025 (ref 1.010–1.025)
Urobilinogen, UA: 2 E.U./dL — AB
pH, UA: 7 (ref 5.0–8.0)

## 2020-12-13 MED ORDER — DOXYCYCLINE HYCLATE 100 MG PO CAPS: 100.0000 mg | ORAL_CAPSULE | Freq: Two times a day (BID) | ORAL | 0 refills | Status: DC

## 2020-12-13 NOTE — ED Provider Notes (Signed)
[facility]    CSN: [id number] Arrival date & time: 12/13/20  1550      History   Chief Complaint Chief Complaint  Patient presents with  . Urinary Tract Infection    HPI Kayla Ryan is a 65 y.o. female.   HPI  Patient with a medical history significant for COPD, congestive heart failure, and asthma presents today for evaluation of a UTI.  On arrival patient's oxygen was in the low mid 80s she reported to nurse that she is supposed to be on 3 L of oxygen however was unable to lift her oxygen tank to bring it into the clinic from her car.  Patient was immediately placed on 3 L of oxygen via nasal cannula oxygen level improved to 95%.  Patient was no longer tachypneic and stable.  Patient presents with 2 days of dysuria and flank pain.  She denies any history of recurrent UTIs although gets them on occasion.  She has a low-grade temperature of 99.1 on arrival here today.  She denies any nausea, vomiting, or new weakness.  Past Medical History:  Diagnosis Date  . Anemia   . Arthritis   . Asthma   . Chest pain   . Chronic bronchitis   . Congestive heart failure (CHF) ([place])   . COPD (chronic obstructive pulmonary disease) ([place])   . Crohn disease ([place])   . Emphysema   . Emphysema of lung ([place])   . Fibromyalgia   . Herpes   . Hyperlipidemia   . IBS (irritable bowel syndrome)   . Kidney failure   . Migraines   . Muscular dystrophy ([place])   . Neck pain   . Plantar fasciitis   . Polymyositis ([place])   . Scoliosis     Patient Active Problem List   Diagnosis Date Noted  . AKI (acute kidney injury) ([place]) 08/18/2020  . Hypothyroidism 07/26/2020  . Acute respiratory failure with hypoxia ([place]) 07/26/2020  . B12 and Folate deficiency 07/26/2020  . Folate deficiency anemia--B12 AND Folate deficiency 07/26/2020  . Atrial fibrillation with RVR ([place]) 07/25/2020  . Acute on chronic anemia 07/24/2020  . Leukocytosis 07/24/2020  . Pulmonary nodules 07/24/2020  . Acute CHF  (congestive heart failure) ([place]) 07/24/2020  . Dyspnea 01/31/2020  . Anemia 01/03/2020  . Syncope 06/08/2019  . Hypokalemia 06/08/2019  . Trigger finger, acquired 11/03/2017  . Chronic obstructive pulmonary disease/Emphysema 08/23/2017  . Chronic pain 08/23/2017  . History of Crohn's disease 08/23/2017  . Myopathy 08/23/2017  . Right shoulder injury, subsequent encounter 08/23/2017  . Primary insomnia 02/01/2017  . Esophageal dysphagia 10/06/2016  . Essential hypertension 07/17/2016  . Iron deficiency anemia 03/25/2016  . History of Bell's palsy 03/03/2015  . Nuclear sclerosis of both eyes 03/03/2015  . Allergic rhinitis 05/18/2014  . Anxiety 05/18/2014  . Arthritis 05/18/2014  . Asthma 05/18/2014  . Chronic constipation 05/18/2014  . Dermatomyositis ([place]) 05/18/2014  . Migraine without status migrainosus, not intractable 05/18/2014  . Rectocele 05/18/2014  . 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 Peripheral neuropathy 05/18/2014  . Chest pain 10/30/2013  . Hyperlipidemia 10/30/2013  . Carotid artery disease ([place]) 10/30/2013    Past Surgical History:  Procedure Laterality Date  . ABDOMINAL HYSTERECTOMY    . bone spur    . CHOLECYSTECTOMY    . HERNIA REPAIR    . LEFT HEART CATHETERIZATION WITH CORONARY ANGIOGRAM N/A 11/07/2013   Procedure: LEFT HEART CATHETERIZATION WITH CORONARY ANGIOGRAM;  Surgeon: Lorretta Harp,  MD;  Location: [facility];  Service: Cardiovascular;  Laterality: N/A;  . MASTECTOMY PARTIAL / LUMPECTOMY    . OOPHORECTOMY    . ROTATOR CUFF REPAIR      OB History   No obstetric history on file.      Home Medications    Prior to Admission medications   Medication Sig Start Date End Date Taking? Authorizing Provider  doxycycline (VIBRAMYCIN) 100 MG capsule Take 1 capsule (100 mg total) by mouth 2 (two) times daily. 12/13/20  Yes Scot Jun, FNP  acetaminophen (TYLENOL) 325 MG tablet Take 2 tablets (650 mg total) by mouth  every 4 (four) hours as needed for headache or mild pain. 07/26/20   Roxan Hockey, MD  albuterol (PROAIR HFA) 108 (90 Base) MCG/ACT inhaler Inhale 2 puffs into the lungs every 6 (six) hours as needed for wheezing or shortness of breath. 07/26/20   Roxan Hockey, MD  cetirizine (ZYRTEC) 10 MG tablet Take 10 mg by mouth daily.    [provider]  Coenzyme Q10 10 MG capsule Take 40 mg by mouth daily. 4 capsules po qd    [provider]  diclofenac sodium (VOLTAREN) 1 % GEL Apply 2 g topically 4 (four) times daily as needed (for pain). 08/21/17   Hudnall, Sharyn Lull, MD  diltiazem (CARDIZEM CD) 120 MG 24 hr capsule Take 1 capsule (120 mg total) by mouth daily. Patient not taking: Reported on 08/31/2020 07/26/20 07/26/21  Roxan Hockey, MD  doxepin (SINEQUAN) 10 MG capsule Take 10 mg by mouth at bedtime. 10/11/13   [provider]  DULoxetine (CYMBALTA) 60 MG capsule Take 1 capsule (60 mg total) by mouth 2 (two) times daily. 07/26/20   Roxan Hockey, MD  folic acid (FOLVITE) 1 MG tablet Take 1 tablet (1 mg total) by mouth daily. 07/27/20   Roxan Hockey, MD  furosemide (LASIX) 40 MG tablet Take 1 tablet (40 mg total) by mouth daily. Patient not taking: Reported on 08/31/2020 07/26/20 07/26/21  Roxan Hockey, MD  gabapentin (NEURONTIN) 300 MG capsule Take 300 mg by mouth 3 (three) times daily.    [provider]  gemfibrozil (LOPID) 600 MG tablet Take 600 mg by mouth 2 (two) times daily before a meal.    [provider]  ipratropium-albuterol (DUONEB) 0.5-2.5 (3) MG/3ML SOLN Take 3 mLs by nebulization every 4 (four) hours as needed. 07/26/20   Roxan Hockey, MD  isosorbide mononitrate (IMDUR) 30 MG 24 hr tablet Take 1 tablet (30 mg total) by mouth daily. Patient not taking: Reported on 08/31/2020 07/26/20   Roxan Hockey, MD  levothyroxine (SYNTHROID) 25 MCG tablet Take 1 tablet (25 mcg total) by mouth daily at 6 (six) AM. 07/27/20   Emokpae,  Courage, MD  LORazepam (ATIVAN) 0.5 MG tablet Take 1 tablet (0.5 mg total) by mouth 3 (three) times daily as needed for anxiety. 08/19/20   Manuella Ghazi, Pratik D, DO  magnesium hydroxide (MILK OF MAGNESIA) 400 MG/5ML suspension Take 15 mLs by mouth at bedtime.    [provider]  Melatonin 10 MG TBCR Take 60 mg by mouth at bedtime.    [provider]  Multiple Vitamin (MULTIVITAMIN) capsule Take 1 capsule by mouth daily. Patient not taking: Reported on 08/31/2020    [provider]  NITROSTAT 0.4 MG SL tablet Place 0.4 mg under the tongue every 15 (fifteen) minutes as needed for chest pain.  09/22/13   [provider]  omeprazole (PRILOSEC) 40 MG capsule Take 1  capsule (40 mg total) by mouth daily. 01/03/20   Shahmehdi, Valeria Batman, MD  oxyCODONE (OXY IR/ROXICODONE) 5 MG immediate release tablet Take 1 tablet (5 mg total) by mouth every 4 (four) hours as needed for moderate pain or severe pain. 08/19/20   Manuella Ghazi, Pratik D, DO  Tiotropium Bromide-Olodaterol (STIOLTO RESPIMAT) 2.5-2.5 MCG/ACT AERS Inhale 2 puffs into the lungs daily. 12/07/20   Collene Gobble, MD  topiramate (TOPAMAX) 100 MG tablet Take 100 mg by mouth daily. 04/28/20   [provider]  valACYclovir (VALTREX) 500 MG tablet Take 500 mg by mouth 2 (two) times daily.    [provider]  vitamin B-12 (CYANOCOBALAMIN) 1000 MCG tablet Take 1 tablet (1,000 mcg total) by mouth daily. Patient not taking: Reported on 08/31/2020 07/26/20   Roxan Hockey, MD  Vitamin D, Ergocalciferol, (DRISDOL) 50000 UNITS CAPS capsule Take 1 Units by mouth every 7 (seven) days. Patient not taking: Reported on 08/31/2020 10/18/13   [provider]  XTAMPZA ER 18 MG C12A Take 1 capsule by mouth 2 (two) times daily. 08/19/20   Heath Lark D, DO    Family History Family History  Problem Relation Age of Onset  . Lung cancer Mother   . COPD Father   . Lung cancer Father   . Lymphoma Father   . Anxiety disorder  Sister   . Depression Brother     Social History Social History   Tobacco Use  . Smoking status: Former Smoker    Packs/day: 1.00    Years: 49.00    Pack years: 49.00    Types: Cigarettes    Start date: 08/08/1966    Quit date: 08/31/2005    Years since quitting: 15.3  . Smokeless tobacco: Never Used  Vaping Use  . Vaping Use: Never used  Substance Use Topics  . Alcohol use: No  . Drug use: No    Comment: used marijuana in teens     Allergies   Sulfa antibiotics, Dilaudid [hydromorphone hcl], Iron, Metronidazole, Prednisone, Cephalexin, Methotrexate derivatives, and Morphine and related   Review of Systems Review of Systems Pertinent negatives listed in HPI  Physical Exam Triage Vital Signs ED Triage Vitals  Enc Vitals Group     BP 12/13/20 1600 137/70     Pulse Rate 12/13/20 1600 (!) 107     Resp 12/13/20 1600 19     Temp 12/13/20 1600 99.1 F (37.3 C)     Temp Source 12/13/20 1600 Oral     SpO2 12/13/20 1600 95 %     Weight --      Height --      Head Circumference --      Peak Flow --      Pain Score 12/13/20 1559 0     Pain Loc --      Pain Edu? --      Excl. in [place]? --    No data found.  Updated Vital Signs BP 137/70 (BP Location: Right Arm)   Pulse (!) 107   Temp 99.1 F (37.3 C) (Oral)   Resp 19   SpO2 95%   Visual Acuity Right Eye Distance:   Left Eye Distance:   Bilateral Distance:    Right Eye Near:   Left Eye Near:    Bilateral Near:     Physical Exam General appearance: Alert, well developed, well nourished, cooperative  Head: Normocephalic, without obvious abnormality, atraumatic Respiratory: Tachypneic without oxygen, normal respiration without accessory muscle use after 3  L of oxygen, lung sounds are grossly diminished Heart: Rate and rhythm normal.  Extremities: No gross deformities Skin: Skin color, texture, turgor normal. No rashes seen  Psych: Appropriate mood and affect. Neurologic: GCS 15, normal coordination, normal  gait, no focal neurological deficits  UC Treatments / Results  Labs (all labs ordered are listed, but only abnormal results are displayed) Labs Reviewed  POCT URINALYSIS DIP (MANUAL ENTRY) - Abnormal; Notable for the following components:      Result Value   Color, UA orange (*)    Clarity, UA cloudy (*)    Glucose, UA =100 (*)    Bilirubin, UA small (*)    Ketones, POC UA trace (5) (*)    Blood, UA large (*)    Protein Ur, POC >=300 (*)    Urobilinogen, UA 2.0 (*)    Nitrite, UA Positive (*)    Leukocytes, UA Large (3+) (*)    All other components within normal limits  URINE CULTURE    EKG   Radiology No results found.  Procedures Procedures (including critical care time)  Medications Ordered in UC Medications - No data to display  Initial Impression / Assessment and Plan / UC Course  I have reviewed the triage vital signs and the nursing notes.  Pertinent labs & imaging results that were available during my care of the patient were reviewed by me and considered in my medical decision making (see chart for details).    UA consistent with that of an acute UTI treating with doxycycline 100 mg twice daily for 10 days.  Urine culture pending.Patient experienced hypoxia on arrival due to chronic obstructive pulmonary disease along with CHF.  Patient is oxygen dependent at home was unable to carry her tank into our clinic today therefore she was placed on 3 L of oxygen.  She is grossly stable at present at discharge she will resume her oxygen which is in her car.  She is without any acute distress and is ambulatory.  ER precautions given.   Final Clinical Impressions(s) / UC Diagnoses   Final diagnoses:  Acute UTI  Oxygen dependent  Chronic respiratory failure with hypoxia (HCC)  Chronic obstructive pulmonary disease, unspecified COPD type [facility])   Discharge Instructions   None    ED Prescriptions    Medication Sig Dispense Auth. Provider   doxycycline (VIBRAMYCIN)  100 MG capsule Take 1 capsule (100 mg total) by mouth 2 (two) times daily. 20 capsule Scot Jun, FNP     PDMP not reviewed this encounter.   Scot Jun, [place] 12/16/20 [phone]

## 2020-12-13 NOTE — ED Triage Notes (Signed)
Burning with urination since this past Saturday

## 2020-12-24 ENCOUNTER — Ambulatory Visit: Payer: Medicaid Other | Admitting: Emergency Medicine

## 2020-12-24 DIAGNOSIS — E782 Mixed hyperlipidemia: Secondary | ICD-10-CM | POA: Diagnosis not present

## 2020-12-24 DIAGNOSIS — J449 Chronic obstructive pulmonary disease, unspecified: Secondary | ICD-10-CM | POA: Diagnosis not present

## 2020-12-24 DIAGNOSIS — I2510 Atherosclerotic heart disease of native coronary artery without angina pectoris: Secondary | ICD-10-CM | POA: Diagnosis not present

## 2020-12-24 DIAGNOSIS — Z789 Other specified health status: Secondary | ICD-10-CM | POA: Diagnosis not present

## 2021-01-07 DIAGNOSIS — I10 Essential (primary) hypertension: Secondary | ICD-10-CM | POA: Diagnosis not present

## 2021-01-07 DIAGNOSIS — G459 Transient cerebral ischemic attack, unspecified: Secondary | ICD-10-CM | POA: Diagnosis not present

## 2021-01-12 DIAGNOSIS — M5136 Other intervertebral disc degeneration, lumbar region: Secondary | ICD-10-CM | POA: Diagnosis not present

## 2021-01-12 DIAGNOSIS — M5440 Lumbago with sciatica, unspecified side: Secondary | ICD-10-CM | POA: Diagnosis not present

## 2021-01-12 DIAGNOSIS — G4700 Insomnia, unspecified: Secondary | ICD-10-CM | POA: Diagnosis not present

## 2021-01-12 DIAGNOSIS — R030 Elevated blood-pressure reading, without diagnosis of hypertension: Secondary | ICD-10-CM | POA: Diagnosis not present

## 2021-01-12 DIAGNOSIS — Z79899 Other long term (current) drug therapy: Secondary | ICD-10-CM | POA: Diagnosis not present

## 2021-01-19 ENCOUNTER — Telehealth: Payer: Self-pay | Admitting: Emergency Medicine

## 2021-01-19 ENCOUNTER — Telehealth: Payer: Self-pay

## 2021-01-19 ENCOUNTER — Ambulatory Visit: Payer: Medicaid Other | Admitting: Emergency Medicine

## 2021-01-19 DIAGNOSIS — R0602 Shortness of breath: Secondary | ICD-10-CM

## 2021-01-19 NOTE — Telephone Encounter (Signed)
I see now that order was placed in Sept and scheduled but CT has been cancelled and rescheduled multiple times.  I will call and see if I can get it scheduled again.

## 2021-01-19 NOTE — Telephone Encounter (Signed)
Called and spoke with patient who is asking for her appointment today to be changed to a Televisit due to her having diarrhea for the last day or so and she lives in [place] and states she would not be able to make the drive to here. Patient states that she has been drinking water and has taken Imodium.  Dr. Lamonte Sakai please advise with any recommendations and if you are ok with switching to [facility]

## 2021-01-19 NOTE — Telephone Encounter (Signed)
Hey there is already a HRCT order that Kayla Ryan placed in Sept of last year and its not expired yet for this patient. Do you still need an order?

## 2021-01-19 NOTE — Telephone Encounter (Signed)
Will need order for HRCT to be placed.  Will route back to triage for order.

## 2021-01-19 NOTE — Telephone Encounter (Signed)
Spoke with pt and confirmed HRCT had not been completed and that qualifying walk for POC was required. Since HRCT has not been completed and walk needs to be completed in office pt was rescheduled. Pt stated understanding and will see Tammy on 01/28/21. Routing message to [facility] to see if they can assist with scheduling HRCT.

## 2021-01-20 NOTE — Telephone Encounter (Signed)
Error

## 2021-01-20 NOTE — Telephone Encounter (Signed)
I called [employer] cabin [employer] and spoke to [place].  She states order that was placed last Sept has expired and new order is needed.

## 2021-01-20 NOTE — Telephone Encounter (Signed)
CTHR has been ordered. Routing to [facility] to make aware.

## 2021-01-20 NOTE — Telephone Encounter (Signed)
I have scheduled pt's CT & left her a vm for her to call me back for appt info.  Nothing further needed for this message.

## 2021-01-24 DIAGNOSIS — J449 Chronic obstructive pulmonary disease, unspecified: Secondary | ICD-10-CM | POA: Diagnosis not present

## 2021-01-25 ENCOUNTER — Telehealth: Payer: Self-pay | Admitting: Adult Health

## 2021-01-25 NOTE — Telephone Encounter (Signed)
See other encounter.

## 2021-01-25 NOTE — Telephone Encounter (Signed)
Pt is returning phone call. Pls regard; [phone]

## 2021-01-25 NOTE — Telephone Encounter (Signed)
ATC Patient.  LM to call back. 

## 2021-01-25 NOTE — Telephone Encounter (Signed)
Called and spoke with Patient.  Patient is scheduled HRCT tomorrow and was concerned that it would not show as much as CT contrast.  Patient feels she has fluid and congestion in her lungs.  Advised Patient to keep HRCT appointment and explained difference between regular chest CT and HRCT.  Patient stated understanding.  Nothing further at this time.

## 2021-01-26 ENCOUNTER — Other Ambulatory Visit (HOSPITAL_COMMUNITY)
Admission: RE | Admit: 2021-01-26 | Discharge: 2021-01-26 | Disposition: A | Discharge status: Home / Self Care | Payer: Medicare Other | Source: Ambulatory Visit | Attending: Clinical Neurophysiology | Admitting: Clinical Neurophysiology

## 2021-01-26 ENCOUNTER — Ambulatory Visit (HOSPITAL_COMMUNITY)
Admission: RE | Admit: 2021-01-26 | Discharge: 2021-01-26 | Disposition: A | Discharge status: Home / Self Care | Payer: Medicare Other | Source: Ambulatory Visit | Attending: Emergency Medicine | Admitting: Emergency Medicine

## 2021-01-26 ENCOUNTER — Other Ambulatory Visit: Payer: Self-pay

## 2021-01-26 ENCOUNTER — Telehealth: Payer: Self-pay | Admitting: Emergency Medicine

## 2021-01-26 DIAGNOSIS — R0602 Shortness of breath: Secondary | ICD-10-CM | POA: Insufficient documentation

## 2021-01-26 DIAGNOSIS — Q320 Congenital tracheomalacia: Secondary | ICD-10-CM | POA: Diagnosis not present

## 2021-01-26 DIAGNOSIS — J432 Centrilobular emphysema: Secondary | ICD-10-CM | POA: Diagnosis not present

## 2021-01-26 NOTE — Telephone Encounter (Signed)
[facility] Nurse Case Manager; states that the pt is having trouble breathing while at rest and also has a wet cough. Just very concerned about the pt, pt does have an appt scheduled for 01/28/2021 but wanting someone to follow up with her. Pls regard; [phone] ext [phone] but prefers for [place] to call pt; [phone]

## 2021-01-26 NOTE — Telephone Encounter (Signed)
ATC Glenard Haring, [facility] Case Manager.  LM to call back if needed.  LM stating I spoke with Patient 01/25/21, discussed HRCT and Patient's issues.  Encouraged Patient to have HRCT scheduled 01/26/21 at 0930.   Patient has OV scheduled 01/28/21, with Tammy, NP.

## 2021-01-28 ENCOUNTER — Telehealth: Payer: Self-pay | Admitting: Adult Health

## 2021-01-28 ENCOUNTER — Encounter: Payer: Self-pay | Admitting: Adult Health

## 2021-01-28 ENCOUNTER — Other Ambulatory Visit (INDEPENDENT_AMBULATORY_CARE_PROVIDER_SITE_OTHER): Payer: Medicare Other

## 2021-01-28 ENCOUNTER — Other Ambulatory Visit: Payer: Self-pay

## 2021-01-28 ENCOUNTER — Ambulatory Visit (INDEPENDENT_AMBULATORY_CARE_PROVIDER_SITE_OTHER): Payer: Medicare Other | Admitting: Adult Health

## 2021-01-28 VITALS — BP 122/56 | HR 86 | Ht 69.0 in | Wt 181.8 lb

## 2021-01-28 DIAGNOSIS — D649 Anemia, unspecified: Secondary | ICD-10-CM

## 2021-01-28 DIAGNOSIS — N179 Acute kidney failure, unspecified: Secondary | ICD-10-CM

## 2021-01-28 DIAGNOSIS — R918 Other nonspecific abnormal finding of lung field: Secondary | ICD-10-CM | POA: Diagnosis not present

## 2021-01-28 DIAGNOSIS — J9611 Chronic respiratory failure with hypoxia: Secondary | ICD-10-CM | POA: Diagnosis not present

## 2021-01-28 DIAGNOSIS — R0602 Shortness of breath: Secondary | ICD-10-CM | POA: Diagnosis not present

## 2021-01-28 DIAGNOSIS — J449 Chronic obstructive pulmonary disease, unspecified: Secondary | ICD-10-CM | POA: Diagnosis not present

## 2021-01-28 LAB — BASIC METABOLIC PANEL
BUN: 12 mg/dL (ref 6–23)
CO2: 29 mEq/L (ref 19–32)
Calcium: 9.2 mg/dL (ref 8.4–10.5)
Chloride: 100 mEq/L (ref 96–112)
Creatinine, Ser: 1 mg/dL (ref 0.40–1.20)
GFR: 58.4 mL/min — ABNORMAL LOW (ref >60.00)
Glucose, Bld: 153 mg/dL — ABNORMAL HIGH (ref 70–99)
Potassium: 3.9 mEq/L (ref 3.5–5.1)
Sodium: 138 mEq/L (ref 135–145)

## 2021-01-28 LAB — CBC WITH DIFFERENTIAL/PLATELET
Basophils Absolute: 0.1 10*3/uL (ref 0.0–0.1)
Basophils Relative: 0.9 % (ref 0.0–3.0)
Eosinophils Absolute: 0.6 10*3/uL (ref 0.0–0.7)
Eosinophils Relative: 6.1 % — ABNORMAL HIGH (ref 0.0–5.0)
HCT: 22.9 % — CL (ref 36.0–46.0)
Hemoglobin: 7 g/dL — CL (ref 12.0–15.0)
Lymphocytes Relative: 28.4 % (ref 12.0–46.0)
Lymphs Abs: 2.6 10*3/uL (ref 0.7–4.0)
MCHC: 30.6 g/dL (ref 30.0–36.0)
MCV: 72.1 fl — ABNORMAL LOW (ref 78.0–100.0)
Monocytes Absolute: 0.7 10*3/uL (ref 0.1–1.0)
Monocytes Relative: 7.9 % (ref 3.0–12.0)
Neutro Abs: 5.1 10*3/uL (ref 1.4–7.7)
Neutrophils Relative %: 56.7 % (ref 43.0–77.0)
Platelets: 411 10*3/uL — ABNORMAL HIGH (ref 150.0–400.0)
RBC: 3.18 Mil/uL — ABNORMAL LOW (ref 3.87–5.11)
RDW: 18.1 % — ABNORMAL HIGH (ref 11.5–15.5)
WBC: 9 10*3/uL (ref 4.0–10.5)

## 2021-01-28 LAB — BRAIN NATRIURETIC PEPTIDE: Pro B Natriuretic peptide (BNP): 73 pg/mL (ref 0.0–100.0)

## 2021-01-28 NOTE — Telephone Encounter (Signed)
Lm for patient.  

## 2021-01-28 NOTE — Telephone Encounter (Signed)
Will discuss at ov 

## 2021-01-28 NOTE — Patient Instructions (Addendum)
Continue on Stiolto 2 puffs daily, rinse after use Albuterol or Duoneb As needed   Activity as tolerated.  Take Lasix as directed  Low salt diet .  Continue on Oxygen 2l/m .  Labs today  Follow up with PCP for anemia  Follow-up in the office in 3 months   with Dr. Lamonte Sakai  And As needed   Please contact office for sooner follow up if symptoms do not improve or worsen or seek emergency care

## 2021-01-28 NOTE — Assessment & Plan Note (Signed)
Severe COPD with emphysema.-Continue on maintenance regimen.  Patient has high symptom burden.  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   Plan  Patient Instructions  Continue on Stiolto 2 puffs daily, rinse after use Albuterol or Duoneb As needed   Activity as tolerated.  Take Lasix as directed  Low salt diet .  Continue on Oxygen 2l/m .  Labs today  Follow up with PCP for anemia  Follow-up in the office in 3 months   with Dr. Lamonte Sakai  And As needed   Please contact office for sooner follow up if symptoms do not improve or worsen or seek emergency care

## 2021-01-28 NOTE — Progress Notes (Signed)
@Patient  ID: Carolan Clines, female    DOB: 1956-07-10, 65 y.o.   MRN: [id number]  Chief Complaint  Patient presents with   Follow-up    Referring provider: Sandi Mariscal, MD  HPI: 65 year old female former smoker followed for COPD with asthma and emphysema Medical history significant for Crohn's disease, inflammatory myopathy /polymyositis and fibromyalgia Chronic kidney disease and anemia  TEST/EVENTS :  High resolution CT chest January 28, 2021-no enlarged mediastinal or hilar lymph nodes, small amount of bronchiectasis right middle lobe and lingula most compatible with areas of chronic postinfectious or inflammatory scarring, negative for interstitial lung disease.  Inspiratory and expiratory imaging remarkable for collapse of the trachea and mainstem bronchi during expiration indicative of tracheobronchialmalacia .  Decreased right upper lobe pulmonary nodule 6 mm previously 8 mm.  Consistent with a benign etiology.  Mild to moderate emphysema  2D echo December 2021 EF 70 to 75%, normal right ventricular systolic function.  RV size is normal  PFTs April 28, 2020 FEV1 44%, ratio 77, FVC 57%, DLCO 57%.  01/28/2021 Follow up : COPD , Emphysema ,  Patient returns for a 67-monthfollow-up.  Patient complains that she continues to have ongoing shortness of breath with low activity tolerance.  She has some intermittent cough.  No significant congestion.  No fever. Patient had a CT chest yesterday that showed no evidence of interstitial lung disease.  There was a small amount of bronchiectasis in the right middle lobe and lingula consistent with a postinfectious inflammatory scarring.  Right upper lobe pulmonary nodule decreased in size at 6 mm.  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  We reviewed her test results in detail. She remains on Stiolto 2 puffs daily.  She does feel that this really helps. Patient was admitted in January 2022, 65 years old.  With acute on chronic kidney disease and hypoxemia.  She  was started on oxygen at 2 L.  She says that she feels that oxygen has really helped her.  It makes her feel like she can do more activities while wearing.   Allergies  Allergen Reactions   Sulfa Antibiotics Shortness Of Breath, Swelling and Rash   Dilaudid [Hydromorphone Hcl]     Makes me crazy   Iron     Throat starts closing up, tongue swells, severe headaches and backpain   Metronidazole Diarrhea and Nausea And Vomiting   Prednisone Other (See Comments)    angry angry    Cephalexin Diarrhea and Nausea And Vomiting   Methotrexate Derivatives Swelling and Rash   Morphine And Related Anxiety    "exreme mood swings"    Immunization History  Administered Date(s) Administered   Fluad Quad(high Dose 65+) 05/27/2020   Influenza-Unspecified 06/17/2016   PFIZER(Purple Top)SARS-COV-2 Vaccination 10/02/2019, 11/20/2019   Tdap 10/31/2019    Past Medical History:  Diagnosis Date   Anemia    Arthritis    Asthma    Chest pain    Chronic bronchitis    Congestive heart failure (CHF) (HCC)    [place] disease[place]    Crohn disease (HCC)    Emphysema    Emphysema of lung (HCC)    Fibromyalgia    Herpes    Hyperlipidemia    IBS (irritable bowel syndrome)    Kidney failure    Migraines    Muscular dystrophy ([place]    Neck pain    Plantar fasciitis    [place]ymyositis ([facility]    Scoliosis     Tobacco History: Social History  Tobacco Use  Smoking Status Former   Packs/day: 1.00   Years: 49.00   Pack years: 49.00   Types: Cigarettes   Start date: 08/08/1966   Quit date: 08/31/2005   Years since quitting: 15.4  Smokeless Tobacco Never   Counseling given: Not Answered   Outpatient Medications Prior to Visit  Medication Sig Dispense Refill   albuterol (PROAIR HFA) 108 (90 Base) MCG/ACT inhaler Inhale 2 puffs into the lungs every 6 (six) hours as needed for wheezing or shortness of breath. 18 g 1   cetirizine (ZYRTEC) 10 MG tablet Take 10 mg by  mouth daily.     Coenzyme Q10 10 MG capsule Take 40 mg by mouth daily. 4 capsules po qd     diltiazem (CARDIZEM CD) 120 MG 24 hr capsule Take 1 capsule (120 mg total) by mouth daily. 30 capsule 2   doxepin (SINEQUAN) 10 MG capsule Take 10 mg by mouth at bedtime.     doxycycline (VIBRAMYCIN) 100 MG capsule Take 1 capsule (100 mg total) by mouth 2 (two) times daily. 20 capsule 0   DULoxetine (CYMBALTA) 60 MG capsule Take 1 capsule (60 mg total) by mouth 2 (two) times daily. 60 capsule 3   folic acid (FOLVITE) 1 MG tablet Take 1 tablet (1 mg total) by mouth daily. 30 tablet 3   gabapentin (NEURONTIN) 300 MG capsule Take 300 mg by mouth 3 (three) times daily.     gemfibrozil (LOPID) 600 MG tablet Take 600 mg by mouth 2 (two) times daily before a meal.     ipratropium-albuterol (DUONEB) 0.5-2.5 (3) MG/3ML SOLN Take 3 mLs by nebulization every 4 (four) hours as needed. 360 mL 1   isosorbide mononitrate (IMDUR) 30 MG 24 hr tablet Take 1 tablet (30 mg total) by mouth daily. 30 tablet 2   levothyroxine (SYNTHROID) 25 MCG tablet Take 1 tablet (25 mcg total) by mouth daily at 6 (six) AM. 30 tablet 3   LORazepam (ATIVAN) 0.5 MG tablet Take 1 tablet (0.5 mg total) by mouth 3 (three) times daily as needed for anxiety. 10 tablet 0   magnesium hydroxide (MILK OF MAGNESIA) 400 MG/5ML suspension Take 15 mLs by mouth at bedtime.     Melatonin 10 MG TBCR Take 60 mg by mouth at bedtime.     Multiple Vitamin (MULTIVITAMIN) capsule Take 1 capsule by mouth daily.     NITROSTAT 0.4 MG SL tablet Place 0.4 mg under the tongue every 15 (fifteen) minutes as needed for chest pain.      omeprazole (PRILOSEC) 40 MG capsule Take 1 capsule (40 mg total) by mouth daily. 30 capsule 1   oxyCODONE (OXY IR/ROXICODONE) 5 MG immediate release tablet Take 1 tablet (5 mg total) by mouth every 4 (four) hours as needed for moderate pain or severe pain. 10 tablet 0   Tiotropium Bromide-Olodaterol (STIOLTO RESPIMAT) 2.5-2.5 MCG/ACT AERS Inhale  2 puffs into the lungs daily. 4 g 5   topiramate (TOPAMAX) 100 MG tablet Take 100 mg by mouth daily.     valACYclovir (VALTREX) 500 MG tablet Take 500 mg by mouth 2 (two) times daily.     vitamin B-12 (CYANOCOBALAMIN) 1000 MCG tablet Take 1 tablet (1,000 mcg total) by mouth daily. 30 tablet 3   XTAMPZA ER 18 MG C12A Take 1 capsule by mouth 2 (two) times daily. 10 capsule 0   acetaminophen (TYLENOL) 325 MG tablet Take 2 tablets (650 mg total) by mouth every 4 (four) hours as needed for  headache or mild pain. (Patient not taking: Reported on 01/28/2021) 12 tablet 0   diclofenac sodium (VOLTAREN) 1 % GEL Apply 2 g topically 4 (four) times daily as needed (for pain). (Patient not taking: Reported on 01/28/2021) 5 Tube 1   furosemide (LASIX) 40 MG tablet Take 1 tablet (40 mg total) by mouth daily. (Patient not taking: Reported on 01/28/2021) 30 tablet 2   Vitamin D, Ergocalciferol, (DRISDOL) 50000 UNITS CAPS capsule Take 1 Units by mouth every 7 (seven) days. (Patient not taking: Reported on 01/28/2021)     No facility-administered medications prior to visit.     Review of Systems:   Constitutional:   No  weight loss, night sweats,  Fevers, chills,  +fatigue, or  lassitude.  HEENT:   No headaches,  Difficulty swallowing,  Tooth/dental problems, or  Sore throat,                No sneezing, itching, ear ache, nasal congestion, post nasal drip,   CV:  No chest pain,  Orthopnea, PND, swelling in lower extremities, anasarca, dizziness, palpitations, syncope.   GI  No heartburn, indigestion, abdominal pain, nausea, vomiting, diarrhea, change in bowel habits, loss of appetite, bloody stools.   Resp:  No chest wall deformity  Skin: no rash or lesions.  GU: no dysuria, change in color of urine, no urgency or frequency.  No flank pain, no hematuria   MS:  No joint pain or swelling.  No decreased range of motion.  No back pain.    Physical Exam  BP (!) 122/56   Pulse 86   Ht 5' 9"  (1.753 m)   Wt  181 lb 12.8 oz (82.5 kg)   SpO2 98%   BMI 26.85 kg/m   GEN: A/Ox3; pleasant , NAD, pale    HEENT:  [place]/AT,  EACs-clear, TMs-wnl, NOSE-clear, THROAT-clear, no lesions, no postnasal drip or exudate noted.   NECK:  Supple w/ fair ROM; no JVD; normal carotid impulses w/o bruits; no thyromegaly or nodules palpated; no lymphadenopathy.    RESP  Clear  P & A; w/o, wheezes/ rales/ or rhonchi. no accessory muscle use, no dullness to percussion  CARD:  RRR, no m/r/g, no peripheral edema, pulses intact, no cyanosis or clubbing.  GI:   Soft & nt; nml bowel sounds; no organomegaly or masses detected.   Musco: Warm bil, no deformities or joint swelling noted.   Neuro: alert, no focal deficits noted.    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 Electronically Signed   By: Vinnie Langton M.D.   On: 01/27/2021 08:17   Result Date: 01/27/2021 CLINICAL DATA:  65 year old female with history of shortness of breath. Dyspnea. History of COPD. EXAM: CT CHEST WITHOUT CONTRAST TECHNIQUE: Multidetector CT imaging of the chest was performed following the standard protocol without intravenous contrast. High resolution imaging of the lungs, as well as inspiratory and expiratory imaging, was performed. COMPARISON:  Chest CT 07/24/2020. FINDINGS: Cardiovascular: Heart size is normal. There is no significant pericardial fluid, thickening or pericardial calcification. Aortic atherosclerosis. No definite coronary artery calcifications. Mediastinum/Nodes: No pathologically enlarged mediastinal or hilar lymph nodes. Please note that accurate exclusion of hilar adenopathy is limited on noncontrast CT scans. Esophagus is unremarkable in  appearance. No axillary lymphadenopathy.  Lungs/Pleura: There is a small amount of cylindrical bronchiectasis and regional architectural distortion and volume loss in the medial segment of the right middle lobe and inferior segment of the lingula, most compatible with areas of chronic post infectious or inflammatory scarring. High-resolution images otherwise demonstrate no significant regions of ground-glass attenuation, septal thickening, subpleural reticulation, parenchymal banding, traction bronchiectasis or frank honeycombing to indicate interstitial lung disease. Inspiratory and expiratory imaging is remarkable for collapse of the trachea and mainstem bronchi during expiration indicative of tracheobronchomalacia. Previously noted right upper lobe pulmonary nodule (axial image 44 of series 5) currently measures only 6 x 4 mm (mean diameter of 5 mm), decreased from 8 mm on the prior study, presumably a benign post infectious or inflammatory nodule. No other larger more suspicious appearing pulmonary nodules or masses are noted. No acute consolidative airspace disease. No pleural effusions. Diffuse bronchial wall thickening with moderate centrilobular and mild paraseptal emphysema. Upper Abdomen: Aortic atherosclerosis. Musculoskeletal: There are no aggressive appearing lytic or blastic lesions noted in the visualized portions of the skeleton. IMPRESSION: 1. No definitive imaging findings to suggest interstitial lung disease. 2. Mild post infectious or inflammatory scarring in the medial segment of the right middle lobe and inferior segment of the lingula. 3. Diffuse bronchial wall thickening with moderate centrilobular and mild paraseptal emphysema; imaging findings suggestive of underlying COPD. 4. Previously demonstrated right upper lobe pulmonary nodule has decreased in size, considered benign. 5. Aortic atherosclerosis. Aortic Atherosclerosis (ICD10-I70.0) and Emphysema (ICD10-J43.9). Electronically Signed: By: Vinnie Langton M.D. On: 01/27/2021  07:36      PFT Results Latest Ref Rng & Units 04/28/2020  FVC-Pre L 2.15  FVC-Predicted Pre % 57  Pre FEV1/FVC % % 59  FEV1-Pre L 1.27  FEV1-Predicted Pre % 44  DLCO uncorrected ml/min/mmHg 13.31  DLCO UNC% % 57  DLCO corrected ml/min/mmHg 13.31  DLCO COR %Predicted % 57  DLVA Predicted % 77    No results found for: NITRICOXIDE      Assessment & Plan:   Pulmonary nodules Follow-up CT chest shows decrease in right upper lobe nodule consistent with a benign etiology no further imaging at this time is indicated  Dyspnea Dyspnea suspect is multifactorial with underlying COPD with emphysema, multiple comorbidities including chronic anemia Patient is continue on current regimen with Stiolto.  Activity as tolerated. Check CBC today. High-res CT chest negative for interstitial lung disease.  Plan  . Patient Instructions  Continue on Stiolto 2 puffs daily, rinse after use Albuterol or Duoneb As needed   Activity as tolerated.  Take Lasix as directed  Low salt diet .  Continue on Oxygen 2l/m .  Labs today  Follow up with PCP for anemia  Follow-up in the office in 3 months   with Dr. Lamonte Sakai  And As needed   Please contact office for sooner follow up if symptoms do not improve or worsen or seek emergency care       Chronic obstructive pulmonary disease/Emphysema Severe COPD with emphysema.-Continue on maintenance regimen.  Patient has high symptom burden.  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   Plan  Patient Instructions  Continue on Stiolto 2 puffs daily, rinse after use Albuterol or Duoneb As needed   Activity as tolerated.  Take Lasix as directed  Low salt diet .  Continue on Oxygen 2l/m .  Labs today  Follow up with PCP for anemia  Follow-up in the  office in 3 months   with Dr. Lamonte Sakai  And As needed   Please contact office for sooner follow up if symptoms do not improve or worsen or seek  emergency care       Chronic respiratory failure with hypoxia [facility]) Oxygen started January 2022.  Continue on O2 at 2 L.     Rexene Edison, NP 01/28/2021

## 2021-01-28 NOTE — Assessment & Plan Note (Signed)
Follow-up CT chest shows decrease in right upper lobe nodule consistent with a benign etiology no further imaging at this time is indicated

## 2021-01-28 NOTE — Telephone Encounter (Signed)
Noted  

## 2021-01-28 NOTE — Telephone Encounter (Signed)
Lab tech is calling for a lab report on the pt states that it is urgent. Pls regard; [phone].

## 2021-01-28 NOTE — Telephone Encounter (Signed)
ATC x1, no answer.  Left a vm to return call regarding lab work.  Will await return call.

## 2021-01-28 NOTE — Assessment & Plan Note (Signed)
Dyspnea suspect is multifactorial with underlying COPD with emphysema, multiple comorbidities including chronic anemia Patient is continue on current regimen with Stiolto.  Activity as tolerated. Check CBC today. High-res CT chest negative for interstitial lung disease.  Plan  . Patient Instructions  Continue on Stiolto 2 puffs daily, rinse after use Albuterol or Duoneb As needed   Activity as tolerated.  Take Lasix as directed  Low salt diet .  Continue on Oxygen 2l/m .  Labs today  Follow up with PCP for anemia  Follow-up in the office in 3 months   with Dr. Lamonte Sakai  And As needed   Please contact office for sooner follow up if symptoms do not improve or worsen or seek emergency care

## 2021-01-28 NOTE — Telephone Encounter (Signed)
Pt states that she is having a hard time breathing and believes that her lungs are filled with fluid bc she had an 8 lb overnight weight gain. Pls regard; [phone]; pt does have an appt today (01/28/2021) for a walk in regards to her O2 she stated that she really does bad and wanted to speak with a nurse. She stated that she is gonna keep her appt but still wanted to speak with someone.

## 2021-01-28 NOTE — Assessment & Plan Note (Signed)
Oxygen started January 2022.  Continue on O2 at 2 L.

## 2021-01-28 NOTE — Telephone Encounter (Signed)
Please call patient and let her know that her anemia is getting worse.  She has chronic anemia her hemoglobin has slowly went down from 7.9-7.0.  She has no known active bleeding.  This needs to be addressed with her primary care provider please have her reach out to see them within the next day or 2.   Please contact office for sooner follow up if symptoms do not improve or worsen or seek emergency care

## 2021-01-28 NOTE — Telephone Encounter (Signed)
Received call from [facility] from [facility], she states patient has a critical lab:  Hgb=7.0 and Hct=22.9.  Advised I would get labs to Rexene Edison NP.  Tammy, please advise regarding critical labs:  Hgb=7.0 and Hct=22.9.  Thank you.

## 2021-01-28 NOTE — Telephone Encounter (Signed)
Called and spoke with Patient.  Patient stated she was scheduled a walk at 1200 today for POC.  Advised Patient she would be seen in office by Lynelle Smoke, NP.  Understanding stated.  Patient stated her Mio, Glenard Haring had seen her this week and told her she had a 9 lbs weight gain.  Patient stated she had not been taking her fluid pill, because she doesn't like the way it makes her feel.  I asked if she had any edema in lower extremities and she stated when checked by Glenard Haring she did.  I asked if Patient had been taking fluid pill since being seen by [facility], and Patient yes, and she lost 4 lbs.  Patient denies any edema at this time. Advised Patient importance of taking her fluid pill and reasons. Patient stated she feels like she has some congestion in her chest.  Patient stated she has a wet sounding cough at  times, but denies producing any sputum. Patient is wanting HRCT results from this week.  Micheline Maze, NP may review results with her. Patient stated she went to eat with family last evening and her portable tanks needed regulator attached and Patient did not know how to do it.  Patient took her tanks to her local fire department and they fixed her tanks. Advised Patient to keep her scheduled OV with Tammy, NP.  Understanding stated.   Routing message to Ada, NP as Juluis Rainier

## 2021-01-29 ENCOUNTER — Telehealth: Payer: Self-pay | Admitting: Adult Health

## 2021-01-29 MED ORDER — DOXYCYCLINE HYCLATE 100 MG PO TABS: 100.0000 mg | ORAL_TABLET | Freq: Two times a day (BID) | ORAL | 0 refills | Status: DC

## 2021-01-29 NOTE — Telephone Encounter (Signed)
Called and spoke with Patient. DR. Hunsucker's recommendations given.  Understanding stated.  Doxycycline prescription sent to requested pharmacy. Nothing further at this time.

## 2021-01-29 NOTE — Telephone Encounter (Signed)
Called patient but she did not answer. Left message for patient to call back.  

## 2021-01-29 NOTE — Telephone Encounter (Signed)
Called and spoke with Patient.  Tammy, NP recommendations given.  Understanding stated. Patient stated she would contact PCP this morning. Patient did want Tammy, NP to know that this morning she is coughing thick, green, sometimes white sputum.  Patient is requesting a antibiotic to be sent to [facility].   Message routed to Dr. Silas Flood (DOD) to advise

## 2021-01-29 NOTE — Telephone Encounter (Signed)
COPD, prednisone listed as allergy. Symptoms concerning for exacerbation. Possible could improve with time. Please provide doxycycline 100 mg PO BID x 7 days (14 total). If worsens or not improving by Monday advise an acute visit vs presentation to urgent care/ED.

## 2021-02-02 ENCOUNTER — Telehealth: Payer: Self-pay | Admitting: Pulmonary Disease

## 2021-02-02 NOTE — Telephone Encounter (Addendum)
Called and spoke to pt. Pt states her cough has improved since abx was sent in on 6/24 (see phone note from 6/23). Pt needing a 3 month ROV per TP. Pt has been scheduled with Dr. Lamonte Sakai for 04/2021. Pt verbalized understanding and denied any further questions or concerns at this time.

## 2021-02-02 NOTE — Progress Notes (Signed)
I returned a call to Kayla Ryan after receiving an answering service message. Per Kayla Ryan she's been on oral Doxycycline for the past 3 days but has now developed a fever of > 101, shortness of breath, sinus congestion, chills, generalized aches and pains, and feeling faint. She states that her baseline hemoglobin a few days ago was 7 gm / dl. She has not had a recent Covid test. She is vaccinated but not boosted. She has a history of severe COPD on home oxygen but does not have a pulse oximeter at home to monitor her oxygen saturation. Patient was asked to go to Urgent care for evaluation.

## 2021-02-04 DIAGNOSIS — G4700 Insomnia, unspecified: Secondary | ICD-10-CM | POA: Diagnosis not present

## 2021-02-04 DIAGNOSIS — M5440 Lumbago with sciatica, unspecified side: Secondary | ICD-10-CM | POA: Diagnosis not present

## 2021-02-04 DIAGNOSIS — M5136 Other intervertebral disc degeneration, lumbar region: Secondary | ICD-10-CM | POA: Diagnosis not present

## 2021-02-09 DIAGNOSIS — Z789 Other specified health status: Secondary | ICD-10-CM | POA: Diagnosis not present

## 2021-02-09 DIAGNOSIS — I2510 Atherosclerotic heart disease of native coronary artery without angina pectoris: Secondary | ICD-10-CM | POA: Diagnosis not present

## 2021-02-09 DIAGNOSIS — J449 Chronic obstructive pulmonary disease, unspecified: Secondary | ICD-10-CM | POA: Diagnosis not present

## 2021-02-23 DIAGNOSIS — J449 Chronic obstructive pulmonary disease, unspecified: Secondary | ICD-10-CM | POA: Diagnosis not present

## 2021-03-15 DIAGNOSIS — Z79899 Other long term (current) drug therapy: Secondary | ICD-10-CM | POA: Diagnosis not present

## 2021-03-15 DIAGNOSIS — M5136 Other intervertebral disc degeneration, lumbar region: Secondary | ICD-10-CM | POA: Diagnosis not present

## 2021-03-15 DIAGNOSIS — M5440 Lumbago with sciatica, unspecified side: Secondary | ICD-10-CM | POA: Diagnosis not present

## 2021-03-15 DIAGNOSIS — G629 Polyneuropathy, unspecified: Secondary | ICD-10-CM | POA: Diagnosis not present

## 2021-03-26 DIAGNOSIS — J449 Chronic obstructive pulmonary disease, unspecified: Secondary | ICD-10-CM | POA: Diagnosis not present

## 2021-03-31 DIAGNOSIS — Z79899 Other long term (current) drug therapy: Secondary | ICD-10-CM | POA: Diagnosis not present

## 2021-03-31 DIAGNOSIS — J449 Chronic obstructive pulmonary disease, unspecified: Secondary | ICD-10-CM | POA: Diagnosis not present

## 2021-03-31 DIAGNOSIS — Z789 Other specified health status: Secondary | ICD-10-CM | POA: Diagnosis not present

## 2021-04-13 DIAGNOSIS — Z780 Asymptomatic menopausal state: Secondary | ICD-10-CM | POA: Diagnosis not present

## 2021-04-13 DIAGNOSIS — M5440 Lumbago with sciatica, unspecified side: Secondary | ICD-10-CM | POA: Diagnosis not present

## 2021-04-13 DIAGNOSIS — J370 Chronic laryngitis: Secondary | ICD-10-CM | POA: Diagnosis not present

## 2021-04-13 DIAGNOSIS — Z79899 Other long term (current) drug therapy: Secondary | ICD-10-CM | POA: Diagnosis not present

## 2021-04-13 DIAGNOSIS — M5136 Other intervertebral disc degeneration, lumbar region: Secondary | ICD-10-CM | POA: Diagnosis not present

## 2021-04-20 DIAGNOSIS — I10 Essential (primary) hypertension: Secondary | ICD-10-CM | POA: Diagnosis not present

## 2021-04-20 DIAGNOSIS — Z789 Other specified health status: Secondary | ICD-10-CM | POA: Diagnosis not present

## 2021-04-20 DIAGNOSIS — M129 Arthropathy, unspecified: Secondary | ICD-10-CM | POA: Diagnosis not present

## 2021-04-21 ENCOUNTER — Telehealth: Payer: Self-pay | Admitting: Emergency Medicine

## 2021-04-21 NOTE — Telephone Encounter (Signed)
Called patient but she did not answer. Left message for her to call back.  

## 2021-04-26 DIAGNOSIS — J449 Chronic obstructive pulmonary disease, unspecified: Secondary | ICD-10-CM | POA: Diagnosis not present

## 2021-04-29 ENCOUNTER — Inpatient Hospital Stay (HOSPITAL_COMMUNITY)
Admission: EM | Admit: 2021-04-29 | Discharge: 2021-05-03 | DRG: 811 | Disposition: A | Discharge status: Home / Self Care | Payer: Medicare Other | Attending: Internal Medicine | Admitting: Internal Medicine

## 2021-04-29 ENCOUNTER — Encounter (HOSPITAL_COMMUNITY): Payer: Self-pay | Admitting: Emergency Medicine

## 2021-04-29 ENCOUNTER — Observation Stay (HOSPITAL_COMMUNITY): Payer: Medicare Other

## 2021-04-29 ENCOUNTER — Other Ambulatory Visit: Payer: Self-pay

## 2021-04-29 ENCOUNTER — Emergency Department (HOSPITAL_COMMUNITY): Payer: Medicare Other

## 2021-04-29 DIAGNOSIS — D649 Anemia, unspecified: Secondary | ICD-10-CM

## 2021-04-29 DIAGNOSIS — G894 Chronic pain syndrome: Secondary | ICD-10-CM | POA: Diagnosis present

## 2021-04-29 DIAGNOSIS — Z743 Need for continuous supervision: Secondary | ICD-10-CM | POA: Diagnosis not present

## 2021-04-29 DIAGNOSIS — R1310 Dysphagia, unspecified: Secondary | ICD-10-CM | POA: Diagnosis not present

## 2021-04-29 DIAGNOSIS — J9611 Chronic respiratory failure with hypoxia: Secondary | ICD-10-CM | POA: Diagnosis not present

## 2021-04-29 DIAGNOSIS — D509 Iron deficiency anemia, unspecified: Principal | ICD-10-CM | POA: Diagnosis present

## 2021-04-29 DIAGNOSIS — R404 Transient alteration of awareness: Secondary | ICD-10-CM | POA: Diagnosis not present

## 2021-04-29 DIAGNOSIS — M3390 Dermatopolymyositis, unspecified, organ involvement unspecified: Secondary | ICD-10-CM | POA: Diagnosis present

## 2021-04-29 DIAGNOSIS — M419 Scoliosis, unspecified: Secondary | ICD-10-CM | POA: Diagnosis not present

## 2021-04-29 DIAGNOSIS — M3320 Polymyositis, organ involvement unspecified: Secondary | ICD-10-CM | POA: Diagnosis not present

## 2021-04-29 DIAGNOSIS — K219 Gastro-esophageal reflux disease without esophagitis: Secondary | ICD-10-CM | POA: Diagnosis not present

## 2021-04-29 DIAGNOSIS — R6889 Other general symptoms and signs: Secondary | ICD-10-CM | POA: Diagnosis not present

## 2021-04-29 DIAGNOSIS — J439 Emphysema, unspecified: Secondary | ICD-10-CM | POA: Diagnosis not present

## 2021-04-29 DIAGNOSIS — Z825 Family history of asthma and other chronic lower respiratory diseases: Secondary | ICD-10-CM | POA: Diagnosis not present

## 2021-04-29 DIAGNOSIS — I110 Hypertensive heart disease with heart failure: Secondary | ICD-10-CM | POA: Diagnosis not present

## 2021-04-29 DIAGNOSIS — M1990 Unspecified osteoarthritis, unspecified site: Secondary | ICD-10-CM | POA: Diagnosis present

## 2021-04-29 DIAGNOSIS — M797 Fibromyalgia: Secondary | ICD-10-CM | POA: Diagnosis present

## 2021-04-29 DIAGNOSIS — Z801 Family history of malignant neoplasm of trachea, bronchus and lung: Secondary | ICD-10-CM

## 2021-04-29 DIAGNOSIS — I4891 Unspecified atrial fibrillation: Secondary | ICD-10-CM | POA: Diagnosis not present

## 2021-04-29 DIAGNOSIS — J189 Pneumonia, unspecified organism: Secondary | ICD-10-CM | POA: Diagnosis present

## 2021-04-29 DIAGNOSIS — G7100 Muscular dystrophy, unspecified: Secondary | ICD-10-CM | POA: Diagnosis present

## 2021-04-29 DIAGNOSIS — Z79890 Hormone replacement therapy (postmenopausal): Secondary | ICD-10-CM

## 2021-04-29 DIAGNOSIS — M3313 Other dermatomyositis without myopathy: Secondary | ICD-10-CM | POA: Diagnosis present

## 2021-04-29 DIAGNOSIS — I10 Essential (primary) hypertension: Secondary | ICD-10-CM | POA: Diagnosis present

## 2021-04-29 DIAGNOSIS — Z818 Family history of other mental and behavioral disorders: Secondary | ICD-10-CM

## 2021-04-29 DIAGNOSIS — Z9981 Dependence on supplemental oxygen: Secondary | ICD-10-CM

## 2021-04-29 DIAGNOSIS — K5730 Diverticulosis of large intestine without perforation or abscess without bleeding: Secondary | ICD-10-CM | POA: Diagnosis present

## 2021-04-29 DIAGNOSIS — Z882 Allergy status to sulfonamides status: Secondary | ICD-10-CM

## 2021-04-29 DIAGNOSIS — Z807 Family history of other malignant neoplasms of lymphoid, hematopoietic and related tissues: Secondary | ICD-10-CM | POA: Diagnosis not present

## 2021-04-29 DIAGNOSIS — I480 Paroxysmal atrial fibrillation: Secondary | ICD-10-CM | POA: Diagnosis not present

## 2021-04-29 DIAGNOSIS — E785 Hyperlipidemia, unspecified: Secondary | ICD-10-CM | POA: Diagnosis not present

## 2021-04-29 DIAGNOSIS — R079 Chest pain, unspecified: Secondary | ICD-10-CM | POA: Diagnosis not present

## 2021-04-29 DIAGNOSIS — R0602 Shortness of breath: Secondary | ICD-10-CM | POA: Diagnosis not present

## 2021-04-29 DIAGNOSIS — R059 Cough, unspecified: Secondary | ICD-10-CM | POA: Diagnosis not present

## 2021-04-29 DIAGNOSIS — J449 Chronic obstructive pulmonary disease, unspecified: Secondary | ICD-10-CM | POA: Diagnosis not present

## 2021-04-29 DIAGNOSIS — Z79899 Other long term (current) drug therapy: Secondary | ICD-10-CM

## 2021-04-29 DIAGNOSIS — K5090 Crohn's disease, unspecified, without complications: Secondary | ICD-10-CM | POA: Diagnosis not present

## 2021-04-29 DIAGNOSIS — R4020 Unspecified coma: Secondary | ICD-10-CM | POA: Diagnosis not present

## 2021-04-29 DIAGNOSIS — Z87891 Personal history of nicotine dependence: Secondary | ICD-10-CM

## 2021-04-29 DIAGNOSIS — G629 Polyneuropathy, unspecified: Secondary | ICD-10-CM | POA: Diagnosis not present

## 2021-04-29 DIAGNOSIS — R0600 Dyspnea, unspecified: Secondary | ICD-10-CM | POA: Diagnosis not present

## 2021-04-29 DIAGNOSIS — I5032 Chronic diastolic (congestive) heart failure: Secondary | ICD-10-CM | POA: Diagnosis present

## 2021-04-29 DIAGNOSIS — K529 Noninfective gastroenteritis and colitis, unspecified: Secondary | ICD-10-CM | POA: Diagnosis present

## 2021-04-29 DIAGNOSIS — Z20822 Contact with and (suspected) exposure to covid-19: Secondary | ICD-10-CM | POA: Diagnosis not present

## 2021-04-29 DIAGNOSIS — Z885 Allergy status to narcotic agent status: Secondary | ICD-10-CM

## 2021-04-29 DIAGNOSIS — Z888 Allergy status to other drugs, medicaments and biological substances status: Secondary | ICD-10-CM

## 2021-04-29 LAB — BASIC METABOLIC PANEL
Anion gap: 9 (ref 5–15)
BUN: 23 mg/dL (ref 8–23)
CO2: 26 mmol/L (ref 22–32)
Calcium: 8.3 mg/dL — ABNORMAL LOW (ref 8.9–10.3)
Chloride: 100 mmol/L (ref 98–111)
Creatinine, Ser: 1.06 mg/dL — ABNORMAL HIGH (ref 0.44–1.00)
GFR, Estimated: 58 mL/min — ABNORMAL LOW (ref >60)
Glucose, Bld: 161 mg/dL — ABNORMAL HIGH (ref 70–99)
Potassium: 4 mmol/L (ref 3.5–5.1)
Sodium: 135 mmol/L (ref 135–145)

## 2021-04-29 LAB — CBC WITH DIFFERENTIAL/PLATELET
Abs Immature Granulocytes: 0.31 10*3/uL — ABNORMAL HIGH (ref 0.00–0.07)
Basophils Absolute: 0.1 10*3/uL (ref 0.0–0.1)
Basophils Relative: 0 %
Eosinophils Absolute: 0 10*3/uL (ref 0.0–0.5)
Eosinophils Relative: 0 %
HCT: 19.4 % — ABNORMAL LOW (ref 36.0–46.0)
Hemoglobin: 5.3 g/dL — CL (ref 12.0–15.0)
Immature Granulocytes: 1 %
Lymphocytes Relative: 8 %
Lymphs Abs: 1.7 10*3/uL (ref 0.7–4.0)
MCH: 20.7 pg — ABNORMAL LOW (ref 26.0–34.0)
MCHC: 27.3 g/dL — ABNORMAL LOW (ref 30.0–36.0)
MCV: 75.8 fL — ABNORMAL LOW (ref 80.0–100.0)
Monocytes Absolute: 1.4 10*3/uL — ABNORMAL HIGH (ref 0.1–1.0)
Monocytes Relative: 7 %
Neutro Abs: 18.2 10*3/uL — ABNORMAL HIGH (ref 1.7–7.7)
Neutrophils Relative %: 84 %
Platelets: 435 10*3/uL — ABNORMAL HIGH (ref 150–400)
RBC: 2.56 MIL/uL — ABNORMAL LOW (ref 3.87–5.11)
RDW: 18.3 % — ABNORMAL HIGH (ref 11.5–15.5)
WBC: 21.7 10*3/uL — ABNORMAL HIGH (ref 4.0–10.5)
nRBC: 0.2 % (ref 0.0–0.2)

## 2021-04-29 LAB — IRON AND TIBC
Iron: 8 ug/dL — ABNORMAL LOW (ref 28–170)
Saturation Ratios: 2 % — ABNORMAL LOW (ref 10.4–31.8)
TIBC: 532 ug/dL — ABNORMAL HIGH (ref 250–450)
UIBC: 524 ug/dL

## 2021-04-29 LAB — FERRITIN: Ferritin: 11 ng/mL (ref 11–307)

## 2021-04-29 LAB — VITAMIN B12: Vitamin B-12: 490 pg/mL (ref 180–914)

## 2021-04-29 LAB — RESP PANEL BY RT-PCR (FLU A&B, COVID) ARPGX2
Influenza A by PCR: NEGATIVE
Influenza B by PCR: NEGATIVE
SARS Coronavirus 2 by RT PCR: NEGATIVE

## 2021-04-29 LAB — RETICULOCYTES
Immature Retic Fract: 39.5 % — ABNORMAL HIGH (ref 2.3–15.9)
RBC.: 2.6 MIL/uL — ABNORMAL LOW (ref 3.87–5.11)
Retic Count, Absolute: 100.6 10*3/uL (ref 19.0–186.0)
Retic Ct Pct: 3.9 % — ABNORMAL HIGH (ref 0.4–3.1)

## 2021-04-29 LAB — PREPARE RBC (CROSSMATCH)

## 2021-04-29 LAB — TROPONIN I (HIGH SENSITIVITY): Troponin I (High Sensitivity): 49 ng/L — ABNORMAL HIGH (ref <18)

## 2021-04-29 LAB — BRAIN NATRIURETIC PEPTIDE: B Natriuretic Peptide: 448 pg/mL — ABNORMAL HIGH (ref 0.0–100.0)

## 2021-04-29 LAB — FOLATE: Folate: 8.2 ng/mL (ref >5.9)

## 2021-04-29 MED ORDER — POLYETHYLENE GLYCOL 3350 17 G PO PACK: 17.0000 g | PACK | Freq: Every day | ORAL | Status: DC | PRN

## 2021-04-29 MED ORDER — TOPIRAMATE 25 MG PO TABS
25.0000 mg | ORAL_TABLET | Freq: Every day | ORAL | Status: DC
  Administered 2021-05-01 – 2021-05-02 (×2): 25 mg via ORAL
  Filled 2021-04-29 (×4): qty 1

## 2021-04-29 MED ORDER — GEMFIBROZIL 600 MG PO TABS
600.0000 mg | ORAL_TABLET | Freq: Two times a day (BID) | ORAL | Status: DC
  Administered 2021-04-30 – 2021-05-03 (×7): 600 mg via ORAL
  Filled 2021-04-29 (×7): qty 1

## 2021-04-29 MED ORDER — ARFORMOTEROL TARTRATE 15 MCG/2ML IN NEBU
15.0000 ug | INHALATION_SOLUTION | Freq: Two times a day (BID) | RESPIRATORY_TRACT | Status: DC
Start: 2021-04-29 — End: 2021-05-03
  Administered 2021-04-29 – 2021-05-03 (×8): 15 ug via RESPIRATORY_TRACT
  Filled 2021-04-29 (×8): qty 2

## 2021-04-29 MED ORDER — GUAIFENESIN-DM 100-10 MG/5ML PO SYRP
5.0000 mL | ORAL_SOLUTION | ORAL | Status: DC | PRN
  Administered 2021-04-30 – 2021-05-01 (×3): 5 mL via ORAL
  Filled 2021-04-29 (×3): qty 5

## 2021-04-29 MED ORDER — SODIUM CHLORIDE 0.9 % IV SOLN
10.0000 mL/h | Freq: Once | INTRAVENOUS | Status: AC
  Administered 2021-04-29: 10 mL/h via INTRAVENOUS

## 2021-04-29 MED ORDER — MONTELUKAST SODIUM 10 MG PO TABS
10.0000 mg | ORAL_TABLET | Freq: Every day | ORAL | Status: DC
  Administered 2021-04-30 – 2021-05-03 (×5): 10 mg via ORAL
  Filled 2021-04-29 (×5): qty 1

## 2021-04-29 MED ORDER — IPRATROPIUM-ALBUTEROL 0.5-2.5 (3) MG/3ML IN SOLN
3.0000 mL | RESPIRATORY_TRACT | Status: DC | PRN
  Filled 2021-04-29: qty 3

## 2021-04-29 MED ORDER — UMECLIDINIUM BROMIDE 62.5 MCG/INH IN AEPB
1.0000 | INHALATION_SPRAY | Freq: Every day | RESPIRATORY_TRACT | Status: DC
  Administered 2021-04-29 – 2021-05-01 (×3): 1 via RESPIRATORY_TRACT
  Filled 2021-04-29: qty 7

## 2021-04-29 MED ORDER — LORATADINE 10 MG PO TABS
10.0000 mg | ORAL_TABLET | Freq: Every day | ORAL | Status: DC
  Administered 2021-04-30 – 2021-05-03 (×4): 10 mg via ORAL
  Filled 2021-04-29 (×4): qty 1

## 2021-04-29 MED ORDER — GABAPENTIN 300 MG PO CAPS
300.0000 mg | ORAL_CAPSULE | Freq: Three times a day (TID) | ORAL | Status: DC
  Administered 2021-04-30 – 2021-05-03 (×11): 300 mg via ORAL
  Filled 2021-04-29 (×11): qty 1

## 2021-04-29 MED ORDER — OXYCODONE HCL ER 15 MG PO T12A
15.0000 mg | EXTENDED_RELEASE_TABLET | Freq: Two times a day (BID) | ORAL | Status: DC
  Administered 2021-04-29 – 2021-05-03 (×8): 15 mg via ORAL
  Filled 2021-04-29 (×7): qty 1

## 2021-04-29 MED ORDER — LORAZEPAM 0.5 MG PO TABS
0.5000 mg | ORAL_TABLET | Freq: Three times a day (TID) | ORAL | Status: DC | PRN
  Administered 2021-04-29 – 2021-05-02 (×6): 0.5 mg via ORAL
  Filled 2021-04-29 (×6): qty 1

## 2021-04-29 MED ORDER — SUVOREXANT 20 MG PO TABS: 1.0000 | ORAL_TABLET | Freq: Every evening | ORAL | Status: DC | PRN

## 2021-04-29 MED ORDER — IOHEXOL 350 MG/ML SOLN
80.0000 mL | Freq: Once | INTRAVENOUS | Status: AC | PRN
  Administered 2021-04-30: 80 mL via INTRAVENOUS

## 2021-04-29 MED ORDER — ONDANSETRON HCL 4 MG PO TABS: 4.0000 mg | ORAL_TABLET | Freq: Four times a day (QID) | ORAL | Status: DC | PRN

## 2021-04-29 MED ORDER — ACETAMINOPHEN 650 MG RE SUPP: 650.0000 mg | Freq: Four times a day (QID) | RECTAL | Status: DC | PRN

## 2021-04-29 MED ORDER — ACETAMINOPHEN 325 MG PO TABS
650.0000 mg | ORAL_TABLET | Freq: Four times a day (QID) | ORAL | Status: DC | PRN
  Administered 2021-04-29 – 2021-05-02 (×4): 650 mg via ORAL
  Filled 2021-04-29 (×4): qty 2

## 2021-04-29 MED ORDER — DULOXETINE HCL 60 MG PO CPEP
60.0000 mg | ORAL_CAPSULE | Freq: Two times a day (BID) | ORAL | Status: DC
  Administered 2021-04-30 – 2021-05-03 (×8): 60 mg via ORAL
  Filled 2021-04-29 (×8): qty 1

## 2021-04-29 MED ORDER — PANTOPRAZOLE SODIUM 40 MG IV SOLR
40.0000 mg | INTRAVENOUS | Status: DC
  Administered 2021-04-30: 40 mg via INTRAVENOUS
  Filled 2021-04-29: qty 40

## 2021-04-29 MED ORDER — VALACYCLOVIR HCL 500 MG PO TABS
500.0000 mg | ORAL_TABLET | Freq: Two times a day (BID) | ORAL | Status: DC
  Administered 2021-04-30 – 2021-05-03 (×8): 500 mg via ORAL
  Filled 2021-04-29 (×12): qty 1

## 2021-04-29 MED ORDER — LEVOFLOXACIN IN D5W 750 MG/150ML IV SOLN
750.0000 mg | Freq: Once | INTRAVENOUS | Status: AC
  Administered 2021-04-29: 750 mg via INTRAVENOUS
  Filled 2021-04-29: qty 150

## 2021-04-29 MED ORDER — ONDANSETRON HCL 4 MG/2ML IJ SOLN
4.0000 mg | Freq: Four times a day (QID) | INTRAMUSCULAR | Status: DC | PRN
  Administered 2021-04-30: 4 mg via INTRAVENOUS
  Filled 2021-04-29: qty 2

## 2021-04-29 NOTE — ED Notes (Signed)
Witnessed occult blood card

## 2021-04-29 NOTE — Telephone Encounter (Signed)
Called and spoke to pt. Pt states she is at [facility]. Pt states she has been feeling worse with prod cough with brown thick mucus. Pt states has had 3 syncopal episodes (with LOC) over the course of 4 days. Pt states she was given a zpak by her PCP which has not helped. Informed pt that it was good decision for ED visit due to her symptoms. Advised pt to call our office after she gets d/c. Pt verbalized understanding.   Will forward to Dr. Lamonte Sakai as Juluis Rainier.

## 2021-04-29 NOTE — ED Triage Notes (Signed)
Pt arrived via [facility]. Pt c/o of SOBx 5 weeks w/ chest pain x1week. Pt has productive cough. Pt has had multiple falls she states is due to losing consciousness. Pt wears 3L of O2 chronically d/t COPD

## 2021-04-29 NOTE — ED Notes (Signed)
Date and time results received: 04/29/21 n (use smartphrase ".now" to insert current time)  Test: HGb  Critical Value: 5.3  Name of Provider Notified: Kenton Kingfisher  Orders Received? Or Actions Taken?:  na

## 2021-04-29 NOTE — H&P (Signed)
History and Physical    Kayla Ryan:[id number] DOB: 1955-10-24 DOA: 04/29/2021  PCP: Guadlupe Spanish, MD   Patient coming from: Home  I have personally briefly reviewed patient's old medical records in [place]  Chief Complaint: Difficulty breathing  HPI: Kayla Ryan is a 65 y.o. 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 Patient presented to the ED with complaints of difficulty breathing ongoing for several years, but significantly worse in the past 5 weeks.  She reports intermittent left lower chest pains of 1 week duration not related to activity.  She reports a cough productive of greenish-brown sputum for which she was prescribed a course of azithromycin and she completed 5 days of treatment on the 18th. She reports falls, weakness.  No black stools no bloody stools she reports one fourth episode of vomiting a week equal, no blood.  She denies NSAID use, she takes opiates for pain control.  No abdominal pain.  She reports workup in the past for her anemia that did not include complete endoscopy, but etiology of her anemia was not identified.  ED Course: T-max 99.7.  Hemoglobin low 5.3.  Significant leukocytosis of 21.7.  BNP over 448.  Portable chest x-ray bibasilar reticular infiltrate, possibly representing trace pulmonary edema.  2 units of blood ordered for transfusion.  Levaquin started for pneumonia.  Hospitalist to admit.  Review of Systems: As per HPI all other systems reviewed and negative.  Past Medical History:  Diagnosis Date   Anemia    Arthritis    Asthma    Chest pain    Chronic bronchitis    Congestive heart failure (CHF) (HCC)    COPD (chronic obstructive pulmonary disease) (HCC)    Crohn disease (HCC)    Emphysema    Emphysema of lung (HCC)    Fibromyalgia    Herpes    Hyperlipidemia     IBS (irritable bowel syndrome)    Kidney failure    Migraines    Muscular dystrophy ([place])    Neck pain    Plantar fasciitis    Polymyositis ([place])    Scoliosis     Past Surgical History:  Procedure Laterality Date   ABDOMINAL HYSTERECTOMY     bone spur     CHOLECYSTECTOMY     HERNIA REPAIR     LEFT HEART CATHETERIZATION WITH CORONARY ANGIOGRAM N/A 11/07/2013   Procedure: LEFT HEART CATHETERIZATION WITH CORONARY ANGIOGRAM;  Surgeon: Lorretta Harp, MD;  Location: [facility] CATH LAB;  Service: Cardiovascular;  Laterality: N/A;   MASTECTOMY PARTIAL / LUMPECTOMY     OOPHORECTOMY     ROTATOR CUFF REPAIR       reports that she quit smoking about 15 years ago. Her smoking use included cigarettes. She started smoking about 54 years ago. She has a 49.00 pack-year smoking history. She has never used smokeless tobacco. She reports that she does not drink alcohol and does not use drugs.  Allergies  Allergen Reactions   Sulfa Antibiotics Shortness Of Breath, Swelling and Rash   Dilaudid [Hydromorphone Hcl]     Makes me crazy   Iron     Throat starts closing up, tongue swells, severe headaches and backpain   Metronidazole Diarrhea and Nausea And Vomiting   Prednisone Other (See Comments)    angry angry    Cephalexin Diarrhea and Nausea And Vomiting   Methotrexate Derivatives  Swelling and Rash   Morphine And Related Anxiety    "exreme mood swings"    Family History  Problem Relation Age of Onset   Lung cancer Mother    COPD Father    Lung cancer Father    Lymphoma Father    Anxiety disorder Sister    Depression Brother     Prior to Admission medications   Medication Sig Start Date End Date Taking? Authorizing Provider  acetaminophen (TYLENOL) 325 MG tablet Take 2 tablets (650 mg total) by mouth every 4 (four) hours as needed for headache or mild pain. Patient not taking: Reported on 01/28/2021 07/26/20   Roxan Hockey, MD  albuterol (PROAIR HFA) 108 (90 Base) MCG/ACT inhaler  Inhale 2 puffs into the lungs every 6 (six) hours as needed for wheezing or shortness of breath. 07/26/20   Roxan Hockey, MD  cetirizine (ZYRTEC) 10 MG tablet Take 10 mg by mouth daily.    [provider]  Coenzyme Q10 10 MG capsule Take 40 mg by mouth daily. 4 capsules po qd    [provider]  diclofenac sodium (VOLTAREN) 1 % GEL Apply 2 g topically 4 (four) times daily as needed (for pain). Patient not taking: Reported on 01/28/2021 08/21/17   Dene Gentry, MD  diltiazem (CARDIZEM CD) 120 MG 24 hr capsule Take 1 capsule (120 mg total) by mouth daily. 07/26/20 07/26/21  Roxan Hockey, MD  doxepin (SINEQUAN) 10 MG capsule Take 10 mg by mouth at bedtime. 10/11/13   [provider]  doxycycline (VIBRA-TABS) 100 MG tablet Take 1 tablet (100 mg total) by mouth 2 (two) times daily. 01/29/21   Hunsucker, Bonna Gains, MD  doxycycline (VIBRAMYCIN) 100 MG capsule Take 1 capsule (100 mg total) by mouth 2 (two) times daily. 12/13/20   Scot Jun, FNP  DULoxetine (CYMBALTA) 60 MG capsule Take 1 capsule (60 mg total) by mouth 2 (two) times daily. 07/26/20   Roxan Hockey, MD  folic acid (FOLVITE) 1 MG tablet Take 1 tablet (1 mg total) by mouth daily. 07/27/20   Roxan Hockey, MD  furosemide (LASIX) 40 MG tablet Take 1 tablet (40 mg total) by mouth daily. Patient not taking: Reported on 01/28/2021 07/26/20 07/26/21  Roxan Hockey, MD  gabapentin (NEURONTIN) 300 MG capsule Take 300 mg by mouth 3 (three) times daily.    [provider]  gemfibrozil (LOPID) 600 MG tablet Take 600 mg by mouth 2 (two) times daily before a meal.    [provider]  ipratropium-albuterol (DUONEB) 0.5-2.5 (3) MG/3ML SOLN Take 3 mLs by nebulization every 4 (four) hours as needed. 07/26/20   Roxan Hockey, MD  isosorbide mononitrate (IMDUR) 30 MG 24 hr tablet Take 1 tablet (30 mg total) by mouth daily. 07/26/20   Roxan Hockey, MD  levothyroxine (SYNTHROID) 25 MCG tablet  Take 1 tablet (25 mcg total) by mouth daily at 6 (six) AM. 07/27/20   Terik Haughey, Courage, MD  LORazepam (ATIVAN) 0.5 MG tablet Take 1 tablet (0.5 mg total) by mouth 3 (three) times daily as needed for anxiety. 08/19/20   Manuella Ghazi, Pratik D, DO  magnesium hydroxide (MILK OF MAGNESIA) 400 MG/5ML suspension Take 15 mLs by mouth at bedtime.    [provider]  Melatonin 10 MG TBCR Take 60 mg by mouth at bedtime.    [provider]  Multiple Vitamin (MULTIVITAMIN) capsule Take 1 capsule by mouth daily.    [provider]  NITROSTAT 0.4 MG SL tablet Place 0.4 mg  under the tongue every 15 (fifteen) minutes as needed for chest pain.  09/22/13   [provider]  omeprazole (PRILOSEC) 40 MG capsule Take 1 capsule (40 mg total) by mouth daily. 01/03/20   Shahmehdi, Valeria Batman, MD  oxyCODONE (OXY IR/ROXICODONE) 5 MG immediate release tablet Take 1 tablet (5 mg total) by mouth every 4 (four) hours as needed for moderate pain or severe pain. 08/19/20   Manuella Ghazi, Pratik D, DO  Tiotropium Bromide-Olodaterol (STIOLTO RESPIMAT) 2.5-2.5 MCG/ACT AERS Inhale 2 puffs into the lungs daily. 12/07/20   Collene Gobble, MD  topiramate (TOPAMAX) 100 MG tablet Take 100 mg by mouth daily. 04/28/20   [provider]  valACYclovir (VALTREX) 500 MG tablet Take 500 mg by mouth 2 (two) times daily.    [provider]  vitamin B-12 (CYANOCOBALAMIN) 1000 MCG tablet Take 1 tablet (1,000 mcg total) by mouth daily. 07/26/20   Roxan Hockey, MD  Vitamin D, Ergocalciferol, (DRISDOL) 50000 UNITS CAPS capsule Take 1 Units by mouth every 7 (seven) days. Patient not taking: Reported on 01/28/2021 10/18/13   [provider]  XTAMPZA ER 18 MG C12A Take 1 capsule by mouth 2 (two) times daily. 08/19/20   Heath Lark D, DO    Physical Exam: Vitals:   04/29/21 1337 04/29/21 1345 04/29/21 1400 04/29/21 1633  BP:   (!) 157/59 (!) 140/54  Pulse:    94  Resp:   20 17  Temp:  99.7 F (37.6 C)     TempSrc:  Oral    SpO2: 99%  98% 96%  Weight:      Height:        Constitutional: NAD, calm, comfortable Vitals:   04/29/21 1337 04/29/21 1345 04/29/21 1400 04/29/21 1633  BP:   (!) 157/59 (!) 140/54  Pulse:    94  Resp:   20 17  Temp:  99.7 F (37.6 C)    TempSrc:  Oral    SpO2: 99%  98% 96%  Weight:      Height:       Eyes: Slight droop in the right eye from Bell's palsy,  conjunctivae normal ENMT: Mucous membranes are dry  Neck: normal, supple, no masses, no thyromegaly Respiratory: clear to auscultation bilaterally, no wheezing, no crackles. Normal respiratory effort. No accessory muscle use.  Cardiovascular: Regular rate and rhythm, no murmurs / rubs / gallops. No extremity edema. 2+ pedal pulses.  Abdomen: no tenderness, no masses palpated. No hepatosplenomegaly. Bowel sounds positive.  Musculoskeletal: no clubbing / cyanosis. No joint deformity upper and lower extremities. Good ROM, no contractures. Normal muscle tone.  Skin: no rashes, lesions, ulcers. No induration Neurologic: No apparent cranial nerve abnormality, moving extremities spontaneously Psychiatric: Normal judgment and insight. Alert and oriented x 3. Normal mood.   Labs on Admission: I have personally reviewed right eye from labs and imaging studies  CBC: Recent Labs  Lab 04/29/21 1441  WBC 21.7*  NEUTROABS 18.2*  HGB 5.3*  HCT 19.4*  MCV 75.8*  PLT 161*   Basic Metabolic Panel: Recent Labs  Lab 04/29/21 1441  NA 135  K 4.0  CL 100  CO2 26  GLUCOSE 161*  BUN 23  CREATININE 1.06*  CALCIUM 8.3*   BNP (last 3 results) Recent Labs    01/28/21 1329  PROBNP 73.0   Radiological Exams on Admission: DG Chest Port 1 View  Result Date: 04/29/2021 CLINICAL DATA:  Cough, dyspnea EXAM: PORTABLE CHEST 1 VIEW COMPARISON:  08/17/2020, 07/24/2020 FINDINGS: The  lungs are symmetrically well expanded. Trace reticular opacity has developed at the lung bases bilaterally, new since prior examination but  less severe than that seen on prior examination of 07/24/2020, possibly representing trace interstitial pulmonary edema. No pneumothorax or pleural effusion. Cardiac size within normal limits. No acute bone abnormality. IMPRESSION: Trace bibasilar reticular infiltrate, possibly representing trace pulmonary edema. Electronically Signed   By: Fidela Salisbury M.D.   On: 04/29/2021 14:50    EKG: Independently reviewed.  Sinus rhythm incomplete right bundle branch block.  QTc 470.  No significant change from prior.  Assessment/Plan Principal Problem:   Acute on chronic anemia Active Problems:   Chronic obstructive pulmonary disease/Emphysema   Dermatomyositis (HCC)   Essential hypertension   Chronic respiratory failure with hypoxia (HCC)   Symptomatic acute on chronic-hemoglobin 5.3, last check 6/23 hemoglobin was 7.  Stool FOBT in ED negative, no hx of melena hematemesis or hematochezia.  Last GI office visit take care everywhere [facility] at [facility].  I do not see any endoscopy results. -Transfuse 2 units -IV Protonix 40 daily -GI consult -N.p.o. midnight  ?? Pneumonia-reports persistent cough, dyspnea likely secondary to anemia, significant leukocytosis of 21.7.  Completed 5 day course of azithromycin- 9/18.  Temperature 99.7.  Rules out for sepsis.  Chest x-ray -trace bilateral reticular infiltrates possibly pulmonary edema - Obtain CTA chest -Levaquin given in the ED, hold further antibiotics for now pending imaging findings  Chest pain, atypical.  No CAD history. ?  Musculoskeletal from coughing versus demand 2/2 anemia.  EKG without significant changes. -  Follow-up CT  - Trend troponin  COPD, asthma on chronic respiratory failure on 3 L-stable. -As needed DuoNebs  Atrial fibrillation-rate controlled.  EKG shows sinus rhythm.  Not on anticoagulation. -Currently not taking diltiazem  Diastolic CHF-stable and compensated.  BNP elevated at 448.  No sign of peripheral edema.  Last  echo 2021 EF 7075%, normal LV diastolic parameters. -Currently not on diuretics  Dermatomyositis, chronic pain syndrome, fibromyalgia, muscular dystrophy -Continue home long-acting opiates twice daily  DVT prophylaxis: SCds for now Code Status: full code Family Communication: None at bedside Disposition Plan: ~ 2 days Consults called: GI Admission status: Obs, tele I certify that at the point of admission it is my clinical judgment that the patient will require inpatient hospital care spanning beyond 2 midnights from the point of admission due to high intensity of service, high risk for further deterioration and high frequency of surveillance required.    Bethena Roys MD [facility]  04/29/2021, 8:16 PM

## 2021-04-29 NOTE — ED Provider Notes (Signed)
This patient is a very pleasant 65 year old female history of chronic anemia but much worse today at 5.3, she has a white blood cell count of 21,000 and a notable left shift.  Recently took Zithromax for possible pulmonary infection,, has not been on steroids, history of COPD.  On exam lungs are relatively clear but has some rales at the bases mostly on the left, mental status is awake and alert, borderline tachycardia, borderline fever, metabolic panel is reassuring, BNP is 448, COVID is negative.  Chest x-ray without obvious large infiltrate but given her symptoms of coughing chest discomfort borderline fevers and chills and the large white count she will need to be admitted, Levaquin has been ordered, blood products have been ordered as the patient is severely anemic, critical care provided.  Medical screening examination/treatment/procedure(s) were conducted as a shared visit with non-physician practitioner(s) and myself.  I personally evaluated the patient during the encounter.  Clinical Impression:   Final diagnoses:  Community acquired pneumonia of left lower lobe of lung  Symptomatic anemia         Noemi Chapel, MD 05/04/21 1259

## 2021-04-29 NOTE — ED Provider Notes (Signed)
[facility] EMERGENCY DEPARTMENT Provider Note   CSN: [id number] Arrival date & time: 04/29/21  1310     History Chief Complaint  Patient presents with   Shortness of Breath    Kayla Ryan is a 65 y.o. female with a past medical history of COPD, chronic hypoxic respiratory failure, emphysema, CHF, polymyositis, muscular dystrophy who presents emergency department with shortness of breath.  Patient states that she developed laryngitis about 5 weeks ago.  2 weeks ago she began having painful productive cough, worse in all making dyspnea at rest and exertional dyspnea.  She denies PND orthopnea denies leg swelling.  She is on her baseline 3 L of oxygen but has been using her home neb treatments more frequently.  She has noticed recently that he is pink-tinged.  She states that she feels like she she has pneumonia.  She has had subjective fevers, body aches, chills, fatigue.  The history is provided by the patient and medical records.  Shortness of Breath     Past Medical History:  Diagnosis Date   Anemia    Arthritis    Asthma    Chest pain    Chronic bronchitis    Congestive heart failure (CHF) (HCC)    COPD (chronic obstructive pulmonary disease) ([place])    Crohn disease ([place])    Emphysema    Emphysema of lung (HCC)    Fibromyalgia    Herpes    Hyperlipidemia    IBS (irritable bowel syndrome)    Kidney failure    Migraines    Muscular dystrophy ([place])    Neck pain    Plantar fasciitis    Polymyositis ([place])    Scoliosis     Patient Active Problem List   Diagnosis Date Noted   Chronic respiratory failure with hypoxia ([place]) 01/28/2021   AKI (acute kidney injury) ([place]) 08/18/2020   Hypothyroidism 07/26/2020   Acute respiratory failure with hypoxia ([place]) 07/26/2020   B12 and Folate deficiency 07/26/2020   Folate deficiency anemia--B12 AND Folate deficiency 07/26/2020   Atrial fibrillation with RVR ([place]) 07/25/2020   Acute on chronic anemia 07/24/2020   Leukocytosis  07/24/2020   Pulmonary nodules 07/24/2020   Acute CHF (congestive heart failure) ([place]) 07/24/2020   Dyspnea 01/31/2020   Anemia 01/03/2020   Syncope 06/08/2019   Hypokalemia 06/08/2019   Trigger finger, acquired 11/03/2017   Chronic obstructive pulmonary disease/Emphysema 08/23/2017   Chronic pain 08/23/2017   History of Crohn's disease 08/23/2017   Myopathy 08/23/2017   Right shoulder injury, subsequent encounter 08/23/2017   Primary insomnia 02/01/2017   Esophageal dysphagia 10/06/2016   Essential hypertension 07/17/2016   Iron deficiency anemia 03/25/2016   History of Bell's palsy 03/03/2015   Nuclear sclerosis of both eyes 03/03/2015   Allergic rhinitis 05/18/2014   Anxiety 05/18/2014   Arthritis 05/18/2014   Asthma 05/18/2014   Chronic constipation 05/18/2014   Dermatomyositis ([place]) 05/18/2014   Migraine without status migrainosus, not intractable 05/18/2014   Rectocele 05/18/2014   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   Peripheral neuropathy 05/18/2014   Chest pain 10/30/2013   Hyperlipidemia 10/30/2013   Carotid artery disease ([place]) 10/30/2013    Past Surgical History:  Procedure Laterality Date   ABDOMINAL HYSTERECTOMY     bone spur     CHOLECYSTECTOMY     HERNIA REPAIR     LEFT HEART CATHETERIZATION WITH CORONARY ANGIOGRAM N/A 11/07/2013   Procedure: LEFT HEART CATHETERIZATION WITH CORONARY ANGIOGRAM;  Surgeon: Lorretta Harp, MD;  Location: [place] [facility] CATH LAB;  Service: Cardiovascular;  Laterality: N/A;   MASTECTOMY PARTIAL / LUMPECTOMY     OOPHORECTOMY     ROTATOR CUFF REPAIR       OB History   No obstetric history on file.     Family History  Problem Relation Age of Onset   Lung cancer Mother    COPD Father    Lung cancer Father    Lymphoma Father    Anxiety disorder Sister    Depression Brother     Social History   Tobacco Use   Smoking status: Former    Packs/day: 1.00    Years: 49.00    Pack years: 49.00    Types: Cigarettes     Start date: 08/08/1966    Quit date: 08/31/2005    Years since quitting: 15.6   Smokeless tobacco: Never  Vaping Use   Vaping Use: Never used  Substance Use Topics   Alcohol use: No   Drug use: No    Comment: used marijuana in teens    Home Medications Prior to Admission medications   Medication Sig Start Date End Date Taking? Authorizing Provider  acetaminophen (TYLENOL) 325 MG tablet Take 2 tablets (650 mg total) by mouth every 4 (four) hours as needed for headache or mild pain. Patient not taking: Reported on 01/28/2021 07/26/20   Roxan Hockey, MD  albuterol (PROAIR HFA) 108 (90 Base) MCG/ACT inhaler Inhale 2 puffs into the lungs every 6 (six) hours as needed for wheezing or shortness of breath. 07/26/20   Roxan Hockey, MD  cetirizine (ZYRTEC) 10 MG tablet Take 10 mg by mouth daily.    [provider]  Coenzyme Q10 10 MG capsule Take 40 mg by mouth daily. 4 capsules po qd    [provider]  diclofenac sodium (VOLTAREN) 1 % GEL Apply 2 g topically 4 (four) times daily as needed (for pain). Patient not taking: Reported on 01/28/2021 08/21/17   Dene Gentry, MD  diltiazem (CARDIZEM CD) 120 MG 24 hr capsule Take 1 capsule (120 mg total) by mouth daily. 07/26/20 07/26/21  Roxan Hockey, MD  doxepin (SINEQUAN) 10 MG capsule Take 10 mg by mouth at bedtime. 10/11/13   [provider]  doxycycline (VIBRA-TABS) 100 MG tablet Take 1 tablet (100 mg total) by mouth 2 (two) times daily. 01/29/21   Hunsucker, Bonna Gains, MD  doxycycline (VIBRAMYCIN) 100 MG capsule Take 1 capsule (100 mg total) by mouth 2 (two) times daily. 12/13/20   Scot Jun, FNP  DULoxetine (CYMBALTA) 60 MG capsule Take 1 capsule (60 mg total) by mouth 2 (two) times daily. 07/26/20   Roxan Hockey, MD  folic acid (FOLVITE) 1 MG tablet Take 1 tablet (1 mg total) by mouth daily. 07/27/20   Roxan Hockey, MD  furosemide (LASIX) 40 MG tablet Take 1 tablet (40 mg total) by mouth  daily. Patient not taking: Reported on 01/28/2021 07/26/20 07/26/21  Roxan Hockey, MD  gabapentin (NEURONTIN) 300 MG capsule Take 300 mg by mouth 3 (three) times daily.    [provider]  gemfibrozil (LOPID) 600 MG tablet Take 600 mg by mouth 2 (two) times daily before a meal.    [provider]  ipratropium-albuterol (DUONEB) 0.5-2.5 (3) MG/3ML SOLN Take 3 mLs by nebulization every 4 (four) hours as needed. 07/26/20   Roxan Hockey, MD  isosorbide mononitrate (IMDUR) 30 MG 24 hr tablet Take 1 tablet (30 mg total) by mouth daily. 07/26/20   Emokpae, Courage,  MD  levothyroxine (SYNTHROID) 25 MCG tablet Take 1 tablet (25 mcg total) by mouth daily at 6 (six) AM. 07/27/20   Emokpae, Courage, MD  LORazepam (ATIVAN) 0.5 MG tablet Take 1 tablet (0.5 mg total) by mouth 3 (three) times daily as needed for anxiety. 08/19/20   Manuella Ghazi, Pratik D, DO  magnesium hydroxide (MILK OF MAGNESIA) 400 MG/5ML suspension Take 15 mLs by mouth at bedtime.    [provider]  Melatonin 10 MG TBCR Take 60 mg by mouth at bedtime.    [provider]  Multiple Vitamin (MULTIVITAMIN) capsule Take 1 capsule by mouth daily.    [provider]  NITROSTAT 0.4 MG SL tablet Place 0.4 mg under the tongue every 15 (fifteen) minutes as needed for chest pain.  09/22/13   [provider]  omeprazole (PRILOSEC) 40 MG capsule Take 1 capsule (40 mg total) by mouth daily. 01/03/20   Shahmehdi, Valeria Batman, MD  oxyCODONE (OXY IR/ROXICODONE) 5 MG immediate release tablet Take 1 tablet (5 mg total) by mouth every 4 (four) hours as needed for moderate pain or severe pain. 08/19/20   Manuella Ghazi, Pratik D, DO  Tiotropium Bromide-Olodaterol (STIOLTO RESPIMAT) 2.5-2.5 MCG/ACT AERS Inhale 2 puffs into the lungs daily. 12/07/20   Collene Gobble, MD  topiramate (TOPAMAX) 100 MG tablet Take 100 mg by mouth daily. 04/28/20   [provider]  valACYclovir (VALTREX) 500 MG tablet Take 500 mg by mouth 2 (two)  times daily.    [provider]  vitamin B-12 (CYANOCOBALAMIN) 1000 MCG tablet Take 1 tablet (1,000 mcg total) by mouth daily. 07/26/20   Roxan Hockey, MD  Vitamin D, Ergocalciferol, (DRISDOL) 50000 UNITS CAPS capsule Take 1 Units by mouth every 7 (seven) days. Patient not taking: Reported on 01/28/2021 10/18/13   [provider]  XTAMPZA ER 18 MG C12A Take 1 capsule by mouth 2 (two) times daily. 08/19/20   Manuella Ghazi, Pratik D, DO    Allergies    Sulfa antibiotics, Dilaudid [hydromorphone hcl], Iron, Metronidazole, Prednisone, Cephalexin, Methotrexate derivatives, and Morphine and related  Review of Systems   Review of Systems  Respiratory:  Positive for shortness of breath.   Ten systems reviewed and are negative for acute change, except as noted in the HPI.   Physical Exam Updated Vital Signs BP (!) 134/42   Pulse 80   Temp 99.7 F (37.6 C) (Oral)   Resp (!) 24   Ht 5' 9"  (1.753 m)   Wt 82.6 kg   SpO2 99%   BMI 26.88 kg/m   Physical Exam Vitals and nursing note reviewed.  Constitutional:      General: She is not in acute distress.    Appearance: She is ill-appearing. She is not toxic-appearing or diaphoretic.  HENT:     Head: Normocephalic and atraumatic.     Right Ear: External ear normal.     Left Ear: External ear normal.     Nose: Nose normal.     Mouth/Throat:     Mouth: Mucous membranes are moist.  Eyes:     General: No scleral icterus.    Conjunctiva/sclera: Conjunctivae normal.  Cardiovascular:     Rate and Rhythm: Normal rate and regular rhythm.     Heart sounds: Normal heart sounds. No murmur heard.   No friction rub. No gallop.  Pulmonary:     Effort: Pulmonary effort is normal. No respiratory distress.     Breath sounds: Examination of the left-lower field reveals rales. Rales  present.  Abdominal:     General: Bowel sounds are normal. There is no distension.     Palpations: Abdomen is soft. There is no mass.     Tenderness: There is no  abdominal tenderness. There is no guarding.  Musculoskeletal:     Cervical back: Normal range of motion.  Skin:    General: Skin is warm and dry.  Neurological:     Mental Status: She is alert and oriented to person, place, and time.  Psychiatric:        Behavior: Behavior normal.    ED Results / Procedures / Treatments   Labs (all labs ordered are listed, but only abnormal results are displayed) Labs Reviewed  RESP PANEL BY RT-PCR (FLU A&B, COVID) [id number]  BASIC METABOLIC PANEL  BRAIN NATRIURETIC PEPTIDE  CBC WITH DIFFERENTIAL/PLATELET    EKG None  Radiology No results found.  Procedures .Critical Care Performed by: Margarita Mail, PA-C Authorized by: Margarita Mail, PA-C   Critical care provider statement:    Critical care time (minutes):  55   Critical care time was exclusive of:  Separately billable procedures and treating other patients   Critical care was necessary to treat or prevent imminent or life-threatening deterioration of the following conditions:  Circulatory failure   Critical care was time spent personally by me on the following activities:  Discussions with consultants, evaluation of patient's response to treatment, examination of patient, ordering and performing treatments and interventions, ordering and review of laboratory studies, ordering and review of radiographic studies, pulse oximetry, re-evaluation of patient's condition, obtaining history from patient or surrogate and review of old charts   Medications Ordered in ED Medications - No data to display  ED Course  I have reviewed the triage vital signs and the nursing notes.  Pertinent labs & imaging results that were available during my care of the patient were reviewed by me and considered in my medical decision making (see chart for details).    MDM Rules/Calculators/A&P                         65 year old female with a past medical history of COPD and chronic hypoxic respiratory failure  presents emergency department chief complaint of shortness of breat. The emergent differential diagnosis for shortness of breath includes, but is not limited to, Pulmonary edema, bronchoconstriction, Pneumonia, Pulmonary embolism, Pneumotherax/ Hemothorax, Dysrythmia, ACS.   I ordered and reviewed labs and includes CBC which shows significantly elevated white blood cell count at 21,000 and hemoglobin of 5.3 representing a 2 g drop in patient's hemoglobin as well as a thrombocytosis and left shift. BMP with mildly elevated blood glucose and slightly elevated creatinine with normal BUN of insignificant value.  BNP mildly elevated at 448. I ordered and reviewed a 1 view chest x-ray which shows bilateral reticular infiltrates.  Clinically the patient likely has pneumonia with hyperresonant breath sounds in the left lower lung field. I ordered and reviewed an EKG shows sinus rhythm at a rate of 83.  Patient is critically ill with acute anemia and negative Hemoccult.  I have ordered 2 units for transfusion here in the emergency department.  Patient also clinically appears to have community and not acquired pneumonia.  I have begun the patient on Levaquin.  Patient will need admission to the hospital for further management.  Case discussed with Dr. Denton Brick    Final Clinical Impression(s) / ED Diagnoses Final diagnoses:  None    Rx /  DC Orders ED Discharge Orders     None        Margarita Mail, PA-C 04/30/21 [place], [place], DO 04/30/21 2002

## 2021-04-30 ENCOUNTER — Inpatient Hospital Stay (HOSPITAL_COMMUNITY): Payer: Medicare Other | Admitting: Anesthesiology

## 2021-04-30 ENCOUNTER — Encounter (HOSPITAL_COMMUNITY)
Admission: EM | Disposition: A | Discharge status: Home / Self Care | Payer: Self-pay | Source: Home / Self Care | Attending: Internal Medicine

## 2021-04-30 DIAGNOSIS — I5032 Chronic diastolic (congestive) heart failure: Secondary | ICD-10-CM | POA: Diagnosis not present

## 2021-04-30 DIAGNOSIS — J9611 Chronic respiratory failure with hypoxia: Secondary | ICD-10-CM | POA: Diagnosis not present

## 2021-04-30 DIAGNOSIS — I4891 Unspecified atrial fibrillation: Secondary | ICD-10-CM | POA: Diagnosis not present

## 2021-04-30 DIAGNOSIS — K529 Noninfective gastroenteritis and colitis, unspecified: Secondary | ICD-10-CM | POA: Diagnosis present

## 2021-04-30 DIAGNOSIS — J189 Pneumonia, unspecified organism: Secondary | ICD-10-CM | POA: Diagnosis present

## 2021-04-30 DIAGNOSIS — M3390 Dermatopolymyositis, unspecified, organ involvement unspecified: Secondary | ICD-10-CM | POA: Diagnosis not present

## 2021-04-30 DIAGNOSIS — Z818 Family history of other mental and behavioral disorders: Secondary | ICD-10-CM | POA: Diagnosis not present

## 2021-04-30 DIAGNOSIS — I10 Essential (primary) hypertension: Secondary | ICD-10-CM | POA: Diagnosis not present

## 2021-04-30 DIAGNOSIS — J439 Emphysema, unspecified: Secondary | ICD-10-CM | POA: Diagnosis not present

## 2021-04-30 DIAGNOSIS — R059 Cough, unspecified: Secondary | ICD-10-CM | POA: Diagnosis not present

## 2021-04-30 DIAGNOSIS — M3320 Polymyositis, organ involvement unspecified: Secondary | ICD-10-CM | POA: Diagnosis not present

## 2021-04-30 DIAGNOSIS — K219 Gastro-esophageal reflux disease without esophagitis: Secondary | ICD-10-CM | POA: Diagnosis present

## 2021-04-30 DIAGNOSIS — Z807 Family history of other malignant neoplasms of lymphoid, hematopoietic and related tissues: Secondary | ICD-10-CM | POA: Diagnosis not present

## 2021-04-30 DIAGNOSIS — R0602 Shortness of breath: Secondary | ICD-10-CM | POA: Diagnosis present

## 2021-04-30 DIAGNOSIS — E785 Hyperlipidemia, unspecified: Secondary | ICD-10-CM | POA: Diagnosis present

## 2021-04-30 DIAGNOSIS — D649 Anemia, unspecified: Secondary | ICD-10-CM | POA: Diagnosis not present

## 2021-04-30 DIAGNOSIS — D509 Iron deficiency anemia, unspecified: Principal | ICD-10-CM

## 2021-04-30 DIAGNOSIS — I517 Cardiomegaly: Secondary | ICD-10-CM | POA: Diagnosis not present

## 2021-04-30 DIAGNOSIS — M419 Scoliosis, unspecified: Secondary | ICD-10-CM | POA: Diagnosis present

## 2021-04-30 DIAGNOSIS — G894 Chronic pain syndrome: Secondary | ICD-10-CM | POA: Diagnosis present

## 2021-04-30 DIAGNOSIS — Z801 Family history of malignant neoplasm of trachea, bronchus and lung: Secondary | ICD-10-CM | POA: Diagnosis not present

## 2021-04-30 DIAGNOSIS — I480 Paroxysmal atrial fibrillation: Secondary | ICD-10-CM | POA: Diagnosis not present

## 2021-04-30 DIAGNOSIS — J449 Chronic obstructive pulmonary disease, unspecified: Secondary | ICD-10-CM | POA: Diagnosis not present

## 2021-04-30 DIAGNOSIS — R1310 Dysphagia, unspecified: Secondary | ICD-10-CM

## 2021-04-30 DIAGNOSIS — Z825 Family history of asthma and other chronic lower respiratory diseases: Secondary | ICD-10-CM | POA: Diagnosis not present

## 2021-04-30 DIAGNOSIS — Z20822 Contact with and (suspected) exposure to covid-19: Secondary | ICD-10-CM | POA: Diagnosis not present

## 2021-04-30 DIAGNOSIS — K5090 Crohn's disease, unspecified, without complications: Secondary | ICD-10-CM | POA: Diagnosis not present

## 2021-04-30 DIAGNOSIS — G629 Polyneuropathy, unspecified: Secondary | ICD-10-CM | POA: Diagnosis present

## 2021-04-30 DIAGNOSIS — M1990 Unspecified osteoarthritis, unspecified site: Secondary | ICD-10-CM | POA: Diagnosis present

## 2021-04-30 DIAGNOSIS — G7100 Muscular dystrophy, unspecified: Secondary | ICD-10-CM | POA: Diagnosis present

## 2021-04-30 DIAGNOSIS — M3313 Other dermatomyositis without myopathy: Secondary | ICD-10-CM | POA: Diagnosis not present

## 2021-04-30 DIAGNOSIS — I110 Hypertensive heart disease with heart failure: Secondary | ICD-10-CM | POA: Diagnosis not present

## 2021-04-30 DIAGNOSIS — M797 Fibromyalgia: Secondary | ICD-10-CM | POA: Diagnosis present

## 2021-04-30 HISTORY — PX: COLONOSCOPY WITH PROPOFOL: SHX5780

## 2021-04-30 HISTORY — PX: ESOPHAGOGASTRODUODENOSCOPY (EGD) WITH PROPOFOL: SHX5813

## 2021-04-30 HISTORY — PX: ESOPHAGEAL DILATION: SHX303

## 2021-04-30 LAB — CBC
HCT: 27.9 % — ABNORMAL LOW (ref 36.0–46.0)
Hemoglobin: 8 g/dL — ABNORMAL LOW (ref 12.0–15.0)
MCH: 22.7 pg — ABNORMAL LOW (ref 26.0–34.0)
MCHC: 28.7 g/dL — ABNORMAL LOW (ref 30.0–36.0)
MCV: 79.3 fL — ABNORMAL LOW (ref 80.0–100.0)
Platelets: 357 10*3/uL (ref 150–400)
RBC: 3.52 MIL/uL — ABNORMAL LOW (ref 3.87–5.11)
RDW: 18.8 % — ABNORMAL HIGH (ref 11.5–15.5)
WBC: 17.6 10*3/uL — ABNORMAL HIGH (ref 4.0–10.5)
nRBC: 0.3 % — ABNORMAL HIGH (ref 0.0–0.2)

## 2021-04-30 LAB — BASIC METABOLIC PANEL
Anion gap: 11 (ref 5–15)
BUN: 19 mg/dL (ref 8–23)
CO2: 24 mmol/L (ref 22–32)
Calcium: 8.6 mg/dL — ABNORMAL LOW (ref 8.9–10.3)
Chloride: 102 mmol/L (ref 98–111)
Creatinine, Ser: 0.89 mg/dL (ref 0.44–1.00)
GFR, Estimated: >60 mL/min (ref >60)
Glucose, Bld: 109 mg/dL — ABNORMAL HIGH (ref 70–99)
Potassium: 4.1 mmol/L (ref 3.5–5.1)
Sodium: 137 mmol/L (ref 135–145)

## 2021-04-30 LAB — PROCALCITONIN: Procalcitonin: 2.84 ng/mL

## 2021-04-30 LAB — BRAIN NATRIURETIC PEPTIDE
B Natriuretic Peptide: 384 pg/mL — ABNORMAL HIGH (ref 0.0–100.0)
B Natriuretic Peptide: 232 pg/mL — ABNORMAL HIGH (ref 0.0–100.0)

## 2021-04-30 SURGERY — COLONOSCOPY WITH PROPOFOL
Anesthesia: General

## 2021-04-30 MED ORDER — BISACODYL 5 MG PO TBEC
10.0000 mg | DELAYED_RELEASE_TABLET | Freq: Once | ORAL | Status: AC
  Administered 2021-04-30: 10 mg via ORAL
  Filled 2021-04-30: qty 2

## 2021-04-30 MED ORDER — LEVOFLOXACIN IN D5W 750 MG/150ML IV SOLN
750.0000 mg | INTRAVENOUS | Status: DC
Start: 2021-04-30 — End: 2021-05-03
  Administered 2021-04-30 – 2021-05-02 (×3): 750 mg via INTRAVENOUS
  Filled 2021-04-30 (×3): qty 150

## 2021-04-30 MED ORDER — PEG 3350-KCL-NABCB-NACL-NASULF 236 G PO SOLR
4000.0000 mL | Freq: Once | ORAL | Status: AC
  Administered 2021-04-30: 4000 mL via ORAL
  Filled 2021-04-30: qty 4000

## 2021-04-30 MED ORDER — IPRATROPIUM-ALBUTEROL 0.5-2.5 (3) MG/3ML IN SOLN
3.0000 mL | Freq: Three times a day (TID) | RESPIRATORY_TRACT | Status: DC
  Administered 2021-04-30: 3 mL via RESPIRATORY_TRACT

## 2021-04-30 MED ORDER — METOPROLOL TARTRATE 5 MG/5ML IV SOLN
5.0000 mg | INTRAVENOUS | Status: DC | PRN
  Administered 2021-05-01: 5 mg via INTRAVENOUS
  Filled 2021-04-30: qty 5

## 2021-04-30 MED ORDER — PANTOPRAZOLE SODIUM 40 MG IV SOLR
40.0000 mg | Freq: Two times a day (BID) | INTRAVENOUS | Status: DC
  Administered 2021-04-30 – 2021-05-02 (×6): 40 mg via INTRAVENOUS
  Filled 2021-04-30 (×7): qty 40

## 2021-04-30 MED ORDER — ALBUTEROL SULFATE (2.5 MG/3ML) 0.083% IN NEBU: 2.5000 mg | INHALATION_SOLUTION | RESPIRATORY_TRACT | Status: DC | PRN

## 2021-04-30 MED ORDER — SENNOSIDES-DOCUSATE SODIUM 8.6-50 MG PO TABS: 1.0000 | ORAL_TABLET | Freq: Every evening | ORAL | Status: DC | PRN

## 2021-04-30 MED ORDER — PROPOFOL 10 MG/ML IV BOLUS
INTRAVENOUS | Status: AC
  Filled 2021-04-30: qty 20

## 2021-04-30 MED ORDER — PROPOFOL 10 MG/ML IV BOLUS
INTRAVENOUS | Status: AC
  Filled 2021-04-30: qty 40

## 2021-04-30 MED ORDER — ALBUTEROL SULFATE (2.5 MG/3ML) 0.083% IN NEBU
2.5000 mg | INHALATION_SOLUTION | Freq: Two times a day (BID) | RESPIRATORY_TRACT | Status: DC
  Administered 2021-05-01 – 2021-05-03 (×5): 2.5 mg via RESPIRATORY_TRACT
  Filled 2021-04-30 (×5): qty 3

## 2021-04-30 MED ORDER — SODIUM CHLORIDE 0.9 % IV SOLN: INTRAVENOUS | Status: DC

## 2021-04-30 MED ORDER — PROPOFOL 10 MG/ML IV BOLUS
INTRAVENOUS | Status: DC | PRN
  Administered 2021-04-30: 180 mg via INTRAVENOUS
  Administered 2021-04-30: 140 mg via INTRAVENOUS
  Administered 2021-04-30: 180 mg via INTRAVENOUS
  Administered 2021-04-30: 160 mg via INTRAVENOUS

## 2021-04-30 MED ORDER — HYDRALAZINE HCL 20 MG/ML IJ SOLN: 10.0000 mg | INTRAMUSCULAR | Status: DC | PRN

## 2021-04-30 MED ORDER — ALBUTEROL SULFATE (2.5 MG/3ML) 0.083% IN NEBU
2.5000 mg | INHALATION_SOLUTION | Freq: Three times a day (TID) | RESPIRATORY_TRACT | Status: DC
  Administered 2021-04-30 (×2): 2.5 mg via RESPIRATORY_TRACT
  Filled 2021-04-30 (×2): qty 3

## 2021-04-30 NOTE — Op Note (Signed)
[facility] Patient Name: Kayla Ryan Procedure Date: 04/30/2021 2:02 PM MRN: [id number] Date of Birth: 07-19-1956 Attending MD: Norvel Richards , MD CSN: [id number] Age: 65 Admit Type: Inpatient Procedure:                Upper GI endoscopy Indications:              Iron deficiency anemia, Dysphagia Providers:                Norvel Richards, MD, Janeece Riggers, RN, Aram Candela Referring MD:              Medicines:                Propofol per Anesthesia Complications:            No immediate complications. Estimated Blood Loss:     Estimated blood loss: none. Estimated blood loss:                            none. Procedure:                Pre-Anesthesia Assessment:                           - Prior to the procedure, a History and Physical                            was performed, and patient medications and                            allergies were reviewed. The patient's tolerance of                            previous anesthesia was also reviewed. The risks                            and benefits of the procedure and the sedation                            options and risks were discussed with the patient.                            All questions were answered, and informed consent                            was obtained. Prior Anticoagulants: The patient has                            taken no previous anticoagulant or antiplatelet                            agents. ASA Grade Assessment: III - A patient with  severe systemic disease. After reviewing the risks                            and benefits, the patient was deemed in                            satisfactory condition to undergo the procedure.                           After obtaining informed consent, the endoscope was                            passed under direct vision. Throughout the                            procedure, the patient's blood pressure,  pulse, and                            oxygen saturations were monitored continuously. The                            [id number]-[id number] ([id number]) scope was introduced through the                            mouth, and advanced to the third part of duodenum.                            The upper GI endoscopy was accomplished without                            difficulty. The patient tolerated the procedure                            well. Scope In: 4:27:46 PM Scope Out: 4:34:08 PM Total Procedure Duration: 0 hours 6 minutes 22 seconds  Findings:      The examined esophagus was normal.      The entire examined stomach was normal.      The duodenal bulb, second portion of the duodenum and third portion of       the duodenum were normal. The scope was withdrawn. Dilation was       performed with a Maloney dilator with moderate resistance at 56 Fr. The       dilation site was examined following endoscope reinsertion and showed no       change. Estimated blood loss: none. Impression:               - Normal esophagus. Status post esophageal dilation                           - Normal stomach.                           - Normal duodenal bulb, second portion of the  duodenum and third portion of the duodenum.                           - No specimens collected. Moderate Sedation:      Moderate (conscious) sedation was personally administered by an       anesthesia professional. The following parameters were monitored: oxygen       saturation, heart rate, blood pressure, respiratory rate, EKG, adequacy       of pulmonary ventilation, and response to care. Recommendation:           - Return patient to hospital ward for ongoing care.                           - Advance diet as tolerated.                           - Continue present medications. continue daily PPI                           - Return to my office (date not yet determined).                            see colonoscopy  report Procedure Code(s):        --- Professional ---                           [phone], Esophagogastroduodenoscopy, flexible,                            transoral; diagnostic, including collection of                            specimen(s) by brushing or washing, when performed                            (separate procedure)                           43450, Dilation of esophagus, by unguided sound or                            bougie, single or multiple passes Diagnosis Code(s):        --- Professional ---                           D50.9, Iron deficiency anemia, unspecified                           R13.10, Dysphagia, unspecified CPT copyright 2019 American Medical Association. All rights reserved. The codes documented in this report are preliminary and upon coder review may  be revised to meet current compliance requirements. Cristopher Estimable. Wesson Stith, MD Norvel Richards, MD 04/30/2021 5:36:34 PM This report has been signed electronically. Number of Addenda: 0

## 2021-04-30 NOTE — Anesthesia Preprocedure Evaluation (Signed)
Anesthesia Evaluation  Patient identified by MRN, date of birth, ID band Patient awake    Reviewed: Allergy & Precautions, H&P , NPO status , Patient's Chart, lab work & pertinent test results, reviewed documented beta blocker date and time   Airway Mallampati: II  TM Distance: >3 FB Neck ROM: full    Dental no notable dental hx.    Pulmonary shortness of breath, asthma , COPD, former smoker,    Pulmonary exam normal breath sounds clear to auscultation       Cardiovascular Exercise Tolerance: Good hypertension, +CHF   Rhythm:regular Rate:Normal     Neuro/Psych Anxiety negative neurological ROS  negative psych ROS   GI/Hepatic negative GI ROS, Neg liver ROS,   Endo/Other  Hypothyroidism   Renal/GU ARFnegative Renal ROS  negative genitourinary   Musculoskeletal negative musculoskeletal ROS (+)   Abdominal   Peds negative pediatric ROS (+)  Hematology  (+) Blood dyscrasia, anemia ,   Anesthesia Other Findings   Reproductive/Obstetrics negative OB ROS                             Anesthesia Physical Anesthesia Plan  ASA: 4 and emergent  Anesthesia Plan: General   Post-op Pain Management:    Induction:   PONV Risk Score and Plan: Propofol infusion  Airway Management Planned:   Additional Equipment:   Intra-op Plan:   Post-operative Plan:   Informed Consent: I have reviewed the patients History and Physical, chart, labs and discussed the procedure including the risks, benefits and alternatives for the proposed anesthesia with the patient or authorized representative who has indicated his/her understanding and acceptance.     Dental Advisory Given  Plan Discussed with: CRNA  Anesthesia Plan Comments:         Anesthesia Quick Evaluation

## 2021-04-30 NOTE — Consult Note (Signed)
Pharmacy Antibiotic Note  Kayla Ryan is a 65 y.o. female admitted on 04/29/2021 with pneumonia.  Pharmacy has been consulted for levofloxacin dosing.  Plan: Will continue Levaquin therapy at 750m IV q24hrs.  IV to PO conversion to be considered based on improvement in patient status.  Height: 5' 9"  (175.3 cm) Weight: 82.6 kg (182 lb) IBW/kg (Calculated) : 66.2  Temp (24hrs), Avg:99.1 F (37.3 C), Min:98.4 F (36.9 C), Max:100.8 F (38.2 C)  Recent Labs  Lab 04/29/21 1441 04/30/21 0534  WBC 21.7* 17.6*  CREATININE 1.06* 0.89    Estimated Creatinine Clearance: 70.5 mL/min (by C-G formula based on SCr of 0.89 mg/dL).    Allergies  Allergen Reactions   Sulfa Antibiotics Shortness Of Breath, Swelling and Rash   Dilaudid [Hydromorphone Hcl]     Makes me crazy   Iron     Throat starts closing up, tongue swells, severe headaches and backpain   Metronidazole Diarrhea and Nausea And Vomiting   Prednisone Other (See Comments)    angry angry    Cephalexin Diarrhea and Nausea And Vomiting   Methotrexate Derivatives Swelling and Rash   Morphine And Related Anxiety    "exreme mood swings"    Thank you for allowing pharmacy to be a part of this patient's care.  SBerta Minor9/23/2022 12:39 PM

## 2021-04-30 NOTE — Op Note (Addendum)
[facility] Patient Name: Kayla Ryan Procedure Date: 04/30/2021 4:36 PM MRN: [id number] Date of Birth: 07-30-1956 Attending MD: Norvel Richards , MD CSN: [id number] Age: 65 Admit Type: Inpatient Procedure:                Colonoscopy Indications:              Iron deficiency anemia Providers:                Norvel Richards, MD, Janeece Riggers, RN, Aram Candela Referring MD:              Medicines:                Propofol per Anesthesia Complications:            No immediate complications. Estimated Blood Loss:     Estimated blood loss: none. Procedure:                Pre-Anesthesia Assessment:                           - Prior to the procedure, a History and Physical                            was performed, and patient medications and                            allergies were reviewed. The patient's tolerance of                            previous anesthesia was also reviewed. The risks                            and benefits of the procedure and the sedation                            options and risks were discussed with the patient.                            All questions were answered, and informed consent                            was obtained. Prior Anticoagulants: The patient has                            taken no previous anticoagulant or antiplatelet                            agents. ASA Grade Assessment: III - A patient with                            severe systemic disease. After reviewing the risks  and benefits, the patient was deemed in                            satisfactory condition to undergo the procedure.                           After obtaining informed consent, the colonoscope                            was passed under direct vision. Throughout the                            procedure, the patient's blood pressure, pulse, and                            oxygen saturations were monitored  continuously. The                            [id number] ([id number]) was introduced through the                            anus and advanced to the the cecum, identified by                            appendiceal orifice and ileocecal valve. The                            colonoscopy was performed without difficulty. The                            patient tolerated the procedure well. The quality                            of the bowel preparation was adequate. Scope In: 4:38:36 PM Scope Out: 5:26:12 PM Scope Withdrawal Time: 0 hours 16 minutes 16 seconds  Total Procedure Duration: 0 hours 47 minutes 36 seconds  Findings:      The perianal and digital rectal examinations were normal.      Scattered medium-mouthed diverticula were found in the sigmoid colon.       Elongated and r3dundant colon requiring external abdominal pressure and       changing of the patients position to reach the cecum.      The exam was otherwise without abnormality on direct and retroflexion       views. Impression:               - Diverticulosis in the sigmoid colon. Redundant                            and elongated colon. Elongated, redundant colon                           - The examination was otherwise normal on direct  and retroflexion views.                           - No specimens collected. May benefit from                            outpatient capsule endoscopy to wrap up the GI                            evaluation for iron deficiency anemia. Moderate Sedation:      Moderate (conscious) sedation was personally administered by an       anesthesia professional. The following parameters were monitored: oxygen       saturation, heart rate, blood pressure, respiratory rate, EKG, adequacy       of pulmonary ventilation, and response to care. Recommendation:           - Patient has a contact number available for                            emergencies. The signs and symptoms of  potential                            delayed complications were discussed with the                            patient. Return to normal activities tomorrow.                            Written discharge instructions were provided to the                            patient.                           - Advance diet as tolerated.                           - Continue present medications.                           - No repeat colonoscopy due to current age (31                            years or older).                           - Return to GI clinic (date not yet determined). We                            will plan for an outpatient capsule a small                            intestine. Procedure Code(s):        --- Professional ---  45378, Colonoscopy, flexible; diagnostic, including                            collection of specimen(s) by brushing or washing,                            when performed (separate procedure) Diagnosis Code(s):        --- Professional ---                           D50.9, Iron deficiency anemia, unspecified                           K57.30, Diverticulosis of large intestine without                            perforation or abscess without bleeding CPT copyright 2019 American Medical Association. All rights reserved. The codes documented in this report are preliminary and upon coder review may  be revised to meet current compliance requirements. Cristopher Estimable. [name], MD Norvel Richards, MD 04/30/2021 5:44:29 PM This report has been signed electronically. Number of Addenda: 0

## 2021-04-30 NOTE — Anesthesia Postprocedure Evaluation (Signed)
Anesthesia Post Note  Patient: CAROLE DONER  Procedure(s) Performed: COLONOSCOPY WITH PROPOFOL ESOPHAGOGASTRODUODENOSCOPY (EGD) WITH PROPOFOL ESOPHAGEAL DILATION  Patient location during evaluation: PACU Anesthesia Type: General Level of consciousness: awake and alert Pain management: pain level controlled Vital Signs Assessment: post-procedure vital signs reviewed and stable Respiratory status: spontaneous breathing, nonlabored ventilation, respiratory function stable and patient connected to nasal cannula oxygen Cardiovascular status: blood pressure returned to baseline and stable Postop Assessment: no apparent nausea or vomiting Anesthetic complications: no   No notable events documented.   Last Vitals:  Vitals:   04/30/21 1532 04/30/21 1629  BP: (!) 150/66   Pulse: 82 86  Resp: 18 16  Temp:  36.7 C  SpO2: 97% 98%    Last Pain:  Vitals:   04/30/21 1629  TempSrc: Oral  PainSc: 0-No pain                 Louann Sjogren

## 2021-04-30 NOTE — Progress Notes (Signed)
PROGRESS NOTE    Kayla Ryan  QZR:[id number] DOB: 06-Oct-1955 DOA: 04/29/2021 PCP: Guadlupe Spanish, MD   Brief Narrative:  65 year old with history of COPD on chronic 3 L nasal cannula, dermatomyositis, Bell's palsy, Crohn's disease, HTN, CHF, A. fib comes to the hospital with difficulty breathing ongoing for about the last 5 days.  She also reported a weakness.  Chest x-ray showed by lateral reticular opacity with concerns of pulmonary edema.  Her hemoglobin was noted to be 5.3.  She was started on Levaquin, and GI team was consulted.   Assessment & Plan:   Principal Problem:   Acute on chronic anemia Active Problems:   Chronic obstructive pulmonary disease/Emphysema   Dermatomyositis (HCC)   Essential hypertension   Chronic respiratory failure with hypoxia (HCC)   Symptomatic acute on chronic- -Symptomatic anemia.  Admission hemoglobin 5.3, baseline around 7.0.  FOBT negative.  Status post 2 unit PRBC transfusion, hemoglobin stable.  GI consulted  Multifocal pneumonia, bilateral lower lobes left greater than right  -Procalcitonin slightly elevated.  Recently completed azithromycin course but will start patient on Levaquin.  As needed bronchodilators, I-S/flutter.   Chest pain, atypical.   -Unlikely cardiac.  CTA shows bilateral lower lobe opacities left greater than right.   COPD, asthma on chronic respiratory failure on 3 L-stable. -As needed bronchodilators   Atrial fibrillation-rate controlled.   -Patient is on anticoagulations at home.   CHF with preserved ejection fraction, 75%.  Stable and compensated.  BNP elevated at 448.  No sign of peripheral edema.  Last echo 2021 EF 7075%, normal LV diastolic parameters. -Holding of diuretics.   Dermatomyositis, chronic pain syndrome, fibromyalgia, muscular dystrophy -Continue home long-acting opiates twice daily    DVT prophylaxis: SCDs Start: 04/29/21 2005  Code Status: Full Family Communication:     Dispo: The  patient is from: Home              Anticipated d/c is to: Home              Patient currently is not medically stable to d/c.  Maintain hospital stay until cleared by GI   Difficult to place patient No      Subjective: Patient breathing slightly better this morning and feels she has little more energy after getting blood transfusion.  Review of Systems Otherwise negative except as per HPI, including: General: Denies fever, chills, night sweats or unintended weight loss. Resp: Denies cough, wheezing, shortness of breath. Cardiac: Denies chest pain, palpitations, orthopnea, paroxysmal nocturnal dyspnea. GI: Denies abdominal pain, nausea, vomiting, diarrhea or constipation GU: Denies dysuria, frequency, hesitancy or incontinence MS: Denies muscle aches, joint pain or swelling Neuro: Denies headache, neurologic deficits (focal weakness, numbness, tingling), abnormal gait Psych: Denies anxiety, depression, SI/HI/AVH Skin: Denies new rashes or lesions ID: Denies sick contacts, exotic exposures, travel  Examination:  General exam: Appears calm and comfortable  Respiratory system: Clear to auscultation. Respiratory effort normal. Cardiovascular system: S1 & S2 heard, RRR. No JVD, murmurs, rubs, gallops or clicks. No pedal edema. Gastrointestinal system: Abdomen is nondistended, soft and nontender. No organomegaly or masses felt. Normal bowel sounds heard. Central nervous system: Alert and oriented. No focal neurological deficits. Extremities: Symmetric 5 x 5 power. Skin: No rashes, lesions or ulcers Psychiatry: Judgement and insight appear normal. Mood & affect appropriate.     Objective: Vitals:   04/30/21 0551 04/30/21 0800 04/30/21 0802 04/30/21 0844  BP: (!) 151/65 (!) 143/54    Pulse: 74 74  Resp: 18     Temp: 98.5 F (36.9 C)     TempSrc: Oral     SpO2: 100% 99% 96% 97%  Weight:      Height:        Intake/Output Summary (Last 24 hours) at 04/30/2021 1224 Last data  filed at 04/30/2021 0500 Gross per 24 hour  Intake 968 ml  Output --  Net 968 ml   Filed Weights   04/29/21 1332  Weight: 82.6 kg     Data Reviewed:   CBC: Recent Labs  Lab 04/29/21 1441 04/30/21 0534  WBC 21.7* 17.6*  NEUTROABS 18.2*  --   HGB 5.3* 8.0*  HCT 19.4* 27.9*  MCV 75.8* 79.3*  PLT 435* 357   Basic Metabolic Panel: Recent Labs  Lab 04/29/21 1441 04/30/21 0534  NA 135 137  K 4.0 4.1  CL 100 102  CO2 26 24  GLUCOSE 161* 109*  BUN 23 19  CREATININE 1.06* 0.89  CALCIUM 8.3* 8.6*   GFR: Estimated Creatinine Clearance: 70.5 mL/min (by C-G formula based on SCr of 0.89 mg/dL). Liver Function Tests: No results for input(s): AST, ALT, ALKPHOS, BILITOT, PROT, ALBUMIN in the last 168 hours. No results for input(s): LIPASE, AMYLASE in the last 168 hours. No results for input(s): AMMONIA in the last 168 hours. Coagulation Profile: No results for input(s): INR, PROTIME in the last 168 hours. Cardiac Enzymes: No results for input(s): CKTOTAL, CKMB, CKMBINDEX, TROPONINI in the last 168 hours. BNP (last 3 results) Recent Labs    01/28/21 1329  PROBNP 73.0   HbA1C: No results for input(s): HGBA1C in the last 72 hours. CBG: No results for input(s): GLUCAP in the last 168 hours. Lipid Profile: No results for input(s): CHOL, HDL, LDLCALC, TRIG, CHOLHDL, LDLDIRECT in the last 72 hours. Thyroid Function Tests: No results for input(s): TSH, T4TOTAL, FREET4, T3FREE, THYROIDAB in the last 72 hours. Anemia Panel: Recent Labs    04/29/21 1441  VITAMINB12 490  FOLATE 8.2  FERRITIN 11  TIBC 532*  IRON 8*  RETICCTPCT 3.9*   Sepsis Labs: Recent Labs  Lab 04/30/21 0534  PROCALCITON 2.84    Recent Results (from the past 240 hour(s))  Resp Panel by RT-PCR (Flu A&B, Covid) Nasopharyngeal Swab     Status: None   Collection Time: 04/29/21  2:27 PM   Specimen: Nasopharyngeal Swab; Nasopharyngeal(NP) swabs in vial transport medium  Result Value Ref Range Status    SARS Coronavirus 2 by RT PCR NEGATIVE NEGATIVE Final    Comment: (NOTE) SARS-CoV-2 target nucleic acids are NOT DETECTED.  The SARS-CoV-2 RNA is generally detectable in upper respiratory specimens during the acute phase of infection. The lowest concentration of SARS-CoV-2 viral copies this assay can detect is 138 copies/mL. A negative result does not preclude SARS-Cov-2 infection and should not be used as the sole basis for treatment or other patient management decisions. A negative result may occur with  improper specimen collection/handling, submission of specimen other than nasopharyngeal swab, presence of viral mutation(s) within the areas targeted by this assay, and inadequate number of viral copies(<138 copies/mL). A negative result must be combined with clinical observations, patient history, and epidemiological information. The expected result is Negative.  Fact Sheet for Patients:  EntrepreneurPulse.com.au  Fact Sheet for Healthcare Providers:  IncredibleEmployment.be  This test is no t yet approved or cleared by the [place] FDA and  has been authorized for detection and/or diagnosis of SARS-CoV-2 by FDA under an Emergency Use Authorization (EUA).  This EUA will remain  in effect (meaning this test can be used) for the duration of the COVID-19 declaration under Section 564(b)(1) of the Act, 21 U.S.C.section 360bbb-3(b)(1), unless the authorization is terminated  or revoked sooner.       Influenza A by PCR NEGATIVE NEGATIVE Final   Influenza B by PCR NEGATIVE NEGATIVE Final    Comment: (NOTE) The Xpert Xpress SARS-CoV-2/FLU/RSV plus assay is intended as an aid in the diagnosis of influenza from Nasopharyngeal swab specimens and should not be used as a sole basis for treatment. Nasal washings and aspirates are unacceptable for Xpert Xpress SARS-CoV-2/FLU/RSV testing.  Fact Sheet for  Patients: EntrepreneurPulse.com.au  Fact Sheet for Healthcare Providers: IncredibleEmployment.be  This test is not yet approved or cleared by the [place] FDA and has been authorized for detection and/or diagnosis of SARS-CoV-2 by FDA under an Emergency Use Authorization (EUA). This EUA will remain in effect (meaning this test can be used) for the duration of the COVID-19 declaration under Section 564(b)(1) of the Act, 21 U.S.C. section 360bbb-3(b)(1), unless the authorization is terminated or revoked.  Performed at [facility], [address], [place], [place] [address]          Radiology Studies: CT Angio Chest Pulmonary Embolism (PE) W or WO Contrast  Result Date: 04/30/2021 CLINICAL DATA:  Productive cough. EXAM: CT ANGIOGRAPHY CHEST WITH CONTRAST TECHNIQUE: Multidetector CT imaging of the chest was performed using the standard protocol during bolus administration of intravenous contrast. Multiplanar CT image reconstructions and MIPs were obtained to evaluate the vascular anatomy. CONTRAST:  79m OMNIPAQUE IOHEXOL 350 MG/ML SOLN COMPARISON:  Chest CT dated 01/26/2021. Radiograph dated 04/29/2021. FINDINGS: Cardiovascular: Mild cardiomegaly. No pericardial effusion. The thoracic aorta is unremarkable. The origins of the great vessels of the arch appear patent. Evaluation of the pulmonary arteries is limited due to respiratory motion artifact. No pulmonary artery embolus identified. Mediastinum/Nodes: Top-normal subcarinal lymph node measures 12 mm. The esophagus is grossly unremarkable. No mediastinal fluid collection. Lungs/Pleura: Background of emphysema. Bilateral lower lobe airspace consolidation, left greater right most consistent with pneumonia. Aspiration is not excluded clinical correlation is recommended. No pleural effusion or pneumothorax. The central airways are patent. Upper Abdomen: No acute abnormality. Musculoskeletal: No chest wall  abnormality. No acute or significant osseous findings. Review of the MIP images confirms the above findings. IMPRESSION: 1. No CT evidence of pulmonary embolism. 2. Bilateral lower lobe airspace consolidation, left greater right most consistent with pneumonia. Aspiration is not excluded. 3. Aortic Atherosclerosis (ICD10-I70.0) and Emphysema (ICD10-J43.9). Electronically Signed   By: AAnner CreteM.D.   On: 04/30/2021 01:51   DG Chest Port 1 View  Result Date: 04/29/2021 CLINICAL DATA:  Cough, dyspnea EXAM: PORTABLE CHEST 1 VIEW COMPARISON:  08/17/2020, 07/24/2020 FINDINGS: The lungs are symmetrically well expanded. Trace reticular opacity has developed at the lung bases bilaterally, new since prior examination but less severe than that seen on prior examination of 07/24/2020, possibly representing trace interstitial pulmonary edema. No pneumothorax or pleural effusion. Cardiac size within normal limits. No acute bone abnormality. IMPRESSION: Trace bibasilar reticular infiltrate, possibly representing trace pulmonary edema. Electronically Signed   By: AFidela SalisburyM.D.   On: 04/29/2021 14:50        Scheduled Meds:  albuterol  2.5 mg Nebulization TID   arformoterol  15 mcg Nebulization BID   And   umeclidinium bromide  1 puff Inhalation Daily   bisacodyl  10 mg Oral Once   DULoxetine  60 mg  Oral BID   gabapentin  300 mg Oral TID   gemfibrozil  600 mg Oral BID AC   loratadine  10 mg Oral Daily   montelukast  10 mg Oral Daily   oxyCODONE  15 mg Oral Q12H   pantoprazole (PROTONIX) IV  40 mg Intravenous Q12H   topiramate  25 mg Oral Daily   valACYclovir  500 mg Oral BID   Continuous Infusions:  sodium chloride 20 mL/hr at 04/30/21 1155     LOS: 0 days   Time spent= 35 mins    Jaelee Laughter Arsenio Loader, MD [facility]  If 7PM-7AM, please contact night-coverage  04/30/2021, 12:24 PM
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
## 2021-04-30 NOTE — Transfer of Care (Signed)
Immediate Anesthesia Transfer of Care Note  Patient: Kayla Ryan  Procedure(s) Performed: COLONOSCOPY WITH PROPOFOL ESOPHAGOGASTRODUODENOSCOPY (EGD) WITH PROPOFOL ESOPHAGEAL DILATION  Patient Location: PACU  Anesthesia Type:General  Level of Consciousness: drowsy  Airway & Oxygen Therapy: Patient Spontanous Breathing and Patient connected to nasal cannula oxygen  Post-op Assessment: Report given to RN and Post -op Vital signs reviewed and stable  Post vital signs: Reviewed and stable  Last Vitals:  Vitals Value Taken Time  BP    Temp    Pulse    Resp    SpO2      Last Pain:  Vitals:   04/30/21 1629  TempSrc: Oral  PainSc: 0-No pain      Patients Stated Pain Goal: 0 (14/83/07 3543)  Complications: No notable events documented.

## 2021-04-30 NOTE — Telephone Encounter (Signed)
Agree that she needed to go to ED for evaluation. Thank you for letting me know.

## 2021-05-01 DIAGNOSIS — D649 Anemia, unspecified: Secondary | ICD-10-CM | POA: Diagnosis not present

## 2021-05-01 DIAGNOSIS — J9611 Chronic respiratory failure with hypoxia: Secondary | ICD-10-CM | POA: Diagnosis not present

## 2021-05-01 DIAGNOSIS — I480 Paroxysmal atrial fibrillation: Secondary | ICD-10-CM

## 2021-05-01 DIAGNOSIS — M3390 Dermatopolymyositis, unspecified, organ involvement unspecified: Secondary | ICD-10-CM

## 2021-05-01 DIAGNOSIS — I4891 Unspecified atrial fibrillation: Secondary | ICD-10-CM | POA: Diagnosis not present

## 2021-05-01 DIAGNOSIS — I10 Essential (primary) hypertension: Secondary | ICD-10-CM

## 2021-05-01 HISTORY — DX: Paroxysmal atrial fibrillation: I48.0

## 2021-05-01 LAB — T4, FREE: Free T4: 0.99 ng/dL (ref 0.61–1.12)

## 2021-05-01 LAB — CBC
HCT: 27.5 % — ABNORMAL LOW (ref 36.0–46.0)
HCT: 31.2 % — ABNORMAL LOW (ref 36.0–46.0)
Hemoglobin: 7.8 g/dL — ABNORMAL LOW (ref 12.0–15.0)
Hemoglobin: 8.9 g/dL — ABNORMAL LOW (ref 12.0–15.0)
MCH: 22.7 pg — ABNORMAL LOW (ref 26.0–34.0)
MCH: 23.1 pg — ABNORMAL LOW (ref 26.0–34.0)
MCHC: 28.4 g/dL — ABNORMAL LOW (ref 30.0–36.0)
MCHC: 28.5 g/dL — ABNORMAL LOW (ref 30.0–36.0)
MCV: 80.2 fL (ref 80.0–100.0)
MCV: 81 fL (ref 80.0–100.0)
Platelets: 366 10*3/uL (ref 150–400)
Platelets: 295 10*3/uL (ref 150–400)
RBC: 3.43 MIL/uL — ABNORMAL LOW (ref 3.87–5.11)
RBC: 3.85 MIL/uL — ABNORMAL LOW (ref 3.87–5.11)
RDW: 19.9 % — ABNORMAL HIGH (ref 11.5–15.5)
RDW: 20.1 % — ABNORMAL HIGH (ref 11.5–15.5)
WBC: 14 10*3/uL — ABNORMAL HIGH (ref 4.0–10.5)
WBC: 13.6 10*3/uL — ABNORMAL HIGH (ref 4.0–10.5)
nRBC: 0.2 % (ref 0.0–0.2)
nRBC: 0.4 % — ABNORMAL HIGH (ref 0.0–0.2)

## 2021-05-01 LAB — MRSA NEXT GEN BY PCR, NASAL: MRSA by PCR Next Gen: NOT DETECTED

## 2021-05-01 LAB — TISSUE TRANSGLUTAMINASE, IGA: Tissue Transglutaminase Ab, IgA: <2 U/mL (ref 0–3)

## 2021-05-01 LAB — FOLATE: Folate: 8 ng/mL (ref >5.9)

## 2021-05-01 LAB — TSH: TSH: 3.918 u[IU]/mL (ref 0.350–4.500)

## 2021-05-01 LAB — BASIC METABOLIC PANEL
Anion gap: 10 (ref 5–15)
BUN: 15 mg/dL (ref 8–23)
CO2: 25 mmol/L (ref 22–32)
Calcium: 8.5 mg/dL — ABNORMAL LOW (ref 8.9–10.3)
Chloride: 100 mmol/L (ref 98–111)
Creatinine, Ser: 0.93 mg/dL (ref 0.44–1.00)
GFR, Estimated: >60 mL/min (ref >60)
Glucose, Bld: 94 mg/dL (ref 70–99)
Potassium: 3.5 mmol/L (ref 3.5–5.1)
Sodium: 135 mmol/L (ref 135–145)

## 2021-05-01 LAB — GLUCOSE, CAPILLARY: Glucose-Capillary: 168 mg/dL — ABNORMAL HIGH (ref 70–99)

## 2021-05-01 LAB — MAGNESIUM: Magnesium: 1.7 mg/dL (ref 1.7–2.4)

## 2021-05-01 LAB — VITAMIN B12: Vitamin B-12: 570 pg/mL (ref 180–914)

## 2021-05-01 LAB — IGA: IgA: 188 mg/dL (ref 87–352)

## 2021-05-01 LAB — TROPONIN I (HIGH SENSITIVITY): Troponin I (High Sensitivity): 22 ng/L — ABNORMAL HIGH (ref <18)

## 2021-05-01 MED ORDER — POTASSIUM CHLORIDE CRYS ER 20 MEQ PO TBCR
20.0000 meq | EXTENDED_RELEASE_TABLET | Freq: Once | ORAL | Status: AC
  Administered 2021-05-01: 20 meq via ORAL
  Filled 2021-05-01: qty 1

## 2021-05-01 MED ORDER — OXYCODONE HCL 5 MG PO TABS
5.0000 mg | ORAL_TABLET | Freq: Four times a day (QID) | ORAL | Status: DC | PRN
Start: 2021-05-01 — End: 2021-05-03
  Administered 2021-05-01 – 2021-05-02 (×3): 5 mg via ORAL
  Filled 2021-05-01 (×3): qty 1

## 2021-05-01 MED ORDER — CHLORHEXIDINE GLUCONATE CLOTH 2 % EX PADS
6.0000 | MEDICATED_PAD | Freq: Every day | CUTANEOUS | Status: DC
  Administered 2021-05-01 – 2021-05-02 (×2): 6 via TOPICAL

## 2021-05-01 MED ORDER — DILTIAZEM HCL-DEXTROSE 125-5 MG/125ML-% IV SOLN (PREMIX)
5.0000 mg/h | INTRAVENOUS | Status: DC
  Administered 2021-05-01 – 2021-05-02 (×2): 5 mg/h via INTRAVENOUS
  Filled 2021-05-01 (×3): qty 125

## 2021-05-01 MED ORDER — DIPHENHYDRAMINE HCL 25 MG PO CAPS
25.0000 mg | ORAL_CAPSULE | Freq: Once | ORAL | Status: AC
  Administered 2021-05-01: 25 mg via ORAL
  Filled 2021-05-01: qty 1

## 2021-05-01 MED ORDER — MAGNESIUM SULFATE 2 GM/50ML IV SOLN
2.0000 g | Freq: Once | INTRAVENOUS | Status: AC
  Administered 2021-05-01: 2 g via INTRAVENOUS
  Filled 2021-05-01: qty 50

## 2021-05-01 MED ORDER — DILTIAZEM LOAD VIA INFUSION
5.0000 mg | Freq: Once | INTRAVENOUS | Status: AC
  Administered 2021-05-01: 5 mg via INTRAVENOUS
  Filled 2021-05-01: qty 5

## 2021-05-01 NOTE — Progress Notes (Signed)
Pt HR bouncing between 140-160s. EKG shows pt in afib RVR. Prn Metoprolol given and night shift MD as well as day shift MD made aware. Pt is asymptomatic otherwise.

## 2021-05-01 NOTE — Progress Notes (Signed)
PROGRESS NOTE  Kayla Ryan VQX:[id number] DOB: 12-24-1955 DOA: 04/29/2021 PCP: Guadlupe Spanish, MD  Brief History:  65 year old with history of COPD on chronic 3 L nasal cannula, dermatomyositis, Bell's palsy, Crohn's disease, HTN, CHF, A. fib comes to the hospital with difficulty breathing ongoing for about the last 5 days.  She also reported a weakness.  Chest x-ray showed by lateral reticular opacity with concerns of pulmonary edema.  Her hemoglobin was noted to be 5.3.  She was started on Levaquin, and GI team was consulted.  Assessment/Plan:   Symptomatic acute on chronic/Iron Deficiency Anemia -Symptomatic anemia.  Admission hemoglobin 5.3, baseline around 8-9.  FOBT negative.   -Status post 2 unit PRBC transfusion, hemoglobin stable.   -GI consulted -9/23 Colonoscopy--sigmoid diverticulosis, redundant colon -9/23 EGD--normal esophagus, stomach, duodenal bulb/duodenum  Paroxysmal Atrial Fibrillation with RVR -not on AC due to recurrent anemia -developed RVR am 9/24 -move to SDU -start diltiazem drip -personally reviewed EKG--afib HR 140, nonspecific ST change- -Echo -TSH   Multifocal pneumonia, bilateral lower lobes left greater than right  -Procalcitonin 2.84.   -Recently completed azithromycin course but will start patient on Levaquin this admission.   Chest pain, atypical.   -Unlikely cardiac.   -CTA shows bilateral lower lobe opacities left greater than right; negative PR   COPD, asthma on chronic respiratory failure on 3 L -stable. -As needed bronchodilators -continue Brovana, incruse   Chronic CHF with preserved ejection fraction, 75%.  Stable and compensated.  BNP elevated at 448.  No sign of peripheral edema.  Last echo 2021 EF 7075%, normal LV diastolic parameters. -Holding of diuretics temporarily   Dermatomyositis, chronic pain syndrome, fibromyalgia, muscular dystrophy -Continue home long-acting opiates twice daily -continue gabapentin        Status is: Inpatient  Remains inpatient appropriate because:Inpatient level of care appropriate due to severity of illness  Dispo: The patient is from: Home              Anticipated d/c is to: Home              Patient currently is not medically stable to d/c.   Difficult to place patient No        Family Communication:  no Family at bedside  Consultants:  none  Code Status:  FULL   DVT Prophylaxis:  SCDs   Procedures: As Listed in Progress Note Above  Antibiotics: Levoflox 9/22>>      Subjective: Patient denies fevers, chills, headache, nausea, vomiting, diarrhea, abdominal pain, dysuria, hematuria, hematochezia, and melena. Patient is breathing better.  Has intermittent chest discomfort  Objective: Vitals:   04/30/21 2057 05/01/21 0104 05/01/21 0500 05/01/21 0716  BP: 130/67 (!) 130/113 (!) 126/57 128/89  Pulse: 83 88  (!) 149  Resp: 20 20 20 20   Temp: 97.8 F (36.6 C) 100 F (37.8 C) 98.8 F (37.1 C) 98.3 F (36.8 C)  TempSrc: Oral Oral Oral Oral  SpO2: 91% 99% 90% 92%  Weight:      Height:        Intake/Output Summary (Last 24 hours) at 05/01/2021 0034 Last data filed at 05/01/2021 0327 Gross per 24 hour  Intake 1062.55 ml  Output --  Net 1062.55 ml   Weight change:  Exam:  General:  Pt is alert, follows commands appropriately, not in acute distress HEENT: No icterus, No thrush, No neck mass, [place]/AT Cardiovascular: RRR, S1/S2, no rubs, no gallops Respiratory: bibasilar rales.  No wheeze Abdomen: Soft/+BS, non tender, non distended, no guarding Extremities: No edema, No lymphangitis, No petechiae, No rashes, no synovitis   Data Reviewed: I have personally reviewed following labs and imaging studies Basic Metabolic Panel: Recent Labs  Lab 04/29/21 1441 04/30/21 0534  NA 135 137  K 4.0 4.1  CL 100 102  CO2 26 24  GLUCOSE 161* 109*  BUN 23 19  CREATININE 1.06* 0.89  CALCIUM 8.3* 8.6*   Liver Function Tests: No results for  input(s): AST, ALT, ALKPHOS, BILITOT, PROT, ALBUMIN in the last 168 hours. No results for input(s): LIPASE, AMYLASE in the last 168 hours. No results for input(s): AMMONIA in the last 168 hours. Coagulation Profile: No results for input(s): INR, PROTIME in the last 168 hours. CBC: Recent Labs  Lab 04/29/21 1441 04/30/21 0534  WBC 21.7* 17.6*  NEUTROABS 18.2*  --   HGB 5.3* 8.0*  HCT 19.4* 27.9*  MCV 75.8* 79.3*  PLT 435* 357   Cardiac Enzymes: No results for input(s): CKTOTAL, CKMB, CKMBINDEX, TROPONINI in the last 168 hours. BNP: Invalid input(s): POCBNP CBG: No results for input(s): GLUCAP in the last 168 hours. HbA1C: No results for input(s): HGBA1C in the last 72 hours. Urine analysis:    Component Value Date/Time   COLORURINE STRAW (A) 06/08/2019 0103   APPEARANCEUR CLEAR 06/08/2019 0103   LABSPEC 1.005 06/08/2019 0103   PHURINE 7.0 06/08/2019 0103   GLUCOSEU NEGATIVE 06/08/2019 0103   HGBUR NEGATIVE 06/08/2019 0103   BILIRUBINUR small (A) 12/13/2020 1559   KETONESUR trace (5) (A) 12/13/2020 1559   KETONESUR NEGATIVE 06/08/2019 0103   PROTEINUR >=300 (A) 12/13/2020 1559   PROTEINUR NEGATIVE 06/08/2019 0103   UROBILINOGEN 2.0 (A) 12/13/2020 1559   UROBILINOGEN 0.2 06/08/2011 1400   NITRITE Positive (A) 12/13/2020 1559   NITRITE NEGATIVE 06/08/2019 0103   LEUKOCYTESUR Large (3+) (A) 12/13/2020 1559   LEUKOCYTESUR NEGATIVE 06/08/2019 0103   Sepsis Labs: @LABRCNTIP (procalcitonin:4,lacticidven:4) ) Recent Results (from the past 240 hour(s))  Resp Panel by RT-PCR (Flu A&B, Covid) Nasopharyngeal Swab     Status: None   Collection Time: 04/29/21  2:27 PM   Specimen: Nasopharyngeal Swab; Nasopharyngeal(NP) swabs in vial transport medium  Result Value Ref Range Status   SARS Coronavirus 2 by RT PCR NEGATIVE NEGATIVE Final    Comment: (NOTE) SARS-CoV-2 target nucleic acids are NOT DETECTED.  The SARS-CoV-2 RNA is generally detectable in upper respiratory specimens  during the acute phase of infection. The lowest concentration of SARS-CoV-2 viral copies this assay can detect is 138 copies/mL. A negative result does not preclude SARS-Cov-2 infection and should not be used as the sole basis for treatment or other patient management decisions. A negative result may occur with  improper specimen collection/handling, submission of specimen other than nasopharyngeal swab, presence of viral mutation(s) within the areas targeted by this assay, and inadequate number of viral copies(<138 copies/mL). A negative result must be combined with clinical observations, patient history, and epidemiological information. The expected result is Negative.  Fact Sheet for Patients:  EntrepreneurPulse.com.au  Fact Sheet for Healthcare Providers:  IncredibleEmployment.be  This test is no t yet approved or cleared by the [place] FDA and  has been authorized for detection and/or diagnosis of SARS-CoV-2 by FDA under an Emergency Use Authorization (EUA). This EUA will remain  in effect (meaning this test can be used) for the duration of the COVID-19 declaration under Section 564(b)(1) of the Act, 21 U.S.C.section 360bbb-3(b)(1), unless the authorization is terminated  or revoked sooner.       Influenza A by PCR NEGATIVE NEGATIVE Final   Influenza B by PCR NEGATIVE NEGATIVE Final    Comment: (NOTE) The Xpert Xpress SARS-CoV-2/FLU/RSV plus assay is intended as an aid in the diagnosis of influenza from Nasopharyngeal swab specimens and should not be used as a sole basis for treatment. Nasal washings and aspirates are unacceptable for Xpert Xpress SARS-CoV-2/FLU/RSV testing.  Fact Sheet for Patients: EntrepreneurPulse.com.au  Fact Sheet for Healthcare Providers: IncredibleEmployment.be  This test is not yet approved or cleared by the [place] FDA and has been authorized for detection  and/or diagnosis of SARS-CoV-2 by FDA under an Emergency Use Authorization (EUA). This EUA will remain in effect (meaning this test can be used) for the duration of the COVID-19 declaration under Section 564(b)(1) of the Act, 21 U.S.C. section 360bbb-3(b)(1), unless the authorization is terminated or revoked.  Performed at [facility], [address] [place], [place] [address]      Scheduled Meds:  albuterol  2.5 mg Nebulization BID   arformoterol  15 mcg Nebulization BID   And   umeclidinium bromide  1 puff Inhalation Daily   DULoxetine  60 mg Oral BID   gabapentin  300 mg Oral TID   gemfibrozil  600 mg Oral BID AC   loratadine  10 mg Oral Daily   montelukast  10 mg Oral Daily   oxyCODONE  15 mg Oral Q12H   pantoprazole (PROTONIX) IV  40 mg Intravenous Q12H   topiramate  25 mg Oral Daily   valACYclovir  500 mg Oral BID   Continuous Infusions:  levofloxacin (LEVAQUIN) IV Stopped (04/30/21 2020)    Procedures/Studies: CT Angio Chest Pulmonary Embolism (PE) W or WO Contrast  Result Date: 04/30/2021 CLINICAL DATA:  Productive cough. EXAM: CT ANGIOGRAPHY CHEST WITH CONTRAST TECHNIQUE: Multidetector CT imaging of the chest was performed using the standard protocol during bolus administration of intravenous contrast. Multiplanar CT image reconstructions and MIPs were obtained to evaluate the vascular anatomy. CONTRAST:  33m OMNIPAQUE IOHEXOL 350 MG/ML SOLN COMPARISON:  Chest CT dated 01/26/2021. Radiograph dated 04/29/2021. FINDINGS: Cardiovascular: Mild cardiomegaly. No pericardial effusion. The thoracic aorta is unremarkable. The origins of the great vessels of the arch appear patent. Evaluation of the pulmonary arteries is limited due to respiratory motion artifact. No pulmonary artery embolus identified. Mediastinum/Nodes: Top-normal subcarinal lymph node measures 12 mm. The esophagus is grossly unremarkable. No mediastinal fluid collection. Lungs/Pleura: Background of emphysema.  Bilateral lower lobe airspace consolidation, left greater right most consistent with pneumonia. Aspiration is not excluded clinical correlation is recommended. No pleural effusion or pneumothorax. The central airways are patent. Upper Abdomen: No acute abnormality. Musculoskeletal: No chest wall abnormality. No acute or significant osseous findings. Review of the MIP images confirms the above findings. IMPRESSION: 1. No CT evidence of pulmonary embolism. 2. Bilateral lower lobe airspace consolidation, left greater right most consistent with pneumonia. Aspiration is not excluded. 3. Aortic Atherosclerosis (ICD10-I70.0) and Emphysema (ICD10-J43.9). Electronically Signed   By: AAnner CreteM.D.   On: 04/30/2021 01:51   DG Chest Port 1 View  Result Date: 04/29/2021 CLINICAL DATA:  Cough, dyspnea EXAM: PORTABLE CHEST 1 VIEW COMPARISON:  08/17/2020, 07/24/2020 FINDINGS: The lungs are symmetrically well expanded. Trace reticular opacity has developed at the lung bases bilaterally, new since prior examination but less severe than that seen on prior examination of 07/24/2020, possibly representing trace interstitial pulmonary edema. No pneumothorax or pleural effusion. Cardiac size within normal limits.  No acute bone abnormality. IMPRESSION: Trace bibasilar reticular infiltrate, possibly representing trace pulmonary edema. Electronically Signed   By: Fidela Salisbury M.D.   On: 04/29/2021 14:50    Orson Eva, DO  [facility]  If 7PM-7AM, please contact night-coverage [email or web address] Password [facility] 05/01/2021, 7:33 AM   LOS: 1 day

## 2021-05-01 NOTE — Plan of Care (Signed)

## 2021-05-02 ENCOUNTER — Inpatient Hospital Stay (HOSPITAL_COMMUNITY): Payer: Medicare Other

## 2021-05-02 DIAGNOSIS — J189 Pneumonia, unspecified organism: Secondary | ICD-10-CM | POA: Diagnosis not present

## 2021-05-02 DIAGNOSIS — D649 Anemia, unspecified: Secondary | ICD-10-CM | POA: Diagnosis not present

## 2021-05-02 DIAGNOSIS — J9611 Chronic respiratory failure with hypoxia: Secondary | ICD-10-CM | POA: Diagnosis not present

## 2021-05-02 DIAGNOSIS — I4891 Unspecified atrial fibrillation: Secondary | ICD-10-CM | POA: Diagnosis not present

## 2021-05-02 LAB — TYPE AND SCREEN
ABO/RH(D): A NEG
Antibody Screen: NEGATIVE
Unit division: 0
Unit division: 0

## 2021-05-02 LAB — CBC
HCT: 30 % — ABNORMAL LOW (ref 36.0–46.0)
Hemoglobin: 8.2 g/dL — ABNORMAL LOW (ref 12.0–15.0)
MCH: 22.2 pg — ABNORMAL LOW (ref 26.0–34.0)
MCHC: 27.3 g/dL — ABNORMAL LOW (ref 30.0–36.0)
MCV: 81.3 fL (ref 80.0–100.0)
Platelets: 385 10*3/uL (ref 150–400)
RBC: 3.69 MIL/uL — ABNORMAL LOW (ref 3.87–5.11)
RDW: 21 % — ABNORMAL HIGH (ref 11.5–15.5)
WBC: 13.8 10*3/uL — ABNORMAL HIGH (ref 4.0–10.5)
nRBC: 0.2 % (ref 0.0–0.2)

## 2021-05-02 LAB — ECHOCARDIOGRAM COMPLETE
AR max vel: 2.44 cm2
AV Area VTI: 2.11 cm2
AV Area mean vel: 2.6 cm2
AV Mean grad: 8.9 mmHg
AV Peak grad: 18.8 mmHg
Ao pk vel: 2.17 m/s
Area-P 1/2: 4.29 cm2
Height: 69 in
S' Lateral: 2.9 cm
Weight: 2912 oz

## 2021-05-02 LAB — BPAM RBC
Blood Product Expiration Date: 202210092359
Blood Product Expiration Date: 202210182359
ISSUE DATE / TIME: 202209222029
ISSUE DATE / TIME: 202209230206
Unit Type and Rh: 600
Unit Type and Rh: 600

## 2021-05-02 LAB — BASIC METABOLIC PANEL
Anion gap: 8 (ref 5–15)
BUN: 15 mg/dL (ref 8–23)
CO2: 27 mmol/L (ref 22–32)
Calcium: 8.8 mg/dL — ABNORMAL LOW (ref 8.9–10.3)
Chloride: 98 mmol/L (ref 98–111)
Creatinine, Ser: 1.04 mg/dL — ABNORMAL HIGH (ref 0.44–1.00)
GFR, Estimated: 59 mL/min — ABNORMAL LOW (ref >60)
Glucose, Bld: 134 mg/dL — ABNORMAL HIGH (ref 70–99)
Potassium: 3.5 mmol/L (ref 3.5–5.1)
Sodium: 133 mmol/L — ABNORMAL LOW (ref 135–145)

## 2021-05-02 LAB — MAGNESIUM: Magnesium: 2.1 mg/dL (ref 1.7–2.4)

## 2021-05-02 LAB — PROCALCITONIN: Procalcitonin: 0.81 ng/mL

## 2021-05-02 MED ORDER — DILTIAZEM HCL 30 MG PO TABS
30.0000 mg | ORAL_TABLET | Freq: Four times a day (QID) | ORAL | Status: DC
  Administered 2021-05-02 – 2021-05-03 (×4): 30 mg via ORAL
  Filled 2021-05-02 (×4): qty 1

## 2021-05-02 MED ORDER — MAGIC MOUTHWASH
10.0000 mL | Freq: Four times a day (QID) | ORAL | Status: DC | PRN
  Administered 2021-05-02: 10 mL via ORAL
  Filled 2021-05-02 (×3): qty 10

## 2021-05-02 NOTE — Progress Notes (Signed)
  Echocardiogram 2D Echocardiogram has been performed.  Kayla Ryan 05/02/2021, 10:11 AM

## 2021-05-02 NOTE — Progress Notes (Signed)
PROGRESS NOTE  Kayla Ryan ZOX:[id number] DOB: 09-22-1955 DOA: 04/29/2021 PCP: Guadlupe Spanish, MD  Brief History:  65 year old with history of COPD on chronic 3 L nasal cannula, dermatomyositis, Bell's palsy, Crohn's disease, HTN, CHF, A. fib comes to the hospital with difficulty breathing ongoing for about the last 5 days.  She also reported a weakness.  Chest x-ray showed by lateral reticular opacity with concerns of pulmonary edema.  Her hemoglobin was noted to be 5.3.  She was started on Levaquin, and GI team was consulted.   Assessment/Plan:   Symptomatic acute on chronic/Iron Deficiency Anemia -Symptomatic anemia.  Admission hemoglobin 5.3, baseline around 8-9.  FOBT negative.   -Status post 2 unit PRBC transfusion, hemoglobin stable after transfuseion   -GI consulted -9/23 Colonoscopy--sigmoid diverticulosis, redundant colon -9/23 EGD--normal esophagus, stomach, duodenal bulb/duodenum   Paroxysmal Atrial Fibrillation with RVR -not on AC due to recurrent anemia -developed RVR am 9/24 -back into sinus evening 9/24 -started diltiazem drip>>po diltiazem 9/25 -personally reviewed EKG--afib HR 140, nonspecific ST change- -Echo--EF 65-70%, no WMA, normal RV, trivial MR -TSH--3.918   Multifocal pneumonia, bilateral lower lobes left greater than right  -Procalcitonin 2.84.   -Recently completed azithromycin course but will start patient on Levaquin this admission.   Chest pain, atypical.   -Unlikely cardiac.   -CTA shows bilateral lower lobe opacities left greater than right; negative PR -troponins not consistent with ACS -Echo reassuring   COPD, asthma on chronic respiratory failure on 3 L -stable. -As needed bronchodilators -continue Brovana, incruse   Chronic CHF with preserved ejection fraction, 75%.  Stable and compensated.  BNP elevated at 448.  No sign of peripheral edema.  Last echo 2021 EF 7075%, normal LV diastolic parameters. -Holding of diuretics  temporarily   Dermatomyositis, chronic pain syndrome, fibromyalgia, muscular dystrophy -Continue home long-acting opiates twice daily -continue gabapentin           Status is: Inpatient   Remains inpatient appropriate because:Inpatient level of care appropriate due to severity of illness   Dispo: The patient is from: Home              Anticipated d/c is to: Home              Patient currently is not medically stable to d/c.              Difficult to place patient No               Family Communication:  no Family at bedside   Consultants:  none   Code Status:  FULL    DVT Prophylaxis:  SCDs     Procedures: As Listed in Progress Note Above   Antibiotics: Levoflox 9/22>>   Subjective: Patient denies fevers, chills, headache, chest pain, dyspnea, nausea, vomiting, diarrhea, abdominal pain, dysuria, hematuria, hematochezia, and melena.   Objective: Vitals:   05/02/21 0900 05/02/21 1000 05/02/21 1100 05/02/21 1108  BP: (!) 107/49 96/64 131/72   Pulse: 77 78 77 77  Resp: 17 17 18 17   Temp:    98.1 F (36.7 C)  TempSrc:    Oral  SpO2: (!) 89% 92% 93% 94%  Weight:      Height:        Intake/Output Summary (Last 24 hours) at 05/02/2021 1355 Last data filed at 05/02/2021 0500 Gross per 24 hour  Intake 339.86 ml  Output 1400 ml  Net -1060.14 ml   Weight  change:  Exam:  General:  Pt is alert, follows commands appropriately, not in acute distress HEENT: No icterus, No thrush, No neck mass, [place]/AT Cardiovascular: RRR, S1/S2, no rubs, no gallops Respiratory: bibasilar rales. No wheeze Abdomen: Soft/+BS, non tender, non distended, no guarding Extremities: No edema, No lymphangitis, No petechiae, No rashes, no synovitis   Data Reviewed: I have personally reviewed following labs and imaging studies Basic Metabolic Panel: Recent Labs  Lab 04/29/21 1441 04/30/21 0534 05/01/21 0545 05/02/21 0414  NA 135 137 135 133*  K 4.0 4.1 3.5 3.5  CL 100 102 100 98   CO2 26 24 25 27   GLUCOSE 161* 109* 94 134*  BUN 23 19 15 15   CREATININE 1.06* 0.89 0.93 1.04*  CALCIUM 8.3* 8.6* 8.5* 8.8*  MG  --   --  1.7 2.1   Liver Function Tests: No results for input(s): AST, ALT, ALKPHOS, BILITOT, PROT, ALBUMIN in the last 168 hours. No results for input(s): LIPASE, AMYLASE in the last 168 hours. No results for input(s): AMMONIA in the last 168 hours. Coagulation Profile: No results for input(s): INR, PROTIME in the last 168 hours. CBC: Recent Labs  Lab 04/29/21 1441 04/30/21 0534 05/01/21 0545 05/01/21 0943 05/02/21 0414  WBC 21.7* 17.6* 14.0* 13.6* 13.8*  NEUTROABS 18.2*  --   --   --   --   HGB 5.3* 8.0* 7.8* 8.9* 8.2*  HCT 19.4* 27.9* 27.5* 31.2* 30.0*  MCV 75.8* 79.3* 80.2 81.0 81.3  PLT 435* 357 366 295 385   Cardiac Enzymes: No results for input(s): CKTOTAL, CKMB, CKMBINDEX, TROPONINI in the last 168 hours. BNP: Invalid input(s): POCBNP CBG: Recent Labs  Lab 05/01/21 1604  GLUCAP 168*   HbA1C: No results for input(s): HGBA1C in the last 72 hours. Urine analysis:    Component Value Date/Time   COLORURINE STRAW (A) 06/08/2019 0103   APPEARANCEUR CLEAR 06/08/2019 0103   LABSPEC 1.005 06/08/2019 0103   PHURINE 7.0 06/08/2019 0103   GLUCOSEU NEGATIVE 06/08/2019 0103   HGBUR NEGATIVE 06/08/2019 0103   BILIRUBINUR small (A) 12/13/2020 1559   KETONESUR trace (5) (A) 12/13/2020 1559   KETONESUR NEGATIVE 06/08/2019 0103   PROTEINUR >=300 (A) 12/13/2020 1559   PROTEINUR NEGATIVE 06/08/2019 0103   UROBILINOGEN 2.0 (A) 12/13/2020 1559   UROBILINOGEN 0.2 06/08/2011 1400   NITRITE Positive (A) 12/13/2020 1559   NITRITE NEGATIVE 06/08/2019 0103   LEUKOCYTESUR Large (3+) (A) 12/13/2020 1559   LEUKOCYTESUR NEGATIVE 06/08/2019 0103   Sepsis Labs: @LABRCNTIP (procalcitonin:4,lacticidven:4) ) Recent Results (from the past 240 hour(s))  Resp Panel by RT-PCR (Flu A&B, Covid) Nasopharyngeal Swab     Status: None   Collection Time: 04/29/21   2:27 PM   Specimen: Nasopharyngeal Swab; Nasopharyngeal(NP) swabs in vial transport medium  Result Value Ref Range Status   SARS Coronavirus 2 by RT PCR NEGATIVE NEGATIVE Final    Comment: (NOTE) SARS-CoV-2 target nucleic acids are NOT DETECTED.  The SARS-CoV-2 RNA is generally detectable in upper respiratory specimens during the acute phase of infection. The lowest concentration of SARS-CoV-2 viral copies this assay can detect is 138 copies/mL. A negative result does not preclude SARS-Cov-2 infection and should not be used as the sole basis for treatment or other patient management decisions. A negative result may occur with  improper specimen collection/handling, submission of specimen other than nasopharyngeal swab, presence of viral mutation(s) within the areas targeted by this assay, and inadequate number of viral copies(<138 copies/mL). A negative result must be combined with  clinical observations, patient history, and epidemiological information. The expected result is Negative.  Fact Sheet for Patients:  EntrepreneurPulse.com.au  Fact Sheet for Healthcare Providers:  IncredibleEmployment.be  This test is no t yet approved or cleared by the [place] FDA and  has been authorized for detection and/or diagnosis of SARS-CoV-2 by FDA under an Emergency Use Authorization (EUA). This EUA will remain  in effect (meaning this test can be used) for the duration of the COVID-19 declaration under Section 564(b)(1) of the Act, 21 U.S.C.section 360bbb-3(b)(1), unless the authorization is terminated  or revoked sooner.       Influenza A by PCR NEGATIVE NEGATIVE Final   Influenza B by PCR NEGATIVE NEGATIVE Final    Comment: (NOTE) The Xpert Xpress SARS-CoV-2/FLU/RSV plus assay is intended as an aid in the diagnosis of influenza from Nasopharyngeal swab specimens and should not be used as a sole basis for treatment. Nasal washings and aspirates  are unacceptable for Xpert Xpress SARS-CoV-2/FLU/RSV testing.  Fact Sheet for Patients: EntrepreneurPulse.com.au  Fact Sheet for Healthcare Providers: IncredibleEmployment.be  This test is not yet approved or cleared by the [place] FDA and has been authorized for detection and/or diagnosis of SARS-CoV-2 by FDA under an Emergency Use Authorization (EUA). This EUA will remain in effect (meaning this test can be used) for the duration of the COVID-19 declaration under Section 564(b)(1) of the Act, 21 U.S.C. section 360bbb-3(b)(1), unless the authorization is terminated or revoked.  Performed at [facility], [address] [place], [place] [address]   MRSA Next Gen by PCR, Nasal     Status: None   Collection Time: 05/01/21  9:25 AM   Specimen: Nasal Mucosa; Nasal Swab  Result Value Ref Range Status   MRSA by PCR Next Gen NOT DETECTED NOT DETECTED Final    Comment: (NOTE) The GeneXpert MRSA Assay (FDA approved for NASAL specimens only), is one component of a comprehensive MRSA colonization surveillance program. It is not intended to diagnose MRSA infection nor to guide or monitor treatment for MRSA infections. Test performance is not FDA approved in patients less than 64 years old. Performed at [facility], [address], [place], [place] [address]      Scheduled Meds:  albuterol  2.5 mg Nebulization BID   arformoterol  15 mcg Nebulization BID   And   umeclidinium bromide  1 puff Inhalation Daily   Chlorhexidine Gluconate Cloth  6 each Topical Daily   diltiazem  30 mg Oral Q6H   DULoxetine  60 mg Oral BID   gabapentin  300 mg Oral TID   gemfibrozil  600 mg Oral BID AC   loratadine  10 mg Oral Daily   montelukast  10 mg Oral Daily   oxyCODONE  15 mg Oral Q12H   pantoprazole (PROTONIX) IV  40 mg Intravenous Q12H   topiramate  25 mg Oral Daily   valACYclovir  500 mg Oral BID   Continuous Infusions:  levofloxacin (LEVAQUIN) IV  Stopped (05/01/21 1824)    Procedures/Studies: CT Angio Chest Pulmonary Embolism (PE) W or WO Contrast  Result Date: 04/30/2021 CLINICAL DATA:  Productive cough. EXAM: CT ANGIOGRAPHY CHEST WITH CONTRAST TECHNIQUE: Multidetector CT imaging of the chest was performed using the standard protocol during bolus administration of intravenous contrast. Multiplanar CT image reconstructions and MIPs were obtained to evaluate the vascular anatomy. CONTRAST:  31m OMNIPAQUE IOHEXOL 350 MG/ML SOLN COMPARISON:  Chest CT dated 01/26/2021. Radiograph dated 04/29/2021. FINDINGS: Cardiovascular: Mild cardiomegaly. No pericardial effusion. The  thoracic aorta is unremarkable. The origins of the great vessels of the arch appear patent. Evaluation of the pulmonary arteries is limited due to respiratory motion artifact. No pulmonary artery embolus identified. Mediastinum/Nodes: Top-normal subcarinal lymph node measures 12 mm. The esophagus is grossly unremarkable. No mediastinal fluid collection. Lungs/Pleura: Background of emphysema. Bilateral lower lobe airspace consolidation, left greater right most consistent with pneumonia. Aspiration is not excluded clinical correlation is recommended. No pleural effusion or pneumothorax. The central airways are patent. Upper Abdomen: No acute abnormality. Musculoskeletal: No chest wall abnormality. No acute or significant osseous findings. Review of the MIP images confirms the above findings. IMPRESSION: 1. No CT evidence of pulmonary embolism. 2. Bilateral lower lobe airspace consolidation, left greater right most consistent with pneumonia. Aspiration is not excluded. 3. Aortic Atherosclerosis (ICD10-I70.0) and Emphysema (ICD10-J43.9). Electronically Signed   By: Anner Crete M.D.   On: 04/30/2021 01:51   DG Chest Port 1 View  Result Date: 04/29/2021 CLINICAL DATA:  Cough, dyspnea EXAM: PORTABLE CHEST 1 VIEW COMPARISON:  08/17/2020, 07/24/2020 FINDINGS: The lungs are symmetrically  well expanded. Trace reticular opacity has developed at the lung bases bilaterally, new since prior examination but less severe than that seen on prior examination of 07/24/2020, possibly representing trace interstitial pulmonary edema. No pneumothorax or pleural effusion. Cardiac size within normal limits. No acute bone abnormality. IMPRESSION: Trace bibasilar reticular infiltrate, possibly representing trace pulmonary edema. Electronically Signed   By: Fidela Salisbury M.D.   On: 04/29/2021 14:50   ECHOCARDIOGRAM COMPLETE  Result Date: 05/02/2021    ECHOCARDIOGRAM REPORT   Patient Name:   JAMYRAH SAUR Bohlman Date of Exam: 05/02/2021 Medical Rec #:  [id number]      Height:       69.0 in Accession #:    [id number]     Weight:       182.0 lb Date of Birth:  [birth year]      BSA:          1.985 m Patient Age:    32 years       BP:           107/49 mmHg Patient Gender: F              HR:           77 bpm. Exam Location:  [facility] Procedure: 2D Echo, Cardiac Doppler and Color Doppler Indications:    Atrial fibrillation  History:        Patient has prior history of Echocardiogram examinations, most                 recent 07/26/2020. CHF, COPD, Arrythmias:Atrial Fibrillation,                 Signs/Symptoms:Chest Pain and Resp, failure, anemia; Risk                 Factors:Current Smoker and Hypertension.  Sonographer:    Dustin Flock RDCS Referring Phys: [phone] Barbara Ahart  Sonographer Comments: Technically challenging study due to limited acoustic windows. Image acquisition challenging due to COPD. IMPRESSIONS  1. Left ventricular ejection fraction, by estimation, is 65 to 70%. The left ventricle has normal function. The left ventricle has no regional wall motion abnormalities. Left ventricular diastolic parameters are indeterminate.  2. Right ventricular systolic function is normal. The right ventricular size is normal. There is mildly elevated pulmonary artery systolic pressure.  3. The mitral valve is normal in  structure. Trivial mitral valve regurgitation. No evidence  of mitral stenosis.  4. The aortic valve was not well visualized. Aortic valve regurgitation is not visualized. No aortic stenosis is present.  5. The inferior vena cava is normal in size with greater than 50% respiratory variability, suggesting right atrial pressure of 3 mmHg. FINDINGS  Left Ventricle: Left ventricular ejection fraction, by estimation, is 65 to 70%. The left ventricle has normal function. The left ventricle has no regional wall motion abnormalities. The left ventricular internal cavity size was normal in size. There is  no left ventricular hypertrophy. Left ventricular diastolic parameters are indeterminate. Right Ventricle: The right ventricular size is normal. No increase in right ventricular wall thickness. Right ventricular systolic function is normal. There is mildly elevated pulmonary artery systolic pressure. The tricuspid regurgitant velocity is 3.15  m/s, and with an assumed right atrial pressure of 3 mmHg, the estimated right ventricular systolic pressure is 22.0 mmHg. Left Atrium: Left atrial size was normal in size. Right Atrium: Right atrial size was normal in size. Pericardium: There is no evidence of pericardial effusion. Mitral Valve: The mitral valve is normal in structure. Trivial mitral valve regurgitation. No evidence of mitral valve stenosis. Tricuspid Valve: The tricuspid valve is not well visualized. Tricuspid valve regurgitation is mild . No evidence of tricuspid stenosis. Aortic Valve: The aortic valve was not well visualized. Aortic valve regurgitation is not visualized. No aortic stenosis is present. Aortic valve mean gradient measures 8.9 mmHg. Aortic valve peak gradient measures 18.8 mmHg. Aortic valve area, by VTI measures 2.11 cm. Pulmonic Valve: The pulmonic valve was not well visualized. Pulmonic valve regurgitation is not visualized. No evidence of pulmonic stenosis. Aorta: The aortic root is normal in  size and structure. Venous: The inferior vena cava is normal in size with greater than 50% respiratory variability, suggesting right atrial pressure of 3 mmHg. IAS/Shunts: The interatrial septum was not well visualized.  LEFT VENTRICLE PLAX 2D LVIDd:         3.90 cm  Diastology LVIDs:         2.90 cm  LV e' medial:    7.77 cm/s LV PW:         1.10 cm  LV E/e' medial:  15.1 LV IVS:        1.00 cm  LV e' lateral:   12.30 cm/s LVOT diam:     2.00 cm  LV E/e' lateral: 9.5 LV SV:         96 LV SV Index:   48 LVOT Area:     3.14 cm  RIGHT VENTRICLE RV Basal diam:  2.60 cm RV S prime:     14.90 cm/s TAPSE (M-mode): 3.0 cm LEFT ATRIUM           Index       RIGHT ATRIUM           Index LA diam:      3.60 cm 1.81 cm/m  RA Area:     14.30 cm LA Vol (A2C): 35.2 ml 17.73 ml/m RA Volume:   36.20 ml  18.24 ml/m LA Vol (A4C): 31.5 ml 15.87 ml/m  AORTIC VALVE AV Area (Vmax):    2.44 cm AV Area (Vmean):   2.60 cm AV Area (VTI):     2.11 cm AV Vmax:           216.62 cm/s AV Vmean:          135.444 cm/s AV VTI:            0.453  m AV Peak Grad:      18.8 mmHg AV Mean Grad:      8.9 mmHg LVOT Vmax:         168.00 cm/s LVOT Vmean:        112.000 cm/s LVOT VTI:          0.305 m LVOT/AV VTI ratio: 0.67  AORTA Ao Root diam: 2.70 cm MITRAL VALVE                TRICUSPID VALVE MV Area (PHT): 4.29 cm     TR Peak grad:   39.7 mmHg MV Decel Time: 177 msec     TR Vmax:        315.00 cm/s MV E velocity: 117.00 cm/s MV A velocity: 100.00 cm/s  SHUNTS MV E/A ratio:  1.17         Systemic VTI:  0.30 m                             Systemic Diam: 2.00 cm Carlyle Dolly MD Electronically signed by Carlyle Dolly MD Signature Date/Time: 05/02/2021/11:12:49 AM    Final     Orson Eva, DO  [facility]  If 7PM-7AM, please contact night-coverage [email or web address] Password [email or web address] 05/02/2021, 1:55 PM   LOS: 2 days

## 2021-05-03 ENCOUNTER — Other Ambulatory Visit: Payer: Self-pay | Admitting: Gastroenterology

## 2021-05-03 ENCOUNTER — Telehealth: Payer: Self-pay | Admitting: Emergency Medicine

## 2021-05-03 ENCOUNTER — Encounter (HOSPITAL_COMMUNITY): Payer: Self-pay | Admitting: Internal Medicine

## 2021-05-03 ENCOUNTER — Telehealth: Payer: Self-pay | Admitting: Gastroenterology

## 2021-05-03 DIAGNOSIS — D649 Anemia, unspecified: Secondary | ICD-10-CM

## 2021-05-03 DIAGNOSIS — I4891 Unspecified atrial fibrillation: Secondary | ICD-10-CM | POA: Diagnosis not present

## 2021-05-03 DIAGNOSIS — J9611 Chronic respiratory failure with hypoxia: Secondary | ICD-10-CM | POA: Diagnosis not present

## 2021-05-03 DIAGNOSIS — M3390 Dermatopolymyositis, unspecified, organ involvement unspecified: Secondary | ICD-10-CM | POA: Diagnosis not present

## 2021-05-03 DIAGNOSIS — D508 Other iron deficiency anemias: Secondary | ICD-10-CM

## 2021-05-03 LAB — BASIC METABOLIC PANEL
Anion gap: 10 (ref 5–15)
BUN: 13 mg/dL (ref 8–23)
CO2: 25 mmol/L (ref 22–32)
Calcium: 9.1 mg/dL (ref 8.9–10.3)
Chloride: 99 mmol/L (ref 98–111)
Creatinine, Ser: 1.12 mg/dL — ABNORMAL HIGH (ref 0.44–1.00)
GFR, Estimated: 54 mL/min — ABNORMAL LOW (ref >60)
Glucose, Bld: 130 mg/dL — ABNORMAL HIGH (ref 70–99)
Potassium: 3.8 mmol/L (ref 3.5–5.1)
Sodium: 134 mmol/L — ABNORMAL LOW (ref 135–145)

## 2021-05-03 LAB — CBC
HCT: 28.2 % — ABNORMAL LOW (ref 36.0–46.0)
Hemoglobin: 7.8 g/dL — ABNORMAL LOW (ref 12.0–15.0)
MCH: 22.2 pg — ABNORMAL LOW (ref 26.0–34.0)
MCHC: 27.7 g/dL — ABNORMAL LOW (ref 30.0–36.0)
MCV: 80.1 fL (ref 80.0–100.0)
Platelets: 369 10*3/uL (ref 150–400)
RBC: 3.52 MIL/uL — ABNORMAL LOW (ref 3.87–5.11)
RDW: 21.9 % — ABNORMAL HIGH (ref 11.5–15.5)
WBC: 11.7 10*3/uL — ABNORMAL HIGH (ref 4.0–10.5)
nRBC: 0.2 % (ref 0.0–0.2)

## 2021-05-03 LAB — MAGNESIUM: Magnesium: 1.9 mg/dL (ref 1.7–2.4)

## 2021-05-03 MED ORDER — LEVOFLOXACIN 750 MG PO TABS
750.0000 mg | ORAL_TABLET | Freq: Once | ORAL | Status: AC
  Administered 2021-05-03: 750 mg via ORAL
  Filled 2021-05-03: qty 1

## 2021-05-03 MED ORDER — DILTIAZEM HCL ER COATED BEADS 120 MG PO CP24: 120.0000 mg | ORAL_CAPSULE | Freq: Every day | ORAL | 2 refills | Status: DC

## 2021-05-03 MED ORDER — PANTOPRAZOLE SODIUM 40 MG PO TBEC
40.0000 mg | DELAYED_RELEASE_TABLET | Freq: Two times a day (BID) | ORAL | Status: DC
  Administered 2021-05-03: 40 mg via ORAL
  Filled 2021-05-03: qty 1

## 2021-05-03 MED ORDER — PANTOPRAZOLE SODIUM 40 MG PO TBEC: 40.0000 mg | DELAYED_RELEASE_TABLET | Freq: Two times a day (BID) | ORAL | 1 refills | Status: DC

## 2021-05-03 MED ORDER — DILTIAZEM HCL ER COATED BEADS 120 MG PO CP24
120.0000 mg | ORAL_CAPSULE | Freq: Every day | ORAL | Status: DC
  Administered 2021-05-03: 120 mg via ORAL
  Filled 2021-05-03: qty 1

## 2021-05-03 MED ORDER — ASPIRIN 81 MG PO TBEC: 81.0000 mg | DELAYED_RELEASE_TABLET | Freq: Every day | ORAL | 12 refills | Status: DC

## 2021-05-03 NOTE — Telephone Encounter (Signed)
Labs are ordered and pt was made aware. Pt verbalized understanding and was given the address to [facility] with instructions to have labs done the end of the week.

## 2021-05-03 NOTE — Discharge Summary (Addendum)
Physician Discharge Summary  Kayla Ryan:[id number] DOB: 1955/09/30 DOA: 04/29/2021  PCP: Guadlupe Spanish, MD  Admit date: 04/29/2021 Discharge date: 05/03/2021  Admitted From:HOME Disposition:  Home   Recommendations for Outpatient Follow-up:  Follow up with PCP in 1-2 weeks Please obtain BMP/CBC in one week   Equipment/Devices: 3L Discharge Condition: Stable CODE STATUS:FULL Diet recommendation: Heart Healthy   Brief/Interim Summary: 65-year-old with history of COPD on chronic 3 L nasal cannula, dermatomyositis, Bell's palsy, Crohn's disease, HTN, CHF, A. fib comes to the hospital with difficulty breathing ongoing for about the last 5 days.  She also reported a weakness.  Chest x-ray showed by lateral reticular opacity with concerns of pulmonary edema.  Her hemoglobin was noted to be 5.3.  She was started on Levaquin, and GI team was consulted.  Discharge Diagnoses:  Symptomatic acute on chronic/Iron Deficiency Anemia -Symptomatic anemia.  Admission hemoglobin 5.3, baseline around 8-9.  FOBT negative.   -Status post 2 unit PRBC transfusion, hemoglobin stable after transfusion   -GI consulted -9/23 Colonoscopy--sigmoid diverticulosis, redundant colon -9/23 EGD--normal esophagus, stomach, duodenal bulb/duodenum -continue protonix bid -diet advanced which patient tolerated   Paroxysmal Atrial Fibrillation with RVR -not on AC due to recurrent anemia -discussed risks, benefits, alternatives of ASA 81 mg daily--pt agrees to take ASA -developed RVR am 9/24 -back into sinus evening 9/24>>remained in sinus -started diltiazem drip>>po diltiazem 9/25 -personally reviewed EKG--afib HR 140, nonspecific ST change- -Echo--EF 65-70%, no WMA, normal RV, trivial MR -TSH--3.918 -d/c home with diltiazem CD 120 mg daily   Multifocal pneumonia, bilateral lower lobes left greater than right  -Procalcitonin 2.84.   -Recently completed azithromycin course but will start patient on  Levaquin this admission. -finished 5 days levofloxacin 750 mg during hospitalization   Chest pain, atypical.   -Unlikely cardiac.   -CTA shows bilateral lower lobe opacities left greater than right; negative PR -troponins not consistent with ACS -Echo reassuring--see above   COPD, asthma on chronic respiratory failure on 3 L -stable. -As needed bronchodilators -continue Brovana, incruse -stable on 3L   Chronic CHF with preserved ejection fraction, 75%.  Stable and compensated.  BNP elevated at 448.  No sign of peripheral edema.  Last echo 2021 EF 7075%, normal LV diastolic parameters. -Holding of diuretics temporarily>>restart after d/c   Dermatomyositis, chronic pain syndrome, fibromyalgia, muscular dystrophy -Continue home long-acting opiates twice daily -continue gabapentin  Discharge Instructions   Allergies as of 05/03/2021       Reactions   Sulfa Antibiotics Shortness Of Breath, Swelling, Rash   Dilaudid [hydromorphone Hcl]    Makes me crazy   Iron    Throat starts closing up, tongue swells, severe headaches and backpain   Metronidazole Diarrhea, Nausea And Vomiting   Prednisone Other (See Comments)   angry angry   Cephalexin Diarrhea, Nausea And Vomiting   Methotrexate Derivatives Swelling, Rash   Morphine And Related Anxiety   "exreme mood swings"        Medication List     STOP taking these medications    acetaminophen 325 MG tablet Commonly known as: TYLENOL   albuterol 108 (90 Base) MCG/ACT inhaler Commonly known as: ProAir HFA   Coenzyme Q10 10 MG capsule   diclofenac sodium 1 % Gel Commonly known as: VOLTAREN   doxepin 10 MG capsule Commonly known as: SINEQUAN   doxycycline 100 MG capsule Commonly known as: VIBRAMYCIN   doxycycline 100 MG tablet Commonly known as: VIBRA-TABS   furosemide 40 MG tablet Commonly  known as: Lasix   ipratropium-albuterol 0.5-2.5 (3) MG/3ML Soln Commonly known as: DUONEB   isosorbide mononitrate 30 MG  24 hr tablet Commonly known as: IMDUR   levothyroxine 25 MCG tablet Commonly known as: SYNTHROID   Melatonin 10 MG Tbcr   multivitamin capsule   omeprazole 40 MG capsule Commonly known as: PRILOSEC   topiramate 100 MG tablet Commonly known as: TOPAMAX   topiramate 25 MG tablet Commonly known as: TOPAMAX   vitamin B-12 1000 MCG tablet Commonly known as: CYANOCOBALAMIN   Vitamin D (Ergocalciferol) 1.25 MG (50000 UNIT) Caps capsule Commonly known as: DRISDOL       TAKE these medications    aspirin 81 MG EC tablet Commonly known as: Aspirin 81 Take 1 tablet (81 mg total) by mouth daily. Swallow whole.   Belsomra 20 MG Tabs Generic drug: Suvorexant Take 1 tablet by mouth at bedtime as needed.   cetirizine 10 MG tablet Commonly known as: ZYRTEC Take 10 mg by mouth daily.   diltiazem 120 MG 24 hr capsule Commonly known as: Cardizem CD Take 1 capsule (120 mg total) by mouth daily.   DULoxetine 60 MG capsule Commonly known as: CYMBALTA Take 1 capsule (60 mg total) by mouth 2 (two) times daily.   folic acid 1 MG tablet Commonly known as: FOLVITE Take 1 tablet (1 mg total) by mouth daily.   gabapentin 300 MG capsule Commonly known as: NEURONTIN Take 300 mg by mouth 3 (three) times daily. What changed: Another medication with the same name was removed. Continue taking this medication, and follow the directions you see here.   gemfibrozil 600 MG tablet Commonly known as: LOPID Take 600 mg by mouth 2 (two) times daily before a meal.   LORazepam 0.5 MG tablet Commonly known as: ATIVAN Take 1 tablet (0.5 mg total) by mouth 3 (three) times daily as needed for anxiety.   magnesium hydroxide 400 MG/5ML suspension Commonly known as: MILK OF MAGNESIA Take 15 mLs by mouth at bedtime.   montelukast 10 MG tablet Commonly known as: SINGULAIR Take 10 mg by mouth daily.   Nitrostat 0.4 MG SL tablet Generic drug: nitroGLYCERIN Place 0.4 mg under the tongue every 15  (fifteen) minutes as needed for chest pain.   ondansetron 4 MG disintegrating tablet Commonly known as: ZOFRAN-ODT Take 2 mg by mouth every 6 (six) hours as needed.   oxyCODONE 5 MG immediate release tablet Commonly known as: Oxy IR/ROXICODONE Take 1 tablet (5 mg total) by mouth every 4 (four) hours as needed for moderate pain or severe pain.   pantoprazole 40 MG tablet Commonly known as: PROTONIX Take 1 tablet (40 mg total) by mouth 2 (two) times daily.   Stiolto Respimat 2.5-2.5 MCG/ACT Aers Generic drug: Tiotropium Bromide-Olodaterol Inhale 2 puffs into the lungs daily.   valACYclovir 500 MG tablet Commonly known as: VALTREX Take 500 mg by mouth 2 (two) times daily.   Xtampza ER 18 MG C12a Generic drug: oxyCODONE ER Take 1 capsule by mouth 2 (two) times daily.        Allergies  Allergen Reactions   Sulfa Antibiotics Shortness Of Breath, Swelling and Rash   Dilaudid [Hydromorphone Hcl]     Makes me crazy   Iron     Throat starts closing up, tongue swells, severe headaches and backpain   Metronidazole Diarrhea and Nausea And Vomiting   Prednisone Other (See Comments)    angry angry    Cephalexin Diarrhea and Nausea And Vomiting   Methotrexate  Derivatives Swelling and Rash   Morphine And Related Anxiety    "exreme mood swings"    Consultations: GI   Procedures/Studies: CT Angio Chest Pulmonary Embolism (PE) W or WO Contrast  Result Date: 04/30/2021 CLINICAL DATA:  Productive cough. EXAM: CT ANGIOGRAPHY CHEST WITH CONTRAST TECHNIQUE: Multidetector CT imaging of the chest was performed using the standard protocol during bolus administration of intravenous contrast. Multiplanar CT image reconstructions and MIPs were obtained to evaluate the vascular anatomy. CONTRAST:  12m OMNIPAQUE IOHEXOL 350 MG/ML SOLN COMPARISON:  Chest CT dated 01/26/2021. Radiograph dated 04/29/2021. FINDINGS: Cardiovascular: Mild cardiomegaly. No pericardial effusion. The thoracic aorta is  unremarkable. The origins of the great vessels of the arch appear patent. Evaluation of the pulmonary arteries is limited due to respiratory motion artifact. No pulmonary artery embolus identified. Mediastinum/Nodes: Top-normal subcarinal lymph node measures 12 mm. The esophagus is grossly unremarkable. No mediastinal fluid collection. Lungs/Pleura: Background of emphysema. Bilateral lower lobe airspace consolidation, left greater right most consistent with pneumonia. Aspiration is not excluded clinical correlation is recommended. No pleural effusion or pneumothorax. The central airways are patent. Upper Abdomen: No acute abnormality. Musculoskeletal: No chest wall abnormality. No acute or significant osseous findings. Review of the MIP images confirms the above findings. IMPRESSION: 1. No CT evidence of pulmonary embolism. 2. Bilateral lower lobe airspace consolidation, left greater right most consistent with pneumonia. Aspiration is not excluded. 3. Aortic Atherosclerosis (ICD10-I70.0) and Emphysema (ICD10-J43.9). Electronically Signed   By: AAnner CreteM.D.   On: 04/30/2021 01:51   DG Chest Port 1 View  Result Date: 04/29/2021 CLINICAL DATA:  Cough, dyspnea EXAM: PORTABLE CHEST 1 VIEW COMPARISON:  08/17/2020, 07/24/2020 FINDINGS: The lungs are symmetrically well expanded. Trace reticular opacity has developed at the lung bases bilaterally, new since prior examination but less severe than that seen on prior examination of 07/24/2020, possibly representing trace interstitial pulmonary edema. No pneumothorax or pleural effusion. Cardiac size within normal limits. No acute bone abnormality. IMPRESSION: Trace bibasilar reticular infiltrate, possibly representing trace pulmonary edema. Electronically Signed   By: AFidela SalisburyM.D.   On: 04/29/2021 14:50   ECHOCARDIOGRAM COMPLETE  Result Date: 05/02/2021    ECHOCARDIOGRAM REPORT   Patient Name:   KRAELLE CHAMBERSGAMBLE Date of Exam: 05/02/2021 Medical Rec #:   [id number]     Height:       69.0 in Accession #:    [id number]    Weight:       182.0 lb Date of Birth:  [birth year]     BSA:          1.985 m Patient Age:    89years       BP:           107/49 mmHg Patient Gender: F              HR:           77 bpm. [facility], Cardiac Doppler and Color Doppler Indications:    Atrial fibrillation  History:        Patient has prior history of Echocardiogram examinations, most                 recent 07/26/2020. CHF, COPD, Arrythmias:Atrial Fibrillation,                 Signs/Symptoms:Chest Pain and Resp, failure, anemia; Risk  Factors:Current Smoker and Hypertension.  Sonographer:    Dustin Flock RDCS Referring Phys: [phone] Nadia Viar  Sonographer Comments: Technically challenging study due to limited acoustic windows. Image acquisition challenging due to COPD. IMPRESSIONS  1. Left ventricular ejection fraction, by estimation, is 65 to 70%. The left ventricle has normal function. The left ventricle has no regional wall motion abnormalities. Left ventricular diastolic parameters are indeterminate.  2. Right ventricular systolic function is normal. The right ventricular size is normal. There is mildly elevated pulmonary artery systolic pressure.  3. The mitral valve is normal in structure. Trivial mitral valve regurgitation. No evidence of mitral stenosis.  4. The aortic valve was not well visualized. Aortic valve regurgitation is not visualized. No aortic stenosis is present.  5. The inferior vena cava is normal in size with greater than 50% respiratory variability, suggesting right atrial pressure of 3 mmHg. FINDINGS  Left Ventricle: Left ventricular ejection fraction, by estimation, is 65 to 70%. The left ventricle has normal function. The left ventricle has no regional wall motion abnormalities. The left ventricular internal cavity size was normal in size. There is  no left ventricular hypertrophy. Left ventricular diastolic  parameters are indeterminate. Right Ventricle: The right ventricular size is normal. No increase in right ventricular wall thickness. Right ventricular systolic function is normal. There is mildly elevated pulmonary artery systolic pressure. The tricuspid regurgitant velocity is 3.15  m/s, and with an assumed right atrial pressure of 3 mmHg, the estimated right ventricular systolic pressure is 93.8 mmHg. Left Atrium: Left atrial size was normal in size. Right Atrium: Right atrial size was normal in size. Pericardium: There is no evidence of pericardial effusion. Mitral Valve: The mitral valve is normal in structure. Trivial mitral valve regurgitation. No evidence of mitral valve stenosis. Tricuspid Valve: The tricuspid valve is not well visualized. Tricuspid valve regurgitation is mild . No evidence of tricuspid stenosis. Aortic Valve: The aortic valve was not well visualized. Aortic valve regurgitation is not visualized. No aortic stenosis is present. Aortic valve mean gradient measures 8.9 mmHg. Aortic valve peak gradient measures 18.8 mmHg. Aortic valve area, by VTI measures 2.11 cm. Pulmonic Valve: The pulmonic valve was not well visualized. Pulmonic valve regurgitation is not visualized. No evidence of pulmonic stenosis. Aorta: The aortic root is normal in size and structure. Venous: The inferior vena cava is normal in size with greater than 50% respiratory variability, suggesting right atrial pressure of 3 mmHg. IAS/Shunts: The interatrial septum was not well visualized.  LEFT VENTRICLE PLAX 2D LVIDd:         3.90 cm  Diastology LVIDs:         2.90 cm  LV e' medial:    7.77 cm/s LV PW:         1.10 cm  LV E/e' medial:  15.1 LV IVS:        1.00 cm  LV e' lateral:   12.30 cm/s LVOT diam:     2.00 cm  LV E/e' lateral: 9.5 LV SV:         96 LV SV Index:   48 LVOT Area:     3.14 cm  RIGHT VENTRICLE RV Basal diam:  2.60 cm RV S prime:     14.90 cm/s TAPSE (M-mode): 3.0 cm LEFT ATRIUM           Index       RIGHT  ATRIUM           Index LA diam:  3.60 cm 1.81 cm/m  RA Area:     14.30 cm LA Vol (A2C): 35.2 ml 17.73 ml/m RA Volume:   36.20 ml  18.24 ml/m LA Vol (A4C): 31.5 ml 15.87 ml/m  AORTIC VALVE AV Area (Vmax):    2.44 cm AV Area (Vmean):   2.60 cm AV Area (VTI):     2.11 cm AV Vmax:           216.62 cm/s AV Vmean:          135.444 cm/s AV VTI:            0.453 m AV Peak Grad:      18.8 mmHg AV Mean Grad:      8.9 mmHg LVOT Vmax:         168.00 cm/s LVOT Vmean:        112.000 cm/s LVOT VTI:          0.305 m LVOT/AV VTI ratio: 0.67  AORTA Ao Root diam: 2.70 cm MITRAL VALVE                TRICUSPID VALVE MV Area (PHT): 4.29 cm     TR Peak grad:   39.7 mmHg MV Decel Time: 177 msec     TR Vmax:        315.00 cm/s MV E velocity: 117.00 cm/s MV A velocity: 100.00 cm/s  SHUNTS MV E/A ratio:  1.17         Systemic VTI:  0.30 m                             Systemic Diam: 2.00 cm Carlyle Dolly MD Electronically signed by Carlyle Dolly MD Signature Date/Time: 05/02/2021/11:12:49 AM    Final         Discharge Exam: Vitals:   05/03/21 0739 05/03/21 0911  BP:  (!) 168/54  Pulse:    Resp:    Temp:    SpO2: 97%    Vitals:   05/03/21 0700 05/03/21 0718 05/03/21 0739 05/03/21 0911  BP:  (!) 160/62  (!) 168/54  Pulse:  80    Resp:  14    Temp: 98.6 F (37 C)     TempSrc: Oral     SpO2:  96% 97%   Weight:      Height:        General: Pt is alert, awake, not in acute distress Cardiovascular: RRR, S1/S2 +, no rubs, no gallops Respiratory: bibasilar rales. No wheeze Abdominal: Soft, NT, ND, bowel sounds + Extremities: no edema, no cyanosis   The results of significant diagnostics from this hospitalization (including imaging, microbiology, ancillary and laboratory) are listed below for reference.    Significant Diagnostic Studies: CT Angio Chest Pulmonary Embolism (PE) W or WO Contrast  Result Date: 04/30/2021 CLINICAL DATA:  Productive cough. EXAM: CT ANGIOGRAPHY CHEST WITH CONTRAST  TECHNIQUE: Multidetector CT imaging of the chest was performed using the standard protocol during bolus administration of intravenous contrast. Multiplanar CT image reconstructions and MIPs were obtained to evaluate the vascular anatomy. CONTRAST:  53m OMNIPAQUE IOHEXOL 350 MG/ML SOLN COMPARISON:  Chest CT dated 01/26/2021. Radiograph dated 04/29/2021. FINDINGS: Cardiovascular: Mild cardiomegaly. No pericardial effusion. The thoracic aorta is unremarkable. The origins of the great vessels of the arch appear patent. Evaluation of the pulmonary arteries is limited due to respiratory motion artifact. No pulmonary artery embolus identified. Mediastinum/Nodes: Top-normal subcarinal lymph node measures 12 mm. The esophagus is  grossly unremarkable. No mediastinal fluid collection. Lungs/Pleura: Background of emphysema. Bilateral lower lobe airspace consolidation, left greater right most consistent with pneumonia. Aspiration is not excluded clinical correlation is recommended. No pleural effusion or pneumothorax. The central airways are patent. Upper Abdomen: No acute abnormality. Musculoskeletal: No chest wall abnormality. No acute or significant osseous findings. Review of the MIP images confirms the above findings. IMPRESSION: 1. No CT evidence of pulmonary embolism. 2. Bilateral lower lobe airspace consolidation, left greater right most consistent with pneumonia. Aspiration is not excluded. 3. Aortic Atherosclerosis (ICD10-I70.0) and Emphysema (ICD10-J43.9). Electronically Signed   By: Anner Crete M.D.   On: 04/30/2021 01:51   DG Chest Port 1 View  Result Date: 04/29/2021 CLINICAL DATA:  Cough, dyspnea EXAM: PORTABLE CHEST 1 VIEW COMPARISON:  08/17/2020, 07/24/2020 FINDINGS: The lungs are symmetrically well expanded. Trace reticular opacity has developed at the lung bases bilaterally, new since prior examination but less severe than that seen on prior examination of 07/24/2020, possibly representing trace  interstitial pulmonary edema. No pneumothorax or pleural effusion. Cardiac size within normal limits. No acute bone abnormality. IMPRESSION: Trace bibasilar reticular infiltrate, possibly representing trace pulmonary edema. Electronically Signed   By: Fidela Salisbury M.D.   On: 04/29/2021 14:50   ECHOCARDIOGRAM COMPLETE  Result Date: 05/02/2021    ECHOCARDIOGRAM REPORT   Patient Name:   SHARNISE BLOUGH Pals Date of Exam: 05/02/2021 Medical Rec #:  [id number]      Height:       69.0 in Accession #:    [id number]     Weight:       182.0 lb Date of Birth:  Apr 13, 1956      BSA:          1.985 m Patient Age:    88 years       BP:           107/49 mmHg Patient Gender: F              HR:           77 bpm. [facility] Procedure: 2D Echo, Cardiac Doppler and Color Doppler Indications:    Atrial fibrillation  History:        Patient has prior history of Echocardiogram examinations, most                 recent 07/26/2020. CHF, COPD, Arrythmias:Atrial Fibrillation,                 Signs/Symptoms:Chest Pain and Resp, failure, anemia; Risk                 Factors:Current Smoker and Hypertension.  Sonographer:    Dustin Flock RDCS Referring Phys: [phone] Khristine Verno  Sonographer Comments: Technically challenging study due to limited acoustic windows. Image acquisition challenging due to COPD. IMPRESSIONS  1. Left ventricular ejection fraction, by estimation, is 65 to 70%. The left ventricle has normal function. The left ventricle has no regional wall motion abnormalities. Left ventricular diastolic parameters are indeterminate.  2. Right ventricular systolic function is normal. The right ventricular size is normal. There is mildly elevated pulmonary artery systolic pressure.  3. The mitral valve is normal in structure. Trivial mitral valve regurgitation. No evidence of mitral stenosis.  4. The aortic valve was not well visualized. Aortic valve regurgitation is not visualized. No aortic stenosis is present.  5. The  inferior vena cava is normal in size with greater than 50% respiratory variability, suggesting right atrial pressure  of 3 mmHg. FINDINGS  Left Ventricle: Left ventricular ejection fraction, by estimation, is 65 to 70%. The left ventricle has normal function. The left ventricle has no regional wall motion abnormalities. The left ventricular internal cavity size was normal in size. There is  no left ventricular hypertrophy. Left ventricular diastolic parameters are indeterminate. Right Ventricle: The right ventricular size is normal. No increase in right ventricular wall thickness. Right ventricular systolic function is normal. There is mildly elevated pulmonary artery systolic pressure. The tricuspid regurgitant velocity is 3.15  m/s, and with an assumed right atrial pressure of 3 mmHg, the estimated right ventricular systolic pressure is 76.2 mmHg. Left Atrium: Left atrial size was normal in size. Right Atrium: Right atrial size was normal in size. Pericardium: There is no evidence of pericardial effusion. Mitral Valve: The mitral valve is normal in structure. Trivial mitral valve regurgitation. No evidence of mitral valve stenosis. Tricuspid Valve: The tricuspid valve is not well visualized. Tricuspid valve regurgitation is mild . No evidence of tricuspid stenosis. Aortic Valve: The aortic valve was not well visualized. Aortic valve regurgitation is not visualized. No aortic stenosis is present. Aortic valve mean gradient measures 8.9 mmHg. Aortic valve peak gradient measures 18.8 mmHg. Aortic valve area, by VTI measures 2.11 cm. Pulmonic Valve: The pulmonic valve was not well visualized. Pulmonic valve regurgitation is not visualized. No evidence of pulmonic stenosis. Aorta: The aortic root is normal in size and structure. Venous: The inferior vena cava is normal in size with greater than 50% respiratory variability, suggesting right atrial pressure of 3 mmHg. IAS/Shunts: The interatrial septum was not well  visualized.  LEFT VENTRICLE PLAX 2D LVIDd:         3.90 cm  Diastology LVIDs:         2.90 cm  LV e' medial:    7.77 cm/s LV PW:         1.10 cm  LV E/e' medial:  15.1 LV IVS:        1.00 cm  LV e' lateral:   12.30 cm/s LVOT diam:     2.00 cm  LV E/e' lateral: 9.5 LV SV:         96 LV SV Index:   48 LVOT Area:     3.14 cm  RIGHT VENTRICLE RV Basal diam:  2.60 cm RV S prime:     14.90 cm/s TAPSE (M-mode): 3.0 cm LEFT ATRIUM           Index       RIGHT ATRIUM           Index LA diam:      3.60 cm 1.81 cm/m  RA Area:     14.30 cm LA Vol (A2C): 35.2 ml 17.73 ml/m RA Volume:   36.20 ml  18.24 ml/m LA Vol (A4C): 31.5 ml 15.87 ml/m  AORTIC VALVE AV Area (Vmax):    2.44 cm AV Area (Vmean):   2.60 cm AV Area (VTI):     2.11 cm AV Vmax:           216.62 cm/s AV Vmean:          135.444 cm/s AV VTI:            0.453 m AV Peak Grad:      18.8 mmHg AV Mean Grad:      8.9 mmHg LVOT Vmax:         168.00 cm/s LVOT Vmean:        112.000  cm/s LVOT VTI:          0.305 m LVOT/AV VTI ratio: 0.67  AORTA Ao Root diam: 2.70 cm MITRAL VALVE                TRICUSPID VALVE MV Area (PHT): 4.29 cm     TR Peak grad:   39.7 mmHg MV Decel Time: 177 msec     TR Vmax:        315.00 cm/s MV E velocity: 117.00 cm/s MV A velocity: 100.00 cm/s  SHUNTS MV E/A ratio:  1.17         Systemic VTI:  0.30 m                             Systemic Diam: 2.00 cm Carlyle Dolly MD Electronically signed by Carlyle Dolly MD Signature Date/Time: 05/02/2021/11:12:49 AM    Final     Microbiology: Recent Results (from the past 240 hour(s))  Resp Panel by RT-PCR (Flu A&B, Covid) Nasopharyngeal Swab     Status: None   Collection Time: 04/29/21  2:27 PM   Specimen: Nasopharyngeal Swab; Nasopharyngeal(NP) swabs in vial transport medium  Result Value Ref Range Status   SARS Coronavirus 2 by RT PCR NEGATIVE NEGATIVE Final    Comment: (NOTE) SARS-CoV-2 target nucleic acids are NOT DETECTED.  The SARS-CoV-2 RNA is generally detectable in upper  respiratory specimens during the acute phase of infection. The lowest concentration of SARS-CoV-2 viral copies this assay can detect is 138 copies/mL. A negative result does not preclude SARS-Cov-2 infection and should not be used as the sole basis for treatment or other patient management decisions. A negative result may occur with  improper specimen collection/handling, submission of specimen other than nasopharyngeal swab, presence of viral mutation(s) within the areas targeted by this assay, and inadequate number of viral copies(<138 copies/mL). A negative result must be combined with clinical observations, patient history, and epidemiological information. The expected result is Negative.  Fact Sheet for Patients:  EntrepreneurPulse.com.au  Fact Sheet for Healthcare Providers:  IncredibleEmployment.be  This test is no t yet approved or cleared by the [place] FDA and  has been authorized for detection and/or diagnosis of SARS-CoV-2 by FDA under an Emergency Use Authorization (EUA). This EUA will remain  in effect (meaning this test can be used) for the duration of the COVID-19 declaration under Section 564(b)(1) of the Act, 21 U.S.C.section 360bbb-3(b)(1), unless the authorization is terminated  or revoked sooner.       Influenza A by PCR NEGATIVE NEGATIVE Final   Influenza B by PCR NEGATIVE NEGATIVE Final    Comment: (NOTE) The Xpert Xpress SARS-CoV-2/FLU/RSV plus assay is intended as an aid in the diagnosis of influenza from Nasopharyngeal swab specimens and should not be used as a sole basis for treatment. Nasal washings and aspirates are unacceptable for Xpert Xpress SARS-CoV-2/FLU/RSV testing.  Fact Sheet for Patients: EntrepreneurPulse.com.au  Fact Sheet for Healthcare Providers: IncredibleEmployment.be  This test is not yet approved or cleared by the [place] FDA and has been  authorized for detection and/or diagnosis of SARS-CoV-2 by FDA under an Emergency Use Authorization (EUA). This EUA will remain in effect (meaning this test can be used) for the duration of the COVID-19 declaration under Section 564(b)(1) of the Act, 21 U.S.C. section 360bbb-3(b)(1), unless the authorization is terminated or revoked.  Performed at [facility], [address] [place], [place] [address]   MRSA Next Gen  by PCR, Nasal     Status: None   Collection Time: 05/01/21  9:25 AM   Specimen: Nasal Mucosa; Nasal Swab  Result Value Ref Range Status   MRSA by PCR Next Gen NOT DETECTED NOT DETECTED Final    Comment: (NOTE) The GeneXpert MRSA Assay (FDA approved for NASAL specimens only), is one component of a comprehensive MRSA colonization surveillance program. It is not intended to diagnose MRSA infection nor to guide or monitor treatment for MRSA infections. Test performance is not FDA approved in patients less than 45 years old. Performed at [facility], [address], [place], [place] [address]      Labs: Basic Metabolic Panel: Recent Labs  Lab 04/29/21 1441 04/30/21 0534 05/01/21 0545 05/02/21 0414 05/03/21 0401  NA 135 137 135 133* 134*  K 4.0 4.1 3.5 3.5 3.8  CL 100 102 100 98 99  CO2 26 24 25 27 25   GLUCOSE 161* 109* 94 134* 130*  BUN 23 19 15 15 13   CREATININE 1.06* 0.89 0.93 1.04* 1.12*  CALCIUM 8.3* 8.6* 8.5* 8.8* 9.1  MG  --   --  1.7 2.1 1.9   Liver Function Tests: No results for input(s): AST, ALT, ALKPHOS, BILITOT, PROT, ALBUMIN in the last 168 hours. No results for input(s): LIPASE, AMYLASE in the last 168 hours. No results for input(s): AMMONIA in the last 168 hours. CBC: Recent Labs  Lab 04/29/21 1441 04/30/21 0534 05/01/21 0545 05/01/21 0943 05/02/21 0414 05/03/21 0401  WBC 21.7* 17.6* 14.0* 13.6* 13.8* 11.7*  NEUTROABS 18.2*  --   --   --   --   --   HGB 5.3* 8.0* 7.8* 8.9* 8.2* 7.8*  HCT 19.4* 27.9* 27.5* 31.2* 30.0* 28.2*  MCV 75.8*  79.3* 80.2 81.0 81.3 80.1  PLT 435* 357 366 295 385 369   Cardiac Enzymes: No results for input(s): CKTOTAL, CKMB, CKMBINDEX, TROPONINI in the last 168 hours. BNP: Invalid input(s): POCBNP CBG: Recent Labs  Lab 05/01/21 1604  GLUCAP 168*    Time coordinating discharge:  36 minutes  Signed:  Orson Eva, DO [facility] Pager: [phone] 05/03/2021, 9:21 AM

## 2021-05-03 NOTE — Telephone Encounter (Signed)
Lmom for pt to return my call.  

## 2021-05-03 NOTE — Telephone Encounter (Signed)
[facility] clinical pool:  Patient discharged today after hospitalization for acute on chronic anemia. Primary GI is at [facility]. Can we please help facilitate follow-up for her? (Dr. Darlyne Russian)  Tammy: can we have patient complete CBC end of week? thanks

## 2021-05-03 NOTE — Telephone Encounter (Signed)
Notes faxed to Dr. Marin Comment.

## 2021-05-03 NOTE — Care Management Important Message (Signed)
Important Message  Patient Details  Name: Kayla Ryan MRN: [id number] Date of Birth: 05-Jul-1956   Medicare Important Message Given:  Yes     Tommy Medal 05/03/2021, 10:24 AM

## 2021-05-03 NOTE — Progress Notes (Signed)
Spoke with patient's friendDerry Skill on the patient's cell phone. Patient declines EMS transport home. Patient on chronic 3L oxygen and does not have personal concentrator with her. She [place] 2 miles from the hospital and nobody can enter her apartment to obtain her concentrator. She has previously transported without her oxygen and feels safe in doing so again. She has her inhaler which she states she can use if she needs it during the transport. Discharge instructions reviewed for all medication details and education and questions answered.

## 2021-05-03 NOTE — Telephone Encounter (Signed)
Attempted to call pt but unable to reach. Left message for her to return call. 

## 2021-05-03 NOTE — Addendum Note (Signed)
Addended by: Cheron Every on: 05/03/2021 02:09 PM   Modules accepted: Orders

## 2021-05-04 ENCOUNTER — Ambulatory Visit: Payer: Medicare Other | Admitting: Emergency Medicine

## 2021-05-11 ENCOUNTER — Ambulatory Visit (INDEPENDENT_AMBULATORY_CARE_PROVIDER_SITE_OTHER): Payer: Medicare Other | Admitting: Emergency Medicine

## 2021-05-11 ENCOUNTER — Encounter: Payer: Self-pay | Admitting: Emergency Medicine

## 2021-05-11 ENCOUNTER — Telehealth: Payer: Self-pay | Admitting: Emergency Medicine

## 2021-05-11 ENCOUNTER — Other Ambulatory Visit: Payer: Self-pay

## 2021-05-11 DIAGNOSIS — J449 Chronic obstructive pulmonary disease, unspecified: Secondary | ICD-10-CM

## 2021-05-11 NOTE — Progress Notes (Signed)
Virtual Visit via Telephone Note  I connected with Kayla Ryan on 05/11/21 at  2:15 PM EDT by telephone and verified that I am speaking with the correct person using two identifiers.  Location: Patient: home Provider: office   I discussed the limitations, risks, security and privacy concerns of performing an evaluation and management service by telephone and the availability of in person appointments. I also discussed with the patient that there may be a patient responsible charge related to this service. The patient expressed understanding and agreed to proceed.   History of Present Illness: 65 year old former smoker with history of fibromyalgia, inflammatory myopathy, hyperlipidemia, Crohn's disease and IBS, chronic anemia, COPD.  She has GERD and allergic rhinitis as well that contribute to chronic cough.  I last saw her June/2021 for progressive dyspnea.  PFT showed very severe obstruction and a diffusion defect.  Most recent bronchodilator regimen Stiolto, albuterol/DuoNeb as needed.  He is on oxygen at 2 L/min.  She was admitted to [facility] 9/23 with dyspnea for 5 days, found to have a hemoglobin 5.3.     Observations/Objective: EGD 9/23 showed normal esophagus, stomach, duodenum.  She is continued on Protonix. Had esophageal dilation as well.  CT-PA 04/29/2021 reviewed by me showed new bibasilar left greater than right groundglass opacities consistent with pneumonia.  She was admitted for 5 days. Today she reports that she is coughing up brown phlegm. She is on Stiolto, is also using DuoNeb prn but rarely due to associated anxiety. She is using her o2 at 3L/min. She c/o some upper chest discomfort, has a lot of cough when supine or when eating. Still intermittently aspirates.  On singulair, zyrtec.   Assessment and Plan: Severe COPD Recent bibasilar pneumonia, question intermittent aspiration Allergic rhinitis Hypoxemic respiratory failure  -Continue Stiolto, DuoNeb as needed,  albuterol as needed -Continue oxygen at 2-3 L/min -Continue Singulair, Zyrtec -Start Mucinex 600 mg twice daily -Plan to repeat her CT scan of the chest without contrast in early November to look for resolution of her bibasilar infiltrates   Follow Up Instructions: Follow-up in November after the CT so that we can review together.   I discussed the assessment and treatment plan with the patient. The patient was provided an opportunity to ask questions and all were answered. The patient agreed with the plan and demonstrated an understanding of the instructions.   The patient was advised to call back or seek an in-person evaluation if the symptoms worsen or if the condition fails to improve as anticipated.  I provided 21 minutes of non-face-to-face time during this encounter.   Collene Gobble, MD

## 2021-05-11 NOTE — Telephone Encounter (Signed)
RB please advise if you are ok with pt changing her appt today from in office to televisit.  She does not have transportation today to come in at 215.  Thanks

## 2021-05-11 NOTE — Addendum Note (Signed)
Addended by: Gavin Potters R on: 05/11/2021 03:52 PM   Modules accepted: Orders

## 2021-05-11 NOTE — Telephone Encounter (Signed)
Spoke with Dr. Lamonte Sakai who ok's televisit. Front dest will call pt and confirm. Nothing else needed at this time.

## 2021-05-13 NOTE — Telephone Encounter (Signed)
Patient had an appt 05/11/21.   Nothing further needed at this time.

## 2021-05-14 ENCOUNTER — Other Ambulatory Visit: Payer: Self-pay | Admitting: Pulmonary Disease

## 2021-05-14 ENCOUNTER — Telehealth: Payer: Self-pay | Admitting: Emergency Medicine

## 2021-05-14 MED ORDER — DOXYCYCLINE HYCLATE 100 MG PO TABS: 100.0000 mg | ORAL_TABLET | Freq: Two times a day (BID) | ORAL | 0 refills | Status: DC

## 2021-05-14 NOTE — Telephone Encounter (Signed)
Prescription for doxycycline sent into pharmacy

## 2021-05-14 NOTE — Telephone Encounter (Signed)
Called and spoke with patient. She had a televisit with RB on 05/11/21 and was under the impression that he was going to send in antibiotics for her since she was coughing up brown phlegm. I went over the AVS with her and it did not mention any antibiotics. She has started the Mucinex twice daily. She denied any fevers or any increased SOB. She is concerned about the brown phlegm lasting this long.   She is requesting antibiotics.   Pharmacy is [facility] in [place].   AO, can you please advise since RB is not available? Thanks!

## 2021-05-14 NOTE — Telephone Encounter (Signed)
Called and spoke with patient. She is aware of the doxy and verbalized understanding.   Nothing further needed at time of call.

## 2021-05-14 NOTE — Progress Notes (Signed)
Prescription for doxycycline sent into pharmacy

## 2021-05-21 DIAGNOSIS — G4700 Insomnia, unspecified: Secondary | ICD-10-CM | POA: Diagnosis not present

## 2021-05-21 DIAGNOSIS — Z79899 Other long term (current) drug therapy: Secondary | ICD-10-CM | POA: Diagnosis not present

## 2021-05-21 DIAGNOSIS — M5440 Lumbago with sciatica, unspecified side: Secondary | ICD-10-CM | POA: Diagnosis not present

## 2021-05-21 DIAGNOSIS — M5136 Other intervertebral disc degeneration, lumbar region: Secondary | ICD-10-CM | POA: Diagnosis not present

## 2021-05-26 DIAGNOSIS — J449 Chronic obstructive pulmonary disease, unspecified: Secondary | ICD-10-CM | POA: Diagnosis not present

## 2021-05-27 ENCOUNTER — Telehealth: Payer: Self-pay | Admitting: Emergency Medicine

## 2021-05-27 NOTE — Telephone Encounter (Addendum)
I scheduled CT for 11/7 at 2:00.  Spoke to pt & gave her appt info.  Nothing further needed.

## 2021-06-06 ENCOUNTER — Emergency Department (HOSPITAL_COMMUNITY): Payer: Medicare Other

## 2021-06-06 ENCOUNTER — Encounter (HOSPITAL_COMMUNITY): Payer: Self-pay | Admitting: Emergency Medicine

## 2021-06-06 ENCOUNTER — Emergency Department (HOSPITAL_COMMUNITY)
Admission: EM | Admit: 2021-06-06 | Discharge: 2021-06-06 | Disposition: A | Discharge status: Home / Self Care | Payer: Medicare Other | Attending: Emergency Medicine | Admitting: Emergency Medicine

## 2021-06-06 ENCOUNTER — Other Ambulatory Visit: Payer: Self-pay

## 2021-06-06 DIAGNOSIS — Z743 Need for continuous supervision: Secondary | ICD-10-CM | POA: Diagnosis not present

## 2021-06-06 DIAGNOSIS — E039 Hypothyroidism, unspecified: Secondary | ICD-10-CM | POA: Diagnosis not present

## 2021-06-06 DIAGNOSIS — Z7982 Long term (current) use of aspirin: Secondary | ICD-10-CM | POA: Insufficient documentation

## 2021-06-06 DIAGNOSIS — J45909 Unspecified asthma, uncomplicated: Secondary | ICD-10-CM | POA: Insufficient documentation

## 2021-06-06 DIAGNOSIS — Z043 Encounter for examination and observation following other accident: Secondary | ICD-10-CM | POA: Diagnosis not present

## 2021-06-06 DIAGNOSIS — E041 Nontoxic single thyroid nodule: Secondary | ICD-10-CM | POA: Diagnosis not present

## 2021-06-06 DIAGNOSIS — Z7951 Long term (current) use of inhaled steroids: Secondary | ICD-10-CM | POA: Diagnosis not present

## 2021-06-06 DIAGNOSIS — J449 Chronic obstructive pulmonary disease, unspecified: Secondary | ICD-10-CM | POA: Diagnosis not present

## 2021-06-06 DIAGNOSIS — S8392XA Sprain of unspecified site of left knee, initial encounter: Secondary | ICD-10-CM | POA: Insufficient documentation

## 2021-06-06 DIAGNOSIS — W01198A Fall on same level from slipping, tripping and stumbling with subsequent striking against other object, initial encounter: Secondary | ICD-10-CM | POA: Insufficient documentation

## 2021-06-06 DIAGNOSIS — M25569 Pain in unspecified knee: Secondary | ICD-10-CM | POA: Diagnosis not present

## 2021-06-06 DIAGNOSIS — M25521 Pain in right elbow: Secondary | ICD-10-CM | POA: Diagnosis not present

## 2021-06-06 DIAGNOSIS — I2510 Atherosclerotic heart disease of native coronary artery without angina pectoris: Secondary | ICD-10-CM | POA: Insufficient documentation

## 2021-06-06 DIAGNOSIS — S161XXA Strain of muscle, fascia and tendon at neck level, initial encounter: Secondary | ICD-10-CM

## 2021-06-06 DIAGNOSIS — Z79899 Other long term (current) drug therapy: Secondary | ICD-10-CM | POA: Insufficient documentation

## 2021-06-06 DIAGNOSIS — S060X0A Concussion without loss of consciousness, initial encounter: Secondary | ICD-10-CM | POA: Diagnosis not present

## 2021-06-06 DIAGNOSIS — I509 Heart failure, unspecified: Secondary | ICD-10-CM | POA: Insufficient documentation

## 2021-06-06 DIAGNOSIS — Z87891 Personal history of nicotine dependence: Secondary | ICD-10-CM | POA: Insufficient documentation

## 2021-06-06 DIAGNOSIS — R6889 Other general symptoms and signs: Secondary | ICD-10-CM | POA: Diagnosis not present

## 2021-06-06 DIAGNOSIS — M79602 Pain in left arm: Secondary | ICD-10-CM | POA: Diagnosis not present

## 2021-06-06 DIAGNOSIS — R5381 Other malaise: Secondary | ICD-10-CM | POA: Diagnosis not present

## 2021-06-06 DIAGNOSIS — S53401A Unspecified sprain of right elbow, initial encounter: Secondary | ICD-10-CM | POA: Diagnosis not present

## 2021-06-06 DIAGNOSIS — I110 Hypertensive heart disease with heart failure: Secondary | ICD-10-CM | POA: Insufficient documentation

## 2021-06-06 DIAGNOSIS — S8992XA Unspecified injury of left lower leg, initial encounter: Secondary | ICD-10-CM | POA: Diagnosis not present

## 2021-06-06 DIAGNOSIS — M25562 Pain in left knee: Secondary | ICD-10-CM | POA: Diagnosis not present

## 2021-06-06 DIAGNOSIS — S0990XA Unspecified injury of head, initial encounter: Secondary | ICD-10-CM | POA: Diagnosis not present

## 2021-06-06 DIAGNOSIS — S80919A Unspecified superficial injury of unspecified knee, initial encounter: Secondary | ICD-10-CM | POA: Diagnosis not present

## 2021-06-06 DIAGNOSIS — W19XXXA Unspecified fall, initial encounter: Secondary | ICD-10-CM

## 2021-06-06 DIAGNOSIS — Z955 Presence of coronary angioplasty implant and graft: Secondary | ICD-10-CM | POA: Insufficient documentation

## 2021-06-06 DIAGNOSIS — R079 Chest pain, unspecified: Secondary | ICD-10-CM | POA: Diagnosis not present

## 2021-06-06 MED ORDER — ACETAMINOPHEN 500 MG PO TABS
1000.0000 mg | ORAL_TABLET | Freq: Once | ORAL | Status: AC
  Administered 2021-06-06: 1000 mg via ORAL
  Filled 2021-06-06: qty 2

## 2021-06-06 MED ORDER — OXYCODONE HCL 5 MG PO TABS
5.0000 mg | ORAL_TABLET | Freq: Once | ORAL | Status: AC
  Administered 2021-06-06: 5 mg via ORAL
  Filled 2021-06-06: qty 1

## 2021-06-06 NOTE — ED Notes (Signed)
Dr Christy Gentles at bedside to assess patient.

## 2021-06-06 NOTE — ED Notes (Signed)
Pt transferred to xray.

## 2021-06-06 NOTE — ED Notes (Signed)
Patient transported to X-ray 

## 2021-06-06 NOTE — ED Triage Notes (Signed)
Pt brought in by EMS after she tripped over her oxygen cord and hit her head. Pt denies LOC. Pt has swelling and bruising to L forehead and L knee. Also has swelling and bruising to R elbow. C/o pain to all these areas as well as to her neck from hyper-extension during fall and L upper arm. Pt tearful during triage. States she is depressed d/t recent illness and that she "wished her daddy was still alive".

## 2021-06-06 NOTE — ED Provider Notes (Signed)
[facility] Provider Note   CSN: [id number] Arrival date & time: 06/06/21  9449     History Chief Complaint  Patient presents with   Kayla Ryan is a 65 y.o. female.  The history is provided by the patient.  Fall This is a new problem. Episode onset: just prior to arrival. Associated symptoms include headaches. Pertinent negatives include no chest pain and no abdominal pain. The symptoms are aggravated by walking. The symptoms are relieved by rest.  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  Patient reports she tripped on oxygen cord and hit her head.  No LOC.  She also reports after the fall she sustained injury in the left upper arm, her neck, right elbow and left knee. No new chest pain or abdominal pain.  Patient reports she has felt somewhat depressed due to her health issues recently but she denies SI.   She is not on anticoagulation Past Medical History:  Diagnosis Date   Anemia    Arthritis    Asthma    Chest pain    Chronic bronchitis    Congestive heart failure (CHF) (HCC)    COPD (chronic obstructive pulmonary disease) ([place])    Crohn disease ([place])    Emphysema    Emphysema of lung (HCC)    Fibromyalgia    Herpes    Hyperlipidemia    IBS (irritable bowel syndrome)    Kidney failure    Migraines    Muscular dystrophy ([place])    Neck pain    Plantar fasciitis    Polymyositis ([place])    Scoliosis     Patient Active Problem List   Diagnosis Date Noted   Community acquired pneumonia of left lower lobe of lung    Gastroenteritis 04/30/2021   Chronic respiratory failure with hypoxia ([place]) 01/28/2021   AKI (acute kidney injury) ([place]) 08/18/2020   Hypothyroidism 07/26/2020   Acute respiratory failure with hypoxia ([place]) 07/26/2020   B12 and Folate deficiency 07/26/2020   Folate deficiency anemia--B12 AND Folate deficiency 07/26/2020   Atrial fibrillation with RVR ([place])  07/25/2020   Acute on chronic anemia 07/24/2020   Leukocytosis 07/24/2020   Pulmonary nodules 07/24/2020   Acute CHF (congestive heart failure) ([place]) 07/24/2020   Dyspnea 01/31/2020   Anemia 01/03/2020   Syncope 06/08/2019   Hypokalemia 06/08/2019   Trigger finger, acquired 11/03/2017   Chronic obstructive pulmonary disease/Emphysema 08/23/2017   Chronic pain 08/23/2017   History of Crohn's disease 08/23/2017   Myopathy 08/23/2017   Right shoulder injury, subsequent encounter 08/23/2017   Primary insomnia 02/01/2017   Esophageal dysphagia 10/06/2016   Essential hypertension 07/17/2016   Iron deficiency anemia 03/25/2016   History of Bell's palsy 03/03/2015   Nuclear sclerosis of both eyes 03/03/2015   Allergic rhinitis 05/18/2014   Anxiety 05/18/2014   Arthritis 05/18/2014   Asthma 05/18/2014   Chronic constipation 05/18/2014   Dermatomyositis ([place]) 05/18/2014   Migraine without status migrainosus, not intractable 05/18/2014   Rectocele 05/18/2014   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   Peripheral neuropathy 05/18/2014   Chest pain 10/30/2013   Hyperlipidemia 10/30/2013   Carotid artery disease ([place]) 10/30/2013    Past Surgical History:  Procedure Laterality Date   ABDOMINAL HYSTERECTOMY     bone spur     CHOLECYSTECTOMY     COLONOSCOPY WITH PROPOFOL N/A 04/30/2021   Procedure: COLONOSCOPY WITH PROPOFOL;  Surgeon: Daneil Dolin,  MD;  [facility];  Service: Endoscopy;  Laterality: N/A;   ESOPHAGEAL DILATION N/A 04/30/2021   Procedure: ESOPHAGEAL DILATION;  Surgeon: Daneil Dolin, MD;  Location: [facility];  Service: Endoscopy;  Laterality: N/A;   ESOPHAGOGASTRODUODENOSCOPY (EGD) WITH PROPOFOL N/A 04/30/2021   Procedure: ESOPHAGOGASTRODUODENOSCOPY (EGD) WITH PROPOFOL;  Surgeon: Daneil Dolin, MD;  Location: [facility];  Service: Endoscopy;  Laterality: N/A;   HERNIA REPAIR     LEFT HEART CATHETERIZATION WITH CORONARY ANGIOGRAM N/A  11/07/2013   Procedure: LEFT HEART CATHETERIZATION WITH CORONARY ANGIOGRAM;  Surgeon: Lorretta Harp, MD;  Location: [facility] CATH LAB;  Service: Cardiovascular;  Laterality: N/A;   MASTECTOMY PARTIAL / LUMPECTOMY     OOPHORECTOMY     ROTATOR CUFF REPAIR       OB History   No obstetric history on file.     Family History  Problem Relation Age of Onset   Lung cancer Mother    COPD Father    Lung cancer Father    Lymphoma Father    Anxiety disorder Sister    Depression Brother     Social History   Tobacco Use   Smoking status: Former    Packs/day: 1.00    Years: 49.00    Pack years: 49.00    Types: Cigarettes    Start date: 08/08/1966    Quit date: 08/31/2005    Years since quitting: 15.7   Smokeless tobacco: Never  Vaping Use   Vaping Use: Never used  Substance Use Topics   Alcohol use: No   Drug use: No    Comment: used marijuana in teens    Home Medications Prior to Admission medications   Medication Sig Start Date End Date Taking? Authorizing Provider  aspirin (ASPIRIN 81) 81 MG EC tablet Take 1 tablet (81 mg total) by mouth daily. Swallow whole. 05/03/21   Orson Eva, MD  BELSOMRA 20 MG TABS Take 1 tablet by mouth at bedtime as needed. 04/13/21   [provider]  cetirizine (ZYRTEC) 10 MG tablet Take 10 mg by mouth daily.    [provider]  diltiazem (CARDIZEM CD) 120 MG 24 hr capsule Take 1 capsule (120 mg total) by mouth daily. 05/03/21 05/03/22  Orson Eva, MD  doxycycline (VIBRA-TABS) 100 MG tablet Take 1 tablet (100 mg total) by mouth 2 (two) times daily. 05/14/21   Olalere, Ernesto Rutherford, MD  DULoxetine (CYMBALTA) 60 MG capsule Take 1 capsule (60 mg total) by mouth 2 (two) times daily. 07/26/20   Roxan Hockey, MD  folic acid (FOLVITE) 1 MG tablet Take 1 tablet (1 mg total) by mouth daily. 07/27/20   Roxan Hockey, MD  gabapentin (NEURONTIN) 300 MG capsule Take 300 mg by mouth 3 (three) times daily.    [provider]  gemfibrozil (LOPID)  600 MG tablet Take 600 mg by mouth 2 (two) times daily before a meal.    [provider]  LORazepam (ATIVAN) 0.5 MG tablet Take 1 tablet (0.5 mg total) by mouth 3 (three) times daily as needed for anxiety. 08/19/20   Manuella Ghazi, Pratik D, DO  magnesium hydroxide (MILK OF MAGNESIA) 400 MG/5ML suspension Take 15 mLs by mouth at bedtime.    [provider]  montelukast (SINGULAIR) 10 MG tablet Take 10 mg by mouth daily. 02/23/21   [provider]  NITROSTAT 0.4 MG SL tablet Place 0.4 mg under the tongue every 15 (fifteen) minutes as needed for chest pain.  09/22/13  [provider]  ondansetron (ZOFRAN-ODT) 4 MG disintegrating tablet Take 2 mg by mouth every 6 (six) hours as needed. 12/18/20   [provider]  oxyCODONE (OXY IR/ROXICODONE) 5 MG immediate release tablet Take 1 tablet (5 mg total) by mouth every 4 (four) hours as needed for moderate pain or severe pain. 08/19/20   Manuella Ghazi, Pratik D, DO  pantoprazole (PROTONIX) 40 MG tablet Take 1 tablet (40 mg total) by mouth 2 (two) times daily. 05/03/21   Orson Eva, MD  Tiotropium Bromide-Olodaterol (STIOLTO RESPIMAT) 2.5-2.5 MCG/ACT AERS Inhale 2 puffs into the lungs daily. 12/07/20   Collene Gobble, MD  valACYclovir (VALTREX) 500 MG tablet Take 500 mg by mouth 2 (two) times daily.    [provider]  XTAMPZA ER 18 MG C12A Take 1 capsule by mouth 2 (two) times daily. 08/19/20   Manuella Ghazi, Pratik D, DO    Allergies    Sulfa antibiotics, Dilaudid [hydromorphone hcl], Iron, Metronidazole, Prednisone, Cephalexin, Methotrexate derivatives, and Morphine and related  Review of Systems   Review of Systems  Constitutional:  Negative for fever.  Cardiovascular:  Negative for chest pain.  Gastrointestinal:  Negative for abdominal pain.  Musculoskeletal:  Positive for arthralgias, myalgias and neck pain.  Neurological:  Positive for headaches.  Psychiatric/Behavioral:  Negative for suicidal ideas.   All other systems  reviewed and are negative.  Physical Exam Updated Vital Signs BP (!) 188/75   Pulse 98   Temp 99.4 F (37.4 C)   Resp 20   Ht 1.753 m (5' 9" )   Wt 84.8 kg   SpO2 98%   BMI 27.62 kg/m   Physical Exam CONSTITUTIONAL: Elderly, no acute distress HEAD: Bruising to scalp, no other signs of trauma EYES: EOMI/PERRL ENMT: Mucous membranes moist, no visible facial trauma NECK: Cervical collar in place SPINE/BACK: Diffuse cervical spine tenderness No thoracic or lumbar tenderness No bruising/crepitance/stepoffs noted to spine CV: S1/S2 noted, no murmurs/rubs/gallops noted LUNGS: Lungs are clear to auscultation bilaterally, no apparent distress ABDOMEN: soft, nontender, no bruising NEURO: Pt is awake/alert/appropriate, moves all extremitiesx4.  No facial droop.  GCS 15 No arm drift. Equal power with hand grip, wrist flex/extension, elbow flex/extension, and equal power with shoulder abduction/adduction EXTREMITIES: pulses normal/equal, full ROM Tenderness noted to left upper arm.  Tenderness noted to left knee.  Bruising noted to the right elbow Pelvis stable SKIN: warm, color normal PSYCH: Mildly anxious  ED Results / Procedures / Treatments   Labs (all labs ordered are listed, but only abnormal results are displayed) Labs Reviewed - No data to display  EKG None  Radiology DG Chest 2 View  Result Date: 06/06/2021 CLINICAL DATA:  Fall.  Knee pain EXAM: CHEST - 2 VIEW COMPARISON:  Chest CT 04/29/2021 FINDINGS: Normal cardiac silhouette. Small focus of airspace disease in the medial RIGHT lower lobe similar to comparison CT. No pneumothorax. No pulmonary contusion. no pleural fluid. No fracture. IMPRESSION: 1. No evidence of thoracic trauma. 2. Persistent/chronic/recurrent airspace disease in the RIGHT lower lobe. Electronically Signed   By: Suzy Bouchard M.D.   On: 06/06/2021 07:06   DG Elbow Complete Right  Result Date: 06/06/2021 CLINICAL DATA:  Fall last night with right  elbow pain EXAM: RIGHT ELBOW - COMPLETE 3+ VIEW COMPARISON:  10/31/2019 FINDINGS: There is no evidence of fracture, dislocation, or joint effusion. There is no evidence of arthropathy or other focal bone abnormality. Soft tissues are unremarkable. IMPRESSION: Negative. Electronically Signed   By: Gilford Silvius.D.  On: 06/06/2021 07:02   CT Head Wo Contrast  Result Date: 06/06/2021 CLINICAL DATA:  Fall with head injury.  Initial encounter. EXAM: CT HEAD WITHOUT CONTRAST CT CERVICAL SPINE WITHOUT CONTRAST TECHNIQUE: Multidetector CT imaging of the head and cervical spine was performed following the standard protocol without intravenous contrast. Multiplanar CT image reconstructions of the cervical spine were also generated. COMPARISON:  12/12/2020 head CT FINDINGS: CT HEAD FINDINGS Brain: No evidence of acute infarction, hemorrhage, hydrocephalus, extra-axial collection or mass lesion/mass effect. Vascular: No hyperdense vessel or unexpected calcification. Skull: Left-sided scalp swelling.  No acute fracture Sinuses/Orbits: No evidence of injury CT CERVICAL SPINE FINDINGS Alignment: No traumatic malalignment. Degenerative reversal of cervical lordosis.Mild C4-5 anterolisthesis, degenerative appearing. Skull base and vertebrae: No acute fracture. Soft tissues and spinal canal: No prevertebral fluid or swelling. No visible canal hematoma. 17 mm right thyroid nodule which has enlarged since 2021. Accentuated supraclavicular fat and nodes, benign appearing given stability. Disc levels: Asymmetric left facet osteoarthritis with moderate to bulky spurring. Upper chest: Negative IMPRESSION: 1. No evidence of acute intracranial or cervical spine injury. 2. Left scalp contusion without calvarial fracture. 3. 17 mm right thyroid nodule which has enlarged since 2021. Recommend thyroid US (ref: J Am Coll Radiol. 2015 Feb;12(2): 143-50). Electronically Signed   By: Jorje Guild M.D.   On: 06/06/2021 06:59   CT  Cervical Spine Wo Contrast  Result Date: 06/06/2021 CLINICAL DATA:  Fall with head injury.  Initial encounter. EXAM: CT HEAD WITHOUT CONTRAST CT CERVICAL SPINE WITHOUT CONTRAST TECHNIQUE: Multidetector CT imaging of the head and cervical spine was performed following the standard protocol without intravenous contrast. Multiplanar CT image reconstructions of the cervical spine were also generated. COMPARISON:  12/12/2020 head CT FINDINGS: CT HEAD FINDINGS Brain: No evidence of acute infarction, hemorrhage, hydrocephalus, extra-axial collection or mass lesion/mass effect. Vascular: No hyperdense vessel or unexpected calcification. Skull: Left-sided scalp swelling.  No acute fracture Sinuses/Orbits: No evidence of injury CT CERVICAL SPINE FINDINGS Alignment: No traumatic malalignment. Degenerative reversal of cervical lordosis.Mild C4-5 anterolisthesis, degenerative appearing. Skull base and vertebrae: No acute fracture. Soft tissues and spinal canal: No prevertebral fluid or swelling. No visible canal hematoma. 17 mm right thyroid nodule which has enlarged since 2021. Accentuated supraclavicular fat and nodes, benign appearing given stability. Disc levels: Asymmetric left facet osteoarthritis with moderate to bulky spurring. Upper chest: Negative IMPRESSION: 1. No evidence of acute intracranial or cervical spine injury. 2. Left scalp contusion without calvarial fracture. 3. 17 mm right thyroid nodule which has enlarged since 2021. Recommend thyroid US (ref: J Am Coll Radiol. 2015 Feb;12(2): 143-50). Electronically Signed   By: Jorje Guild M.D.   On: 06/06/2021 06:59   DG Knee Complete 4 Views Left  Result Date: 06/06/2021 CLINICAL DATA:  Fall with left knee pain.  Initial encounter. EXAM: LEFT KNEE - COMPLETE 4 VIEW COMPARISON:  None. FINDINGS: No evidence of fracture, dislocation, or joint effusion. No evidence of arthropathy or other focal bone abnormality. Soft tissues are unremarkable. IMPRESSION:  Negative. Electronically Signed   By: Jorje Guild M.D.   On: 06/06/2021 07:01   DG Humerus Left  Result Date: 06/06/2021 CLINICAL DATA:  Fall last night with left arm pain. Initial encounter. EXAM: LEFT HUMERUS - 2+ VIEW COMPARISON:  10/31/2019 FINDINGS: [place] at the [facility] joint. No acute fracture or subluxation. Negative soft tissues. IMPRESSION: No acute finding. Electronically Signed   By: Jorje Guild M.D.   On: 06/06/2021 07:00    Procedures Procedures  Medications Ordered in ED Medications  oxyCODONE (Oxy IR/ROXICODONE) immediate release tablet 5 mg (5 mg Oral Given 06/06/21 5102)    ED Course  I have reviewed the triage vital signs and the nursing notes.  Pertinent  imaging results that were available during my care of the patient were reviewed by me and considered in my medical decision making (see chart for details).    MDM Rules/Calculators/A&P                           Patient presents after mechanical fall while tripping off oxygen cord. She had pain throughout her body.  Patient has a history of polymyositis and has chronic pain most days.  New sites of injury of her head/neck/left arm/left knee and right elbow No signs of acute traumatic injury.  No signs of any brain injury or spinal injury There is evidence of a thyroid nodule that recommends ultrasound.  This was discussed with patient and need for follow-up to evaluate for can Patient has been ambulatory. She mention feeling depressed due to medical conditions, but flatly denies SI Patient will be discharged home Final Clinical Impression(s) / ED Diagnoses Final diagnoses:  Fall, initial encounter  Concussion without loss of consciousness, initial encounter  Thyroid nodule  Strain of neck muscle, initial encounter  Elbow sprain, right, initial encounter  Sprain of left knee, unspecified ligament, initial encounter    Rx / DC Orders ED Discharge Orders     None        Ripley Fraise,  MD 06/06/21 [phone]

## 2021-06-06 NOTE — ED Notes (Signed)
Pt was told she was about ready to leave so pt took upon her self to get up too leave and fell in the floor hitting her knee and elbow.

## 2021-06-06 NOTE — Discharge Instructions (Signed)
Please call your doctor for a follow-up ultrasound of your thyroid in the next month to evaluate cancer

## 2021-06-06 NOTE — ED Provider Notes (Signed)
Right after I informed patient that her x-rays were negative, she decided to get up and slid out of bed and reinjured her right elbow and left knee.  These will be reimaged.  No head injury. Signed out to Dr Matilde Sprang at shift change   Ripley Fraise, MD 06/06/21 [phone]

## 2021-06-06 NOTE — ED Notes (Signed)
[employer] called to set up transportation at this time.

## 2021-06-07 NOTE — ED Notes (Signed)
Pt refused vitals, stated she was ready to go and she got onto ems stretcher. Pt wouldn't let me put BP cuff on her arm.

## 2021-06-14 ENCOUNTER — Ambulatory Visit (HOSPITAL_COMMUNITY): Admission: RE | Admit: 2021-06-14 | Payer: Medicare Other | Source: Ambulatory Visit

## 2021-06-16 ENCOUNTER — Ambulatory Visit: Payer: Medicare Other | Admitting: Emergency Medicine

## 2021-06-18 DIAGNOSIS — E039 Hypothyroidism, unspecified: Secondary | ICD-10-CM | POA: Diagnosis not present

## 2021-06-18 DIAGNOSIS — M129 Arthropathy, unspecified: Secondary | ICD-10-CM | POA: Diagnosis not present

## 2021-06-18 DIAGNOSIS — N183 Chronic kidney disease, stage 3 unspecified: Secondary | ICD-10-CM | POA: Diagnosis not present

## 2021-06-18 DIAGNOSIS — E559 Vitamin D deficiency, unspecified: Secondary | ICD-10-CM | POA: Diagnosis not present

## 2021-06-18 DIAGNOSIS — M5440 Lumbago with sciatica, unspecified side: Secondary | ICD-10-CM | POA: Diagnosis not present

## 2021-06-18 DIAGNOSIS — D539 Nutritional anemia, unspecified: Secondary | ICD-10-CM | POA: Diagnosis not present

## 2021-06-18 DIAGNOSIS — J449 Chronic obstructive pulmonary disease, unspecified: Secondary | ICD-10-CM | POA: Diagnosis not present

## 2021-06-18 DIAGNOSIS — Z79899 Other long term (current) drug therapy: Secondary | ICD-10-CM | POA: Diagnosis not present

## 2021-06-18 DIAGNOSIS — E7800 Pure hypercholesterolemia, unspecified: Secondary | ICD-10-CM | POA: Diagnosis not present

## 2021-06-22 ENCOUNTER — Ambulatory Visit: Payer: Medicare Other | Admitting: Primary Care

## 2021-06-23 ENCOUNTER — Encounter (HOSPITAL_COMMUNITY): Payer: Self-pay

## 2021-06-23 ENCOUNTER — Observation Stay (HOSPITAL_COMMUNITY): Payer: Medicare Other

## 2021-06-23 ENCOUNTER — Emergency Department (HOSPITAL_COMMUNITY): Payer: Medicare Other

## 2021-06-23 ENCOUNTER — Other Ambulatory Visit: Payer: Self-pay

## 2021-06-23 ENCOUNTER — Inpatient Hospital Stay (HOSPITAL_COMMUNITY)
Admission: EM | Admit: 2021-06-23 | Discharge: 2021-06-27 | DRG: 386 | Disposition: A | Discharge status: Home Health | Payer: Medicare Other | Attending: Internal Medicine | Admitting: Internal Medicine

## 2021-06-23 DIAGNOSIS — J439 Emphysema, unspecified: Secondary | ICD-10-CM | POA: Diagnosis present

## 2021-06-23 DIAGNOSIS — Z825 Family history of asthma and other chronic lower respiratory diseases: Secondary | ICD-10-CM | POA: Diagnosis not present

## 2021-06-23 DIAGNOSIS — Z888 Allergy status to other drugs, medicaments and biological substances status: Secondary | ICD-10-CM | POA: Diagnosis not present

## 2021-06-23 DIAGNOSIS — M3390 Dermatopolymyositis, unspecified, organ involvement unspecified: Secondary | ICD-10-CM | POA: Diagnosis not present

## 2021-06-23 DIAGNOSIS — I10 Essential (primary) hypertension: Secondary | ICD-10-CM | POA: Diagnosis present

## 2021-06-23 DIAGNOSIS — Z7982 Long term (current) use of aspirin: Secondary | ICD-10-CM

## 2021-06-23 DIAGNOSIS — R069 Unspecified abnormalities of breathing: Secondary | ICD-10-CM | POA: Diagnosis not present

## 2021-06-23 DIAGNOSIS — K921 Melena: Secondary | ICD-10-CM | POA: Diagnosis not present

## 2021-06-23 DIAGNOSIS — J449 Chronic obstructive pulmonary disease, unspecified: Secondary | ICD-10-CM | POA: Diagnosis not present

## 2021-06-23 DIAGNOSIS — D62 Acute posthemorrhagic anemia: Secondary | ICD-10-CM | POA: Diagnosis not present

## 2021-06-23 DIAGNOSIS — F32A Depression, unspecified: Secondary | ICD-10-CM | POA: Diagnosis not present

## 2021-06-23 DIAGNOSIS — Z882 Allergy status to sulfonamides status: Secondary | ICD-10-CM

## 2021-06-23 DIAGNOSIS — D500 Iron deficiency anemia secondary to blood loss (chronic): Secondary | ICD-10-CM | POA: Diagnosis not present

## 2021-06-23 DIAGNOSIS — D509 Iron deficiency anemia, unspecified: Secondary | ICD-10-CM | POA: Diagnosis present

## 2021-06-23 DIAGNOSIS — T189XXA Foreign body of alimentary tract, part unspecified, initial encounter: Secondary | ICD-10-CM

## 2021-06-23 DIAGNOSIS — R609 Edema, unspecified: Secondary | ICD-10-CM | POA: Diagnosis not present

## 2021-06-23 DIAGNOSIS — J9611 Chronic respiratory failure with hypoxia: Secondary | ICD-10-CM | POA: Diagnosis present

## 2021-06-23 DIAGNOSIS — R5381 Other malaise: Secondary | ICD-10-CM | POA: Diagnosis not present

## 2021-06-23 DIAGNOSIS — R109 Unspecified abdominal pain: Secondary | ICD-10-CM

## 2021-06-23 DIAGNOSIS — Z20822 Contact with and (suspected) exposure to covid-19: Secondary | ICD-10-CM | POA: Diagnosis present

## 2021-06-23 DIAGNOSIS — Z87891 Personal history of nicotine dependence: Secondary | ICD-10-CM

## 2021-06-23 DIAGNOSIS — R140 Abdominal distension (gaseous): Secondary | ICD-10-CM

## 2021-06-23 DIAGNOSIS — J45909 Unspecified asthma, uncomplicated: Secondary | ICD-10-CM | POA: Diagnosis not present

## 2021-06-23 DIAGNOSIS — R0602 Shortness of breath: Secondary | ICD-10-CM | POA: Diagnosis not present

## 2021-06-23 DIAGNOSIS — K50911 Crohn's disease, unspecified, with rectal bleeding: Principal | ICD-10-CM | POA: Diagnosis present

## 2021-06-23 DIAGNOSIS — Z9981 Dependence on supplemental oxygen: Secondary | ICD-10-CM

## 2021-06-23 DIAGNOSIS — I480 Paroxysmal atrial fibrillation: Secondary | ICD-10-CM | POA: Diagnosis present

## 2021-06-23 DIAGNOSIS — M419 Scoliosis, unspecified: Secondary | ICD-10-CM | POA: Diagnosis present

## 2021-06-23 DIAGNOSIS — M3313 Other dermatomyositis without myopathy: Secondary | ICD-10-CM | POA: Diagnosis present

## 2021-06-23 DIAGNOSIS — G8929 Other chronic pain: Secondary | ICD-10-CM | POA: Diagnosis present

## 2021-06-23 DIAGNOSIS — D649 Anemia, unspecified: Secondary | ICD-10-CM

## 2021-06-23 DIAGNOSIS — Z885 Allergy status to narcotic agent status: Secondary | ICD-10-CM | POA: Diagnosis not present

## 2021-06-23 DIAGNOSIS — M797 Fibromyalgia: Secondary | ICD-10-CM | POA: Diagnosis not present

## 2021-06-23 DIAGNOSIS — E785 Hyperlipidemia, unspecified: Secondary | ICD-10-CM | POA: Diagnosis not present

## 2021-06-23 DIAGNOSIS — Z79899 Other long term (current) drug therapy: Secondary | ICD-10-CM

## 2021-06-23 DIAGNOSIS — K567 Ileus, unspecified: Secondary | ICD-10-CM

## 2021-06-23 DIAGNOSIS — F419 Anxiety disorder, unspecified: Secondary | ICD-10-CM | POA: Diagnosis present

## 2021-06-23 DIAGNOSIS — Z743 Need for continuous supervision: Secondary | ICD-10-CM | POA: Diagnosis not present

## 2021-06-23 DIAGNOSIS — M7989 Other specified soft tissue disorders: Secondary | ICD-10-CM | POA: Diagnosis not present

## 2021-06-23 DIAGNOSIS — G7100 Muscular dystrophy, unspecified: Secondary | ICD-10-CM | POA: Diagnosis present

## 2021-06-23 LAB — COMPREHENSIVE METABOLIC PANEL
ALT: 14 U/L (ref 0–44)
AST: 25 U/L (ref 15–41)
Albumin: 4.1 g/dL (ref 3.5–5.0)
Alkaline Phosphatase: 96 U/L (ref 38–126)
Anion gap: 12 (ref 5–15)
BUN: 8 mg/dL (ref 8–23)
CO2: 26 mmol/L (ref 22–32)
Calcium: 8.8 mg/dL — ABNORMAL LOW (ref 8.9–10.3)
Chloride: 97 mmol/L — ABNORMAL LOW (ref 98–111)
Creatinine, Ser: 1.11 mg/dL — ABNORMAL HIGH (ref 0.44–1.00)
GFR, Estimated: 54 mL/min — ABNORMAL LOW (ref >60)
Glucose, Bld: 165 mg/dL — ABNORMAL HIGH (ref 70–99)
Potassium: 3.5 mmol/L (ref 3.5–5.1)
Sodium: 135 mmol/L (ref 135–145)
Total Bilirubin: 0.5 mg/dL (ref 0.3–1.2)
Total Protein: 7.7 g/dL (ref 6.5–8.1)

## 2021-06-23 LAB — CBC
HCT: 20.9 % — ABNORMAL LOW (ref 36.0–46.0)
Hemoglobin: 5.8 g/dL — CL (ref 12.0–15.0)
MCH: 22 pg — ABNORMAL LOW (ref 26.0–34.0)
MCHC: 27.8 g/dL — ABNORMAL LOW (ref 30.0–36.0)
MCV: 79.2 fL — ABNORMAL LOW (ref 80.0–100.0)
Platelets: 300 10*3/uL (ref 150–400)
RBC: 2.64 MIL/uL — ABNORMAL LOW (ref 3.87–5.11)
RDW: 18.9 % — ABNORMAL HIGH (ref 11.5–15.5)
WBC: 9.6 10*3/uL (ref 4.0–10.5)
nRBC: 0 % (ref 0.0–0.2)

## 2021-06-23 LAB — RESP PANEL BY RT-PCR (FLU A&B, COVID) ARPGX2
Influenza A by PCR: NEGATIVE
Influenza B by PCR: NEGATIVE
SARS Coronavirus 2 by RT PCR: NEGATIVE

## 2021-06-23 LAB — PREPARE RBC (CROSSMATCH)

## 2021-06-23 LAB — BRAIN NATRIURETIC PEPTIDE: B Natriuretic Peptide: 119 pg/mL — ABNORMAL HIGH (ref 0.0–100.0)

## 2021-06-23 MED ORDER — ALBUTEROL SULFATE (2.5 MG/3ML) 0.083% IN NEBU: 2.5000 mg | INHALATION_SOLUTION | Freq: Four times a day (QID) | RESPIRATORY_TRACT | Status: DC | PRN

## 2021-06-23 MED ORDER — IPRATROPIUM BROMIDE 0.02 % IN SOLN
2.5000 mL | Freq: Four times a day (QID) | RESPIRATORY_TRACT | Status: DC
  Administered 2021-06-24: 0.5 mg via RESPIRATORY_TRACT
  Filled 2021-06-23: qty 2.5

## 2021-06-23 MED ORDER — CHLORHEXIDINE GLUCONATE CLOTH 2 % EX PADS
6.0000 | MEDICATED_PAD | Freq: Every day | CUTANEOUS | Status: DC
  Administered 2021-06-23 – 2021-06-24 (×2): 6 via TOPICAL

## 2021-06-23 MED ORDER — GEMFIBROZIL 600 MG PO TABS
600.0000 mg | ORAL_TABLET | Freq: Once | ORAL | Status: AC
  Administered 2021-06-23: 600 mg via ORAL

## 2021-06-23 MED ORDER — ACETAMINOPHEN 650 MG RE SUPP: 650.0000 mg | Freq: Four times a day (QID) | RECTAL | Status: DC | PRN

## 2021-06-23 MED ORDER — DILTIAZEM HCL ER COATED BEADS 120 MG PO CP24
120.0000 mg | ORAL_CAPSULE | Freq: Every day | ORAL | Status: DC
  Administered 2021-06-24 – 2021-06-27 (×4): 120 mg via ORAL
  Filled 2021-06-23 (×5): qty 1

## 2021-06-23 MED ORDER — OXYCODONE HCL 5 MG PO TABS
5.0000 mg | ORAL_TABLET | ORAL | Status: DC | PRN
  Administered 2021-06-23 – 2021-06-26 (×4): 5 mg via ORAL
  Filled 2021-06-23 (×6): qty 1

## 2021-06-23 MED ORDER — ONDANSETRON HCL 4 MG/2ML IJ SOLN
4.0000 mg | Freq: Four times a day (QID) | INTRAMUSCULAR | Status: DC | PRN
  Administered 2021-06-25 – 2021-06-27 (×4): 4 mg via INTRAVENOUS
  Filled 2021-06-23 (×4): qty 2

## 2021-06-23 MED ORDER — DULOXETINE HCL 60 MG PO CPEP
60.0000 mg | ORAL_CAPSULE | Freq: Two times a day (BID) | ORAL | Status: DC
  Administered 2021-06-23 – 2021-06-27 (×8): 60 mg via ORAL
  Filled 2021-06-23 (×9): qty 1

## 2021-06-23 MED ORDER — DIPHENHYDRAMINE HCL 25 MG PO CAPS
25.0000 mg | ORAL_CAPSULE | Freq: Once | ORAL | Status: AC
  Administered 2021-06-23: 25 mg via ORAL
  Filled 2021-06-23: qty 1

## 2021-06-23 MED ORDER — MAGNESIUM HYDROXIDE 400 MG/5ML PO SUSP
15.0000 mL | Freq: Every day | ORAL | Status: DC
  Administered 2021-06-23 – 2021-06-26 (×4): 15 mL via ORAL
  Filled 2021-06-23 (×4): qty 30

## 2021-06-23 MED ORDER — GABAPENTIN 400 MG PO CAPS
800.0000 mg | ORAL_CAPSULE | Freq: Three times a day (TID) | ORAL | Status: DC
  Administered 2021-06-23 – 2021-06-27 (×11): 800 mg via ORAL
  Filled 2021-06-23 (×12): qty 2

## 2021-06-23 MED ORDER — FUROSEMIDE 10 MG/ML IJ SOLN
20.0000 mg | Freq: Once | INTRAMUSCULAR | Status: AC
  Administered 2021-06-24: 20 mg via INTRAVENOUS
  Filled 2021-06-23: qty 2

## 2021-06-23 MED ORDER — OXYCODONE HCL ER 15 MG PO T12A
15.0000 mg | EXTENDED_RELEASE_TABLET | Freq: Two times a day (BID) | ORAL | Status: DC
  Administered 2021-06-24 – 2021-06-27 (×7): 15 mg via ORAL
  Filled 2021-06-23 (×8): qty 1

## 2021-06-23 MED ORDER — DOXEPIN HCL 10 MG PO CAPS
10.0000 mg | ORAL_CAPSULE | Freq: Every day | ORAL | Status: DC
  Administered 2021-06-23 – 2021-06-26 (×4): 10 mg via ORAL
  Filled 2021-06-23 (×6): qty 1

## 2021-06-23 MED ORDER — GABAPENTIN 800 MG PO TABS
800.0000 mg | ORAL_TABLET | Freq: Three times a day (TID) | ORAL | Status: DC
  Filled 2021-06-23 (×5): qty 1

## 2021-06-23 MED ORDER — GEMFIBROZIL 600 MG PO TABS
600.0000 mg | ORAL_TABLET | Freq: Two times a day (BID) | ORAL | Status: DC
  Administered 2021-06-24 – 2021-06-27 (×7): 600 mg via ORAL
  Filled 2021-06-23 (×8): qty 1

## 2021-06-23 MED ORDER — ACYCLOVIR 800 MG PO TABS
400.0000 mg | ORAL_TABLET | Freq: Two times a day (BID) | ORAL | Status: DC
  Administered 2021-06-23 – 2021-06-27 (×8): 400 mg via ORAL
  Filled 2021-06-23 (×8): qty 1

## 2021-06-23 MED ORDER — ONDANSETRON HCL 4 MG PO TABS
4.0000 mg | ORAL_TABLET | Freq: Four times a day (QID) | ORAL | Status: DC | PRN
  Administered 2021-06-26 (×3): 4 mg via ORAL
  Filled 2021-06-23 (×3): qty 1

## 2021-06-23 MED ORDER — MONTELUKAST SODIUM 10 MG PO TABS
10.0000 mg | ORAL_TABLET | Freq: Every day | ORAL | Status: DC
  Administered 2021-06-23 – 2021-06-26 (×4): 10 mg via ORAL
  Filled 2021-06-23 (×4): qty 1

## 2021-06-23 MED ORDER — LORAZEPAM 0.5 MG PO TABS
0.5000 mg | ORAL_TABLET | Freq: Three times a day (TID) | ORAL | Status: DC | PRN
  Administered 2021-06-23 – 2021-06-26 (×8): 0.5 mg via ORAL
  Filled 2021-06-23 (×8): qty 1

## 2021-06-23 MED ORDER — PANTOPRAZOLE SODIUM 40 MG IV SOLR: 40.0000 mg | INTRAVENOUS | Status: DC

## 2021-06-23 MED ORDER — LORATADINE 10 MG PO TABS
10.0000 mg | ORAL_TABLET | Freq: Every day | ORAL | Status: DC
  Administered 2021-06-24 – 2021-06-27 (×4): 10 mg via ORAL
  Filled 2021-06-23 (×4): qty 1

## 2021-06-23 MED ORDER — POTASSIUM CHLORIDE CRYS ER 20 MEQ PO TBCR
40.0000 meq | EXTENDED_RELEASE_TABLET | Freq: Once | ORAL | Status: AC
  Administered 2021-06-23: 40 meq via ORAL
  Filled 2021-06-23: qty 2

## 2021-06-23 MED ORDER — ASPIRIN EC 81 MG PO TBEC
81.0000 mg | DELAYED_RELEASE_TABLET | Freq: Every day | ORAL | Status: DC
  Administered 2021-06-24 – 2021-06-25 (×2): 81 mg via ORAL
  Filled 2021-06-23 (×2): qty 1

## 2021-06-23 MED ORDER — POLYETHYLENE GLYCOL 3350 17 G PO PACK: 17.0000 g | PACK | Freq: Every day | ORAL | Status: DC | PRN

## 2021-06-23 MED ORDER — PANTOPRAZOLE SODIUM 40 MG IV SOLR
40.0000 mg | Freq: Two times a day (BID) | INTRAVENOUS | Status: DC
  Administered 2021-06-24 – 2021-06-26 (×6): 40 mg via INTRAVENOUS
  Filled 2021-06-23 (×6): qty 40

## 2021-06-23 MED ORDER — POTASSIUM CHLORIDE CRYS ER 20 MEQ PO TBCR: 40.0000 meq | EXTENDED_RELEASE_TABLET | Freq: Once | ORAL | Status: DC

## 2021-06-23 MED ORDER — ACETAMINOPHEN 325 MG PO TABS
650.0000 mg | ORAL_TABLET | Freq: Four times a day (QID) | ORAL | Status: DC | PRN
  Administered 2021-06-24 – 2021-06-27 (×3): 650 mg via ORAL
  Filled 2021-06-23 (×3): qty 2

## 2021-06-23 NOTE — ED Notes (Signed)
Report given to [facility] in ICU

## 2021-06-23 NOTE — ED Triage Notes (Signed)
EMS reports pt is told she need a blood transfusion.  Pt alert and oriented reports she is weak, H/6.2 per pt.  Pt tearful and crying in triage.

## 2021-06-23 NOTE — ED Provider Notes (Signed)
[facility] EMERGENCY DEPARTMENT Provider Note   CSN: [id number] Arrival date & time: 06/23/21  1448     History Chief Complaint  Patient presents with   Low Hemoglobin    Kayla Ryan is a 65 y.o. female.  HPI Patient presents with shortness of breath.  Patient's PCP reportedly checked a hemoglobin found to be 6.2.  History of recurrent anemia is due to iron deficiency.  Reportedly no GI bleeds.  States she has had multiple transfusions over the last 5 years.  Most recently was about 2 months ago.  States she has more shortness of breath than normal.  Occasional cough.  No swelling in her legs.  States her weight is up but it is because she is "fat".  No blood in the stool or black stools.  States that she is allergic to iron.    Past Medical History:  Diagnosis Date   Anemia    Arthritis    Asthma    Chest pain    Chronic bronchitis    Congestive heart failure (CHF) (HCC)    COPD (chronic obstructive pulmonary disease) ([place])    Crohn disease ([place])    Emphysema    Emphysema of lung (HCC)    Fibromyalgia    Herpes    Hyperlipidemia    IBS (irritable bowel syndrome)    Kidney failure    Migraines    Muscular dystrophy ([place])    Neck pain    Plantar fasciitis    Polymyositis ([place])    Scoliosis     Patient Active Problem List   Diagnosis Date Noted   Community acquired pneumonia of left lower lobe of lung    Gastroenteritis 04/30/2021   Chronic respiratory failure with hypoxia ([place]) 01/28/2021   AKI (acute kidney injury) ([place]) 08/18/2020   Hypothyroidism 07/26/2020   Acute respiratory failure with hypoxia ([place]) 07/26/2020   B12 and Folate deficiency 07/26/2020   Folate deficiency anemia--B12 AND Folate deficiency 07/26/2020   Atrial fibrillation with RVR ([place]) 07/25/2020   Acute on chronic anemia 07/24/2020   Leukocytosis 07/24/2020   Pulmonary nodules 07/24/2020   Acute CHF (congestive heart failure) ([place]) 07/24/2020   Dyspnea 01/31/2020   Anemia  01/03/2020   Syncope 06/08/2019   Hypokalemia 06/08/2019   Trigger finger, acquired 11/03/2017   Chronic obstructive pulmonary disease/Emphysema 08/23/2017   Chronic pain 08/23/2017   History of Crohn's disease 08/23/2017   Myopathy 08/23/2017   Right shoulder injury, subsequent encounter 08/23/2017   Primary insomnia 02/01/2017   Esophageal dysphagia 10/06/2016   Essential hypertension 07/17/2016   Iron deficiency anemia 03/25/2016   History of Bell's palsy 03/03/2015   Nuclear sclerosis of both eyes 03/03/2015   Allergic rhinitis 05/18/2014   Anxiety 05/18/2014   Arthritis 05/18/2014   Asthma 05/18/2014   Chronic constipation 05/18/2014   Dermatomyositis ([place]) 05/18/2014   Migraine without status migrainosus, not intractable 05/18/2014   Rectocele 05/18/2014   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   Peripheral neuropathy 05/18/2014   Chest pain 10/30/2013   Hyperlipidemia 10/30/2013   Carotid artery disease ([place]) 10/30/2013    Past Surgical History:  Procedure Laterality Date   ABDOMINAL HYSTERECTOMY     bone spur     CHOLECYSTECTOMY     COLONOSCOPY WITH PROPOFOL N/A 04/30/2021   Procedure: COLONOSCOPY WITH PROPOFOL;  Surgeon: Daneil Dolin, MD;  Location: [facility] ENDO SUITE;  Service: Endoscopy;  Laterality: N/A;   ESOPHAGEAL DILATION N/A 04/30/2021   Procedure: ESOPHAGEAL DILATION;  Surgeon:  Rourk, Cristopher Estimable, MD;  Location: [facility] ENDO SU[facility];  Service: Endoscopy;  Laterality: N/A;   ESOPHAGOGASTRODUODENOSCOPY (EGD) WITH PROPOFOL N/A 04/30/2021   Procedure: ESOPHAGOGASTRODUODENOSCOPY (EGD) WITH PROPOFOL;  Surgeon: Daneil Dolin, MD;  Location: [facility] ENDO SU[facility];  Service: Endoscopy;  Laterality: N/A;   HERNIA REPAIR     LEFT HEART CATHETERIZATION WITH CORONARY ANGIOGRAM N/A 11/07/2013   Procedure: LEFT HEART CATHETERIZATION WITH CORONARY ANGIOGRAM;  Surgeon: Lorretta Harp, MD;  Location: [facility] CATH LAB;  Service: Cardiovascular;  Laterality: N/A;   MASTECTOMY PARTIAL /  LUMPECTOMY     OOPHORECTOMY     ROTATOR CUFF REPAIR       OB History   No obstetric history on file.     Family History  Problem Relation Age of Onset   Lung cancer Mother    COPD Father    Lung cancer Father    Lymphoma Father    Anxiety disorder Sister    Depression Brother     Social History   Tobacco Use   Smoking status: Former    Packs/day: 1.00    Years: 49.00    Pack years: 49.00    Types: Cigarettes    Start date: 08/08/1966    Quit date: 08/31/2005    Years since quitting: 15.8   Smokeless tobacco: Never  Vaping Use   Vaping Use: Never used  Substance Use Topics   Alcohol use: No   Drug use: No    Comment: used marijuana in teens    Home Medications Prior to Admission medications   Medication Sig Start Date End Date Taking? Authorizing Provider  acyclovir (ZOVIRAX) 400 MG tablet Take 400 mg by mouth 2 (two) times daily. 05/18/21  Yes [provider]  albuterol (PROVENTIL) (2.5 MG/3ML) 0.083% nebulizer solution Take 2.5 mg by nebulization every 6 (six) hours as needed for wheezing or shortness of breath.   Yes [provider]  aspirin (ASPIRIN 81) 81 MG EC tablet Take 1 tablet (81 mg total) by mouth daily. Swallow whole. 05/03/21  Yes Tat, Shanon Brow, MD  cetirizine (ZYRTEC) 10 MG tablet Take 10 mg by mouth daily.   Yes [provider]  diltiazem (CARDIZEM CD) 120 MG 24 hr capsule Take 1 capsule (120 mg total) by mouth daily. 05/03/21 05/03/22 Yes Tat, Shanon Brow, MD  doxepin (SINEQUAN) 10 MG capsule Take 10 mg by mouth at bedtime. 05/09/21  Yes [provider]  DULoxetine (CYMBALTA) 60 MG capsule Take 1 capsule (60 mg total) by mouth 2 (two) times daily. 07/26/20  Yes Emokpae, Courage, MD  folic acid (FOLVITE) 1 MG tablet Take 1 tablet (1 mg total) by mouth daily. 07/27/20  Yes Emokpae, Courage, MD  gabapentin (NEURONTIN) 800 MG tablet Take 800 mg by mouth 3 (three) times daily. 06/08/21  Yes [provider]  gemfibrozil (LOPID)  600 MG tablet Take 600 mg by mouth 2 (two) times daily before a meal.   Yes [provider]  ipratropium (ATROVENT HFA) 17 MCG/ACT inhaler Inhale 2 puffs into the lungs every 6 (six) hours.   Yes [provider]  LORazepam (ATIVAN) 0.5 MG tablet Take 1 tablet (0.5 mg total) by mouth 3 (three) times daily as needed for anxiety. 08/19/20  Yes Shah, Pratik D, DO  magnesium hydroxide (MILK OF MAGNESIA) 400 MG/5ML suspension Take 15 mLs by mouth at bedtime.   Yes [provider]  montelukast (SINGULAIR) 10 MG tablet Take 10 mg by mouth daily. 02/23/21  Yes [provider]  NITROSTAT 0.4 MG SL tablet Place 0.4 mg under the tongue every 15 (fifteen) minutes as needed for chest pain.  09/22/13  Yes [provider]  omeprazole (PRILOSEC) 40 MG capsule Take 40 mg by mouth 2 (two) times daily. 05/13/21  Yes [provider]  ondansetron (ZOFRAN-ODT) 4 MG disintegrating tablet Take 2 mg by mouth every 6 (six) hours as needed. 12/18/20  Yes [provider]  oxyCODONE (OXY IR/ROXICODONE) 5 MG immediate release tablet Take 1 tablet (5 mg total) by mouth every 4 (four) hours as needed for moderate pain or severe pain. 08/19/20  Yes Shah, Pratik D, DO  Tiotropium Bromide-Olodaterol (STIOLTO RESPIMAT) 2.5-2.5 MCG/ACT AERS Inhale 2 puffs into the lungs daily. 12/07/20  Yes Collene Gobble, MD  XTAMPZA ER 18 MG C12A Take 1 capsule by mouth 2 (two) times daily. 08/19/20  Yes Shah, Pratik D, DO  BELSOMRA 20 MG TABS Take 1 tablet by mouth at bedtime as needed. Patient not taking: Reported on 06/23/2021 04/13/21   [provider]  doxycycline (VIBRA-TABS) 100 MG tablet Take 1 tablet (100 mg total) by mouth 2 (two) times daily. Patient not taking: No sig reported 05/14/21   Sherrilyn Rist A, MD  gabapentin (NEURONTIN) 300 MG capsule Take 300 mg by mouth 3 (three) times daily. Patient not taking: Reported on 06/23/2021    [provider]  pantoprazole  (PROTONIX) 40 MG tablet Take 1 tablet (40 mg total) by mouth 2 (two) times daily. Patient not taking: No sig reported 05/03/21   Tat, Shanon Brow, MD  valACYclovir (VALTREX) 500 MG tablet Take 500 mg by mouth 2 (two) times daily. Patient not taking: Reported on 06/23/2021    [provider]    Allergies    Sulfa antibiotics, Dilaudid [hydromorphone hcl], Iron, Metronidazole, Prednisone, Cephalexin, Methotrexate derivatives, and Morphine and related  Review of Systems   Review of Systems  Constitutional:  Negative for appetite change.  HENT:  Negative for congestion.   Respiratory:  Positive for shortness of breath.   Cardiovascular:  Negative for chest pain and leg swelling.  Gastrointestinal:  Negative for abdominal pain.  Musculoskeletal:  Negative for gait problem and joint swelling.  Skin:  Negative for rash.  Neurological:  Positive for light-headedness.  Psychiatric/Behavioral:  Negative for confusion.    Physical Exam Updated Vital Signs BP (!) 138/93   Pulse 75   Temp 98.8 F (37.1 C) (Oral)   Resp 20   SpO2 100%   Physical Exam Vitals and nursing note reviewed.  HENT:     Head: Atraumatic.  Eyes:     Pupils: Pupils are equal, round, and reactive to light.  Cardiovascular:     Rate and Rhythm: Regular rhythm.  Pulmonary:     Breath sounds: Wheezing present.  Abdominal:     Tenderness: There is no abdominal tenderness.  Musculoskeletal:     Cervical back: Neck supple.     Right lower leg: No edema.     Left lower leg: No edema.  Skin:    General: Skin is warm.     Capillary Refill: Capillary refill takes less than 2 seconds.     Coloration: Skin is pale.  Neurological:     Mental Status: She is alert and oriented to person, place, and time.    ED Results / Procedures / Treatments   Labs (all labs ordered are listed, but only abnormal results are displayed) Labs Reviewed  COMPREHENSIVE METABOLIC PANEL - Abnormal; Notable for the following  components:       Result Value   Chloride 97 (*)    Glucose, Bld 165 (*)    Creatinine, Ser 1.11 (*)    Calcium 8.8 (*)    GFR, Estimated 54 (*)    All other components within normal limits  CBC - Abnormal; Notable for the following components:   RBC 2.64 (*)    Hemoglobin 5.8 (*)    HCT 20.9 (*)    MCV 79.2 (*)    MCH 22.0 (*)    MCHC 27.8 (*)    RDW 18.9 (*)    All other components within normal limits  BRAIN NATRIURETIC PEPTIDE - Abnormal; Notable for the following components:   B Natriuretic Peptide 119.0 (*)    All other components within normal limits  TYPE AND SCREEN    EKG None  Radiology DG Chest Portable 1 View  Result Date: 06/23/2021 CLINICAL DATA:  Shortness of breath. EXAM: PORTABLE CHEST 1 VIEW COMPARISON:  Chest x-ray 06/06/2021. FINDINGS: There are minimal left basilar opacities. The lungs are otherwise clear. No pleural effusion or pneumothorax. Cardiomediastinal silhouette within normal limits. No acute fractures. IMPRESSION: 1. Minimal left basilar atelectasis/airspace disease. Electronically Signed   By: Ronney Asters M.D.   On: 06/23/2021 16:25    Procedures Procedures   Medications Ordered in ED Medications - No data to display  ED Course  I have reviewed the triage vital signs and the nursing notes.  Pertinent labs & imaging results that were available during my care of the patient were reviewed by me and considered in my medical decision making (see chart for details).    MDM Rules/Calculators/A&P                           Patient presents after being sent in for anemia.  Reportedly hemoglobin is 6.2 through PCP.  Target hemoglobin about 9 but had recurrent anemias.  Has had colonoscopies and other work-up without finding cause of the anemia.  Reportedly iron deficient.  Feeling more short of breath.  On chronic oxygen.  No coughing no fever.  Also history of CHF but states her weight is up but only because she is "fat".  BNP mildly elevated.  Chest x-ray  shows atelectasis versus infiltrate.  Normal white count.  However with hemoglobin of 5.8 here and symptomatic will require admission and transfusion.  Will discuss with hospitalist  CRITICAL CARE Performed by: Davonna Belling Total critical care time: 30 minutes Critical care time was exclusive of separately billable procedures and treating other patients. Critical care was necessary to treat or prevent imminent or life-threatening deterioration. Critical care was time spent personally by me on the following activities: development of treatment plan with patient and/or surrogate as well as nursing, discussions with consultants, evaluation of patient's response to treatment, examination of patient, obtaining history from patient or surrogate, ordering and performing treatments and interventions, ordering and review of laboratory studies, ordering and review of radiographic studies, pulse oximetry and re-evaluation of patient's condition.  Final Clinical Impression(s) / ED Diagnoses Final diagnoses:  Symptomatic anemia    Rx / DC Orders ED Discharge Orders     None        Davonna Belling, MD 06/23/21 [phone]

## 2021-06-23 NOTE — ED Notes (Signed)
Pt up to bathroom and back to bed; pt hooked back up to monitor, requesting pain medication;

## 2021-06-23 NOTE — H&P (Addendum)
History and Physical    Kayla Ryan:[id number] DOB: 1956-07-31 DOA: 06/23/2021  PCP: Guadlupe Spanish, MD   Patient coming from: Home  I have personally briefly reviewed patient's old medical records in [place]  Chief Complaint: Weakness, low hemoglobin  HPI: Kayla Ryan is a 65 y.o. female with medical history significant for atrial fibrillation, asthma, COPD with chronic respiratory failure, hypertension, Crohn's disease, chronic anemia,  Multiple transfusions. Patient was sent to the ED by outpatient provider reports of low hemoglobin of 6.2.  Patient came in by EMS.  Patient reports over the past few days to weeks, she has been weak.  She has had difficulty breathing with exertion but at rest she is okay.  She denies abdominal pain. Patient reports having 8 episodes of coffee colored stool in a day about a week ago.  She denies vomiting of blood or blood in stools.  She also reports fever at home up to 99, and another time up to 100.  She reports since her hospitalization in September she has had a persistent cough which is unchanged.  Cough is now dry.  She reports base line 3 -4 stools daily, and reports right lower quadrant abdominal pain with bloating today.  Hospitalization 9/22-9/26 for acute on chronic anemia, received 2 units PRBC, treated for multifocal pneumonia with Levaquin.  Stay was complicated by paroxysmal atrial fibrillation with RVR.  Patient was discharged home on diltiazem 120 mg daily.  She was not started on anticoagulation due to recurrent anemia.  ED Course: O2 sats 100% on 4 L.  Temperature 98.8.  Respiratory 16-20.  Heart rate 75-85.  Hemoglobin 5.8.  WBC 9.6.  Chest x-ray shows minimal left basilar atelectasis/airspace disease.  Hospitalist admit for symptomatic anemia  Review of Systems: As per HPI all other systems reviewed and negative.  Past Medical History:  Diagnosis Date   Anemia    Arthritis    Asthma    Chest pain    Chronic  bronchitis    Congestive heart failure (CHF) (HCC)    COPD (chronic obstructive pulmonary disease) (HCC)    Crohn disease (HCC)    Emphysema    Emphysema of lung (HCC)    Fibromyalgia    Herpes    Hyperlipidemia    IBS (irritable bowel syndrome)    Kidney failure    Migraines    Muscular dystrophy ([place])    Neck pain    Plantar fasciitis    Polymyositis ([place])    Scoliosis     Past Surgical History:  Procedure Laterality Date   ABDOMINAL HYSTERECTOMY     bone spur     CHOLECYSTECTOMY     COLONOSCOPY WITH PROPOFOL N/A 04/30/2021   Procedure: COLONOSCOPY WITH PROPOFOL;  Surgeon: Daneil Dolin, MD;  [facility]: [facility];  Service: Endoscopy;  Laterality: N/A;   ESOPHAGEAL DILATION N/A 04/30/2021   Procedure: ESOPHAGEAL DILATION;  Surgeon: Daneil Dolin, MD;  Location: [facility];  Service: Endoscopy;  Laterality: N/A;   ESOPHAGOGASTRODUODENOSCOPY (EGD) WITH PROPOFOL N/A 04/30/2021   Procedure: ESOPHAGOGASTRODUODENOSCOPY (EGD) WITH PROPOFOL;  Surgeon: Daneil Dolin, MD;  Location: [facility];  Service: Endoscopy;  Laterality: N/A;   HERNIA REPAIR     LEFT HEART CATHETERIZATION WITH CORONARY ANGIOGRAM N/A 11/07/2013   Procedure: LEFT HEART CATHETERIZATION WITH CORONARY ANGIOGRAM;  Surgeon: Lorretta Harp, MD;  Location: [facility] CATH LAB;  Service: Cardiovascular;  Laterality: N/A;   MASTECTOMY PARTIAL / LUMPECTOMY  OOPHORECTOMY     ROTATOR CUFF REPAIR       reports that she quit smoking about 15 years ago. Her smoking use included cigarettes. She started smoking about 54 years ago. She has a 49.00 pack-year smoking history. She has never used smokeless tobacco. She reports that she does not drink alcohol and does not use drugs.  Allergies  Allergen Reactions   Sulfa Antibiotics Shortness Of Breath, Swelling and Rash   Dilaudid [Hydromorphone Hcl]     Makes me crazy   Iron     Throat starts closing up, tongue swells, severe headaches and backpain   Metronidazole  Diarrhea and Nausea And Vomiting   Prednisone Other (See Comments)    angry angry    Cephalexin Diarrhea and Nausea And Vomiting   Methotrexate Derivatives Swelling and Rash   Morphine And Related Anxiety    "exreme mood swings"    Family History  Problem Relation Age of Onset   Lung cancer Mother    COPD Father    Lung cancer Father    Lymphoma Father    Anxiety disorder Sister    Depression Brother    Prior to Admission medications   Medication Sig Start Date End Date Taking? Authorizing Provider  acyclovir (ZOVIRAX) 400 MG tablet Take 400 mg by mouth 2 (two) times daily. 05/18/21  Yes [provider]  albuterol (PROVENTIL) (2.5 MG/3ML) 0.083% nebulizer solution Take 2.5 mg by nebulization every 6 (six) hours as needed for wheezing or shortness of breath.   Yes [provider]  aspirin (ASPIRIN 81) 81 MG EC tablet Take 1 tablet (81 mg total) by mouth daily. Swallow whole. 05/03/21  Yes Tat, Shanon Brow, MD  cetirizine (ZYRTEC) 10 MG tablet Take 10 mg by mouth daily.   Yes [provider]  diltiazem (CARDIZEM CD) 120 MG 24 hr capsule Take 1 capsule (120 mg total) by mouth daily. 05/03/21 05/03/22 Yes Tat, Shanon Brow, MD  doxepin (SINEQUAN) 10 MG capsule Take 10 mg by mouth at bedtime. 05/09/21  Yes [provider]  DULoxetine (CYMBALTA) 60 MG capsule Take 1 capsule (60 mg total) by mouth 2 (two) times daily. 07/26/20  Yes Tiara Maultsby, Courage, MD  folic acid (FOLVITE) 1 MG tablet Take 1 tablet (1 mg total) by mouth daily. 07/27/20  Yes Ryon Layton, Courage, MD  gabapentin (NEURONTIN) 800 MG tablet Take 800 mg by mouth 3 (three) times daily. 06/08/21  Yes [provider]  gemfibrozil (LOPID) 600 MG tablet Take 600 mg by mouth 2 (two) times daily before a meal.   Yes [provider]  ipratropium (ATROVENT HFA) 17 MCG/ACT inhaler Inhale 2 puffs into the lungs every 6 (six) hours.   Yes [provider]  LORazepam (ATIVAN) 0.5 MG tablet Take 1  tablet (0.5 mg total) by mouth 3 (three) times daily as needed for anxiety. 08/19/20  Yes Shah, Pratik D, DO  magnesium hydroxide (MILK OF MAGNESIA) 400 MG/5ML suspension Take 15 mLs by mouth at bedtime.   Yes [provider]  montelukast (SINGULAIR) 10 MG tablet Take 10 mg by mouth daily. 02/23/21  Yes [provider]  NITROSTAT 0.4 MG SL tablet Place 0.4 mg under the tongue every 15 (fifteen) minutes as needed for chest pain.  09/22/13  Yes [provider]  omeprazole (PRILOSEC) 40 MG capsule Take 40 mg by mouth 2 (two) times daily. 05/13/21  Yes [provider]  ondansetron (ZOFRAN-ODT) 4 MG disintegrating tablet Take 2 mg by mouth every 6 (six)  hours as needed. 12/18/20  Yes [provider]  oxyCODONE (OXY IR/ROXICODONE) 5 MG immediate release tablet Take 1 tablet (5 mg total) by mouth every 4 (four) hours as needed for moderate pain or severe pain. 08/19/20  Yes Shah, Pratik D, DO  Tiotropium Bromide-Olodaterol (STIOLTO RESPIMAT) 2.5-2.5 MCG/ACT AERS Inhale 2 puffs into the lungs daily. 12/07/20  Yes Collene Gobble, MD  XTAMPZA ER 18 MG C12A Take 1 capsule by mouth 2 (two) times daily. 08/19/20  Yes Shah, Pratik D, DO  BELSOMRA 20 MG TABS Take 1 tablet by mouth at bedtime as needed. Patient not taking: Reported on 06/23/2021 04/13/21   [provider]  doxycycline (VIBRA-TABS) 100 MG tablet Take 1 tablet (100 mg total) by mouth 2 (two) times daily. Patient not taking: No sig reported 05/14/21   Sherrilyn Rist A, MD  gabapentin (NEURONTIN) 300 MG capsule Take 300 mg by mouth 3 (three) times daily. Patient not taking: Reported on 06/23/2021    [provider]  pantoprazole (PROTONIX) 40 MG tablet Take 1 tablet (40 mg total) by mouth 2 (two) times daily. Patient not taking: No sig reported 05/03/21   Tat, Shanon Brow, MD  valACYclovir (VALTREX) 500 MG tablet Take 500 mg by mouth 2 (two) times daily. Patient not taking: Reported on 06/23/2021     [provider]    Physical Exam: Vitals:   06/23/21 1459 06/23/21 1500 06/23/21 1501 06/23/21 1630  BP:   (!) 127/100 (!) 138/93  Pulse:   78 75  Resp:   19 20  Temp:  98.8 F (37.1 C) 98.8 F (37.1 C)   TempSrc:  Oral Oral   SpO2: 100%  100% 100%    Constitutional: Eating dinner, calm, comfortable Vitals:   06/23/21 1459 06/23/21 1500 06/23/21 1501 06/23/21 1630  BP:   (!) 127/100 (!) 138/93  Pulse:   78 75  Resp:   19 20  Temp:  98.8 F (37.1 C) 98.8 F (37.1 C)   TempSrc:  Oral Oral   SpO2: 100%  100% 100%   Eyes:, Pupils equal, lids and conjunctivae normal ENMT: Mucous membranes are moist.  Neck: normal, supple, no masses, no thyromegaly Respiratory: clear to auscultation bilaterally, no wheezing, no crackles. Normal respiratory effort. No accessory muscle use.  Cardiovascular: Regular rate and rhythm, faint systolic murmur aortic area, no rubs / gallops. No extremity edema. 2+ pedal pulses.  Abdomen: later reported right lower abdominal pain, but no tenderness appreciated and abdomen appears bloated, no masses palpated. No hepatosplenomegaly.   Musculoskeletal: no clubbing / cyanosis. No joint deformity upper and lower extremities. Good ROM, no contractures. Normal muscle tone.  Skin: no rashes, lesions, ulcers. No induration Neurologic: No apparent cranial nerve abnormality, moving EXTR spontaneously. Psychiatric: Normal judgment and insight. Alert and oriented x 3. Normal mood.   Labs on Admission: I have personally reviewed following labs and imaging studies  CBC: Recent Labs  Lab 06/23/21 1559  WBC 9.6  HGB 5.8*  HCT 20.9*  MCV 79.2*  PLT 147   Basic Metabolic Panel: Recent Labs  Lab 06/23/21 1559  NA 135  K 3.5  CL 97*  CO2 26  GLUCOSE 165*  BUN 8  CREATININE 1.11*  CALCIUM 8.8*   Liver Function Tests: Recent Labs  Lab 06/23/21 1559  AST 25  ALT 14  ALKPHOS 96  BILITOT 0.5  PROT 7.7  ALBUMIN 4.1   BNP (last 3  results) Recent Labs    01/28/21 1329  PROBNP 73.0      Radiological Exams on Admission: DG Chest Portable 1 View  Result Date: 06/23/2021 CLINICAL DATA:  Shortness of breath. EXAM: PORTABLE CHEST 1 VIEW COMPARISON:  Chest x-ray 06/06/2021. FINDINGS: There are minimal left basilar opacities. The lungs are otherwise clear. No pleural effusion or pneumothorax. Cardiomediastinal silhouette within normal limits. No acute fractures. IMPRESSION: 1. Minimal left basilar atelectasis/airspace disease. Electronically Signed   By: Ronney Asters M.D.   On: 06/23/2021 16:25    EKG: Independently reviewed.  Sinus rhythm rate 70s, QTc 488.  No significant ST or T wave changes.  Assessment/Plan Principal Problem:   Acute on chronic anemia Active Problems:   Iron deficiency anemia   Asthma   Chronic obstructive pulmonary disease/Emphysema   Dermatomyositis (HCC)   Essential hypertension   Chronic respiratory failure with hypoxia (HCC)   Acute on chronic anemia-hemoglobin 5.8, last check from discharge from the hospital 9/26 hemoglobin was 7.8.  History of multiple blood transfusions.  Reports have coffee coloured stools, 1 week ago. Recent admission had EGD and colonoscopy which 9/23 , did not identify etiology of bleed.  Recommendation for outpatient capsule endoscopy is has not been done. -Transfuse 2 units PRBC. -IV Protonix 40 BID -GI evaluation in the morning. -Just in case n.p.o. midnight -Abdominal bloating will obtain KUB  Mechanical fall with persistent right elbow pain and swelling.  Patient tripped on her oxygen cord.  Was in the ED 10/30.  Imaging included head CT and right elbow x-ray.  Patient reports persistent pain and swelling, diffuse disease small piece of bone broken and it causes severe pain when she moves it around. -Exam unremarkable, repeat right elbow x-ray- Also negative.  Dyspnea-with exertion, she also reports stable persistent cough since treatment for pneumonia in  September with (cephalosporin allergy).  She reports fevers at home, but here she is afebrile without leukocytosis.  She is well-appearing.  Chest x-ray suggest minimal left basilar atelectasis versus airspace disease. -Held off on antibiotics for now  COPD with chronic respiratory failure-on 4 L.  Stable. -Resume home bronchodilator regimen  Dermatomyositis, chronic pain- her provider is with [facility] and as needed oxycodone  Paroxysmal atrial fibrillation-currently in sinus rhythm.  Not on anticoagulation due to recurrent anemia. -Resume Cardizem  Anxiety/depression -Resume duloxetine, doxepin, Ativan as needed   DVT prophylaxis: SCDs Code Status: full code Family Communication: None at bed Side Disposition Plan: ~ 1 - 2 days  Consults called: GI Admission status: obs tele  Bethena Roys MD [facility]  06/23/2021, 9:14 PM   `

## 2021-06-24 ENCOUNTER — Encounter (HOSPITAL_COMMUNITY)
Admission: EM | Disposition: A | Discharge status: Home Health | Payer: Self-pay | Source: Home / Self Care | Attending: Internal Medicine

## 2021-06-24 ENCOUNTER — Encounter (HOSPITAL_COMMUNITY): Payer: Self-pay | Admitting: Internal Medicine

## 2021-06-24 DIAGNOSIS — M3390 Dermatopolymyositis, unspecified, organ involvement unspecified: Secondary | ICD-10-CM | POA: Diagnosis not present

## 2021-06-24 DIAGNOSIS — D500 Iron deficiency anemia secondary to blood loss (chronic): Secondary | ICD-10-CM

## 2021-06-24 DIAGNOSIS — J9611 Chronic respiratory failure with hypoxia: Secondary | ICD-10-CM | POA: Diagnosis not present

## 2021-06-24 DIAGNOSIS — D649 Anemia, unspecified: Secondary | ICD-10-CM | POA: Diagnosis not present

## 2021-06-24 DIAGNOSIS — J449 Chronic obstructive pulmonary disease, unspecified: Secondary | ICD-10-CM

## 2021-06-24 DIAGNOSIS — R140 Abdominal distension (gaseous): Secondary | ICD-10-CM

## 2021-06-24 DIAGNOSIS — K921 Melena: Secondary | ICD-10-CM

## 2021-06-24 HISTORY — PX: AGILE CAPSULE: SHX5420

## 2021-06-24 LAB — BASIC METABOLIC PANEL
Anion gap: 8 (ref 5–15)
BUN: 11 mg/dL (ref 8–23)
CO2: 30 mmol/L (ref 22–32)
Calcium: 8.3 mg/dL — ABNORMAL LOW (ref 8.9–10.3)
Chloride: 98 mmol/L (ref 98–111)
Creatinine, Ser: 1.21 mg/dL — ABNORMAL HIGH (ref 0.44–1.00)
GFR, Estimated: 49 mL/min — ABNORMAL LOW (ref >60)
Glucose, Bld: 148 mg/dL — ABNORMAL HIGH (ref 70–99)
Potassium: 4.2 mmol/L (ref 3.5–5.1)
Sodium: 136 mmol/L (ref 135–145)

## 2021-06-24 LAB — CBC
HCT: 21.8 % — ABNORMAL LOW (ref 36.0–46.0)
Hemoglobin: 6.3 g/dL — CL (ref 12.0–15.0)
MCH: 23.2 pg — ABNORMAL LOW (ref 26.0–34.0)
MCHC: 28.9 g/dL — ABNORMAL LOW (ref 30.0–36.0)
MCV: 80.1 fL (ref 80.0–100.0)
Platelets: 426 10*3/uL — ABNORMAL HIGH (ref 150–400)
RBC: 2.72 MIL/uL — ABNORMAL LOW (ref 3.87–5.11)
RDW: 17.9 % — ABNORMAL HIGH (ref 11.5–15.5)
WBC: 8.1 10*3/uL (ref 4.0–10.5)
nRBC: 0 % (ref 0.0–0.2)

## 2021-06-24 LAB — HEMOGLOBIN AND HEMATOCRIT, BLOOD
HCT: 24.3 % — ABNORMAL LOW (ref 36.0–46.0)
Hemoglobin: 7.5 g/dL — ABNORMAL LOW (ref 12.0–15.0)

## 2021-06-24 LAB — MRSA NEXT GEN BY PCR, NASAL: MRSA by PCR Next Gen: NOT DETECTED

## 2021-06-24 SURGERY — AGILE CAPSULE

## 2021-06-24 MED ORDER — DIPHENHYDRAMINE HCL 25 MG PO CAPS
25.0000 mg | ORAL_CAPSULE | Freq: Once | ORAL | Status: AC
  Administered 2021-06-24: 04:00:00 25 mg via ORAL
  Filled 2021-06-24: qty 1

## 2021-06-24 MED ORDER — IPRATROPIUM BROMIDE 0.02 % IN SOLN
2.5000 mL | Freq: Four times a day (QID) | RESPIRATORY_TRACT | Status: DC
Start: 2021-06-24 — End: 2021-06-25
  Administered 2021-06-24 – 2021-06-25 (×4): 0.5 mg via RESPIRATORY_TRACT
  Filled 2021-06-24 (×5): qty 2.5

## 2021-06-24 MED ORDER — BISACODYL 10 MG RE SUPP
10.0000 mg | Freq: Once | RECTAL | Status: DC
  Filled 2021-06-24: qty 1

## 2021-06-24 MED ORDER — SENNOSIDES-DOCUSATE SODIUM 8.6-50 MG PO TABS
1.0000 | ORAL_TABLET | Freq: Two times a day (BID) | ORAL | Status: DC
  Administered 2021-06-24 – 2021-06-27 (×7): 1 via ORAL
  Filled 2021-06-24 (×8): qty 1

## 2021-06-24 MED ORDER — DIPHENHYDRAMINE HCL 25 MG PO CAPS
25.0000 mg | ORAL_CAPSULE | Freq: Four times a day (QID) | ORAL | Status: DC | PRN
  Administered 2021-06-24 – 2021-06-26 (×3): 25 mg via ORAL
  Filled 2021-06-24 (×3): qty 1

## 2021-06-24 MED ORDER — GUAIFENESIN-DM 100-10 MG/5ML PO SYRP
5.0000 mL | ORAL_SOLUTION | ORAL | Status: DC | PRN
  Administered 2021-06-24 – 2021-06-26 (×3): 5 mL via ORAL
  Filled 2021-06-24 (×3): qty 5

## 2021-06-24 NOTE — Progress Notes (Signed)
Report called and given to [facility], RN on Dept. 300. Pt to be transported via [place].

## 2021-06-24 NOTE — Progress Notes (Signed)
Patient arrived on unit, transferred and brought up from ICU.

## 2021-06-24 NOTE — Progress Notes (Signed)
PROGRESS NOTE    Kayla Ryan  FIE:[id number] DOB: 02/28/1956 DOA: 06/23/2021 PCP: Guadlupe Spanish, MD    Chief Complaint  Patient presents with   Low Hemoglobin    Brief Narrative:  Patient 65 year old female history of atrial fibrillation, asthma, COPD with chronic respiratory failure, hypertension, Crohn's disease, chronic anemia, history of multiple transfusions was seen at PCPs office noted to have a low hemoglobin of 6.2 patient sent by EMS to the ED.  Patient noted to have weeks of generalized weakness, dyspnea on exertion.  Patient on presentation to the ED noted to have hemoglobin of 5.8.  Patient transfused 2 units packed red blood cells with posttransfusion H&H pending.   Assessment & Plan:   Principal Problem:   Acute on chronic anemia Active Problems:   Iron deficiency anemia   Asthma   Chronic obstructive pulmonary disease/Emphysema   Dermatomyositis (HCC)   Essential hypertension   Chronic respiratory failure with hypoxia (HCC)   Symptomatic anemia   Abdominal bloating  #1 acute on chronic anemia/iron deficiency anemia/symptomatic anemia -Patient presented with hemoglobin of 5.6 from last discharge of 05/03/2021 hemoglobin noted at 7.8. -Patient with prior history of multiple blood transfusions. -Patient did report to admitting physician had some coffee colored stools approximately a week ago. -Patient seen by GI during last hospitalization noted to have EGD and colonoscopy 04/30/2021 with no etiology of bleed outpatient capsule endoscopy was recommended however not done. -Patient currently n.p.o. pending GI evaluation. -Status post transfusion 2 units packed red blood cells with posttransfusion H&H pending. -Transfusion threshold hemoglobin < 7.  -May need IV iron during this hospitalization as anemia panel from prior hospitalization consistent with iron deficiency anemia. -Continue PPI IV every 12 hours. -GI consultation pending.  2.  Abdominal  bloating -Patient noted to have abdominal bloating on presentation however denies any further abdominal pain today. -KUB ordered with mildly dilated mid to lower abdominal small bowel segments could be due to ileus or low-grade partial small bowel obstruction.  Abdominal exam unremarkable. -We will give a Dulcolax suppository. -Repeat abdominal films in the a.m. -GI consulted.  3.  Mechanical fall -Patient noted to have a mechanical fall prior to admission with persistent right elbow pain and swelling, noted patient tripped over her oxygen cord. -CT head plain films of the right elbow with no acute abnormalities. -Pain management. -Supportive care.  4.  Dyspnea -Patient with complaints of dyspnea on exertion with a stable persistent cough since treatment for pneumonia in September with cephalosporin allergy. -Patient reported fevers at home currently afebrile with normal white count. -Chest x-ray done with minimal left base atelectasis versus airspace disease. -Reassess after patient has been transfused accordingly and hemoglobin above 7-8. -Hold off on antibiotics at this time. -Follow.  5.  COPD with chronic respiratory failure on 4 L nasal cannula -Stable. -Continue Singulair, PPI, Atrovent nebs.  6.  Dermatomyositis/chronic pain -Being followed at [facility] home regimen xtampza ER and as needed oxycodone.  7.  Paroxysmal atrial fibrillation -Currently normal sinus rhythm. -Continue Cardizem for rate control. -Not on anticoagulation due to recurrent anemia.  8.  Depression/anxiety -Continue home regimen duloxetine, doxepin, Ativan as needed.  9.  Hypertension -Continue home regimen Cardizem.   DVT prophylaxis: SCDs. Code Status: Full Family Communication: Updated patient.  No family at bedside. Disposition:   Status is: Observation        Consultants:  Gastroenterology pending  Procedures:  Transfusing 2 units packed red blood cells  06/23/2021-06/24/2021 Abdominal  films 06/23/2021 Plain films of the right elbow 06/23/2021 Chest x-ray 06/23/2021   Antimicrobials: None   Subjective: Laying up in bed.  Feels a little bit better than on admission.  Transfused 2 units packed red blood cells however posttransfusion CBC pending.  No chest pain.  States prior to admission was short of breath on minimal exertion however has not been out of the bed to assess whether that has improved.  No nausea or vomiting.  Hungry.  Objective: Vitals:   06/24/21 0818 06/24/21 0900 06/24/21 0915 06/24/21 1000  BP:      Pulse: 70 70 70 77  Resp: 12 11 10 11   Temp:      TempSrc:      SpO2: 98% 96% 98% 98%  Weight:      Height:        Intake/Output Summary (Last 24 hours) at 06/24/2021 1049 Last data filed at 06/24/2021 0650 Gross per 24 hour  Intake 1330 ml  Output --  Net 1330 ml   Filed Weights   06/23/21 2056  Weight: 84.8 kg    Examination:  General exam: Appears calm and comfortable.  Pale. Respiratory system: Some scattered coarse breath sounds otherwise clear.  No wheezing.  Fair air movement.  No crackles.  Speaking in full sentences.  Cardiovascular system: S1 & S2 heard, RRR. No JVD, murmurs, rubs, gallops or clicks. No pedal edema. Gastrointestinal system: Abdomen is nondistended, soft and nontender. No organomegaly or masses felt. Normal bowel sounds heard. Central nervous system: Alert and oriented. No focal neurological deficits. Extremities: Symmetric 5 x 5 power. Skin: No rashes, lesions or ulcers Psychiatry: Judgement and insight appear normal. Mood & affect appropriate.     Data Reviewed: I have personally reviewed following labs and imaging studies  CBC: Recent Labs  Lab 06/23/21 1559 06/24/21 0414 06/24/21 0945  WBC 9.6 8.1  --   HGB 5.8* 6.3* 7.5*  HCT 20.9* 21.8* 24.3*  MCV 79.2* 80.1  --   PLT 300 426*  --     Basic Metabolic Panel: Recent Labs  Lab 06/23/21 1559 06/24/21 0414   NA 135 136  K 3.5 4.2  CL 97* 98  CO2 26 30  GLUCOSE 165* 148*  BUN 8 11  CREATININE 1.11* 1.21*  CALCIUM 8.8* 8.3*    GFR: Estimated Creatinine Clearance: 52.4 mL/min (A) (by C-G formula based on SCr of 1.21 mg/dL (H)).  Liver Function Tests: Recent Labs  Lab 06/23/21 1559  AST 25  ALT 14  ALKPHOS 96  BILITOT 0.5  PROT 7.7  ALBUMIN 4.1    CBG: No results for input(s): GLUCAP in the last 168 hours.   Recent Results (from the past 240 hour(s))  Resp Panel by RT-PCR (Flu A&B, Covid) Nasopharyngeal Swab     Status: None   Collection Time: 06/23/21  6:20 PM   Specimen: Nasopharyngeal Swab; Nasopharyngeal(NP) swabs in vial transport medium  Result Value Ref Range Status   SARS Coronavirus 2 by RT PCR NEGATIVE NEGATIVE Final    Comment: (NOTE) SARS-CoV-2 target nucleic acids are NOT DETECTED.  The SARS-CoV-2 RNA is generally detectable in upper respiratory specimens during the acute phase of infection. The lowest concentration of SARS-CoV-2 viral copies this assay can detect is 138 copies/mL. A negative result does not preclude SARS-Cov-2 infection and should not be used as the sole basis for treatment or other patient management decisions. A negative result may occur with  improper specimen collection/handling, submission of specimen other than  nasopharyngeal swab, presence of viral mutation(s) within the areas targeted by this assay, and inadequate number of viral copies(<138 copies/mL). A negative result must be combined with clinical observations, patient history, and epidemiological information. The expected result is Negative.  Fact Sheet for Patients:  EntrepreneurPulse.com.au  Fact Sheet for Healthcare Providers:  IncredibleEmployment.be  This test is no t yet approved or cleared by the [place] FDA and  has been authorized for detection and/or diagnosis of SARS-CoV-2 by FDA under an Emergency Use Authorization  (EUA). This EUA will remain  in effect (meaning this test can be used) for the duration of the COVID-19 declaration under Section 564(b)(1) of the Act, 21 U.S.C.section 360bbb-3(b)(1), unless the authorization is terminated  or revoked sooner.       Influenza A by PCR NEGATIVE NEGATIVE Final   Influenza B by PCR NEGATIVE NEGATIVE Final    Comment: (NOTE) The Xpert Xpress SARS-CoV-2/FLU/RSV plus assay is intended as an aid in the diagnosis of influenza from Nasopharyngeal swab specimens and should not be used as a sole basis for treatment. Nasal washings and aspirates are unacceptable for Xpert Xpress SARS-CoV-2/FLU/RSV testing.  Fact Sheet for Patients: EntrepreneurPulse.com.au  Fact Sheet for Healthcare Providers: IncredibleEmployment.be  This test is not yet approved or cleared by the [place] FDA and has been authorized for detection and/or diagnosis of SARS-CoV-2 by FDA under an Emergency Use Authorization (EUA). This EUA will remain in effect (meaning this test can be used) for the duration of the COVID-19 declaration under Section 564(b)(1) of the Act, 21 U.S.C. section 360bbb-3(b)(1), unless the authorization is terminated or revoked.  Performed at [facility], [address], [place], [place] [address]   MRSA Next Gen by PCR, Nasal     Status: None   Collection Time: 06/23/21  8:40 PM   Specimen: Nasal Mucosa; Nasal Swab  Result Value Ref Range Status   MRSA by PCR Next Gen NOT DETECTED NOT DETECTED Final    Comment: (NOTE) The GeneXpert MRSA Assay (FDA approved for NASAL specimens only), is one component of a comprehensive MRSA colonization surveillance program. It is not intended to diagnose MRSA infection nor to guide or monitor treatment for MRSA infections. Test performance is not FDA approved in patients less than 38 years old. Performed at [facility], [address], [place], [place] [address]           Radiology Studies: DG Elbow 2 Views Right  Result Date: 06/23/2021 CLINICAL DATA:  Pain, swelling posterior elbow EXAM: RIGHT ELBOW - 2 VIEW COMPARISON:  06/06/2021 FINDINGS: There is no evidence of fracture, dislocation, or joint effusion. There is no evidence of arthropathy or other focal bone abnormality. Soft tissues are unremarkable. IMPRESSION: Negative. Electronically Signed   By: Rolm Baptise M.D.   On: 06/23/2021 19:53   DG Abd 1 View  Result Date: 06/24/2021 CLINICAL DATA:  Crohn's disease with abdominal pain and bloating. EXAM: ABDOMEN - 1 VIEW COMPARISON:  Abdomen series 06/07/2019 FINDINGS: There are mildly dilated small bowel loops in left mid and lower abdomen up to 3.5 cm caliber. Bowel gas and stool noted in the large intestine at least to the transverse segment. There are cholecystectomy clips. No pathologic calcifications are identified. Visceral shadows are stable. Lung bases are clear. IMPRESSION: Mildly dilated mid to lower abdominal small bowel segments, which could be due to an ileus or low-grade partial small bowel obstruction. Follow-up recommended. Electronically Signed   By: Telford Nab M.D.   On: 06/24/2021  00:01   DG Chest Portable 1 View  Result Date: 06/23/2021 CLINICAL DATA:  Shortness of breath. EXAM: PORTABLE CHEST 1 VIEW COMPARISON:  Chest x-ray 06/06/2021. FINDINGS: There are minimal left basilar opacities. The lungs are otherwise clear. No pleural effusion or pneumothorax. Cardiomediastinal silhouette within normal limits. No acute fractures. IMPRESSION: 1. Minimal left basilar atelectasis/airspace disease. Electronically Signed   By: Ronney Asters M.D.   On: 06/23/2021 16:25        Scheduled Meds:  acyclovir  400 mg Oral BID   aspirin EC  81 mg Oral Daily   bisacodyl  10 mg Rectal Once   Chlorhexidine Gluconate Cloth  6 each Topical Daily   diltiazem  120 mg Oral Daily   doxepin  10 mg Oral QHS   DULoxetine  60 mg Oral BID   gabapentin   800 mg Oral TID   gemfibrozil  600 mg Oral BID AC   ipratropium  2.5 mL Inhalation Q6H   loratadine  10 mg Oral Daily   magnesium hydroxide  15 mL Oral QHS   montelukast  10 mg Oral Daily   oxyCODONE  15 mg Oral Q12H   pantoprazole (PROTONIX) IV  40 mg Intravenous Q12H   Continuous Infusions:   LOS: 0 days    Time spent: 40 minutes    Irine Seal, MD [facility]   To contact the attending provider between 7A-7P or the covering provider during after hours 7P-7A, please log into the web site [email or web address] and access using universal [place] password for that web site. If you do not have the password, please call the hospital operator.  06/24/2021, 10:49 AM

## 2021-06-24 NOTE — Evaluation (Signed)
Physical Therapy Evaluation Patient Details Name: Kayla Ryan MRN: [id number] DOB: 12/24/1955 Today's Date: 06/24/2021  History of Present Illness  Kayla Ryan is a 65 y.o. female with medical history significant for atrial fibrillation, asthma, COPD with chronic respiratory failure, hypertension, Crohn's disease, chronic anemia,  Multiple transfusions.  Patient was sent to the ED by outpatient provider reports of low hemoglobin of 6.2.  Patient came in by EMS.  Patient reports over the past few days to weeks, she has been weak.  She has had difficulty breathing with exertion but at rest she is okay.  She denies abdominal pain. Patient reports having 8 episodes of coffee colored stool in a day about a week ago.  She denies vomiting of blood or blood in stools.   She also reports fever at home up to 99, and another time up to 100.  She reports since her hospitalization in September she has had a persistent cough which is unchanged.  Cough is now dry.   She reports base line 3 -4 stools daily, and reports right lower quadrant abdominal pain with bloating today.   Clinical Impression  Patient functioning near baseline for functional mobility and gait other than c/o right elbows and knee pain due to recent fall.  Patient demonstrates good return for bed mobility and completing sit to stands, transfers without AD, required occasional hand held assist during ambulation mostly due to difficulty pushing O2 tank holder and discomfort in right elbow.  Patient tolerated sitting up in wheelchair to be transported to medical floor - RN aware.  Patient will benefit from continued physical therapy in hospital and recommended venue below to increase strength, balance, endurance for safe ADLs and gait.         Recommendations for follow up therapy are one component of a multi-disciplinary discharge planning process, led by the attending physician.  Recommendations may be updated based on patient status, additional  functional criteria and insurance authorization.  Follow Up Recommendations Home health PT    Assistance Recommended at Discharge PRN  Functional Status Assessment Patient has had a recent decline in their functional status and demonstrates the ability to make significant improvements in function in a reasonable and predictable amount of time.  Equipment Recommendations  Rolling walker (2 wheels)    Recommendations for Other Services       Precautions / Restrictions Precautions Precautions: Fall Restrictions Weight Bearing Restrictions: No      Mobility  Bed Mobility Overal bed mobility: Modified Independent                  Transfers Overall transfer level: Needs assistance Equipment used: 1 person hand held assist Transfers: Sit to/from Stand;Bed to chair/wheelchair/BSC Sit to Stand: Supervision Stand pivot transfers: Supervision         General transfer comment: slightly labored movement with c/o mild pain in right knee    Ambulation/Gait Ambulation/Gait assistance: Supervision;Min guard Gait Distance (Feet): 40 Feet Assistive device: 1 person hand held assist (pushing O2 roller) Gait Pattern/deviations: Decreased step length - right;Decreased step length - left;Decreased stride length Gait velocity: decreased     General Gait Details: slow labored cadence with diffiuclty holding onto O2 roller due to c/o increased pain in right elbow, required occasional hand held assist due to decreased balance  Stairs            Wheelchair Mobility    Modified Rankin (Stroke Patients Only)       Balance Overall balance assessment: Needs  assistance Sitting-balance support: Feet supported;No upper extremity supported Sitting balance-Leahy Scale: Good Sitting balance - Comments: seated at EOB   Standing balance support: During functional activity;No upper extremity supported Standing balance-Leahy Scale: Fair Standing balance comment: fair/good static  standing balance, fair dynamic standing balance                             Pertinent Vitals/Pain Pain Assessment: Faces Faces Pain Scale: Hurts little more Pain Location: right elbow and knee Pain Descriptors / Indicators: Sore;Discomfort Pain Intervention(s): Limited activity within patient's tolerance;Monitored during session    Home Living Family/patient expects to be discharged to:: Private residence Living Arrangements: Alone Available Help at Discharge: Neighbor;Available PRN/intermittently Type of Home: Apartment Home Access: Level entry       Home Layout: One level Home Equipment: Cane - single point      Prior Function Prior Level of Function : Independent/Modified Independent             Mobility Comments: household and short distanced community ambulator using [facility] ADLs Comments: assisted by family and friends     Hand Dominance        Extremity/Trunk Assessment   Upper Extremity Assessment Upper Extremity Assessment: Defer to OT evaluation    Lower Extremity Assessment Lower Extremity Assessment: Overall WFL for tasks assessed    Cervical / Trunk Assessment Cervical / Trunk Assessment: Normal  Communication   Communication: No difficulties  Cognition Arousal/Alertness: Awake/alert Behavior During Therapy: WFL for tasks assessed/performed Overall Cognitive Status: Within Functional Limits for tasks assessed                                          General Comments      Exercises     Assessment/Plan    PT Assessment Patient needs continued PT services  PT Problem List Decreased strength;Decreased activity tolerance;Decreased balance;Decreased mobility       PT Treatment Interventions DME instruction;Stair training;Functional mobility training;Therapeutic activities;Therapeutic exercise;Balance training;Patient/family education;Gait training    PT Goals (Current goals can be found in the Care Plan section)   Acute Rehab PT Goals Patient Stated Goal: return home with family/friends to assist PT Goal Formulation: With patient Time For Goal Achievement: 06/28/21 Potential to Achieve Goals: Good    Frequency Min 2X/week   Barriers to discharge        Co-evaluation               AM-PAC PT "6 Clicks" Mobility  Outcome Measure Help needed turning from your back to your side while in a flat bed without using bedrails?: None Help needed moving from lying on your back to sitting on the side of a flat bed without using bedrails?: None Help needed moving to and from a bed to a chair (including a wheelchair)?: A Little Help needed standing up from a chair using your arms (e.g., wheelchair or bedside chair)?: A Little Help needed to walk in hospital room?: A Little Help needed climbing 3-5 steps with a railing? : A Lot 6 Click Score: 19    End of Session Equipment Utilized During Treatment: Oxygen Activity Tolerance: Patient tolerated treatment well;Patient limited by fatigue Patient left: in chair;with call bell/phone within reach Nurse Communication: Mobility status PT Visit Diagnosis: Unsteadiness on feet (R26.81);Other abnormalities of gait and mobility (R26.89);Muscle weakness (generalized) (M62.81)  Time: 7824-2353 PT Time Calculation (min) (ACUTE ONLY): 22 min   Charges:   PT Evaluation $PT Eval Moderate Complexity: 1 Mod PT Treatments $Therapeutic Activity: 8-22 mins        2:40 PM, 06/24/21 Lonell Grandchild, MPT Physical Therapist with [facility] [phone] office [phone] mobile phone

## 2021-06-24 NOTE — Clinical Social Work Note (Signed)
LVM to inquire about HH with sister. Patient not fully oriented.

## 2021-06-24 NOTE — Consult Note (Signed)
Referring Provider: Eugenie Filler, MD Primary Care Physician:  Guadlupe Spanish, MD Primary Gastroenterologist:  Dr. Darlyne Russian with [facility] (seen in [facility] clinic)  Reason for Consultation:  symptomatic anemia/IDA  HPI: Kayla Ryan is a 65 y.o. female with PMH significant for COPD with chronic respiratory failure on 3L of oxygen, dermatomyositis, Bell's palsy, GERD, ?Crohn's disease, HTN, CHF, Afib, recent admission 04/2021 for symptomatic anemia presenting to ED with shortness of breath and low hemoglobin of 6.2.  Patient seen during admission in 04/2021, hemoglobin on admission 5.3 at that time.  Iron studies consistent with IDA.  B12 and folate normal.  Baseline hemoglobin has been in the upper 7 range.  Patient reported previously being followed by hematology for IV iron but has not been seen in over 5 years.  Reported that she required "special mix" of small doses in order to tolerate.  She gives a personal history of Crohn's in her 62s. Was in the hospital several times. Took Cortenemas for awhile. More recent colonoscopies without evidence of Crohn's.  She has not required any maintenance medication.  Completed EGD and colonoscopy during last admission (September 2022) with no significant findings.  See details below.  Celiac serologies recently negative.  Received 2 units of blood last admission.  After first unit her hemoglobin is up to 6.3.  This admission: Presented with hemoglobin of 5.8, hematocrit 20.9, MCV 79.2, normal white blood cell count and platelets.  Today her hemoglobin is 6.3.  At time of discharge on September 26 her hemoglobin was 7.8.  BUN 8, creatinine 1.11.  Received 2 units of packed red blood cells overnight, abdominal imaging showing mildly dilated small bowel loops in the left mid and lower abdomen up to 3.5 cm in caliber.  Bowel gas and stool noted in the large bowel.  Could be due to ileus versus partial small bowel  obstruction.  Patient reports 8 episodes of coffee-colored stool in 24 hours but this occurred 1 week ago.  At baseline typically has 3-4 stools per day. Has generalized abdominal pain, central abdomen, especially around the umbilicus. No N/V. Heartburn reasonably well controlled. Last admission she reported dysphagia to everything but scared to get her esophagus stretched. She has had it stretched once. Family member had it done several times and developed "extra tissue and she aspirated". She states her pain management provider handles all of her issues but she is interested in seeking a new PCP. She is having trouble traveling to [facility] and would also like to establish care with local GI.    Previous pertinent work up:  Colonoscopy September 2022: -Diverticulosis in the sigmoid colon -Redundant and elongated colon  EGD September 2022: - Normal esophagus. Status post esophageal dilation - Normal stomach. - Normal duodenal bulb, second portion of the duodenum and third portion of the duodenum.  CT A/P 07/2020 IMPRESSION: 1. Short segment of prominent fluid-filled small bowel with air-fluid levels in the left upper quadrant, may represent enteritis or focal ileus. 2. Distal colonic diverticulosis without diverticulitis. 3. Distended urinary bladder without wall thickening.   CT abdomen pelvis with contrast January 2020 at [place]:  Moderate fluid in the right side of the colon. Otherwise, benign  appearing abdomen and pelvis.      EGD/colonoscopy October 2015 Pathology: Esophageal biopsies unremarkable, duodenal biopsies unremarkable with no evidence of celiac disease, descending colon biopsy showed tubular adenoma.   Copied from Dr. Mirian Capuchin  H. Le's note from 05/2017  COLONOSCOPY 2006  incomplete colonoscopy to the mid transverse colon; mucosal changes consistent with Crohn's colitis in remission; Dr. Harrell Lark   COLONOSCOPY 2008   diverticulosis No neoplasia, normal ileoscopy; ileal and cecal biopsy: melanosis coli, normal ileal mucosa, no inflammation   COLONOSCOPY 2012  diverticulosis internal hemorrhoids - medium   ESOPHAGOGASTRODUODENOSCOPY 2012  Gastric fundic gland polyp; hiatal hernia-small, non inflamed, Helicobacter negative      Prior to Admission medications   Medication Sig Start Date End Date Taking? Authorizing Provider  acyclovir (ZOVIRAX) 400 MG tablet Take 400 mg by mouth 2 (two) times daily. 05/18/21  Yes [provider]  albuterol (PROVENTIL) (2.5 MG/3ML) 0.083% nebulizer solution Take 2.5 mg by nebulization every 6 (six) hours as needed for wheezing or shortness of breath.   Yes [provider]  aspirin (ASPIRIN 81) 81 MG EC tablet Take 1 tablet (81 mg total) by mouth daily. Swallow whole. 05/03/21  Yes Tat, Shanon Brow, MD  cetirizine (ZYRTEC) 10 MG tablet Take 10 mg by mouth daily.   Yes [provider]  diltiazem (CARDIZEM CD) 120 MG 24 hr capsule Take 1 capsule (120 mg total) by mouth daily. 05/03/21 05/03/22 Yes Tat, Shanon Brow, MD  doxepin (SINEQUAN) 10 MG capsule Take 10 mg by mouth at bedtime. 05/09/21  Yes [provider]  DULoxetine (CYMBALTA) 60 MG capsule Take 1 capsule (60 mg total) by mouth 2 (two) times daily. 07/26/20  Yes Emokpae, Courage, MD  folic acid (FOLVITE) 1 MG tablet Take 1 tablet (1 mg total) by mouth daily. 07/27/20  Yes Emokpae, Courage, MD  gabapentin (NEURONTIN) 800 MG tablet Take 800 mg by mouth 3 (three) times daily. 06/08/21  Yes [provider]  gemfibrozil (LOPID) 600 MG tablet Take 600 mg by mouth 2 (two) times daily before a meal.   Yes [provider]  ipratropium (ATROVENT HFA) 17 MCG/ACT inhaler Inhale 2 puffs into the lungs every 6 (six) hours.   Yes [provider]  LORazepam (ATIVAN) 0.5 MG tablet Take 1 tablet (0.5 mg total) by mouth 3 (three) times daily as needed for anxiety. 08/19/20  Yes Shah, Pratik D, DO   magnesium hydroxide (MILK OF MAGNESIA) 400 MG/5ML suspension Take 15 mLs by mouth at bedtime.   Yes [provider]  montelukast (SINGULAIR) 10 MG tablet Take 10 mg by mouth daily. 02/23/21  Yes [provider]  NITROSTAT 0.4 MG SL tablet Place 0.4 mg under the tongue every 15 (fifteen) minutes as needed for chest pain.  09/22/13  Yes [provider]  omeprazole (PRILOSEC) 40 MG capsule Take 40 mg by mouth 2 (two) times daily. 05/13/21  Yes [provider]  ondansetron (ZOFRAN-ODT) 4 MG disintegrating tablet Take 2 mg by mouth every 6 (six) hours as needed. 12/18/20  Yes [provider]  oxyCODONE (OXY IR/ROXICODONE) 5 MG immediate release tablet Take 1 tablet (5 mg total) by mouth every 4 (four) hours as needed for moderate pain or severe pain. 08/19/20  Yes Shah, Pratik D, DO  Tiotropium Bromide-Olodaterol (STIOLTO RESPIMAT) 2.5-2.5 MCG/ACT AERS Inhale 2 puffs into the lungs daily. 12/07/20  Yes Collene Gobble, MD  XTAMPZA ER 18 MG C12A Take 1 capsule by mouth 2 (two) times daily. 08/19/20  Yes Shah, Pratik D, DO  BELSOMRA 20 MG TABS Take 1 tablet by mouth at bedtime as needed. Patient not taking: Reported on 06/23/2021 04/13/21   [provider]  doxycycline (VIBRA-TABS) 100 MG  tablet Take 1 tablet (100 mg total) by mouth 2 (two) times daily. Patient not taking: No sig reported 05/14/21   Sherrilyn Rist A, MD  gabapentin (NEURONTIN) 300 MG capsule Take 300 mg by mouth 3 (three) times daily. Patient not taking: Reported on 06/23/2021    [provider]  pantoprazole (PROTONIX) 40 MG tablet Take 1 tablet (40 mg total) by mouth 2 (two) times daily. Patient not taking: No sig reported 05/03/21   Tat, Shanon Brow, MD  valACYclovir (VALTREX) 500 MG tablet Take 500 mg by mouth 2 (two) times daily. Patient not taking: Reported on 06/23/2021    [provider]    Current Facility-Administered Medications  Medication Dose Route Frequency  Provider Last Rate Last Admin   acetaminophen (TYLENOL) tablet 650 mg  650 mg Oral Q6H PRN Emokpae, Ejiroghene E, MD   650 mg at 06/24/21 0345   Or   acetaminophen (TYLENOL) suppository 650 mg  650 mg Rectal Q6H PRN Emokpae, Ejiroghene E, MD       acyclovir (ZOVIRAX) tablet 400 mg  400 mg Oral BID Emokpae, Ejiroghene E, MD   400 mg at 06/24/21 0808   albuterol (PROVENTIL) (2.5 MG/3ML) 0.083% nebulizer solution 2.5 mg  2.5 mg Nebulization Q6H PRN Emokpae, Ejiroghene E, MD       aspirin EC tablet 81 mg  81 mg Oral Daily Emokpae, Ejiroghene E, MD   81 mg at 06/24/21 0809   Chlorhexidine Gluconate Cloth 2 % PADS 6 each  6 each Topical Daily Emokpae, Ejiroghene E, MD   6 each at 06/24/21 0811   diltiazem (CARDIZEM CD) 24 hr capsule 120 mg  120 mg Oral Daily Emokpae, Ejiroghene E, MD   120 mg at 06/24/21 0809   doxepin (SINEQUAN) capsule 10 mg  10 mg Oral QHS Emokpae, Ejiroghene E, MD   10 mg at 06/23/21 2257   DULoxetine (CYMBALTA) DR capsule 60 mg  60 mg Oral BID Emokpae, Ejiroghene E, MD   60 mg at 06/24/21 0808   gabapentin (NEURONTIN) capsule 800 mg  800 mg Oral TID Emokpae, Ejiroghene E, MD   800 mg at 06/24/21 0809   gemfibrozil (LOPID) tablet 600 mg  600 mg Oral BID AC Emokpae, Ejiroghene E, MD   600 mg at 06/24/21 0807   ipratropium (ATROVENT) nebulizer solution 0.5 mg  2.5 mL Inhalation Q6H Emokpae, Ejiroghene E, MD   0.5 mg at 06/24/21 0817   loratadine (CLARITIN) tablet 10 mg  10 mg Oral Daily Emokpae, Ejiroghene E, MD   10 mg at 06/24/21 0808   LORazepam (ATIVAN) tablet 0.5 mg  0.5 mg Oral TID PRN Emokpae, Ejiroghene E, MD   0.5 mg at 06/24/21 0816   magnesium hydroxide (MILK OF MAGNESIA) suspension 15 mL  15 mL Oral QHS Emokpae, Ejiroghene E, MD   15 mL at 06/23/21 2220   montelukast (SINGULAIR) tablet 10 mg  10 mg Oral Daily Emokpae, Ejiroghene E, MD   10 mg at 06/23/21 2304   ondansetron (ZOFRAN) tablet 4 mg  4 mg Oral Q6H PRN Emokpae, Ejiroghene E, MD       Or   ondansetron (ZOFRAN)  injection 4 mg  4 mg Intravenous Q6H PRN Emokpae, Ejiroghene E, MD       oxyCODONE (Oxy IR/ROXICODONE) immediate release tablet 5 mg  5 mg Oral Q4H PRN Emokpae, Ejiroghene E, MD   5 mg at 06/23/21 2030   oxyCODONE (OXYCONTIN) 12 hr tablet 15 mg  15 mg Oral Q12H Emokpae,  Ejiroghene E, MD   15 mg at 06/24/21 0809   pantoprazole (PROTONIX) injection 40 mg  40 mg Intravenous Q12H Emokpae, Ejiroghene E, MD   40 mg at 06/24/21 0807   polyethylene glycol (MIRALAX / GLYCOLAX) packet 17 g  17 g Oral Daily PRN Emokpae, Ejiroghene E, MD        Allergies as of 06/23/2021 - Review Complete 06/23/2021  Allergen Reaction Noted   Sulfa antibiotics Shortness Of Breath, Swelling, and Rash 06/08/2011   Dilaudid [hydromorphone hcl]  10/31/2013   Iron  08/31/2020   Metronidazole Diarrhea and Nausea And Vomiting 03/03/2015   Prednisone Other (See Comments) 03/03/2015   Cephalexin Diarrhea and Nausea And Vomiting 03/03/2015   Methotrexate derivatives Swelling and Rash 06/08/2011   Morphine and related Anxiety 11/07/2013    Past Medical History:  Diagnosis Date   Anemia    Arthritis    Asthma    Chest pain    Chronic bronchitis    Congestive heart failure (CHF) (HCC)    COPD (chronic obstructive pulmonary disease) ([place])    Crohn disease ([place])    Emphysema    Emphysema of lung (HCC)    Fibromyalgia    Herpes    Hyperlipidemia    IBS (irritable bowel syndrome)    Kidney failure    Migraines    Muscular dystrophy ([place])    Neck pain    Plantar fasciitis    Polymyositis ([place])    Scoliosis     Past Surgical History:  Procedure Laterality Date   ABDOMINAL HYSTERECTOMY     bone spur     CHOLECYSTECTOMY     COLONOSCOPY WITH PROPOFOL N/A 04/30/2021   Procedure: COLONOSCOPY WITH PROPOFOL;  Surgeon: Daneil Dolin, MD;  Location: [facility];  Service: Endoscopy;  Laterality: N/A;   ESOPHAGEAL DILATION N/A 04/30/2021   Procedure: ESOPHAGEAL DILATION;  Surgeon: Daneil Dolin, MD;  Location: [facility];  Service: Endoscopy;  Laterality: N/A;   ESOPHAGOGASTRODUODENOSCOPY (EGD) WITH PROPOFOL N/A 04/30/2021   Procedure: ESOPHAGOGASTRODUODENOSCOPY (EGD) WITH PROPOFOL;  Surgeon: Daneil Dolin, MD;  Location: [facility];  Service: Endoscopy;  Laterality: N/A;   HERNIA REPAIR     LEFT HEART CATHETERIZATION WITH CORONARY ANGIOGRAM N/A 11/07/2013   Procedure: LEFT HEART CATHETERIZATION WITH CORONARY ANGIOGRAM;  Surgeon: Lorretta Harp, MD;  Location: [facility] CATH LAB;  Service: Cardiovascular;  Laterality: N/A;   MASTECTOMY PARTIAL / LUMPECTOMY     OOPHORECTOMY     ROTATOR CUFF REPAIR      Family History  Problem Relation Age of Onset   Lung cancer Mother    COPD Father    Lung cancer Father    Lymphoma Father    Anxiety disorder Sister    Depression Brother     Social History   Socioeconomic History   Marital status: Widowed    Spouse name: Not on file   Number of children: Not on file   Years of education: Not on file   Highest education level: Not on file  Occupational History   Not on file  Tobacco Use   Smoking status: Former    Packs/day: 1.00    Years: 49.00    Pack years: 49.00    Types: Cigarettes    Start date: 08/08/1966    Quit date: 08/31/2005    Years since quitting: 15.8   Smokeless tobacco: Never  Vaping Use   Vaping Use: Never used  Substance and Sexual Activity  Alcohol use: No   Drug use: No    Comment: used marijuana in teens   Sexual activity: Not Currently    Birth control/protection: Surgical  Other Topics Concern   Not on file  Social History Narrative   Not on file   Social Determinants of Health   Financial Resource Strain: Not on file  Food Insecurity: Not on file  Transportation Needs: Not on file  Physical Activity: Not on file  Stress: Not on file  Social Connections: Not on file  Intimate Partner Violence: Not on file     ROS:  General: Negative for anorexia, weight loss, fever, chills, fatigue, positive weakness. Eyes:  Negative for vision changes.  ENT: Negative for hoarseness, difficulty swallowing , nasal congestion. CV: Negative for chest pain, angina, palpitations, +dyspnea on exertion, peripheral edema.  Respiratory: Negative for dyspnea at rest, +dyspnea on exertion, cough, sputum, wheezing.  GI: See history of present illness. GU:  Negative for dysuria, hematuria, urinary incontinence, urinary frequency, nocturnal urination.  MS: +for joint pain, low back pain.  Derm: Negative for rash or itching.  Neuro: Negative for weakness, abnormal sensation, seizure, frequent headaches, memory loss, confusion.  Psych: Negative for anxiety, depression, suicidal ideation, hallucinations.  Endo: Negative for unusual weight change.  Heme: Negative for bruising or bleeding. Allergy: Negative for rash or hives.       Physical Examination: Vital signs in last 24 hours: Temp:  [97.5 F (36.4 C)-99.5 F (37.5 C)] 98 F (36.7 C) (11/17 0813) Pulse Rate:  [68-85] 70 (11/17 0900) Resp:  [11-20] 11 (11/17 0900) BP: (117-185)/(34-150) 160/66 (11/17 0813) SpO2:  [96 %-100 %] 96 % (11/17 0900) Weight:  [84.8 kg] 84.8 kg (11/16 2056) Last BM Date: 06/23/21  General: Well-nourished, well-developed in no acute distress.  Head: Normocephalic, atraumatic.   Eyes: Conjunctiva pink, no icterus. Mouth: Oropharyngeal mucosa moist and pink , no lesions erythema or exudate. Neck: Supple without thyromegaly, masses, or lymphadenopathy.  Lungs: Clear to auscultation bilaterally.  Heart: Regular rate and rhythm, no murmurs rubs or gallops.  Abdomen: Bowel sounds are normal, mild generalized tenderness, small umb hernia easily reducible, slight distention.no hepatosplenomegaly or masses, no abdominal bruits, no rebound or guarding.   Rectal: not performed Extremities: No lower extremity edema, clubbing, deformity.  Neuro: Alert and oriented x 4 , grossly normal neurologically.  Skin: Warm and dry, no rash or jaundice.    Psych: Alert and cooperative, normal mood and affect.        Intake/Output from previous day: 11/16 0701 - 11/17 0700 In: 1330 [P.O.:700; Blood:630] Out: -  Intake/Output this shift: No intake/output data recorded.  Lab Results: CBC Recent Labs    06/23/21 1559 06/24/21 0414  WBC 9.6 8.1  HGB 5.8* 6.3*  HCT 20.9* 21.8*  MCV 79.2* 80.1  PLT 300 426*   BMET Recent Labs    06/23/21 1559 06/24/21 0414  NA 135 136  K 3.5 4.2  CL 97* 98  CO2 26 30  GLUCOSE 165* 148*  BUN 8 11  CREATININE 1.11* 1.21*  CALCIUM 8.8* 8.3*   LFT Recent Labs    06/23/21 1559  BILITOT 0.5  ALKPHOS 96  AST 25  ALT 14  PROT 7.7  ALBUMIN 4.1    Lipase No results for input(s): LIPASE in the last 72 hours.  PT/INR No results for input(s): LABPROT, INR in the last 72 hours.    Imaging Studies: DG Chest 2 View  Result Date: 06/06/2021 CLINICAL DATA:  Fall.  Knee pain EXAM: CHEST - 2 VIEW COMPARISON:  Chest CT 04/29/2021 FINDINGS: Normal cardiac silhouette. Small focus of airspace disease in the medial RIGHT lower lobe similar to comparison CT. No pneumothorax. No pulmonary contusion. no pleural fluid. No fracture. IMPRESSION: 1. No evidence of thoracic trauma. 2. Persistent/chronic/recurrent airspace disease in the RIGHT lower lobe. Electronically Signed   By: Suzy Bouchard M.D.   On: 06/06/2021 07:06   DG Elbow 2 Views Right  Result Date: 06/23/2021 CLINICAL DATA:  Pain, swelling posterior elbow EXAM: RIGHT ELBOW - 2 VIEW COMPARISON:  06/06/2021 FINDINGS: There is no evidence of fracture, dislocation, or joint effusion. There is no evidence of arthropathy or other focal bone abnormality. Soft tissues are unremarkable. IMPRESSION: Negative. Electronically Signed   By: Rolm Baptise M.D.   On: 06/23/2021 19:53   DG Elbow 2 Views Right  Result Date: 06/06/2021 CLINICAL DATA:  Fall EXAM: RIGHT ELBOW - 2 VIEW COMPARISON:  None. FINDINGS: No evidence of fracture of the ulna or humerus.  The radial head is normal. No joint effusion. IMPRESSION: No fracture or dislocation. Electronically Signed   By: Suzy Bouchard M.D.   On: 06/06/2021 08:14   DG Elbow Complete Right  Result Date: 06/06/2021 CLINICAL DATA:  Fall last night with right elbow pain EXAM: RIGHT ELBOW - COMPLETE 3+ VIEW COMPARISON:  10/31/2019 FINDINGS: There is no evidence of fracture, dislocation, or joint effusion. There is no evidence of arthropathy or other focal bone abnormality. Soft tissues are unremarkable. IMPRESSION: Negative. Electronically Signed   By: Jorje Guild M.D.   On: 06/06/2021 07:02   DG Abd 1 View  Result Date: 06/24/2021 CLINICAL DATA:  Crohn's disease with abdominal pain and bloating. EXAM: ABDOMEN - 1 VIEW COMPARISON:  Abdomen series 06/07/2019 FINDINGS: There are mildly dilated small bowel loops in left mid and lower abdomen up to 3.5 cm caliber. Bowel gas and stool noted in the large intestine at least to the transverse segment. There are cholecystectomy clips. No pathologic calcifications are identified. Visceral shadows are stable. Lung bases are clear. IMPRESSION: Mildly dilated mid to lower abdominal small bowel segments, which could be due to an ileus or low-grade partial small bowel obstruction. Follow-up recommended. Electronically Signed   By: Telford Nab M.D.   On: 06/24/2021 00:01   CT Head Wo Contrast  Result Date: 06/06/2021 CLINICAL DATA:  Fall with head injury.  Initial encounter. EXAM: CT HEAD WITHOUT CONTRAST CT CERVICAL SPINE WITHOUT CONTRAST TECHNIQUE: Multidetector CT imaging of the head and cervical spine was performed following the standard protocol without intravenous contrast. Multiplanar CT image reconstructions of the cervical spine were also generated. COMPARISON:  12/12/2020 head CT FINDINGS: CT HEAD FINDINGS Brain: No evidence of acute infarction, hemorrhage, hydrocephalus, extra-axial collection or mass lesion/mass effect. Vascular: No hyperdense vessel or  unexpected calcification. Skull: Left-sided scalp swelling.  No acute fracture Sinuses/Orbits: No evidence of injury CT CERVICAL SPINE FINDINGS Alignment: No traumatic malalignment. Degenerative reversal of cervical lordosis.Mild C4-5 anterolisthesis, degenerative appearing. Skull base and vertebrae: No acute fracture. Soft tissues and spinal canal: No prevertebral fluid or swelling. No visible canal hematoma. 17 mm right thyroid nodule which has enlarged since 2021. Accentuated supraclavicular fat and nodes, benign appearing given stability. Disc levels: Asymmetric left facet osteoarthritis with moderate to bulky spurring. Upper chest: Negative IMPRESSION: 1. No evidence of acute intracranial or cervical spine injury. 2. Left scalp contusion without calvarial fracture. 3. 17 mm right thyroid nodule which has enlarged since 2021.  Recommend thyroid US (ref: J Am Coll Radiol. 2015 Feb;12(2): 143-50). Electronically Signed   By: Jorje Guild M.D.   On: 06/06/2021 06:59   CT Cervical Spine Wo Contrast  Result Date: 06/06/2021 CLINICAL DATA:  Fall with head injury.  Initial encounter. EXAM: CT HEAD WITHOUT CONTRAST CT CERVICAL SPINE WITHOUT CONTRAST TECHNIQUE: Multidetector CT imaging of the head and cervical spine was performed following the standard protocol without intravenous contrast. Multiplanar CT image reconstructions of the cervical spine were also generated. COMPARISON:  12/12/2020 head CT FINDINGS: CT HEAD FINDINGS Brain: No evidence of acute infarction, hemorrhage, hydrocephalus, extra-axial collection or mass lesion/mass effect. Vascular: No hyperdense vessel or unexpected calcification. Skull: Left-sided scalp swelling.  No acute fracture Sinuses/Orbits: No evidence of injury CT CERVICAL SPINE FINDINGS Alignment: No traumatic malalignment. Degenerative reversal of cervical lordosis.Mild C4-5 anterolisthesis, degenerative appearing. Skull base and vertebrae: No acute fracture. Soft tissues and spinal  canal: No prevertebral fluid or swelling. No visible canal hematoma. 17 mm right thyroid nodule which has enlarged since 2021. Accentuated supraclavicular fat and nodes, benign appearing given stability. Disc levels: Asymmetric left facet osteoarthritis with moderate to bulky spurring. Upper chest: Negative IMPRESSION: 1. No evidence of acute intracranial or cervical spine injury. 2. Left scalp contusion without calvarial fracture. 3. 17 mm right thyroid nodule which has enlarged since 2021. Recommend thyroid US (ref: J Am Coll Radiol. 2015 Feb;12(2): 143-50). Electronically Signed   By: Jorje Guild M.D.   On: 06/06/2021 06:59   DG Chest Portable 1 View  Result Date: 06/23/2021 CLINICAL DATA:  Shortness of breath. EXAM: PORTABLE CHEST 1 VIEW COMPARISON:  Chest x-ray 06/06/2021. FINDINGS: There are minimal left basilar opacities. The lungs are otherwise clear. No pleural effusion or pneumothorax. Cardiomediastinal silhouette within normal limits. No acute fractures. IMPRESSION: 1. Minimal left basilar atelectasis/airspace disease. Electronically Signed   By: Ronney Asters M.D.   On: 06/23/2021 16:25   DG Knee Complete 4 Views Left  Result Date: 06/06/2021 CLINICAL DATA:  Trauma, fall EXAM: LEFT KNEE - COMPLETE 4+ VIEW COMPARISON:  Study done earlier today FINDINGS: No evidence of fracture, dislocation, or joint effusion. Possible tiny bony spurs seen in patella. Soft tissues are unremarkable. IMPRESSION: No fracture or dislocation is seen. Electronically Signed   By: Elmer Picker M.D.   On: 06/06/2021 08:09   DG Knee Complete 4 Views Left  Result Date: 06/06/2021 CLINICAL DATA:  Fall with left knee pain.  Initial encounter. EXAM: LEFT KNEE - COMPLETE 4 VIEW COMPARISON:  None. FINDINGS: No evidence of fracture, dislocation, or joint effusion. No evidence of arthropathy or other focal bone abnormality. Soft tissues are unremarkable. IMPRESSION: Negative. Electronically Signed   By: Jorje Guild M.D.   On: 06/06/2021 07:01   DG Humerus Left  Result Date: 06/06/2021 CLINICAL DATA:  Fall last night with left arm pain. Initial encounter. EXAM: LEFT HUMERUS - 2+ VIEW COMPARISON:  10/31/2019 FINDINGS: [place] at [facility] joint. No acute fracture or subluxation. Negative soft tissues. IMPRESSION: No acute finding. Electronically Signed   By: Jorje Guild M.D.   On: 06/06/2021 07:00  [4 week]   Impression: Pleasant 65 year old female with past medical history significant for COPD on 3 L of oxygen at home, dermatomyositis, GERD, questionable Crohn's, hypertension, CHF, A. fib, IDA with recent hospitalization requiring transfusion presenting with recurrent symptomatic anemia.  GI consulted for IDA.  IDA: Baseline has been in the upper 7 range.  Required 2 units of blood in September.  Presents  with hemoglobin of 5.8.  Reports coffee appearing stool, multiple episodes which occurred about 1 week ago.  EGD and colonoscopy in September unrevealing.  She takes aspirin 81 mg daily but denies other NSAIDs.  She is on a PPI.  She would benefit from capsule endoscopy which she is never had before.  Given her current abdominal x-ray and CT findings from 1 year ago, would advise pursuing Agile (dummy capsule) prior to swallowing real video capsule. If Agile passes, hopefully will have her swallow real video capsule tomorrow.   Plan: Patient requesting help with getting new PCP. Agile capsule today. Follow up imaging test 26-28 hours later to verify capsule passed before swallowing real video capsule.  Continue to monitor H&H and for overt bleeding. Clear liquids today. NPO after midnight for possible real video capsule tomorrow.   We would like to thank you for the opportunity to participate in the care of TIAJAH OYSTER.  Laureen Ochs. Bernarda Caffey [facility] [phone] 11/17/202212:00 PM    LOS: 0 days

## 2021-06-24 NOTE — TOC Initial Note (Signed)
Transition of Care [facility]) - Initial/Assessment Note    Patient Details  Name: Kayla Ryan MRN: [id number] Date of Birth: 05/23/1956  [facility]) CM/SW Contact:    Natasha Bence, LCSW Phone Number: 06/24/2021, 11:51 AM  Clinical Narrative:                 Patient is a 65 year old female admitted for Acute on chronic anemia. CSW conducted initial assessment with patient's sister. Patient's sister reported that patient has been experiencing memory impairment and weakness more frequently. Patient lives at home alone, but has some support from family. Patient is able to complete ADL's independently at baseline when not experiencing weakness. Patient's sister agreeable to [facility]. CSW referred patient to [place] with [id number]. Lattie Haw with [facility] agreeable to provide [facility] services. TOC to follow.  Expected Discharge Plan: [place] Barriers to Discharge: Continued Medical Work up   Patient Goals and CMS Choice Patient states their goals for this hospitalization and ongoing recovery are:: Return home with [facility] CMS Medicare.gov Compare Post Acute Care list provided to:: Patient Choice offered to / list presented to : Patient  Expected Discharge Plan and Services Expected Discharge Plan: [place]       Living arrangements for the past 2 months: Single Family Home                           HH Arranged: PT, Social Work [employer] Agency: [place] Date [place]: 06/24/21 Time [place]: [phone] Representative spoke with at [place]: Nora Arrangements/Services Living arrangements for the past 2 months: [place] with:: Self Patient language and need for interpreter reviewed:: Yes Do you feel safe going back to the place where you live?: Yes      Need for Family Participation in Patient Care: Yes (Comment) Care giver support system in place?: Yes (comment)   Criminal Activity/Legal Involvement  Pertinent to Current Situation/Hospitalization: No - Comment as needed  Activities of Daily Living Home Assistive Devices/Equipment: Eyeglasses, Cane (specify quad or straight) ADL Screening (condition at time of admission) Patient's cognitive ability adequate to safely complete daily activities?: Yes Is the patient deaf or have difficulty hearing?: No Does the patient have difficulty seeing, even when wearing glasses/contacts?: Yes Does the patient have difficulty concentrating, remembering, or making decisions?: Yes Patient able to express need for assistance with ADLs?: Yes Does the patient have difficulty dressing or bathing?: Yes Independently performs ADLs?: No Communication: Independent Grooming: Independent Feeding: Independent Toileting: Needs assistance Is this a change from baseline?: Change from baseline, expected to last >3days In/Out Bed: Independent Walks in Home: Independent with device (comment) Does the patient have difficulty walking or climbing stairs?: Yes Weakness of Legs: Both Weakness of Arms/Hands: Both  Permission Sought/Granted Permission sought to share information with : Family Supports Permission granted to share information with : Yes, Verbal Permission Granted  Share Information with NAME: Wyvonne Lenz  Permission granted to share info w AGENCY: Local HH  Permission granted to share info w Relationship: (Sister)  Permission granted to share info w Contact Information: [phone]  Emotional Assessment     Affect (typically observed): Accepting, Adaptable Orientation: : Oriented to Self, Oriented to Place Alcohol / Substance Use: Never Used Psych Involvement: No (comment)  Admission diagnosis:  Swelling [R60.9] Symptomatic anemia [D64.9] Acute on chronic anemia [D64.9] Patient Active Problem List  Diagnosis Date Noted   Symptomatic anemia    Abdominal bloating    Community acquired pneumonia of left lower lobe of lung    Gastroenteritis  04/30/2021   Chronic respiratory failure with hypoxia ([place]) 01/28/2021   AKI (acute kidney injury) ([place]) 08/18/2020   Hypothyroidism 07/26/2020   Acute respiratory failure with hypoxia ([place]) 07/26/2020   B12 and Folate deficiency 07/26/2020   Folate deficiency anemia--B12 AND Folate deficiency 07/26/2020   Atrial fibrillation with RVR ([place]) 07/25/2020   Acute on chronic anemia 07/24/2020   Leukocytosis 07/24/2020   Pulmonary nodules 07/24/2020   Acute CHF (congestive heart failure) ([place]) 07/24/2020   Dyspnea 01/31/2020   Anemia 01/03/2020   Syncope 06/08/2019   Hypokalemia 06/08/2019   Trigger finger, acquired 11/03/2017   Chronic obstructive pulmonary disease/Emphysema 08/23/2017   Chronic pain 08/23/2017   History of Crohn's disease 08/23/2017   Myopathy 08/23/2017   Right shoulder injury, subsequent encounter 08/23/2017   Primary insomnia 02/01/2017   Esophageal dysphagia 10/06/2016   Essential hypertension 07/17/2016   Iron deficiency anemia 03/25/2016   History of Bell's palsy 03/03/2015   Nuclear sclerosis of both eyes 03/03/2015   Allergic rhinitis 05/18/2014   Anxiety 05/18/2014   Arthritis 05/18/2014   Asthma 05/18/2014   Chronic constipation 05/18/2014   Dermatomyositis ([place]) 05/18/2014   Migraine without status migrainosus, not intractable 05/18/2014   Rectocele 05/18/2014   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   Peripheral neuropathy 05/18/2014   Chest pain 10/30/2013   Hyperlipidemia 10/30/2013   Carotid artery disease ([place]) 10/30/2013   PCP:  Guadlupe Spanish, MD Pharmacy:   [facility], [address] AT [address]. [place] [address] Phone: [phone] Fax: [phone]     Social Determinants of Health (SDOH) Interventions    Readmission Risk Interventions Readmission Risk Prevention Plan 08/18/2020  Transportation Screening Complete  Medication  Review Press photographer) Complete  HRI or Home Care Consult Complete  SW Recovery Care/Counseling Consult Complete  Palliative Care Screening Not Applicable  Some recent data might be hidden

## 2021-06-24 NOTE — Plan of Care (Signed)
  Problem: Acute Rehab PT Goals(only PT should resolve) Goal: Pt Will Go Supine/Side To Sit Outcome: Progressing Flowsheets (Taken 06/24/2021 1441) Pt will go Supine/Side to Sit: Independently Goal: Patient Will Transfer Sit To/From Stand Outcome: Progressing Flowsheets (Taken 06/24/2021 1441) Patient will transfer sit to/from stand: with modified independence Goal: Pt Will Transfer Bed To Chair/Chair To Bed Outcome: Progressing Flowsheets (Taken 06/24/2021 1441) Pt will Transfer Bed to Chair/Chair to Bed: with modified independence Goal: Pt Will Ambulate Outcome: Progressing Flowsheets (Taken 06/24/2021 1441) Pt will Ambulate:  75 feet  with modified independence  with supervision  with cane   2:41 PM, 06/24/21 Lonell Grandchild, MPT Physical Therapist with [facility] [phone] office [phone] mobile phone

## 2021-06-25 ENCOUNTER — Observation Stay (HOSPITAL_COMMUNITY): Payer: Medicare Other

## 2021-06-25 ENCOUNTER — Inpatient Hospital Stay (HOSPITAL_COMMUNITY): Payer: Medicare Other

## 2021-06-25 ENCOUNTER — Encounter (HOSPITAL_COMMUNITY): Payer: Self-pay | Admitting: Internal Medicine

## 2021-06-25 DIAGNOSIS — J45909 Unspecified asthma, uncomplicated: Secondary | ICD-10-CM | POA: Diagnosis not present

## 2021-06-25 DIAGNOSIS — M797 Fibromyalgia: Secondary | ICD-10-CM | POA: Diagnosis present

## 2021-06-25 DIAGNOSIS — J449 Chronic obstructive pulmonary disease, unspecified: Secondary | ICD-10-CM | POA: Diagnosis not present

## 2021-06-25 DIAGNOSIS — R110 Nausea: Secondary | ICD-10-CM | POA: Diagnosis not present

## 2021-06-25 DIAGNOSIS — K6389 Other specified diseases of intestine: Secondary | ICD-10-CM | POA: Diagnosis not present

## 2021-06-25 DIAGNOSIS — T189XXA Foreign body of alimentary tract, part unspecified, initial encounter: Secondary | ICD-10-CM | POA: Diagnosis not present

## 2021-06-25 DIAGNOSIS — Z888 Allergy status to other drugs, medicaments and biological substances status: Secondary | ICD-10-CM | POA: Diagnosis not present

## 2021-06-25 DIAGNOSIS — J439 Emphysema, unspecified: Secondary | ICD-10-CM | POA: Diagnosis present

## 2021-06-25 DIAGNOSIS — R140 Abdominal distension (gaseous): Secondary | ICD-10-CM | POA: Diagnosis not present

## 2021-06-25 DIAGNOSIS — I480 Paroxysmal atrial fibrillation: Secondary | ICD-10-CM | POA: Diagnosis present

## 2021-06-25 DIAGNOSIS — E785 Hyperlipidemia, unspecified: Secondary | ICD-10-CM | POA: Diagnosis present

## 2021-06-25 DIAGNOSIS — Z825 Family history of asthma and other chronic lower respiratory diseases: Secondary | ICD-10-CM | POA: Diagnosis not present

## 2021-06-25 DIAGNOSIS — K50911 Crohn's disease, unspecified, with rectal bleeding: Secondary | ICD-10-CM | POA: Diagnosis present

## 2021-06-25 DIAGNOSIS — D649 Anemia, unspecified: Secondary | ICD-10-CM | POA: Diagnosis not present

## 2021-06-25 DIAGNOSIS — F32A Depression, unspecified: Secondary | ICD-10-CM | POA: Diagnosis present

## 2021-06-25 DIAGNOSIS — M3313 Other dermatomyositis without myopathy: Secondary | ICD-10-CM | POA: Diagnosis present

## 2021-06-25 DIAGNOSIS — Z7982 Long term (current) use of aspirin: Secondary | ICD-10-CM | POA: Diagnosis not present

## 2021-06-25 DIAGNOSIS — G8929 Other chronic pain: Secondary | ICD-10-CM | POA: Diagnosis present

## 2021-06-25 DIAGNOSIS — Z882 Allergy status to sulfonamides status: Secondary | ICD-10-CM | POA: Diagnosis not present

## 2021-06-25 DIAGNOSIS — Z79899 Other long term (current) drug therapy: Secondary | ICD-10-CM | POA: Diagnosis not present

## 2021-06-25 DIAGNOSIS — F419 Anxiety disorder, unspecified: Secondary | ICD-10-CM | POA: Diagnosis present

## 2021-06-25 DIAGNOSIS — J9611 Chronic respiratory failure with hypoxia: Secondary | ICD-10-CM | POA: Diagnosis not present

## 2021-06-25 DIAGNOSIS — R109 Unspecified abdominal pain: Secondary | ICD-10-CM | POA: Diagnosis not present

## 2021-06-25 DIAGNOSIS — G7100 Muscular dystrophy, unspecified: Secondary | ICD-10-CM | POA: Diagnosis present

## 2021-06-25 DIAGNOSIS — K921 Melena: Secondary | ICD-10-CM | POA: Diagnosis not present

## 2021-06-25 DIAGNOSIS — I10 Essential (primary) hypertension: Secondary | ICD-10-CM | POA: Diagnosis not present

## 2021-06-25 DIAGNOSIS — Z9981 Dependence on supplemental oxygen: Secondary | ICD-10-CM | POA: Diagnosis not present

## 2021-06-25 DIAGNOSIS — Z20822 Contact with and (suspected) exposure to covid-19: Secondary | ICD-10-CM | POA: Diagnosis present

## 2021-06-25 DIAGNOSIS — M419 Scoliosis, unspecified: Secondary | ICD-10-CM | POA: Diagnosis present

## 2021-06-25 DIAGNOSIS — Z87891 Personal history of nicotine dependence: Secondary | ICD-10-CM | POA: Diagnosis not present

## 2021-06-25 DIAGNOSIS — R609 Edema, unspecified: Secondary | ICD-10-CM | POA: Diagnosis present

## 2021-06-25 DIAGNOSIS — Z885 Allergy status to narcotic agent status: Secondary | ICD-10-CM | POA: Diagnosis not present

## 2021-06-25 DIAGNOSIS — D62 Acute posthemorrhagic anemia: Secondary | ICD-10-CM | POA: Diagnosis present

## 2021-06-25 LAB — BASIC METABOLIC PANEL
Anion gap: 7 (ref 5–15)
BUN: 14 mg/dL (ref 8–23)
CO2: 31 mmol/L (ref 22–32)
Calcium: 8.8 mg/dL — ABNORMAL LOW (ref 8.9–10.3)
Chloride: 99 mmol/L (ref 98–111)
Creatinine, Ser: 1.04 mg/dL — ABNORMAL HIGH (ref 0.44–1.00)
GFR, Estimated: 59 mL/min — ABNORMAL LOW (ref >60)
Glucose, Bld: 148 mg/dL — ABNORMAL HIGH (ref 70–99)
Potassium: 5.1 mmol/L (ref 3.5–5.1)
Sodium: 137 mmol/L (ref 135–145)

## 2021-06-25 LAB — CBC
HCT: 24.5 % — ABNORMAL LOW (ref 36.0–46.0)
Hemoglobin: 7.3 g/dL — ABNORMAL LOW (ref 12.0–15.0)
MCH: 23.9 pg — ABNORMAL LOW (ref 26.0–34.0)
MCHC: 29.8 g/dL — ABNORMAL LOW (ref 30.0–36.0)
MCV: 80.1 fL (ref 80.0–100.0)
Platelets: 372 10*3/uL (ref 150–400)
RBC: 3.06 MIL/uL — ABNORMAL LOW (ref 3.87–5.11)
RDW: 17.8 % — ABNORMAL HIGH (ref 11.5–15.5)
WBC: 7.1 10*3/uL (ref 4.0–10.5)
nRBC: 0 % (ref 0.0–0.2)

## 2021-06-25 LAB — MAGNESIUM: Magnesium: 2.6 mg/dL — ABNORMAL HIGH (ref 1.7–2.4)

## 2021-06-25 MED ORDER — LACTATED RINGERS IV SOLN: INTRAVENOUS | Status: AC

## 2021-06-25 MED ORDER — FENTANYL CITRATE PF 50 MCG/ML IJ SOSY: 25.0000 ug | PREFILLED_SYRINGE | INTRAMUSCULAR | Status: DC | PRN

## 2021-06-25 NOTE — Progress Notes (Addendum)
PROGRESS NOTE    Kayla Ryan  PZW:[id number] DOB: [birth year] DOA: 06/23/2021 PCP: Guadlupe Spanish, MD   Brief Narrative:   Patient 65 year old female history of atrial fibrillation, asthma, COPD with chronic respiratory failure, hypertension, Crohn's disease, chronic anemia, history of multiple transfusions was seen at PCPs office noted to have a low hemoglobin of 6.2 patient sent by EMS to the ED.  Patient noted to have weeks of generalized weakness, dyspnea on exertion.  Patient on presentation to the ED noted to have hemoglobin of 5.8.  Patient transfused 2 units packed red blood cells with improvements in hemoglobin levels noted, but this continues to be low.  She is being seen by GI with agile capsule evaluation pending and plans for potential capsule endoscopy after abdominal film later today.  She currently remains NPO.  Assessment & Plan:   Principal Problem:   Acute on chronic anemia Active Problems:   Iron deficiency anemia   Asthma   Chronic obstructive pulmonary disease/Emphysema   Dermatomyositis (HCC)   Essential hypertension   Chronic respiratory failure with hypoxia (HCC)   Symptomatic anemia   Abdominal bloating   Melena   #1 acute on chronic anemia/iron deficiency anemia/symptomatic anemia -Patient presented with hemoglobin of 5.6 from last discharge of 05/03/2021 hemoglobin noted at 7.8. -Patient with prior history of multiple blood transfusions. -Patient did report to admitting physician had some coffee colored stools approximately a week ago. -Patient seen by GI during last hospitalization noted to have EGD and colonoscopy 04/30/2021 with no etiology of bleed outpatient capsule endoscopy was recommended however not done. -Patient currently n.p.o. pending GI evaluation. -Status post transfusion 2 units packed red blood cells with posttransfusion H&H pending. -Transfusion threshold hemoglobin < 7.  Repeat CBC in AM. -Noted to have prior iron deficiency, but  allergic to iron -Continue PPI IV every 12 hours. -GI consultation appreciated with dummy capsule for now and plans for capsule endoscopy later today if she is passing the current capsule on abdominal film. -Hold aspirin  2.  Abdominal bloating -Patient noted to have abdominal bloating on presentation however denies any further abdominal pain today. -KUB ordered with mildly dilated mid to lower abdominal small bowel segments could be due to ileus or low-grade partial small bowel obstruction.  Abdominal exam unremarkable. -We will give a Dulcolax suppository. -Repeat abdominal films this afternoon -GI ongoing consultation as above  3.  Mechanical fall -Patient noted to have a mechanical fall prior to admission with persistent right elbow pain and swelling, noted patient tripped over her oxygen cord. -CT head plain films of the right elbow with no acute abnormalities. -Pain management. -Supportive care. -PT recommending home health with wound rolling walker on discharge  4.  Dyspnea -Patient with complaints of dyspnea on exertion with a stable persistent cough since treatment for pneumonia in September with cephalosporin allergy. -Patient reported fevers at home currently afebrile with normal white count. -Chest x-ray done with minimal left base atelectasis versus airspace disease. -Reassess after patient has been transfused accordingly and hemoglobin above 7-8. -Hold off on antibiotics at this time. -Follow.  5.  COPD with chronic respiratory failure on 4 L nasal cannula -Stable. -Continue Singulair, PPI, Atrovent nebs.  6.  Dermatomyositis/chronic pain -Being followed at [place] home regimen xtampza ER and as needed oxycodone.  7.  Paroxysmal atrial fibrillation -Currently normal sinus rhythm. -Continue Cardizem for rate control. -Not on anticoagulation due to recurrent anemia.  8.  Depression/anxiety -Continue home regimen duloxetine, doxepin,  Ativan  as needed.  9.  Hypertension -Continue home regimen Cardizem.     DVT prophylaxis: SCDs. Code Status: Full Family Communication: Discussed with sister on phone 11/18 Disposition:    Status is: Observation  The patient will require care spanning > 2 midnights and should be moved to inpatient because: Ongoing diagnostic testing required.      Consultants:  Gastroenterology    Procedures:  Transfusing 2 units packed red blood cells 06/23/2021-06/24/2021 Abdominal films 06/23/2021 Plain films of the right elbow 06/23/2021 Chest x-ray 06/23/2021     Antimicrobials: None    Subjective: Patient seen and evaluated today with ongoing shortness of breath with ambulation noted.  She is wanting to have something to drink if possible, but remains n.p.o. for her current study.  Objective: Vitals:   06/24/21 2024 06/25/21 0504 06/25/21 0735 06/25/21 0928  BP:  120/64    Pulse: 75 73    Resp: 18 19    Temp:  98.7 F (37.1 C)    TempSrc:      SpO2: 98% 99% 98% 98%  Weight:      Height:        Intake/Output Summary (Last 24 hours) at 06/25/2021 1218 Last data filed at 06/25/2021 0900 Gross per 24 hour  Intake 1543.5 ml  Output 1350 ml  Net 193.5 ml   Filed Weights   06/23/21 2056  Weight: 84.8 kg    Examination:  General exam: Appears calm and comfortable  Respiratory system: Clear to auscultation. Respiratory effort normal. Currently on 3L [place] Cardiovascular system: S1 & S2 heard, RRR.  Gastrointestinal system: Abdomen is soft Central nervous system: Alert and awake Extremities: No edema Skin: No significant lesions noted Psychiatry: Flat affect.    Data Reviewed: I have personally reviewed following labs and imaging studies  CBC: Recent Labs  Lab 06/23/21 1559 06/24/21 0414 06/24/21 0945 06/25/21 0600  WBC 9.6 8.1  --  7.1  HGB 5.8* 6.3* 7.5* 7.3*  HCT 20.9* 21.8* 24.3* 24.5*  MCV 79.2* 80.1  --  80.1  PLT 300 426*  --  219   Basic Metabolic  Panel: Recent Labs  Lab 06/23/21 1559 06/24/21 0414 06/25/21 0600  NA 135 136 137  K 3.5 4.2 5.1  CL 97* 98 99  CO2 26 30 31   GLUCOSE 165* 148* 148*  BUN 8 11 14   CREATININE 1.11* 1.21* 1.04*  CALCIUM 8.8* 8.3* 8.8*  MG  --   --  2.6*   GFR: Estimated Creatinine Clearance: 61 mL/min (A) (by C-G formula based on SCr of 1.04 mg/dL (H)). Liver Function Tests: Recent Labs  Lab 06/23/21 1559  AST 25  ALT 14  ALKPHOS 96  BILITOT 0.5  PROT 7.7  ALBUMIN 4.1   No results for input(s): LIPASE, AMYLASE in the last 168 hours. No results for input(s): AMMONIA in the last 168 hours. Coagulation Profile: No results for input(s): INR, PROTIME in the last 168 hours. Cardiac Enzymes: No results for input(s): CKTOTAL, CKMB, CKMBINDEX, TROPONINI in the last 168 hours. BNP (last 3 results) Recent Labs    01/28/21 1329  PROBNP 73.0   HbA1C: No results for input(s): HGBA1C in the last 72 hours. CBG: No results for input(s): GLUCAP in the last 168 hours. Lipid Profile: No results for input(s): CHOL, HDL, LDLCALC, TRIG, CHOLHDL, LDLDIRECT in the last 72 hours. Thyroid Function Tests: No results for input(s): TSH, T4TOTAL, FREET4, T3FREE, THYROIDAB in the last 72 hours. Anemia Panel: No results for input(s):  VITAMINB12, FOLATE, FERRITIN, TIBC, IRON, RETICCTPCT in the last 72 hours. Sepsis Labs: No results for input(s): PROCALCITON, LATICACIDVEN in the last 168 hours.  Recent Results (from the past 240 hour(s))  Resp Panel by RT-PCR (Flu A&B, Covid) Nasopharyngeal Swab     Status: None   Collection Time: 06/23/21  6:20 PM   Specimen: Nasopharyngeal Swab; Nasopharyngeal(NP) swabs in vial transport medium  Result Value Ref Range Status   SARS Coronavirus 2 by RT PCR NEGATIVE NEGATIVE Final    Comment: (NOTE) SARS-CoV-2 target nucleic acids are NOT DETECTED.  The SARS-CoV-2 RNA is generally detectable in upper respiratory specimens during the acute phase of infection. The  lowest concentration of SARS-CoV-2 viral copies this assay can detect is 138 copies/mL. A negative result does not preclude SARS-Cov-2 infection and should not be used as the sole basis for treatment or other patient management decisions. A negative result may occur with  improper specimen collection/handling, submission of specimen other than nasopharyngeal swab, presence of viral mutation(s) within the areas targeted by this assay, and inadequate number of viral copies(<138 copies/mL). A negative result must be combined with clinical observations, patient history, and epidemiological information. The expected result is Negative.  Fact Sheet for Patients:  EntrepreneurPulse.com.au  Fact Sheet for Healthcare Providers:  IncredibleEmployment.be  This test is no t yet approved or cleared by the [place] FDA and  has been authorized for detection and/or diagnosis of SARS-CoV-2 by FDA under an Emergency Use Authorization (EUA). This EUA will remain  in effect (meaning this test can be used) for the duration of the COVID-19 declaration under Section 564(b)(1) of the Act, 21 U.S.C.section 360bbb-3(b)(1), unless the authorization is terminated  or revoked sooner.       Influenza A by PCR NEGATIVE NEGATIVE Final   Influenza B by PCR NEGATIVE NEGATIVE Final    Comment: (NOTE) The Xpert Xpress SARS-CoV-2/FLU/RSV plus assay is intended as an aid in the diagnosis of influenza from Nasopharyngeal swab specimens and should not be used as a sole basis for treatment. Nasal washings and aspirates are unacceptable for Xpert Xpress SARS-CoV-2/FLU/RSV testing.  Fact Sheet for Patients: EntrepreneurPulse.com.au  Fact Sheet for Healthcare Providers: IncredibleEmployment.be  This test is not yet approved or cleared by the [place] FDA and has been authorized for detection and/or diagnosis of SARS-CoV-2 by FDA under  an Emergency Use Authorization (EUA). This EUA will remain in effect (meaning this test can be used) for the duration of the COVID-19 declaration under Section 564(b)(1) of the Act, 21 U.S.C. section 360bbb-3(b)(1), unless the authorization is terminated or revoked.  Performed at [facility], [address] [place], [place] [address]   MRSA Next Gen by PCR, Nasal     Status: None   Collection Time: 06/23/21  8:40 PM   Specimen: Nasal Mucosa; Nasal Swab  Result Value Ref Range Status   MRSA by PCR Next Gen NOT DETECTED NOT DETECTED Final    Comment: (NOTE) The GeneXpert MRSA Assay (FDA approved for NASAL specimens only), is one component of a comprehensive MRSA colonization surveillance program. It is not intended to diagnose MRSA infection nor to guide or monitor treatment for MRSA infections. Test performance is not FDA approved in patients less than 28 years old. Performed at [facility], [address] [place],  [address]          Radiology Studies: DG Elbow 2 Views Right  Result Date: 06/23/2021 CLINICAL DATA:  Pain, swelling posterior elbow EXAM: RIGHT ELBOW -  2 VIEW COMPARISON:  06/06/2021 FINDINGS: There is no evidence of fracture, dislocation, or joint effusion. There is no evidence of arthropathy or other focal bone abnormality. Soft tissues are unremarkable. IMPRESSION: Negative. Electronically Signed   By: Rolm Baptise M.D.   On: 06/23/2021 19:53   DG Abd 1 View  Result Date: 06/24/2021 CLINICAL DATA:  Crohn's disease with abdominal pain and bloating. EXAM: ABDOMEN - 1 VIEW COMPARISON:  Abdomen series 06/07/2019 FINDINGS: There are mildly dilated small bowel loops in left mid and lower abdomen up to 3.5 cm caliber. Bowel gas and stool noted in the large intestine at least to the transverse segment. There are cholecystectomy clips. No pathologic calcifications are identified. Visceral shadows are stable. Lung bases are clear. IMPRESSION: Mildly dilated mid to  lower abdominal small bowel segments, which could be due to an ileus or low-grade partial small bowel obstruction. Follow-up recommended. Electronically Signed   By: Telford Nab M.D.   On: 06/24/2021 00:01   DG Chest Portable 1 View  Result Date: 06/23/2021 CLINICAL DATA:  Shortness of breath. EXAM: PORTABLE CHEST 1 VIEW COMPARISON:  Chest x-ray 06/06/2021. FINDINGS: There are minimal left basilar opacities. The lungs are otherwise clear. No pleural effusion or pneumothorax. Cardiomediastinal silhouette within normal limits. No acute fractures. IMPRESSION: 1. Minimal left basilar atelectasis/airspace disease. Electronically Signed   By: Ronney Asters M.D.   On: 06/23/2021 16:25        Scheduled Meds:  acyclovir  400 mg Oral BID   bisacodyl  10 mg Rectal Once   diltiazem  120 mg Oral Daily   doxepin  10 mg Oral QHS   DULoxetine  60 mg Oral BID   gabapentin  800 mg Oral TID   gemfibrozil  600 mg Oral BID AC   ipratropium  2.5 mL Inhalation Q6H   loratadine  10 mg Oral Daily   magnesium hydroxide  15 mL Oral QHS   montelukast  10 mg Oral Daily   oxyCODONE  15 mg Oral Q12H   pantoprazole (PROTONIX) IV  40 mg Intravenous Q12H   senna-docusate  1 tablet Oral BID     LOS: 0 days    Time spent: 35 minutes    Silverio Hagan Darleen Crocker, DO [facility]  If 7PM-7AM, please contact night-coverage [email or web address] 06/25/2021, 12:18 PM

## 2021-06-25 NOTE — Plan of Care (Signed)

## 2021-06-25 NOTE — Progress Notes (Signed)
Physical Therapy Treatment Patient Details Name: Kayla Ryan MRN: [id number] DOB: 10/13/1955 Today's Date: 06/25/2021   History of Present Illness Kayla Ryan is a 65 y.o. female with medical history significant for atrial fibrillation, asthma, COPD with chronic respiratory failure, hypertension, Crohn's disease, chronic anemia,  Multiple transfusions.  Patient was sent to the ED by outpatient provider reports of low hemoglobin of 6.2.  Patient came in by EMS.  Patient reports over the past few days to weeks, she has been weak.  She has had difficulty breathing with exertion but at rest she is okay.  She denies abdominal pain. Patient reports having 8 episodes of coffee colored stool in a day about a week ago.  She denies vomiting of blood or blood in stools.   She also reports fever at home up to 99, and another time up to 100.  She reports since her hospitalization in September she has had a persistent cough which is unchanged.  Cough is now dry.   She reports base line 3 -4 stools daily, and reports right lower quadrant abdominal pain with bloating today.    PT Comments    Pt indepdendent bed mobility and transfers, has been ambulating around room with no reports of LOB or recent fall.  Increased cadence and distance with gait training while pushing O2 tank with no LOB with main c/o SOB.  Pt on 3L O2 continuous at home and O2 saturation % range from 93-98%.  Pt functioning at baseline at this time.  All goals met, DC to care of nursing for ambulation as tolerated for length of stay.     Recommendations for follow up therapy are one component of a multi-disciplinary discharge planning process, led by the attending physician.  Recommendations may be updated based on patient status, additional functional criteria and insurance authorization.  Follow Up Recommendations  Home health PT     Assistance Recommended at Discharge PRN  Equipment Recommendations  Rolling walker (2 wheels)     Recommendations for Other Services       Precautions / Restrictions Precautions Precautions: Fall     Mobility  Bed Mobility Overal bed mobility: Independent                  Transfers Overall transfer level: Independent Equipment used:  (Pushed O2 tank)   Sit to Stand: Independent Stand pivot transfers: Independent              Ambulation/Gait Ambulation/Gait assistance: Modified independent (Device/Increase time);Supervision Gait Distance (Feet): 100 Feet Assistive device:  (Pushing O2 tank) Gait Pattern/deviations: WFL(Within Functional Limits) Gait velocity: WNL     General Gait Details: incrased cadence to WNL, c/o SOB during increased distance gait with O2 saturation 93-98%   Stairs             Wheelchair Mobility    Modified Rankin (Stroke Patients Only)       Balance                                            Cognition Arousal/Alertness: Awake/alert Behavior During Therapy: WFL for tasks assessed/performed Overall Cognitive Status: Within Functional Limits for tasks assessed  Exercises      General Comments        Pertinent Vitals/Pain Pain Assessment: No/denies pain Pain Score: 7  Pain Location: LBP Pain Descriptors / Indicators: Discomfort;Sore Pain Intervention(s): Premedicated before session;Monitored during session;Repositioned    Home Living                          Prior Function            PT Goals (current goals can now be found in the care plan section) Acute Rehab PT Goals Patient Stated Goal: return home with family/friends to assist PT Goal Formulation: With patient Potential to Achieve Goals: Good Progress towards PT goals: Goals met/education completed, patient discharged from PT    Frequency    Min 2X/week      PT Plan Current plan remains appropriate    Co-evaluation              AM-PAC PT  "6 Clicks" Mobility   Outcome Measure  Help needed turning from your back to your side while in a flat bed without using bedrails?: None Help needed moving from lying on your back to sitting on the side of a flat bed without using bedrails?: None   Help needed standing up from a chair using your arms (e.g., wheelchair or bedside chair)?: None Help needed to walk in hospital room?: None Help needed climbing 3-5 steps with a railing? : A Little 6 Click Score: 19    End of Session Equipment Utilized During Treatment: Oxygen Activity Tolerance: Patient tolerated treatment well;Patient limited by fatigue Patient left: in chair;with call bell/phone within reach Nurse Communication: Mobility status PT Visit Diagnosis: Unsteadiness on feet (R26.81);Other abnormalities of gait and mobility (R26.89);Muscle weakness (generalized) (M62.81)     Time: 5520-8022 PT Time Calculation (min) (ACUTE ONLY): 15 min  Charges:  $Therapeutic Activity: 8-22 mins                    Ihor Austin, LPTA/CLT; CBIS [phone]    Aldona Lento 06/25/2021, 9:36 AM

## 2021-06-25 NOTE — Evaluation (Signed)
Occupational Therapy Evaluation Patient Details Name: Kayla Ryan MRN: [id number] DOB: 1956/06/14 Today's Date: 06/25/2021   History of Present Illness Kayla Ryan is a 65 y.o. female with medical history significant for atrial fibrillation, asthma, COPD with chronic respiratory failure, hypertension, Crohn's disease, chronic anemia,  Multiple transfusions.  Patient was sent to the ED by outpatient provider reports of low hemoglobin of 6.2.  Patient came in by EMS.  Patient reports over the past few days to weeks, she has been weak.  She has had difficulty breathing with exertion but at rest she is okay.  She denies abdominal pain. Patient reports having 8 episodes of coffee colored stool in a day about a week ago.  She denies vomiting of blood or blood in stools.   She also reports fever at home up to 99, and another time up to 100.  She reports since her hospitalization in September she has had a persistent cough which is unchanged.  Cough is now dry.   She reports base line 3 -4 stools daily, and reports right lower quadrant abdominal pain with bloating today.   Clinical Impression   Pt agreeable to OT evaluation. Pt operating at level of modified independent today for lower body dressing and functional mobility for transfers. Pt does have baseline deficits of B UE due to muscular dystrophy. Pt also reports a history of falling at home leading to the recommendation of home health to ensure pt safety and optimal function with ADL's and IADL's. Pt is not recommended for further acute OT services and will be discharged to care of nursing staff for remaining length of stay.      Recommendations for follow up therapy are one component of a multi-disciplinary discharge planning process, led by the attending physician.  Recommendations may be updated based on patient status, additional functional criteria and insurance authorization.   Follow Up Recommendations  Home health OT    Assistance  Recommended at Discharge PRN  Functional Status Assessment  Patient has had a recent decline in their functional status and demonstrates the ability to make significant improvements in function in a reasonable and predictable amount of time.  Equipment Recommendations  None recommended by OT    Recommendations for Other Services       Precautions / Restrictions Precautions Precautions: Fall Restrictions Weight Bearing Restrictions: No      Mobility Bed Mobility Overal bed mobility: Independent                  Transfers Overall transfer level: Modified independent Equipment used: Rolling walker (2 wheels) Transfers: Sit to/from Stand;Bed to chair/wheelchair/BSC Sit to Stand: Modified independent (Device/Increase time) Stand pivot transfers: Independent   Step pivot transfers: Modified independent (Device/Increase time)     General transfer comment: Put using RW this date due to reports of leaning on furntire at home.      Balance Overall balance assessment: Needs assistance Sitting-balance support: Feet supported;No upper extremity supported Sitting balance-Leahy Scale: Good Sitting balance - Comments: seated at EOB   Standing balance support: During functional activity Standing balance-Leahy Scale: Fair Standing balance comment: fair to good with RW                           ADL either performed or assessed with clinical judgement   ADL Overall ADL's : Modified independent;Independent  General ADL Comments: Pt able to don and doff socks without assist. Pt used RW this date to ambulate for toilet transfer and to stand at the sink.     Vision Baseline Vision/History: 1 Wears glasses Ability to See in Adequate Light: 1 Impaired Patient Visual Report: No change from baseline Vision Assessment?: No apparent visual deficits                Pertinent Vitals/Pain Pain Assessment: 0-10 Pain  Score: 8  Pain Location: back Pain Descriptors / Indicators: Aching Pain Intervention(s): Limited activity within patient's tolerance;Monitored during session;Repositioned     Hand Dominance Left (Pt reports being left handed but does everyingthing with her R hand.)   Extremity/Trunk Assessment Upper Extremity Assessment Upper Extremity Assessment: RUE deficits/detail;LUE deficits/detail RUE Deficits / Details: Limtied to ~110* A/ROM shoulder flexion and 90* shoulder abduction. Pt reports limitations due to muscular dystrophy and are baseline issues. Pt also has mild to moderate limitations for internal rotaion. RUE Coordination: WNL LUE Deficits / Details: Limtied to ~110* A/ROM shoulder flexion and 90* shoulder abduction. Pt reports limitations due to muscular dystrophy and are baseline issues. Pt also has mild to moderate limitations for internal rotaion. LUE Coordination: WNL   Lower Extremity Assessment Lower Extremity Assessment: Defer to PT evaluation   Cervical / Trunk Assessment Cervical / Trunk Assessment: Normal   Communication Communication Communication: No difficulties   Cognition Arousal/Alertness: Awake/alert Behavior During Therapy: WFL for tasks assessed/performed Overall Cognitive Status: Within Functional Limits for tasks assessed                                                        Home Living Family/patient expects to be discharged to:: Private residence Living Arrangements: Alone Available Help at Discharge: Neighbor;Available PRN/intermittently Type of Home: Apartment Home Access: Level entry     Home Layout: One level     Bathroom Shower/Tub: Teacher, early years/pre: Standard Bathroom Accessibility: Yes   Home Equipment: Cane - single point   Additional Comments: taken via previous PT note      Prior Functioning/Environment Prior Level of Function : Independent/Modified Independent              Mobility Comments: household and short distanced community ambulator using [facility] ADLs Comments: Pt reports independence for ADL's but with difficulty. Pt also reports difficulty with IADL's and is assisted at times for grocery shopping.                      OT Goals(Current goals can be found in the care plan section) Acute Rehab OT Goals Patient Stated Goal: return home   AM-PAC OT "6 Clicks" Daily Activity     Outcome Measure Help from another person eating meals?: None Help from another person taking care of personal grooming?: None Help from another person toileting, which includes using toliet, bedpan, or urinal?: None Help from another person bathing (including washing, rinsing, drying)?: None Help from another person to put on and taking off regular upper body clothing?: None Help from another person to put on and taking off regular lower body clothing?: None 6 Click Score: 24   End of Session Equipment Utilized During Treatment: Rolling walker (2 wheels);Oxygen (3L O2)  Activity Tolerance: Patient tolerated treatment well Patient left: in bed;with call  bell/phone within reach  OT Visit Diagnosis: Unsteadiness on feet (R26.81);Other abnormalities of gait and mobility (R26.89);History of falling (Z91.81);Muscle weakness (generalized) (M62.81)                Time: 0658-2608 OT Time Calculation (min): 15 min Charges:  OT General Charges $OT Visit: 1 Visit OT Evaluation $OT Eval Low Complexity: 1 Low  Ily Denno OT, MOT  Larey Seat 06/25/2021, 10:07 AM

## 2021-06-25 NOTE — Progress Notes (Signed)
Subjective:  Had large loose stool today with undigested food present. Feels dizzy, lightheaded with sitting and standing. Felt sob with ambulating, more than ususal.   Objective: Vital signs in last 24 hours: Temp:  [98.1 F (36.7 C)-98.9 F (37.2 C)] 98.7 F (37.1 C) (11/18 0504) Pulse Rate:  [68-77] 73 (11/18 0504) Resp:  [12-20] 19 (11/18 0504) BP: (120-166)/(31-88) 120/64 (11/18 0504) SpO2:  [97 %-100 %] 98 % (11/18 0928) Last BM Date: 06/25/21 General:   Alert,  Well-developed, well-nourished, pleasant and cooperative in NAD Head:  Normocephalic and atraumatic. Eyes:  Sclera clear, no icterus.  Abdomen:  Soft, nontender and nondistended. No masses, hepatosplenomegaly or hernias noted. Normal bowel sounds, without guarding, and without rebound.   Extremities:  Without clubbing, deformity or edema. Neurologic:  Alert and  oriented x4;  grossly normal neurologically. Skin:  Intact without significant lesions or rashes. Psych:  Alert and cooperative. Normal mood and affect.  Intake/Output from previous day: 11/17 0701 - 11/18 0700 In: 1543.5 [P.O.:1543.5] Out: 450 [Urine:450] Intake/Output this shift: Total I/O In: 0  Out: 900 [Urine:900]  Lab Results: CBC Recent Labs    06/23/21 1559 06/24/21 0414 06/24/21 0945 06/25/21 0600  WBC 9.6 8.1  --  7.1  HGB 5.8* 6.3* 7.5* 7.3*  HCT 20.9* 21.8* 24.3* 24.5*  MCV 79.2* 80.1  --  80.1  PLT 300 426*  --  372   BMET Recent Labs    06/23/21 1559 06/24/21 0414 06/25/21 0600  NA 135 136 137  K 3.5 4.2 5.1  CL 97* 98 99  CO2 26 30 31   GLUCOSE 165* 148* 148*  BUN 8 11 14   CREATININE 1.11* 1.21* 1.04*  CALCIUM 8.8* 8.3* 8.8*   LFTs Recent Labs    06/23/21 1559  BILITOT 0.5  ALKPHOS 96  AST 25  ALT 14  PROT 7.7  ALBUMIN 4.1   No results for input(s): LIPASE in the last 72 hours. PT/INR No results for input(s): LABPROT, INR in the last 72 hours.    Imaging Studies: DG Chest 2 View  Result Date:  06/06/2021 CLINICAL DATA:  Fall.  Knee pain EXAM: CHEST - 2 VIEW COMPARISON:  Chest CT 04/29/2021 FINDINGS: Normal cardiac silhouette. Small focus of airspace disease in the medial RIGHT lower lobe similar to comparison CT. No pneumothorax. No pulmonary contusion. no pleural fluid. No fracture. IMPRESSION: 1. No evidence of thoracic trauma. 2. Persistent/chronic/recurrent airspace disease in the RIGHT lower lobe. Electronically Signed   By: Suzy Bouchard M.D.   On: 06/06/2021 07:06   DG Elbow 2 Views Right  Result Date: 06/23/2021 CLINICAL DATA:  Pain, swelling posterior elbow EXAM: RIGHT ELBOW - 2 VIEW COMPARISON:  06/06/2021 FINDINGS: There is no evidence of fracture, dislocation, or joint effusion. There is no evidence of arthropathy or other focal bone abnormality. Soft tissues are unremarkable. IMPRESSION: Negative. Electronically Signed   By: Rolm Baptise M.D.   On: 06/23/2021 19:53   DG Elbow 2 Views Right  Result Date: 06/06/2021 CLINICAL DATA:  Fall EXAM: RIGHT ELBOW - 2 VIEW COMPARISON:  None. FINDINGS: No evidence of fracture of the ulna or humerus. The radial head is normal. No joint effusion. IMPRESSION: No fracture or dislocation. Electronically Signed   By: Suzy Bouchard M.D.   On: 06/06/2021 08:14   DG Elbow Complete Right  Result Date: 06/06/2021 CLINICAL DATA:  Fall last night with right elbow pain EXAM: RIGHT ELBOW - COMPLETE 3+ VIEW COMPARISON:  10/31/2019 FINDINGS: There is  no evidence of fracture, dislocation, or joint effusion. There is no evidence of arthropathy or other focal bone abnormality. Soft tissues are unremarkable. IMPRESSION: Negative. Electronically Signed   By: Jorje Guild M.D.   On: 06/06/2021 07:02   DG Abd 1 View  Result Date: 06/24/2021 CLINICAL DATA:  Crohn's disease with abdominal pain and bloating. EXAM: ABDOMEN - 1 VIEW COMPARISON:  Abdomen series 06/07/2019 FINDINGS: There are mildly dilated small bowel loops in left mid and lower abdomen  up to 3.5 cm caliber. Bowel gas and stool noted in the large intestine at least to the transverse segment. There are cholecystectomy clips. No pathologic calcifications are identified. Visceral shadows are stable. Lung bases are clear. IMPRESSION: Mildly dilated mid to lower abdominal small bowel segments, which could be due to an ileus or low-grade partial small bowel obstruction. Follow-up recommended. Electronically Signed   By: Telford Nab M.D.   On: 06/24/2021 00:01   CT Head Wo Contrast  Result Date: 06/06/2021 CLINICAL DATA:  Fall with head injury.  Initial encounter. EXAM: CT HEAD WITHOUT CONTRAST CT CERVICAL SPINE WITHOUT CONTRAST TECHNIQUE: Multidetector CT imaging of the head and cervical spine was performed following the standard protocol without intravenous contrast. Multiplanar CT image reconstructions of the cervical spine were also generated. COMPARISON:  12/12/2020 head CT FINDINGS: CT HEAD FINDINGS Brain: No evidence of acute infarction, hemorrhage, hydrocephalus, extra-axial collection or mass lesion/mass effect. Vascular: No hyperdense vessel or unexpected calcification. Skull: Left-sided scalp swelling.  No acute fracture Sinuses/Orbits: No evidence of injury CT CERVICAL SPINE FINDINGS Alignment: No traumatic malalignment. Degenerative reversal of cervical lordosis.Mild C4-5 anterolisthesis, degenerative appearing. Skull base and vertebrae: No acute fracture. Soft tissues and spinal canal: No prevertebral fluid or swelling. No visible canal hematoma. 17 mm right thyroid nodule which has enlarged since 2021. Accentuated supraclavicular fat and nodes, benign appearing given stability. Disc levels: Asymmetric left facet osteoarthritis with moderate to bulky spurring. Upper chest: Negative IMPRESSION: 1. No evidence of acute intracranial or cervical spine injury. 2. Left scalp contusion without calvarial fracture. 3. 17 mm right thyroid nodule which has enlarged since 2021. Recommend thyroid  US (ref: J Am Coll Radiol. 2015 Feb;12(2): 143-50). Electronically Signed   By: Jorje Guild M.D.   On: 06/06/2021 06:59   CT Cervical Spine Wo Contrast  Result Date: 06/06/2021 CLINICAL DATA:  Fall with head injury.  Initial encounter. EXAM: CT HEAD WITHOUT CONTRAST CT CERVICAL SPINE WITHOUT CONTRAST TECHNIQUE: Multidetector CT imaging of the head and cervical spine was performed following the standard protocol without intravenous contrast. Multiplanar CT image reconstructions of the cervical spine were also generated. COMPARISON:  12/12/2020 head CT FINDINGS: CT HEAD FINDINGS Brain: No evidence of acute infarction, hemorrhage, hydrocephalus, extra-axial collection or mass lesion/mass effect. Vascular: No hyperdense vessel or unexpected calcification. Skull: Left-sided scalp swelling.  No acute fracture Sinuses/Orbits: No evidence of injury CT CERVICAL SPINE FINDINGS Alignment: No traumatic malalignment. Degenerative reversal of cervical lordosis.Mild C4-5 anterolisthesis, degenerative appearing. Skull base and vertebrae: No acute fracture. Soft tissues and spinal canal: No prevertebral fluid or swelling. No visible canal hematoma. 17 mm right thyroid nodule which has enlarged since 2021. Accentuated supraclavicular fat and nodes, benign appearing given stability. Disc levels: Asymmetric left facet osteoarthritis with moderate to bulky spurring. Upper chest: Negative IMPRESSION: 1. No evidence of acute intracranial or cervical spine injury. 2. Left scalp contusion without calvarial fracture. 3. 17 mm right thyroid nodule which has enlarged since 2021. Recommend thyroid US (ref: J Am Coll  Radiol. 2015 Feb;12(2): 143-50). Electronically Signed   By: Jorje Guild M.D.   On: 06/06/2021 06:59   DG Chest Portable 1 View  Result Date: 06/23/2021 CLINICAL DATA:  Shortness of breath. EXAM: PORTABLE CHEST 1 VIEW COMPARISON:  Chest x-ray 06/06/2021. FINDINGS: There are minimal left basilar opacities. The lungs  are otherwise clear. No pleural effusion or pneumothorax. Cardiomediastinal silhouette within normal limits. No acute fractures. IMPRESSION: 1. Minimal left basilar atelectasis/airspace disease. Electronically Signed   By: Ronney Asters M.D.   On: 06/23/2021 16:25   DG Knee Complete 4 Views Left  Result Date: 06/06/2021 CLINICAL DATA:  Trauma, fall EXAM: LEFT KNEE - COMPLETE 4+ VIEW COMPARISON:  Study done earlier today FINDINGS: No evidence of fracture, dislocation, or joint effusion. Possible tiny bony spurs seen in patella. Soft tissues are unremarkable. IMPRESSION: No fracture or dislocation is seen. Electronically Signed   By: Elmer Picker M.D.   On: 06/06/2021 08:09   DG Knee Complete 4 Views Left  Result Date: 06/06/2021 CLINICAL DATA:  Fall with left knee pain.  Initial encounter. EXAM: LEFT KNEE - COMPLETE 4 VIEW COMPARISON:  None. FINDINGS: No evidence of fracture, dislocation, or joint effusion. No evidence of arthropathy or other focal bone abnormality. Soft tissues are unremarkable. IMPRESSION: Negative. Electronically Signed   By: Jorje Guild M.D.   On: 06/06/2021 07:01   DG Humerus Left  Result Date: 06/06/2021 CLINICAL DATA:  Fall last night with left arm pain. Initial encounter. EXAM: LEFT HUMERUS - 2+ VIEW COMPARISON:  10/31/2019 FINDINGS: [place] at [facility] joint. No acute fracture or subluxation. Negative soft tissues. IMPRESSION: No acute finding. Electronically Signed   By: Jorje Guild M.D.   On: 06/06/2021 07:00  [2 weeks]   Assessment: Pleasant 65 year old female with past medical history significant for COPD on 3 L of oxygen at home, dermatomyositis, GERD, questionable Crohn's, hypertension, CHF, A. fib, IDA with recent hospitalization requiring transfusion presenting with recurrent symptomatic anemia.  GI consulted for IDA.  IDA: Baseline hemoglobin has been in the upper 7 range.  Received 2 units of blood in September.  Presented this admission with  hemoglobin of 5.8.  Hemoglobin 7.3 today.  This admission she has received 2 units of packed red blood cells.  Reporting coffee appearing stool, multiple episodes a week ago.  EGD and colonoscopy in September unrevealing.  She takes aspirin 81 mg daily but no other NSAIDs.  She is chronically on PPI.  She would benefit from capsule endoscopy, currently undergoing agile study.  If capsule passes, will plan for her to swallow renal video capsule later today. Continues to have some lightheadedness, dizziness. Plans for transfusion if Hgb <7 or active bleeding.   Plan: Follow-up pending abdominal film today. Continue n.p.o. until we determine if she is swallowing the real video capsule this afternoon. Recheck H/H in am.  Patient wants to continue to follow Dr. Abbey Chatters as outpatient as she finds it difficult to get to [facility] to see prior GI.  Kayla Ryan. Kayla Ryan [facility] [phone] 11/18/202212:53 PM    LOS: 0 days

## 2021-06-26 ENCOUNTER — Inpatient Hospital Stay (HOSPITAL_COMMUNITY): Payer: Medicare Other

## 2021-06-26 DIAGNOSIS — D649 Anemia, unspecified: Secondary | ICD-10-CM

## 2021-06-26 DIAGNOSIS — J9611 Chronic respiratory failure with hypoxia: Secondary | ICD-10-CM | POA: Diagnosis not present

## 2021-06-26 DIAGNOSIS — K921 Melena: Secondary | ICD-10-CM

## 2021-06-26 DIAGNOSIS — I10 Essential (primary) hypertension: Secondary | ICD-10-CM

## 2021-06-26 DIAGNOSIS — J45909 Unspecified asthma, uncomplicated: Secondary | ICD-10-CM

## 2021-06-26 LAB — BASIC METABOLIC PANEL
Anion gap: 7 (ref 5–15)
BUN: 16 mg/dL (ref 8–23)
CO2: 31 mmol/L (ref 22–32)
Calcium: 8.7 mg/dL — ABNORMAL LOW (ref 8.9–10.3)
Chloride: 98 mmol/L (ref 98–111)
Creatinine, Ser: 1.05 mg/dL — ABNORMAL HIGH (ref 0.44–1.00)
GFR, Estimated: 58 mL/min — ABNORMAL LOW (ref >60)
Glucose, Bld: 99 mg/dL (ref 70–99)
Potassium: 4.8 mmol/L (ref 3.5–5.1)
Sodium: 136 mmol/L (ref 135–145)

## 2021-06-26 LAB — MAGNESIUM: Magnesium: 2.6 mg/dL — ABNORMAL HIGH (ref 1.7–2.4)

## 2021-06-26 LAB — CBC
HCT: 24.9 % — ABNORMAL LOW (ref 36.0–46.0)
Hemoglobin: 7.1 g/dL — ABNORMAL LOW (ref 12.0–15.0)
MCH: 23.7 pg — ABNORMAL LOW (ref 26.0–34.0)
MCHC: 28.5 g/dL — ABNORMAL LOW (ref 30.0–36.0)
MCV: 83 fL (ref 80.0–100.0)
Platelets: 354 10*3/uL (ref 150–400)
RBC: 3 MIL/uL — ABNORMAL LOW (ref 3.87–5.11)
RDW: 18.6 % — ABNORMAL HIGH (ref 11.5–15.5)
WBC: 6.4 10*3/uL (ref 4.0–10.5)
nRBC: 0 % (ref 0.0–0.2)

## 2021-06-26 LAB — PREPARE RBC (CROSSMATCH)

## 2021-06-26 IMAGING — CT CT CHEST HIGH RESOLUTION W/O CM
3 of 5 series · 14 of 36 positions shown, 15 images · non-contrast
Comparison: Chest CT 07/24/2020.
COMPARISON: Chest CT 07/24/2020.

Addendum:
CLINICAL DATA: 65-year-old female with history of shortness of
breath. Dyspnea. History of COPD.

EXAM:
CT CHEST WITHOUT CONTRAST
TECHNIQUE: Multidetector CT imaging of the chest was performed following the
standard protocol without intravenous contrast. High resolution
imaging of the lungs, as well as inspiratory and expiratory imaging,
was performed.

[Series 3: standard chest · axial · 0.69mm/px · z∈[-509,-263]mm · 8 of 153 slices shown]
[im 15/153  mediastinal]
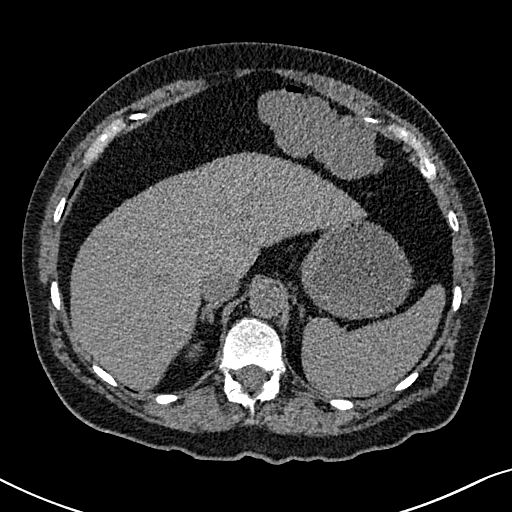
[im 29/153  mediastinal]
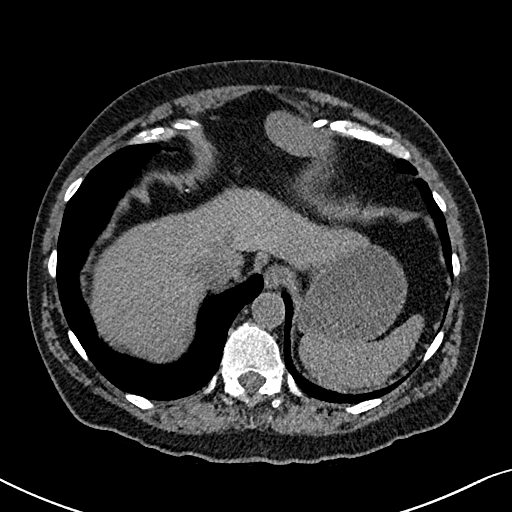
[im 51/153  mediastinal]
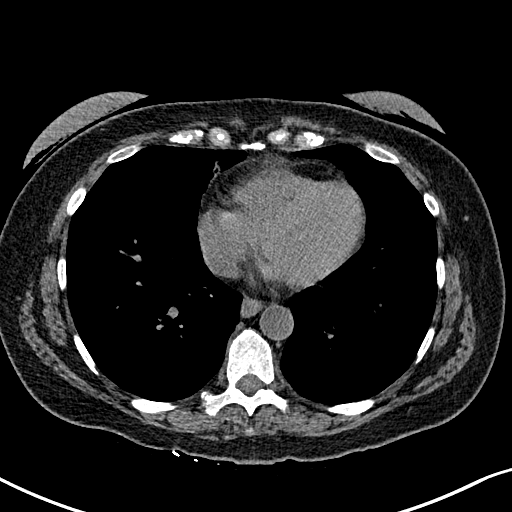
[im 66/153  mediastinal]
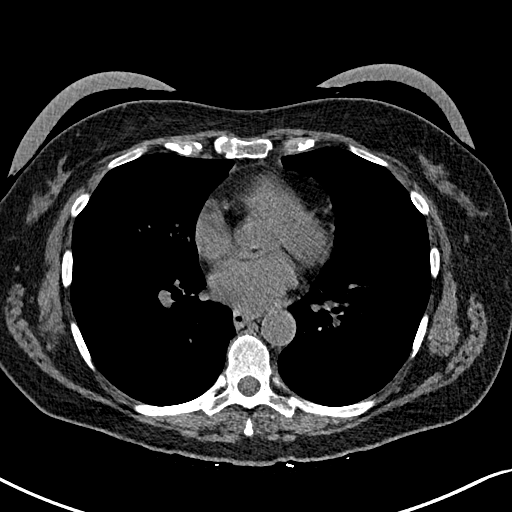
[im 87/153  mediastinal]
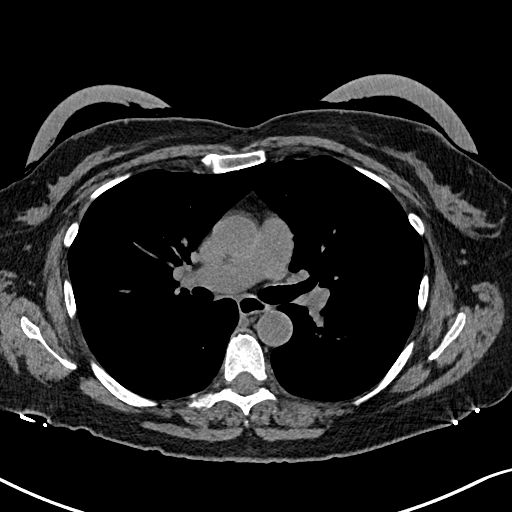
[im 102/153  mediastinal]
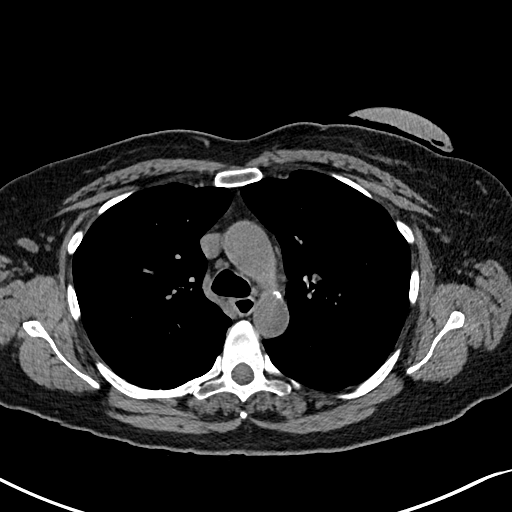
[im 124/153  mediastinal]
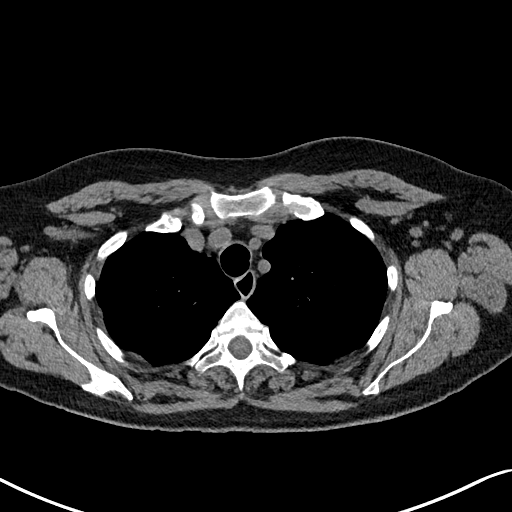
[im 138/153  mediastinal]
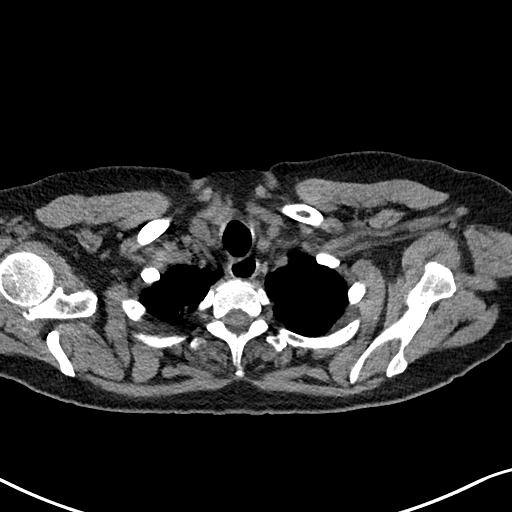

[Series 7: coronal · coronal · 0.63mm/px · 3 of 160 slices shown]
[im 32/160  lung]
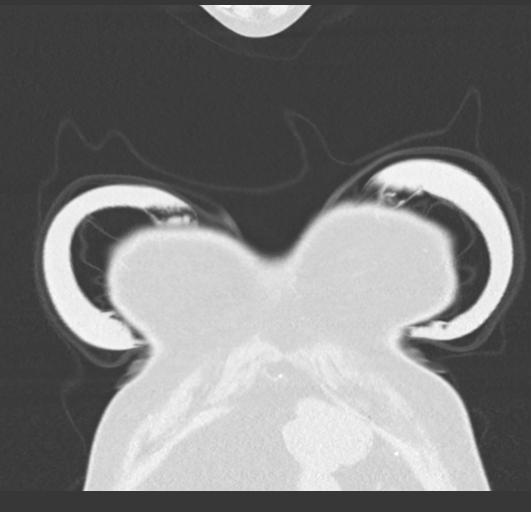
[im 64/160  lung]
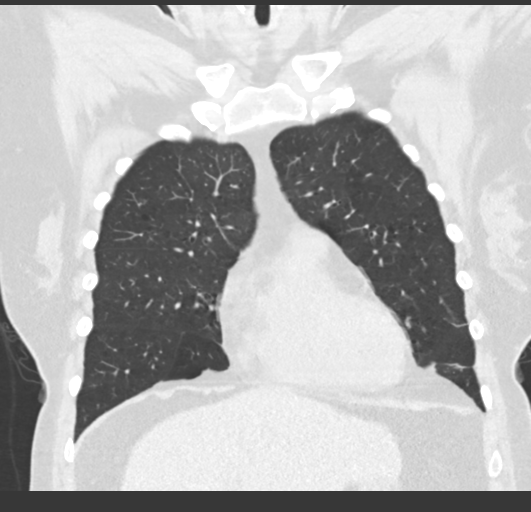
[im 96/160  lung]
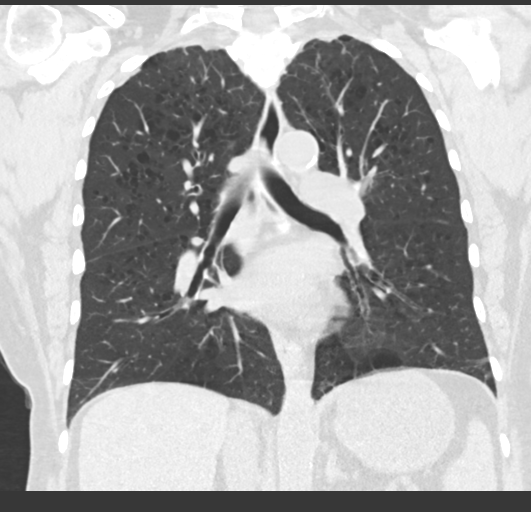

[Series 9: high res insp · axial · 0.69mm/px · z∈[-542,-241]mm · 3 of 22 slices shown, 4 images]
[im 1/22  mediastinal]
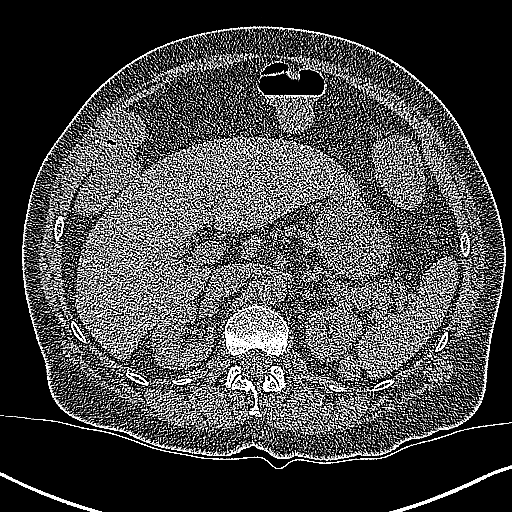
[im 1/22  lung]
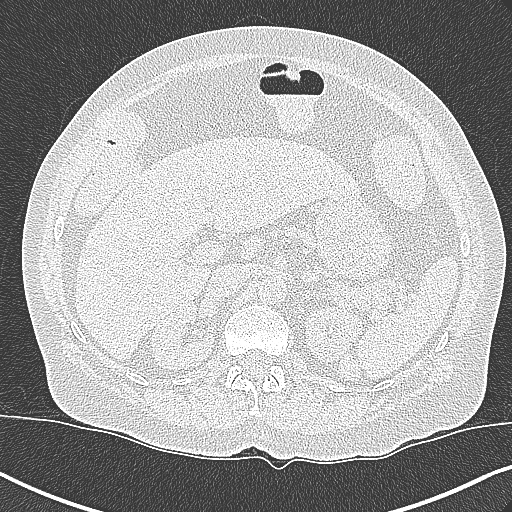
[im 11/22  lung]
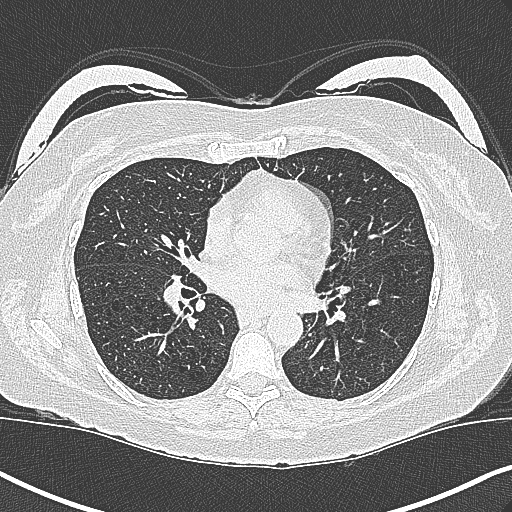
[im 22/22  lung]
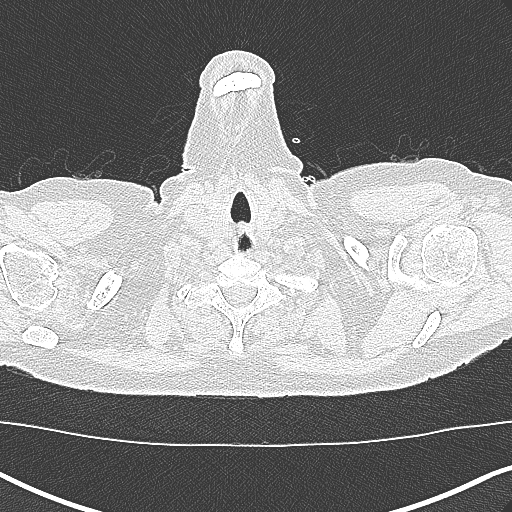

[14 of 36 positions shown; findings below may reference images not displayed]

FINDINGS: Cardiovascular: Heart size is normal. There is no significant
pericardial fluid, thickening or pericardial calcification. Aortic
atherosclerosis. No definite coronary artery calcifications.

Mediastinum/Nodes: No pathologically enlarged mediastinal or hilar
lymph nodes. Please note that accurate exclusion of hilar adenopathy
is limited on noncontrast CT scans. Esophagus is unremarkable in
appearance. No axillary lymphadenopathy.

Lungs/Pleura: There is a small amount of cylindrical bronchiectasis
and regional architectural distortion and volume loss in the medial
segment of the right middle lobe and inferior segment of the
lingula, most compatible with areas of chronic post infectious or
inflammatory scarring. High-resolution images otherwise demonstrate
no significant regions of ground-glass attenuation, septal
thickening, subpleural reticulation, parenchymal banding, traction
bronchiectasis or frank honeycombing to indicate interstitial lung
disease. Inspiratory and expiratory imaging is remarkable for
collapse of the trachea and mainstem bronchi during expiration
indicative of tracheobronchomalacia. Previously noted right upper
lobe pulmonary nodule (axial image 44 of series 5) currently
measures only 6 x 4 mm (mean diameter of 5 mm), decreased from 8 mm
on the prior study, presumably a benign post infectious or
inflammatory nodule. No other larger more suspicious appearing
pulmonary nodules or masses are noted. No acute consolidative
airspace disease. No pleural effusions. Diffuse bronchial wall
thickening with moderate centrilobular and mild paraseptal
emphysema.

Upper Abdomen: Aortic atherosclerosis.

Musculoskeletal: There are no aggressive appearing lytic or blastic
lesions noted in the visualized portions of the skeleton.
IMPRESSION: 1. No definitive imaging findings to suggest interstitial lung
disease.
2. Mild post infectious or inflammatory scarring in the medial
segment of the right middle lobe and inferior segment of the
lingula.
3. Diffuse bronchial wall thickening with moderate centrilobular and
mild paraseptal emphysema; imaging findings suggestive of underlying
COPD.
4. Previously demonstrated right upper lobe pulmonary nodule has
decreased in size, considered benign.
5. Aortic atherosclerosis.

Aortic Atherosclerosis (0FGV8-QGW.W) and Emphysema (0FGV8-JEM.Y).

ADDENDUM:
As mentioned in the body of the report, there is
tracheobronchomalacia.

*** End of Addendum ***
FINDINGS: Cardiovascular: Heart size is normal. There is no significant
pericardial fluid, thickening or pericardial calcification. Aortic
atherosclerosis. No definite coronary artery calcifications.

Mediastinum/Nodes: No pathologically enlarged mediastinal or hilar
lymph nodes. Please note that accurate exclusion of hilar adenopathy
is limited on noncontrast CT scans. Esophagus is unremarkable in
appearance. No axillary lymphadenopathy.

Lungs/Pleura: There is a small amount of cylindrical bronchiectasis
and regional architectural distortion and volume loss in the medial
segment of the right middle lobe and inferior segment of the
lingula, most compatible with areas of chronic post infectious or
inflammatory scarring. High-resolution images otherwise demonstrate
no significant regions of ground-glass attenuation, septal
thickening, subpleural reticulation, parenchymal banding, traction
bronchiectasis or frank honeycombing to indicate interstitial lung
disease. Inspiratory and expiratory imaging is remarkable for
collapse of the trachea and mainstem bronchi during expiration
indicative of tracheobronchomalacia. Previously noted right upper
lobe pulmonary nodule (axial image 44 of series 5) currently
measures only 6 x 4 mm (mean diameter of 5 mm), decreased from 8 mm
on the prior study, presumably a benign post infectious or
inflammatory nodule. No other larger more suspicious appearing
pulmonary nodules or masses are noted. No acute consolidative
airspace disease. No pleural effusions. Diffuse bronchial wall
thickening with moderate centrilobular and mild paraseptal
emphysema.

Upper Abdomen: Aortic atherosclerosis.

Musculoskeletal: There are no aggressive appearing lytic or blastic
lesions noted in the visualized portions of the skeleton.
IMPRESSION: 1. No definitive imaging findings to suggest interstitial lung
disease.
2. Mild post infectious or inflammatory scarring in the medial
segment of the right middle lobe and inferior segment of the
lingula.
3. Diffuse bronchial wall thickening with moderate centrilobular and
mild paraseptal emphysema; imaging findings suggestive of underlying
COPD.
4. Previously demonstrated right upper lobe pulmonary nodule has
decreased in size, considered benign.
5. Aortic atherosclerosis.

Aortic Atherosclerosis (0FGV8-QGW.W) and Emphysema (0FGV8-JEM.Y).

## 2021-06-26 MED ORDER — SIMETHICONE 80 MG PO CHEW
80.0000 mg | CHEWABLE_TABLET | Freq: Four times a day (QID) | ORAL | Status: AC
  Administered 2021-06-26 – 2021-06-27 (×4): 80 mg via ORAL
  Filled 2021-06-26 (×4): qty 1

## 2021-06-26 MED ORDER — PANTOPRAZOLE SODIUM 40 MG PO TBEC
40.0000 mg | DELAYED_RELEASE_TABLET | Freq: Every day | ORAL | Status: DC
  Administered 2021-06-27: 40 mg via ORAL
  Filled 2021-06-26: qty 1

## 2021-06-26 MED ORDER — SODIUM CHLORIDE 0.9% IV SOLUTION: Freq: Once | INTRAVENOUS | Status: AC

## 2021-06-26 MED ORDER — POLYETHYLENE GLYCOL 3350 17 G PO PACK
8.5000 g | PACK | Freq: Every day | ORAL | Status: DC
  Filled 2021-06-26: qty 1

## 2021-06-26 MED ORDER — FUROSEMIDE 10 MG/ML IJ SOLN
20.0000 mg | Freq: Once | INTRAMUSCULAR | Status: AC
  Administered 2021-06-26: 20 mg via INTRAVENOUS
  Filled 2021-06-26: qty 2

## 2021-06-26 NOTE — Plan of Care (Signed)

## 2021-06-26 NOTE — Progress Notes (Signed)
PROGRESS NOTE    Kayla Ryan  ZOX:[id number] DOB: 02/09/1956 DOA: 06/23/2021 PCP: Guadlupe Spanish, MD   Brief Narrative:   Patient 65 year old female history of atrial fibrillation, asthma, COPD with chronic respiratory failure, hypertension, Crohn's disease, chronic anemia, history of multiple transfusions was seen at PCPs office noted to have a low hemoglobin of 6.2 patient sent by EMS to the ED.  Patient noted to have weeks of generalized weakness, dyspnea on exertion.  Patient on presentation to the ED noted to have hemoglobin of 5.8.  Patient transfused 2 units packed red blood cells with improvements in hemoglobin levels noted, but this continues to be low.  She is being seen by GI with agile capsule evaluation pending and plans for potential capsule endoscopy after abdominal film later today.  She currently remains NPO.  Assessment & Plan:   Principal Problem:   Acute on chronic anemia Active Problems:   Iron deficiency anemia   Asthma   Chronic obstructive pulmonary disease/Emphysema   Dermatomyositis (HCC)   Essential hypertension   Chronic respiratory failure with hypoxia (HCC)   Symptomatic anemia   Abdominal bloating   Melena   #1 acute on chronic anemia/iron deficiency anemia/symptomatic anemia -Patient presented with hemoglobin of 5.8 from last discharge of 05/03/2021 hemoglobin noted at 7.8. -Patient with prior history of multiple blood transfusions. -Patient did report to admitting physician had some coffee colored stools approximately a week ago prior to admission.. -Patient seen by GI during last hospitalization noted to have EGD and colonoscopy 04/30/2021 with no etiology of bleed outpatient capsule endoscopy was recommended however not done yet. -Per GI service recommendations we will start advancing diet. -Continue to follow hemoglobin trend. -Hemoglobin 7.1 despite transfusion; 2 more units will be provided. -Continue PPI IV every 12 hours. -GI  consultation appreciated with dummy capsule for now and plans for capsule endoscopy as an outpatient. -Continue to hold aspirin  2.  Abdominal bloating -Patient noted to have abdominal bloating on presentation however denies any further abdominal pain, nausea or vomiting.  So far has tolerated liquid diet. -Bowel regimen as per GI service recommendations. -Appreciate GI assistance and recommendation -Hemoglobin down to 7.1 and is still symptomatic; 2 more units will be transfused. -Follow hemoglobin trend.  3.  Mechanical fall -Patient noted to have a mechanical fall prior to admission with persistent right elbow pain and swelling, noted patient tripped over her oxygen cord. -CT head plain films of the right elbow with no acute abnormalities. -Continue pain management without use of NSAIDs. -Continue supportive care. -PT recommending home health with rolling walker at  discharge  4.  Dyspnea -Patient with complaints of dyspnea on exertion with a stable persistent cough since treatment for pneumonia in  -Patient reported fevers at home currently afebrile with normal white count and a stable vital signs. -Chest x-ray done with minimal left base atelectasis versus healing airspace disease. -Continue reporting shortness of breath and weakness most likely in the setting of anemia -Continue holding antibiotic -Follow clinical response.  5.  COPD with chronic respiratory failure on 4 L nasal cannula -Stable. -Continue Singulair, PPI, Atrovent nebs. -Good saturation using 2-3 L nasal cannula supplementation at this time.  6.  Dermatomyositis/chronic pain -Being followed at [facility] home regimen xtampza ER and as needed oxycodone.  7.  Paroxysmal atrial fibrillation -Currently normal sinus rhythm. -Continue Cardizem for rate control. -Not on anticoagulation due to recurrent anemia. -Continue telemetry monitoring.  8.  Depression/anxiety -Continue home regimen  duloxetine, doxepin, Ativan as needed. -Overall mood is a stable.  9.  Hypertension -Continue home regimen Cardizem. -Low-sodium diet at discharge discussed with patient.     DVT prophylaxis: SCDs. Code Status: Full Family Communication: Discussed with sister on phone 11/18 Disposition:    Status is: Observation  The patient will require care spanning > 2 midnights and should be moved to inpatient because: Ongoing diagnostic testing required.      Consultants:  Gastroenterology    Procedures:  Transfusing 2 units packed red blood cells 06/23/2021-06/24/2021 Abdominal films 06/23/2021 Plain films of the right elbow 06/23/2021 Chest x-ray 06/23/2021     Antimicrobials: None    Subjective: So far has tolerated a clear liquid diet; no bowel movements or apparent bleeding.  Patient reports still feeling weak and tired.  Hemoglobin down to 7.1 after transfusion.    Objective: Vitals:   06/26/21 1454 06/26/21 1520 06/26/21 1536 06/26/21 1751  BP: (!) 141/61 (!) 137/50 (!) 154/58 (!) 160/63  Pulse: 78 78 81 77  Resp: 20 20 18 18   Temp: 98.9 F (37.2 C) 98.9 F (37.2 C) 98.9 F (37.2 C) 98.3 F (36.8 C)  TempSrc: Oral Oral Oral Oral  SpO2: 98% 99% 99% 100%  Weight:      Height:        Intake/Output Summary (Last 24 hours) at 06/26/2021 1932 Last data filed at 06/26/2021 1749 Gross per 24 hour  Intake 1757.76 ml  Output 902 ml  Net 855.76 ml   Filed Weights   06/23/21 2056  Weight: 84.8 kg    Examination: General exam: Alert, awake, oriented x 3; reports feeling weak and tired; no overt bleeding appreciated.  Hemoglobin down to 7.  No dizziness or lightheadedness Respiratory system: Clear to auscultation. Respiratory effort normal.  Good saturation with 2-3 L nasal cannula in place. Cardiovascular system:RRR. No murmurs, rubs, gallops.  No JVD. Gastrointestinal system: Abdomen is nondistended, soft and nontender. No organomegaly or masses felt. Normal bowel  sounds heard. Central nervous system: Alert and oriented. No focal neurological deficits. Extremities: No cyanosis, clubbing or edema. Skin: No petechiae. Psychiatry: Judgement and insight appear normal. Mood & affect appropriate.      Data Reviewed: I have personally reviewed following labs and imaging studies  CBC: Recent Labs  Lab 06/23/21 1559 06/24/21 0414 06/24/21 0945 06/25/21 0600 06/26/21 0649  WBC 9.6 8.1  --  7.1 6.4  HGB 5.8* 6.3* 7.5* 7.3* 7.1*  HCT 20.9* 21.8* 24.3* 24.5* 24.9*  MCV 79.2* 80.1  --  80.1 83.0  PLT 300 426*  --  372 409   Basic Metabolic Panel: Recent Labs  Lab 06/23/21 1559 06/24/21 0414 06/25/21 0600 06/26/21 0649  NA 135 136 137 136  K 3.5 4.2 5.1 4.8  CL 97* 98 99 98  CO2 26 30 31 31   GLUCOSE 165* 148* 148* 99  BUN 8 11 14 16   CREATININE 1.11* 1.21* 1.04* 1.05*  CALCIUM 8.8* 8.3* 8.8* 8.7*  MG  --   --  2.6* 2.6*   GFR: Estimated Creatinine Clearance: 60.4 mL/min (A) (by C-G formula based on SCr of 1.05 mg/dL (H)).  Liver Function Tests: Recent Labs  Lab 06/23/21 1559  AST 25  ALT 14  ALKPHOS 96  BILITOT 0.5  PROT 7.7  ALBUMIN 4.1   BNP (last 3 results) Recent Labs    01/28/21 1329  PROBNP 73.0    Recent Results (from the past 240 hour(s))  Resp Panel by RT-PCR (Flu A&B,  Covid) Nasopharyngeal Swab     Status: None   Collection Time: 06/23/21  6:20 PM   Specimen: Nasopharyngeal Swab; Nasopharyngeal(NP) swabs in vial transport medium  Result Value Ref Range Status   SARS Coronavirus 2 by RT PCR NEGATIVE NEGATIVE Final    Comment: (NOTE) SARS-CoV-2 target nucleic acids are NOT DETECTED.  The SARS-CoV-2 RNA is generally detectable in upper respiratory specimens during the acute phase of infection. The lowest concentration of SARS-CoV-2 viral copies this assay can detect is 138 copies/mL. A negative result does not preclude SARS-Cov-2 infection and should not be used as the sole basis for treatment or other  patient management decisions. A negative result may occur with  improper specimen collection/handling, submission of specimen other than nasopharyngeal swab, presence of viral mutation(s) within the areas targeted by this assay, and inadequate number of viral copies(<138 copies/mL). A negative result must be combined with clinical observations, patient history, and epidemiological information. The expected result is Negative.  Fact Sheet for Patients:  EntrepreneurPulse.com.au  Fact Sheet for Healthcare Providers:  IncredibleEmployment.be  This test is no t yet approved or cleared by the [place] FDA and  has been authorized for detection and/or diagnosis of SARS-CoV-2 by FDA under an Emergency Use Authorization (EUA). This EUA will remain  in effect (meaning this test can be used) for the duration of the COVID-19 declaration under Section 564(b)(1) of the Act, 21 U.S.C.section 360bbb-3(b)(1), unless the authorization is terminated  or revoked sooner.       Influenza A by PCR NEGATIVE NEGATIVE Final   Influenza B by PCR NEGATIVE NEGATIVE Final    Comment: (NOTE) The Xpert Xpress SARS-CoV-2/FLU/RSV plus assay is intended as an aid in the diagnosis of influenza from Nasopharyngeal swab specimens and should not be used as a sole basis for treatment. Nasal washings and aspirates are unacceptable for Xpert Xpress SARS-CoV-2/FLU/RSV testing.  Fact Sheet for Patients: EntrepreneurPulse.com.au  Fact Sheet for Healthcare Providers: IncredibleEmployment.be  This test is not yet approved or cleared by the [place] FDA and has been authorized for detection and/or diagnosis of SARS-CoV-2 by FDA under an Emergency Use Authorization (EUA). This EUA will remain in effect (meaning this test can be used) for the duration of the COVID-19 declaration under Section 564(b)(1) of the Act, 21 U.S.C. section  360bbb-3(b)(1), unless the authorization is terminated or revoked.  Performed at [facility], [address] [place], [place] [address]   MRSA Next Gen by PCR, Nasal     Status: None   Collection Time: 06/23/21  8:40 PM   Specimen: Nasal Mucosa; Nasal Swab  Result Value Ref Range Status   MRSA by PCR Next Gen NOT DETECTED NOT DETECTED Final    Comment: (NOTE) The GeneXpert MRSA Assay (FDA approved for NASAL specimens only), is one component of a comprehensive MRSA colonization surveillance program. It is not intended to diagnose MRSA infection nor to guide or monitor treatment for MRSA infections. Test performance is not FDA approved in patients less than 39 years old. Performed at [facility], [address] [place], [place] [address]      Radiology Studies: DG Abd 1 View  Result Date: 06/25/2021 CLINICAL DATA:  Localize endoscopy capsule EXAM: ABDOMEN - 1 VIEW COMPARISON:  None. FINDINGS: There is a rectangular metallic structure in the right side of pelvis at or below the level of inferior aspect of right SI joint. Bowel gas pattern is nonspecific. There is moderate gaseous distention of transverse colon. Possible surgical clip  is seen in the right upper quadrant. IMPRESSION: There is a foreign body, most likely endoscopy capsule in the right side of pelvis, possibly in the cecum or distal ileum or sigmoid colon. Bowel gas pattern is nonspecific. Electronically Signed   By: Elmer Picker M.D.   On: 06/25/2021 16:31   DG Abd 1 View - KUB  Addendum Date: 06/25/2021   ADDENDUM REPORT: 06/25/2021 14:44 ADDENDUM: Endoscopy capsule is seen in the right side of the pelvis, most likely within small bowel. Electronically Signed   By: Marijo Conception M.D.   On: 06/25/2021 14:44   Result Date: 06/25/2021 CLINICAL DATA:  Abdominal pain. EXAM: ABDOMEN - 1 VIEW COMPARISON:  June 23, 2021. FINDINGS: No definite small bowel dilatation is noted. Mildly dilated and air-filled large bowel  loops are noted which may represent ileus. No abnormal calcifications are noted. IMPRESSION: Possible colonic ileus.  No other abnormality seen. Electronically Signed: By: Marijo Conception M.D. On: 06/25/2021 14:16   DG ABD ACUTE 2+V W 1V CHEST  Result Date: 06/26/2021 CLINICAL DATA:  65 year old female with abdominal distension. Abdominal pain and nausea. EXAM: DG ABDOMEN ACUTE WITH 1 VIEW CHEST COMPARISON:  06/07/2019.  CT Abdomen and Pelvis 07/24/2020. FINDINGS: Portable AP upright view of the chest at 0630 hours. Mildly lower lung volumes. Normal cardiac size and mediastinal contours. Visualized tracheal air column is within normal limits. Mild chronic increased pulmonary interstitial markings are stable. Otherwise Allowing for portable technique the lungs are clear. No pneumothorax or pneumoperitoneum. Widespread gas throughout nondilated large and small bowel loops in the abdomen. Redundant transverse and left colon are distended up to 6 cm diameter. Paucity of gas in the rectum. No distension of the right colon or sigmoid is evident. Stable cholecystectomy clips. Other abdominal and pelvic visceral contours appear stable. No acute osseous abnormality identified. IMPRESSION: 1. Gas-filled small and large bowel loops throughout the abdomen. Transverse and left colon are mildly distended. Differential considerations include ileus (favored) versus distal large bowel obstruction. 2. No pneumoperitoneum. 3.  No acute cardiopulmonary abnormality. Electronically Signed   By: Genevie Ann M.D.   On: 06/26/2021 07:26     Scheduled Meds:  acyclovir  400 mg Oral BID   bisacodyl  10 mg Rectal Once   diltiazem  120 mg Oral Daily   doxepin  10 mg Oral QHS   DULoxetine  60 mg Oral BID   gabapentin  800 mg Oral TID   gemfibrozil  600 mg Oral BID AC   loratadine  10 mg Oral Daily   magnesium hydroxide  15 mL Oral QHS   montelukast  10 mg Oral Daily   oxyCODONE  15 mg Oral Q12H   [START ON 06/27/2021] pantoprazole   40 mg Oral QAC breakfast   polyethylene glycol  8.5 g Oral QHS   senna-docusate  1 tablet Oral BID   simethicone  80 mg Oral QID     LOS: 1 day    Time spent: 35 minutes    Barton Dubois, MD [facility]  If 7PM-7AM, please contact night-coverage [email or web address] 06/26/2021, 7:32 PM

## 2021-06-26 NOTE — Progress Notes (Signed)
Subjective:  Patient continues to complain of generalized abdominal pain but she has not experienced nausea or vomiting.  She is passing flatus.  No melena or rectal bleeding reported.  She says she has been walking in the hall and not getting lightheaded or dizzy.  Current Medications:  Current Facility-Administered Medications:    acetaminophen (TYLENOL) tablet 650 mg, 650 mg, Oral, Q6H PRN, 650 mg at 06/25/21 1157 **OR** acetaminophen (TYLENOL) suppository 650 mg, 650 mg, Rectal, Q6H PRN, Emokpae, Ejiroghene E, MD   acyclovir (ZOVIRAX) tablet 400 mg, 400 mg, Oral, BID, Emokpae, Ejiroghene E, MD, 400 mg at 06/26/21 1044   albuterol (PROVENTIL) (2.5 MG/3ML) 0.083% nebulizer solution 2.5 mg, 2.5 mg, Nebulization, Q6H PRN, Emokpae, Ejiroghene E, MD   bisacodyl (DULCOLAX) suppository 10 mg, 10 mg, Rectal, Once, Thompson, Daniel V, MD   diltiazem (CARDIZEM CD) 24 hr capsule 120 mg, 120 mg, Oral, Daily, Emokpae, Ejiroghene E, MD, 120 mg at 06/26/21 1044   diphenhydrAMINE (BENADRYL) capsule 25 mg, 25 mg, Oral, Q6H PRN, Thompson, Daniel V, MD, 25 mg at 06/26/21 0746   doxepin (SINEQUAN) capsule 10 mg, 10 mg, Oral, QHS, Emokpae, Ejiroghene E, MD, 10 mg at 06/25/21 2155   DULoxetine (CYMBALTA) DR capsule 60 mg, 60 mg, Oral, BID, Emokpae, Ejiroghene E, MD, 60 mg at 06/26/21 1044   fentaNYL (SUBLIMAZE) injection 25 mcg, 25 mcg, Intravenous, Q1H PRN, Shah, Pratik D, DO   furosemide (LASIX) injection 20 mg, 20 mg, Intravenous, Once, [place], Carlos, MD   gabapentin (NEURONTIN) capsule 800 mg, 800 mg, Oral, TID, Emokpae, Ejiroghene E, MD, 800 mg at 06/26/21 1044   gemfibrozil (LOPID) tablet 600 mg, 600 mg, Oral, BID AC, Emokpae, Ejiroghene E, MD, 600 mg at 06/26/21 0745   guaiFENesin-dextromethorphan (ROBITUSSIN DM) 100-10 MG/5ML syrup 5 mL, 5 mL, Oral, Q4H PRN, Adefeso, Oladapo, DO, 5 mL at 06/25/21 0338   loratadine (CLARITIN) tablet 10 mg, 10 mg, Oral, Daily, Emokpae, Ejiroghene E, MD, 10 mg at 06/26/21  1044   LORazepam (ATIVAN) tablet 0.5 mg, 0.5 mg, Oral, TID PRN, Emokpae, Ejiroghene E, MD, 0.5 mg at 06/26/21 0750   magnesium hydroxide (MILK OF MAGNESIA) suspension 15 mL, 15 mL, Oral, QHS, Emokpae, Ejiroghene E, MD, 15 mL at 06/25/21 2155   montelukast (SINGULAIR) tablet 10 mg, 10 mg, Oral, Daily, Emokpae, Ejiroghene E, MD, 10 mg at 06/25/21 2155   ondansetron (ZOFRAN) tablet 4 mg, 4 mg, Oral, Q6H PRN, 4 mg at 06/26/21 0745 **OR** ondansetron (ZOFRAN) injection 4 mg, 4 mg, Intravenous, Q6H PRN, Emokpae, Ejiroghene E, MD, 4 mg at 06/25/21 1830   oxyCODONE (Oxy IR/ROXICODONE) immediate release tablet 5 mg, 5 mg, Oral, Q4H PRN, Emokpae, Ejiroghene E, MD, 5 mg at 06/26/21 0745   oxyCODONE (OXYCONTIN) 12 hr tablet 15 mg, 15 mg, Oral, Q12H, Emokpae, Ejiroghene E, MD, 15 mg at 06/26/21 1043   [START ON 06/27/2021] pantoprazole (PROTONIX) EC tablet 40 mg, 40 mg, Oral, QAC breakfast, ,  U, MD   polyethylene glycol (MIRALAX / GLYCOLAX) packet 8.5 g, 8.5 g, Oral, QHS, ,  U, MD   senna-docusate (Senokot-S) tablet 1 tablet, 1 tablet, Oral, BID, Thompson, Daniel V, MD, 1 tablet at 06/26/21 1043   simethicone (MYLICON) chewable tablet 80 mg, 80 mg, Oral, QID, ,  U, MD   Objective: Blood pressure (!) 154/47, pulse 78, temperature 99.1 F (37.3 C), temperature source Oral, resp. rate 18, height 5' 9" (1.753 m), weight 84.8 kg, SpO2 99 %. Patient is alert and in no acute   distress. She appears pale. Abdomen is protuberant.  Bowel sounds are normal.  Percussion note is very tympanitic.  She has mild generalized tenderness without any guarding.  No organomegaly or masses. No LE edema or clubbing noted.  Labs/studies Results:   CBC Latest Ref Rng & Units 06/26/2021 06/25/2021 06/24/2021  WBC 4.0 - 10.5 K/uL 6.4 7.1 -  Hemoglobin 12.0 - 15.0 g/dL 7.1(L) 7.3(L) 7.5(L)  Hematocrit 36.0 - 46.0 % 24.9(L) 24.5(L) 24.3(L)  Platelets 150 - 400 K/uL 354 372 -    CMP Latest Ref  Rng & Units 06/26/2021 06/25/2021 06/24/2021  Glucose 70 - 99 mg/dL 99 148(H) 148(H)  BUN 8 - 23 mg/dL 16 14 11  Creatinine 0.44 - 1.00 mg/dL 1.05(H) 1.04(H) 1.21(H)  Sodium 135 - 145 mmol/L 136 137 136  Potassium 3.5 - 5.1 mmol/L 4.8 5.1 4.2  Chloride 98 - 111 mmol/L 98 99 98  CO2 22 - 32 mmol/L 31 31 30  Calcium 8.9 - 10.3 mg/dL 8.7(L) 8.8(L) 8.3(L)  Total Protein 6.5 - 8.1 g/dL - - -  Total Bilirubin 0.3 - 1.2 mg/dL - - -  Alkaline Phos 38 - 126 U/L - - -  AST 15 - 41 U/L - - -  ALT 0 - 44 U/L - - -    Hepatic Function Latest Ref Rng & Units 06/23/2021 07/24/2020 01/03/2020  Total Protein 6.5 - 8.1 g/dL 7.7 7.7 6.9  Albumin 3.5 - 5.0 g/dL 4.1 4.1 3.8  AST 15 - 41 U/L 25 47(H) 27  ALT 0 - 44 U/L 14 34 20  Alk Phosphatase 38 - 126 U/L 96 73 59  Total Bilirubin 0.3 - 1.2 mg/dL 0.5 0.7 0.8  Bilirubin, Direct 0.0 - 0.2 mg/dL - 0.1 -    Acute abdominal series reviewed.  Agile capsule is in terminal ileum.  Gaseous distention to small or large bowel.  No air-fluid levels.   Assessment:  #1.  Iron deficiency anemia.  Patient presented with history of melena and low hemoglobin.  She has received 2 units of PRBCs.  Hemoglobin is low and she will receive transfusion today as well.  No evidence of overt GI bleed.  She had EGD and colonoscopy in September 2022 and no bleeding lesion was identified.  History of Crohn's disease.  Therefore small bowel capsule study planned but not completed because agile capsule did not clear small bowel concerning for stricture but she could also have dysmotility.  Screening for celiac disease 2 months ago was negative.  #2.  Abdominal pain.  Suspect pain may be due to constipation.  Remains to be seen if she has inflammatory bowel disease.  #3.  Chronic constipation.  Patient takes 30 mL of MOM at home every night.  Here she is receiving Senokot and polyethylene glycol on as-needed basis.  Plan:  Change polyethylene glycol to 8.5 g p.o. daily. Mylicon 80  mg p.o. 4 times daily x4 doses. CBC in AM. Patient advised not to take milk of magnesia since she has chronic kidney disease. Will plan to repeat study with agile capsule on an outpatient basis before considering small bowel given capsule study.      

## 2021-06-27 DIAGNOSIS — D649 Anemia, unspecified: Secondary | ICD-10-CM | POA: Diagnosis not present

## 2021-06-27 LAB — TYPE AND SCREEN
ABO/RH(D): A NEG
Antibody Screen: NEGATIVE
Unit division: 0
Unit division: 0
Unit division: 0
Unit division: 0

## 2021-06-27 LAB — CBC
HCT: 32 % — ABNORMAL LOW (ref 36.0–46.0)
Hemoglobin: 9.5 g/dL — ABNORMAL LOW (ref 12.0–15.0)
MCH: 24.2 pg — ABNORMAL LOW (ref 26.0–34.0)
MCHC: 29.7 g/dL — ABNORMAL LOW (ref 30.0–36.0)
MCV: 81.6 fL (ref 80.0–100.0)
Platelets: 323 10*3/uL (ref 150–400)
RBC: 3.92 MIL/uL (ref 3.87–5.11)
RDW: 19 % — ABNORMAL HIGH (ref 11.5–15.5)
WBC: 6.9 10*3/uL (ref 4.0–10.5)
nRBC: 0 % (ref 0.0–0.2)

## 2021-06-27 LAB — BPAM RBC
Blood Product Expiration Date: 202211282359
Blood Product Expiration Date: 202211282359
Blood Product Expiration Date: 202212032359
Blood Product Expiration Date: 202212172359
ISSUE DATE / TIME: 202211162134
ISSUE DATE / TIME: 202211170426
ISSUE DATE / TIME: 202211191149
ISSUE DATE / TIME: 202211191515
Unit Type and Rh: 600
Unit Type and Rh: 600
Unit Type and Rh: 600
Unit Type and Rh: 600

## 2021-06-27 MED ORDER — SIMETHICONE 80 MG PO CHEW: 80.0000 mg | CHEWABLE_TABLET | Freq: Four times a day (QID) | ORAL | 0 refills | Status: DC | PRN

## 2021-06-27 MED ORDER — SENNOSIDES-DOCUSATE SODIUM 8.6-50 MG PO TABS: 1.0000 | ORAL_TABLET | Freq: Two times a day (BID) | ORAL | 0 refills | Status: AC

## 2021-06-27 MED ORDER — POLYETHYLENE GLYCOL 3350 17 G PO PACK: 8.5000 g | PACK | Freq: Every day | ORAL | 0 refills | Status: DC

## 2021-06-27 NOTE — Progress Notes (Signed)
Patient has no complaints. She denies melena or rectal bleeding. She reports simethicone helps with her bloating. Posttransfusion H&H is 9.5 and 32.0.  Patient stable for discharge from GI standpoint. Patient will return to endoscopy on 06/30/2021 and will be given agile capsule again and then she will return to x-ray department for plain abdominal film 30 hours later.  My office will schedule this.

## 2021-06-27 NOTE — Discharge Summary (Signed)
Physician Discharge Summary  Kayla Ryan VZD:[id number] DOB: March 18, 1956 DOA: 06/23/2021  PCP: Guadlupe Spanish, MD  Admit date: 06/23/2021  Discharge date: 06/27/2021  Admitted From:Home  Disposition:  Home  Recommendations for Outpatient Follow-up:  Follow up with PCP in 1-2 weeks Please obtain BMP/CBC in one week Follow up with GI as scheduled in one week for Agile capsule Continue MiraLAX daily and Mylicon as needed for bloating and flatulence Hold further use of milk of magnesia Hold aspirin use for now Continue other home medications as prior  Home Health: Yes  Equipment/Devices: Has chronic home oxygen  Discharge Condition:Stable  CODE STATUS: Full  Diet recommendation: Heart Healthy  Brief/Interim Summary: Patient 65 year old female history of atrial fibrillation, asthma, COPD with chronic respiratory failure, hypertension, Crohn's disease, chronic anemia, history of multiple transfusions was seen at PCPs office noted to have a low hemoglobin of 6.2 patient sent by EMS to the ED.  Patient noted to have weeks of generalized weakness, dyspnea on exertion.  Patient on presentation to the ED noted to have hemoglobin of 5.8.  Patient transfused 2 units packed red blood cells with improvements in hemoglobin levels noted, but this continued to be low and therefore, she received another unit 2 unit PRBC transfusion on 11/19.  Agile capsule given by GI, but she continued to retain the capsule in her distal small bowel on repeat KUB.  Therefore, her follow-up capsule study was abandoned.  Plan at this time is to follow-up in the outpatient setting to repeat capsule study in the next week.  She has had no further overt bleeding and is otherwise tolerating diet.  She has been having some ongoing bloating for which she has been started on MiraLAX and Mylicon as noted above.  No other acute events noted throughout the course of this admission and she is stable for discharge.  Discharge  Diagnoses:  Principal Problem:   Acute on chronic anemia Active Problems:   Iron deficiency anemia   Asthma   Chronic obstructive pulmonary disease/Emphysema   Dermatomyositis (HCC)   Essential hypertension   Chronic respiratory failure with hypoxia (HCC)   Symptomatic anemia   Abdominal bloating   Melena  Principal discharge diagnosis: Acute on chronic iron deficiency anemia with some combined blood loss status post total 4 unit PRBC transfusion.  Chronic abdominal pain with constipation potentially related to IBD.  Discharge Instructions  Discharge Instructions     Diet - low sodium heart healthy   Complete by: As directed    Increase activity slowly   Complete by: As directed       Allergies as of 06/27/2021       Reactions   Sulfa Antibiotics Shortness Of Breath, Swelling, Rash   Dilaudid [hydromorphone Hcl]    Makes me crazy   Iron    Throat starts closing up, tongue swells, severe headaches and backpain   Metronidazole Diarrhea, Nausea And Vomiting   Prednisone Other (See Comments)   angry angry   Cephalexin Diarrhea, Nausea And Vomiting   Methotrexate Derivatives Swelling, Rash   Morphine And Related Anxiety   "exreme mood swings"        Medication List     STOP taking these medications    aspirin 81 MG EC tablet Commonly known as: Aspirin 81   Belsomra 20 MG Tabs Generic drug: Suvorexant   doxycycline 100 MG tablet Commonly known as: VIBRA-TABS   magnesium hydroxide 400 MG/5ML suspension Commonly known as: MILK OF MAGNESIA   valACYclovir  500 MG tablet Commonly known as: VALTREX       TAKE these medications    acyclovir 400 MG tablet Commonly known as: ZOVIRAX Take 400 mg by mouth 2 (two) times daily.   albuterol (2.5 MG/3ML) 0.083% nebulizer solution Commonly known as: PROVENTIL Take 2.5 mg by nebulization every 6 (six) hours as needed for wheezing or shortness of breath.   cetirizine 10 MG tablet Commonly known as: ZYRTEC Take  10 mg by mouth daily.   diltiazem 120 MG 24 hr capsule Commonly known as: Cardizem CD Take 1 capsule (120 mg total) by mouth daily.   doxepin 10 MG capsule Commonly known as: SINEQUAN Take 10 mg by mouth at bedtime.   DULoxetine 60 MG capsule Commonly known as: CYMBALTA Take 1 capsule (60 mg total) by mouth 2 (two) times daily.   folic acid 1 MG tablet Commonly known as: FOLVITE Take 1 tablet (1 mg total) by mouth daily.   gabapentin 800 MG tablet Commonly known as: NEURONTIN Take 800 mg by mouth 3 (three) times daily. What changed: Another medication with the same name was removed. Continue taking this medication, and follow the directions you see here.   gemfibrozil 600 MG tablet Commonly known as: LOPID Take 600 mg by mouth 2 (two) times daily before a meal.   ipratropium 17 MCG/ACT inhaler Commonly known as: ATROVENT HFA Inhale 2 puffs into the lungs every 6 (six) hours.   LORazepam 0.5 MG tablet Commonly known as: ATIVAN Take 1 tablet (0.5 mg total) by mouth 3 (three) times daily as needed for anxiety.   montelukast 10 MG tablet Commonly known as: SINGULAIR Take 10 mg by mouth daily.   Nitrostat 0.4 MG SL tablet Generic drug: nitroGLYCERIN Place 0.4 mg under the tongue every 15 (fifteen) minutes as needed for chest pain.   omeprazole 40 MG capsule Commonly known as: PRILOSEC Take 40 mg by mouth 2 (two) times daily.   ondansetron 4 MG disintegrating tablet Commonly known as: ZOFRAN-ODT Take 2 mg by mouth every 6 (six) hours as needed.   oxyCODONE 5 MG immediate release tablet Commonly known as: Oxy IR/ROXICODONE Take 1 tablet (5 mg total) by mouth every 4 (four) hours as needed for moderate pain or severe pain.   pantoprazole 40 MG tablet Commonly known as: PROTONIX Take 1 tablet (40 mg total) by mouth 2 (two) times daily.   polyethylene glycol 17 g packet Commonly known as: MIRALAX / GLYCOLAX Take 8.5 g by mouth at bedtime.   senna-docusate 8.6-50  MG tablet Commonly known as: Senokot-S Take 1 tablet by mouth 2 (two) times daily.   simethicone 80 MG chewable tablet Commonly known as: Gas-X Chew 1 tablet (80 mg total) by mouth every 6 (six) hours as needed for flatulence.   Stiolto Respimat 2.5-2.5 MCG/ACT Aers Generic drug: Tiotropium Bromide-Olodaterol Inhale 2 puffs into the lungs daily.   Xtampza ER 18 MG C12a Generic drug: oxyCODONE ER Take 1 capsule by mouth 2 (two) times daily.        Follow-up Information     [facility] Follow up.   Why: Home health        Guadlupe Spanish, MD. Schedule an appointment as soon as possible for a visit in 1 week(s).   Specialty: Internal Medicine Contact information: [place] [address] [phone]         [place]. Go in 1 week(s).   Contact information: [place]  [place] [address] [phone]               Allergies  Allergen Reactions   Sulfa Antibiotics Shortness Of Breath, Swelling and Rash   Dilaudid [Hydromorphone Hcl]     Makes me crazy   Iron     Throat starts closing up, tongue swells, severe headaches and backpain   Metronidazole Diarrhea and Nausea And Vomiting   Prednisone Other (See Comments)    angry angry    Cephalexin Diarrhea and Nausea And Vomiting   Methotrexate Derivatives Swelling and Rash   Morphine And Related Anxiety    "exreme mood swings"    Consultations: Gastroenterology   Procedures/Studies: DG Chest 2 View  Result Date: 06/06/2021 CLINICAL DATA:  Fall.  Knee pain EXAM: CHEST - 2 VIEW COMPARISON:  Chest CT 04/29/2021 FINDINGS: Normal cardiac silhouette. Small focus of airspace disease in the medial RIGHT lower lobe similar to comparison CT. No pneumothorax. No pulmonary contusion. no pleural fluid. No fracture. IMPRESSION: 1. No evidence of thoracic trauma. 2. Persistent/chronic/recurrent airspace disease in the RIGHT lower lobe.  Electronically Signed   By: Suzy Bouchard M.D.   On: 06/06/2021 07:06   DG Elbow 2 Views Right  Result Date: 06/23/2021 CLINICAL DATA:  Pain, swelling posterior elbow EXAM: RIGHT ELBOW - 2 VIEW COMPARISON:  06/06/2021 FINDINGS: There is no evidence of fracture, dislocation, or joint effusion. There is no evidence of arthropathy or other focal bone abnormality. Soft tissues are unremarkable. IMPRESSION: Negative. Electronically Signed   By: Rolm Baptise M.D.   On: 06/23/2021 19:53   DG Elbow 2 Views Right  Result Date: 06/06/2021 CLINICAL DATA:  Fall EXAM: RIGHT ELBOW - 2 VIEW COMPARISON:  None. FINDINGS: No evidence of fracture of the ulna or humerus. The radial head is normal. No joint effusion. IMPRESSION: No fracture or dislocation. Electronically Signed   By: Suzy Bouchard M.D.   On: 06/06/2021 08:14   DG Elbow Complete Right  Result Date: 06/06/2021 CLINICAL DATA:  Fall last night with right elbow pain EXAM: RIGHT ELBOW - COMPLETE 3+ VIEW COMPARISON:  10/31/2019 FINDINGS: There is no evidence of fracture, dislocation, or joint effusion. There is no evidence of arthropathy or other focal bone abnormality. Soft tissues are unremarkable. IMPRESSION: Negative. Electronically Signed   By: Jorje Guild M.D.   On: 06/06/2021 07:02   DG Abd 1 View  Result Date: 06/25/2021 CLINICAL DATA:  Localize endoscopy capsule EXAM: ABDOMEN - 1 VIEW COMPARISON:  None. FINDINGS: There is a rectangular metallic structure in the right side of pelvis at or below the level of inferior aspect of right SI joint. Bowel gas pattern is nonspecific. There is moderate gaseous distention of transverse colon. Possible surgical clip is seen in the right upper quadrant. IMPRESSION: There is a foreign body, most likely endoscopy capsule in the right side of pelvis, possibly in the cecum or distal ileum or sigmoid colon. Bowel gas pattern is nonspecific. Electronically Signed   By: Elmer Picker M.D.   On:  06/25/2021 16:31   DG Abd 1 View - KUB  Addendum Date: 06/25/2021   ADDENDUM REPORT: 06/25/2021 14:44 ADDENDUM: Endoscopy capsule is seen in the right side of the pelvis, most likely within small bowel. Electronically Signed   By: Marijo Conception M.D.   On: 06/25/2021 14:44   Result Date: 06/25/2021 CLINICAL DATA:  Abdominal pain. EXAM: ABDOMEN - 1 VIEW COMPARISON:  June 23, 2021. FINDINGS: No definite small bowel dilatation is noted. Mildly dilated  and air-filled large bowel loops are noted which may represent ileus. No abnormal calcifications are noted. IMPRESSION: Possible colonic ileus.  No other abnormality seen. Electronically Signed: By: Marijo Conception M.D. On: 06/25/2021 14:16   DG Abd 1 View  Result Date: 06/24/2021 CLINICAL DATA:  Crohn's disease with abdominal pain and bloating. EXAM: ABDOMEN - 1 VIEW COMPARISON:  Abdomen series 06/07/2019 FINDINGS: There are mildly dilated small bowel loops in left mid and lower abdomen up to 3.5 cm caliber. Bowel gas and stool noted in the large intestine at least to the transverse segment. There are cholecystectomy clips. No pathologic calcifications are identified. Visceral shadows are stable. Lung bases are clear. IMPRESSION: Mildly dilated mid to lower abdominal small bowel segments, which could be due to an ileus or low-grade partial small bowel obstruction. Follow-up recommended. Electronically Signed   By: Telford Nab M.D.   On: 06/24/2021 00:01   CT Head Wo Contrast  Result Date: 06/06/2021 CLINICAL DATA:  Fall with head injury.  Initial encounter. EXAM: CT HEAD WITHOUT CONTRAST CT CERVICAL SPINE WITHOUT CONTRAST TECHNIQUE: Multidetector CT imaging of the head and cervical spine was performed following the standard protocol without intravenous contrast. Multiplanar CT image reconstructions of the cervical spine were also generated. COMPARISON:  12/12/2020 head CT FINDINGS: CT HEAD FINDINGS Brain: No evidence of acute infarction,  hemorrhage, hydrocephalus, extra-axial collection or mass lesion/mass effect. Vascular: No hyperdense vessel or unexpected calcification. Skull: Left-sided scalp swelling.  No acute fracture Sinuses/Orbits: No evidence of injury CT CERVICAL SPINE FINDINGS Alignment: No traumatic malalignment. Degenerative reversal of cervical lordosis.Mild C4-5 anterolisthesis, degenerative appearing. Skull base and vertebrae: No acute fracture. Soft tissues and spinal canal: No prevertebral fluid or swelling. No visible canal hematoma. 17 mm right thyroid nodule which has enlarged since 2021. Accentuated supraclavicular fat and nodes, benign appearing given stability. Disc levels: Asymmetric left facet osteoarthritis with moderate to bulky spurring. Upper chest: Negative IMPRESSION: 1. No evidence of acute intracranial or cervical spine injury. 2. Left scalp contusion without calvarial fracture. 3. 17 mm right thyroid nodule which has enlarged since 2021. Recommend thyroid US (ref: J Am Coll Radiol. 2015 Feb;12(2): 143-50). Electronically Signed   By: Jorje Guild M.D.   On: 06/06/2021 06:59   CT Cervical Spine Wo Contrast  Result Date: 06/06/2021 CLINICAL DATA:  Fall with head injury.  Initial encounter. EXAM: CT HEAD WITHOUT CONTRAST CT CERVICAL SPINE WITHOUT CONTRAST TECHNIQUE: Multidetector CT imaging of the head and cervical spine was performed following the standard protocol without intravenous contrast. Multiplanar CT image reconstructions of the cervical spine were also generated. COMPARISON:  12/12/2020 head CT FINDINGS: CT HEAD FINDINGS Brain: No evidence of acute infarction, hemorrhage, hydrocephalus, extra-axial collection or mass lesion/mass effect. Vascular: No hyperdense vessel or unexpected calcification. Skull: Left-sided scalp swelling.  No acute fracture Sinuses/Orbits: No evidence of injury CT CERVICAL SPINE FINDINGS Alignment: No traumatic malalignment. Degenerative reversal of cervical lordosis.Mild  C4-5 anterolisthesis, degenerative appearing. Skull base and vertebrae: No acute fracture. Soft tissues and spinal canal: No prevertebral fluid or swelling. No visible canal hematoma. 17 mm right thyroid nodule which has enlarged since 2021. Accentuated supraclavicular fat and nodes, benign appearing given stability. Disc levels: Asymmetric left facet osteoarthritis with moderate to bulky spurring. Upper chest: Negative IMPRESSION: 1. No evidence of acute intracranial or cervical spine injury. 2. Left scalp contusion without calvarial fracture. 3. 17 mm right thyroid nodule which has enlarged since 2021. Recommend thyroid US (ref: J Am Coll Radiol. 2015 Feb;12(2):  143-50). Electronically Signed   By: Jorje Guild M.D.   On: 06/06/2021 06:59   DG Chest Portable 1 View  Result Date: 06/23/2021 CLINICAL DATA:  Shortness of breath. EXAM: PORTABLE CHEST 1 VIEW COMPARISON:  Chest x-ray 06/06/2021. FINDINGS: There are minimal left basilar opacities. The lungs are otherwise clear. No pleural effusion or pneumothorax. Cardiomediastinal silhouette within normal limits. No acute fractures. IMPRESSION: 1. Minimal left basilar atelectasis/airspace disease. Electronically Signed   By: Ronney Asters M.D.   On: 06/23/2021 16:25   DG Knee Complete 4 Views Left  Result Date: 06/06/2021 CLINICAL DATA:  Trauma, fall EXAM: LEFT KNEE - COMPLETE 4+ VIEW COMPARISON:  Study done earlier today FINDINGS: No evidence of fracture, dislocation, or joint effusion. Possible tiny bony spurs seen in patella. Soft tissues are unremarkable. IMPRESSION: No fracture or dislocation is seen. Electronically Signed   By: Elmer Picker M.D.   On: 06/06/2021 08:09   DG Knee Complete 4 Views Left  Result Date: 06/06/2021 CLINICAL DATA:  Fall with left knee pain.  Initial encounter. EXAM: LEFT KNEE - COMPLETE 4 VIEW COMPARISON:  None. FINDINGS: No evidence of fracture, dislocation, or joint effusion. No evidence of arthropathy or other  focal bone abnormality. Soft tissues are unremarkable. IMPRESSION: Negative. Electronically Signed   By: Jorje Guild M.D.   On: 06/06/2021 07:01   DG ABD ACUTE 2+V W 1V CHEST  Result Date: 06/26/2021 CLINICAL DATA:  65 year old female with abdominal distension. Abdominal pain and nausea. EXAM: DG ABDOMEN ACUTE WITH 1 VIEW CHEST COMPARISON:  06/07/2019.  CT Abdomen and Pelvis 07/24/2020. FINDINGS: Portable AP upright view of the chest at 0630 hours. Mildly lower lung volumes. Normal cardiac size and mediastinal contours. Visualized tracheal air column is within normal limits. Mild chronic increased pulmonary interstitial markings are stable. Otherwise Allowing for portable technique the lungs are clear. No pneumothorax or pneumoperitoneum. Widespread gas throughout nondilated large and small bowel loops in the abdomen. Redundant transverse and left colon are distended up to 6 cm diameter. Paucity of gas in the rectum. No distension of the right colon or sigmoid is evident. Stable cholecystectomy clips. Other abdominal and pelvic visceral contours appear stable. No acute osseous abnormality identified. IMPRESSION: 1. Gas-filled small and large bowel loops throughout the abdomen. Transverse and left colon are mildly distended. Differential considerations include ileus (favored) versus distal large bowel obstruction. 2. No pneumoperitoneum. 3.  No acute cardiopulmonary abnormality. Electronically Signed   By: Genevie Ann M.D.   On: 06/26/2021 07:26   DG Humerus Left  Result Date: 06/06/2021 CLINICAL DATA:  Fall last night with left arm pain. Initial encounter. EXAM: LEFT HUMERUS - 2+ VIEW COMPARISON:  10/31/2019 FINDINGS: [place] [facility] joint. No acute fracture or subluxation. Negative soft tissues. IMPRESSION: No acute finding. Electronically Signed   By: Jorje Guild M.D.   On: 06/06/2021 07:00     Discharge Exam: Vitals:   06/26/21 2138 06/27/21 0524  BP: (!) 119/59 139/63  Pulse: 78 73  Resp:  18 18  Temp: 98.7 F (37.1 C) 97.8 F (36.6 C)  SpO2: 94% 98%   Vitals:   06/26/21 1536 06/26/21 1751 06/26/21 2138 06/27/21 0524  BP: (!) 154/58 (!) 160/63 (!) 119/59 139/63  Pulse: 81 77 78 73  Resp: 18 18 18 18   Temp: 98.9 F (37.2 C) 98.3 F (36.8 C) 98.7 F (37.1 C) 97.8 F (36.6 C)  TempSrc: Oral Oral Oral Axillary  SpO2: 99% 100% 94% 98%  Weight:  Height:        General: Pt is alert, awake, not in acute distress Cardiovascular: RRR, S1/S2 +, no rubs, no gallops Respiratory: CTA bilaterally, no wheezing, no rhonchi, currently on nasal cannula Abdominal: Soft, NT, minimally distended with some bloating, bowel sounds + Extremities: no edema, no cyanosis    The results of significant diagnostics from this hospitalization (including imaging, microbiology, ancillary and laboratory) are listed below for reference.     Microbiology: Recent Results (from the past 240 hour(s))  Resp Panel by RT-PCR (Flu A&B, Covid) Nasopharyngeal Swab     Status: None   Collection Time: 06/23/21  6:20 PM   Specimen: Nasopharyngeal Swab; Nasopharyngeal(NP) swabs in vial transport medium  Result Value Ref Range Status   SARS Coronavirus 2 by RT PCR NEGATIVE NEGATIVE Final    Comment: (NOTE) SARS-CoV-2 target nucleic acids are NOT DETECTED.  The SARS-CoV-2 RNA is generally detectable in upper respiratory specimens during the acute phase of infection. The lowest concentration of SARS-CoV-2 viral copies this assay can detect is 138 copies/mL. A negative result does not preclude SARS-Cov-2 infection and should not be used as the sole basis for treatment or other patient management decisions. A negative result may occur with  improper specimen collection/handling, submission of specimen other than nasopharyngeal swab, presence of viral mutation(s) within the areas targeted by this assay, and inadequate number of viral copies(<138 copies/mL). A negative result must be combined  with clinical observations, patient history, and epidemiological information. The expected result is Negative.  Fact Sheet for Patients:  EntrepreneurPulse.com.au  Fact Sheet for Healthcare Providers:  IncredibleEmployment.be  This test is no t yet approved or cleared by the [place] FDA and  has been authorized for detection and/or diagnosis of SARS-CoV-2 by FDA under an Emergency Use Authorization (EUA). This EUA will remain  in effect (meaning this test can be used) for the duration of the COVID-19 declaration under Section 564(b)(1) of the Act, 21 U.S.C.section 360bbb-3(b)(1), unless the authorization is terminated  or revoked sooner.       Influenza A by PCR NEGATIVE NEGATIVE Final   Influenza B by PCR NEGATIVE NEGATIVE Final    Comment: (NOTE) The Xpert Xpress SARS-CoV-2/FLU/RSV plus assay is intended as an aid in the diagnosis of influenza from Nasopharyngeal swab specimens and should not be used as a sole basis for treatment. Nasal washings and aspirates are unacceptable for Xpert Xpress SARS-CoV-2/FLU/RSV testing.  Fact Sheet for Patients: EntrepreneurPulse.com.au  Fact Sheet for Healthcare Providers: IncredibleEmployment.be  This test is not yet approved or cleared by the [place] FDA and has been authorized for detection and/or diagnosis of SARS-CoV-2 by FDA under an Emergency Use Authorization (EUA). This EUA will remain in effect (meaning this test can be used) for the duration of the COVID-19 declaration under Section 564(b)(1) of the Act, 21 U.S.C. section 360bbb-3(b)(1), unless the authorization is terminated or revoked.  Performed at [facility], [address] [place], [place] [address]   MRSA Next Gen by PCR, Nasal     Status: None   Collection Time: 06/23/21  8:40 PM   Specimen: Nasal Mucosa; Nasal Swab  Result Value Ref Range Status   MRSA by PCR Next Gen NOT DETECTED  NOT DETECTED Final    Comment: (NOTE) The GeneXpert MRSA Assay (FDA approved for NASAL specimens only), is one component of a comprehensive MRSA colonization surveillance program. It is not intended to diagnose MRSA infection nor to guide or monitor treatment for MRSA infections. Test  performance is not FDA approved in patients less than 55 years old. Performed at [facility], [address], [place], [place] [address]      Labs: BNP (last 3 results) Recent Labs    04/30/21 0910 04/30/21 1203 06/23/21 1606  BNP 384.0* 232.0* 761.6*   Basic Metabolic Panel: Recent Labs  Lab 06/23/21 1559 06/24/21 0414 06/25/21 0600 06/26/21 0649  NA 135 136 137 136  K 3.5 4.2 5.1 4.8  CL 97* 98 99 98  CO2 26 30 31 31   GLUCOSE 165* 148* 148* 99  BUN 8 11 14 16   CREATININE 1.11* 1.21* 1.04* 1.05*  CALCIUM 8.8* 8.3* 8.8* 8.7*  MG  --   --  2.6* 2.6*   Liver Function Tests: Recent Labs  Lab 06/23/21 1559  AST 25  ALT 14  ALKPHOS 96  BILITOT 0.5  PROT 7.7  ALBUMIN 4.1   No results for input(s): LIPASE, AMYLASE in the last 168 hours. No results for input(s): AMMONIA in the last 168 hours. CBC: Recent Labs  Lab 06/23/21 1559 06/24/21 0414 06/24/21 0945 06/25/21 0600 06/26/21 0649 06/27/21 0527  WBC 9.6 8.1  --  7.1 6.4 6.9  HGB 5.8* 6.3* 7.5* 7.3* 7.1* 9.5*  HCT 20.9* 21.8* 24.3* 24.5* 24.9* 32.0*  MCV 79.2* 80.1  --  80.1 83.0 81.6  PLT 300 426*  --  372 354 323   Cardiac Enzymes: No results for input(s): CKTOTAL, CKMB, CKMBINDEX, TROPONINI in the last 168 hours. BNP: Invalid input(s): POCBNP CBG: No results for input(s): GLUCAP in the last 168 hours. D-Dimer No results for input(s): DDIMER in the last 72 hours. Hgb A1c No results for input(s): HGBA1C in the last 72 hours. Lipid Profile No results for input(s): CHOL, HDL, LDLCALC, TRIG, CHOLHDL, LDLDIRECT in the last 72 hours. Thyroid function studies No results for input(s): TSH, T4TOTAL, T3FREE, THYROIDAB in  the last 72 hours.  Invalid input(s): FREET3 Anemia work up No results for input(s): VITAMINB12, FOLATE, FERRITIN, TIBC, IRON, RETICCTPCT in the last 72 hours. Urinalysis    Component Value Date/Time   COLORURINE STRAW (A) 06/08/2019 0103   APPEARANCEUR CLEAR 06/08/2019 0103   LABSPEC 1.005 06/08/2019 0103   PHURINE 7.0 06/08/2019 0103   GLUCOSEU NEGATIVE 06/08/2019 0103   HGBUR NEGATIVE 06/08/2019 0103   BILIRUBINUR small (A) 12/13/2020 1559   KETONESUR trace (5) (A) 12/13/2020 1559   KETONESUR NEGATIVE 06/08/2019 0103   PROTEINUR >=300 (A) 12/13/2020 1559   PROTEINUR NEGATIVE 06/08/2019 0103   UROBILINOGEN 2.0 (A) 12/13/2020 1559   UROBILINOGEN 0.2 06/08/2011 1400   NITRITE Positive (A) 12/13/2020 1559   NITRITE NEGATIVE 06/08/2019 0103   LEUKOCYTESUR Large (3+) (A) 12/13/2020 1559   LEUKOCYTESUR NEGATIVE 06/08/2019 0103   Sepsis Labs Invalid input(s): PROCALCITONIN,  WBC,  LACTICIDVEN Microbiology Recent Results (from the past 240 hour(s))  Resp Panel by RT-PCR (Flu A&B, Covid) Nasopharyngeal Swab     Status: None   Collection Time: 06/23/21  6:20 PM   Specimen: Nasopharyngeal Swab; Nasopharyngeal(NP) swabs in vial transport medium  Result Value Ref Range Status   SARS Coronavirus 2 by RT PCR NEGATIVE NEGATIVE Final    Comment: (NOTE) SARS-CoV-2 target nucleic acids are NOT DETECTED.  The SARS-CoV-2 RNA is generally detectable in upper respiratory specimens during the acute phase of infection. The lowest concentration of SARS-CoV-2 viral copies this assay can detect is 138 copies/mL. A negative result does not preclude SARS-Cov-2 infection and should not be used as the sole basis for  treatment or other patient management decisions. A negative result may occur with  improper specimen collection/handling, submission of specimen other than nasopharyngeal swab, presence of viral mutation(s) within the areas targeted by this assay, and inadequate number of  viral copies(<138 copies/mL). A negative result must be combined with clinical observations, patient history, and epidemiological information. The expected result is Negative.  Fact Sheet for Patients:  EntrepreneurPulse.com.au  Fact Sheet for Healthcare Providers:  IncredibleEmployment.be  This test is no t yet approved or cleared by the [place] FDA and  has been authorized for detection and/or diagnosis of SARS-CoV-2 by FDA under an Emergency Use Authorization (EUA). This EUA will remain  in effect (meaning this test can be used) for the duration of the COVID-19 declaration under Section 564(b)(1) of the Act, 21 U.S.C.section 360bbb-3(b)(1), unless the authorization is terminated  or revoked sooner.       Influenza A by PCR NEGATIVE NEGATIVE Final   Influenza B by PCR NEGATIVE NEGATIVE Final    Comment: (NOTE) The Xpert Xpress SARS-CoV-2/FLU/RSV plus assay is intended as an aid in the diagnosis of influenza from Nasopharyngeal swab specimens and should not be used as a sole basis for treatment. Nasal washings and aspirates are unacceptable for Xpert Xpress SARS-CoV-2/FLU/RSV testing.  Fact Sheet for Patients: EntrepreneurPulse.com.au  Fact Sheet for Healthcare Providers: IncredibleEmployment.be  This test is not yet approved or cleared by the [place] FDA and has been authorized for detection and/or diagnosis of SARS-CoV-2 by FDA under an Emergency Use Authorization (EUA). This EUA will remain in effect (meaning this test can be used) for the duration of the COVID-19 declaration under Section 564(b)(1) of the Act, 21 U.S.C. section 360bbb-3(b)(1), unless the authorization is terminated or revoked.  Performed at [facility], [address] [place], [place] [address]   MRSA Next Gen by PCR, Nasal     Status: None   Collection Time: 06/23/21  8:40 PM   Specimen: Nasal Mucosa; Nasal Swab   Result Value Ref Range Status   MRSA by PCR Next Gen NOT DETECTED NOT DETECTED Final    Comment: (NOTE) The GeneXpert MRSA Assay (FDA approved for NASAL specimens only), is one component of a comprehensive MRSA colonization surveillance program. It is not intended to diagnose MRSA infection nor to guide or monitor treatment for MRSA infections. Test performance is not FDA approved in patients less than 40 years old. Performed at [facility], [address] [place], [place] [address]      Time coordinating discharge: 35 minutes  SIGNED:   Rodena Goldmann, DO [facility] 06/27/2021, 9:36 AM  If 7PM-7AM, please contact night-coverage [email or web address]

## 2021-06-27 NOTE — TOC Transition Note (Signed)
Transition of Care [facility]) - CM/SW Discharge Note   Patient Details  Name: Kayla Ryan MRN: [id number] Date of Birth: 10-23-1955  Transition of [facility]) CM/SW Contact:  Natasha Bence, LCSW Phone Number: 06/27/2021, 11:42 AM   Clinical Narrative:    CSW notified of patient's readiness for discharge. CSW notified Lattie Haw with [employer]. Lattie Haw agreeable to provide [facility] services upon discharge. TOC signing off.    Final next level of care: [place] Barriers to Discharge: Barriers Resolved   Patient Goals and CMS Choice Patient states their goals for this hospitalization and ongoing recovery are:: Return home with [facility] CMS Medicare.gov Compare Post Acute Care list provided to:: Patient Choice offered to / list presented to : Patient  Discharge Placement                    Patient and family notified of of transfer: 06/27/21  Discharge Plan and Services                          HH Arranged: PT, Social Work [facility] Agency: [place] Date [facility] Agency Contacted: 06/27/21 Time [place]: [id number] Representative spoke with at [place]: [place] ([place]) Interventions     Readmission Risk Interventions Readmission Risk Prevention Plan 08/18/2020  Transportation Screening Complete  Medication Review Press photographer) Complete  HRI or [employer] Complete  SW Recovery Care/Counseling Consult Complete  Palliative Care Screening Not Applicable  Some recent data might be hidden

## 2021-06-28 ENCOUNTER — Ambulatory Visit: Payer: Medicare Other | Admitting: Primary Care

## 2021-06-28 ENCOUNTER — Telehealth: Payer: Self-pay | Admitting: Gastroenterology

## 2021-06-28 DIAGNOSIS — T189XXA Foreign body of alimentary tract, part unspecified, initial encounter: Secondary | ICD-10-CM

## 2021-06-28 DIAGNOSIS — D508 Other iron deficiency anemias: Secondary | ICD-10-CM

## 2021-06-28 NOTE — Telephone Encounter (Signed)
[facility] clinical pool: discussed patient with Dr. Laural Golden. Please arrange agile capsule study for patient. She will need to arrange transport.

## 2021-06-29 NOTE — Telephone Encounter (Signed)
Tried to call pt, LMOVM for return call

## 2021-06-29 NOTE — Addendum Note (Signed)
Addended by: Hassan Rowan on: 06/29/2021 04:07 PM   Modules accepted: Orders

## 2021-06-29 NOTE — Telephone Encounter (Signed)
Spoke to pt, agile capsule scheduled for 07/13/21 at 7:30am. Pt to arrive at 7:00am. Orders entered. Abd x-ray ordered entered. Instructions mailed.

## 2021-07-07 ENCOUNTER — Encounter (HOSPITAL_COMMUNITY): Payer: Self-pay | Admitting: Radiology

## 2021-07-07 DIAGNOSIS — M797 Fibromyalgia: Secondary | ICD-10-CM | POA: Diagnosis not present

## 2021-07-07 DIAGNOSIS — E7800 Pure hypercholesterolemia, unspecified: Secondary | ICD-10-CM | POA: Diagnosis not present

## 2021-07-07 DIAGNOSIS — J449 Chronic obstructive pulmonary disease, unspecified: Secondary | ICD-10-CM | POA: Diagnosis not present

## 2021-07-13 ENCOUNTER — Telehealth: Payer: Self-pay

## 2021-07-13 ENCOUNTER — Telehealth: Payer: Self-pay | Admitting: Internal Medicine

## 2021-07-13 NOTE — Telephone Encounter (Signed)
Pt called office, said she wasn't a no show for agile. She had called hospital yesterday but the on call doctor didn't call her back. She later spoke to a supervisor at the hospital and told them she wouldn't be able to do agile today d/t sore throat. Her voice was hoarse. Wants to reschedule agile. Her PCP done thyroid labs but wouldn't do thyroid US as they didn't have access to CT that showed thyroid nodule. PCP requested pt to have CT sent to them. CT cervical spine faxed to PCP via HIM release per pt request.  Pt is aware we will call her back to reschedule agile when endo scheduler advises of available dates. Message sent to endo scheduler.

## 2021-07-13 NOTE — Telephone Encounter (Signed)
Melanie at [facility] informed office pt was no show for Agile capsule this morning.  Tried to call pt, went straight to voicemail. LMOVM for return call.

## 2021-07-13 NOTE — Telephone Encounter (Signed)
PATIENT COULD NOT DO HER TEST THIS MORNING BECAUSE HER THROAT HURT

## 2021-07-13 NOTE — Telephone Encounter (Signed)
See prior note

## 2021-07-14 ENCOUNTER — Telehealth: Payer: Self-pay | Admitting: Internal Medicine

## 2021-07-14 NOTE — Telephone Encounter (Signed)
Phone went straight to vm. Lmom for pt to return my call.

## 2021-07-14 NOTE — Telephone Encounter (Signed)
Spoke with pt and instructed her to go to the ER to be evaluated given her having nausea/vomiting and being disoriented. Pt verbalized understanding.

## 2021-07-14 NOTE — Telephone Encounter (Signed)
[phone]  please call patient asap, she said she is bleeding

## 2021-07-14 NOTE — Telephone Encounter (Signed)
Given her reported of vomiting and feeling disoriented, and prior history of profound anemia, she should consider going to the ED to be checked for significant anemia and dehydration. It would expedite her work up. Tammy also mentioned patient complaints of sore throat and hoarseness but no fever, cough, congestion.

## 2021-07-14 NOTE — Telephone Encounter (Signed)
Pt states that last night she had a lot of gas and some diarrhea. Pt stated that she noticed red blood dripping in the toilet. This morning at 6 am she passed a nickel sized blood clot. Since passing the blood clot she has not experienced anymore bleeding. Pt states that she doesn't have diarrhea but that her stools are loose. Pt does have some nausea/vomiting and feels a little disoriented.

## 2021-07-14 NOTE — Telephone Encounter (Signed)
Tried to call pt to reschedule agile, LMOVM for return call.

## 2021-07-15 NOTE — Telephone Encounter (Signed)
Spoke to pt, Agile rescheduled to 07/22/21 at 7:30am. Arrive at 7:00am. Endo scheduler informed. Gave instructions to pt on phone and mailed.

## 2021-07-16 ENCOUNTER — Ambulatory Visit: Payer: Medicare Other | Admitting: Nurse Practitioner

## 2021-07-22 ENCOUNTER — Encounter (HOSPITAL_COMMUNITY)
Admission: RE | Disposition: A | Discharge status: Home / Self Care | Payer: Self-pay | Source: Home / Self Care | Attending: Internal Medicine

## 2021-07-22 ENCOUNTER — Ambulatory Visit (HOSPITAL_COMMUNITY)
Admission: RE | Admit: 2021-07-22 | Discharge: 2021-07-22 | Disposition: A | Discharge status: Home / Self Care | Payer: Medicare Other | Attending: Internal Medicine | Admitting: Internal Medicine

## 2021-07-22 DIAGNOSIS — D649 Anemia, unspecified: Secondary | ICD-10-CM | POA: Insufficient documentation

## 2021-07-22 HISTORY — PX: AGILE CAPSULE: SHX5420

## 2021-07-22 SURGERY — AGILE CAPSULE

## 2021-07-23 ENCOUNTER — Ambulatory Visit (HOSPITAL_COMMUNITY): Payer: Medicare Other

## 2021-07-23 DIAGNOSIS — D649 Anemia, unspecified: Secondary | ICD-10-CM | POA: Diagnosis not present

## 2021-07-23 DIAGNOSIS — G8929 Other chronic pain: Secondary | ICD-10-CM | POA: Diagnosis not present

## 2021-07-23 DIAGNOSIS — Z79899 Other long term (current) drug therapy: Secondary | ICD-10-CM | POA: Diagnosis not present

## 2021-07-23 DIAGNOSIS — N183 Chronic kidney disease, stage 3 unspecified: Secondary | ICD-10-CM | POA: Diagnosis not present

## 2021-07-23 DIAGNOSIS — I10 Essential (primary) hypertension: Secondary | ICD-10-CM | POA: Diagnosis not present

## 2021-07-23 DIAGNOSIS — M549 Dorsalgia, unspecified: Secondary | ICD-10-CM | POA: Diagnosis not present

## 2021-07-23 DIAGNOSIS — K5900 Constipation, unspecified: Secondary | ICD-10-CM | POA: Diagnosis not present

## 2021-07-23 DIAGNOSIS — J449 Chronic obstructive pulmonary disease, unspecified: Secondary | ICD-10-CM | POA: Diagnosis not present

## 2021-07-23 DIAGNOSIS — E782 Mixed hyperlipidemia: Secondary | ICD-10-CM | POA: Diagnosis not present

## 2021-07-23 DIAGNOSIS — M5136 Other intervertebral disc degeneration, lumbar region: Secondary | ICD-10-CM | POA: Diagnosis not present

## 2021-07-26 ENCOUNTER — Encounter (HOSPITAL_COMMUNITY): Payer: Self-pay | Admitting: Internal Medicine

## 2021-07-26 DIAGNOSIS — D500 Iron deficiency anemia secondary to blood loss (chronic): Secondary | ICD-10-CM | POA: Diagnosis not present

## 2021-07-26 DIAGNOSIS — J449 Chronic obstructive pulmonary disease, unspecified: Secondary | ICD-10-CM | POA: Diagnosis not present

## 2021-07-26 DIAGNOSIS — R140 Abdominal distension (gaseous): Secondary | ICD-10-CM | POA: Diagnosis not present

## 2021-07-26 DIAGNOSIS — K5900 Constipation, unspecified: Secondary | ICD-10-CM | POA: Diagnosis not present

## 2021-07-26 DIAGNOSIS — J45909 Unspecified asthma, uncomplicated: Secondary | ICD-10-CM | POA: Diagnosis not present

## 2021-07-26 DIAGNOSIS — I4891 Unspecified atrial fibrillation: Secondary | ICD-10-CM | POA: Diagnosis not present

## 2021-07-26 DIAGNOSIS — J9611 Chronic respiratory failure with hypoxia: Secondary | ICD-10-CM | POA: Diagnosis not present

## 2021-07-26 DIAGNOSIS — K921 Melena: Secondary | ICD-10-CM | POA: Diagnosis not present

## 2021-07-26 DIAGNOSIS — I10 Essential (primary) hypertension: Secondary | ICD-10-CM | POA: Diagnosis not present

## 2021-07-26 DIAGNOSIS — K5090 Crohn's disease, unspecified, without complications: Secondary | ICD-10-CM | POA: Diagnosis not present

## 2021-07-28 DIAGNOSIS — Z79899 Other long term (current) drug therapy: Secondary | ICD-10-CM | POA: Diagnosis not present

## 2021-07-29 ENCOUNTER — Ambulatory Visit: Payer: Medicare Other | Admitting: Nurse Practitioner

## 2021-08-05 ENCOUNTER — Ambulatory Visit: Payer: Medicare Other | Admitting: Nurse Practitioner

## 2021-08-05 ENCOUNTER — Encounter: Payer: Self-pay | Admitting: *Deleted

## 2021-08-20 ENCOUNTER — Telehealth: Payer: Self-pay | Admitting: Internal Medicine

## 2021-08-20 NOTE — Telephone Encounter (Signed)
I did not

## 2021-08-20 NOTE — Telephone Encounter (Signed)
Did anyone try calling this patient? She thinks it was a reminder call about a test she's having done on 08/24/2021. [phone]

## 2021-08-23 ENCOUNTER — Telehealth: Payer: Self-pay

## 2021-08-23 ENCOUNTER — Ambulatory Visit: Payer: Medicare Other | Admitting: Nurse Practitioner

## 2021-08-23 NOTE — Telephone Encounter (Signed)
Spoke to pt, Givens capsule study rescheduled to 09/07/21 at 7:30am. Endo scheduler informed. New instructions mailed. Informed Neil Crouch PA since she had planned to read.

## 2021-08-23 NOTE — Telephone Encounter (Signed)
Pt called office, she is scheduled for Givens capsule study tomorrow. She ate a pimento cheese sandwich after 12:00pm today. She called hospital and was told she would need to reschedule.  Available dates received from endo scheduler. Tried to call pt to reschedule [place], LMOVM for return call.

## 2021-08-24 DIAGNOSIS — N183 Chronic kidney disease, stage 3 unspecified: Secondary | ICD-10-CM | POA: Diagnosis not present

## 2021-08-24 DIAGNOSIS — Z0000 Encounter for general adult medical examination without abnormal findings: Secondary | ICD-10-CM | POA: Diagnosis not present

## 2021-08-24 DIAGNOSIS — M797 Fibromyalgia: Secondary | ICD-10-CM | POA: Diagnosis not present

## 2021-08-24 DIAGNOSIS — J449 Chronic obstructive pulmonary disease, unspecified: Secondary | ICD-10-CM | POA: Diagnosis not present

## 2021-08-24 DIAGNOSIS — Z79899 Other long term (current) drug therapy: Secondary | ICD-10-CM | POA: Diagnosis not present

## 2021-08-24 DIAGNOSIS — M5440 Lumbago with sciatica, unspecified side: Secondary | ICD-10-CM | POA: Diagnosis not present

## 2021-08-24 DIAGNOSIS — R69 Illness, unspecified: Secondary | ICD-10-CM | POA: Diagnosis not present

## 2021-08-26 DIAGNOSIS — Z79899 Other long term (current) drug therapy: Secondary | ICD-10-CM | POA: Diagnosis not present

## 2021-08-26 DIAGNOSIS — J449 Chronic obstructive pulmonary disease, unspecified: Secondary | ICD-10-CM | POA: Diagnosis not present

## 2021-09-06 ENCOUNTER — Telehealth: Payer: Self-pay

## 2021-09-06 NOTE — Telephone Encounter (Signed)
Pt called office and told Tammy she is scheduled for Givens capsule study tomorrow and she ate a red [place] ice about an hour ago. She didn't realize she couldn't have anything red.  Will call pt back to reschedule Givens. Informed endo scheduler.

## 2021-09-06 NOTE — Telephone Encounter (Signed)
Called pt, Givens capsule study rescheduled to 09/22/21 at 7:30am. New instructions mailed. Endo scheduler informed. Neil Crouch PA also informed since she will be reading.

## 2021-09-13 DIAGNOSIS — D500 Iron deficiency anemia secondary to blood loss (chronic): Secondary | ICD-10-CM | POA: Diagnosis not present

## 2021-09-15 ENCOUNTER — Encounter: Payer: Self-pay | Admitting: Emergency Medicine

## 2021-09-15 ENCOUNTER — Other Ambulatory Visit: Payer: Self-pay

## 2021-09-15 ENCOUNTER — Ambulatory Visit (INDEPENDENT_AMBULATORY_CARE_PROVIDER_SITE_OTHER): Payer: Medicare Other | Admitting: Emergency Medicine

## 2021-09-15 DIAGNOSIS — J9611 Chronic respiratory failure with hypoxia: Secondary | ICD-10-CM

## 2021-09-15 DIAGNOSIS — J449 Chronic obstructive pulmonary disease, unspecified: Secondary | ICD-10-CM | POA: Diagnosis not present

## 2021-09-15 DIAGNOSIS — Z23 Encounter for immunization: Secondary | ICD-10-CM

## 2021-09-15 DIAGNOSIS — D649 Anemia, unspecified: Secondary | ICD-10-CM

## 2021-09-15 DIAGNOSIS — J309 Allergic rhinitis, unspecified: Secondary | ICD-10-CM

## 2021-09-15 NOTE — Patient Instructions (Addendum)
Please continue Stiolto 2 puffs once daily. Keep your albuterol available to use either 2 puffs or 1 nebulizer treatment when you needed for shortness of breath, chest tightness, wheezing. Continue your oxygen at 2-3 L/min.  We will perform a walking oxygen test today to see if you can qualify for a pulsed oxygen system, portable oxygen concentrator. Continue Singulair and Zyrtec as you are taking them. Keep Mucinex available to use if needed We will check blood work today (CBC).  Follow with Dr. Gala Romney as planned Follow with Dr Lamonte Sakai in 6 months or sooner if you have any problems

## 2021-09-15 NOTE — Assessment & Plan Note (Signed)
Continue your oxygen at 2-3 L/min.  We will perform a walking oxygen test today to see if you can qualify for a pulsed oxygen system, portable oxygen concentrator.

## 2021-09-15 NOTE — Addendum Note (Signed)
Addended by: Gavin Potters R on: 09/15/2021 04:26 PM   Modules accepted: Orders

## 2021-09-15 NOTE — Assessment & Plan Note (Signed)
She has a capsule endoscopy planned for this month.  Her hemoglobin had rebounded following transfusion at her recent hospitalization.  We will check a CBC today

## 2021-09-15 NOTE — Assessment & Plan Note (Signed)
Continue Singulair and Zyrtec as you are taking them.

## 2021-09-15 NOTE — Addendum Note (Signed)
Addended by: Gavin Potters R on: 09/15/2021 04:57 PM   Modules accepted: Orders

## 2021-09-15 NOTE — Progress Notes (Signed)
Subjective:    Patient ID: Kayla Ryan, female    DOB: 09-Oct-1955, 66 y.o.   MRN: [id number]  HPI 66 year old former smoker (25 pack years) with a history of inflammatory myopathy, fibromyalgia, hyperlipidemia, Crohn's disease and IBS, chronic anemia.  She carries a history of COPD/asthma that was made at [facility] w PFT's in 2007.  Has been on advair before, not currently. She has DuoNeb to use prn, tries to avoid due to jitteriness. She is using albuterol HFA about 4x a day - believes it probably helps her.  She was admitted at the end of May for symptomatic anemia and lower extremity edema.  She received blood products with some subjective improvement.   Referred today for evaluation of shortness of breath. Longstanding but worse over the last 2 years. Happens with just walking a few feet.  She has allergies and nasal gtt, also frequent GERD.  She is currently on Singulair, Zyrtec, saline spray.  Has Flonase available to use as needed.  She has albuterol and uses 3-4x a day.    ROV 09/15/21 --follow-up visit for 66 year old woman with history tobacco (25 pack years).  I have followed her for COPD with asthmatic features, chronic rhinitis, GERD, cough.  Past medical history also significant for inflammatory myopathy, fibromyalgia, hyperlipidemia, Crohn's disease with IBS and chronic anemia.  She has severe obstruction on pulmonary function testing.  Our last visit was 05/11/2021 by telephone.  We have been managing her on Stiolto, albuterol as needed, oxygen at 3 L/min.  Also Singulair, Zyrtec, Mucinex prn.  We have plan to repeat a CT chest to confirm resolution of bibasilar infiltrates that were found 04/2021 when she was treated for a pneumonia and anemia.  I do not see that this test has been done. Today she reports that she is SOB with any exertion, using 3L/min. She has albuterol HFA, and nebs. Uses about 3x a week. She is going to have a camera endoscopy 2/15 to evaluate anemia. She is  interested in a pulsed O2 system, POC.    Review of Systems As per HPI  Past Medical History:  Diagnosis Date   Anemia    Arthritis    Asthma    Chest pain    Chronic bronchitis    Congestive heart failure (CHF) (HCC)    COPD (chronic obstructive pulmonary disease) (HCC)    Crohn disease (HCC)    Emphysema    Emphysema of lung (HCC)    Fibromyalgia    Herpes    Hyperlipidemia    IBS (irritable bowel syndrome)    Kidney failure    Migraines    Muscular dystrophy ([place])    Neck pain    Plantar fasciitis    Polymyositis ([place])    Scoliosis      Family History  Problem Relation Age of Onset   Lung cancer Mother    COPD Father    Lung cancer Father    Lymphoma Father    Anxiety disorder Sister    Depression Brother      Social History   Socioeconomic History   Marital status: Widowed    Spouse name: Not on file   Number of children: Not on file   Years of education: Not on file   Highest education level: Not on file  Occupational History   Not on file  Tobacco Use   Smoking status: Former    Packs/day: 1.00    Years: 49.00    Pack years: 49.00  Types: Cigarettes    Start date: 08/08/1966    Quit date: 08/31/2005    Years since quitting: 16.0   Smokeless tobacco: Never  Vaping Use   Vaping Use: Never used  Substance and Sexual Activity   Alcohol use: No   Drug use: No    Comment: used marijuana in teens   Sexual activity: Not Currently    Birth control/protection: Surgical  Other Topics Concern   Not on file  Social History Narrative   Not on file   Social Determinants of Health   Financial Resource Strain: Not on file  Food Insecurity: Not on file  Transportation Needs: Not on file  Physical Activity: Not on file  Stress: Not on file  Social Connections: Not on file  Intimate Partner Violence: Not on file    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  From [place], lived in [place], [place], [place].   Allergies  Allergen Reactions   Sulfa  Antibiotics Shortness Of Breath, Swelling and Rash   Dilaudid [Hydromorphone Hcl]     Makes me crazy   Iron     Throat starts closing up, tongue swells, severe headaches and backpain   Metronidazole Diarrhea and Nausea And Vomiting   Prednisone Other (See Comments)    angry angry    Cephalexin Diarrhea and Nausea And Vomiting   Methotrexate Derivatives Swelling and Rash   Morphine And Related Anxiety    "exreme mood swings"     Outpatient Medications Prior to Visit  Medication Sig Dispense Refill   acyclovir (ZOVIRAX) 400 MG tablet Take 400 mg by mouth 2 (two) times daily.     albuterol (PROVENTIL) (2.5 MG/3ML) 0.083% nebulizer solution Take 2.5 mg by nebulization every 6 (six) hours as needed for wheezing or shortness of breath.     cetirizine (ZYRTEC) 10 MG tablet Take 10 mg by mouth daily.     diltiazem (CARDIZEM CD) 120 MG 24 hr capsule Take 1 capsule (120 mg total) by mouth daily. 30 capsule 2   doxepin (SINEQUAN) 10 MG capsule Take 10 mg by mouth at bedtime.     DULoxetine (CYMBALTA) 60 MG capsule Take 1 capsule (60 mg total) by mouth 2 (two) times daily. 60 capsule 3   folic acid (FOLVITE) 1 MG tablet Take 1 tablet (1 mg total) by mouth daily. 30 tablet 3   gabapentin (NEURONTIN) 800 MG tablet Take 800 mg by mouth 3 (three) times daily.     gemfibrozil (LOPID) 600 MG tablet Take 600 mg by mouth 2 (two) times daily before a meal.     ipratropium (ATROVENT HFA) 17 MCG/ACT inhaler Inhale 2 puffs into the lungs every 6 (six) hours.     LORazepam (ATIVAN) 0.5 MG tablet Take 1 tablet (0.5 mg total) by mouth 3 (three) times daily as needed for anxiety. 10 tablet 0   montelukast (SINGULAIR) 10 MG tablet Take 10 mg by mouth daily.     NITROSTAT 0.4 MG SL tablet Place 0.4 mg under the tongue every 15 (fifteen) minutes as needed for chest pain.      omeprazole (PRILOSEC) 40 MG capsule Take 40 mg by mouth 2 (two) times daily.     ondansetron (ZOFRAN-ODT) 4 MG disintegrating tablet Take 2  mg by mouth every 6 (six) hours as needed.     oxyCODONE (OXY IR/ROXICODONE) 5 MG immediate release tablet Take 1 tablet (5 mg total) by mouth every 4 (four) hours as needed for moderate pain or severe  pain. 10 tablet 0   polyethylene glycol (MIRALAX / GLYCOLAX) 17 g packet Take 8.5 g by mouth at bedtime. 14 each 0   simethicone (GAS-X) 80 MG chewable tablet Chew 1 tablet (80 mg total) by mouth every 6 (six) hours as needed for flatulence. 30 tablet 0   Tiotropium Bromide-Olodaterol (STIOLTO RESPIMAT) 2.5-2.5 MCG/ACT AERS Inhale 2 puffs into the lungs daily. 4 g 5   XTAMPZA ER 18 MG C12A Take 1 capsule by mouth 2 (two) times daily. 10 capsule 0   pantoprazole (PROTONIX) 40 MG tablet Take 1 tablet (40 mg total) by mouth 2 (two) times daily. 60 tablet 1   No facility-administered medications prior to visit.        Objective:   Physical Exam Vitals:   09/15/21 1525  BP: (!) 142/78  Pulse: 92  Temp: 98.3 F (36.8 C)  TempSrc: Oral  SpO2: 98%  Weight: 138 lb 12.8 oz (63 kg)  Height: 5' 8"  (1.727 m)   Gen: Pleasant, well-nourished, in no distress,  normal affect  ENT: No lesions,  mouth clear,  oropharynx clear, no postnasal drip  Neck: No JVD, no stridor  Lungs: No use of accessory muscles, no crackles or wheezing on normal respiration, B rhonchi on forced exp  Cardiovascular: RRR, heart sounds normal, no murmur or gallops, no peripheral edema  Musculoskeletal: No deformities, no cyanosis or clubbing  Neuro: alert, awake, non focal  Skin: Warm, no lesions or rash     Assessment & Plan:  Chronic obstructive pulmonary disease/Emphysema Please continue Stiolto 2 puffs once daily. Keep your albuterol available to use either 2 puffs or 1 nebulizer treatment when you needed for shortness of breath, chest tightness, wheezing. Keep Mucinex available to use if needed Follow with Dr Lamonte Sakai in 6 months or sooner if you have any problems  Chronic respiratory failure with hypoxia  ([place]) Continue your oxygen at 2-3 L/min.  We will perform a walking oxygen test today to see if you can qualify for a pulsed oxygen system, portable oxygen concentrator.  Allergic rhinitis Continue Singulair and Zyrtec as you are taking them.  Acute on chronic anemia She has a capsule endoscopy planned for this month.  Her hemoglobin had rebounded following transfusion at her recent hospitalization.  We will check a CBC today   Baltazar Apo, MD, PhD 09/15/2021, 3:50 PM [facility] [phone] or if no answer [phone]

## 2021-09-15 NOTE — Assessment & Plan Note (Signed)
Please continue Stiolto 2 puffs once daily. Keep your albuterol available to use either 2 puffs or 1 nebulizer treatment when you needed for shortness of breath, chest tightness, wheezing. Keep Mucinex available to use if needed Follow with Dr Lamonte Sakai in 6 months or sooner if you have any problems

## 2021-09-16 NOTE — Addendum Note (Signed)
Addended by: Gavin Potters R on: 09/16/2021 08:21 AM   Modules accepted: Orders

## 2021-09-17 ENCOUNTER — Telehealth: Payer: Self-pay | Admitting: Emergency Medicine

## 2021-09-17 NOTE — Telephone Encounter (Signed)
Spoke with pt and insured her POC order was placed by RN on Thursday. DME is [employer]. Pt stated understanding. Nothing further needed at this time.

## 2021-09-22 ENCOUNTER — Encounter (HOSPITAL_COMMUNITY)
Admission: RE | Disposition: A | Discharge status: Home / Self Care | Payer: Self-pay | Source: Ambulatory Visit | Attending: Internal Medicine

## 2021-09-22 ENCOUNTER — Ambulatory Visit (HOSPITAL_COMMUNITY)
Admission: RE | Admit: 2021-09-22 | Discharge: 2021-09-22 | Disposition: A | Discharge status: Home / Self Care | Payer: Medicare Other | Source: Ambulatory Visit | Attending: Internal Medicine | Admitting: Internal Medicine

## 2021-09-22 DIAGNOSIS — M5136 Other intervertebral disc degeneration, lumbar region: Secondary | ICD-10-CM | POA: Diagnosis not present

## 2021-09-22 DIAGNOSIS — K5520 Angiodysplasia of colon without hemorrhage: Secondary | ICD-10-CM

## 2021-09-22 DIAGNOSIS — R300 Dysuria: Secondary | ICD-10-CM | POA: Diagnosis not present

## 2021-09-22 DIAGNOSIS — D500 Iron deficiency anemia secondary to blood loss (chronic): Secondary | ICD-10-CM | POA: Diagnosis not present

## 2021-09-22 DIAGNOSIS — N183 Chronic kidney disease, stage 3 unspecified: Secondary | ICD-10-CM | POA: Diagnosis not present

## 2021-09-22 DIAGNOSIS — Z20822 Contact with and (suspected) exposure to covid-19: Secondary | ICD-10-CM | POA: Diagnosis not present

## 2021-09-22 DIAGNOSIS — D539 Nutritional anemia, unspecified: Secondary | ICD-10-CM | POA: Diagnosis not present

## 2021-09-22 DIAGNOSIS — Z79899 Other long term (current) drug therapy: Secondary | ICD-10-CM | POA: Diagnosis not present

## 2021-09-22 DIAGNOSIS — E7800 Pure hypercholesterolemia, unspecified: Secondary | ICD-10-CM | POA: Diagnosis not present

## 2021-09-22 DIAGNOSIS — D509 Iron deficiency anemia, unspecified: Secondary | ICD-10-CM | POA: Insufficient documentation

## 2021-09-22 DIAGNOSIS — G8929 Other chronic pain: Secondary | ICD-10-CM | POA: Diagnosis not present

## 2021-09-22 DIAGNOSIS — R5383 Other fatigue: Secondary | ICD-10-CM | POA: Diagnosis not present

## 2021-09-22 DIAGNOSIS — M549 Dorsalgia, unspecified: Secondary | ICD-10-CM | POA: Diagnosis not present

## 2021-09-22 DIAGNOSIS — M129 Arthropathy, unspecified: Secondary | ICD-10-CM | POA: Diagnosis not present

## 2021-09-22 DIAGNOSIS — E559 Vitamin D deficiency, unspecified: Secondary | ICD-10-CM | POA: Diagnosis not present

## 2021-09-22 HISTORY — PX: GIVENS CAPSULE STUDY: SHX5432

## 2021-09-22 SURGERY — IMAGING PROCEDURE, GI TRACT, INTRALUMINAL, VIA CAPSULE

## 2021-09-23 ENCOUNTER — Encounter (HOSPITAL_COMMUNITY): Payer: Self-pay | Admitting: Internal Medicine

## 2021-09-24 ENCOUNTER — Telehealth: Payer: Self-pay | Admitting: Emergency Medicine

## 2021-09-24 ENCOUNTER — Telehealth: Payer: Self-pay | Admitting: Gastroenterology

## 2021-09-24 DIAGNOSIS — Z79899 Other long term (current) drug therapy: Secondary | ICD-10-CM | POA: Diagnosis not present

## 2021-09-24 DIAGNOSIS — K5520 Angiodysplasia of colon without hemorrhage: Secondary | ICD-10-CM

## 2021-09-24 NOTE — Telephone Encounter (Signed)
Please let pt know of her small bowel capsule results.   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 No active Crohn's disease, small bowel mass or ulcers. No active bleeding noted during study.  Limit ASA, take only if medically necessary. Avoid NSAIDs. Update CBC, iron/tibc/ferritin. Return office visit for follow up.   Clayborn Heron who ordered the test.

## 2021-09-24 NOTE — Op Note (Addendum)
° °  Small Bowel Kayla Ryan Capsule Study Procedure date: 09/22/2021  Referring Provider: Roseanne Kaufman, NP PCP:  Dr. Guadlupe Spanish, MD  Indication for procedure: Profound IDA requiring blood transfusion.  Previously followed by hematology for IV iron but required "special mix" due to allergic reaction. Reports only tolerating small doses of IV iron at a time. No recent IV iron. She Kayla Ryan personal history of Crohn's in her 10s but has not been on maintenance medication. Colonoscopy in 2006 previously reported to show incomplete colonoscopy to the mid transverse colon, mucosal changes consistent with Crohn's colitis in remission. I do not have the report. Celiac serologies negative.  EGD and colonoscopy in September 2022 unremarkable.  Prior history of coffee colored stool in 06/2021.  Aspirin 81 mg daily but denied other NSAIDs.     Patient data:  Wt: 84.8 kg  Findings: Patient swallowed capsule without difficulty.  Capsule study complete, with small bowel passage time of 5 hours 6 minutes.  Capsule reached the cecum at 5 hours 28 minutes and 15 seconds.  She had a few gastric erosions, nonbleeding.  Small nonbleeding AVM noted at 21 minutes 19 seconds appeared to be in the duodenal bulb.  Two nonbleeding small bowel AVMs noted at 26 minutes 13 seconds and 30 minutes 23 seconds.  Small bowel erosion at 1 hour and 36 minutes no active bleeding noted. No small bowel masses, ulcers noted.  First Gastric image: 36 seconds First Duodenal image: 21 minutes 21 seconds First Ileo-Cecal Valve image: 5 hours 28 minutes 13 seconds First Cecal image: 5 hours 28 minutes 15 seconds Gastric Passage time: 20 minutes  Small Bowel Passage time: 5 hours 6 minutes               Summary & Recommendations: IDA requiring blood transfusion, previous IV iron.  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  No active Crohn's disease, small bowel masses or ulceration noted.  No active bleeding  noted during study.  Limit ASA, take only if medically necessary. Avoid NSAIDs. Update CBC, iron/tibc/ferritin. Return office visit for follow up.   Laureen Ochs. Bernarda Caffey [facility] [phone] 2/17/20233:55 PM

## 2021-09-26 DIAGNOSIS — J449 Chronic obstructive pulmonary disease, unspecified: Secondary | ICD-10-CM | POA: Diagnosis not present

## 2021-09-27 ENCOUNTER — Encounter: Payer: Self-pay | Admitting: Internal Medicine

## 2021-09-27 ENCOUNTER — Telehealth: Payer: Self-pay | Admitting: Emergency Medicine

## 2021-09-27 NOTE — Telephone Encounter (Signed)
See encounter from 02/17.

## 2021-09-27 NOTE — Telephone Encounter (Signed)
Please let her know that I do not know of any Pulmonary reason she cannot be on Farxiga if her other providers believe it will be helpful to her

## 2021-09-27 NOTE — Telephone Encounter (Signed)
Called and spoke with patient. She verbalized understanding.   Nothing further needed at time of call.  

## 2021-09-27 NOTE — Telephone Encounter (Signed)
Noted. No further recommendations. Please have follow-up arranged.

## 2021-09-27 NOTE — Telephone Encounter (Signed)
Pt was made aware and verbalized understanding. Routing to the front to schedule pt f/u appt. Pt states that she just had same labs done with pcp and will have them send the results to [place].

## 2021-09-27 NOTE — Telephone Encounter (Signed)
Called patient and she is wanting to know if it is safe for her to take the medication Farxiga with her lungs the way they are.   Dr Lamonte Sakai please advise

## 2021-10-04 DIAGNOSIS — E041 Nontoxic single thyroid nodule: Secondary | ICD-10-CM | POA: Diagnosis not present

## 2021-10-07 DIAGNOSIS — G7249 Other inflammatory and immune myopathies, not elsewhere classified: Secondary | ICD-10-CM | POA: Diagnosis not present

## 2021-10-13 NOTE — Telephone Encounter (Signed)
I sent another request to get any recent labs.  ?

## 2021-10-13 NOTE — Telephone Encounter (Signed)
I have not received any labs. Kayla Ryan did you receive any? ?

## 2021-10-18 NOTE — Telephone Encounter (Signed)
I do not have any labs either. WIll be on lookout.  ?

## 2021-10-19 DIAGNOSIS — M797 Fibromyalgia: Secondary | ICD-10-CM | POA: Diagnosis not present

## 2021-10-19 DIAGNOSIS — G894 Chronic pain syndrome: Secondary | ICD-10-CM | POA: Diagnosis not present

## 2021-10-19 DIAGNOSIS — M47816 Spondylosis without myelopathy or radiculopathy, lumbar region: Secondary | ICD-10-CM | POA: Diagnosis not present

## 2021-10-19 DIAGNOSIS — Z79891 Long term (current) use of opiate analgesic: Secondary | ICD-10-CM | POA: Diagnosis not present

## 2021-10-24 DIAGNOSIS — J449 Chronic obstructive pulmonary disease, unspecified: Secondary | ICD-10-CM | POA: Diagnosis not present

## 2021-11-04 DIAGNOSIS — D500 Iron deficiency anemia secondary to blood loss (chronic): Secondary | ICD-10-CM | POA: Diagnosis not present

## 2021-11-05 DIAGNOSIS — Z79899 Other long term (current) drug therapy: Secondary | ICD-10-CM | POA: Diagnosis not present

## 2021-11-05 DIAGNOSIS — N183 Chronic kidney disease, stage 3 unspecified: Secondary | ICD-10-CM | POA: Diagnosis not present

## 2021-11-05 DIAGNOSIS — G8929 Other chronic pain: Secondary | ICD-10-CM | POA: Diagnosis not present

## 2021-11-05 DIAGNOSIS — N1831 Chronic kidney disease, stage 3a: Secondary | ICD-10-CM | POA: Diagnosis not present

## 2021-11-05 DIAGNOSIS — M549 Dorsalgia, unspecified: Secondary | ICD-10-CM | POA: Diagnosis not present

## 2021-11-05 DIAGNOSIS — Z87891 Personal history of nicotine dependence: Secondary | ICD-10-CM | POA: Diagnosis not present

## 2021-11-05 DIAGNOSIS — M5136 Other intervertebral disc degeneration, lumbar region: Secondary | ICD-10-CM | POA: Diagnosis not present

## 2021-11-10 DIAGNOSIS — D352 Benign neoplasm of pituitary gland: Secondary | ICD-10-CM | POA: Insufficient documentation

## 2021-11-10 DIAGNOSIS — E041 Nontoxic single thyroid nodule: Secondary | ICD-10-CM | POA: Insufficient documentation

## 2021-11-13 NOTE — Telephone Encounter (Signed)
Please let pt know that we never received labs, requested twice. I can see labs from 11/2021 but that was only thyroid labs. ? ?Would recommend cbc, iron/tibc/ferritin to be completed.  ?Keep ov 12/2021. ?

## 2021-11-15 NOTE — Telephone Encounter (Signed)
Spoke with Morey Hummingbird with medical records at [facility] and had most recent labs faxed over. Labs are in Leslie's box to review when back in the office.  ?

## 2021-11-18 NOTE — Telephone Encounter (Signed)
Pt was made aware and verbalized understanding.  ?

## 2021-11-18 NOTE — Telephone Encounter (Signed)
Labs received from PCP dated 09/22/2021: Iron 36 low, TIBC 632 high.  Folate 12.39, B12 450, ferritin 7.1, hemoglobin 9.5, hematocrit 31 ? ?Labs from PCP dated November 05, 2021: White blood cell count 9700, hemoglobin 9.7, hematocrit 31.6, MCV 83, platelets 503,000, total bilirubin 0.455, alkaline phosphatase 82, AST 28, ALT 28, BUN 19, creatinine 1.1 ? ?Hgb stable. ?Keep ov next month as planned. ?Call with any signs of black or bloody stools, or worsening fatigue/shortness of breath or other signs of worsening anemia. ?

## 2021-12-07 ENCOUNTER — Telehealth: Payer: Self-pay | Admitting: Emergency Medicine

## 2021-12-07 DIAGNOSIS — J9611 Chronic respiratory failure with hypoxia: Secondary | ICD-10-CM

## 2021-12-07 DIAGNOSIS — J449 Chronic obstructive pulmonary disease, unspecified: Secondary | ICD-10-CM

## 2021-12-08 NOTE — Telephone Encounter (Signed)
Called and spoke with patient who states that she needs an order and the walk that she did at last OV sent over to [employer] so that she can try to get one. She states that she can't get it from [facility] and she said she understands that she may have to pay some out of pocket for it but she needs it to leave for doctors appts. Order has been placed. Nothing further needed at this time. ?

## 2021-12-21 IMAGING — DX DG ABDOMEN 1V
2 series · 2 of 2 positions shown · non-contrast
Comparison: June 26, 2021

CLINICAL DATA: Constipation for 8 days. Looking for capsule from
endoscopy. History of Crohn's disease and irritable bowel.

EXAM:
ABDOMEN - 1 VIEW

[abdomen kub (1 of 2)]
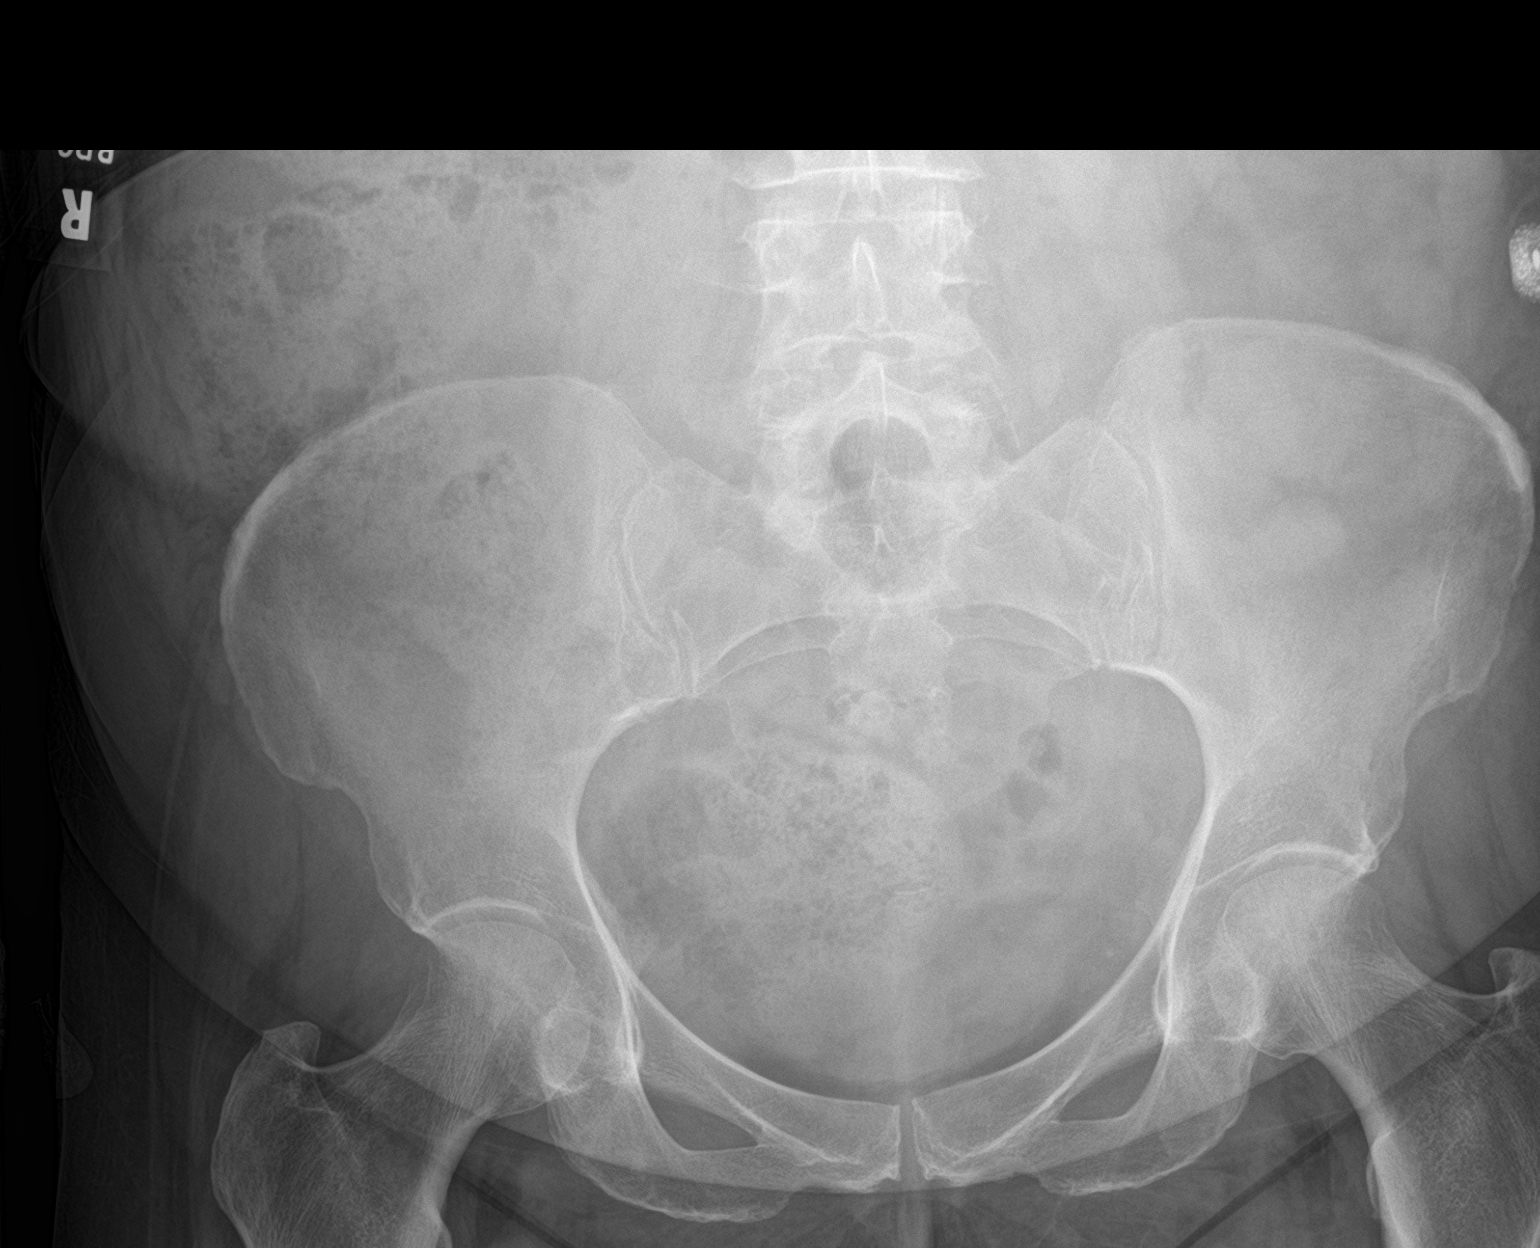

[abdomen kub (2 of 2)]
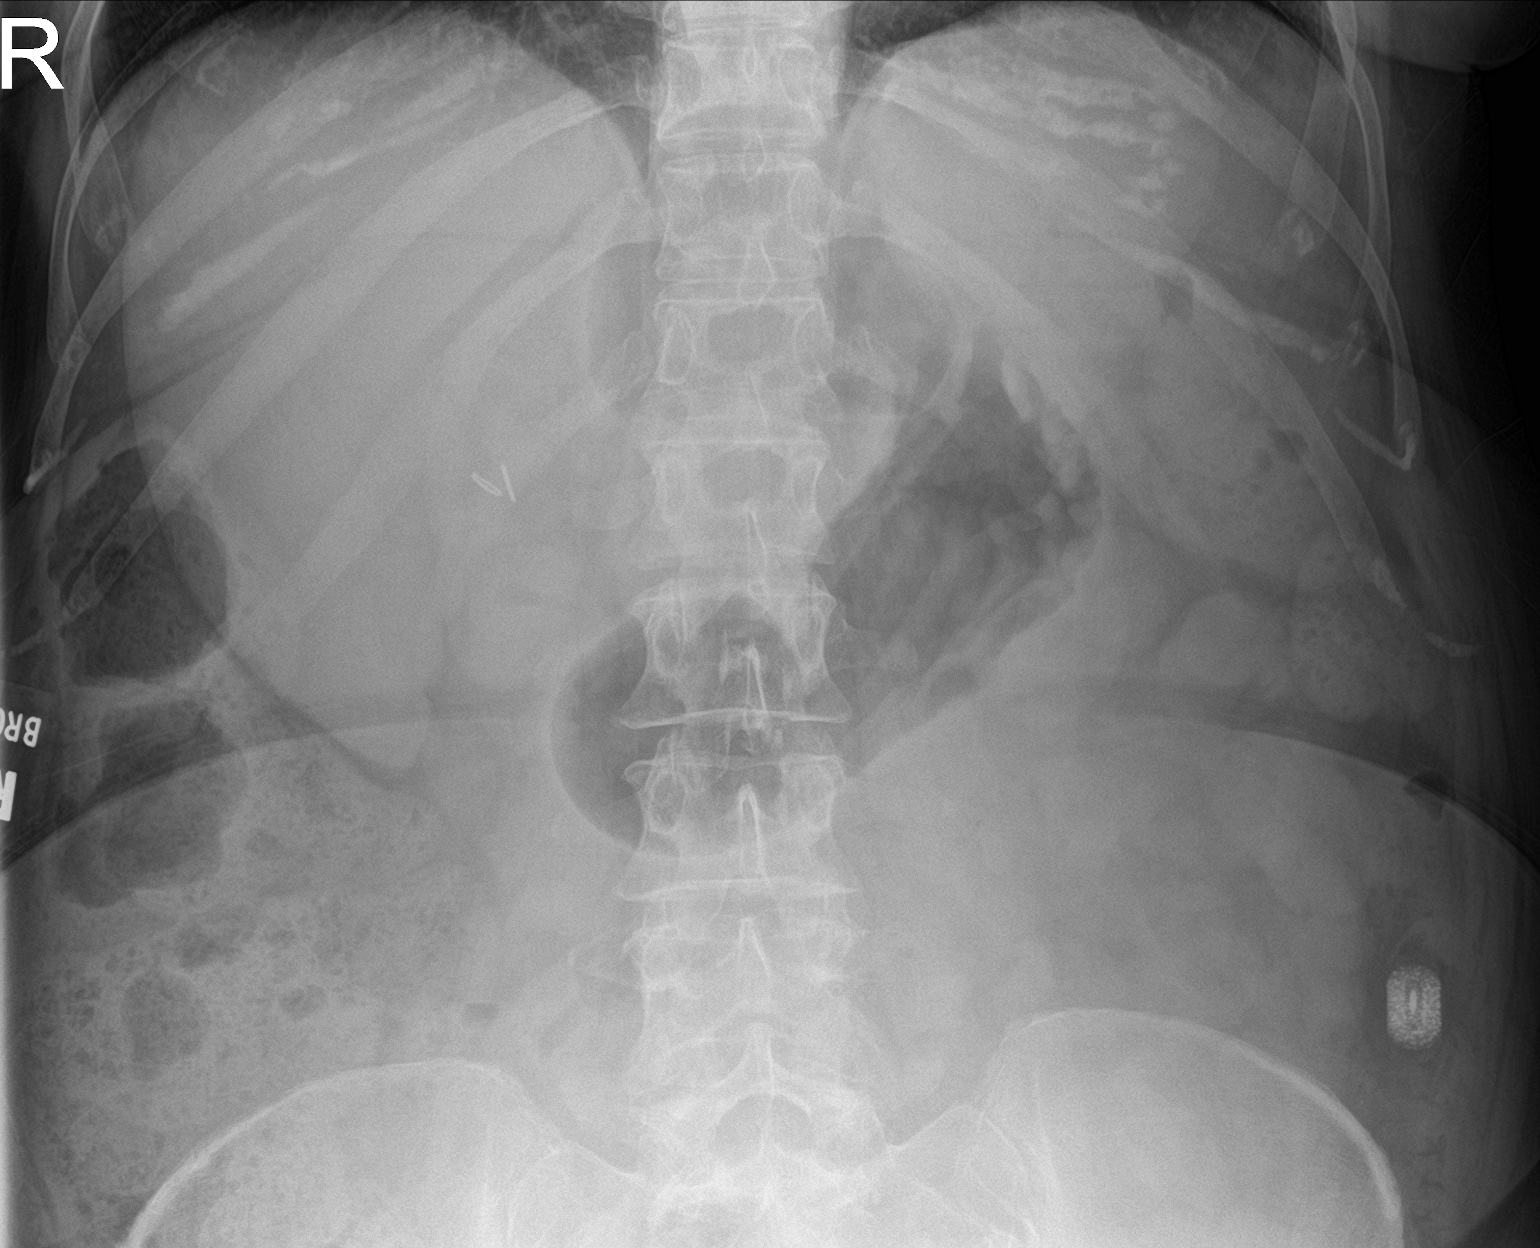

[2 of 2 positions shown; findings below may reference images not displayed]

FINDINGS: The capsule is in the mid descending colon. Moderate fecal loading
in the colon. No other acute abnormalities.
IMPRESSION: The capsule is in the mid descending colon. Moderate fecal loading
throughout the colon. No other abnormalities.

## 2022-01-05 ENCOUNTER — Ambulatory Visit: Payer: Medicare Other | Admitting: Gastroenterology

## 2022-01-12 NOTE — Progress Notes (Deleted)
GI Office Note    Referring Provider: Guadlupe Spanish, MD Primary Care Physician:  Guadlupe Spanish, MD Primary GI: Dr. Gala Romney  Date:  01/12/2022  ID:  Kayla Ryan, DOB July 01, 1956, MRN [id number]   Chief Complaint   No chief complaint on file.    History of Present Illness  Kayla Ryan is a 66 y.o. female presenting today with a history of COPD, congestive heart failure, IBS, muscular dystrophy, scoliosis, fibromyalgia, asthma, and anemia with multiple hospital admissions presenting today for follow-up***  She has never been seen in our office, however she has been followed by [place] during prior hospitalizations for anemia.  Previously had primary GI with [facility].  She has had full work-up with EGD, colonoscopy, and capsule study as outlined below.  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  No active bleeding noted during study.  Prior labs 09/22/2021: Iron 36, TIBC 632, folate 12.39, B12 450, ferritin 7.1, Hgb 9.5  Most recent labs from PCP on 11/05/2021: Hgb 9.7, HCT 31.6, MCV 83, platelets 503, T. bili 0.45, alk phos 82, AST 28, ALT 28, BUN 19, creatinine 1.1    Today:    Past Medical History:  Diagnosis Date   Anemia    Arthritis    Asthma    Chest pain    Chronic bronchitis    Congestive heart failure (CHF) (HCC)    COPD (chronic obstructive pulmonary disease) ([place])    Crohn disease ([place])    Emphysema    Emphysema of lung (HCC)    Fibromyalgia    Herpes    Hyperlipidemia    IBS (irritable bowel syndrome)    Kidney failure    Migraines    Muscular dystrophy ([place])    Neck pain    Plantar fasciitis    Polymyositis ([place])    Scoliosis      Past Surgical History:  Procedure Laterality Date   ABDOMINAL HYSTERECTOMY     AGILE CAPSULE N/A 06/24/2021   Procedure: AGILE CAPSULE;  Surgeon: Eloise Harman, DO;  [facility];  Service: Endoscopy;  Laterality: N/A;   AGILE CAPSULE N/A 07/22/2021   Procedure: AGILE CAPSULE;  Surgeon: Daneil Dolin, MD;  [facility];  Service: Endoscopy;  Laterality: N/A;  7:30am   bone spur     CHOLECYSTECTOMY     COLONOSCOPY WITH PROPOFOL N/A 04/30/2021   Procedure: COLONOSCOPY WITH PROPOFOL;  Surgeon: Daneil Dolin, MD;  [facility];  Service: Endoscopy;  Laterality: N/A;   ESOPHAGEAL DILATION N/A 04/30/2021   Procedure: ESOPHAGEAL DILATION;  Surgeon: Daneil Dolin, MD;  [facility];  Service: Endoscopy;  Laterality: N/A;   ESOPHAGOGASTRODUODENOSCOPY (EGD) WITH PROPOFOL N/A 04/30/2021   Procedure: ESOPHAGOGASTRODUODENOSCOPY (EGD) WITH PROPOFOL;  Surgeon: Daneil Dolin, MD;  [facility];  Service: Endoscopy;  Laterality: N/A;   GIVENS CAPSULE STUDY N/A 09/22/2021   Procedure: GIVENS CAPSULE STUDY;  Surgeon: Daneil Dolin, MD;  [facility];  Service: Endoscopy;  Laterality: N/A;  7:30am   HERNIA REPAIR     LEFT HEART CATHETERIZATION WITH CORONARY ANGIOGRAM N/A 11/07/2013   Procedure: LEFT HEART CATHETERIZATION WITH CORONARY ANGIOGRAM;  Surgeon: Lorretta Harp,  MD;  Location: [place] CATH LAB;  Service: Cardiovascular;  Laterality: N/A;   MASTECTOMY PARTIAL / LUMPECTOMY     OOPHORECTOMY     ROTATOR CUFF REPAIR      Current Outpatient Medications  Medication Sig Dispense Refill   acyclovir (ZOVIRAX) 400 MG tablet Take 400 mg by mouth 2 (two) times daily.     albuterol (PROVENTIL) (2.5 MG/3ML) 0.083% nebulizer solution Take 2.5 mg by nebulization every 6 (six) hours as needed for wheezing or shortness of breath.     cetirizine (ZYRTEC) 10 MG tablet Take 10 mg by mouth daily.     diltiazem (CARDIZEM CD) 120 MG 24 hr capsule  Take 1 capsule (120 mg total) by mouth daily. 30 capsule 2   doxepin (SINEQUAN) 10 MG capsule Take 10 mg by mouth at bedtime.     DULoxetine (CYMBALTA) 60 MG capsule Take 1 capsule (60 mg total) by mouth 2 (two) times daily. 60 capsule 3   folic acid (FOLVITE) 1 MG tablet Take 1 tablet (1 mg total) by mouth daily. 30 tablet 3   gabapentin (NEURONTIN) 800 MG tablet Take 800 mg by mouth 3 (three) times daily.     gemfibrozil (LOPID) 600 MG tablet Take 600 mg by mouth 2 (two) times daily before a meal.     ipratropium (ATROVENT HFA) 17 MCG/ACT inhaler Inhale 2 puffs into the lungs every 6 (six) hours.     LORazepam (ATIVAN) 0.5 MG tablet Take 1 tablet (0.5 mg total) by mouth 3 (three) times daily as needed for anxiety. 10 tablet 0   montelukast (SINGULAIR) 10 MG tablet Take 10 mg by mouth daily.     NITROSTAT 0.4 MG SL tablet Place 0.4 mg under the tongue every 15 (fifteen) minutes as needed for chest pain.      omeprazole (PRILOSEC) 40 MG capsule Take 40 mg by mouth 2 (two) times daily.     ondansetron (ZOFRAN-ODT) 4 MG disintegrating tablet Take 2 mg by mouth every 6 (six) hours as needed.     oxyCODONE (OXY IR/ROXICODONE) 5 MG immediate release tablet Take 1 tablet (5 mg total) by mouth every 4 (four) hours as needed for moderate pain or severe pain. 10 tablet 0   pantoprazole (PROTONIX) 40 MG tablet Take 1 tablet (40 mg total) by mouth 2 (two) times daily. 60 tablet 1   polyethylene glycol (MIRALAX / GLYCOLAX) 17 g packet Take 8.5 g by mouth at bedtime. 14 each 0   simethicone (GAS-X) 80 MG chewable tablet Chew 1 tablet (80 mg total) by mouth every 6 (six) hours as needed for flatulence. 30 tablet 0   Tiotropium Bromide-Olodaterol (STIOLTO RESPIMAT) 2.5-2.5 MCG/ACT AERS Inhale 2 puffs into the lungs daily. 4 g 5   XTAMPZA ER 18 MG C12A Take 1 capsule by mouth 2 (two) times daily. 10 capsule 0   No current facility-administered medications for this visit.    Allergies as of 01/13/2022 - Review  Complete 09/22/2021  Allergen Reaction Noted   Sulfa antibiotics Shortness Of Breath, Swelling, and Rash 06/08/2011   Dilaudid [hydromorphone hcl]  10/31/2013   Iron  08/31/2020   Metronidazole Diarrhea and Nausea And Vomiting 03/03/2015   Prednisone Other (See Comments) 03/03/2015   Cephalexin Diarrhea and Nausea And Vomiting 03/03/2015   Methotrexate derivatives Swelling and Rash 06/08/2011   Morphine and related Anxiety 11/07/2013    Family History  Problem Relation Age of Onset   Lung cancer Mother    COPD Father  Lung cancer Father    Lymphoma Father    Anxiety disorder Sister    Depression Brother     Social History   Socioeconomic History   Marital status: Widowed    Spouse name: Not on file   Number of children: Not on file   Years of education: Not on file   Highest education level: Not on file  Occupational History   Not on file  Tobacco Use   Smoking status: Former    Packs/day: 1.00    Years: 49.00    Pack years: 49.00    Types: Cigarettes    Start date: 08/08/1966    Quit date: 08/31/2005    Years since quitting: 16.3   Smokeless tobacco: Never  Vaping Use   Vaping Use: Never used  Substance and Sexual Activity   Alcohol use: No   Drug use: No    Comment: used marijuana in teens   Sexual activity: Not Currently    Birth control/protection: Surgical  Other Topics Concern   Not on file  Social History Narrative   Not on file   Social Determinants of Health   Financial Resource Strain: Not on file  Food Insecurity: Not on file  Transportation Needs: Not on file  Physical Activity: Not on file  Stress: Not on file  Social Connections: Not on file     Review of Systems   Gen: Denies fever, chills, anorexia. Denies fatigue, weakness, weight loss.  CV: Denies chest pain, palpitations, syncope, peripheral edema, and claudication. Resp: Denies dyspnea at rest, cough, wheezing, coughing up blood, and pleurisy. GI: Denies vomiting blood,  jaundice, and fecal incontinence.   Denies dysphagia or odynophagia. Derm: Denies rash, itching, dry skin Psych: Denies depression, anxiety, memory loss, confusion. No homicidal or suicidal ideation.  Heme: Denies bruising, bleeding, and enlarged lymph nodes.   Physical Exam   There were no vitals taken for this visit.  General:   Alert and oriented. No distress noted. Pleasant and cooperative.  Head:  Normocephalic and atraumatic. Eyes:  Conjuctiva clear without scleral icterus. Mouth:  Oral mucosa pink and moist. Good dentition. No lesions. Lungs:  Clear to auscultation bilaterally. No wheezes, rales, or rhonchi. No distress.  Heart:  S1, S2 present without murmurs appreciated.  Abdomen:  +BS, soft, non-tender and non-distended. No rebound or guarding. No HSM or masses noted. Rectal: *** Msk:  Symmetrical without gross deformities. Normal posture. Extremities:  Without edema. Neurologic:  Alert and  oriented x4 Psych:  Alert and cooperative. Normal mood and affect.   Assessment  EARLY ORD is a 66 y.o. 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 Negative colonoscopy and EGD.    PLAN   ***** Continue to avoid all NSAIDs Limit ASA use Continue pantoprazole 40 mg BID.     Venetia Night, MSN, FNP-BC, AGACNP-BC [facility]

## 2022-01-13 ENCOUNTER — Ambulatory Visit: Payer: Medicare Other | Admitting: Gastroenterology

## 2022-01-24 ENCOUNTER — Telehealth: Payer: Self-pay | Admitting: Gastroenterology

## 2022-01-24 ENCOUNTER — Encounter: Payer: Self-pay | Admitting: Gastroenterology

## 2022-01-24 ENCOUNTER — Telehealth (INDEPENDENT_AMBULATORY_CARE_PROVIDER_SITE_OTHER): Payer: Medicare Other | Admitting: Gastroenterology

## 2022-01-24 ENCOUNTER — Other Ambulatory Visit: Payer: Self-pay

## 2022-01-24 ENCOUNTER — Inpatient Hospital Stay: Payer: Medicare Other | Admitting: Gastroenterology

## 2022-01-24 VITALS — BP 126/65 | Ht 68.0 in | Wt 175.6 lb

## 2022-01-24 DIAGNOSIS — R197 Diarrhea, unspecified: Secondary | ICD-10-CM

## 2022-01-24 DIAGNOSIS — K529 Noninfective gastroenteritis and colitis, unspecified: Secondary | ICD-10-CM

## 2022-01-24 DIAGNOSIS — D509 Iron deficiency anemia, unspecified: Secondary | ICD-10-CM | POA: Diagnosis not present

## 2022-01-24 MED ORDER — DICYCLOMINE HCL 10 MG PO CAPS: 10.0000 mg | ORAL_CAPSULE | Freq: Three times a day (TID) | ORAL | 3 refills | Status: DC

## 2022-01-24 MED ORDER — HYDROCORTISONE (PERIANAL) 2.5 % EX CREA: 1.0000 | TOPICAL_CREAM | Freq: Two times a day (BID) | CUTANEOUS | 1 refills | Status: DC

## 2022-01-24 NOTE — Telephone Encounter (Signed)
Dena, please let patient know that Hematology is requesting recent labs in chart prior to seeing patient in consultation.  Please have her complete CBC and iron studies (iron, TIBC, ferritin, etc). Thanks!  FYI Minday

## 2022-01-24 NOTE — Telephone Encounter (Signed)
Referral sent 

## 2022-01-24 NOTE — Progress Notes (Signed)
Primary Care Physician:  Guadlupe Spanish, MD  Primary GI: Dr. Abbey Chatters  Patient Location: Home   Provider Location: [facility] office   Reason for Visit: Follow-up, diarrhea   Persons present on the virtual encounter, with roles: Patient and NP   Total time (minutes) spent on medical discussion: 10 minutes   Due to COVID-19, visit was conducted using virtual method.  Visit was requested by patient.  Virtual Visit via MyChart Video Note Due to COVID-19, visit is conducted virtually and was requested by patient.   I connected with Kayla Ryan on 01/24/22 at 11:00 AM EDT by video and verified that I am speaking with the correct person using two identifiers.   I discussed the limitations, risks, security and privacy concerns of performing an evaluation and management service by video and the availability of in person appointments. I also discussed with the patient that there may be a patient responsible charge related to this service. The patient expressed understanding and agreed to proceed.  Chief Complaint  Patient presents with   Follow-up    Diarrhea. Pt stated over having issues with light blood from her hemorrhoids     History of Present Illness: 66 year old female with history of COPD, dermatomyositis, GERD, questionable Crohn's disease in the past but recent EGD/colonoscopy without Crohns, IDA requiring transfusions in the past and most recently Nov 2022 while inpatient when colonoscopy/EGD copmleted, presenting today in follow-up.    She had a capsule study after discharge with few erosions in stomach and 3 AVMs. No signs of IBD. We do not have recent labs.   She notes history of chronic diarrhea intermittently as well. States 5 loose stools today. Typicall will have several loose stools daily. Associated abdominal cramping., no malodorous odor. Has taken dicyclomine in the past with good results. No recent antibiotics. No sick contacts.    Labs received from PCP  dated 09/22/2021: Iron 36 low, TIBC 632 high.  Folate 12.39, B12 450, ferritin 7.1, hemoglobin 9.5, hematocrit 31  Labs from PCP dated November 05, 2021: White blood cell count 9700, hemoglobin 9.7, hematocrit 31.6, MCV 83, platelets 503,000, total bilirubin 0.455, alkaline phosphatase 82, AST 28, ALT 28, BUN 19, creatinine 1.1   Previous pertinent work up:   Colonoscopy September 2022: -Diverticulosis in the sigmoid colon -Redundant and elongated colon   EGD September 2022: - Normal esophagus. Status post esophageal dilation - Normal stomach. - Normal duodenal bulb, second portion of the duodenum and third portion of the duodenum.  Past Medical History:  Diagnosis Date   Anemia    Arthritis    Asthma    Chest pain    Chronic bronchitis    Congestive heart failure (CHF) (HCC)    COPD (chronic obstructive pulmonary disease) ([place])    Crohn disease ([place])    Emphysema    Emphysema of lung (HCC)    Fibromyalgia    Herpes    Hyperlipidemia    IBS (irritable bowel syndrome)    Kidney failure    Migraines    Muscular dystrophy ([place])    Neck pain    Plantar fasciitis    Polymyositis ([place])    Scoliosis      Past Surgical History:  Procedure Laterality Date   ABDOMINAL HYSTERECTOMY     AGILE CAPSULE N/A 06/24/2021   Procedure: AGILE CAPSULE;  Surgeon: Eloise Harman, DO;  [facility];  Service: Endoscopy;  Laterality: N/A;   AGILE CAPSULE N/A 07/22/2021  Procedure: AGILE CAPSULE;  Surgeon: Daneil Dolin, MD;  [facility];  Service: Endoscopy;  Laterality: N/A;  7:30am   bone spur     CHOLECYSTECTOMY     COLONOSCOPY WITH PROPOFOL N/A 04/30/2021   Procedure: COLONOSCOPY WITH PROPOFOL;  Surgeon: Daneil Dolin, MD;  [facility];  Service: Endoscopy;  Laterality: N/A;   ESOPHAGEAL DILATION N/A 04/30/2021   Procedure: ESOPHAGEAL DILATION;  Surgeon: Daneil Dolin, MD;  [facility];  Service: Endoscopy;  Laterality: N/A;    ESOPHAGOGASTRODUODENOSCOPY (EGD) WITH PROPOFOL N/A 04/30/2021   Procedure: ESOPHAGOGASTRODUODENOSCOPY (EGD) WITH PROPOFOL;  Surgeon: Daneil Dolin, MD;  [facility];  Service: Endoscopy;  Laterality: N/A;   GIVENS CAPSULE STUDY N/A 09/22/2021   Procedure: GIVENS CAPSULE STUDY;  Surgeon: Daneil Dolin, MD;  [facility];  Service: Endoscopy;  Laterality: N/A;  7:30am   HERNIA REPAIR     LEFT HEART CATHETERIZATION WITH CORONARY ANGIOGRAM N/A 11/07/2013   Procedure: LEFT HEART CATHETERIZATION WITH CORONARY ANGIOGRAM;  Surgeon: Lorretta Harp, MD;  Location: [facility] CATH LAB;  Service: Cardiovascular;  Laterality: N/A;   MASTECTOMY PARTIAL / LUMPECTOMY     OOPHORECTOMY     ROTATOR CUFF REPAIR       Current Meds  Medication Sig   acyclovir (ZOVIRAX) 400 MG tablet Take 400 mg by mouth 2 (two) times daily.   cetirizine (ZYRTEC) 10 MG tablet Take 10 mg by mouth daily.   doxepin (SINEQUAN) 10 MG capsule Take 10 mg by mouth at bedtime.   DULoxetine (CYMBALTA) 60 MG capsule Take 1 capsule (60 mg total) by mouth 2 (two) times daily.   gabapentin (NEURONTIN) 800 MG tablet Take 800 mg by mouth 3 (three) times daily.   gemfibrozil (LOPID) 600 MG tablet Take 600 mg by mouth 2 (two) times daily before a meal.   ipratropium (ATROVENT HFA) 17 MCG/ACT inhaler Inhale 2 puffs into the lungs every 6 (six) hours.   LORazepam (ATIVAN) 0.5 MG tablet Take 1 tablet (0.5 mg total) by mouth 3 (three) times daily as needed for anxiety.   montelukast (SINGULAIR) 10 MG tablet Take 10 mg by mouth daily.   NITROSTAT 0.4 MG SL tablet Place 0.4 mg under the tongue every 15 (fifteen) minutes as needed for chest pain.    omeprazole (PRILOSEC) 40 MG capsule Take 40 mg by mouth 2 (two) times daily.   ondansetron (ZOFRAN-ODT) 4 MG disintegrating tablet Take 2 mg by mouth every 6 (six) hours as needed.   simethicone (GAS-X) 80 MG chewable tablet Chew 1 tablet (80 mg total) by mouth every 6 (six) hours as needed  for flatulence.     Family History  Problem Relation Age of Onset   Lung cancer Mother    COPD Father    Lung cancer Father    Lymphoma Father    Anxiety disorder Sister    Depression Brother     Social History   Socioeconomic History   Marital status: Widowed    Spouse name: Not on file   Number of children: Not on file   Years of education: Not on file   Highest education level: Not on file  Occupational History   Not on file  Tobacco Use   Smoking status: Former    Packs/day: 1.00    Years: 49.00    Total pack years: 49.00    Types: Cigarettes    Start date: 08/08/1966    Quit  date: 08/31/2005    Years since quitting: 16.4   Smokeless tobacco: Never  Vaping Use   Vaping Use: Never used  Substance and Sexual Activity   Alcohol use: No   Drug use: No    Comment: used marijuana in teens   Sexual activity: Not Currently    Birth control/protection: Surgical  Other Topics Concern   Not on file  Social History Narrative   Not on file   Social Determinants of Health   Financial Resource Strain: Not on file  Food Insecurity: Not on file  Transportation Needs: Not on file  Physical Activity: Not on file  Stress: Not on file  Social Connections: Not on file       Review of Systems: Gen: Denies fever, chills, anorexia. Denies fatigue, weakness, weight loss.  CV: Denies chest pain, palpitations, syncope, peripheral edema, and claudication. Resp: Denies dyspnea at rest, cough, wheezing, coughing up blood, and pleurisy. GI: see HPI Derm: Denies rash, itching, dry skin Psych: Denies depression, anxiety, memory loss, confusion. No homicidal or suicidal ideation.  Heme: Denies bruising, bleeding, and enlarged lymph nodes.  Observations/Objective: No distress. Unable to perform physical exam due to video encounter.   Assessment and Plan: 66 year old female with history of COPD, dermatomyositis, GERD, questionable Crohn's disease in the past but recent  EGD/colonoscopy without Crohns, IDA requiring transfusions in the past and most recently Nov 2022 while inpatient when colonoscopy/EGD completed, presenting today in follow-up.    IDA: colonoscopy/EGD unrevealing. Capsule study as outpatient with few erosions in stomach and 3 non-bleeding AVMs. Suspect chronic blood loss due to small bowel etiology. We will refer to Hematology. She states she is unable to tolerate oral iron or IV iron due to allergic reactions.  History of chronic intermittent diarrhea: will start dicyclomine, as this has worked well for her in the past. Discussed potential side effects to be aware of. Low-volume hematochezia likely secondary to hemorrhoids. Anusol sent to pharmacy.   Return in 3 months for close follow-up.   Follow Up Instructions:    I discussed the assessment and treatment plan with the patient. The patient was provided an opportunity to ask questions and all were answered. The patient agreed with the plan and demonstrated an understanding of the instructions.   The patient was advised to call back or seek an in-person evaluation if the symptoms worsen or if the condition fails to improve as anticipated.  I provided 10 minutes of face-to-face time during this MyChart Video encounter.  Annitta Needs, PhD, ANP-BC [facility]

## 2022-01-24 NOTE — Telephone Encounter (Signed)
Erline Levine, please arrange office follow-up in 3 months. Thanks!  [facility] clinical pool: please refer to Hematology due to chronic IDA.

## 2022-01-24 NOTE — Telephone Encounter (Signed)
Labs in the system and pt notified. She stated she is going tomorrow to have them done.

## 2022-01-24 NOTE — Addendum Note (Signed)
Addended by: Cheron Every on: 01/24/2022 11:36 AM   Modules accepted: Orders

## 2022-01-24 NOTE — Patient Instructions (Signed)
I have sent in Anusol cream to use per rectum twice a day for 7 days or as needed .  For looser stool: you can take dicyclomine before meals and at bedtime (no more than 4 per day). This can cause constipation, dry mouth, dizziness, confusion. Stop if this occurs. I feel you will do well on this as you have taken it in the past.   If no improvement in your symptoms, please call!  We will see you in 3 months!  It was a pleasure to see you today. I want to create trusting relationships with patients to provide genuine, compassionate, and quality care. I value your feedback. If you receive a survey regarding your visit,  I greatly appreciate you taking time to fill this out.   Annitta Needs, PhD, ANP-BC [facility]

## 2022-01-29 LAB — CBC WITH DIFFERENTIAL/PLATELET
Absolute Monocytes: 484 cells/uL (ref 200–950)
Basophils Absolute: 78 cells/uL (ref 0–200)
Basophils Relative: 1 %
Eosinophils Absolute: 335 cells/uL (ref 15–500)
Eosinophils Relative: 4.3 %
HCT: 28.1 % — ABNORMAL LOW (ref 35.0–45.0)
Hemoglobin: 8.2 g/dL — ABNORMAL LOW (ref 11.7–15.5)
Lymphs Abs: 2309 cells/uL (ref 850–3900)
MCH: 22.6 pg — ABNORMAL LOW (ref 27.0–33.0)
MCHC: 29.2 g/dL — ABNORMAL LOW (ref 32.0–36.0)
MCV: 77.4 fL — ABNORMAL LOW (ref 80.0–100.0)
MPV: 10.9 fL (ref 7.5–12.5)
Monocytes Relative: 6.2 %
Neutro Abs: 4594 cells/uL (ref 1500–7800)
Neutrophils Relative %: 58.9 %
Platelets: 453 10*3/uL — ABNORMAL HIGH (ref 140–400)
RBC: 3.63 10*6/uL — ABNORMAL LOW (ref 3.80–5.10)
RDW: 15.3 % — ABNORMAL HIGH (ref 11.0–15.0)
Total Lymphocyte: 29.6 %
WBC: 7.8 10*3/uL (ref 3.8–10.8)

## 2022-01-29 LAB — IRON,TIBC AND FERRITIN PANEL
%SAT: 4 % (calc) — ABNORMAL LOW (ref 16–45)
Ferritin: 5 ng/mL — ABNORMAL LOW (ref 16–288)
Iron: 25 ug/dL — ABNORMAL LOW (ref 45–160)
TIBC: 585 mcg/dL (calc) — ABNORMAL HIGH (ref 250–450)

## 2022-02-03 LAB — HEMOGLOBIN A1C: Hemoglobin A1C: 10.9

## 2022-02-09 ENCOUNTER — Telehealth: Payer: Self-pay | Admitting: Emergency Medicine

## 2022-02-09 NOTE — Telephone Encounter (Signed)
Fax received from Dr. Diona Browner with [facility]ner [facility] to perform a Removal bilateral mandibular lingual tori, extraction two teeth on patient.  Patient needs surgery clearance. Surgery is pending. Patient was seen on 09/15/21. Office protocol is a risk assessment can be sent to surgeon if patient has been seen in 60 days or less.   Sending to Dr. Lamonte Sakai for risk assessment or recommendations if patient needs to be seen in office prior to surgical procedure.

## 2022-02-15 NOTE — Telephone Encounter (Signed)
I reviewed her most recent note from February.  I think she would need to be seen by APP to ensure that she is stable, on stable medications before proceeding

## 2022-02-15 NOTE — Telephone Encounter (Signed)
Called and spoke with pt letting her know the info per RB and she verbalized understanding.  Pt is scheduled for a surgical clearance visit with RB 7/13. Routing to surgical clearance pool as an FYI.

## 2022-02-17 ENCOUNTER — Ambulatory Visit: Payer: Medicare Other | Admitting: Emergency Medicine

## 2022-02-18 ENCOUNTER — Ambulatory Visit: Payer: Medicare Other | Admitting: Emergency Medicine

## 2022-02-24 ENCOUNTER — Ambulatory Visit: Payer: Medicare Other | Admitting: Nurse Practitioner

## 2022-02-25 ENCOUNTER — Encounter (HOSPITAL_COMMUNITY): Payer: Self-pay | Admitting: Hematology

## 2022-02-25 ENCOUNTER — Emergency Department (HOSPITAL_COMMUNITY)
Admission: EM | Admit: 2022-02-25 | Discharge: 2022-02-25 | Disposition: A | Discharge status: Home / Self Care | Payer: Medicare Other | Attending: Emergency Medicine | Admitting: Emergency Medicine

## 2022-02-25 ENCOUNTER — Encounter (HOSPITAL_COMMUNITY): Payer: Self-pay | Admitting: Emergency Medicine

## 2022-02-25 ENCOUNTER — Inpatient Hospital Stay (HOSPITAL_COMMUNITY): Payer: Medicare Other | Attending: Hematology | Admitting: Hematology

## 2022-02-25 VITALS — BP 150/59 | HR 99 | Temp 98.5°F | Resp 19 | Ht 68.0 in | Wt 176.0 lb

## 2022-02-25 DIAGNOSIS — Z9981 Dependence on supplemental oxygen: Secondary | ICD-10-CM | POA: Insufficient documentation

## 2022-02-25 DIAGNOSIS — I10 Essential (primary) hypertension: Secondary | ICD-10-CM | POA: Diagnosis not present

## 2022-02-25 DIAGNOSIS — E1165 Type 2 diabetes mellitus with hyperglycemia: Secondary | ICD-10-CM | POA: Insufficient documentation

## 2022-02-25 DIAGNOSIS — Z87891 Personal history of nicotine dependence: Secondary | ICD-10-CM | POA: Insufficient documentation

## 2022-02-25 DIAGNOSIS — Z808 Family history of malignant neoplasm of other organs or systems: Secondary | ICD-10-CM | POA: Insufficient documentation

## 2022-02-25 DIAGNOSIS — N189 Chronic kidney disease, unspecified: Secondary | ICD-10-CM | POA: Insufficient documentation

## 2022-02-25 DIAGNOSIS — Z189 Retained foreign body fragments, unspecified material: Secondary | ICD-10-CM

## 2022-02-25 DIAGNOSIS — D500 Iron deficiency anemia secondary to blood loss (chronic): Secondary | ICD-10-CM | POA: Insufficient documentation

## 2022-02-25 DIAGNOSIS — R739 Hyperglycemia, unspecified: Secondary | ICD-10-CM | POA: Diagnosis present

## 2022-02-25 DIAGNOSIS — Z7984 Long term (current) use of oral hypoglycemic drugs: Secondary | ICD-10-CM | POA: Insufficient documentation

## 2022-02-25 DIAGNOSIS — D509 Iron deficiency anemia, unspecified: Secondary | ICD-10-CM

## 2022-02-25 DIAGNOSIS — Z801 Family history of malignant neoplasm of trachea, bronchus and lung: Secondary | ICD-10-CM | POA: Insufficient documentation

## 2022-02-25 DIAGNOSIS — K922 Gastrointestinal hemorrhage, unspecified: Secondary | ICD-10-CM | POA: Diagnosis not present

## 2022-02-25 LAB — CBC
HCT: 27 % — ABNORMAL LOW (ref 36.0–46.0)
Hemoglobin: 7.7 g/dL — ABNORMAL LOW (ref 12.0–15.0)
MCH: 21.9 pg — ABNORMAL LOW (ref 26.0–34.0)
MCHC: 28.5 g/dL — ABNORMAL LOW (ref 30.0–36.0)
MCV: 76.9 fL — ABNORMAL LOW (ref 80.0–100.0)
Platelets: 412 10*3/uL — ABNORMAL HIGH (ref 150–400)
RBC: 3.51 MIL/uL — ABNORMAL LOW (ref 3.87–5.11)
RDW: 15.9 % — ABNORMAL HIGH (ref 11.5–15.5)
WBC: 7.8 10*3/uL (ref 4.0–10.5)
nRBC: 0 % (ref 0.0–0.2)

## 2022-02-25 LAB — URINALYSIS, ROUTINE W REFLEX MICROSCOPIC
Bilirubin Urine: NEGATIVE
Glucose, UA: >=500 mg/dL — AB
Ketones, ur: NEGATIVE mg/dL
Nitrite: NEGATIVE
Protein, ur: NEGATIVE mg/dL
Specific Gravity, Urine: 1.018 (ref 1.005–1.030)
pH: 6 (ref 5.0–8.0)

## 2022-02-25 LAB — CBG MONITORING, ED
Glucose-Capillary: 489 mg/dL — ABNORMAL HIGH (ref 70–99)
Glucose-Capillary: 316 mg/dL — ABNORMAL HIGH (ref 70–99)
Glucose-Capillary: 94 mg/dL (ref 70–99)

## 2022-02-25 LAB — BASIC METABOLIC PANEL
Anion gap: 8 (ref 5–15)
BUN: 21 mg/dL (ref 8–23)
CO2: 28 mmol/L (ref 22–32)
Calcium: 9 mg/dL (ref 8.9–10.3)
Chloride: 93 mmol/L — ABNORMAL LOW (ref 98–111)
Creatinine, Ser: 1.23 mg/dL — ABNORMAL HIGH (ref 0.44–1.00)
GFR, Estimated: 48 mL/min — ABNORMAL LOW (ref >60)
Glucose, Bld: 511 mg/dL (ref 70–99)
Potassium: 4.7 mmol/L (ref 3.5–5.1)
Sodium: 129 mmol/L — ABNORMAL LOW (ref 135–145)

## 2022-02-25 MED ORDER — SODIUM CHLORIDE 0.9 % IV BOLUS
1000.0000 mL | Freq: Once | INTRAVENOUS | Status: AC
  Administered 2022-02-25: 1000 mL via INTRAVENOUS

## 2022-02-25 MED ORDER — METFORMIN HCL 500 MG PO TABS: 500.0000 mg | ORAL_TABLET | Freq: Two times a day (BID) | ORAL | 0 refills | Status: DC

## 2022-02-25 MED ORDER — INSULIN ASPART 100 UNIT/ML IJ SOLN
10.0000 [IU] | Freq: Once | INTRAMUSCULAR | Status: AC
  Administered 2022-02-25: 10 [IU] via SUBCUTANEOUS
  Filled 2022-02-25: qty 1

## 2022-02-25 MED ORDER — METFORMIN HCL 500 MG PO TABS
1000.0000 mg | ORAL_TABLET | Freq: Once | ORAL | Status: AC
  Administered 2022-02-25: 1000 mg via ORAL
  Filled 2022-02-25: qty 2

## 2022-02-25 NOTE — ED Provider Notes (Signed)
[facility] EMERGENCY DEPARTMENT Provider Note   CSN: [id number] Arrival date & time: 02/25/22  1614     History  Chief Complaint  Patient presents with   Hyperglycemia    Kayla Ryan is a 66 y.o. female.   Hyperglycemia   This patient is a 66 year old female, she has a history of diabetes, started on Farxiga February 02, 2022 approximately 3 weeks ago, presents after receiving a letter in the mail from her family doctor's office telling her that she was a diabetic, she had a blood sugar just over 500, she goes to [facility] in [place].  The patient reports to me that she has been having some increased urinary frequency, increased thirst and drinking plenty of liquids, she has had some dizziness.  She has no fevers chills nausea vomiting or diarrhea, no dysuria or hematuria, no swelling of the legs and no rashes.  Home Medications Prior to Admission medications   Medication Sig Start Date End Date Taking? Authorizing Provider  acyclovir (ZOVIRAX) 400 MG tablet Take 400 mg by mouth every evening. 05/18/21  Yes [provider]  cetirizine (ZYRTEC) 10 MG tablet Take 10 mg by mouth daily.   Yes [provider]  dicyclomine (BENTYL) 10 MG capsule Take 1 capsule (10 mg total) by mouth 4 (four) times daily -  before meals and at bedtime. Monitor for constipation, dry mouth, dizziness 01/24/22  Yes Annitta Needs, NP  doxepin (SINEQUAN) 10 MG capsule Take 10 mg by mouth at bedtime. 05/09/21  Yes [provider]  DULoxetine (CYMBALTA) 60 MG capsule Take 1 capsule (60 mg total) by mouth 2 (two) times daily. Patient taking differently: Take 60 mg by mouth every evening. 07/26/20  Yes Emokpae, Courage, MD  FARXIGA 10 MG TABS tablet Take 10 mg by mouth daily. 02/02/22  Yes [provider]  gabapentin (NEURONTIN) 800 MG tablet Take 800 mg by mouth 3 (three) times daily. 06/08/21  Yes [provider]  gemfibrozil (LOPID) 600 MG tablet Take 600  mg by mouth 2 (two) times daily before a meal.   Yes [provider]  HYDROcodone-acetaminophen (NORCO) 10-325 MG tablet Take 1 tablet by mouth every 4 (four) hours as needed. 02/22/22  Yes [provider]  hydrocortisone (ANUSOL-HC) 2.5 % rectal cream Place 1 Application rectally 2 (two) times daily. 01/24/22  Yes Annitta Needs, NP  ipratropium (ATROVENT HFA) 17 MCG/ACT inhaler Inhale 2 puffs into the lungs every 6 (six) hours.   Yes [provider]  LORazepam (ATIVAN) 0.5 MG tablet Take 1 tablet (0.5 mg total) by mouth 3 (three) times daily as needed for anxiety. 08/19/20  Yes Manuella Ghazi, Pratik D, DO  metFORMIN (GLUCOPHAGE) 500 MG tablet Take 1 tablet (500 mg total) by mouth 2 (two) times daily with a meal. 02/25/22  Yes Noemi Chapel, MD  montelukast (SINGULAIR) 10 MG tablet Take 10 mg by mouth daily. 02/23/21  Yes [provider]  NITROSTAT 0.4 MG SL tablet Place 0.4 mg under the tongue every 15 (fifteen) minutes as needed for chest pain.  09/22/13  Yes [provider]  omeprazole (PRILOSEC) 40 MG capsule Take 40 mg by mouth 2 (two) times daily. 05/13/21  Yes [provider]  ondansetron (ZOFRAN-ODT) 4 MG disintegrating tablet Take 2 mg by mouth every 6 (six) hours as needed. 12/18/20  Yes [provider]  simethicone (GAS-X) 80 MG chewable tablet Chew 1 tablet (80 mg total) by mouth every 6 (six) hours  as needed for flatulence. 06/27/21  Yes Shah, Pratik D, DO  Tiotropium Bromide-Olodaterol (STIOLTO RESPIMAT) 2.5-2.5 MCG/ACT AERS Inhale 2 each into the lungs daily.   Yes [provider]  isosorbide dinitrate (ISORDIL) 30 MG tablet Take 1 tablet by mouth daily. Patient not taking: Reported on 02/25/2022    [provider]      Allergies    Sulfa antibiotics, Dilaudid [hydromorphone hcl], Iron, Metronidazole, Prednisone, Cephalexin, Methotrexate derivatives, and Morphine and related    Review of Systems   Review of Systems   All other systems reviewed and are negative.   Physical Exam Updated Vital Signs BP (!) 137/52 (BP Location: Right Arm)   Pulse 89   Temp 98.6 F (37 C) (Oral)   Resp 18   Ht 1.727 m (5' 8" )   Wt 79.4 kg   SpO2 98%   BMI 26.61 kg/m  Physical Exam Vitals and nursing note reviewed.  Constitutional:      General: She is not in acute distress.    Appearance: She is well-developed.  HENT:     Head: Normocephalic and atraumatic.     Mouth/Throat:     Pharynx: No oropharyngeal exudate.  Eyes:     General: No scleral icterus.       Right eye: No discharge.        Left eye: No discharge.     Conjunctiva/sclera: Conjunctivae normal.     Pupils: Pupils are equal, round, and reactive to light.  Neck:     Thyroid: No thyromegaly.     Vascular: No JVD.  Cardiovascular:     Rate and Rhythm: Normal rate and regular rhythm.     Heart sounds: Normal heart sounds. No murmur heard.    No friction rub. No gallop.  Pulmonary:     Effort: Pulmonary effort is normal. No respiratory distress.     Breath sounds: Normal breath sounds. No wheezing or rales.  Abdominal:     General: Bowel sounds are normal. There is no distension.     Palpations: Abdomen is soft. There is no mass.     Tenderness: There is no abdominal tenderness.  Musculoskeletal:        General: No tenderness. Normal range of motion.     Cervical back: Normal range of motion and neck supple.     Right lower leg: No edema.     Left lower leg: No edema.  Lymphadenopathy:     Cervical: No cervical adenopathy.  Skin:    General: Skin is warm and dry.     Findings: No erythema or rash.  Neurological:     Mental Status: She is alert.     Coordination: Coordination normal.  Psychiatric:        Behavior: Behavior normal.     ED Results / Procedures / Treatments   Labs (all labs ordered are listed, but only abnormal results are displayed) Labs Reviewed  BASIC METABOLIC PANEL - Abnormal; Notable for the following  components:      Result Value   Sodium 129 (*)    Chloride 93 (*)    Glucose, Bld 511 (*)    Creatinine, Ser 1.23 (*)    GFR, Estimated 48 (*)    All other components within normal limits  CBC - Abnormal; Notable for the following components:   RBC 3.51 (*)    Hemoglobin 7.7 (*)    HCT 27.0 (*)    MCV 76.9 (*)    MCH 21.9 (*)  MCHC 28.5 (*)    RDW 15.9 (*)    Platelets 412 (*)    All other components within normal limits  URINALYSIS, ROUTINE W REFLEX MICROSCOPIC - Abnormal; Notable for the following components:   Color, Urine STRAW (*)    Glucose, UA >=500 (*)    Hgb urine dipstick SMALL (*)    Leukocytes,Ua TRACE (*)    Bacteria, UA RARE (*)    All other components within normal limits  CBG MONITORING, ED - Abnormal; Notable for the following components:   Glucose-Capillary 489 (*)    All other components within normal limits  CBG MONITORING, ED - Abnormal; Notable for the following components:   Glucose-Capillary 316 (*)    All other components within normal limits    EKG None  Radiology No results found.  Procedures Procedures    Medications Ordered in ED Medications  sodium chloride 0.9 % bolus 1,000 mL (0 mLs Intravenous Stopped 02/25/22 1914)  metFORMIN (GLUCOPHAGE) tablet 1,000 mg (1,000 mg Oral Given 02/25/22 1819)  insulin aspart (novoLOG) injection 10 Units (10 Units Subcutaneous Given 02/25/22 1821)    ED Course/ Medical Decision Making/ A&P                           Medical Decision Making Amount and/or Complexity of Data Reviewed Labs: ordered.  Risk Prescription drug management.   This patient presents to the ED for concern of hyperglycemia, this involves an extensive number of treatment options, and is a complaint that carries with it a high risk of complications and morbidity.  The differential diagnosis includes hyperglycemia, DKA, renal failure, electrolyte abnormalities   Co morbidities that complicate the patient  evaluation  Hypertension and hypercholesterolemia, new onset diabetes   Additional history obtained:  Additional history obtained from electronic medical record External records from outside source obtained and reviewed including admissions to the hospital over the last year for symptomatic anemia, upper endoscopies, COPD in the office   Lab Tests:  I Ordered, and personally interpreted labs.  The pertinent results include: CBC and metabolic panel showing hyperglycemia but no signs of DKA, she has a chronic anemia, the patient states she is under the care of Dr. Delton Coombes and had actually seen him earlier today.  Noted to have iron deficiency anemia.  Thankfully no signs of severe DKA, the patient has no acidosis, electrolytes are reassuring, repeat sugar was better after insulin given   Cardiac Monitoring: / EKG:  The patient was maintained on a cardiac monitor.  I personally viewed and interpreted the cardiac monitored which showed an underlying rhythm of: Normal sinus rhythm, no tachycardia    Problem List / ED Course / Critical interventions / Medication management  Hyperglycemia, no signs of DKA I ordered medication including IV fluids, antiemetics and insulin for hyperglycemia, already taking Wilder Glade, will add metformin Reevaluation of the patient after these medicines showed that the patient improved I have reviewed the patients home medicines and have made adjustments as needed Blood sugar improved prior to discharge   Social Determinants of Health:  Diabetic   Test / Admission - Considered:  Considered admission but patient is not in DKA and has normal vital signs, tolerating p.o., stable for discharge  I have discussed with the patient at the bedside the results, and the meaning of these results.  They have expressed her understanding to the need for follow-up with primary care physician        Final  Clinical Impression(s) / ED Diagnoses Final diagnoses:   Hyperglycemia    Rx / DC Orders ED Discharge Orders          Ordered    metFORMIN (GLUCOPHAGE) 500 MG tablet  2 times daily with meals        02/25/22 1959              Noemi Chapel, MD 02/25/22 2001

## 2022-02-25 NOTE — Progress Notes (Signed)
[facility] [address] [place] [address]   CLINIC:  Medical Oncology/Hematology  Patient Care Team: Guadlupe Spanish, MD as PCP - General (Internal Medicine) Derek Jack, MD as Medical Oncologist (Hematology)  CHIEF COMPLAINTS/PURPOSE OF CONSULTATION:  Evaluation for IDA  HISTORY OF PRESENTING ILLNESS:  Kayla Ryan 66 y.o. female is here because of evaluation for IDA, at the request of [facility].  Today she reports feeling well. She reports she was unable to tolerate iron tablets as they caused a rash, "kidney pain", and swelling of her tongue. She reports she has previously received iron infusions (Venofer 100) over 5 times which she was also unable to tolerate as she has severe nausea, rash, itching, and swelling over her tongue. She denies current bleeding issues. She reports she was diagnosed with DM yesterday. She eats one meal daily as she reports severe fatigue after meals. She has previously received multiple blood transfusions the last of which was in January 2023. She denies weight loss. She has been on 3L O2 24/7 since April 2022. She has a history of "precancer" treated with lumpectomy. 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 She reports she cannot tolerate steroids, and she will experience mood changes that she states "make me violent". She has history of Chron's disease. She is taking NORCO 10-325 MG about 4 times daily.   She lives at home on her own. 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 She quit smoking over 20 years ago. Her mother and father had lung cancer, and her niece had melanoma.   MEDICAL HISTORY:  Past Medical History:  Diagnosis Date   Anemia    Arthritis    Asthma    Chest pain    Chronic bronchitis    Congestive heart failure (CHF) (HCC)    COPD (chronic obstructive pulmonary disease) ([place])    Crohn disease ([place])    Emphysema    Emphysema of lung (HCC)    Fibromyalgia    Herpes    Hyperlipidemia     IBS (irritable bowel syndrome)    Kidney failure    Migraines    Muscular dystrophy ([place])    Neck pain    Plantar fasciitis    Polymyositis ([place])    Scoliosis     SURGICAL HISTORY: Past Surgical History:  Procedure Laterality Date   ABDOMINAL HYSTERECTOMY     AGILE CAPSULE N/A 06/24/2021   Procedure: AGILE CAPSULE;  Surgeon: Eloise Harman, DO;  [facility];  Service: Endoscopy;  Laterality: N/A;   AGILE CAPSULE N/A 07/22/2021   Procedure: AGILE CAPSULE;  Surgeon: Daneil Dolin, MD;  Location: [facility];  Service: Endoscopy;  Laterality: N/A;  7:30am   bone spur     CHOLECYSTECTOMY     COLONOSCOPY WITH PROPOFOL N/A 04/30/2021   Procedure: COLONOSCOPY WITH PROPOFOL;  Surgeon: Daneil Dolin, MD;  Location: [facility];  Service: Endoscopy;  Laterality: N/A;   ESOPHAGEAL DILATION N/A 04/30/2021   Procedure: ESOPHAGEAL DILATION;  Surgeon: Daneil Dolin, MD;  Location: [facility];  Service: Endoscopy;  Laterality: N/A;   ESOPHAGOGASTRODUODENOSCOPY (EGD) WITH PROPOFOL N/A 04/30/2021   Procedure: ESOPHAGOGASTRODUODENOSCOPY (EGD) WITH PROPOFOL;  Surgeon: Daneil Dolin, MD;  Location: [facility];  Service: Endoscopy;  Laterality: N/A;   GIVENS CAPSULE STUDY N/A 09/22/2021   Procedure: GIVENS CAPSULE STUDY;  Surgeon: Daneil Dolin, MD;  Location: [facility];  Service: Endoscopy;  Laterality: N/A;  7:30am   HERNIA REPAIR     LEFT HEART CATHETERIZATION WITH CORONARY ANGIOGRAM N/A 11/07/2013   Procedure: LEFT HEART CATHETERIZATION WITH CORONARY ANGIOGRAM;  Surgeon: Lorretta Harp, MD;  Location: [facility] CATH LAB;  Service: Cardiovascular;  Laterality: N/A;   MASTECTOMY PARTIAL / LUMPECTOMY     OOPHORECTOMY     ROTATOR CUFF REPAIR      SOCIAL HISTORY: Social History   Socioeconomic History   Marital status: Widowed    Spouse name: Not on file   Number of children: Not on file   Years of education: Not on file   Highest education level: Not on file   Occupational History   Not on file  Tobacco Use   Smoking status: Former    Packs/day: 1.00    Years: 49.00    Total pack years: 49.00    Types: Cigarettes    Start date: 08/08/1966    Quit date: 08/31/2005    Years since quitting: 16.4   Smokeless tobacco: Never  Vaping Use   Vaping Use: Never used  Substance and Sexual Activity   Alcohol use: No   Drug use: No    Comment: used marijuana in teens   Sexual activity: Not Currently    Birth control/protection: Surgical  Other Topics Concern   Not on file  Social History Narrative   Not on file   Social Determinants of Health   Financial Resource Strain: Not on file  Food Insecurity: Not on file  Transportation Needs: Not on file  Physical Activity: Not on file  Stress: Not on file  Social Connections: Not on file  Intimate Partner Violence: Not on file    FAMILY HISTORY: Family History  Problem Relation Age of Onset   Lung cancer Mother    COPD Father    Lung cancer Father    Lymphoma Father    Anxiety disorder Sister    Depression Brother     ALLERGIES:  is allergic to sulfa antibiotics, dilaudid [hydromorphone hcl], iron, metronidazole, prednisone, cephalexin, methotrexate derivatives, and morphine and related.  MEDICATIONS:  Current Outpatient Medications  Medication Sig Dispense Refill   acyclovir (ZOVIRAX) 400 MG tablet Take 400 mg by mouth 2 (two) times daily.     cetirizine (ZYRTEC) 10 MG tablet Take 10 mg by mouth daily.     dicyclomine (BENTYL) 10 MG capsule Take 1 capsule (10 mg total) by mouth 4 (four) times daily -  before meals and at bedtime. Monitor for constipation, dry mouth, dizziness 120 capsule 3   doxepin (SINEQUAN) 10 MG capsule Take 10 mg by mouth at bedtime.     DULoxetine (CYMBALTA) 60 MG capsule Take 1 capsule (60 mg total) by mouth 2 (two) times daily. 60 capsule 3   FARXIGA 10 MG TABS tablet Take 10 mg by mouth daily.     gabapentin (NEURONTIN) 800 MG tablet Take 800 mg by mouth 3  (three) times daily.     gemfibrozil (LOPID) 600 MG tablet Take 600 mg by mouth 2 (two) times daily before a meal.     HYDROcodone-acetaminophen (NORCO) 10-325 MG tablet Take 1 tablet by mouth every 4 (four) hours as needed.     hydrocortisone (ANUSOL-HC) 2.5 % rectal cream Place 1 Application rectally 2 (two) times daily. 30 g 1   ipratropium (ATROVENT HFA) 17 MCG/ACT inhaler Inhale 2 puffs into the lungs every 6 (six) hours.     isosorbide dinitrate (ISORDIL) 30 MG tablet Take 1 tablet by mouth daily.  LORazepam (ATIVAN) 0.5 MG tablet Take 1 tablet (0.5 mg total) by mouth 3 (three) times daily as needed for anxiety. 10 tablet 0   montelukast (SINGULAIR) 10 MG tablet Take 10 mg by mouth daily.     NITROSTAT 0.4 MG SL tablet Place 0.4 mg under the tongue every 15 (fifteen) minutes as needed for chest pain.      omeprazole (PRILOSEC) 40 MG capsule Take 40 mg by mouth 2 (two) times daily.     ondansetron (ZOFRAN-ODT) 4 MG disintegrating tablet Take 2 mg by mouth every 6 (six) hours as needed.     simethicone (GAS-X) 80 MG chewable tablet Chew 1 tablet (80 mg total) by mouth every 6 (six) hours as needed for flatulence. 30 tablet 0   No current facility-administered medications for this visit.    REVIEW OF SYSTEMS:   Review of Systems  Constitutional:  Positive for fatigue. Negative for appetite change and unexpected weight change.  Respiratory:  Positive for shortness of breath.   Gastrointestinal:  Positive for diarrhea.  Genitourinary:  Positive for frequency.   Musculoskeletal:  Positive for arthralgias. Back pain: 6/10. Neurological:  Positive for dizziness and numbness (feet).  Psychiatric/Behavioral:  Positive for sleep disturbance. The patient is nervous/anxious.   All other systems reviewed and are negative.    PHYSICAL EXAMINATION: ECOG PERFORMANCE STATUS: 1 - Symptomatic but completely ambulatory  Vitals:   02/25/22 0934  BP: (!) 150/59  Pulse: 99  Resp: 19  Temp: 98.5  F (36.9 C)  SpO2: 98%   Filed Weights   02/25/22 0934  Weight: 176 lb (79.8 kg)   Physical Exam Vitals reviewed.  Constitutional:      Appearance: Normal appearance.     Interventions: Nasal cannula in place.  Cardiovascular:     Rate and Rhythm: Normal rate and regular rhythm.     Pulses: Normal pulses.     Heart sounds: Normal heart sounds.  Pulmonary:     Effort: Pulmonary effort is normal.     Breath sounds: Normal breath sounds.  Abdominal:     Palpations: Abdomen is soft. There is no hepatomegaly, splenomegaly or mass.     Tenderness: There is no abdominal tenderness.  Neurological:     General: No focal deficit present.     Mental Status: She is alert and oriented to person, place, and time.  Psychiatric:        Mood and Affect: Mood normal.        Behavior: Behavior normal.      LABORATORY DATA:  I have reviewed the data as listed Recent Results (from the past 2160 hour(s))  Fe+TIBC+Fer     Status: Abnormal   Collection Time: 01/28/22  1:43 PM  Result Value Ref Range   Iron 25 (L) 45 - 160 mcg/dL   TIBC 585 (H) 250 - 450 mcg/dL (calc)   %SAT 4 (L) 16 - 45 % (calc)   Ferritin 5 (L) 16 - 288 ng/mL  CBC with Differential/Platelet     Status: Abnormal   Collection Time: 01/28/22  1:43 PM  Result Value Ref Range   WBC 7.8 3.8 - 10.8 Thousand/uL   RBC 3.63 (L) 3.80 - 5.10 Million/uL   Hemoglobin 8.2 (L) 11.7 - 15.5 g/dL   HCT 28.1 (L) 35.0 - 45.0 %   MCV 77.4 (L) 80.0 - 100.0 fL   MCH 22.6 (L) 27.0 - 33.0 pg   MCHC 29.2 (L) 32.0 - 36.0 g/dL   RDW 15.3 (  H) 11.0 - 15.0 %   Platelets 453 (H) 140 - 400 Thousand/uL   MPV 10.9 7.5 - 12.5 fL   Neutro Abs 4,594 1,500 - 7,800 cells/uL   Lymphs Abs 2,309 850 - 3,900 cells/uL   Absolute Monocytes 484 200 - 950 cells/uL   Eosinophils Absolute 335 15 - 500 cells/uL   Basophils Absolute 78 0 - 200 cells/uL   Neutrophils Relative % 58.9 %   Total Lymphocyte 29.6 %   Monocytes Relative 6.2 %   Eosinophils Relative  4.3 %   Basophils Relative 1.0 %    RADIOGRAPHIC STUDIES: I have personally reviewed the radiological images as listed and agreed with the findings in the report. No results found.  ASSESSMENT:  Severe iron deficiency anemia: - Etiology chronic GI blood loss, CKD and iron deficiency.  She has black tarry stools 3-4 times per month. - She cannot take oral iron due to allergies (tongue swells, rash, loin pain) - Colonoscopy and EGD (04/30/2021) - Last blood transfusion in January 2023.  Positive for ice pica.  No B symptoms. - On oxygen since April 2022, 24/7, 3 L/min. - She has received IV Venofer 100 mg every 5 days in [facility] under the direction of Dr. Harlow Asa.  She reportedly had nausea and some rash and itching after each infusion.   Social/family history: - She is widowed.  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  Quit smoking 20+ years ago. - Mother had lung cancer and father had lung cancer.  Niece had melanoma.   PLAN:  Severe iron deficiency anemia: - We reviewed recent labs from 01/28/2022 which showed hemoglobin 8.2, MCV 77 and ferritin 5.  She also has mild CKD. - We talked about parenteral iron therapy with Venofer.  As she had tolerability issues, we will give low-dose Venofer 100 mg once a week for 5 doses.  Reevaluate in 6 weeks with labs.   All questions were answered. The patient knows to call the clinic with any problems, questions or concerns.  Derek Jack, MD 02/25/22 10:23 AM  [place] [phone]   I, Thana Ates, am acting as a scribe for Dr. Derek Jack.  I, Derek Jack MD, have reviewed the above documentation for accuracy and completeness, and I agree with the above.

## 2022-02-25 NOTE — Telephone Encounter (Signed)
Called demist office for call back for information. Patient states that she is already cleared for surgery.

## 2022-02-25 NOTE — Patient Instructions (Addendum)
[place] at [facility] Discharge Instructions  You were seen and examined today by Dr. Delton Coombes. Dr. Delton Coombes is a hematologist, meaning that he specializes in blood abnormalities. Dr. Delton Coombes discussed your past medical history, family history of cancers/blood conditions and the events that led to you being here today.  You were referred to Dr. Delton Coombes due to iron deficiency anemia.  Dr. Delton Coombes has recommended iron infusions.  Follow-up as scheduled.  Thank you for choosing [place] at [facility] to provide your oncology and hematology care.  To afford each patient quality time with our provider, please arrive at least 15 minutes before your scheduled appointment time.   If you have a lab appointment with the [facility] please come in thru the Main Entrance and check in at the main information desk.  You need to re-schedule your appointment should you arrive 10 or more minutes late.  We strive to give you quality time with our providers, and arriving late affects you and other patients whose appointments are after yours.  Also, if you no show three or more times for appointments you may be dismissed from the clinic at the providers discretion.     Again, thank you for choosing [facility].  Our hope is that these requests will decrease the amount of time that you wait before being seen by our physicians.       _____________________________________________________________  Should you have questions after your visit to [facility], please contact our office at [phone] and follow the prompts.  Our office hours are 8:00 a.m. and 4:30 p.m. Monday - Friday.  Please note that voicemails left after 4:00 p.m. may not be returned until the following business day.  We are closed weekends and major holidays.  You do have access to a nurse 24-7, just call the main number to the clinic [phone] and do not  press any options, hold on the line and a nurse will answer the phone.    For prescription refill requests, have your pharmacy contact our office and allow 72 hours.

## 2022-02-25 NOTE — ED Triage Notes (Signed)
Pt blood glucose in 500's from a lab draw at pcp on 02/02/2022.  No prior hx of diabetes.

## 2022-02-25 NOTE — Discharge Instructions (Signed)
Your testing today reveals that you do have diabetes.  You are currently taking a medication called Wilder Glade which helps, we have added a medication called metformin which should be taken twice a day, you will need to start eating a diabetic diet so please do some research on that and read the attached instructions.  Call your doctor's office for a follow-up for Singh in the morning.  If you do not have a family doctor or are not happy with your current family doctor office please see the list below of local family doctors.  Emergency department for any severe worsening symptoms.  [facility] Primary Care Doctor List    Tula Nakayama, MD. Specialty: [facility] Medicine Contact information: [address], [address] [place] [address]  [phone]   Sallee Lange, MD. Specialty: [facility] Medicine Contact information: [place]  [place] [address]  [phone]   Rosita Fire, MD Specialty: Internal Medicine Contact information: [address] [address]  [phone]   Delphina Cahill, MD. Specialty: Internal Medicine Contact information: [place] [address]  [phone]    [facility] (Dr. Maudie Mercury) Specialty: Family Medicine Contact information: [place] [address]  [phone]   Leslie Andrea, MD. Specialty: [facility] Medicine Contact information: [place] [address]  [phone]   Asencion Noble, MD. Specialty: Internal Medicine Contact information: [address]  [place] [address]  [place]  [address] [place], [place] [address] [phone]  Services [facility] offers a variety of basic health services.  Services include but are not limited to: Blood pressure checks  Heart rate checks  Blood sugar checks  Urine analysis  Rapid strep tests   Pregnancy tests.  Health education and referrals  People needing more complex services will be directed to a physician online. Using these virtual visits, doctors can evaluate and prescribe medicine and treatments. There will be no medication on-site, though [facility] will help patients fill their prescriptions at little to no cost.   For More information please go to: [email or web address]

## 2022-03-01 ENCOUNTER — Inpatient Hospital Stay (HOSPITAL_COMMUNITY): Payer: Medicare Other

## 2022-03-01 NOTE — Telephone Encounter (Signed)
Jensen's office says they have not received clearance for oral surgery - they called my extension in error and I volunteered to send the message forward

## 2022-03-01 NOTE — Telephone Encounter (Signed)
Pt needs ov for pulmonary clearance Can see APP if needed   Called the pt and there was no answer- LMTCB.

## 2022-03-03 ENCOUNTER — Encounter: Payer: Self-pay | Admitting: Internal Medicine

## 2022-03-03 ENCOUNTER — Inpatient Hospital Stay (HOSPITAL_COMMUNITY): Payer: Medicare Other

## 2022-03-03 ENCOUNTER — Ambulatory Visit (INDEPENDENT_AMBULATORY_CARE_PROVIDER_SITE_OTHER): Payer: Medicare Other | Admitting: Internal Medicine

## 2022-03-03 VITALS — BP 133/69 | HR 81 | Temp 98.3°F | Resp 20

## 2022-03-03 VITALS — BP 134/64 | HR 99 | Ht 68.0 in | Wt 176.8 lb

## 2022-03-03 DIAGNOSIS — J439 Emphysema, unspecified: Secondary | ICD-10-CM | POA: Diagnosis not present

## 2022-03-03 DIAGNOSIS — D500 Iron deficiency anemia secondary to blood loss (chronic): Secondary | ICD-10-CM

## 2022-03-03 DIAGNOSIS — R1319 Other dysphagia: Secondary | ICD-10-CM

## 2022-03-03 DIAGNOSIS — Z87891 Personal history of nicotine dependence: Secondary | ICD-10-CM | POA: Diagnosis not present

## 2022-03-03 DIAGNOSIS — F411 Generalized anxiety disorder: Secondary | ICD-10-CM

## 2022-03-03 DIAGNOSIS — N189 Chronic kidney disease, unspecified: Secondary | ICD-10-CM | POA: Diagnosis not present

## 2022-03-03 DIAGNOSIS — J9611 Chronic respiratory failure with hypoxia: Secondary | ICD-10-CM

## 2022-03-03 DIAGNOSIS — B3731 Acute candidiasis of vulva and vagina: Secondary | ICD-10-CM

## 2022-03-03 DIAGNOSIS — Z808 Family history of malignant neoplasm of other organs or systems: Secondary | ICD-10-CM | POA: Diagnosis not present

## 2022-03-03 DIAGNOSIS — Z9981 Dependence on supplemental oxygen: Secondary | ICD-10-CM | POA: Insufficient documentation

## 2022-03-03 DIAGNOSIS — E1149 Type 2 diabetes mellitus with other diabetic neurological complication: Secondary | ICD-10-CM | POA: Diagnosis not present

## 2022-03-03 DIAGNOSIS — D509 Iron deficiency anemia, unspecified: Secondary | ICD-10-CM

## 2022-03-03 DIAGNOSIS — Z789 Other specified health status: Secondary | ICD-10-CM

## 2022-03-03 DIAGNOSIS — Z801 Family history of malignant neoplasm of trachea, bronchus and lung: Secondary | ICD-10-CM | POA: Insufficient documentation

## 2022-03-03 DIAGNOSIS — I10 Essential (primary) hypertension: Secondary | ICD-10-CM

## 2022-03-03 DIAGNOSIS — I5032 Chronic diastolic (congestive) heart failure: Secondary | ICD-10-CM

## 2022-03-03 DIAGNOSIS — G894 Chronic pain syndrome: Secondary | ICD-10-CM

## 2022-03-03 DIAGNOSIS — K922 Gastrointestinal hemorrhage, unspecified: Secondary | ICD-10-CM | POA: Insufficient documentation

## 2022-03-03 DIAGNOSIS — R110 Nausea: Secondary | ICD-10-CM

## 2022-03-03 MED ORDER — DEXCOM G7 SENSOR MISC: 5 refills | Status: DC

## 2022-03-03 MED ORDER — LORATADINE 10 MG PO TABS
10.0000 mg | ORAL_TABLET | Freq: Once | ORAL | Status: AC
  Administered 2022-03-03: 10 mg via ORAL
  Filled 2022-03-03: qty 1

## 2022-03-03 MED ORDER — METFORMIN HCL 500 MG PO TABS: 500.0000 mg | ORAL_TABLET | Freq: Two times a day (BID) | ORAL | 5 refills | Status: DC

## 2022-03-03 MED ORDER — FAMOTIDINE 20 MG PO TABS
20.0000 mg | ORAL_TABLET | Freq: Once | ORAL | Status: AC
  Administered 2022-03-03: 20 mg via ORAL
  Filled 2022-03-03: qty 1

## 2022-03-03 MED ORDER — FLUCONAZOLE 150 MG PO TABS: 150.0000 mg | ORAL_TABLET | Freq: Once | ORAL | 0 refills | Status: AC

## 2022-03-03 MED ORDER — FARXIGA 10 MG PO TABS: 10.0000 mg | ORAL_TABLET | Freq: Every day | ORAL | 5 refills | Status: DC

## 2022-03-03 MED ORDER — LORAZEPAM 0.5 MG PO TABS: 0.5000 mg | ORAL_TABLET | Freq: Three times a day (TID) | ORAL | 2 refills | Status: DC | PRN

## 2022-03-03 MED ORDER — BLOOD GLUCOSE METER KIT: PACK | 0 refills | Status: DC

## 2022-03-03 MED ORDER — SODIUM CHLORIDE 0.9 % IV SOLN: Freq: Once | INTRAVENOUS | Status: AC

## 2022-03-03 MED ORDER — ONDANSETRON 4 MG PO TBDP: 4.0000 mg | ORAL_TABLET | Freq: Four times a day (QID) | ORAL | 0 refills | Status: DC | PRN

## 2022-03-03 MED ORDER — TIRZEPATIDE 2.5 MG/0.5ML ~~LOC~~ SOAJ: 2.5000 mg | SUBCUTANEOUS | 0 refills | Status: DC

## 2022-03-03 MED ORDER — ACETAMINOPHEN 325 MG PO TABS
650.0000 mg | ORAL_TABLET | Freq: Once | ORAL | Status: AC
  Administered 2022-03-03: 650 mg via ORAL
  Filled 2022-03-03: qty 2

## 2022-03-03 MED ORDER — SODIUM CHLORIDE 0.9 % IV SOLN
8.0000 mg | Freq: Once | INTRAVENOUS | Status: AC
  Administered 2022-03-03: 8 mg via INTRAVENOUS
  Filled 2022-03-03: qty 4

## 2022-03-03 MED ORDER — SODIUM CHLORIDE 0.9 % IV SOLN
100.0000 mg | Freq: Once | INTRAVENOUS | Status: AC
  Administered 2022-03-03: 100 mg via INTRAVENOUS
  Filled 2022-03-03: qty 5

## 2022-03-03 NOTE — Assessment & Plan Note (Signed)
Takes Ativan 0.5 mg TID PRN On Cymbalta Refilled, PDMP reviewed

## 2022-03-03 NOTE — Patient Instructions (Signed)
Please start taking Mounjaro as prescribed.  Please continue taking Metformin and Farxiga.  Please continue to follow low carb diet and ambulate as tolerated.

## 2022-03-03 NOTE — Assessment & Plan Note (Signed)
Followed by GI On Omeprazole 40 mg QD

## 2022-03-03 NOTE — Assessment & Plan Note (Signed)
BP Readings from Last 1 Encounters:  03/03/22 134/64   Well-controlled with Imdur (for CAD/HFpEF) Counseled for compliance with the medications Advised DASH diet and moderate exercise/walking as tolerated

## 2022-03-03 NOTE — Progress Notes (Signed)
Pt presents today for Venofer IV iron infusion per provider's order. Vital signs stable and pt voiced no new complaints at this time.  Peripheral IV started with good blood return pre and post infusion.  Venofer 100 mg given today per MD orders. Tolerated infusion without adverse affects. Vital signs stable. No complaints at this time. Discharged from clinic ambulatory in stable condition. Alert and oriented x 3. F/U with [facility] as scheduled. Pt did not wait the 30 minute wait time per policy.

## 2022-03-03 NOTE — Assessment & Plan Note (Signed)
Followed by hematology Planned start Venofer

## 2022-03-03 NOTE — Assessment & Plan Note (Signed)
Had acute CHF exacerbation Currently euvolemic Referred to Cardiology

## 2022-03-03 NOTE — Assessment & Plan Note (Signed)
Due to COPD Followed by Pulmonology Has 3 lpm home O2 for chronic hypoxia

## 2022-03-03 NOTE — Patient Instructions (Signed)
[facility]  Discharge Instructions: Thank you for choosing [facility] to provide your oncology and hematology care.  If you have a lab appointment with the [facility], please come in thru the Main Entrance and check in at the main information desk.  Wear comfortable clothing and clothing appropriate for easy access to any Portacath or PICC line.   We strive to give you quality time with your provider. You may need to reschedule your appointment if you arrive late (15 or more minutes).  Arriving late affects you and other patients whose appointments are after yours.  Also, if you miss three or more appointments without notifying the office, you may be dismissed from the clinic at the provider's discretion.      For prescription refill requests, have your pharmacy contact our office and allow 72 hours for refills to be completed.    Today you received Venofer IV iron infusion.     BELOW ARE SYMPTOMS THAT SHOULD BE REPORTED IMMEDIATELY: *FEVER GREATER THAN 100.4 F (38 C) OR HIGHER *CHILLS OR SWEATING *NAUSEA AND VOMITING THAT IS NOT CONTROLLED WITH YOUR NAUSEA MEDICATION *UNUSUAL SHORTNESS OF BREATH *UNUSUAL BRUISING OR BLEEDING *URINARY PROBLEMS (pain or burning when urinating, or frequent urination) *BOWEL PROBLEMS (unusual diarrhea, constipation, pain near the anus) TENDERNESS IN MOUTH AND THROAT WITH OR WITHOUT PRESENCE OF ULCERS (sore throat, sores in mouth, or a toothache) UNUSUAL RASH, SWELLING OR PAIN  UNUSUAL VAGINAL DISCHARGE OR ITCHING   Items with * indicate a potential emergency and should be followed up as soon as possible or go to the Emergency Department if any problems should occur.  Please show the CHEMOTHERAPY ALERT CARD or IMMUNOTHERAPY ALERT CARD at check-in to the Emergency Department and triage nurse.  Should you have questions after your visit or need to cancel or reschedule your appointment, please contact [facility]  [phone]  and follow the prompts.  Office hours are 8:00 a.m. to 4:30 p.m. Monday - Friday. Please note that voicemails left after 4:00 p.m. may not be returned until the following business day.  We are closed weekends and major holidays. You have access to a nurse at all times for urgent questions. Please call the main number to the clinic [phone] and follow the prompts.  For any non-urgent questions, you may also contact your provider using MyChart. We now offer e-Visits for anyone 26 and older to request care online for non-urgent symptoms. For details visit [email or web address].   Also download the MyChart app! Go to the app store, search "MyChart", open the app, select [facility], and log in with your MyChart username and password.  Masks are optional in the cancer centers. If you would like for your care team to wear a mask while they are taking care of you, please let them know. For doctor visits, patients may have with them one support person who is at least 68 years old. At this time, visitors are not allowed in the infusion area.

## 2022-03-03 NOTE — Assessment & Plan Note (Signed)
Followed by Dr Myles Rosenthal On [facility] [facility] [employer]

## 2022-03-03 NOTE — Progress Notes (Signed)
New Patient Office Visit  Subjective:  Patient ID: Kayla Ryan, female    DOB: 06-04-1956  Age: 66 y.o. MRN: [id number]  CC:  Chief Complaint  Patient presents with   Establish Care    HPI Kayla Ryan is a 66 y.o. female with past medical history of HTN, HFpEF, COPD, GERD, type II DM with neuropathy, CKD, GAD and chronic pain syndrome who presents for establishing care.  HTN and HFpEF: BP is well-controlled. Takes medications regularly. Patient denies headache, dizziness, chest pain, dyspnea or palpitations. She takes Imdur and [place] currently.  Type II DM with neuropathy: Her HbA1C was 10.9 in 06/23.  She was recently placed on metformin after ER visit.  She is taking [place] for CKD.  She has chronic numbness of bilateral foot.  She takes gabapentin 800 mg daily currently.  Denies any polyuria or polyphagia.  She has chronic fatigue.  C/o vaginal area irritation for the last 1 week.  Denies any vaginal bleeding or discharge currently.  Chronic pain syndrome: She follows up with Dr. Hardin Negus for chronic pain syndrome.  She takes Norco 10-325 mg 4 times daily as needed.  She also takes Cymbalta.  GAD: She takes Ativan 0.5 mg 3 times daily as needed. She is also on Cymbalta.  She has history of COPD and chronic hypoxic respiratory failure.  She has acute Stiolto and as needed albuterol.  She uses 3 LPM O2 at home.  She has history of AVM of small bowel.  She has chronic GI bleeding, but denies any melena or hematochezia currently.  She has history of IDA, followed by hematology  - planned to start Venofer.     Past Medical History:  Diagnosis Date   Anemia    Arthritis    Asthma    Chest pain    Chronic bronchitis    Congestive heart failure (CHF) (HCC)    COPD (chronic obstructive pulmonary disease) ([place])    Crohn disease ([place])    Emphysema    Emphysema of lung (HCC)    Fibromyalgia    Herpes    Hyperlipidemia    IBS (irritable bowel syndrome)    Kidney  failure    Migraines    Muscular dystrophy ([place])    Neck pain    Plantar fasciitis    Polymyositis ([place])    Scoliosis     Past Surgical History:  Procedure Laterality Date   ABDOMINAL HYSTERECTOMY     AGILE CAPSULE N/A 06/24/2021   Procedure: AGILE CAPSULE;  Surgeon: Eloise Harman, DO;  [facility];  Service: Endoscopy;  Laterality: N/A;   AGILE CAPSULE N/A 07/22/2021   Procedure: AGILE CAPSULE;  Surgeon: Daneil Dolin, MD;  Location: [facility];  Service: Endoscopy;  Laterality: N/A;  7:30am   bone spur     CHOLECYSTECTOMY     COLONOSCOPY WITH PROPOFOL N/A 04/30/2021   Procedure: COLONOSCOPY WITH PROPOFOL;  Surgeon: Daneil Dolin, MD;  [facility]: [facility];  Service: Endoscopy;  Laterality: N/A;   ESOPHAGEAL DILATION N/A 04/30/2021   Procedure: ESOPHAGEAL DILATION;  Surgeon: Daneil Dolin, MD;  [facility]: [facility];  Service: Endoscopy;  Laterality: N/A;   ESOPHAGOGASTRODUODENOSCOPY (EGD) WITH PROPOFOL N/A 04/30/2021   Procedure: ESOPHAGOGASTRODUODENOSCOPY (EGD) WITH PROPOFOL;  Surgeon: Daneil Dolin, MD;  [facility];  Service: Endoscopy;  Laterality: N/A;   GIVENS CAPSULE STUDY N/A 09/22/2021   Procedure: GIVENS CAPSULE STUDY;  Surgeon: Daneil Dolin, MD;  [facility];  Service: Endoscopy;  Laterality: N/A;  7:30am   HERNIA REPAIR     LEFT HEART CATHETERIZATION WITH CORONARY ANGIOGRAM N/A 11/07/2013   Procedure: LEFT HEART CATHETERIZATION WITH CORONARY ANGIOGRAM;  Surgeon: Lorretta Harp, MD;  Location: [facility] CATH LAB;  Service: Cardiovascular;  Laterality: N/A;   MASTECTOMY PARTIAL / LUMPECTOMY     OOPHORECTOMY     ROTATOR CUFF REPAIR      Family History  Problem Relation Age of Onset   Lung cancer Mother    COPD Father    Lung cancer Father    Lymphoma Father    Anxiety disorder Sister    Depression Brother     Social History   Socioeconomic History   Marital status: Widowed    Spouse name: Not on file   Number  of children: Not on file   Years of education: Not on file   Highest education level: Not on file  Occupational History   Not on file  Tobacco Use   Smoking status: Former    Packs/day: 1.00    Years: 49.00    Total pack years: 49.00    Types: Cigarettes    Start date: 08/08/1966    Quit date: 08/31/2005    Years since quitting: 16.5   Smokeless tobacco: Never  Vaping Use   Vaping Use: Never used  Substance and Sexual Activity   Alcohol use: No   Drug use: No    Comment: used marijuana in teens   Sexual activity: Not Currently    Birth control/protection: Surgical  Other Topics Concern   Not on file  Social History Narrative   Not on file   Social Determinants of Health   Financial Resource Strain: Not on file  Food Insecurity: Not on file  Transportation Needs: Not on file  Physical Activity: Not on file  Stress: Not on file  Social Connections: Not on file  Intimate Partner Violence: Not on file    ROS Review of Systems  Constitutional:  Positive for fatigue. Negative for chills and fever.  HENT:  Negative for congestion, sinus pressure, sinus pain and sore throat.   Eyes:  Negative for pain and discharge.  Respiratory:  Positive for shortness of breath. Negative for cough.   Cardiovascular:  Negative for chest pain and palpitations.  Gastrointestinal:  Positive for constipation. Negative for abdominal pain, nausea and vomiting.  Endocrine: Negative for polydipsia and polyuria.  Genitourinary:  Negative for dysuria and hematuria.  Musculoskeletal:  Positive for arthralgias, back pain and gait problem. Negative for neck pain and neck stiffness.  Skin:  Negative for rash.  Neurological:  Negative for dizziness and weakness.  Psychiatric/Behavioral:  Positive for sleep disturbance. Negative for agitation and behavioral problems. The patient is nervous/anxious.     Objective:   Today's Vitals: BP 134/64 (BP Location: Left Arm, Cuff Size: Normal)   Pulse 99   Ht  _0  (1.727 m)   Wt 176 lb 12.8 oz (80.2 kg)   SpO2 96%   BMI 26.88 kg/m   Physical Exam Vitals reviewed.  Constitutional:      General: She is not in acute distress.    Appearance: She is not diaphoretic.  HENT:     Head: Normocephalic and atraumatic.     Nose: Nose normal.     Mouth/Throat:     Mouth: Mucous membranes are moist.  Eyes:     General: No scleral icterus.    Extraocular Movements: Extraocular  movements intact.  Cardiovascular:     Rate and Rhythm: Normal rate and regular rhythm.     Pulses: Normal pulses.     Heart sounds: Normal heart sounds. No murmur heard. Pulmonary:     Breath sounds: Normal breath sounds. No wheezing or rales.     Comments: On 3 LPM O2 Musculoskeletal:     Cervical back: Neck supple. No tenderness.     Right lower leg: No edema.     Left lower leg: No edema.  Skin:    General: Skin is warm.     Findings: No rash.  Neurological:     General: No focal deficit present.     Mental Status: She is alert and oriented to person, place, and time.     Sensory: Sensory deficit present.     Motor: Weakness (4/5 in b/l UE and LE) present.  Psychiatric:        Mood and Affect: Mood normal.        Behavior: Behavior normal.     Assessment & Plan:   Problem List Items Addressed This Visit       Cardiovascular and Mediastinum   Essential hypertension    BP Readings from Last 1 Encounters:  03/03/22 134/64  Well-controlled with Imdur (for CAD/HFpEF) Counseled for compliance with the medications Advised DASH diet and moderate exercise/walking as tolerated       (HFpEF) heart failure with preserved ejection fraction (HCC)    Had acute CHF exacerbation Currently euvolemic Referred to Cardiology      Relevant Orders   Ambulatory referral to Cardiology     Respiratory   Chronic obstructive pulmonary disease/Emphysema    Well controlled with Stiolto and as needed albuterol Followed by Pulmonology Has 3 lpm home O2 for chronic  hypoxia      Chronic respiratory failure with hypoxia (HCC)    Due to COPD Followed by Pulmonology Has 3 lpm home O2 for chronic hypoxia        Digestive   Esophageal dysphagia    Followed by GI On Omeprazole 40 mg QD        Endocrine   Type 2 diabetes mellitus with neurological complications ([place]) - Primary    No results found for: "HGBA1C"  On Farxiga and Metformin Uncontrolled Added Mounjaro Has had episodes of hypoglycemia in the past, prescribed CGM Advised to follow diabetic diet Has statin intolerance F/u CMP and lipid panel Diabetic foot exam: Today Diabetic eye exam: Advised to follow up with Ophthalmology for diabetic eye exam  Takes gabapentin 800 mg QD, advised to take it twice daily as she has persistent numbness and tingling of bilateral LE      Relevant Medications   Continuous Blood Gluc Sensor (DEXCOM G7 SENSOR) MISC   tirzepatide (MOUNJARO) 2.5 MG/0.5ML Pen   metFORMIN (GLUCOPHAGE) 500 MG tablet   FARXIGA 10 MG TABS tablet   blood glucose meter kit and supplies     Other   Iron deficiency anemia    Followed by hematology Planned start Venofer      GAD (generalized anxiety disorder)    Takes Ativan 0.5 mg TID PRN On Cymbalta Refilled, PDMP reviewed      Relevant Medications   LORazepam (ATIVAN) 0.5 MG tablet   Chronic pain    Followed by Dr Myles Rosenthal On Norco      Other Visit Diagnoses     Statin intolerance       Candidal vaginitis  Relevant Medications   fluconazole (DIFLUCAN) 150 MG tablet   Nausea       Relevant Medications   ondansetron (ZOFRAN-ODT) 4 MG disintegrating tablet       Outpatient Encounter Medications as of 03/03/2022  Medication Sig   acyclovir (ZOVIRAX) 400 MG tablet Take 400 mg by mouth every evening.   cetirizine (ZYRTEC) 10 MG tablet Take 10 mg by mouth daily.   Continuous Blood Gluc Sensor (DEXCOM G7 SENSOR) MISC Check blood glucose at least 3 times before meals and at bedtime, and as needed.    dicyclomine (BENTYL) 10 MG capsule Take 1 capsule (10 mg total) by mouth 4 (four) times daily -  before meals and at bedtime. Monitor for constipation, dry mouth, dizziness   doxepin (SINEQUAN) 10 MG capsule Take 10 mg by mouth at bedtime.   DULoxetine (CYMBALTA) 60 MG capsule Take 1 capsule (60 mg total) by mouth 2 (two) times daily. (Patient taking differently: Take 60 mg by mouth every evening.)   fluconazole (DIFLUCAN) 150 MG tablet Take 1 tablet (150 mg total) by mouth once for 1 dose.   gabapentin (NEURONTIN) 800 MG tablet Take 800 mg by mouth 3 (three) times daily.   gemfibrozil (LOPID) 600 MG tablet Take 600 mg by mouth 2 (two) times daily before a meal.   HYDROcodone-acetaminophen (NORCO) 10-325 MG tablet Take 1 tablet by mouth every 4 (four) hours as needed.   hydrocortisone (ANUSOL-HC) 2.5 % rectal cream Place 1 Application rectally 2 (two) times daily.   ipratropium (ATROVENT HFA) 17 MCG/ACT inhaler Inhale 2 puffs into the lungs every 6 (six) hours.   isosorbide dinitrate (ISORDIL) 30 MG tablet Take 1 tablet by mouth daily.   montelukast (SINGULAIR) 10 MG tablet Take 10 mg by mouth daily.   NITROSTAT 0.4 MG SL tablet Place 0.4 mg under the tongue every 15 (fifteen) minutes as needed for chest pain.    omeprazole (PRILOSEC) 40 MG capsule Take 40 mg by mouth 2 (two) times daily.   simethicone (GAS-X) 80 MG chewable tablet Chew 1 tablet (80 mg total) by mouth every 6 (six) hours as needed for flatulence.   Tiotropium Bromide-Olodaterol (STIOLTO RESPIMAT) 2.5-2.5 MCG/ACT AERS Inhale 2 each into the lungs daily.   tirzepatide [facility]) 2.5 MG/0.5ML Pen Inject 2.5 mg into the skin once a week.   [DISCONTINUED] blood glucose meter kit and supplies Dispense based on patient and insurance preference. Once daily testing DX E11.9   [DISCONTINUED] FARXIGA 10 MG TABS tablet Take 10 mg by mouth daily.   [DISCONTINUED] LORazepam (ATIVAN) 0.5 MG tablet Take 1 tablet (0.5 mg total) by mouth 3 (three)  times daily as needed for anxiety.   [DISCONTINUED] metFORMIN (GLUCOPHAGE) 500 MG tablet Take 1 tablet (500 mg total) by mouth 2 (two) times daily with a meal.   [DISCONTINUED] ondansetron (ZOFRAN-ODT) 4 MG disintegrating tablet Take 2 mg by mouth every 6 (six) hours as needed.   blood glucose meter kit and supplies Dispense based on patient and insurance preference. Once daily testing DX E11.9   FARXIGA 10 MG TABS tablet Take 1 tablet (10 mg total) by mouth daily.   LORazepam (ATIVAN) 0.5 MG tablet Take 1 tablet (0.5 mg total) by mouth 3 (three) times daily as needed for anxiety.   metFORMIN (GLUCOPHAGE) 500 MG tablet Take 1 tablet (500 mg total) by mouth 2 (two) times daily with a meal.   ondansetron (ZOFRAN-ODT) 4 MG disintegrating tablet Take 1 tablet (4 mg total) by mouth every  6 (six) hours as needed for nausea.   No facility-administered encounter medications on file as of 03/03/2022.    Follow-up: Return in about 1 month (around 04/03/2022) for DM.   Lindell Spar, MD

## 2022-03-03 NOTE — Assessment & Plan Note (Signed)
Well controlled with Stiolto and as needed albuterol Followed by Pulmonology Has 3 lpm home O2 for chronic hypoxia

## 2022-03-03 NOTE — Assessment & Plan Note (Addendum)
No results found for: "HGBA1C"  On Farxiga and Metformin Uncontrolled Added Mounjaro Has had episodes of hypoglycemia in the past, prescribed CGM Advised to follow diabetic diet Has statin intolerance F/u CMP and lipid panel Diabetic foot exam: Today Diabetic eye exam: Advised to follow up with Ophthalmology for diabetic eye exam  Takes gabapentin 800 mg QD, advised to take it twice daily as she has persistent numbness and tingling of bilateral LE

## 2022-03-04 ENCOUNTER — Encounter (HOSPITAL_COMMUNITY): Payer: Self-pay

## 2022-03-04 ENCOUNTER — Telehealth: Payer: Self-pay | Admitting: Internal Medicine

## 2022-03-04 NOTE — Progress Notes (Signed)
Patient called concerning symptoms after receiving iron yesterday. Patient is complaining of sever back pain, headache, nausea and itching.   Dr. Delton Coombes made aware of patient's symptoms. He said to have patient take ibuprofen or tylenol for back pain and headache. Nausea medication patient has Zofran at home and benadryl for itching.   Patient is aware and agreeable with plan. She will call back if any changes.

## 2022-03-04 NOTE — Telephone Encounter (Signed)
Pt called stating she is wanting to know when she needs to take the [facility] & when she is needing to check her blood sugary?/   Please advise

## 2022-03-04 NOTE — Telephone Encounter (Signed)
Patient advised with verbal understanding

## 2022-03-07 ENCOUNTER — Encounter (HOSPITAL_COMMUNITY): Payer: Self-pay

## 2022-03-07 ENCOUNTER — Inpatient Hospital Stay (HOSPITAL_COMMUNITY): Payer: Medicare Other

## 2022-03-07 ENCOUNTER — Other Ambulatory Visit: Payer: Self-pay | Admitting: Internal Medicine

## 2022-03-07 ENCOUNTER — Telehealth (HOSPITAL_COMMUNITY): Payer: Self-pay | Admitting: *Deleted

## 2022-03-07 ENCOUNTER — Telehealth: Payer: Self-pay

## 2022-03-07 DIAGNOSIS — I10 Essential (primary) hypertension: Secondary | ICD-10-CM

## 2022-03-07 DIAGNOSIS — I25118 Atherosclerotic heart disease of native coronary artery with other forms of angina pectoris: Secondary | ICD-10-CM

## 2022-03-07 MED ORDER — ISOSORBIDE DINITRATE 30 MG PO TABS: 30.0000 mg | ORAL_TABLET | Freq: Every day | ORAL | 1 refills | Status: DC

## 2022-03-07 NOTE — Telephone Encounter (Signed)
Patient called said only 1 part was sent in for Continuous Blood Gluc Sensor ([place]) MISC   Needs to send in all 3 parts of this system.  Pharmacy: [facility].   ALSO  FYI, to let her Dr Posey Pronto know. Iron infusion today because the nurse told her could not take th iron that was given Thursday, since she could not was able to get medicaid approved for the iron she use to take. She will be given this Wednesday. Was itching all over and vomiting and diarrhea which was plain water. Patient does not think she needs to be seen though.

## 2022-03-07 NOTE — Telephone Encounter (Signed)
Patient called to advise that she has had profound diarrhea and vomiting since Venofer last week.  She has been taking zofran, zyrtec and antidiarrheals.  Appointment cancelled for today and advised to go to the Urgent care or PCP.  Verbalized understanding.

## 2022-03-07 NOTE — Telephone Encounter (Signed)
Patient called said that Dr Posey Pronto was suppose to call in this medicine from there last visit and has not been called into her pharmacy  isosorbide dinitrate (ISORDIL) 30 MG tablet   Pharmacy: [facility]

## 2022-03-08 NOTE — Telephone Encounter (Signed)
Patient advised with verbal understanding

## 2022-03-09 ENCOUNTER — Inpatient Hospital Stay: Payer: Medicare Other | Attending: Hematology

## 2022-03-09 VITALS — BP 135/85 | HR 76 | Temp 98.1°F | Resp 20

## 2022-03-09 DIAGNOSIS — D509 Iron deficiency anemia, unspecified: Secondary | ICD-10-CM | POA: Diagnosis present

## 2022-03-09 MED ORDER — SODIUM CHLORIDE 0.9 % IV SOLN
8.0000 mg | Freq: Once | INTRAVENOUS | Status: AC
  Administered 2022-03-09: 8 mg via INTRAVENOUS
  Filled 2022-03-09: qty 4

## 2022-03-09 MED ORDER — LORATADINE 10 MG PO TABS
10.0000 mg | ORAL_TABLET | Freq: Once | ORAL | Status: AC
  Administered 2022-03-09: 10 mg via ORAL
  Filled 2022-03-09: qty 1

## 2022-03-09 MED ORDER — SODIUM CHLORIDE 0.9 % IV SOLN
100.0000 mg | Freq: Once | INTRAVENOUS | Status: AC
  Administered 2022-03-09: 100 mg via INTRAVENOUS
  Filled 2022-03-09: qty 100

## 2022-03-09 MED ORDER — ACETAMINOPHEN 325 MG PO TABS
650.0000 mg | ORAL_TABLET | Freq: Once | ORAL | Status: AC
  Administered 2022-03-09: 650 mg via ORAL
  Filled 2022-03-09: qty 2

## 2022-03-09 MED ORDER — FAMOTIDINE 20 MG PO TABS
20.0000 mg | ORAL_TABLET | Freq: Once | ORAL | Status: AC
  Administered 2022-03-09: 20 mg via ORAL
  Filled 2022-03-09: qty 1

## 2022-03-09 MED ORDER — SODIUM CHLORIDE 0.9 % IV SOLN: Freq: Once | INTRAVENOUS | Status: AC

## 2022-03-09 NOTE — Progress Notes (Signed)
Patient presents today for Venofer infusion per providers order.  Vital signs WNL.  Patient has no new complaints at this time.  Peripheral IV started and blood return noted pre and post infusion.  Venofer given today per MD orders.  Stable during infusion without adverse affects.  Vital signs stable.  No complaints at this time.  Discharge from clinic ambulatory in stable condition.  Alert and oriented X 3.  Follow up with [facility] as scheduled.

## 2022-03-09 NOTE — Patient Instructions (Signed)
[place]  Discharge Instructions: Thank you for choosing [facility] to provide your oncology and hematology care.  If you have a lab appointment with the [facility], please come in thru the Main Entrance and check in at the main information desk.  Wear comfortable clothing and clothing appropriate for easy access to any Portacath or PICC line.   We strive to give you quality time with your provider. You may need to reschedule your appointment if you arrive late (15 or more minutes).  Arriving late affects you and other patients whose appointments are after yours.  Also, if you miss three or more appointments without notifying the office, you may be dismissed from the clinic at the provider's discretion.      For prescription refill requests, have your pharmacy contact our office and allow 72 hours for refills to be completed.    Today you received the following chemotherapy and/or immunotherapy agents Venofer      To help prevent nausea and vomiting after your treatment, we encourage you to take your nausea medication as directed.  BELOW ARE SYMPTOMS THAT SHOULD BE REPORTED IMMEDIATELY: *FEVER GREATER THAN 100.4 F (38 C) OR HIGHER *CHILLS OR SWEATING *NAUSEA AND VOMITING THAT IS NOT CONTROLLED WITH YOUR NAUSEA MEDICATION *UNUSUAL SHORTNESS OF BREATH *UNUSUAL BRUISING OR BLEEDING *URINARY PROBLEMS (pain or burning when urinating, or frequent urination) *BOWEL PROBLEMS (unusual diarrhea, constipation, pain near the anus) TENDERNESS IN MOUTH AND THROAT WITH OR WITHOUT PRESENCE OF ULCERS (sore throat, sores in mouth, or a toothache) UNUSUAL RASH, SWELLING OR PAIN  UNUSUAL VAGINAL DISCHARGE OR ITCHING   Items with * indicate a potential emergency and should be followed up as soon as possible or go to the Emergency Department if any problems should occur.  Please show the CHEMOTHERAPY ALERT CARD or IMMUNOTHERAPY ALERT CARD at check-in to the Emergency  Department and triage nurse.  Should you have questions after your visit or need to cancel or reschedule your appointment, please contact [facility] [phone]  and follow the prompts.  Office hours are 8:00 a.m. to 4:30 p.m. Monday - Friday. Please note that voicemails left after 4:00 p.m. may not be returned until the following business day.  We are closed weekends and major holidays. You have access to a nurse at all times for urgent questions. Please call the main number to the clinic [phone] and follow the prompts.  For any non-urgent questions, you may also contact your provider using MyChart. We now offer e-Visits for anyone 68 and older to request care online for non-urgent symptoms. For details visit [email or web address].   Also download the MyChart app! Go to the app store, search "MyChart", open the app, select [facility], and log in with your MyChart username and password.  Masks are optional in the cancer centers. If you would like for your care team to wear a mask while they are taking care of you, please let them know. For doctor visits, patients may have with them one support person who is at least 68 years old. At this time, visitors are not allowed in the infusion area.

## 2022-03-10 ENCOUNTER — Ambulatory Visit (INDEPENDENT_AMBULATORY_CARE_PROVIDER_SITE_OTHER): Payer: Medicare Other | Admitting: Adult Health

## 2022-03-10 ENCOUNTER — Ambulatory Visit (INDEPENDENT_AMBULATORY_CARE_PROVIDER_SITE_OTHER): Payer: Medicare Other

## 2022-03-10 ENCOUNTER — Encounter: Payer: Self-pay | Admitting: Adult Health

## 2022-03-10 VITALS — BP 154/60 | HR 96 | Temp 98.9°F | Ht 68.0 in | Wt 178.0 lb

## 2022-03-10 DIAGNOSIS — D649 Anemia, unspecified: Secondary | ICD-10-CM | POA: Diagnosis not present

## 2022-03-10 DIAGNOSIS — J9611 Chronic respiratory failure with hypoxia: Secondary | ICD-10-CM | POA: Diagnosis not present

## 2022-03-10 DIAGNOSIS — Z01811 Encounter for preprocedural respiratory examination: Secondary | ICD-10-CM | POA: Diagnosis not present

## 2022-03-10 DIAGNOSIS — J449 Chronic obstructive pulmonary disease, unspecified: Secondary | ICD-10-CM

## 2022-03-10 MED ORDER — DEXCOM G7 RECEIVER DEVI: 0 refills | Status: DC

## 2022-03-10 NOTE — Assessment & Plan Note (Signed)
Continue follow-up with primary care

## 2022-03-10 NOTE — Telephone Encounter (Signed)
OV notes and clearance form have been faxed back to Diona Browner DMD,PA. Nothing further needed at this time.

## 2022-03-10 NOTE — Patient Instructions (Addendum)
Continue on Stiolto 2 puffs daily, rinse after use Albuterol or Duoneb As needed   Activity as tolerated.  Continue on Oxygen 3l/m .  Surgical risk assessment is Moderate to High  Follow-up in the office in 4  months with Dr. Lamonte Sakai  And As needed   Please contact office for sooner follow up if symptoms do not improve or worsen or seek emergency care

## 2022-03-10 NOTE — Telephone Encounter (Signed)
Called patient, asked her to call pharmacy and fax over what is needed, patient said they have faxed twice, will call back

## 2022-03-10 NOTE — Assessment & Plan Note (Signed)
Severe COPD with asthma and emphysema.  Currently compensated on present regimen with Stiolto.  Appears to be at baseline.  With no flare of symptoms.  She remains at home and is independent still able to do ADLs, house chores and driving. Check chest x-ray today.  Plan  Patient Instructions  Continue on Stiolto 2 puffs daily, rinse after use Albuterol or Duoneb As needed   Activity as tolerated.  Continue on Oxygen 3l/m .  Surgical risk assessment is Moderate to High  Follow-up in the office in 4  months with Dr. Lamonte Sakai  And As needed   Please contact office for sooner follow up if symptoms do not improve or worsen or seek emergency care

## 2022-03-10 NOTE — Assessment & Plan Note (Signed)
Continue on oxygen to maintain O2 saturations greater than 88 to 90%.  No increased oxygen demands.  Plan  Patient Instructions  Continue on Stiolto 2 puffs daily, rinse after use Albuterol or Duoneb As needed   Activity as tolerated.  Continue on Oxygen 3l/m .  Surgical risk assessment is Moderate to High  Follow-up in the office in 4  months with Dr. Lamonte Sakai  And As needed   Please contact office for sooner follow up if symptoms do not improve or worsen or seek emergency care

## 2022-03-10 NOTE — Assessment & Plan Note (Signed)
Patient has underlying severe COPD that is oxygen dependent.  She appears to be at baseline without flare of symptoms.  Symptoms are well controlled on present regimen.  No recent hospitalizations for her breathing no recent antibiotics or steroids.  Patient will be considered a moderate to high risk due to her underlying severe COPD and oxygen dependent.  Went over pulmonary pre op risk assessment in detail.     Major Pulmonary risks identified in the multifactorial risk analysis are but not limited to a) pneumonia; b) recurrent intubation risk; c) prolonged or recurrent acute respiratory failure needing mechanical ventilation; d) prolonged hospitalization; e) DVT/Pulmonary embolism; f) Acute Pulmonary edema  Recommend 1. Short duration of surgery as much as possible and avoid paralytic if possible.  2. Recovery in step down or ICU with Pulmonary consultation if indicated.   Aggressive pulmonary toilet with o2, bronchodilatation, and incentive spirometry and early ambulation

## 2022-03-10 NOTE — Telephone Encounter (Signed)
Have not received a fax from pharmacy. Will be awaiting

## 2022-03-10 NOTE — Addendum Note (Signed)
Addended byIhor Dow on: 03/10/2022 04:44 PM   Modules accepted: Orders

## 2022-03-10 NOTE — Telephone Encounter (Signed)
Patient called the pharmacy and they could not refill the transmitter because do not have prescription on file for it. Nurse will have to call in the part of transmitter it needs prescription sent in.

## 2022-03-10 NOTE — Progress Notes (Signed)
_0  ID: Kayla Ryan, female    DOB: [birth year], 66 y.o.   MRN: [id number]  Chief Complaint  Patient presents with   Follow-up    Referring provider: Lindell Spar, MD  HPI: 66 year old female former smoker with a 25-pack-year history followed for COPD and asthma and chronic rhinitis.  Chronic respiratory failure on oxygen at 3 L Medical history significant for inflammatory myopathy, fibromyalgia, Crohn's disease and IBS, chronic anemia   TEST/EVENTS :  CT chest April 29, 2021 negative for PE, bilateral lower lobe consolidation left greater than right  03/10/2022 Follow up : COPD with asthma, emphysema, chronic respiratory failure on oxygen Patient presents for a 109-monthfollow-up.  Patient has underlying COPD with asthma and emphysema.  She is on oxygen at home 3l/m . No increased oxygen demands. .  Overall says breathing is doing okay. No flare of cough or wheezing .  She remains on Stiolto daily.  She is on Singulair and Zyrtec daily . Rare albuterol use. No recent antibiotics or steroids.  Lives at home , drives. Does all house chores and cooking , shopping .   Recently diagnosed with Diabetes, started on Metformin , Farxiga , and Mounjaro . Blood sugars are improved, 150 to 180.   Has upcoming dental surgery with dental removal and gum surgery[facility] the [facility] in order to be able to get her dentures.  Right now has trouble eating anything but soft foods.  Has several broken teeth.     Allergies  Allergen Reactions   Sulfa Antibiotics Shortness Of Breath, Swelling and Rash   Dilaudid [Hydromorphone Hcl]     Makes me crazy   Iron     Throat starts closing up, tongue swells, severe headaches and backpain   Metronidazole Diarrhea and Nausea And Vomiting   Prednisone Other (See Comments)    angry angry    Statins     Other reaction(s): Other (See Comments) Unknown    Cephalexin Diarrhea and Nausea  And Vomiting   Methotrexate Derivatives Swelling and Rash   Morphine And Related Anxiety    "exreme mood swings"    Immunization History  Administered Date(s) Administered   Fluad Quad(high Dose 65+) 05/27/2020   Influenza-Unspecified 06/17/2016, 05/25/2021   PFIZER(Purple Top)SARS-COV-2 Vaccination 10/02/2019, 11/20/2019   PNEUMOCOCCAL CONJUGATE-20 09/15/2021   Tdap 10/31/2019    Past Medical History:  Diagnosis Date   Anemia    Arthritis    Asthma    Chest pain    Chronic bronchitis    Congestive heart failure (CHF) (HCC)    COPD (chronic obstructive pulmonary disease) ([place]    Crohn disease (HCC)    Emphysema    Emphysema of lung (HCC)    Fibromyalgia    Herpes    Hyperlipidemia    IBS (irritable bowel syndrome)    Kidney failure    Migraines    Muscular dystrophy ([place]    Neck pain    Plantar fasciitis    Polymyositis ([place]    Scoliosis     Tobacco History: Social History   Tobacco Use  Smoking Status Former   Packs/day: 1.00   Years: 49.00   Total pack years: 49.00   Types: Cigarettes   Start date: 08/08/1966   Quit date: 08/31/2005   Years since quitting: 16.5  Smokeless Tobacco Never   Counseling given: Not Answered   Outpatient Medications Prior to Visit  Medication Sig  Dispense Refill   acyclovir (ZOVIRAX) 400 MG tablet Take 400 mg by mouth every evening.     blood glucose meter kit and supplies Dispense based on patient and insurance preference. Once daily testing DX E11.9 1 each 0   cetirizine (ZYRTEC) 10 MG tablet Take 10 mg by mouth daily.     Continuous Blood Gluc Sensor (DEXCOM G7 SENSOR) MISC Check blood glucose at least 3 times before meals and at bedtime, and as needed. 3 each 5   dicyclomine (BENTYL) 10 MG capsule Take 1 capsule (10 mg total) by mouth 4 (four) times daily -  before meals and at bedtime. Monitor for constipation, dry mouth, dizziness 120 capsule 3   doxepin (SINEQUAN) 10 MG capsule Take 10 mg by mouth at bedtime.      DULoxetine (CYMBALTA) 60 MG capsule Take 1 capsule (60 mg total) by mouth 2 (two) times daily. (Patient taking differently: Take 60 mg by mouth every evening.) 60 capsule 3   FARXIGA 10 MG TABS tablet Take 1 tablet (10 mg total) by mouth daily. 30 tablet 5   gabapentin (NEURONTIN) 800 MG tablet Take 800 mg by mouth 3 (three) times daily.     gemfibrozil (LOPID) 600 MG tablet Take 600 mg by mouth 2 (two) times daily before a meal.     HYDROcodone-acetaminophen (NORCO) 10-325 MG tablet Take 1 tablet by mouth every 4 (four) hours as needed.     hydrocortisone (ANUSOL-HC) 2.5 % rectal cream Place 1 Application rectally 2 (two) times daily. 30 g 1   ipratropium (ATROVENT HFA) 17 MCG/ACT inhaler Inhale 2 puffs into the lungs every 6 (six) hours.     isosorbide dinitrate (ISORDIL) 30 MG tablet Take 1 tablet (30 mg total) by mouth daily. 90 tablet 1   LORazepam (ATIVAN) 0.5 MG tablet Take 1 tablet (0.5 mg total) by mouth 3 (three) times daily as needed for anxiety. 90 tablet 2   metFORMIN (GLUCOPHAGE) 500 MG tablet Take 1 tablet (500 mg total) by mouth 2 (two) times daily with a meal. 60 tablet 5   montelukast (SINGULAIR) 10 MG tablet Take 10 mg by mouth daily.     NITROSTAT 0.4 MG SL tablet Place 0.4 mg under the tongue every 15 (fifteen) minutes as needed for chest pain.      omeprazole (PRILOSEC) 40 MG capsule Take 40 mg by mouth 2 (two) times daily.     ondansetron (ZOFRAN-ODT) 4 MG disintegrating tablet Take 1 tablet (4 mg total) by mouth every 6 (six) hours as needed for nausea. 20 tablet 0   simethicone (GAS-X) 80 MG chewable tablet Chew 1 tablet (80 mg total) by mouth every 6 (six) hours as needed for flatulence. 30 tablet 0   Tiotropium Bromide-Olodaterol (STIOLTO RESPIMAT) 2.5-2.5 MCG/ACT AERS Inhale 2 each into the lungs daily.     tirzepatide [facility]) 2.5 MG/0.5ML Pen Inject 2.5 mg into the skin once a week. 2 mL 0   No facility-administered medications prior to visit.     Review of  Systems:   Constitutional:   No  weight loss, night sweats,  Fevers, chills,  +fatigue, or  lassitude.  HEENT:   No headaches,  Difficulty swallowing,  Tooth/dental problems, or  Sore throat,                No sneezing, itching, ear ache, nasal congestion, post nasal drip,   CV:  No chest pain,  Orthopnea, PND, swelling in lower extremities, anasarca, dizziness, palpitations, syncope.  GI  No heartburn, indigestion, abdominal pain, nausea, vomiting, diarrhea, change in bowel habits, loss of appetite, bloody stools.   Resp: .  No excess mucus, no productive cough,  No non-productive cough,  No coughing up of blood.  No change in color of mucus.  No wheezing.  No chest wall deformity  Skin: no rash or lesions.  GU: no dysuria, change in color of urine, no urgency or frequency.  No flank pain, no hematuria   MS:  No joint pain or swelling.  No decreased range of motion.  No back pain.    Physical Exam  BP (!) 154/60 (BP Location: Left Arm, Patient Position: Sitting, Cuff Size: Normal)   Pulse 96   Temp 98.9 F (37.2 C) (Oral)   Ht _0  (1.727 m)   Wt 178 lb (80.7 kg)   SpO2 97%   BMI 27.06 kg/m   GEN: A/Ox3; pleasant , NAD, well nourished , on O2    HEENT:  [place]/AT,   NOSE-clear, THROAT-clear, no lesions, no postnasal drip or exudate noted.  Poor dentition  NECK:  Supple w/ fair ROM; no JVD; normal carotid impulses w/o bruits; no thyromegaly or nodules palpated; no lymphadenopathy.    RESP  Clear  P & A; w/o, wheezes/ rales/ or rhonchi. no accessory muscle use, no dullness to percussion  CARD:  RRR, no m/r/g, no peripheral edema, pulses intact, no cyanosis or clubbing.  GI:   Soft & nt; nml bowel sounds; no organomegaly or masses detected.   Musco: Warm bil, no deformities or joint swelling noted.   Neuro: alert, no focal deficits noted.    Skin: Warm, no lesions or rashes    Lab Results:  CBC   BMET   ProBNP   Imaging: No results found.  acetaminophen  (TYLENOL) tablet 650 mg     Date Action Dose Route User   03/03/2022 1504 Given 650 mg Oral [place], Marquita M, RN      acetaminophen (TYLENOL) tablet 650 mg     Date Action Dose Route User   03/09/2022 1418 Given 650 mg Oral Handy, Lovina Reach, RN      famotidine (PEPCID) tablet 20 mg     Date Action Dose Route User   03/03/2022 1504 Given 20 mg Oral [place], Marquita M, RN      famotidine (PEPCID) tablet 20 mg     Date Action Dose Route User   03/09/2022 1418 Given 20 mg Oral Handy, Lovina Reach, RN      iron sucrose (VENOFER) 100 mg in sodium chloride 0.9 % 100 mL IVPB     Date Action Dose Route User   03/03/2022 1556 Infusion Verify (none) Intravenous Terrace Arabia, RN   03/03/2022 1556 New Bag/Given 100 mg Intravenous Acquanetta Chain M, RN      iron sucrose (VENOFER) 100 mg in sodium chloride 0.9 % 100 mL IVPB     Date Action Dose Route User   03/09/2022 1524 New Bag/Given 100 mg Intravenous Fayne Norrie, RN      loratadine (CLARITIN) tablet 10 mg     Date Action Dose Route User   03/03/2022 1504 Given 10 mg Oral [place], Marquita M, RN      loratadine (CLARITIN) tablet 10 mg     Date Action Dose Route User   03/09/2022 1419 Given 10 mg Oral Handy, Tandra C, RN      ondansetron (ZOFRAN) 8 mg in sodium chloride 0.9 % 50 mL IVPB  Date Action Dose Route User   03/03/2022 1508 Infusion Verify (none) Intravenous Terrace Arabia, [place]   03/03/2022 1508 New Bag/Given 8 mg Intravenous Acquanetta Chain M, RN      ondansetron [facility] 8 mg in sodium chloride 0.9 % 50 mL IVPB     Date Action Dose Route User   03/09/2022 1442 New Bag/Given 8 mg Intravenous Fayne Norrie, RN      0.9 %  sodium chloride infusion     Date Action Dose Route User   03/03/2022 1619 Rate/Dose Change (none) Intravenous Terrace Arabia, RN   03/03/2022 1618 Rate/Dose Change (none) Intravenous Terrace Arabia, RN   03/03/2022 1618 Rate/Dose  Change (none) Intravenous Terrace Arabia, RN   03/03/2022 1555 Rate/Dose Change (none) Intravenous Terrace Arabia, RN   03/03/2022 1555 Rate/Dose Change (none) Intravenous Acquanetta Chain M, RN      0.9 %  sodium chloride infusion     Date Action Dose Route User   03/09/2022 1410 New Bag/Given (none) Intravenous Fayne Norrie, RN          Latest Ref Rng & Units 04/28/2020    3:09 PM  PFT Results  FVC-Pre L 2.15   FVC-Predicted Pre % 57   Pre FEV1/FVC % % 59   FEV1-Pre L 1.27   FEV1-Predicted Pre % 44   DLCO uncorrected ml/min/mmHg 13.31   DLCO UNC% % 57   DLCO corrected ml/min/mmHg 13.31   DLCO COR %Predicted % 57   DLVA Predicted % 77     No results found for: "NITRICOXIDE"      Assessment & Plan:   Chronic obstructive pulmonary disease/Emphysema Severe COPD with asthma and emphysema.  Currently compensated on present regimen with Stiolto.  Appears to be at baseline.  With no flare of symptoms.  She remains at home and is independent still able to do ADLs, house chores and driving. Check chest x-ray today.  Plan  Patient Instructions  Continue on Stiolto 2 puffs daily, rinse after use Albuterol or Duoneb As needed   Activity as tolerated.  Continue on Oxygen 3l/m .  Surgical risk assessment is Moderate to High  Follow-up in the office in 4  months with Dr. Lamonte Sakai  And As needed   Please contact office for sooner follow up if symptoms do not improve or worsen or seek emergency care      Chronic respiratory failure with hypoxia ([place]) Continue on oxygen to maintain O2 saturations greater than 88 to 90%.  No increased oxygen demands.  Plan  Patient Instructions  Continue on Stiolto 2 puffs daily, rinse after use Albuterol or Duoneb As needed   Activity as tolerated.  Continue on Oxygen 3l/m .  Surgical risk assessment is Moderate to High  Follow-up in the office in 4  months with Dr. Lamonte Sakai  And As needed   Please contact office for sooner  follow up if symptoms do not improve or worsen or seek emergency care      Symptomatic anemia Continue follow-up with primary care  Preop pulmonary/respiratory exam Patient has underlying severe COPD that is oxygen dependent.  She appears to be at baseline without flare of symptoms.  Symptoms are well controlled on present regimen.  No recent hospitalizations for her breathing no recent antibiotics or steroids.  Patient will be considered a moderate to high risk due to her underlying severe COPD and oxygen dependent.  Went over pulmonary pre op risk assessment in detail.  Major Pulmonary risks identified in the multifactorial risk analysis are but not limited to a) pneumonia; b) recurrent intubation risk; c) prolonged or recurrent acute respiratory failure needing mechanical ventilation; d) prolonged hospitalization; e) DVT/Pulmonary embolism; f) Acute Pulmonary edema  Recommend 1. Short duration of surgery as much as possible and avoid paralytic if possible.  2. Recovery in step down or ICU with Pulmonary consultation if indicated.   Aggressive pulmonary toilet with o2, bronchodilatation, and incentive spirometry and early ambulation       Rexene Edison, NP 03/10/2022

## 2022-03-10 NOTE — Telephone Encounter (Signed)
Please have her reach out to pharmacy and ask them to fax over what they are needing. There are no other parts that she needs to dexcom.

## 2022-03-10 NOTE — Telephone Encounter (Signed)
The pharmacy that has been calling patient waiting on the 2nd and 3rd part the remaining 2 parts of the Dexcom G7, patient only have the sensors. Please contact patient with questions [phone], can leave detailed message and patient can give [place] a call back.  Pharmacy: [facility]

## 2022-03-11 ENCOUNTER — Encounter (HOSPITAL_COMMUNITY): Payer: Self-pay

## 2022-03-11 ENCOUNTER — Inpatient Hospital Stay (HOSPITAL_COMMUNITY): Payer: Medicare Other

## 2022-03-11 NOTE — Telephone Encounter (Signed)
Notified pt. 

## 2022-03-14 ENCOUNTER — Inpatient Hospital Stay (HOSPITAL_COMMUNITY): Payer: Medicare Other

## 2022-03-14 ENCOUNTER — Other Ambulatory Visit: Payer: Self-pay

## 2022-03-14 ENCOUNTER — Encounter (HOSPITAL_COMMUNITY): Payer: Self-pay

## 2022-03-14 ENCOUNTER — Ambulatory Visit (INDEPENDENT_AMBULATORY_CARE_PROVIDER_SITE_OTHER): Payer: Medicare Other

## 2022-03-14 DIAGNOSIS — Z0000 Encounter for general adult medical examination without abnormal findings: Secondary | ICD-10-CM | POA: Diagnosis not present

## 2022-03-14 DIAGNOSIS — R110 Nausea: Secondary | ICD-10-CM

## 2022-03-14 MED ORDER — ONDANSETRON 4 MG PO TBDP: 4.0000 mg | ORAL_TABLET | Freq: Four times a day (QID) | ORAL | 0 refills | Status: DC | PRN

## 2022-03-14 NOTE — Patient Instructions (Signed)
  Kayla Ryan , Thank you for taking time to come for your Medicare Wellness Visit. I appreciate your ongoing commitment to your health goals. Please review the following plan we discussed and let me know if I can assist you in the future.   These are the goals we discussed:  Goals      Patient Stated     Lose weight.        This is a list of the screening recommended for you and due dates:  Health Maintenance  Topic Date Due   Eye exam for diabetics  Never done   Urine Protein Check  Never done   Hepatitis C Screening: USPSTF Recommendation to screen - Ages 34-79 yo.  Never done   Zoster (Shingles) Vaccine (1 of 2) Never done   Mammogram  02/18/2018   DEXA scan (bone density measurement)  10/30/2018   COVID-19 Vaccine (3 - Pfizer risk series) 12/18/2019   Flu Shot  06/06/2022*   Hemoglobin A1C  08/05/2022   Complete foot exam   03/04/2023   Tetanus Vaccine  10/30/2029   Colon Cancer Screening  05/01/2031   Pneumonia Vaccine  Completed   HPV Vaccine  Aged Out  *Topic was postponed. The date shown is not the original due date.

## 2022-03-14 NOTE — Progress Notes (Signed)
Subjective:   Kayla Ryan is a 66 y.o. female who presents for Medicare Annual (Subsequent) preventive examination.  Review of Systems    I connected with  Kayla Ryan on 03/14/22 by a audio enabled telemedicine application and verified that I am speaking with the correct person using two identifiers.  Patient Location: Home  Provider Location: Office/Clinic  I discussed the limitations of evaluation and management by telemedicine. The patient expressed understanding and agreed to proceed.        Objective:    There were no vitals filed for this visit. There is no height or weight on file to calculate BMI.     03/09/2022    2:21 PM 03/03/2022    3:39 PM 02/25/2022    4:34 PM 02/25/2022    9:47 AM 06/23/2021    3:01 PM 04/30/2021    4:06 PM 04/29/2021    9:25 PM  Advanced Directives  Does Patient Have a Medical Advance Directive? _0  No   Would patient like information on creating a medical advance directive? No - Patient declined No - Patient declined  No - Patient declined No - Patient declined No - Patient declined No - Patient declined    Current Medications (verified) Outpatient Encounter Medications as of 03/14/2022  Medication Sig   acyclovir (ZOVIRAX) 400 MG tablet Take 400 mg by mouth every evening.   blood glucose meter kit and supplies Dispense based on patient and insurance preference. Once daily testing DX E11.9   cetirizine (ZYRTEC) 10 MG tablet Take 10 mg by mouth daily.   Continuous Blood Gluc Receiver ([place]) DEVI To check blood glucose PRN   Continuous Blood Gluc Sensor (DEXCOM G7 SENSOR) MISC Check blood glucose at least 3 times before meals and at bedtime, and as needed.   dicyclomine (BENTYL) 10 MG capsule Take 1 capsule (10 mg total) by mouth 4 (four) times daily -  before meals and at bedtime. Monitor for constipation, dry mouth, dizziness   doxepin (SINEQUAN) 10 MG capsule Take 10 mg by mouth at bedtime.   DULoxetine  (CYMBALTA) 60 MG capsule Take 1 capsule (60 mg total) by mouth 2 (two) times daily. (Patient taking differently: Take 60 mg by mouth every evening.)   FARXIGA 10 MG TABS tablet Take 1 tablet (10 mg total) by mouth daily.   gabapentin (NEURONTIN) 800 MG tablet Take 800 mg by mouth 3 (three) times daily.   gemfibrozil (LOPID) 600 MG tablet Take 600 mg by mouth 2 (two) times daily before a meal.   HYDROcodone-acetaminophen (NORCO) 10-325 MG tablet Take 1 tablet by mouth every 4 (four) hours as needed.   hydrocortisone (ANUSOL-HC) 2.5 % rectal cream Place 1 Application rectally 2 (two) times daily.   ipratropium (ATROVENT HFA) 17 MCG/ACT inhaler Inhale 2 puffs into the lungs every 6 (six) hours.   isosorbide dinitrate (ISORDIL) 30 MG tablet Take 1 tablet (30 mg total) by mouth daily.   LORazepam (ATIVAN) 0.5 MG tablet Take 1 tablet (0.5 mg total) by mouth 3 (three) times daily as needed for anxiety.   metFORMIN (GLUCOPHAGE) 500 MG tablet Take 1 tablet (500 mg total) by mouth 2 (two) times daily with a meal.   montelukast (SINGULAIR) 10 MG tablet Take 10 mg by mouth daily.   NITROSTAT 0.4 MG SL tablet Place 0.4 mg under the tongue every 15 (fifteen) minutes as needed for chest pain.    omeprazole (PRILOSEC) 40 MG capsule Take 40  mg by mouth 2 (two) times daily.   ondansetron (ZOFRAN-ODT) 4 MG disintegrating tablet Take 1 tablet (4 mg total) by mouth every 6 (six) hours as needed for nausea.   simethicone (GAS-X) 80 MG chewable tablet Chew 1 tablet (80 mg total) by mouth every 6 (six) hours as needed for flatulence.   Tiotropium Bromide-Olodaterol (STIOLTO RESPIMAT) 2.5-2.5 MCG/ACT AERS Inhale 2 each into the lungs daily.   tirzepatide [facility]) 2.5 MG/0.5ML Pen Inject 2.5 mg into the skin once a week.   No facility-administered encounter medications on file as of 03/14/2022.    Allergies (verified) Sulfa antibiotics, Dilaudid [hydromorphone hcl], Iron, Metronidazole, Prednisone, Statins, Cephalexin,  Methotrexate derivatives, and Morphine and related   History: Past Medical History:  Diagnosis Date   Anemia    Arthritis    Asthma    Chest pain    Chronic bronchitis    Congestive heart failure (CHF) (HCC)    COPD (chronic obstructive pulmonary disease) ([place])    Crohn disease ([place])    Emphysema    Emphysema of lung (HCC)    Fibromyalgia    Herpes    Hyperlipidemia    IBS (irritable bowel syndrome)    Kidney failure    Migraines    Muscular dystrophy ([place])    Neck pain    Plantar fasciitis    Polymyositis ([place])    Scoliosis    Past Surgical History:  Procedure Laterality Date   ABDOMINAL HYSTERECTOMY     AGILE CAPSULE N/A 06/24/2021   Procedure: AGILE CAPSULE;  Surgeon: Eloise Harman, DO;  [facility];  Service: Endoscopy;  Laterality: N/A;   AGILE CAPSULE N/A 07/22/2021   Procedure: AGILE CAPSULE;  Surgeon: Daneil Dolin, MD;  [facility];  Service: Endoscopy;  Laterality: N/A;  7:30am   bone spur     CHOLECYSTECTOMY     COLONOSCOPY WITH PROPOFOL N/A 04/30/2021   Procedure: COLONOSCOPY WITH PROPOFOL;  Surgeon: Daneil Dolin, MD;  [facility];  Service: Endoscopy;  Laterality: N/A;   ESOPHAGEAL DILATION N/A 04/30/2021   Procedure: ESOPHAGEAL DILATION;  Surgeon: Daneil Dolin, MD;  [facility];  Service: Endoscopy;  Laterality: N/A;   ESOPHAGOGASTRODUODENOSCOPY (EGD) WITH PROPOFOL N/A 04/30/2021   Procedure: ESOPHAGOGASTRODUODENOSCOPY (EGD) WITH PROPOFOL;  Surgeon: Daneil Dolin, MD;  [facility];  Service: Endoscopy;  Laterality: N/A;   GIVENS CAPSULE STUDY N/A 09/22/2021   Procedure: GIVENS CAPSULE STUDY;  Surgeon: Daneil Dolin, MD;  [facility];  Service: Endoscopy;  Laterality: N/A;  7:30am   HERNIA REPAIR     LEFT HEART CATHETERIZATION WITH CORONARY ANGIOGRAM N/A 11/07/2013   Procedure: LEFT HEART CATHETERIZATION WITH CORONARY ANGIOGRAM;  Surgeon: Lorretta Harp, MD;  Location: [facility]  CATH LAB;  Service: Cardiovascular;  Laterality: N/A;   MASTECTOMY PARTIAL / LUMPECTOMY     OOPHORECTOMY     ROTATOR CUFF REPAIR     Family History  Problem Relation Age of Onset   Lung cancer Mother    COPD Father    Lung cancer Father    Lymphoma Father    Anxiety disorder Sister    Depression Brother    Social History   Socioeconomic History   Marital status: Widowed    Spouse name: Not on file   Number of children: Not on file   Years of education: Not on file   Highest education level: Not on file  Occupational History  Not on file  Tobacco Use   Smoking status: Former    Packs/day: 1.00    Years: 49.00    Total pack years: 49.00    Types: Cigarettes    Start date: 08/08/1966    Quit date: 08/31/2005    Years since quitting: 16.5   Smokeless tobacco: Never  Vaping Use   Vaping Use: Never used  Substance and Sexual Activity   Alcohol use: No   Drug use: No    Comment: used marijuana in teens   Sexual activity: Not Currently    Birth control/protection: Surgical  Other Topics Concern   Not on file  Social History Narrative   Not on file   Social Determinants of Health   Financial Resource Strain: Not on file  Food Insecurity: Not on file  Transportation Needs: Not on file  Physical Activity: Not on file  Stress: Not on file  Social Connections: Not on file    Tobacco Counseling Counseling given: Not Answered   Clinical Intake:  Ms. Brundidge , Thank you for taking time to come for your Medicare Wellness Visit. I appreciate your ongoing commitment to your health goals. Please review the following plan we discussed and let me know if I can assist you in the future.   These are the goals we discussed:  Goals   None     This is a list of the screening recommended for you and due dates:  Health Maintenance  Topic Date Due   Eye exam for diabetics  Never done   Urine Protein Check  Never done   Hepatitis C Screening: USPSTF Recommendation to screen  - Ages 24-79 yo.  Never done   Zoster (Shingles) Vaccine (1 of 2) Never done   Mammogram  02/18/2018   DEXA scan (bone density measurement)  10/30/2018   COVID-19 Vaccine (3 - Pfizer risk series) 12/18/2019   Flu Shot  06/06/2022*   Hemoglobin A1C  08/05/2022   Complete foot exam   03/04/2023   Tetanus Vaccine  10/30/2029   Colon Cancer Screening  05/01/2031   Pneumonia Vaccine  Completed   HPV Vaccine  Aged Out  *Topic was postponed. The date shown is not the original due date.                How often do you need to have someone help you when you read instructions, pamphlets, or other written materials from your doctor or pharmacy?: (P) 2 - Rarely  Diabetic?YES Nutrition Risk Assessment:  Has the patient had any N/V/D within the last 2 months?  Yes  Does the patient have any non-healing wounds?  No  Has the patient had any unintentional weight loss or weight gain?  No   Diabetes:  Is the patient diabetic?  Yes  If diabetic, was a CBG obtained today?  No  Did the patient bring in their glucometer from home?  No  How often do you monitor your CBG's? 3-4 times daily.   Financial Strains and Diabetes Management:  Are you having any financial strains with the device, your supplies or your medication? No .  Does the patient want to be seen by Chronic Care Management for management of their diabetes?  No  Would the patient like to be referred to a Nutritionist or for Diabetic Management?  No   Diabetic Exams:  Diabetic Eye Exam: Overdue for diabetic eye exam. Pt has been advised about the importance in completing this exam. Patient advised to call  and schedule an eye exam. Diabetic Foot Exam: Completed 03/03/22           Activities of Daily Living    03/13/2022    6:44 PM 06/23/2021    9:11 PM  In your present state of health, do you have any difficulty performing the following activities:  Hearing? 0   Vision? 1   Difficulty concentrating or making decisions? 1    Walking or climbing stairs? 1   Dressing or bathing? 1   Doing errands, shopping? 1 0  Preparing Food and eating ? N   Using the Toilet? N   In the past six months, have you accidently leaked urine? Y   Do you have problems with loss of bowel control? N   Managing your Medications? N   Managing your Finances? N   Housekeeping or managing your Housekeeping? Y     Patient Care Team: Lindell Spar, MD as PCP - General (Internal Medicine) Derek Jack, MD as Medical Oncologist (Hematology)  Indicate any recent Medical Services you may have received from other than [facility] providers in the past year (date may be approximate).     Assessment:   This is a routine wellness examination for [place].  Hearing/Vision screen No results found.  Dietary issues and exercise activities discussed:     Goals Addressed   None   Depression Screen    03/03/2022   10:30 AM 09/28/2020    1:15 PM 09/14/2020    1:25 PM  PHQ 2/9 Scores  PHQ - 2 Score 0    PHQ- 9 Score        Information is confidential and restricted. Go to Review Flowsheets to unlock data.     Fall Risk    03/13/2022    6:44 PM 03/03/2022   10:30 AM  Fall Risk   Falls in the past year? 1 0  Number falls in past yr: 0 0  Injury with Fall? 1 0  Risk for fall due to :  No Fall Risks  Follow up  Falls evaluation completed    [place]:  Any stairs in or around the home? No  If so, are there any without handrails?  N/a Home free of loose throw rugs in walkways, pet beds, electrical cords, etc? Yes  Adequate lighting in your home to reduce risk of falls? Yes   ASSISTIVE DEVICES UTILIZED TO PREVENT FALLS:  Life alert? No  Use of a cane, walker or w/c? Yes  Grab bars in the bathroom? Yes  Shower chair or bench in shower? Yes  Elevated toilet seat or a handicapped toilet? NO  Immunizations Immunization History  Administered Date(s) Administered   Fluad Quad(high Dose 65+)  05/27/2020   Influenza-Unspecified 06/17/2016, 05/25/2021   PFIZER(Purple Top)SARS-COV-2 Vaccination 10/02/2019, 11/20/2019   PNEUMOCOCCAL CONJUGATE-20 09/15/2021   Tdap 10/31/2019    TDAP status: Up to date  Flu Vaccine status: Up to date  Pneumococcal vaccine status: Up to date  Covid-19 vaccine status: Completed vaccines  Qualifies for Shingles Vaccine? Yes   Zostavax completed No   Shingrix Completed?: No.    Education has been provided regarding the importance of this vaccine. Patient has been advised to call insurance company to determine out of pocket expense if they have not yet received this vaccine. Advised may also receive vaccine at local pharmacy or Health Dept. Verbalized acceptance and understanding.  Screening Tests Health Maintenance  Topic Date Due   OPHTHALMOLOGY EXAM  Never done   URINE MICROALBUMIN  Never done   Hepatitis C Screening  Never done   Zoster Vaccines- Shingrix (1 of 2) Never done   MAMMOGRAM  02/18/2018   DEXA SCAN  10/30/2018   COVID-19 Vaccine (3 - Pfizer risk series) 12/18/2019   INFLUENZA VACCINE  06/06/2022 (Originally 03/08/2022)   HEMOGLOBIN A1C  08/05/2022   FOOT EXAM  03/04/2023   TETANUS/TDAP  10/30/2029   COLONOSCOPY (Pts 45-32yr Insurance coverage will need to be confirmed)  05/01/2031   Pneumonia Vaccine 66 Years old  Completed   HPV VACCINES  Aged Out    Health Maintenance  Health Maintenance Due  Topic Date Due   OPHTHALMOLOGY EXAM  Never done   URINE MICROALBUMIN  Never done   Hepatitis C Screening  Never done   Zoster Vaccines- Shingrix (1 of 2) Never done   MAMMOGRAM  02/18/2018   DEXA SCAN  10/30/2018   COVID-19 Vaccine (3 - Pfizer risk series) 12/18/2019    Colorectal cancer screening: Type of screening: Colonoscopy. Completed 04/30/21. Repeat every 10 years   Bone Density status: Ordered  . Pt provided with contact info and advised to call to schedule appt.  Lung Cancer Screening: (Low Dose CT Chest  recommended if Age 68-80years, 30 pack-year currently smoking OR have quit w/in 15years.) does qualify.   Lung Cancer Screening Referral: YES  Additional Screening:  Hepatitis C Screening: does qualify; Completed NO  Vision Screening: Recommended annual ophthalmology exams for early detection of glaucoma and other disorders of the eye. Is the patient up to date with their annual eye exam?  Yes  Who is the provider or what is the name of the office in which the patient attends annual eye exams? My eye doctor . If pt is not established with a provider, would they like to be referred to a provider to establish care? No .   Dental Screening: Recommended annual dental exams for proper oral hygiene  Community Resource Referral / Chronic Care Management: CRR required this visit?  No   CCM required this visit?  No      Plan:     I have personally reviewed and noted the following in the patient's chart:   Medical and social history Use of alcohol, tobacco or illicit drugs  Current medications and supplements including opioid prescriptions.  Functional ability and status Nutritional status Physical activity Advanced directives List of other physicians Hospitalizations, surgeries, and ER visits in previous 12 months Vitals Screenings to include cognitive, depression, and falls Referrals and appointments  In addition, I have reviewed and discussed with patient certain preventive protocols, quality metrics, and best practice recommendations. A written personalized care plan for preventive services as well as general preventive health recommendations were provided to patient.     KQuentin Angst [place]  03/14/2022

## 2022-03-15 ENCOUNTER — Ambulatory Visit: Payer: Medicare Other | Admitting: Adult Health

## 2022-03-16 ENCOUNTER — Ambulatory Visit: Payer: Medicare Other

## 2022-03-18 ENCOUNTER — Encounter (HOSPITAL_COMMUNITY): Payer: Self-pay | Admitting: Hematology

## 2022-03-22 ENCOUNTER — Inpatient Hospital Stay: Payer: Medicare Other

## 2022-03-26 ENCOUNTER — Other Ambulatory Visit: Payer: Self-pay | Admitting: Internal Medicine

## 2022-03-26 DIAGNOSIS — E1149 Type 2 diabetes mellitus with other diabetic neurological complication: Secondary | ICD-10-CM

## 2022-03-28 ENCOUNTER — Other Ambulatory Visit: Payer: Self-pay

## 2022-03-28 ENCOUNTER — Telehealth: Payer: Self-pay | Admitting: Internal Medicine

## 2022-03-28 DIAGNOSIS — E1149 Type 2 diabetes mellitus with other diabetic neurological complication: Secondary | ICD-10-CM

## 2022-03-28 MED ORDER — MOUNJARO 2.5 MG/0.5ML ~~LOC~~ SOAJ: SUBCUTANEOUS | 0 refills | Status: DC

## 2022-03-28 NOTE — Telephone Encounter (Signed)
Medication sent.

## 2022-03-28 NOTE — Telephone Encounter (Signed)
Patient needs refill on    MOUNJARO 2.5 MG/0.5ML Pen

## 2022-03-30 ENCOUNTER — Inpatient Hospital Stay: Payer: Medicare Other

## 2022-04-01 ENCOUNTER — Telehealth: Payer: Self-pay | Admitting: Internal Medicine

## 2022-04-01 NOTE — Telephone Encounter (Signed)
Pt called stating that she has rt sided neck pain & stiffness for 1 wk. She has a place on her ear that is getting bigger for over a month. States she is in pain. Wants to know what she can do?

## 2022-04-01 NOTE — Telephone Encounter (Signed)
Patient has appt on Monday 8-28 please keep that appt to discuss with provider if needs to come in before then can see any provider available

## 2022-04-04 ENCOUNTER — Encounter: Payer: Self-pay | Admitting: Internal Medicine

## 2022-04-04 ENCOUNTER — Ambulatory Visit (INDEPENDENT_AMBULATORY_CARE_PROVIDER_SITE_OTHER): Payer: Medicare Other | Admitting: Internal Medicine

## 2022-04-04 VITALS — BP 136/72 | HR 91 | Resp 18 | Ht 68.0 in

## 2022-04-04 DIAGNOSIS — R220 Localized swelling, mass and lump, head: Secondary | ICD-10-CM | POA: Diagnosis not present

## 2022-04-04 DIAGNOSIS — E1149 Type 2 diabetes mellitus with other diabetic neurological complication: Secondary | ICD-10-CM

## 2022-04-04 DIAGNOSIS — Z1231 Encounter for screening mammogram for malignant neoplasm of breast: Secondary | ICD-10-CM

## 2022-04-04 DIAGNOSIS — E041 Nontoxic single thyroid nodule: Secondary | ICD-10-CM | POA: Diagnosis not present

## 2022-04-04 DIAGNOSIS — R42 Dizziness and giddiness: Secondary | ICD-10-CM | POA: Diagnosis not present

## 2022-04-04 DIAGNOSIS — E039 Hypothyroidism, unspecified: Secondary | ICD-10-CM

## 2022-04-04 DIAGNOSIS — I10 Essential (primary) hypertension: Secondary | ICD-10-CM

## 2022-04-04 MED ORDER — MECLIZINE HCL 25 MG PO TABS: 25.0000 mg | ORAL_TABLET | Freq: Three times a day (TID) | ORAL | 0 refills | Status: DC | PRN

## 2022-04-04 MED ORDER — TIRZEPATIDE 5 MG/0.5ML ~~LOC~~ SOAJ: 5.0000 mg | SUBCUTANEOUS | 1 refills | Status: DC

## 2022-04-04 NOTE — Patient Instructions (Signed)
Please start taking Mounjaro 5 mg instead of 2.5 mg.  Please take Meclizine as needed for dizziness.  Please eat at regular intervals and maintain adequate hydration.  Continue to follow low carb diet and ambulate as tolerated.

## 2022-04-05 ENCOUNTER — Telehealth: Payer: Self-pay | Admitting: Internal Medicine

## 2022-04-05 NOTE — Telephone Encounter (Signed)
Kenney Houseman with [facility] health Radiology called in on patient recent orders.  Is wanting clarification on most recent CT orders on Thyroids     Call back info  [phone]

## 2022-04-05 NOTE — Telephone Encounter (Signed)
This number is central scheduling doesn't go directly to [place]. If she calls back please ask for extension otherwise I will schedule when able

## 2022-04-06 ENCOUNTER — Telehealth: Payer: Self-pay | Admitting: *Deleted

## 2022-04-06 ENCOUNTER — Inpatient Hospital Stay: Payer: Medicare Other

## 2022-04-06 ENCOUNTER — Other Ambulatory Visit: Payer: Self-pay | Admitting: *Deleted

## 2022-04-06 VITALS — BP 123/45 | HR 71 | Temp 98.0°F | Resp 18

## 2022-04-06 DIAGNOSIS — D509 Iron deficiency anemia, unspecified: Secondary | ICD-10-CM

## 2022-04-06 DIAGNOSIS — E041 Nontoxic single thyroid nodule: Secondary | ICD-10-CM

## 2022-04-06 MED ORDER — LORATADINE 10 MG PO TABS
10.0000 mg | ORAL_TABLET | Freq: Once | ORAL | Status: AC
  Administered 2022-04-06: 10 mg via ORAL
  Filled 2022-04-06: qty 1

## 2022-04-06 MED ORDER — FAMOTIDINE 20 MG PO TABS
20.0000 mg | ORAL_TABLET | Freq: Once | ORAL | Status: AC
  Administered 2022-04-06: 20 mg via ORAL
  Filled 2022-04-06: qty 1

## 2022-04-06 MED ORDER — SODIUM CHLORIDE 0.9 % IV SOLN: Freq: Once | INTRAVENOUS | Status: AC

## 2022-04-06 MED ORDER — ACETAMINOPHEN 325 MG PO TABS
650.0000 mg | ORAL_TABLET | Freq: Once | ORAL | Status: AC
  Administered 2022-04-06: 650 mg via ORAL
  Filled 2022-04-06: qty 2

## 2022-04-06 MED ORDER — SODIUM CHLORIDE 0.9 % IV SOLN
8.0000 mg | Freq: Once | INTRAVENOUS | Status: AC
  Administered 2022-04-06: 8 mg via INTRAVENOUS
  Filled 2022-04-06: qty 54

## 2022-04-06 MED ORDER — SODIUM CHLORIDE 0.9 % IV SOLN
100.0000 mg | Freq: Once | INTRAVENOUS | Status: AC
  Administered 2022-04-06: 100 mg via INTRAVENOUS
  Filled 2022-04-06: qty 5

## 2022-04-06 NOTE — Patient Instructions (Signed)
[facility]  Discharge Instructions: Thank you for choosing [facility] to provide your oncology and hematology care.  If you have a lab appointment with the Cancer Center, please come in thru the Main Entrance and check in at the main information desk.  Wear comfortable clothing and clothing appropriate for easy access to any Portacath or PICC line.   We strive to give you quality time with your provider. You may need to reschedule your appointment if you arrive late (15 or more minutes).  Arriving late affects you and other patients whose appointments are after yours.  Also, if you miss three or more appointments without notifying the office, you may be dismissed from the clinic at the provider's discretion.      For prescription refill requests, have your pharmacy contact our office and allow 72 hours for refills to be completed.    Today you received the following chemotherapy and/or immunotherapy agents Venofer      To help prevent nausea and vomiting after your treatment, we encourage you to take your nausea medication as directed.  BELOW ARE SYMPTOMS THAT SHOULD BE REPORTED IMMEDIATELY: *FEVER GREATER THAN 100.4 F (38 C) OR HIGHER *CHILLS OR SWEATING *NAUSEA AND VOMITING THAT IS NOT CONTROLLED WITH YOUR NAUSEA MEDICATION *UNUSUAL SHORTNESS OF BREATH *UNUSUAL BRUISING OR BLEEDING *URINARY PROBLEMS (pain or burning when urinating, or frequent urination) *BOWEL PROBLEMS (unusual diarrhea, constipation, pain near the anus) TENDERNESS IN MOUTH AND THROAT WITH OR WITHOUT PRESENCE OF ULCERS (sore throat, sores in mouth, or a toothache) UNUSUAL RASH, SWELLING OR PAIN  UNUSUAL VAGINAL DISCHARGE OR ITCHING   Items with * indicate a potential emergency and should be followed up as soon as possible or go to the Emergency Department if any problems should occur.  Please show the CHEMOTHERAPY ALERT CARD or IMMUNOTHERAPY ALERT CARD at check-in to the Emergency  Department and triage nurse.  Should you have questions after your visit or need to cancel or reschedule your appointment, please contact [facility] [phone]  and follow the prompts.  Office hours are 8:00 a.m. to 4:30 p.m. Monday - Friday. Please note that voicemails left after 4:00 p.m. may not be returned until the following business day.  We are closed weekends and major holidays. You have access to a nurse at all times for urgent questions. Please call the main number to the clinic [phone] and follow the prompts.  For any non-urgent questions, you may also contact your provider using MyChart. We now offer e-Visits for anyone 18 and older to request care online for non-urgent symptoms. For details visit [email or web address].   Also download the MyChart app! Go to the app store, search "MyChart", open the app, select [facility], and log in with your MyChart username and password.  Masks are optional in the cancer centers. If you would like for your care team to wear a mask while they are taking care of you, please let them know. You may have one support person who is at least 68 years old accompany you for your appointments.  

## 2022-04-06 NOTE — Telephone Encounter (Signed)
Patient still having neck pain and the pain medication is not touching it wants to know what she can do about this.

## 2022-04-06 NOTE — H&P (Signed)
  Patient: Kayla Ryan  PID: [id number]  DOB: 1956-07-23  SEX: Female   Patient referred by  DDS for extraction teeth 26, 27.  CC: On and off pain  Past Medical History:  Scoliosis, Polymyositis, Muscular dystrophy, Migraine, Kidney Failure, IBS, Hyperlipidemia, Fibromyalgia, Emphysema, Crohn's Disease, COPD, CHF, Chest Pain or Angina, Asthma, Pain Management    Medications: Acyclovir, Bentyl, Doxepin, Neurontin, Lopid, Atrovent, Ativan, Singulair, Nitrostat, Omeprazole, Zofran    Allergies:     Sulfa, Dilaudid, Iron, Flagyl, Prednisone, Cephalexin, Methotrexate, Morphine, Oxycodone    Surgeries:   Colonoscopy, Heart Catheterization, Hernia, TAH     Social History       Smoking: n          Alcohol:n Drug use:n                             Exam: BMI 26. Edentulous maxilla. Bilateral posterior edentulous mandible. Retained roots # 26, 27. Bilateral mandibular lingual tori. No purulence, edema, fluctuance, trismus. Oral cancer screening negative. Pharynx clear. No lymphadenopathy.  Panorex: Edentulous maxilla. Bilateral posterior edentulous mandible. Retained roots # 26, 27.  Assessment: ASA 3.  Retained roots # 26, 27. Bilateral mandibular lingual tori.              Plan:  1. MD clearance  2. Pulmonary clearance  3. Pain mgmt consult  4. Extraction Teeth #26, 27. Removal bilateral mandibular lingual tori.  Hospital Day surgery.                 Rx: n               Risks and complications explained. Questions answered.   Gae Bon, DMD

## 2022-04-06 NOTE — Progress Notes (Signed)
Patient presents today for Venofer infusion per providers order.  Vital signs WNL.  Patient has no new complaints at this time.  Peripheral IV started and blood return noted pre and post infusion.  Stable during infusion without adverse affects.  Vital signs stable.  No complaints at this time.  Discharge from clinic ambulatory in stable condition.  Alert and oriented X 3.  Follow up with [facility] as scheduled.

## 2022-04-06 NOTE — Telephone Encounter (Signed)
Patient advised with verbal understanding

## 2022-04-07 ENCOUNTER — Other Ambulatory Visit: Payer: Self-pay | Admitting: *Deleted

## 2022-04-07 ENCOUNTER — Encounter: Payer: Self-pay | Admitting: Internal Medicine

## 2022-04-07 ENCOUNTER — Telehealth: Payer: Self-pay | Admitting: *Deleted

## 2022-04-07 ENCOUNTER — Encounter: Payer: Self-pay | Admitting: *Deleted

## 2022-04-07 DIAGNOSIS — R42 Dizziness and giddiness: Secondary | ICD-10-CM | POA: Insufficient documentation

## 2022-04-07 DIAGNOSIS — E041 Nontoxic single thyroid nodule: Secondary | ICD-10-CM | POA: Insufficient documentation

## 2022-04-07 DIAGNOSIS — R220 Localized swelling, mass and lump, head: Secondary | ICD-10-CM | POA: Insufficient documentation

## 2022-04-07 DIAGNOSIS — D509 Iron deficiency anemia, unspecified: Secondary | ICD-10-CM

## 2022-04-07 HISTORY — DX: Nontoxic single thyroid nodule: E04.1

## 2022-04-07 NOTE — Telephone Encounter (Signed)
Patient called stating that she had severe diarrhea and vomiting following Venofer infusion yesterday afternoon.  Spoke with her this morning and she states doing better, however still experiencing diarrhea.  Advised that she could take imodium to help with the diarrhea. Dr. Delton Coombes made aware and requested that she have labs and a return visit with Tarri Abernethy PA-C in 2 weeks.  Reluctant to change therapy due to previous reactions with other iron components in the past, however will re-evaluate.  No severe reactions have been noted with Venofer, aside from the above intolerance, per patient and she would like to continue to receive in the future, despite the side effects.

## 2022-04-07 NOTE — Progress Notes (Signed)
Established Patient Office Visit  Subjective:  Patient ID: Kayla Ryan, female    DOB: [birth year]  Age: 66 y.o. MRN: [id number]  CC:  Chief Complaint  Patient presents with   Follow-up    1 month follow up DM she feels dizzy light headed and vomiting and nausea since 03-30-22 unbalanced today and very weak     HPI Kayla Ryan is a 66 y.o. female with past medical history of HTN, HFpEF, COPD, GERD, type II DM with neuropathy, CKD, GAD and chronic pain syndrome who presents for f/u of her chronic medical conditions.  She complains of dizziness with nausea and vomiting for the last 4 to 5 days.  She also feels unsteady while walking.  Her blood glucose has been improving now, mostly between 150-200.  Denies any episode of hypoglycemia.  Type II DM: She has started using Mounjaro, and has been tolerating it well.  Her blood glucose has been improving now.  She has a CGM.  Denies any recent episode of hypoglycemia. She is taking [place] for CKD.  She has chronic numbness of bilateral foot.  She takes gabapentin 800 mg daily currently.  Denies any polyuria or polyphagia.  She has chronic fatigue.  GAD: She takes Ativan 0.5 mg 3 times daily as needed. She is also on Cymbalta.   She has history of COPD and chronic hypoxic respiratory failure.  She has acute Stiolto and as needed albuterol.  She uses 3 LPM O2 at home.   She reports noticing of mass behind the right ear, which has been present for many years.  It has been growing recently, and she also complains of mild pain in that area.  Denies any ear pain or discharge currently.  She has history of thyroid nodule, for which she had ultrasound guided biopsy in 05/23, which was insufficient. She had to get repeat biopsy, but was not able to see her previous PCP after that.   Past Medical History:  Diagnosis Date   Allergy Unknown   Anemia    Anxiety 1883   Arthritis    Asthma    Blood transfusion without reported diagnosis 2012    Chest pain    Chronic bronchitis    Clotting disorder ([place]) 2015   Congestive heart failure (CHF) (HCC)    COPD (chronic obstructive pulmonary disease) (HCC)    Crohn disease ([place])    Diabetes mellitus without complication ([place]) 9/44/9675   Emphysema    Emphysema of lung (HCC)    Fibromyalgia    GERD (gastroesophageal reflux disease) 2980   Herpes    Hyperlipidemia    Hypertension 2015   IBS (irritable bowel syndrome)    Kidney failure    Migraines    Muscular dystrophy ([place])    Neck pain    Oxygen deficiency 2022   Plantar fasciitis    Polymyositis ([place])    Right thyroid nodule 04/07/2022   Scoliosis     Past Surgical History:  Procedure Laterality Date   ABDOMINAL HYSTERECTOMY     AGILE CAPSULE N/A 06/24/2021   Procedure: AGILE CAPSULE;  Surgeon: Eloise Harman, DO;  [facility];  Service: Endoscopy;  Laterality: N/A;   AGILE CAPSULE N/A 07/22/2021   Procedure: AGILE CAPSULE;  Surgeon: Daneil Dolin, MD;  [facility];  Service: Endoscopy;  Laterality: N/A;  7:30am   bone spur     BREAST SURGERY  1995   CHOLECYSTECTOMY     COLONOSCOPY WITH  PROPOFOL N/A 04/30/2021   Procedure: COLONOSCOPY WITH PROPOFOL;  Surgeon: Daneil Dolin, MD;  [facility];  Service: Endoscopy;  Laterality: N/A;   ESOPHAGEAL DILATION N/A 04/30/2021   Procedure: ESOPHAGEAL DILATION;  Surgeon: Daneil Dolin, MD;  [facility];  Service: Endoscopy;  Laterality: N/A;   ESOPHAGOGASTRODUODENOSCOPY (EGD) WITH PROPOFOL N/A 04/30/2021   Procedure: ESOPHAGOGASTRODUODENOSCOPY (EGD) WITH PROPOFOL;  Surgeon: Daneil Dolin, MD;  [facility];  Service: Endoscopy;  Laterality: N/A;   GIVENS CAPSULE STUDY N/A 09/22/2021   Procedure: GIVENS CAPSULE STUDY;  Surgeon: Daneil Dolin, MD;  [facility];  Service: Endoscopy;  Laterality: N/A;  7:30am   HERNIA REPAIR     LEFT HEART CATHETERIZATION WITH CORONARY ANGIOGRAM N/A 11/07/2013    Procedure: LEFT HEART CATHETERIZATION WITH CORONARY ANGIOGRAM;  Surgeon: Lorretta Harp, MD;  Location: [facility] CATH LAB;  Service: Cardiovascular;  Laterality: N/A;   MASTECTOMY PARTIAL / LUMPECTOMY     OOPHORECTOMY     ROTATOR CUFF REPAIR      Family History  Problem Relation Age of Onset   Lung cancer Mother    Cancer Mother    Miscarriages / [place] Mother    Varicose Veins Mother    COPD Father    Lung cancer Father    Lymphoma Father    Alcohol abuse Father    Arthritis Father    Anxiety disorder Sister    Depression Brother    Anxiety disorder Sister     Social History   Socioeconomic History   Marital status: Widowed    Spouse name: Not on file   Number of children: Not on file   Years of education: Not on file   Highest education level: Not on file  Occupational History   Not on file  Tobacco Use   Smoking status: Former    Packs/day: 1.00    Years: 49.00    Total pack years: 49.00    Types: Cigarettes    Start date: 08/08/1966    Quit date: 08/31/2005    Years since quitting: 16.6   Smokeless tobacco: Never  Vaping Use   Vaping Use: Never used  Substance and Sexual Activity   Alcohol use: No   Drug use: No    Comment: used marijuana in teens   Sexual activity: Not Currently    Birth control/protection: Surgical  Other Topics Concern   Not on file  Social History Narrative   Not on file   Social Determinants of Health   Financial Resource Strain: Low Risk  (03/14/2022)   Overall Financial Resource Strain (CARDIA)    Difficulty of Paying Living Expenses: Not hard at all  Food Insecurity: No Food Insecurity (03/14/2022)   Hunger Vital Sign    Worried About Running Out of Food in the Last Year: Never true    [place] in the Last Year: Never true  Transportation Needs: No Transportation Needs (03/14/2022)   PRAPARE - Hydrologist (Medical): No    Lack of Transportation (Non-Medical): No  Physical Activity: Inactive  (03/14/2022)   Exercise Vital Sign    Days of Exercise per Week: 0 days    Minutes of Exercise per Session: 0 min  Stress: No Stress Concern Present (03/14/2022)   [place]    Feeling of Stress : Not at all  Social Connections: Socially Isolated (03/14/2022)   Social  Connection and Isolation Panel [NHANES]    Frequency of Communication with Friends and Family: More than three times a week    Frequency of Social Gatherings with Friends and Family: Twice a week    Attends Religious Services: Never    Marine scientist or Organizations: No    Attends Archivist Meetings: Never    Marital Status: Widowed  Intimate Partner Violence: Not At Risk (03/14/2022)   Humiliation, Afraid, Rape, and Kick questionnaire    Fear of Current or Ex-Partner: No    Emotionally Abused: No    Physically Abused: No    Sexually Abused: No    Outpatient Medications Prior to Visit  Medication Sig Dispense Refill   acyclovir (ZOVIRAX) 400 MG tablet Take 400 mg by mouth every evening.     blood glucose meter kit and supplies Dispense based on patient and insurance preference. Once daily testing DX E11.9 1 each 0   cetirizine (ZYRTEC) 10 MG tablet Take 10 mg by mouth daily.     Continuous Blood Gluc Receiver (DEXCOM G7 RECEIVER) DEVI To check blood glucose PRN 1 each 0   Continuous Blood Gluc Sensor (DEXCOM G7 SENSOR) MISC Check blood glucose at least 3 times before meals and at bedtime, and as needed. 3 each 5   dicyclomine (BENTYL) 10 MG capsule Take 1 capsule (10 mg total) by mouth 4 (four) times daily -  before meals and at bedtime. Monitor for constipation, dry mouth, dizziness 120 capsule 3   doxepin (SINEQUAN) 10 MG capsule Take 10 mg by mouth at bedtime.     DULoxetine (CYMBALTA) 60 MG capsule Take 1 capsule (60 mg total) by mouth 2 (two) times daily. (Patient taking differently: Take 60 mg by mouth every evening.) 60 capsule 3    FARXIGA 10 MG TABS tablet Take 1 tablet (10 mg total) by mouth daily. 30 tablet 5   gabapentin (NEURONTIN) 800 MG tablet Take 800 mg by mouth 3 (three) times daily.     gemfibrozil (LOPID) 600 MG tablet Take 600 mg by mouth 2 (two) times daily before a meal.     HYDROcodone-acetaminophen (NORCO) 10-325 MG tablet Take 1 tablet by mouth every 4 (four) hours as needed.     hydrocortisone (ANUSOL-HC) 2.5 % rectal cream Place 1 Application rectally 2 (two) times daily. 30 g 1   ipratropium (ATROVENT HFA) 17 MCG/ACT inhaler Inhale 2 puffs into the lungs every 6 (six) hours.     isosorbide dinitrate (ISORDIL) 30 MG tablet Take 1 tablet (30 mg total) by mouth daily. 90 tablet 1   LORazepam (ATIVAN) 0.5 MG tablet Take 1 tablet (0.5 mg total) by mouth 3 (three) times daily as needed for anxiety. 90 tablet 2   metFORMIN (GLUCOPHAGE) 500 MG tablet Take 1 tablet (500 mg total) by mouth 2 (two) times daily with a meal. 60 tablet 5   montelukast (SINGULAIR) 10 MG tablet Take 10 mg by mouth daily.     NITROSTAT 0.4 MG SL tablet Place 0.4 mg under the tongue every 15 (fifteen) minutes as needed for chest pain.      omeprazole (PRILOSEC) 40 MG capsule Take 40 mg by mouth 2 (two) times daily.     ondansetron (ZOFRAN-ODT) 4 MG disintegrating tablet Take 1 tablet (4 mg total) by mouth every 6 (six) hours as needed for nausea. 20 tablet 0   simethicone (GAS-X) 80 MG chewable tablet Chew 1 tablet (80 mg total) by mouth every 6 (six) hours  as needed for flatulence. 30 tablet 0   Tiotropium Bromide-Olodaterol (STIOLTO RESPIMAT) 2.5-2.5 MCG/ACT AERS Inhale 2 each into the lungs daily.     tirzepatide (MOUNJARO) 2.5 MG/0.5ML Pen ADMINISTER 2.5MG UNDER THE SKIN 1 TIME A WEEK 2 mL 0   No facility-administered medications prior to visit.    Allergies  Allergen Reactions   Sulfa Antibiotics Shortness Of Breath, Swelling and Rash   Dilaudid [Hydromorphone Hcl]     Makes me crazy   Iron     Throat starts closing up, tongue  swells, severe headaches and backpain   Metronidazole Diarrhea and Nausea And Vomiting   Prednisone Other (See Comments)    angry angry    Statins     Other reaction(s): Other (See Comments) Unknown    Cephalexin Diarrhea and Nausea And Vomiting   Methotrexate Derivatives Swelling and Rash   Morphine And Related Anxiety    "exreme mood swings"    ROS Review of Systems  Constitutional:  Positive for fatigue. Negative for chills and fever.  HENT:  Negative for congestion, sinus pressure, sinus pain and sore throat.   Eyes:  Negative for pain and discharge.  Respiratory:  Positive for shortness of breath. Negative for cough.   Cardiovascular:  Negative for chest pain and palpitations.  Gastrointestinal:  Positive for constipation. Negative for abdominal pain, nausea and vomiting.  Endocrine: Negative for polydipsia and polyuria.  Genitourinary:  Negative for dysuria and hematuria.  Musculoskeletal:  Positive for arthralgias, back pain and gait problem. Negative for neck pain and neck stiffness.  Skin:  Negative for rash.  Neurological:  Negative for dizziness and weakness.  Psychiatric/Behavioral:  Positive for sleep disturbance. Negative for agitation and behavioral problems. The patient is nervous/anxious.       Objective:    Physical Exam Vitals reviewed.  Constitutional:      General: She is not in acute distress.    Appearance: She is not diaphoretic.  HENT:     Head: Normocephalic and atraumatic.     Nose: Nose normal.     Mouth/Throat:     Mouth: Mucous membranes are moist.  Eyes:     General: No scleral icterus.    Extraocular Movements: Extraocular movements intact.  Neck:      Comments: Right sided postauricular mass, about 2 cm in diameter, mobile Cardiovascular:     Rate and Rhythm: Normal rate and regular rhythm.     Pulses: Normal pulses.     Heart sounds: Normal heart sounds. No murmur heard. Pulmonary:     Breath sounds: Normal breath sounds. No  wheezing or rales.     Comments: On 3 LPM O2 Musculoskeletal:     Cervical back: Neck supple. No tenderness.     Right lower leg: No edema.     Left lower leg: No edema.  Skin:    General: Skin is warm.     Findings: No rash.  Neurological:     General: No focal deficit present.     Mental Status: She is alert and oriented to person, place, and time.     Sensory: Sensory deficit present.     Motor: Weakness (4/5 in b/l UE and LE) present.  Psychiatric:        Mood and Affect: Mood normal.        Behavior: Behavior normal.     BP 136/72 (BP Location: Right Arm, Patient Position: Sitting, Cuff Size: Normal)   Pulse 91   Resp 18  Ht 5' 8"  (1.727 m)   SpO2 97%   BMI 27.06 kg/m  Wt Readings from Last 3 Encounters:  03/10/22 178 lb (80.7 kg)  03/03/22 176 lb 12.8 oz (80.2 kg)  02/25/22 175 lb (79.4 kg)    Lab Results  Component Value Date   TSH 3.918 05/01/2021   Lab Results  Component Value Date   WBC 7.8 02/25/2022   HGB 7.7 (L) 02/25/2022   HCT 27.0 (L) 02/25/2022   MCV 76.9 (L) 02/25/2022   PLT 412 (H) 02/25/2022   Lab Results  Component Value Date   NA 129 (L) 02/25/2022   K 4.7 02/25/2022   CO2 28 02/25/2022   GLUCOSE 511 (HH) 02/25/2022   BUN 21 02/25/2022   CREATININE 1.23 (H) 02/25/2022   BILITOT 0.5 06/23/2021   ALKPHOS 96 06/23/2021   AST 25 06/23/2021   ALT 14 06/23/2021   PROT 7.7 06/23/2021   ALBUMIN 4.1 06/23/2021   CALCIUM 9.0 02/25/2022   ANIONGAP 8 02/25/2022   GFR 58.40 (L) 01/28/2021   No results found for: "CHOL" No results found for: "HDL" No results found for: "LDLCALC" No results found for: "TRIG" No results found for: "CHOLHDL" Lab Results  Component Value Date   HGBA1C 10.9 02/03/2022      Assessment & Plan:   Problem List Items Addressed This Visit       Cardiovascular and Mediastinum   Essential hypertension    BP Readings from Last 1 Encounters:  04/06/22 (!) 123/45  Well-controlled with Imdur (for  CAD/HFpEF) Counseled for compliance with the medications Advised DASH diet and moderate exercise/walking as tolerated        Endocrine   Type 2 diabetes mellitus with neurological complications ([place]) - Primary    Lab Results  Component Value Date   HGBA1C 10.9 02/03/2022   On Farxiga and Metformin Uncontrolled, but improving Had added Mounjaro, increased dose to 5 mg qw Has had episodes of hypoglycemia in the past, has CGM now, no recent episode of hypoglycemia Advised to follow diabetic diet Has statin intolerance F/u CMP and lipid panel Diabetic eye exam: Advised to follow up with Ophthalmology for diabetic eye exam  Takes gabapentin 800 mg QD, advised to take it twice daily as she has persistent numbness and tingling of bilateral LE      Relevant Medications   tirzepatide [facility]) 5 MG/0.5ML Pen   Right thyroid nodule    Last biopsy in 05/23 was indeterminate due to insufficient sample Needs repeat biopsy      Relevant Orders   [place] FNA BX THYROID 1ST LESION AFIRMA     Other   Vertigo    Her dizziness and nausea likely due to vertigo Meclizine PRN for now Advised to avoid sudden positional changes      Relevant Medications   meclizine (ANTIVERT) 25 MG tablet   Mass of postauricular area    Check [place] head/neck soft tissue      Relevant Orders   US Soft Tissue Head/Neck (NON-THYROID)   Other Visit Diagnoses     Screening mammogram for breast cancer       Relevant Orders   MM 3D SCREEN BREAST BILATERAL       Meds ordered this encounter  Medications   tirzepatide (MOUNJARO) 5 MG/0.5ML Pen    Sig: Inject 5 mg into the skin once a week.    Dispense:  6 mL    Refill:  1   meclizine (ANTIVERT) 25 MG tablet  Sig: Take 1 tablet (25 mg total) by mouth 3 (three) times daily as needed for dizziness.    Dispense:  30 tablet    Refill:  0    Follow-up: Return in about 3 months (around 07/05/2022) for DM.    Lindell Spar, MD

## 2022-04-07 NOTE — Assessment & Plan Note (Signed)
Check [place] head/neck soft tissue

## 2022-04-07 NOTE — Assessment & Plan Note (Signed)
BP Readings from Last 1 Encounters:  04/06/22 (!) 123/45   Well-controlled with Imdur (for CAD/HFpEF) Counseled for compliance with the medications Advised DASH diet and moderate exercise/walking as tolerated

## 2022-04-07 NOTE — Assessment & Plan Note (Signed)
Last biopsy in 05/23 was indeterminate due to insufficient sample Needs repeat biopsy

## 2022-04-07 NOTE — Assessment & Plan Note (Addendum)
Her dizziness and nausea likely due to vertigo Meclizine PRN for now Advised to avoid sudden positional changes

## 2022-04-07 NOTE — Telephone Encounter (Signed)
Patient called back to advise that she was feeling better this morning, however has developed severe low back pain, nausea, vomiting and diarrhea.  She was encouraged to seek medical attention at Urgent Care or the ER.  States that she is not going to go at this point and wants to "ride it out".  Discouraged her from waiting and she said if she did not feel better in the morning she would go.

## 2022-04-07 NOTE — Assessment & Plan Note (Signed)
Lab Results  Component Value Date   HGBA1C 10.9 02/03/2022    On Farxiga and Metformin Uncontrolled, but improving Had added Mounjaro, increased dose to 5 mg qw Has had episodes of hypoglycemia in the past, has CGM now, no recent episode of hypoglycemia Advised to follow diabetic diet Has statin intolerance F/u CMP and lipid panel Diabetic eye exam: Advised to follow up with Ophthalmology for diabetic eye exam  Takes gabapentin 800 mg QD, advised to take it twice daily as she has persistent numbness and tingling of bilateral LE

## 2022-04-08 ENCOUNTER — Other Ambulatory Visit (HOSPITAL_COMMUNITY): Payer: Medicare Other

## 2022-04-08 ENCOUNTER — Inpatient Hospital Stay: Payer: Medicare Other

## 2022-04-12 NOTE — H&P (Signed)
HISTORY AND PHYSICAL  Kayla Ryan is a 66 y.o. female patient with CC: bad teeth. Wants partial denture.  No diagnosis found.  Past Medical History:  Diagnosis Date   Allergy Unknown   Anemia    Anxiety 1883   Arthritis    Asthma    Blood transfusion without reported diagnosis 2012   Chest pain    Chronic bronchitis    Clotting disorder ([place]) 2015   Congestive heart failure (CHF) (HCC)    COPD (chronic obstructive pulmonary disease) (HCC)    Crohn disease ([place])    Diabetes mellitus without complication ([place]) 3/73/4287   Emphysema    Emphysema of lung (HCC)    Fibromyalgia    GERD (gastroesophageal reflux disease) 2980   Herpes    Hyperlipidemia    Hypertension 2015   IBS (irritable bowel syndrome)    Kidney failure    Migraines    Muscular dystrophy ([place])    Neck pain    Oxygen deficiency 2022   Plantar fasciitis    Polymyositis ([place])    Right thyroid nodule 04/07/2022   Scoliosis     No current facility-administered medications for this encounter.   Current Outpatient Medications  Medication Sig Dispense Refill   acyclovir (ZOVIRAX) 400 MG tablet Take 400 mg by mouth every evening.     blood glucose meter kit and supplies Dispense based on patient and insurance preference. Once daily testing DX E11.9 1 each 0   cetirizine (ZYRTEC) 10 MG tablet Take 10 mg by mouth daily.     Continuous Blood Gluc Receiver (DEXCOM G7 RECEIVER) DEVI To check blood glucose PRN 1 each 0   Continuous Blood Gluc Sensor (DEXCOM G7 SENSOR) MISC Check blood glucose at least 3 times before meals and at bedtime, and as needed. 3 each 5   dicyclomine (BENTYL) 10 MG capsule Take 1 capsule (10 mg total) by mouth 4 (four) times daily -  before meals and at bedtime. Monitor for constipation, dry mouth, dizziness 120 capsule 3   doxepin (SINEQUAN) 10 MG capsule Take 10 mg by mouth at bedtime.     DULoxetine (CYMBALTA) 60 MG capsule Take 1 capsule (60 mg total) by mouth 2 (two) times daily.  (Patient taking differently: Take 60 mg by mouth every evening.) 60 capsule 3   FARXIGA 10 MG TABS tablet Take 1 tablet (10 mg total) by mouth daily. 30 tablet 5   gabapentin (NEURONTIN) 800 MG tablet Take 800 mg by mouth 3 (three) times daily.     gemfibrozil (LOPID) 600 MG tablet Take 600 mg by mouth 2 (two) times daily before a meal.     HYDROcodone-acetaminophen (NORCO) 10-325 MG tablet Take 1 tablet by mouth every 4 (four) hours as needed.     hydrocortisone (ANUSOL-HC) 2.5 % rectal cream Place 1 Application rectally 2 (two) times daily. 30 g 1   ipratropium (ATROVENT HFA) 17 MCG/ACT inhaler Inhale 2 puffs into the lungs every 6 (six) hours.     isosorbide dinitrate (ISORDIL) 30 MG tablet Take 1 tablet (30 mg total) by mouth daily. 90 tablet 1   LORazepam (ATIVAN) 0.5 MG tablet Take 1 tablet (0.5 mg total) by mouth 3 (three) times daily as needed for anxiety. 90 tablet 2   meclizine (ANTIVERT) 25 MG tablet Take 1 tablet (25 mg total) by mouth 3 (three) times daily as needed for dizziness. 30 tablet 0   metFORMIN (GLUCOPHAGE) 500 MG tablet Take 1 tablet (500 mg total) by mouth  2 (two) times daily with a meal. 60 tablet 5   montelukast (SINGULAIR) 10 MG tablet Take 10 mg by mouth daily.     NITROSTAT 0.4 MG SL tablet Place 0.4 mg under the tongue every 15 (fifteen) minutes as needed for chest pain.      omeprazole (PRILOSEC) 40 MG capsule Take 40 mg by mouth 2 (two) times daily.     ondansetron (ZOFRAN-ODT) 4 MG disintegrating tablet Take 1 tablet (4 mg total) by mouth every 6 (six) hours as needed for nausea. 20 tablet 0   simethicone (GAS-X) 80 MG chewable tablet Chew 1 tablet (80 mg total) by mouth every 6 (six) hours as needed for flatulence. 30 tablet 0   Tiotropium Bromide-Olodaterol (STIOLTO RESPIMAT) 2.5-2.5 MCG/ACT AERS Inhale 2 each into the lungs daily.     tirzepatide [facility]) 5 MG/0.5ML Pen Inject 5 mg into the skin once a week. 6 mL 1   Allergies  Allergen Reactions   Sulfa  Antibiotics Shortness Of Breath, Swelling and Rash   Dilaudid [Hydromorphone Hcl]     Makes me crazy   Iron     Throat starts closing up, tongue swells, severe headaches and backpain   Metronidazole Diarrhea and Nausea And Vomiting   Prednisone Other (See Comments)    angry angry    Statins     Other reaction(s): Other (See Comments) Unknown    Cephalexin Diarrhea and Nausea And Vomiting   Methotrexate Derivatives Swelling and Rash   Morphine And Related Anxiety    "exreme mood swings"   Active Problems:   * No active hospital problems. *  Vitals: There were no vitals taken for this visit. Lab results:No results found for this or any previous visit (from the past 19 hour(s)). Radiology Results: No results found. General appearance: mildly obese Head: Normocephalic, without obvious abnormality, atraumatic Eyes: negative Nose: Nares normal. Septum midline. Mucosa normal. No drainage or sinus tenderness. Throat: Bilateral mandibular lingual tori. Gross decay teeth #26, 27.  Neck: no adenopathy and supple, symmetrical, trachea midline Cor RRR Lungs: Clear, no wheezing  Assessment: Non-restorable teeth 26, 27, bilateral lingual tori. Discussed risks indicated by pulmonary eg. Pneumonia, recurrent intubation risk, prolonged or recurrent acute respiratory failure needing mechanical ventilation, prolonged hospitalization, DVT/PE, acute pulmonary edema. Patient wishes to proceed.   Plan: Dental extraction x 2, remove bilateral mandibular lingual tori. GA. Day surgery.   Diona Browner 04/12/2022

## 2022-04-13 ENCOUNTER — Other Ambulatory Visit: Payer: Self-pay

## 2022-04-13 ENCOUNTER — Ambulatory Visit (HOSPITAL_COMMUNITY)
Admission: RE | Admit: 2022-04-13 | Discharge: 2022-04-13 | Disposition: A | Discharge status: Home / Self Care | Payer: Medicare Other | Source: Ambulatory Visit | Attending: Internal Medicine | Admitting: Internal Medicine

## 2022-04-13 ENCOUNTER — Ambulatory Visit (INDEPENDENT_AMBULATORY_CARE_PROVIDER_SITE_OTHER): Payer: Medicare Other | Admitting: Internal Medicine

## 2022-04-13 ENCOUNTER — Other Ambulatory Visit: Payer: Self-pay | Admitting: *Deleted

## 2022-04-13 ENCOUNTER — Ambulatory Visit (HOSPITAL_COMMUNITY): Payer: Medicare Other

## 2022-04-13 ENCOUNTER — Encounter: Payer: Self-pay | Admitting: Internal Medicine

## 2022-04-13 ENCOUNTER — Encounter (HOSPITAL_COMMUNITY): Payer: Self-pay | Admitting: Oral Surgery

## 2022-04-13 VITALS — BP 138/70 | HR 90 | Resp 18 | Ht 68.0 in | Wt 170.8 lb

## 2022-04-13 DIAGNOSIS — W19XXXA Unspecified fall, initial encounter: Secondary | ICD-10-CM

## 2022-04-13 DIAGNOSIS — R220 Localized swelling, mass and lump, head: Secondary | ICD-10-CM | POA: Diagnosis not present

## 2022-04-13 DIAGNOSIS — M5030 Other cervical disc degeneration, unspecified cervical region: Secondary | ICD-10-CM | POA: Diagnosis not present

## 2022-04-13 DIAGNOSIS — L304 Erythema intertrigo: Secondary | ICD-10-CM

## 2022-04-13 DIAGNOSIS — D170 Benign lipomatous neoplasm of skin and subcutaneous tissue of head, face and neck: Secondary | ICD-10-CM | POA: Insufficient documentation

## 2022-04-13 MED ORDER — GEMFIBROZIL 600 MG PO TABS: 600.0000 mg | ORAL_TABLET | Freq: Two times a day (BID) | ORAL | 1 refills | Status: DC

## 2022-04-13 MED ORDER — KETOCONAZOLE 2 % EX CREA: 1.0000 | TOPICAL_CREAM | Freq: Every day | CUTANEOUS | 0 refills | Status: DC

## 2022-04-13 MED ORDER — METHOCARBAMOL 500 MG PO TABS: 500.0000 mg | ORAL_TABLET | Freq: Three times a day (TID) | ORAL | 1 refills | Status: DC | PRN

## 2022-04-13 NOTE — Patient Instructions (Addendum)
Please take Robaxin as needed for muscle spasms.  Please take Norco as needed for neck pain.  Please apply Ketoconazole cream for genital rash.

## 2022-04-13 NOTE — Progress Notes (Signed)
Acute Office Visit  Subjective:    Patient ID: Kayla Ryan, female    DOB: 10-Aug-1955, 66 y.o.   MRN: [id number]  Chief Complaint  Patient presents with   Fall    Patient had fall 04-11-22 hit two chairs and the oxygen concentrator bruised on right arm but feels like she twisted her neck     HPI Patient is in today for complaint of neck pain and right arm pain after mechanical fall at home on 04/11/2022.  She was standing on a stepstool trying to reach to a cabinet, and while turning around, she fell backwards and hit her neck and right arm area over the oxygen concentrator and a chair.  She had a bruising over her right arm, which has improved now.  She denies any LOC.  Her blood glucose was around 123 on CGM when she fell.  She denied any prodromal symptoms such as dizziness or chest pain before the fall.  She denies any new onset numbness or weakness of the UE.  Her neck pain is constant, worse with bending and slightly improved with Norco.  She also reports perineal area redness and itching when she takes [place].  Denies any vaginal discharge currently.  Past Medical History:  Diagnosis Date   Allergy Unknown   Anemia    Anxiety 1883   Arthritis    Asthma    Blood transfusion without reported diagnosis 2012   Chest pain    Chronic bronchitis    Clotting disorder ([place]) 2015   Congestive heart failure (CHF) (HCC)    COPD (chronic obstructive pulmonary disease) (HCC)    Crohn disease ([place])    Diabetes mellitus without complication ([place]) 1/88/4166   Emphysema    Emphysema of lung (HCC)    Fibromyalgia    GERD (gastroesophageal reflux disease) 2980   Herpes    Hyperlipidemia    Hypertension 2015   IBS (irritable bowel syndrome)    Kidney failure    Migraines    Muscular dystrophy ([place])    Neck pain    Oxygen deficiency 2022   Plantar fasciitis    Polymyositis ([place])    Right thyroid nodule 04/07/2022   Scoliosis     Past Surgical History:  Procedure Laterality  Date   ABDOMINAL HYSTERECTOMY     AGILE CAPSULE N/A 06/24/2021   Procedure: AGILE CAPSULE;  Surgeon: Eloise Harman, DO;  [facility];  Service: Endoscopy;  Laterality: N/A;   AGILE CAPSULE N/A 07/22/2021   Procedure: AGILE CAPSULE;  Surgeon: Daneil Dolin, MD;  [facility];  Service: Endoscopy;  Laterality: N/A;  7:30am   bone spur     BREAST SURGERY  1995   CHOLECYSTECTOMY     COLONOSCOPY WITH PROPOFOL N/A 04/30/2021   Procedure: COLONOSCOPY WITH PROPOFOL;  Surgeon: Daneil Dolin, MD;  [facility];  Service: Endoscopy;  Laterality: N/A;   ESOPHAGEAL DILATION N/A 04/30/2021   Procedure: ESOPHAGEAL DILATION;  Surgeon: Daneil Dolin, MD;  [facility];  Service: Endoscopy;  Laterality: N/A;   ESOPHAGOGASTRODUODENOSCOPY (EGD) WITH PROPOFOL N/A 04/30/2021   Procedure: ESOPHAGOGASTRODUODENOSCOPY (EGD) WITH PROPOFOL;  Surgeon: Daneil Dolin, MD;  [facility];  Service: Endoscopy;  Laterality: N/A;   GIVENS CAPSULE STUDY N/A 09/22/2021   Procedure: GIVENS CAPSULE STUDY;  Surgeon: Daneil Dolin, MD;  [facility];  Service: Endoscopy;  Laterality: N/A;  7:30am   HERNIA REPAIR  LEFT HEART CATHETERIZATION WITH CORONARY ANGIOGRAM N/A 11/07/2013   Procedure: LEFT HEART CATHETERIZATION WITH CORONARY ANGIOGRAM;  Surgeon: Lorretta Harp, MD;  Location: [facility] CATH LAB;  Service: Cardiovascular;  Laterality: N/A;   MASTECTOMY PARTIAL / LUMPECTOMY     OOPHORECTOMY     ROTATOR CUFF REPAIR      Family History  Problem Relation Age of Onset   Lung cancer Mother    Cancer Mother    Miscarriages / [place] Mother    Varicose Veins Mother    COPD Father    Lung cancer Father    Lymphoma Father    Alcohol abuse Father    Arthritis Father    Anxiety disorder Sister    Depression Brother    Anxiety disorder Sister     Social History   Socioeconomic History   Marital status: Widowed    Spouse name: Not on file    Number of children: Not on file   Years of education: Not on file   Highest education level: Not on file  Occupational History   Not on file  Tobacco Use   Smoking status: Former    Packs/day: 1.00    Years: 49.00    Total pack years: 49.00    Types: Cigarettes    Start date: 08/08/1966    Quit date: 08/31/2005    Years since quitting: 16.6   Smokeless tobacco: Never  Vaping Use   Vaping Use: Never used  Substance and Sexual Activity   Alcohol use: No   Drug use: No    Comment: used marijuana in teens   Sexual activity: Not Currently    Birth control/protection: Surgical  Other Topics Concern   Not on file  Social History Narrative   Not on file   Social Determinants of Health   Financial Resource Strain: Low Risk  (03/14/2022)   Overall Financial Resource Strain (CARDIA)    Difficulty of Paying Living Expenses: Not hard at all  Food Insecurity: No Food Insecurity (03/14/2022)   Hunger Vital Sign    Worried About Running Out of Food in the Last Year: Never true    [place] in the Last Year: Never true  Transportation Needs: No Transportation Needs (03/14/2022)   PRAPARE - Hydrologist (Medical): No    Lack of Transportation (Non-Medical): No  Physical Activity: Inactive (03/14/2022)   Exercise Vital Sign    Days of Exercise per Week: 0 days    Minutes of Exercise per Session: 0 min  Stress: No Stress Concern Present (03/14/2022)   [place]    Feeling of Stress : Not at all  Social Connections: Socially Isolated (03/14/2022)   Social Connection and Isolation Panel [NHANES]    Frequency of Communication with Friends and Family: More than three times a week    Frequency of Social Gatherings with Friends and Family: Twice a week    Attends Religious Services: Never    Marine scientist or Organizations: No    Attends Archivist Meetings: Never    Marital Status:  Widowed  Intimate Partner Violence: Not At Risk (03/14/2022)   Humiliation, Afraid, Rape, and Kick questionnaire    Fear of Current or Ex-Partner: No    Emotionally Abused: No    Physically Abused: No    Sexually Abused: No    Outpatient Medications Prior to Visit  Medication Sig Dispense Refill   acyclovir (  ZOVIRAX) 400 MG tablet Take 400 mg by mouth every evening.     blood glucose meter kit and supplies Dispense based on patient and insurance preference. Once daily testing DX E11.9 1 each 0   cetirizine (ZYRTEC) 10 MG tablet Take 10 mg by mouth 2 (two) times daily.     Cholecalciferol (VITAMIN D3) 50 MCG (2000 UT) capsule Take 2,000 Units by mouth daily.     Continuous Blood Gluc Receiver (DEXCOM G7 RECEIVER) DEVI To check blood glucose PRN 1 each 0   Continuous Blood Gluc Sensor (DEXCOM G7 SENSOR) MISC Check blood glucose at least 3 times before meals and at bedtime, and as needed. 3 each 5   dicyclomine (BENTYL) 10 MG capsule Take 1 capsule (10 mg total) by mouth 4 (four) times daily -  before meals and at bedtime. Monitor for constipation, dry mouth, dizziness (Patient taking differently: Take 10 mg by mouth 4 (four) times daily as needed (Bowel Spasm/Diarrhea). Monitor for constipation, dry mouth, dizziness) 120 capsule 3   diphenhydrAMINE (BENADRYL) 25 MG tablet Take 25 mg by mouth every 4 (four) hours as needed for allergies or itching.     doxepin (SINEQUAN) 10 MG capsule Take 10 mg by mouth at bedtime.     DULoxetine (CYMBALTA) 60 MG capsule Take 1 capsule (60 mg total) by mouth 2 (two) times daily. (Patient taking differently: Take 60 mg by mouth every evening.) 60 capsule 3   FARXIGA 10 MG TABS tablet Take 1 tablet (10 mg total) by mouth daily. 30 tablet 5   gabapentin (NEURONTIN) 800 MG tablet Take 800 mg by mouth 3 (three) times daily.     gemfibrozil (LOPID) 600 MG tablet Take 1 tablet (600 mg total) by mouth 2 (two) times daily before a meal. 180 tablet 1    Glycerin-Hypromellose-PEG 400 (DRY EYE RELIEF DROPS OP) Place 1 drop into both eyes 3 (three) times daily as needed (Dry eye).     HYDROcodone-acetaminophen (NORCO) 10-325 MG tablet Take 1 tablet by mouth every 4 (four) hours as needed for moderate pain or severe pain.     hydrocortisone (ANUSOL-HC) 2.5 % rectal cream Place 1 Application rectally 2 (two) times daily. (Patient taking differently: Place 1 Application rectally 2 (two) times daily as needed for hemorrhoids or anal itching.) 30 g 1   ipratropium (ATROVENT HFA) 17 MCG/ACT inhaler Inhale 2 puffs into the lungs every 4 (four) hours as needed (COPD).     isosorbide dinitrate (ISORDIL) 30 MG tablet Take 1 tablet (30 mg total) by mouth daily. (Patient taking differently: Take 30 mg by mouth at bedtime.) 90 tablet 1   LORazepam (ATIVAN) 0.5 MG tablet Take 1 tablet (0.5 mg total) by mouth 3 (three) times daily as needed for anxiety. (Patient taking differently: Take 0.5 mg by mouth 3 (three) times daily.) 90 tablet 2   meclizine (ANTIVERT) 25 MG tablet Take 1 tablet (25 mg total) by mouth 3 (three) times daily as needed for dizziness. 30 tablet 0   metFORMIN (GLUCOPHAGE) 500 MG tablet Take 1 tablet (500 mg total) by mouth 2 (two) times daily with a meal. 60 tablet 5   montelukast (SINGULAIR) 10 MG tablet Take 10 mg by mouth at bedtime.     NITROSTAT 0.4 MG SL tablet Place 0.4 mg under the tongue every 15 (fifteen) minutes as needed for chest pain.      omeprazole (PRILOSEC) 40 MG capsule Take 40 mg by mouth 2 (two) times daily.  ondansetron (ZOFRAN-ODT) 4 MG disintegrating tablet Take 1 tablet (4 mg total) by mouth every 6 (six) hours as needed for nausea. (Patient taking differently: Take 4 mg by mouth every 4 (four) hours as needed for nausea.) 20 tablet 0   simethicone (GAS-X) 80 MG chewable tablet Chew 1 tablet (80 mg total) by mouth every 6 (six) hours as needed for flatulence. 30 tablet 0   Tiotropium Bromide-Olodaterol (STIOLTO RESPIMAT)  2.5-2.5 MCG/ACT AERS Inhale 2 each into the lungs daily.     tirzepatide [facility]) 5 MG/0.5ML Pen Inject 5 mg into the skin once a week. 6 mL 1   vitamin B-12 (CYANOCOBALAMIN) 100 MCG tablet Take 100 mcg by mouth daily.     No facility-administered medications prior to visit.    Allergies  Allergen Reactions   Sulfa Antibiotics Shortness Of Breath, Swelling and Rash   Dilaudid [Hydromorphone Hcl]     Makes me crazy   Iron     Throat starts closing up, tongue swells, severe headaches and backpain   Metronidazole Diarrhea and Nausea And Vomiting   Prednisone Other (See Comments)    Angry     Statins Other (See Comments)    Unknown    Cephalexin Diarrhea and Nausea And Vomiting   Methotrexate Derivatives Swelling and Rash   Morphine And Related Anxiety    "exreme mood swings"    Review of Systems  Constitutional:  Positive for fatigue. Negative for chills and fever.  HENT:  Negative for congestion, sinus pressure, sinus pain and sore throat.   Eyes:  Negative for pain and discharge.  Respiratory:  Positive for shortness of breath. Negative for cough.   Cardiovascular:  Negative for chest pain and palpitations.  Gastrointestinal:  Positive for constipation. Negative for abdominal pain, nausea and vomiting.  Endocrine: Negative for polydipsia and polyuria.  Genitourinary:  Negative for dysuria and hematuria.  Musculoskeletal:  Positive for arthralgias, back pain, gait problem and neck pain. Negative for neck stiffness.  Skin:  Negative for rash.  Neurological:  Negative for dizziness and weakness.  Psychiatric/Behavioral:  Positive for sleep disturbance. Negative for agitation and behavioral problems. The patient is nervous/anxious.        Objective:    Physical Exam Vitals reviewed.  Constitutional:      General: She is not in acute distress.    Appearance: She is not diaphoretic.  HENT:     Head: Normocephalic and atraumatic.     Nose: Nose normal.     Mouth/Throat:      Mouth: Mucous membranes are moist.  Eyes:     General: No scleral icterus.    Extraocular Movements: Extraocular movements intact.  Neck:      Comments: Right sided postauricular mass, about 2 cm in diameter, mobile Cardiovascular:     Rate and Rhythm: Normal rate and regular rhythm.     Pulses: Normal pulses.     Heart sounds: Normal heart sounds. No murmur heard. Pulmonary:     Breath sounds: Normal breath sounds. No wheezing or rales.     Comments: On 3 LPM O2 Musculoskeletal:     Cervical back: Neck supple. Tenderness present.     Right lower leg: No edema.     Left lower leg: No edema.  Skin:    General: Skin is warm.     Findings: Bruising (Right arm) present. No rash.  Neurological:     General: No focal deficit present.     Mental Status: She is alert and  oriented to person, place, and time.     Sensory: Sensory deficit present.     Motor: Weakness (4/5 in b/l UE and LE) present.  Psychiatric:        Mood and Affect: Mood normal.        Behavior: Behavior normal.     BP 138/70 (BP Location: Right Arm, Patient Position: Sitting, Cuff Size: Normal)   Pulse 90   Resp 18   Ht 5' 8"  (1.727 m)   Wt 170 lb 12.8 oz (77.5 kg)   SpO2 97%   BMI 25.97 kg/m  Wt Readings from Last 3 Encounters:  04/13/22 170 lb 12.8 oz (77.5 kg)  03/10/22 178 lb (80.7 kg)  03/03/22 176 lb 12.8 oz (80.2 kg)        Assessment & Plan:   Problem List Items Addressed This Visit       Musculoskeletal and Integument   DDD (degenerative disc disease), cervical    Chronic neck pain, recently worse after the fall Check x-ray of cervical spine Continue Norco as needed, followed by pain clinic Added Robaxin as needed for muscle spasms       Relevant Medications   methocarbamol (ROBAXIN) 500 MG tablet   Other Relevant Orders   DG Cervical Spine Complete   Other Visit Diagnoses     Fall, initial encounter    -  Primary Recently worse neck pain and right arm bruising Fall likely  mechanical, in the absence of prodromal symptoms Check x-ray of cervical spine to rule out compression fracture Right arm bruising improving now, very low concern for acute fracture    Relevant Orders   DG Cervical Spine Complete   Intertrigo     Ketoconazole cream prescribed Advised to maintain adequate hydration to avoid acute vaginitis or perineal infection from [place]   Relevant Medications   ketoconazole (NIZORAL) 2 % cream        Meds ordered this encounter  Medications   methocarbamol (ROBAXIN) 500 MG tablet    Sig: Take 1 tablet (500 mg total) by mouth every 8 (eight) hours as needed for muscle spasms.    Dispense:  30 tablet    Refill:  1   ketoconazole (NIZORAL) 2 % cream    Sig: Apply 1 Application topically daily.    Dispense:  30 g    Refill:  0     Saoirse Legere Keith Rake, MD

## 2022-04-13 NOTE — Assessment & Plan Note (Signed)
Chronic neck pain, recently worse after the fall Check x-ray of cervical spine Continue Norco as needed, followed by pain clinic Added Robaxin as needed for muscle spasms

## 2022-04-13 NOTE — Progress Notes (Signed)
PCP - Dr. Shawnie Pons  Cardiologist - Denies  EP- Denies  Endocrine- Denies  Pulm- Denies  Chest x-ray - 03/10/22 (E)  EKG - 06/29/21 (E)  Stress Test - 01/08/13 (E)  ECHO - 05/02/21 (E)  Cardiac Cath - 11/07/13 (E)  AICD-na PM-na LOOP-na  Nerve Stimulator- Denies  Dialysis- Denies  Sleep Study - Denies CPAP - Denies  LABS- 04/15/22: CBC, BMP  ASA-Denies  ERAS- No  HA1C- 02/03/22(E): 10.9 Fasting Blood Sugar - 98-160 Checks Blood Sugar _All____ day long- Pt has meter to the R upper arm  Anesthesia- Yes- A1C 10.9 and recent fall  Pt denies having chest pain, sob, or fever during the pre-op phone call. All instructions explained to the pt, with a verbal understanding of the material including: as of today, stop taking all Aspirin (unless instructed by your doctor) and Other Aspirin containing products, Vitamins, Fish oils, and Herbal medications. Also stop all NSAIDS i.e. Advil, Ibuprofen, Motrin, Aleve, Anaprox, Naproxen, BC, Goody Powders, and all Supplements.  WHAT DO I DO ABOUT MY DIABETES MEDICATION?  Do not take [place] and Metformin the morning of surgery.      The day of surgery, do not take other diabetes injectable Mounjaro  If your CBG is greater than 220 mg/dL, inform the staff upon arrival to Pre-Op.  How to Manage Your Diabetes Before and After Surgery  How do I manage my blood sugar before surgery? Check your blood sugar at least 4 times a day, starting 2 days before surgery, to make sure that the level is not too high or low. Check your blood sugar the morning of your surgery when you wake up and every 2 hours until you get to the Short Stay unit. If your blood sugar is less than 70 mg/dL, you will need to treat for low blood sugar: Do not take insulin. Treat a low blood sugar (less than 70 mg/dL) with  cup of clear juice (cranberry or apple), 4 glucose tablets, OR glucose gel. Recheck blood sugar in 15 minutes after treatment (to make sure it is  greater than 70 mg/dL). If your blood sugar is not greater than 70 mg/dL on recheck, call [phone]  for further instructions. Report your blood sugar to the short stay nurse when you get to Short Stay.  Reviewed and Endorsed by [facility] Patient Education Committee, August 2015   Pt also instructed to wear a mask and social distance if she goes out. The opportunity to ask questions was provided.

## 2022-04-14 ENCOUNTER — Ambulatory Visit: Payer: Medicare Other | Admitting: Physician Assistant

## 2022-04-14 ENCOUNTER — Encounter (HOSPITAL_COMMUNITY): Payer: Self-pay | Admitting: Oral Surgery

## 2022-04-14 NOTE — Anesthesia Preprocedure Evaluation (Addendum)
Anesthesia Evaluation  Patient identified by MRN, date of birth, ID band Patient awake    Reviewed: Allergy & Precautions, NPO status , Patient's Chart, lab work & pertinent test results  History of Anesthesia Complications (+) PONV and history of anesthetic complications  Airway Mallampati: III  TM Distance: >3 FB Neck ROM: Full    Dental  (+) Dental Advisory Given, Poor Dentition, Missing, Chipped   Pulmonary asthma , COPD, former smoker,    breath sounds clear to auscultation       Cardiovascular hypertension, Pt. on medications +CHF   Rhythm:Regular Rate:Normal  Echo 05/02/21: IMPRESSIONS  1. Left ventricular ejection fraction, by estimation, is 65 to 70%. The left ventricle has normal function. The left ventricle has no regional wall motion abnormalities. Left ventricular diastolic parameters are indeterminate.  2. Right ventricular systolic function is normal. The right ventricular size is normal. There is mildly elevated pulmonary artery systolic pressure.  3. The mitral valve is normal in structure. Trivial mitral valve regurgitation. No evidence of mitral stenosis.  4. The aortic valve was not well visualized. Aortic valve regurgitation is not visualized. No aortic stenosis is present.  5. The inferior vena cava is normal in size with greater than 50% respiratory variability, suggesting right atrial pressure of 3 mmHg.    Neuro/Psych  Headaches, PSYCHIATRIC DISORDERS Anxiety  Neuromuscular disease    GI/Hepatic Neg liver ROS, GERD  ,  Endo/Other  diabetes  Renal/GU Renal disease     Musculoskeletal  (+) Arthritis ,   Abdominal   Peds  Hematology  (+) Blood dyscrasia, anemia ,   Anesthesia Other Findings   Reproductive/Obstetrics                                                          Anesthesia Evaluation  Patient identified by MRN, date of birth, ID band Patient  awake    Reviewed: Allergy & Precautions, H&P , NPO status , Patient's Chart, lab work & pertinent test results, reviewed documented beta blocker date and time   Airway Mallampati: II  TM Distance: >3 FB Neck ROM: full    Dental no notable dental hx.    Pulmonary shortness of breath, asthma , COPD, former smoker,    Pulmonary exam normal breath sounds clear to auscultation       Cardiovascular Exercise Tolerance: Good hypertension, +CHF   Rhythm:regular Rate:Normal     Neuro/Psych Anxiety negative neurological ROS  negative psych ROS   GI/Hepatic negative GI ROS, Neg liver ROS,   Endo/Other  Hypothyroidism   Renal/GU ARFnegative Renal ROS  negative genitourinary   Musculoskeletal negative musculoskeletal ROS (+)   Abdominal   Peds negative pediatric ROS (+)  Hematology  (+) Blood dyscrasia, anemia ,   Anesthesia Other Findings   Reproductive/Obstetrics negative OB ROS                             Anesthesia Physical Anesthesia Plan  ASA: 4 and emergent  Anesthesia Plan: General   Post-op Pain Management:    Induction:   PONV Risk Score and Plan: Propofol infusion  Airway Management Planned:   Additional Equipment:   Intra-op Plan:   Post-operative Plan:   Informed Consent: I have reviewed the patients History and Physical,  chart, labs and discussed the procedure including the risks, benefits and alternatives for the proposed anesthesia with the patient or authorized representative who has indicated his/her understanding and acceptance.     Dental Advisory Given  Plan Discussed with: CRNA  Anesthesia Plan Comments:         Anesthesia Quick Evaluation  Anesthesia Physical Anesthesia Plan  ASA: 3  Anesthesia Plan: General   Post-op Pain Management: Ofirmev IV (intra-op)*   Induction: Intravenous, Rapid sequence and Cricoid pressure planned  PONV Risk Score and Plan: 4 or greater and  Ondansetron, Dexamethasone, Treatment may vary due to age or medical condition, Propofol infusion and TIVA  Airway Management Planned: Nasal ETT and Video Laryngoscope Planned  Additional Equipment:   Intra-op Plan:   Post-operative Plan: Extubation in OR  Informed Consent: I have reviewed the patients History and Physical, chart, labs and discussed the procedure including the risks, benefits and alternatives for the proposed anesthesia with the patient or authorized representative who has indicated his/her understanding and acceptance.     Dental advisory given  Plan Discussed with: CRNA  Anesthesia Plan Comments: (Last dose of Mounjaro 04/08/2022. States she fell at home on Monday. Neck discomfort, but no numbness/tingling or mobility issues. It's just sore. She has full motor throughout upper and lower extremities and good neck mobility.   See PAT note written 04/14/2022 by Myra Gianotti, PA-C.  She has a complex medical history. 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 She has chronic anemia and is followed by GI and hematology with endoscopic testing within the year. Notes indicated her baseline HGB is ~ 8-9. S/p Venofer x4 since July. She was recently diagnosed with diabetes (A1c 10.9 on 02/2022), but is now on 3 agents and reports her fasting CBGs are now ~ 98-160. She is a former smoker and is on home O2 3L. She had paroxysmal afib with RVR in 04/2021 in setting of significant anemia, but echo unremarkable with normal LVEF. She is not on anticoagulation therapy due to recurrent severe anemia. She does have a pending cardiology evaluation for history of "CHF", not for active symptoms. Normal coronaries 2015. She has medical clearance, although from former PCP at [facility]. She also had preoperative pulmonary evaluation on 03/10/22 and felt to be moderate to high risk. Recommendations included: "1. Short duration of surgery as much as possible  and avoid paralytic if possible. 2. Recovery in step down or ICU with Pulmonary consultationif indicated. Aggressive pulmonary toilet with o2, bronchodilatation, and incentive spirometry and early ambulation."  )   Anesthesia Quick Evaluation

## 2022-04-14 NOTE — Progress Notes (Addendum)
Anesthesia Chart Review: Kayla Ryan   Case: [id number] Date/Time: 04/15/22 1006   Procedure: DENTAL RESTORATION/EXTRACTIONS   Anesthesia type: General   Pre-op diagnosis: DENTAL CARIES   Location: [facility] OR ROOM 04 / [place] OR   Surgeons: Diona Browner, DMD       DISCUSSION: Patient is a 66 year old female scheduled for the above procedure.  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 She denied known clotting disorder.   - [facility] admission 04/29/21-05/03/21 for dyspnea and found to have multifocal pneumonia and significant anemia anemic with HGB 5.3 (baseline ~ 8-9).  Pneumonia treated with antibiotic therapy.  FOBT negative. S/p 2 units PRBC. GI consulted with 04/30/21 colonoscopy showing sigmoid diverticulosis, redundant colon and EGD showing normal esophagus, stomach, duodenal bulb, duodenum. She did develop paroxsymal afib with RVR on 05/01/21 treated with diltiazem drip. Echo showed EF 65-70%, no motion abnormalities, normal RV, trivial MR.  Decision made not to initiate anticoagulation due to recurrent anemia.  - [place] 06/23/21-06/27/21 for fall and significant anemia with HGB 5.8. S/p 4 units PRBC (total). Unsuccessful capsule study, and HGB stabilized. Of note, she did ultimately have another capsule study on 09/22/21 that showed: She had a few gastric erosions, nonbleeding. Small nonbleeding AVM noted at 21 minutes 19 seconds appeared to be in the duodenal bulb.  Two nonbleeding  small bowel AVMs noted at 26 minutes 13 seconds and 30 minutes 23 seconds.  Small bowel erosion at 1 hour and 36 minutes no active bleeding noted. No small bowel masses, ulcers noted.  - ED visit on 02/25/22 after she received her 02/02/22 labs in the mail from [facility] in [facility] that showed a glucose of over 500. It sounds like she was started on Farxiga, but was unaware of how high her glucose was until she received the labs in the mail, so she presented to the ED for further evaluation. No signs of DKA, so she was discharged with addition of metformin. She was unaware of diabetes history at that time.    Dr. Hoyt Koch had previously gotten a medical clearance note for this procedure from her former PCP Dr. Charna Archer (received on 02/21/22). However, reportedly she desired to switch PCP's due to how her new diabetes  diagnosis with hyperglycemia was handled. She got established with new PCP Lindell Spar, MD on 03/03/22. Mounjaro added to DM regimen. She also wears a glucose meter on her RUE and says fasting CBG are ~ 98-160 now. Due to "CHF" history, he referred to cardiology, but noted she was currently euvolemic. EKG on 06/23/21 showed she was in [place]. She is scheduled to see Rozann Lesches, MD on 05/26/22. Of note, she did see Dr. Posey Pronto on 04/13/22 following a fall off a stepstool in which she hit two chairs and her oxygen concentrator bruised her right arm. She also felt like she twisted her neck. No LOC. Denied any new onset numbness or weakness in the UE. Neck US and xray have been done. The [place] report is still in process, but xray showed no c-spine fracture.   Last pulmonology visit on 03/10/22 with Parrett,  Tammy, NP. COPD/emphysema currently compensated on present regimen with Stiolot and appeared at baseline without flares. Was able to live independently and still do ADLs, house chores, and driving. Surgical risk for dental procedure classified as, "Surgical risk assessment is Moderate to  High." 4 month follow with Dr. Lamonte Sakai planned.  Last evaluation with [facility] neurologist Dr. 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 He notes, previously diagnosed with inflammatory myopathy in 2006 by muscle biopsy, but did not tolerate prednisone due to agitation, nor methotrexate due to "anaphylaxis". Her sister was diagnosed with inclusion body myositis in 2006, and she has a maternal aunt that also has myopathy. He added, "Genetic testing has not been performed on Kayla Ryan and I am not sure of the status of genetic testing in her sister..Given her fairly unremarkable exam and normal CK, it is unclear if Kayla Ryan suffers from some sort of hereditary myopathy, possibly hereditary inclusion body myopathy, or if her biopsy findings reflected some other sort of low-level inflammatory myopathy. She certainly has not had progression over many years of observation so it is clear that she does not have progressive inflammatory myopathy." He ordered a [employer] Detect MD panel, but results are not viewable in Care Everywhere. Her next visit is scheduled for 11/18/22.  Established with endocrinologist Dr. Posey Pronto on 11/10/21 for multinodular goiter and for history of pituitary microadenoma. Thyroid panel, IGF-I and prolactin levels order. Consider pituitary MRI if results abnormal, although labs were within normal range ([facility]). Right thyroid FNA done on 12/29/21, but specimen unsatisfactory for evaluation. (She did not carry a DM diagnosis when she was last seen there. Her DM is currently being managed by her PCP Lindell Spar, MD. Dr. Posey Pronto is also arranging a repeat thyroid FNA.)  PAT phone RN confirmed when she spoke with patient on 04/13/22 PM , patient instructed to hold [place] until after surgery. Last Darcel Bayley was reported on 04/08/22. She is also to hold metformin  on the morning of surgery.    She has a complex medical history as outlined above, so I did reach out to speak with her. She did call me back after 5:00 PM on 04/14/22. She is followed at [facility] for myopathy/polymyositis. She has chronic anemia and is followed by GI and hematology with endoscopic testing within the year. Notes indicated her baseline HGB is ~ 8-9. She said she has had four Venofer infusions since July 2023. She was recently diagnosed with diabetes, but is now on 3 agents and reports her fasting CBGs are now ~ 98-160 (averages about 135). She is a former smoker and is on home O2 3L. She had afib with RVR in 04/2021 in setting of significant anemia, but echo unremarkable with normal LVEF. She is not on anticoagulation therapy due to recurrent severe anemia. She does have a pending cardiology evaluation for history of "CHF" but was not having active symptoms at the time of referral and was noted to be euvolemic. (The details of her CHF history aren't well documented, although notes suggest she may have been told she had this in 2020 while a patient at [facility]. She told me she had CHF episode during her "high potassium" admission in 2022--in review of records she was admitted in 08/2020 for AKI with hyperkalemia, K > 7.5 in setting of recently prescribed lisinopril and Lasix. She did have prolonged QT  attributed to electrolyte disturbance. No mention of acute CHF then.) Normal coronaries 2015. She has been on isosorbide dinitrate on and off for years. She has medical clearance, although from former PCP at [facility]. She also had preoperative pulmonary evaluation on 03/10/22 and felt to be moderate to high risk. Recommendations included: "1. Short duration of surgery as much as possible and avoid paralytic if possible.  2. Recovery in step down or ICU with Pulmonary consultation if indicated.   Aggressive pulmonary toilet with o2, bronchodilatation, and incentive spirometry and early  ambulation."  Chart reviewed with anesthesiologist Bryson Ha, MD. Patient to be further evaluated on the day of surgery. Definitive plan at that time.  (Of note, I did reiterate to patient will need to have someone with her for 24 hours after surgery.)   VS: Ht 5' 8"  (1.727 m)   Wt 77.1 kg   BMI 25.85 kg/m  BP Readings from Last 3 Encounters:  04/13/22 138/70  04/06/22 (!) 123/45  04/04/22 136/72   Pulse Readings from Last 3 Encounters:  04/13/22 90  04/06/22 71  04/04/22 91     PROVIDERS: Lindell Spar, MD is PCP ([place]) Baltazar Apo, MD is pulmonologist Derek Jack, MD is HEM (for IDA) Caress, Jeneen Rinks, MD is neurologist ([facility]) Amalia Greenhouse, MD is endocrinologist ([facility]) Hurshel Keys, DO is GI   LABS: She will need updated labs on arrival. Her last labs are from 02/25/22 when she was diagnosed with diabetes and glucose was 511. Cr 1.23. H/H 7.7/27.0, Na 129. A1c 10.9. She has had a couple Iron infusions 03/09/22 and 04/06/22.   Spirometry 04/28/20: FVC 2.15 (57%) FEV1 1.27 (44%) FEV1/FVC ratio 59% (77%) DLCO unc/cor 13.31 (57%)   IMAGES: US Soft Tissue Neck 04/13/22: In process.  Xray C-spine 04/13/22: In process.  FINDINGS: There is no evidence of cervical spine fracture or prevertebral soft tissue swelling. There is kyphosis of cervical spine. Minimal grade 1 anterolisthesis of C4 on C5 is identified. Minimal anterior spurring of C5 and C6 are noted. IMPRESSION: No acute fracture or dislocation identified. Mild degenerative joint changes.  CXR 03/10/22: IMPRESSION: 1. Emphysematous changes and pulmonary scarring. 2. Streaky bibasilar density, likely scarring versus subsegmental atelectasis.    EKG: EKG 06/23/21:  Sinus rhythm Incomplete right bundle branch block Anteroseptal infarct, old atrial fibrillation resolved since last tracing Confirmed by Dorie Rank [phone]) on 06/24/2021 7:17:39 PM   CV: Echo  05/02/21: IMPRESSIONS   1. Left ventricular ejection fraction, by estimation, is 65 to 70%. The  left ventricle has normal function. The left ventricle has no regional  wall motion abnormalities. Left ventricular diastolic parameters are  indeterminate.   2. Right ventricular systolic function is normal. The right ventricular  size is normal. There is mildly elevated pulmonary artery systolic  pressure.   3. The mitral valve is normal in structure. Trivial mitral valve  regurgitation. No evidence of mitral stenosis.   4. The aortic valve was not well visualized. Aortic valve regurgitation  is not visualized. No aortic stenosis is present.   5. The inferior vena cava is normal in size with greater than 50%  respiratory variability, suggesting right atrial pressure of 3 mmHg.    US Carotid 06/08/19: IMPRESSION: 1. Mild plaque in the proximal left ICA. Although velocities technically correspond to an estimated 50-69% stenosis, estimated stenosis is felt to more likely be 50% or less based on the mild amount of plaque present. 2. Mild plaque at the level of the  right carotid bulb and proximal right ICA. Estimated right ICA stenosis is less than 50%.   Cardiac cath 11/07/13: ANGIOGRAPHIC RESULTS:   1. Left main; normal  2. LAD; normal 3. Left circumflex; nondominant and normal. There was a moderate size ramus branch which was normal as well.  4. Right coronary artery; dominant and normal 5. Left ventriculography; RAO left ventriculogram was performed using  25 mL of Visipaque dye at 12 mL/second. The overall LVEF estimated  60 % Without wall motion abnormalities   IMPRESSION:Kayla Ryan has normal coronary arteries and normal left ventricular function. I believe her chest pain is noncardiac. Her Myoview stress test result was correct.   Past Medical History:  Diagnosis Date   Allergy Unknown   Anemia    Anxiety 1883   Arthritis    Asthma    Blood transfusion without reported  diagnosis 2012   Chest pain    Chronic bronchitis    Clotting disorder ([place]) 2015   Congestive heart failure (CHF) (HCC)    COPD (chronic obstructive pulmonary disease) (HCC)    Crohn disease ([place])    Diabetes mellitus without complication ([place]) 5/46/2703   Type II   Emphysema    Emphysema of lung (HCC)    Fibromyalgia    GERD (gastroesophageal reflux disease) 2980   Herpes    Hyperlipidemia    Hypertension 2015   IBS (irritable bowel syndrome)    Kidney failure    Migraines    Muscular dystrophy ([place])    Neck pain    Oxygen deficiency 2022   Plantar fasciitis    Polymyositis (HCC)    PONV (postoperative nausea and vomiting)    Right thyroid nodule 04/07/2022   Scoliosis     Past Surgical History:  Procedure Laterality Date   ABDOMINAL HYSTERECTOMY     AGILE CAPSULE N/A 06/24/2021   Procedure: AGILE CAPSULE;  Surgeon: Eloise Harman, DO;  [facility]: [facility];  Service: Endoscopy;  Laterality: N/A;   AGILE CAPSULE N/A 07/22/2021   Procedure: AGILE CAPSULE;  Surgeon: Daneil Dolin, MD;  [facility];  Service: Endoscopy;  Laterality: N/A;  7:30am   bone spur     BREAST SURGERY  1995   CHOLECYSTECTOMY     COLONOSCOPY WITH PROPOFOL N/A 04/30/2021   Procedure: COLONOSCOPY WITH PROPOFOL;  Surgeon: Daneil Dolin, MD;  [facility]: [facility];  Service: Endoscopy;  Laterality: N/A;   ESOPHAGEAL DILATION N/A 04/30/2021   Procedure: ESOPHAGEAL DILATION;  Surgeon: Daneil Dolin, MD;  [facility];  Service: Endoscopy;  Laterality: N/A;   ESOPHAGOGASTRODUODENOSCOPY (EGD) WITH PROPOFOL N/A 04/30/2021   Procedure: ESOPHAGOGASTRODUODENOSCOPY (EGD) WITH PROPOFOL;  Surgeon: Daneil Dolin, MD;  [facility];  Service: Endoscopy;  Laterality: N/A;   GIVENS CAPSULE STUDY N/A 09/22/2021   Procedure: GIVENS CAPSULE STUDY;  Surgeon: Daneil Dolin, MD;  [facility];  Service: Endoscopy;  Laterality: N/A;  7:30am   HERNIA REPAIR      LEFT HEART CATHETERIZATION WITH CORONARY ANGIOGRAM N/A 11/07/2013   Procedure: LEFT HEART CATHETERIZATION WITH CORONARY ANGIOGRAM;  Surgeon: Lorretta Harp, MD;  Location: [facility] CATH LAB;  Service: Cardiovascular;  Laterality: N/A;   MASTECTOMY PARTIAL / LUMPECTOMY     OOPHORECTOMY     ROTATOR CUFF REPAIR      MEDICATIONS: No current facility-administered medications for this encounter.    acyclovir (ZOVIRAX) 400 MG tablet   cetirizine (ZYRTEC) 10 MG tablet  Cholecalciferol (VITAMIN D3) 50 MCG (2000 UT) capsule   dicyclomine (BENTYL) 10 MG capsule   diphenhydrAMINE (BENADRYL) 25 MG tablet   doxepin (SINEQUAN) 10 MG capsule   DULoxetine (CYMBALTA) 60 MG capsule   FARXIGA 10 MG TABS tablet   gabapentin (NEURONTIN) 800 MG tablet   gemfibrozil (LOPID) 600 MG tablet   Glycerin-Hypromellose-PEG 400 (DRY EYE RELIEF DROPS OP)   HYDROcodone-acetaminophen (NORCO) 10-325 MG tablet   hydrocortisone (ANUSOL-HC) 2.5 % rectal cream   ipratropium (ATROVENT HFA) 17 MCG/ACT inhaler   isosorbide dinitrate (ISORDIL) 30 MG tablet   LORazepam (ATIVAN) 0.5 MG tablet   meclizine (ANTIVERT) 25 MG tablet   metFORMIN (GLUCOPHAGE) 500 MG tablet   montelukast (SINGULAIR) 10 MG tablet   NITROSTAT 0.4 MG SL tablet   omeprazole (PRILOSEC) 40 MG capsule   ondansetron (ZOFRAN-ODT) 4 MG disintegrating tablet   simethicone (GAS-X) 80 MG chewable tablet   Tiotropium Bromide-Olodaterol (STIOLTO RESPIMAT) 2.5-2.5 MCG/ACT AERS   tirzepatide (MOUNJARO) 5 MG/0.5ML Pen   vitamin B-12 (CYANOCOBALAMIN) 100 MCG tablet   blood glucose meter kit and supplies   Continuous Blood Gluc Receiver (DEXCOM G7 RECEIVER) DEVI   Continuous Blood Gluc Sensor (DEXCOM G7 SENSOR) MISC   ketoconazole (NIZORAL) 2 % cream   methocarbamol (ROBAXIN) 500 MG tablet    Myra Gianotti, PA-C Surgical Short Stay/Anesthesiology [facility] Phone [phone] [facility] Phone [phone] 04/14/2022 6:44 PM

## 2022-04-15 ENCOUNTER — Encounter (HOSPITAL_COMMUNITY)
Admission: RE | Disposition: A | Discharge status: Home / Self Care | Payer: Self-pay | Source: Home / Self Care | Attending: Oral Surgery

## 2022-04-15 ENCOUNTER — Other Ambulatory Visit: Payer: Self-pay

## 2022-04-15 ENCOUNTER — Ambulatory Visit (HOSPITAL_COMMUNITY): Payer: Medicare Other | Admitting: Physician Assistant

## 2022-04-15 ENCOUNTER — Encounter (HOSPITAL_COMMUNITY): Payer: Self-pay | Admitting: Oral Surgery

## 2022-04-15 ENCOUNTER — Ambulatory Visit (HOSPITAL_COMMUNITY): Payer: Medicare Other | Admitting: Vascular Surgery

## 2022-04-15 ENCOUNTER — Ambulatory Visit (HOSPITAL_COMMUNITY): Payer: Medicare Other

## 2022-04-15 ENCOUNTER — Ambulatory Visit (HOSPITAL_COMMUNITY)
Admission: RE | Admit: 2022-04-15 | Discharge: 2022-04-15 | Disposition: A | Discharge status: Home / Self Care | Payer: Medicare Other | Attending: Oral Surgery | Admitting: Oral Surgery

## 2022-04-15 ENCOUNTER — Ambulatory Visit (HOSPITAL_BASED_OUTPATIENT_CLINIC_OR_DEPARTMENT_OTHER): Payer: Medicare Other | Admitting: Vascular Surgery

## 2022-04-15 DIAGNOSIS — K029 Dental caries, unspecified: Secondary | ICD-10-CM

## 2022-04-15 DIAGNOSIS — J449 Chronic obstructive pulmonary disease, unspecified: Secondary | ICD-10-CM | POA: Diagnosis not present

## 2022-04-15 DIAGNOSIS — K0889 Other specified disorders of teeth and supporting structures: Secondary | ICD-10-CM

## 2022-04-15 DIAGNOSIS — F419 Anxiety disorder, unspecified: Secondary | ICD-10-CM

## 2022-04-15 DIAGNOSIS — M270 Developmental disorders of jaws: Secondary | ICD-10-CM

## 2022-04-15 DIAGNOSIS — I509 Heart failure, unspecified: Secondary | ICD-10-CM | POA: Insufficient documentation

## 2022-04-15 DIAGNOSIS — I110 Hypertensive heart disease with heart failure: Secondary | ICD-10-CM | POA: Diagnosis not present

## 2022-04-15 DIAGNOSIS — E119 Type 2 diabetes mellitus without complications: Secondary | ICD-10-CM | POA: Insufficient documentation

## 2022-04-15 DIAGNOSIS — Z87891 Personal history of nicotine dependence: Secondary | ICD-10-CM | POA: Insufficient documentation

## 2022-04-15 HISTORY — PX: TOOTH EXTRACTION: SHX859

## 2022-04-15 HISTORY — DX: Other specified postprocedural states: R11.2

## 2022-04-15 HISTORY — DX: Other specified postprocedural states: Z98.890

## 2022-04-15 HISTORY — DX: Nausea with vomiting, unspecified: R11.2

## 2022-04-15 LAB — CBC
HCT: 30.2 % — ABNORMAL LOW (ref 36.0–46.0)
Hemoglobin: 8.6 g/dL — ABNORMAL LOW (ref 12.0–15.0)
MCH: 23.1 pg — ABNORMAL LOW (ref 26.0–34.0)
MCHC: 28.5 g/dL — ABNORMAL LOW (ref 30.0–36.0)
MCV: 81.2 fL (ref 80.0–100.0)
Platelets: 459 10*3/uL — ABNORMAL HIGH (ref 150–400)
RBC: 3.72 MIL/uL — ABNORMAL LOW (ref 3.87–5.11)
RDW: 19.7 % — ABNORMAL HIGH (ref 11.5–15.5)
WBC: 5.7 10*3/uL (ref 4.0–10.5)
nRBC: 0 % (ref 0.0–0.2)

## 2022-04-15 LAB — BASIC METABOLIC PANEL
Anion gap: 13 (ref 5–15)
BUN: 28 mg/dL — ABNORMAL HIGH (ref 8–23)
CO2: 27 mmol/L (ref 22–32)
Calcium: 10 mg/dL (ref 8.9–10.3)
Chloride: 100 mmol/L (ref 98–111)
Creatinine, Ser: 1.36 mg/dL — ABNORMAL HIGH (ref 0.44–1.00)
GFR, Estimated: 42 mL/min — ABNORMAL LOW (ref >60)
Glucose, Bld: 97 mg/dL (ref 70–99)
Potassium: 3.9 mmol/L (ref 3.5–5.1)
Sodium: 140 mmol/L (ref 135–145)

## 2022-04-15 LAB — GLUCOSE, CAPILLARY
Glucose-Capillary: 106 mg/dL — ABNORMAL HIGH (ref 70–99)
Glucose-Capillary: 90 mg/dL (ref 70–99)
Glucose-Capillary: 143 mg/dL — ABNORMAL HIGH (ref 70–99)

## 2022-04-15 SURGERY — DENTAL RESTORATION/EXTRACTIONS
Anesthesia: General | Site: Mouth

## 2022-04-15 MED ORDER — CHLORHEXIDINE GLUCONATE 0.12 % MT SOLN: 15.0000 mL | Freq: Once | OROMUCOSAL | Status: AC

## 2022-04-15 MED ORDER — CLINDAMYCIN HCL 300 MG PO CAPS: 300.0000 mg | ORAL_CAPSULE | Freq: Three times a day (TID) | ORAL | 0 refills | Status: DC

## 2022-04-15 MED ORDER — ROCURONIUM BROMIDE 10 MG/ML (PF) SYRINGE
PREFILLED_SYRINGE | INTRAVENOUS | Status: AC
  Filled 2022-04-15: qty 10

## 2022-04-15 MED ORDER — FENTANYL CITRATE (PF) 100 MCG/2ML IJ SOLN
INTRAMUSCULAR | Status: DC | PRN
Start: 2022-04-15 — End: 2022-04-15
  Administered 2022-04-15: 50 ug via INTRAVENOUS

## 2022-04-15 MED ORDER — PROPOFOL 1000 MG/100ML IV EMUL
INTRAVENOUS | Status: AC
  Filled 2022-04-15: qty 100

## 2022-04-15 MED ORDER — LIDOCAINE 2% (20 MG/ML) 5 ML SYRINGE
INTRAMUSCULAR | Status: AC
  Filled 2022-04-15: qty 5

## 2022-04-15 MED ORDER — PHENYLEPHRINE 80 MCG/ML (10ML) SYRINGE FOR IV PUSH (FOR BLOOD PRESSURE SUPPORT)
PREFILLED_SYRINGE | INTRAVENOUS | Status: AC
  Filled 2022-04-15: qty 10

## 2022-04-15 MED ORDER — PROPOFOL 500 MG/50ML IV EMUL
INTRAVENOUS | Status: DC | PRN
  Administered 2022-04-15: 150 ug/kg/min via INTRAVENOUS

## 2022-04-15 MED ORDER — OXYCODONE-ACETAMINOPHEN 10-325 MG PO TABS: 1.0000 | ORAL_TABLET | Freq: Four times a day (QID) | ORAL | 0 refills | Status: DC | PRN

## 2022-04-15 MED ORDER — PROPOFOL 10 MG/ML IV BOLUS
INTRAVENOUS | Status: AC
  Filled 2022-04-15: qty 20

## 2022-04-15 MED ORDER — VANCOMYCIN HCL 1500 MG/300ML IV SOLN
1500.0000 mg | INTRAVENOUS | Status: AC
  Administered 2022-04-15: 1500 mg via INTRAVENOUS
  Filled 2022-04-15: qty 300

## 2022-04-15 MED ORDER — LIDOCAINE-EPINEPHRINE 2 %-1:100000 IJ SOLN
INTRAMUSCULAR | Status: DC | PRN
  Administered 2022-04-15: 20 mL via INTRADERMAL

## 2022-04-15 MED ORDER — MIDAZOLAM HCL 2 MG/2ML IJ SOLN
INTRAMUSCULAR | Status: DC | PRN
  Administered 2022-04-15: 2 mg via INTRAVENOUS

## 2022-04-15 MED ORDER — AMISULPRIDE (ANTIEMETIC) 5 MG/2ML IV SOLN
INTRAVENOUS | Status: AC
  Filled 2022-04-15: qty 4

## 2022-04-15 MED ORDER — LACTATED RINGERS IV SOLN: INTRAVENOUS | Status: DC

## 2022-04-15 MED ORDER — FENTANYL CITRATE (PF) 250 MCG/5ML IJ SOLN
INTRAMUSCULAR | Status: AC
  Filled 2022-04-15: qty 5

## 2022-04-15 MED ORDER — LIDOCAINE-EPINEPHRINE 2 %-1:100000 IJ SOLN
INTRAMUSCULAR | Status: AC
  Filled 2022-04-15: qty 1

## 2022-04-15 MED ORDER — LIDOCAINE 2% (20 MG/ML) 5 ML SYRINGE
INTRAMUSCULAR | Status: DC | PRN
  Administered 2022-04-15: 80 mg via INTRAVENOUS

## 2022-04-15 MED ORDER — ONDANSETRON HCL 4 MG/2ML IJ SOLN
INTRAMUSCULAR | Status: AC
  Filled 2022-04-15: qty 2

## 2022-04-15 MED ORDER — ONDANSETRON HCL 4 MG/2ML IJ SOLN
INTRAMUSCULAR | Status: DC | PRN
  Administered 2022-04-15: 4 mg via INTRAVENOUS

## 2022-04-15 MED ORDER — AMISULPRIDE (ANTIEMETIC) 5 MG/2ML IV SOLN
10.0000 mg | Freq: Once | INTRAVENOUS | Status: AC | PRN
Start: 2022-04-15 — End: 2022-04-15
  Administered 2022-04-15: 10 mg via INTRAVENOUS

## 2022-04-15 MED ORDER — FENTANYL CITRATE (PF) 100 MCG/2ML IJ SOLN
25.0000 ug | INTRAMUSCULAR | Status: DC | PRN
  Administered 2022-04-15: 25 ug via INTRAVENOUS

## 2022-04-15 MED ORDER — SODIUM CHLORIDE 0.9 % IR SOLN
Status: DC | PRN
  Administered 2022-04-15: 1

## 2022-04-15 MED ORDER — CHLORHEXIDINE GLUCONATE 0.12 % MT SOLN
OROMUCOSAL | Status: AC
  Administered 2022-04-15: 15 mL via OROMUCOSAL
  Filled 2022-04-15: qty 15

## 2022-04-15 MED ORDER — ORAL CARE MOUTH RINSE
15.0000 mL | Freq: Once | OROMUCOSAL | Status: AC
Start: 2022-04-15 — End: 2022-04-15

## 2022-04-15 MED ORDER — OXYCODONE HCL 5 MG/5ML PO SOLN
5.0000 mg | Freq: Once | ORAL | Status: AC
  Administered 2022-04-15: 5 mg via ORAL

## 2022-04-15 MED ORDER — SUGAMMADEX SODIUM 200 MG/2ML IV SOLN
INTRAVENOUS | Status: DC | PRN
  Administered 2022-04-15: 200 mg via INTRAVENOUS

## 2022-04-15 MED ORDER — MIDAZOLAM HCL 2 MG/2ML IJ SOLN
INTRAMUSCULAR | Status: AC
  Filled 2022-04-15: qty 2

## 2022-04-15 MED ORDER — 0.9 % SODIUM CHLORIDE (POUR BTL) OPTIME
TOPICAL | Status: DC | PRN
  Administered 2022-04-15: 1000 mL

## 2022-04-15 MED ORDER — FENTANYL CITRATE (PF) 100 MCG/2ML IJ SOLN
INTRAMUSCULAR | Status: AC
  Filled 2022-04-15: qty 2

## 2022-04-15 MED ORDER — OXYCODONE HCL 5 MG/5ML PO SOLN
ORAL | Status: AC
  Filled 2022-04-15: qty 5

## 2022-04-15 MED ORDER — ROCURONIUM BROMIDE 10 MG/ML (PF) SYRINGE
PREFILLED_SYRINGE | INTRAVENOUS | Status: DC | PRN
  Administered 2022-04-15: 40 mg via INTRAVENOUS

## 2022-04-15 MED ORDER — OXYMETAZOLINE HCL 0.05 % NA SOLN
NASAL | Status: AC
  Filled 2022-04-15: qty 30

## 2022-04-15 MED ORDER — PROPOFOL 10 MG/ML IV BOLUS
INTRAVENOUS | Status: DC | PRN
  Administered 2022-04-15: 120 mg via INTRAVENOUS

## 2022-04-15 SURGICAL SUPPLY — 37 items
BAG COUNTER SPONGE SURGICOUNT (BAG) IMPLANT
BAG SPNG CNTER NS LX DISP (BAG)
BLADE SURG 15 STRL LF DISP TIS (BLADE) ×1 IMPLANT
BLADE SURG 15 STRL SS (BLADE) ×1
BUR CROSS CUT FISSURE 1.6 (BURR) ×1 IMPLANT
BUR EGG ELITE 4.0 (BURR) ×1 IMPLANT
CANISTER SUCT 3000ML PPV (MISCELLANEOUS) ×1 IMPLANT
COVER SURGICAL LIGHT HANDLE (MISCELLANEOUS) ×1 IMPLANT
DRAPE U-SHAPE 76X120 STRL (DRAPES) ×1 IMPLANT
GAUZE PACKING FOLDED 2  STR (GAUZE/BANDAGES/DRESSINGS) ×1
GAUZE PACKING FOLDED 2 STR (GAUZE/BANDAGES/DRESSINGS) ×1 IMPLANT
GLOVE BIO SURGEON STRL SZ 6.5 (GLOVE) IMPLANT
GLOVE BIO SURGEON STRL SZ7 (GLOVE) IMPLANT
GLOVE BIO SURGEON STRL SZ8 (GLOVE) ×1 IMPLANT
GLOVE BIOGEL PI IND STRL 6.5 (GLOVE) IMPLANT
GLOVE BIOGEL PI IND STRL 7.0 (GLOVE) IMPLANT
GOWN STRL REUS W/ TWL LRG LVL3 (GOWN DISPOSABLE) ×1 IMPLANT
GOWN STRL REUS W/ TWL XL LVL3 (GOWN DISPOSABLE) ×1 IMPLANT
GOWN STRL REUS W/TWL LRG LVL3 (GOWN DISPOSABLE)
GOWN STRL REUS W/TWL XL LVL3 (GOWN DISPOSABLE) ×1
IV NS 1000ML (IV SOLUTION) ×1
IV NS 1000ML BAXH (IV SOLUTION) ×1 IMPLANT
KIT BASIN OR (CUSTOM PROCEDURE TRAY) ×1 IMPLANT
KIT TURNOVER KIT B (KITS) ×1 IMPLANT
NDL HYPO 25GX1X1/2 BEV (NEEDLE) ×2 IMPLANT
NEEDLE HYPO 25GX1X1/2 BEV (NEEDLE) ×2 IMPLANT
NS IRRIG 1000ML POUR BTL (IV SOLUTION) ×1 IMPLANT
PAD ARMBOARD 7.5X6 YLW CONV (MISCELLANEOUS) ×1 IMPLANT
SLEEVE IRRIGATION ELITE 7 (MISCELLANEOUS) ×1 IMPLANT
SPIKE FLUID TRANSFER (MISCELLANEOUS) ×1 IMPLANT
SPONGE SURGIFOAM ABS GEL 12-7 (HEMOSTASIS) IMPLANT
SUT CHROMIC 3 0 PS 2 (SUTURE) ×1 IMPLANT
SYR BULB IRRIG 60ML STRL (SYRINGE) ×1 IMPLANT
SYR CONTROL 10ML LL (SYRINGE) ×1 IMPLANT
TRAY ENT MC OR (CUSTOM PROCEDURE TRAY) ×1 IMPLANT
TUBING IRRIGATION (MISCELLANEOUS) ×1 IMPLANT
YANKAUER SUCT BULB TIP NO VENT (SUCTIONS) ×1 IMPLANT

## 2022-04-15 NOTE — Anesthesia Postprocedure Evaluation (Signed)
Anesthesia Post Note  Patient: Kayla Ryan  Procedure(s) Performed: DENTAL RESTORATION/EXTRACTIONS (Mouth)     Patient location during evaluation: PACU Anesthesia Type: General Level of consciousness: sedated and patient cooperative Pain management: pain level controlled Vital Signs Assessment: post-procedure vital signs reviewed and stable Respiratory status: spontaneous breathing Cardiovascular status: stable Anesthetic complications: no   No notable events documented.  Last Vitals:  Vitals:   04/15/22 1215 04/15/22 1230  BP: (!) 174/59 137/82  Pulse: 86 83  Resp: (!) 21 18  Temp:  36.6 C  SpO2: 95% 96%    Last Pain:  Vitals:   04/15/22 1230  TempSrc:   PainSc: [place]

## 2022-04-15 NOTE — H&P (Signed)
H&P documentation  -History and Physical Reviewed  -Patient has been re-examined  -No change in the plan of care  Kayla Ryan  

## 2022-04-15 NOTE — Op Note (Signed)
04/15/2022  11:37 AM  PATIENT:  Kayla Ryan  66 y.o. female  PRE-OPERATIVE DIAGNOSIS:  NON-RESTORABLE TEETH # 26, 27 SECONDARY TO DENTAL CARIES; BILATERAL MANDIBULAR LINGUAL TORI.  POST-OPERATIVE DIAGNOSIS:  SAME  PROCEDURE:  Procedure(s): EXTRACTION TEETH # 26, 27, REMOVAL BILATERAL MANDIBULAR TORI  SURGEON:  Surgeon(s): Diona Browner, DMD  ANESTHESIA:   local and general  EBL:  minimal  DRAINS: none   SPECIMEN:  No Specimen  COUNTS:  YES  PLAN OF CARE: Discharge to home after PACU  PATIENT DISPOSITION:  PACU - hemodynamically stable.   PROCEDURE DETAILS: Dictation # [id number]  Gae Bon, DMD 04/15/2022 11:37 AM

## 2022-04-15 NOTE — Anesthesia Procedure Notes (Addendum)
Procedure Name: Intubation Date/Time: 04/15/2022 11:51 AM  Performed by: Barrington Ellison, CRNAPre-anesthesia Checklist: Patient identified, Emergency Drugs available, Suction available and Patient being monitored Patient Re-evaluated:Patient Re-evaluated prior to induction Oxygen Delivery Method: Circle System Utilized Preoxygenation: Pre-oxygenation with 100% oxygen Induction Type: IV induction and Rapid sequence Laryngoscope Size: Glidescope and 3 Grade View: Grade I Nasal Tubes: Nasal Rae and Nasal prep performed Tube size: 7.0 mm Number of attempts: 1 Placement Confirmation: ETT inserted through vocal cords under direct vision, positive ETCO2 and breath sounds checked- equal and bilateral Secured at: 24 cm Tube secured with: Tape Dental Injury: Teeth and Oropharynx as per pre-operative assessment

## 2022-04-15 NOTE — Transfer of Care (Signed)
Immediate Anesthesia Transfer of Care Note  Patient: Kayla Ryan  Procedure(s) Performed: DENTAL RESTORATION/EXTRACTIONS (Mouth)  Patient Location: PACU  Anesthesia Type:General  Level of Consciousness: awake, alert  and oriented  Airway & Oxygen Therapy: Patient Spontanous Breathing and Patient connected to nasal cannula oxygen  Post-op Assessment: Report given to RN  Post vital signs: Reviewed and stable  Last Vitals:  Vitals Value Taken Time  BP 143/47 04/15/22 1146  Temp    Pulse 90 04/15/22 1148  Resp 15 04/15/22 1148  SpO2 91 % 04/15/22 1148  Vitals shown include unvalidated device data.  Last Pain:  Vitals:   04/15/22 0835  TempSrc:   PainSc: 8       Patients Stated Pain Goal: 2 (96/94/09 8286)  Complications: No notable events documented.

## 2022-04-15 NOTE — Op Note (Unsigned)
NAME: Kayla Ryan, MARCELYN RUPPE. MEDICAL RECORD NO: [id number] ACCOUNT NO: [id number] DATE OF BIRTH: Jul 13, 1956 FACILITY: [facility] LOCATION: [facility] PHYSICIAN: Gae Bon, DDS  Operative Report   DATE OF PROCEDURE: 04/15/2022   PREOPERATIVE DIAGNOSES:  Nonrestorable teeth #26, 27, secondary to dental caries, bilateral mandibular lingual tori.  POSTOPERATIVE DIAGNOSIS:     PROCEDURE:  Extraction teeth #26 and 27, removal bilateral mandibular lingual tori.  SURGEON:  Gae Bon, DDS  ANESTHESIA:  General, nasal intubation, Dr. Lissa Hoard attending.  DESCRIPTION OF PROCEDURE:  The patient was taken to the operating room and placed on table in supine position.  General anesthesia was administered and nasal endotracheal tube was placed and secured.  The eyes were protected.  The patient was draped for  surgery.  Timeout was performed.  The posterior pharynx was suctioned and a throat pack was placed.  2% lidocaine 1:100,000 epinephrine was infiltrated in an inferior alveolar block on the right and left sides and buccal infiltration was administered in  the anterior mandible.  A bite block was placed on the right side of the mouth.  A sweetheart retractor was used to retract the tongue.  A #15 blade was used to make an incision beginning at the molar region of the left mandible, carried forward along  the edentulous ridge until tooth #22 was encountered.  The periosteum was reflected lingually to expose the lingual torus.  A Seldin retractor was used to retract the lingual tissues and then the egg bur was used to reduce the torus by contouring.  Then,  the bone file was used to further smooth the area.  Then, the area was irrigated and closed with 3-0 chromic.  The bite block was repositioned to the other side of the mouth.  The 15 blade was used to make an incision on the edentulous ridge of the  right mandible, beginning in the molar region, carried forward to tooth #28.  The incision was carried  lingually in the gingival sulcus to the mesial aspect of #28 and then carried forward over the retained roots of #27 and 26.  The periosteum was  reflected around the teeth 26 and 27.  The Stryker handpiece was used to remove the interproximal bone, so that a purchase point could be made with the elevator.  Then, the 301 elevator was used to elevate the roots.  Then, the sockets were curetted.   The periosteum was further reflected to expose the lingual torus.  The Seldin retractor was used to protect the lingual tissues.  The Stryker handpiece with egg bur was used under irrigation to remove the torus using recontouring. The bone file was then  used to further smooth the area and then the area was irrigated and closed with 3-0 chromic.  The oral cavity was then irrigated and suctioned.  Additional local anesthesia was administered.  Throat pack was removed.  The patient was left under care of  anesthesia for extubation and transported to recovery room with plans for discharge home through day surgery.  ESTIMATED BLOOD LOSS:  Minimal.  COMPLICATIONS:  None.  SPECIMEN:  None.   [email or web address] D: 04/15/2022 11:41:31 am T: 04/15/2022 12:10:00 pm  JOB: [id number]/ [id number]

## 2022-04-16 ENCOUNTER — Encounter (HOSPITAL_COMMUNITY): Payer: Self-pay | Admitting: Oral Surgery

## 2022-04-20 ENCOUNTER — Other Ambulatory Visit: Payer: Medicare Other

## 2022-04-20 ENCOUNTER — Other Ambulatory Visit: Payer: Self-pay | Admitting: *Deleted

## 2022-04-20 DIAGNOSIS — R42 Dizziness and giddiness: Secondary | ICD-10-CM

## 2022-04-20 MED ORDER — MECLIZINE HCL 25 MG PO TABS: 25.0000 mg | ORAL_TABLET | Freq: Three times a day (TID) | ORAL | 0 refills | Status: DC | PRN

## 2022-04-21 ENCOUNTER — Ambulatory Visit: Payer: Medicare Other | Admitting: Physician Assistant

## 2022-04-22 ENCOUNTER — Telehealth: Payer: Self-pay | Admitting: Internal Medicine

## 2022-04-22 NOTE — Telephone Encounter (Signed)
Patient has called in about darker stool diarrhea (pure water)   Patient has oral surgery last Friday   Wants a call back in regard to stool  Is it form surgery or Augmentin

## 2022-04-25 ENCOUNTER — Encounter: Payer: Self-pay | Admitting: *Deleted

## 2022-04-25 ENCOUNTER — Ambulatory Visit (INDEPENDENT_AMBULATORY_CARE_PROVIDER_SITE_OTHER): Payer: Medicare Other | Admitting: Internal Medicine

## 2022-04-25 ENCOUNTER — Encounter: Payer: Self-pay | Admitting: Internal Medicine

## 2022-04-25 DIAGNOSIS — R197 Diarrhea, unspecified: Secondary | ICD-10-CM

## 2022-04-25 NOTE — Patient Instructions (Signed)
Please maintain adequate hydration by taking at least 64 ounces of fluid in a day and eat at regular intervals.

## 2022-04-25 NOTE — Telephone Encounter (Signed)
Scheduled per patient no earlier than 11 am.

## 2022-04-25 NOTE — Progress Notes (Signed)
Virtual Visit via Telephone Note   This visit type was conducted due to national recommendations for restrictions regarding the COVID-19 Pandemic (e.g. social distancing) in an effort to limit this patient's exposure and mitigate transmission in our community.  Due to her co-morbid illnesses, this patient is at least at moderate risk for complications without adequate follow up.  This format is felt to be most appropriate for this patient at this time.  The patient did not have access to video technology/had technical difficulties with video requiring transitioning to audio format only (telephone).  All issues noted in this document were discussed and addressed.  No physical exam could be performed with this format.  Evaluation Performed:  Follow-up visit  Date:  04/25/2022   ID:  Kayla Ryan, DOB Nov 19, 1955, MRN [id number]  Patient Location: Home Provider Location: Office/Clinic  Participants: Patient Location of Patient: Home Location of Provider: Telehealth Consent was obtain for visit to be over via telehealth. I verified that I am speaking with the correct person using two identifiers.  PCP:  Lindell Spar, MD   Chief Complaint: Diarrhea  History of Present Illness:    Kayla Ryan is a 66 y.o. female who has a televisit for c/o watery diarrhea, which has been darker recently.  Of note, she recently had dental work-up done, and has been placed on amoxicillin.  She reports that her stool color has improved now, but still has watery to loose BM.  Denies any fever, chills, nausea or vomiting currently.  Denies any recent outside food intake.  Due to her dental work-up, she has been eating liquid and semisolid food only currently.  The patient does not have symptoms concerning for COVID-19 infection (fever, chills, cough, or new shortness of breath).   Past Medical, Surgical, Social History, Allergies, and Medications have been Reviewed.  Past Medical History:  Diagnosis  Date   Allergy Unknown   Anemia    Anxiety 1883   Arthritis    Asthma    Blood transfusion without reported diagnosis 2012   Chest pain    Chronic bronchitis    Congestive heart failure (CHF) (HCC)    COPD (chronic obstructive pulmonary disease) (HCC)    Crohn disease ([place])    Diabetes mellitus without complication ([place]) 5/36/1443   Type II   Emphysema    Emphysema of lung (HCC)    Fibromyalgia    GERD (gastroesophageal reflux disease) 2980   Herpes    Hyperlipidemia    Hypertension 2015   IBS (irritable bowel syndrome)    Kidney failure    Migraines    Muscular dystrophy ([place])    Neck pain    Oxygen deficiency 2022   PAF (paroxysmal atrial fibrillation) ([place]) 05/01/2021   PAF with RVR in setting of severe anemia   Plantar fasciitis    Polymyositis (HCC)    PONV (postoperative nausea and vomiting)    Right thyroid nodule 04/07/2022   Scoliosis    Past Surgical History:  Procedure Laterality Date   ABDOMINAL HYSTERECTOMY     AGILE CAPSULE N/A 06/24/2021   Procedure: AGILE CAPSULE;  Surgeon: Eloise Harman, DO;  [facility];  Service: Endoscopy;  Laterality: N/A;   AGILE CAPSULE N/A 07/22/2021   Procedure: AGILE CAPSULE;  Surgeon: Daneil Dolin, MD;  [facility];  Service: Endoscopy;  Laterality: N/A;  7:30am   bone spur     [place]  COLONOSCOPY WITH PROPOFOL N/A 04/30/2021   Procedure: COLONOSCOPY WITH PROPOFOL;  Surgeon: Daneil Dolin, MD;  [facility];  Service: Endoscopy;  Laterality: N/A;   ESOPHAGEAL DILATION N/A 04/30/2021   Procedure: ESOPHAGEAL DILATION;  Surgeon: Daneil Dolin, MD;  [facility];  Service: Endoscopy;  Laterality: N/A;   ESOPHAGOGASTRODUODENOSCOPY (EGD) WITH PROPOFOL N/A 04/30/2021   Procedure: ESOPHAGOGASTRODUODENOSCOPY (EGD) WITH PROPOFOL;  Surgeon: Daneil Dolin, MD;  [facility];  Service: Endoscopy;  Laterality: N/A;   GIVENS CAPSULE  STUDY N/A 09/22/2021   Procedure: GIVENS CAPSULE STUDY;  Surgeon: Daneil Dolin, MD;  [facility];  Service: Endoscopy;  Laterality: N/A;  7:30am   HERNIA REPAIR     LEFT HEART CATHETERIZATION WITH CORONARY ANGIOGRAM N/A 11/07/2013   Procedure: LEFT HEART CATHETERIZATION WITH CORONARY ANGIOGRAM;  Surgeon: Lorretta Harp, MD;  Location: [facility] CATH LAB;  Service: Cardiovascular;  Laterality: N/A;   MASTECTOMY PARTIAL / LUMPECTOMY     OOPHORECTOMY     ROTATOR CUFF REPAIR     TOOTH EXTRACTION N/A 04/15/2022   Procedure: DENTAL RESTORATION/EXTRACTIONS;  Surgeon: Diona Browner, DMD;  Location: [facility];  Service: Oral Surgery;  Laterality: N/A;     Current Meds  Medication Sig   acyclovir (ZOVIRAX) 400 MG tablet Take 400 mg by mouth every evening.   blood glucose meter kit and supplies Dispense based on patient and insurance preference. Once daily testing DX E11.9   cetirizine (ZYRTEC) 10 MG tablet Take 10 mg by mouth 2 (two) times daily.   Cholecalciferol (VITAMIN D3) 50 MCG (2000 UT) capsule Take 2,000 Units by mouth daily.   clindamycin (CLEOCIN) 300 MG capsule Take 1 capsule (300 mg total) by mouth 3 (three) times daily.   Continuous Blood Gluc Receiver ([place]) DEVI To check blood glucose PRN   Continuous Blood Gluc Sensor (DEXCOM G7 SENSOR) MISC Check blood glucose at least 3 times before meals and at bedtime, and as needed.   dicyclomine (BENTYL) 10 MG capsule Take 1 capsule (10 mg total) by mouth 4 (four) times daily -  before meals and at bedtime. Monitor for constipation, dry mouth, dizziness (Patient taking differently: Take 10 mg by mouth 4 (four) times daily as needed (Bowel Spasm/Diarrhea). Monitor for constipation, dry mouth, dizziness)   diphenhydrAMINE (BENADRYL) 25 MG tablet Take 25 mg by mouth every 4 (four) hours as needed for allergies or itching.   doxepin (SINEQUAN) 10 MG capsule Take 10 mg by mouth at bedtime.   DULoxetine (CYMBALTA) 60 MG capsule Take 1  capsule (60 mg total) by mouth 2 (two) times daily. (Patient taking differently: Take 60 mg by mouth every evening.)   FARXIGA 10 MG TABS tablet Take 1 tablet (10 mg total) by mouth daily.   gabapentin (NEURONTIN) 800 MG tablet Take 800 mg by mouth 3 (three) times daily.   gemfibrozil (LOPID) 600 MG tablet Take 1 tablet (600 mg total) by mouth 2 (two) times daily before a meal.   Glycerin-Hypromellose-PEG 400 (DRY EYE RELIEF DROPS OP) Place 1 drop into both eyes 3 (three) times daily as needed (Dry eye).   hydrocortisone (ANUSOL-HC) 2.5 % rectal cream Place 1 Application rectally 2 (two) times daily. (Patient taking differently: Place 1 Application rectally 2 (two) times daily as needed for hemorrhoids or anal itching.)   ipratropium (ATROVENT HFA) 17 MCG/ACT inhaler Inhale 2 puffs into the lungs every 4 (four) hours as needed (COPD).   isosorbide dinitrate (  ISORDIL) 30 MG tablet Take 1 tablet (30 mg total) by mouth daily. (Patient taking differently: Take 30 mg by mouth at bedtime.)   ketoconazole (NIZORAL) 2 % cream Apply 1 Application topically daily.   LORazepam (ATIVAN) 0.5 MG tablet Take 1 tablet (0.5 mg total) by mouth 3 (three) times daily as needed for anxiety. (Patient taking differently: Take 0.5 mg by mouth 3 (three) times daily.)   meclizine (ANTIVERT) 25 MG tablet Take 1 tablet (25 mg total) by mouth 3 (three) times daily as needed for dizziness.   metFORMIN (GLUCOPHAGE) 500 MG tablet Take 1 tablet (500 mg total) by mouth 2 (two) times daily with a meal.   methocarbamol (ROBAXIN) 500 MG tablet Take 1 tablet (500 mg total) by mouth every 8 (eight) hours as needed for muscle spasms.   montelukast (SINGULAIR) 10 MG tablet Take 10 mg by mouth at bedtime.   NITROSTAT 0.4 MG SL tablet Place 0.4 mg under the tongue every 15 (fifteen) minutes as needed for chest pain.    omeprazole (PRILOSEC) 40 MG capsule Take 40 mg by mouth 2 (two) times daily.   ondansetron (ZOFRAN-ODT) 4 MG disintegrating  tablet Take 1 tablet (4 mg total) by mouth every 6 (six) hours as needed for nausea. (Patient taking differently: Take 4 mg by mouth every 4 (four) hours as needed for nausea.)   simethicone (GAS-X) 80 MG chewable tablet Chew 1 tablet (80 mg total) by mouth every 6 (six) hours as needed for flatulence.   Tiotropium Bromide-Olodaterol (STIOLTO RESPIMAT) 2.5-2.5 MCG/ACT AERS Inhale 2 each into the lungs daily.   tirzepatide [facility]) 5 MG/0.5ML Pen Inject 5 mg into the skin once a week.   vitamin B-12 (CYANOCOBALAMIN) 100 MCG tablet Take 100 mcg by mouth daily.     Allergies:   Sulfa antibiotics, Dilaudid [hydromorphone hcl], Iron, Metronidazole, Prednisone, Statins, Cephalexin, Methotrexate derivatives, and Morphine and related   ROS:   Please see the history of present illness.     All other systems reviewed and are negative.   Labs/Other Tests and Data Reviewed:    Recent Labs: 05/01/2021: TSH 3.918 06/23/2021: ALT 14; B Natriuretic Peptide 119.0 06/26/2021: Magnesium 2.6 04/15/2022: BUN 28; Creatinine, Ser 1.36; Hemoglobin 8.6; Platelets 459; Potassium 3.9; Sodium 140   Recent Lipid Panel No results found for: "CHOL", "TRIG", "HDL", "CHOLHDL", "LDLCALC", "LDLDIRECT"  Wt Readings from Last 3 Encounters:  04/15/22 170 lb (77.1 kg)  04/13/22 170 lb 12.8 oz (77.5 kg)  03/10/22 178 lb (80.7 kg)     ASSESSMENT & PLAN:    Diarrhea Acute, likely due to antibiotic and recent liquid/semisolid food intake Few episodes of dark stool (?),  could be due to bleeding from dental workup, improved now Advised to maintain adequate hydration Advance diet as tolerated  Time:   Today, I have spent 9 minutes reviewing the chart, including problem list, medications, and with the patient with telehealth technology discussing the above problems.   Medication Adjustments/Labs and Tests Ordered: Current medicines are reviewed at length with the patient today.  Concerns regarding medicines are  outlined above.   Tests Ordered: No orders of the defined types were placed in this encounter.   Medication Changes: No orders of the defined types were placed in this encounter.    Note: This dictation was prepared with Dragon dictation along with smaller phrase technology. Similar sounding words can be transcribed inadequately or may not be corrected upon review. Any transcriptional errors that result from this process are unintentional.  Disposition:  Follow up  Signed, Lindell Spar, MD  04/25/2022 11:32 AM     [place]

## 2022-04-27 ENCOUNTER — Inpatient Hospital Stay: Payer: Medicare Other

## 2022-04-27 ENCOUNTER — Other Ambulatory Visit: Payer: Medicare Other

## 2022-04-27 NOTE — Progress Notes (Unsigned)
Kayla Cleveland, MD  [name]   [place] FNA R thyroid nodule (repeat)   Previous bx 12/29/21  [facility] Cytopathology Report                           Case: [id number]                                Authorizing Provider:  Ardis Rowan, [place]  Collected:           12/29/2021 01:43 PM          Ordering Location:     Ultrasound Imaging - [facility]  Received:            12/29/2021 03:14 PM                                 [name]                                                        Pathologist:           Dalphine Handing, MD                                                                    Specimen:    Thyroid, 2.1 cm Right complex thyroid nodule.                                          Specimen A.  Final Interpretation  THYROID, RIGHT NODULE, FINE NEEDLE ASPIRATION II (SMEARS, THIN PREP AND CELL BLOCK):               Nondiagnostic specimen (Bethesda category I).               Predominately cystic fluid and blood.     [facility]

## 2022-04-28 ENCOUNTER — Inpatient Hospital Stay: Payer: Medicare Other | Admitting: Physician Assistant

## 2022-05-02 ENCOUNTER — Other Ambulatory Visit (HOSPITAL_COMMUNITY): Payer: Self-pay

## 2022-05-02 ENCOUNTER — Encounter (HOSPITAL_COMMUNITY): Payer: Self-pay | Admitting: Hematology

## 2022-05-02 MED ORDER — HYDROCODONE-ACETAMINOPHEN 10-325 MG PO TABS
1.0000 | ORAL_TABLET | ORAL | 0 refills | Status: AC | PRN
  Filled 2022-05-02: qty 180, 30d supply, fill #0

## 2022-05-03 ENCOUNTER — Telehealth: Payer: Self-pay | Admitting: Internal Medicine

## 2022-05-03 ENCOUNTER — Other Ambulatory Visit: Payer: Self-pay | Admitting: *Deleted

## 2022-05-03 ENCOUNTER — Other Ambulatory Visit: Payer: Self-pay | Admitting: Internal Medicine

## 2022-05-03 ENCOUNTER — Other Ambulatory Visit (HOSPITAL_COMMUNITY): Payer: Self-pay

## 2022-05-03 DIAGNOSIS — R110 Nausea: Secondary | ICD-10-CM

## 2022-05-03 MED ORDER — ONDANSETRON 4 MG PO TBDP: 4.0000 mg | ORAL_TABLET | Freq: Four times a day (QID) | ORAL | 0 refills | Status: DC | PRN

## 2022-05-03 NOTE — Telephone Encounter (Signed)
Pt called stating she is needing an urgent refill on ondansetron (ZOFRAN-ODT) 4 MG disintegrating tablet. She is out and has been throwing up all day. Can you please refill?     [place]

## 2022-05-03 NOTE — Telephone Encounter (Signed)
Medication sent to pharmacy  

## 2022-05-04 ENCOUNTER — Other Ambulatory Visit: Payer: Self-pay | Admitting: Internal Medicine

## 2022-05-04 ENCOUNTER — Other Ambulatory Visit (HOSPITAL_BASED_OUTPATIENT_CLINIC_OR_DEPARTMENT_OTHER): Payer: Self-pay

## 2022-05-04 ENCOUNTER — Other Ambulatory Visit: Payer: Medicare Other

## 2022-05-04 ENCOUNTER — Telehealth: Payer: Self-pay

## 2022-05-04 DIAGNOSIS — E041 Nontoxic single thyroid nodule: Secondary | ICD-10-CM

## 2022-05-04 NOTE — Telephone Encounter (Signed)
Kayla Ryan called from [facility] Radiology regarding ultrasound that is scheduled for tomorrow- biopsy needs more detail information either pictures of where nodules are or needs an order for ultrasound. Call back # [phone]

## 2022-05-05 ENCOUNTER — Ambulatory Visit (HOSPITAL_COMMUNITY): Payer: Medicare Other

## 2022-05-05 ENCOUNTER — Ambulatory Visit (HOSPITAL_COMMUNITY): Admission: RE | Admit: 2022-05-05 | Payer: Medicare Other | Source: Ambulatory Visit

## 2022-05-05 ENCOUNTER — Ambulatory Visit: Payer: Medicare Other | Admitting: Physician Assistant

## 2022-05-11 ENCOUNTER — Inpatient Hospital Stay: Payer: Medicare Other

## 2022-05-12 ENCOUNTER — Ambulatory Visit (HOSPITAL_COMMUNITY)
Admission: RE | Admit: 2022-05-12 | Discharge: 2022-05-12 | Disposition: A | Discharge status: Home / Self Care | Payer: Medicare Other | Source: Ambulatory Visit | Attending: Internal Medicine | Admitting: Internal Medicine

## 2022-05-12 ENCOUNTER — Encounter (HOSPITAL_COMMUNITY): Payer: Self-pay

## 2022-05-12 DIAGNOSIS — E041 Nontoxic single thyroid nodule: Secondary | ICD-10-CM | POA: Insufficient documentation

## 2022-05-12 NOTE — Progress Notes (Signed)
   Pt was schedule today for diagnostic US Thyroid to be read with IR Rad. Possible re biopsy of Right mid thyroid complex cystic lesion  Original US performed at [facility] May 2023 Not read using Tirad protocol. Right mid thyroid nodule: complex cystic lesion  Right mid thyroid nodule biopsy performed at [facility] 12/29/21. 7 FNAs and 2 cc fluid sent for labs Bethesda 1 biopsy And Cell block with no malignant cell identified.  US Thyroid performed today was reviewed with Dr Rolla Plate He reviewed imaging and determined score of Tirad 2 for Right mid thyroid cystic lesion which does not meet criteria for Biopsy. Left lesion was also reported as Tirad 2: does not meet criteria for biopsy. Dr Earleen Newport will officially read US Thyroid today. Report will be sent to Dr Donata Clay.  I discussed all findings with the pt. She is aware of the "score" of the lesions and the recommendations of Radiologist. Discussed with her the cystic lesion on right could be aspirated today--- but may likely recur. Again; Low risk lesions per IR Rad.  She has opted to HOLD on aspiration today. Report will be sent to Dr Posey Pronto. She will await his recommendations

## 2022-05-13 ENCOUNTER — Inpatient Hospital Stay: Payer: Medicare Other | Admitting: Physician Assistant

## 2022-05-16 ENCOUNTER — Ambulatory Visit (INDEPENDENT_AMBULATORY_CARE_PROVIDER_SITE_OTHER): Payer: Medicare Other | Admitting: Internal Medicine

## 2022-05-16 ENCOUNTER — Encounter: Payer: Self-pay | Admitting: Internal Medicine

## 2022-05-16 DIAGNOSIS — R197 Diarrhea, unspecified: Secondary | ICD-10-CM | POA: Diagnosis not present

## 2022-05-16 NOTE — Progress Notes (Signed)
Virtual Visit via Telephone Note   This visit type was conducted via telephone. This format is felt to be most appropriate for this patient at this time.  The patient did not have access to video technology/had technical difficulties with video requiring transitioning to audio format only (telephone).  All issues noted in this document were discussed and addressed.  No physical exam could be performed with this format.  Evaluation Performed:  Follow-up visit  Date:  05/16/2022   ID:  Kayla Ryan, DOB 1956/05/08, MRN [id number]  Patient Location: Home Provider Location: Office/Clinic  Participants: Patient Location of Patient: Home Location of Provider: Telehealth Consent was obtain for visit to be over via telehealth. I verified that I am speaking with the correct person using two identifiers.  PCP:  Lindell Spar, MD   Chief Complaint: Diarrhea  History of Present Illness:    Kayla Ryan is a 66 y.o. female who has a televisit for c/o diarrhea.  She was having constipation in the last week, for which she took milk of magnesia for 2 days.  She started having watery BM this morning.  She has had 8 episodes of loose BM/watery diarrhea today.  Denies any melena or hematochezia.  Denies any fever or chills.  Denies any out of ordinary food intake to attribute for diarrhea.  She reports history of IBS related diarrhea in the past.  The patient does not have symptoms concerning for COVID-19 infection (fever, chills, cough, or new shortness of breath).   Past Medical, Surgical, Social History, Allergies, and Medications have been Reviewed.  Past Medical History:  Diagnosis Date   Allergy Unknown   Anemia    Anxiety 1883   Arthritis    Asthma    Blood transfusion without reported diagnosis 2012   Chest pain    Chronic bronchitis    Congestive heart failure (CHF) (HCC)    COPD (chronic obstructive pulmonary disease) (HCC)    Crohn disease ([place])    Diabetes mellitus  without complication ([place]) 8/52/7782   Type II   Emphysema    Emphysema of lung (HCC)    Fibromyalgia    GERD (gastroesophageal reflux disease) 2980   Herpes    Hyperlipidemia    Hypertension 2015   IBS (irritable bowel syndrome)    Kidney failure    Migraines    Muscular dystrophy ([place])    Neck pain    Oxygen deficiency 2022   PAF (paroxysmal atrial fibrillation) ([place]) 05/01/2021   PAF with RVR in setting of severe anemia   Plantar fasciitis    Polymyositis (HCC)    PONV (postoperative nausea and vomiting)    Right thyroid nodule 04/07/2022   Scoliosis    Past Surgical History:  Procedure Laterality Date   ABDOMINAL HYSTERECTOMY     AGILE CAPSULE N/A 06/24/2021   Procedure: AGILE CAPSULE;  Surgeon: Eloise Harman, DO;  [facility];  Service: Endoscopy;  Laterality: N/A;   AGILE CAPSULE N/A 07/22/2021   Procedure: AGILE CAPSULE;  Surgeon: Daneil Dolin, MD;  [facility];  Service: Endoscopy;  Laterality: N/A;  7:30am   bone spur     BREAST SURGERY  1995   CHOLECYSTECTOMY     COLONOSCOPY WITH PROPOFOL N/A 04/30/2021   Procedure: COLONOSCOPY WITH PROPOFOL;  Surgeon: Daneil Dolin, MD;  [facility];  Service: Endoscopy;  Laterality: N/A;   ESOPHAGEAL DILATION N/A 04/30/2021   Procedure: ESOPHAGEAL DILATION;  Surgeon: Daneil Dolin, MD;  [facility];  Service: Endoscopy;  Laterality: N/A;   ESOPHAGOGASTRODUODENOSCOPY (EGD) WITH PROPOFOL N/A 04/30/2021   Procedure: ESOPHAGOGASTRODUODENOSCOPY (EGD) WITH PROPOFOL;  Surgeon: Daneil Dolin, MD;  [facility];  Service: Endoscopy;  Laterality: N/A;   GIVENS CAPSULE STUDY N/A 09/22/2021   Procedure: GIVENS CAPSULE STUDY;  Surgeon: Daneil Dolin, MD;  [facility];  Service: Endoscopy;  Laterality: N/A;  7:30am   HERNIA REPAIR     LEFT HEART CATHETERIZATION WITH CORONARY ANGIOGRAM N/A 11/07/2013   Procedure: LEFT HEART CATHETERIZATION WITH CORONARY  ANGIOGRAM;  Surgeon: Lorretta Harp, MD;  Location: [facility] CATH LAB;  Service: Cardiovascular;  Laterality: N/A;   MASTECTOMY PARTIAL / LUMPECTOMY     OOPHORECTOMY     ROTATOR CUFF REPAIR     TOOTH EXTRACTION N/A 04/15/2022   Procedure: DENTAL RESTORATION/EXTRACTIONS;  Surgeon: Diona Browner, DMD;  Location: [place];  Service: Oral Surgery;  Laterality: N/A;     Current Meds  Medication Sig   acyclovir (ZOVIRAX) 400 MG tablet Take 400 mg by mouth every evening.   blood glucose meter kit and supplies Dispense based on patient and insurance preference. Once daily testing DX E11.9   cetirizine (ZYRTEC) 10 MG tablet Take 10 mg by mouth 2 (two) times daily.   Cholecalciferol (VITAMIN D3) 50 MCG (2000 UT) capsule Take 2,000 Units by mouth daily.   clindamycin (CLEOCIN) 300 MG capsule Take 1 capsule (300 mg total) by mouth 3 (three) times daily.   Continuous Blood Gluc Receiver ([place]) DEVI To check blood glucose PRN   Continuous Blood Gluc Sensor (DEXCOM G7 SENSOR) MISC Check blood glucose at least 3 times before meals and at bedtime, and as needed.   dicyclomine (BENTYL) 10 MG capsule Take 1 capsule (10 mg total) by mouth 4 (four) times daily -  before meals and at bedtime. Monitor for constipation, dry mouth, dizziness (Patient taking differently: Take 10 mg by mouth 4 (four) times daily as needed (Bowel Spasm/Diarrhea). Monitor for constipation, dry mouth, dizziness)   diphenhydrAMINE (BENADRYL) 25 MG tablet Take 25 mg by mouth every 4 (four) hours as needed for allergies or itching.   doxepin (SINEQUAN) 10 MG capsule Take 10 mg by mouth at bedtime.   DULoxetine (CYMBALTA) 60 MG capsule Take 1 capsule (60 mg total) by mouth 2 (two) times daily. (Patient taking differently: Take 60 mg by mouth every evening.)   FARXIGA 10 MG TABS tablet Take 1 tablet (10 mg total) by mouth daily.   gabapentin (NEURONTIN) 800 MG tablet Take 800 mg by mouth 3 (three) times daily.   gemfibrozil (LOPID) 600 MG  tablet Take 1 tablet (600 mg total) by mouth 2 (two) times daily before a meal.   Glycerin-Hypromellose-PEG 400 (DRY EYE RELIEF DROPS OP) Place 1 drop into both eyes 3 (three) times daily as needed (Dry eye).   HYDROcodone-acetaminophen (NORCO) 10-325 MG tablet Take 1 tablet by mouth every 4 (four) hours as needed. Stop Hydrocodone 7.5/325mg tablet.   hydrocortisone (ANUSOL-HC) 2.5 % rectal cream Place 1 Application rectally 2 (two) times daily. (Patient taking differently: Place 1 Application rectally 2 (two) times daily as needed for hemorrhoids or anal itching.)   ipratropium (ATROVENT HFA) 17 MCG/ACT inhaler Inhale 2 puffs into the lungs every 4 (four) hours as needed (COPD).   isosorbide dinitrate (ISORDIL) 30 MG tablet Take 1 tablet (30 mg total) by mouth daily. (Patient taking differently: Take 30  mg by mouth at bedtime.)   ketoconazole (NIZORAL) 2 % cream Apply 1 Application topically daily.   LORazepam (ATIVAN) 0.5 MG tablet Take 1 tablet (0.5 mg total) by mouth 3 (three) times daily as needed for anxiety. (Patient taking differently: Take 0.5 mg by mouth 3 (three) times daily.)   meclizine (ANTIVERT) 25 MG tablet Take 1 tablet (25 mg total) by mouth 3 (three) times daily as needed for dizziness.   metFORMIN (GLUCOPHAGE) 500 MG tablet Take 1 tablet (500 mg total) by mouth 2 (two) times daily with a meal.   methocarbamol (ROBAXIN) 500 MG tablet Take 1 tablet (500 mg total) by mouth every 8 (eight) hours as needed for muscle spasms.   montelukast (SINGULAIR) 10 MG tablet Take 10 mg by mouth at bedtime.   NITROSTAT 0.4 MG SL tablet Place 0.4 mg under the tongue every 15 (fifteen) minutes as needed for chest pain.    omeprazole (PRILOSEC) 40 MG capsule Take 40 mg by mouth 2 (two) times daily.   ondansetron (ZOFRAN-ODT) 4 MG disintegrating tablet Take 1 tablet (4 mg total) by mouth every 6 (six) hours as needed for nausea.   simethicone (GAS-X) 80 MG chewable tablet Chew 1 tablet (80 mg total) by  mouth every 6 (six) hours as needed for flatulence.   Tiotropium Bromide-Olodaterol (STIOLTO RESPIMAT) 2.5-2.5 MCG/ACT AERS Inhale 2 each into the lungs daily.   tirzepatide [facility]) 5 MG/0.5ML Pen Inject 5 mg into the skin once a week.   vitamin B-12 (CYANOCOBALAMIN) 100 MCG tablet Take 100 mcg by mouth daily.     Allergies:   Sulfa antibiotics, Dilaudid [hydromorphone hcl], Iron, Metronidazole, Prednisone, Statins, Cephalexin, Methotrexate derivatives, and Morphine and related   ROS:   Please see the history of present illness.     All other systems reviewed and are negative.   Labs/Other Tests and Data Reviewed:    Recent Labs: 06/23/2021: ALT 14; B Natriuretic Peptide 119.0 06/26/2021: Magnesium 2.6 04/15/2022: BUN 28; Creatinine, Ser 1.36; Hemoglobin 8.6; Platelets 459; Potassium 3.9; Sodium 140   Recent Lipid Panel No results found for: "CHOL", "TRIG", "HDL", "CHOLHDL", "LDLCALC", "LDLDIRECT"  Wt Readings from Last 3 Encounters:  04/15/22 170 lb (77.1 kg)  04/13/22 170 lb 12.8 oz (77.5 kg)  03/10/22 178 lb (80.7 kg)     ASSESSMENT & PLAN:    Diarrhea Likely due to acute gastroenteritis Advised to stay well-hydrated Also recently had milk of magnesia, which could contribute to loose BM If she has persistent watery diarrhea, will check stool GI profile - Has had recent antibiotic exposure for dental procedure in the last month    Time:   Today, I have spent 9 minutes reviewing the chart, including problem list, medications, and with the patient with telehealth technology discussing the above problems.   Medication Adjustments/Labs and Tests Ordered: Current medicines are reviewed at length with the patient today.  Concerns regarding medicines are outlined above.   Tests Ordered: No orders of the defined types were placed in this encounter.   Medication Changes: No orders of the defined types were placed in this encounter.    Note: This dictation was  prepared with Dragon dictation along with smaller phrase technology. Similar sounding words can be transcribed inadequately or may not be corrected upon review. Any transcriptional errors that result from this process are unintentional.      Disposition:  Follow up  Signed, Lindell Spar, MD  05/16/2022 4:03 PM     [facility]  [place]

## 2022-05-16 NOTE — Patient Instructions (Addendum)
Please maintain adequate hydration by taking at least 64 ounces of fluid in a day.  Avoid skipping any meals.  Please contact us if your diarrhea does not improve in 2 days.

## 2022-05-18 ENCOUNTER — Ambulatory Visit: Payer: Medicare Other | Admitting: Internal Medicine

## 2022-05-18 ENCOUNTER — Inpatient Hospital Stay: Payer: Medicare Other | Attending: Hematology

## 2022-05-18 ENCOUNTER — Telehealth: Payer: Self-pay | Admitting: Internal Medicine

## 2022-05-18 DIAGNOSIS — D509 Iron deficiency anemia, unspecified: Secondary | ICD-10-CM | POA: Insufficient documentation

## 2022-05-18 LAB — CBC WITH DIFFERENTIAL/PLATELET
Abs Immature Granulocytes: 0.05 10*3/uL (ref 0.00–0.07)
Basophils Absolute: 0.1 10*3/uL (ref 0.0–0.1)
Basophils Relative: 1 %
Eosinophils Absolute: 0.4 10*3/uL (ref 0.0–0.5)
Eosinophils Relative: 3 %
HCT: 32 % — ABNORMAL LOW (ref 36.0–46.0)
Hemoglobin: 9.5 g/dL — ABNORMAL LOW (ref 12.0–15.0)
Immature Granulocytes: 0 %
Lymphocytes Relative: 16 %
Lymphs Abs: 1.8 10*3/uL (ref 0.7–4.0)
MCH: 24.4 pg — ABNORMAL LOW (ref 26.0–34.0)
MCHC: 29.7 g/dL — ABNORMAL LOW (ref 30.0–36.0)
MCV: 82.3 fL (ref 80.0–100.0)
Monocytes Absolute: 0.8 10*3/uL (ref 0.1–1.0)
Monocytes Relative: 7 %
Neutro Abs: 8.4 10*3/uL — ABNORMAL HIGH (ref 1.7–7.7)
Neutrophils Relative %: 73 %
Platelets: 369 10*3/uL (ref 150–400)
RBC: 3.89 MIL/uL (ref 3.87–5.11)
RDW: 18.6 % — ABNORMAL HIGH (ref 11.5–15.5)
WBC: 11.4 10*3/uL — ABNORMAL HIGH (ref 4.0–10.5)
nRBC: 0 % (ref 0.0–0.2)

## 2022-05-18 LAB — IRON AND TIBC
Iron: 23 ug/dL — ABNORMAL LOW (ref 28–170)
Saturation Ratios: 4 % — ABNORMAL LOW (ref 10.4–31.8)
TIBC: 537 ug/dL — ABNORMAL HIGH (ref 250–450)
UIBC: 514 ug/dL

## 2022-05-18 LAB — FERRITIN: Ferritin: 23 ng/mL (ref 11–307)

## 2022-05-18 NOTE — Telephone Encounter (Signed)
Patient has missed last two in office appts will need an inoffice visit before sent

## 2022-05-18 NOTE — Telephone Encounter (Signed)
Pt called stating she is having back muscle spasms & is wanting to know if she can get a rx for Flexeril?? States she has been on this for year?   [facility] [address]     Don't see medication in chart

## 2022-05-19 ENCOUNTER — Ambulatory Visit (INDEPENDENT_AMBULATORY_CARE_PROVIDER_SITE_OTHER): Payer: Medicare Other | Admitting: Family Medicine

## 2022-05-19 ENCOUNTER — Other Ambulatory Visit: Payer: Self-pay

## 2022-05-19 ENCOUNTER — Encounter: Payer: Self-pay | Admitting: Family Medicine

## 2022-05-19 DIAGNOSIS — R197 Diarrhea, unspecified: Secondary | ICD-10-CM | POA: Diagnosis not present

## 2022-05-19 DIAGNOSIS — J209 Acute bronchitis, unspecified: Secondary | ICD-10-CM

## 2022-05-19 DIAGNOSIS — J019 Acute sinusitis, unspecified: Secondary | ICD-10-CM

## 2022-05-19 MED ORDER — AZITHROMYCIN 250 MG PO TABS: ORAL_TABLET | ORAL | 0 refills | Status: AC

## 2022-05-19 NOTE — Progress Notes (Signed)
Virtual Visit via telephone Note  I connected with Kayla Ryan on 05/19/22 at  3:00 PM EDT by a video enabled telemedicine application and verified that I am speaking with the correct person using two identifiers.  Location: Patient: home Provider: office   I discussed the limitations of evaluation and management by telemedicine and the availability of in person appointments. The patient expressed understanding and agreed to proceed.  History of Present Illness: Diarrhea x 4 days,between 8 and 12 per day, a lot of abd pain and spasm, 5 stool today , nausea no vomit, has meds for nausea  fever of 101 las tnight, tylenol got up to 100. Green mucus from nose , deep cough green sputum Observations/Objective: There were no vitals taken for this visit. Good communication with no confusion and intact memory. Alert and oriented x 3 Nasal/head congestion with intermittent cough during visit Assessment and Plan: Acute sinusitis Z pack prescribed, continue current decongestant,  Ensure good hydration and rest , call for worsening/ persistent symptoms  Acute bronchitis Z pack prescribed and needs to use decongestant she has Obtain covid test  Diarrhea Improving per pt report, continue symptomatic rx with bentyl and anti  emetic    Follow Up Instructions:    I discussed the assessment and treatment plan with the patient. The patient was provided an opportunity to ask questions and all were answered. The patient agreed with the plan and demonstrated an understanding of the instructions.   The patient was advised to call back or seek an in-person evaluation if the symptoms worsen or if the condition fails to improve as anticipated.  I provided 13 minutes of non-face-to-face time during this encounter.   Tula Nakayama, MD

## 2022-05-19 NOTE — Patient Instructions (Addendum)
F/U with Dr Posey Pronto as before, call if you need to be seen  sooner , stay well hydrated and use OT C decongestant as before  Nurse pls arrange for her to come today  [place] is improving , continue zofran and bentyl as needed  Thanks for choosing [facility], we consider it a privelige to serve you.

## 2022-05-19 NOTE — Progress Notes (Signed)
Virtual Visit via Telephone Note [facility]  I connected with Kayla Ryan  on 05/20/2022 at  9:10 AM by telephone and verified that I am speaking with the correct person using two identifiers.  Location: Patient: Home Provider: [facility]   I discussed the limitations, risks, security and privacy concerns of performing an evaluation and management service by telephone and the availability of in person appointments. I also discussed with the patient that there may be a patient responsible charge related to this service. The patient expressed understanding and agreed to proceed.  REASON FOR VISIT:  Follow-up for iron deficiency anemia  CURRENT THERAPY: IV iron infusions  INTERVAL HISTORY:  Kayla Ryan 66 y.o. female is contacted today for routine follow-up of her iron deficiency anemia.  She was seen for initial consultation by Dr. Delton Coombes on 02/25/2022.  She received Venofer 100 mg x 3 from 03/03/2022 through 04/06/2022.  She was scheduled for 2 more doses, but canceled those appointments due to experiencing severe low back pain, nausea, vomiting, and diarrhea after her iron infusions.  She tolerated the infusions themselves fairly well since she had antiemetic and antianxiety medications beforehand.  (Unable to take steroid medication since they cause severe agitation and behavioral disturbances in her.)  She denies any shortness of breath, swelling, or rash associated with her most recent iron infusions, but had severe back pain, nausea, and vomiting as soon as the premeds wore off.  She did have some improved energy after her IV iron, but is starting to feel fatigued again.  She reports an episode of coffee ground bowel movement last week.    She has 25% energy and little to no appetite. She endorses that she is maintaining a stable weight.    OBSERVATIONS/OBJECTIVE: Review of Systems  Constitutional:  Negative for chills, diaphoresis and fever.  HENT:   Negative for nosebleeds.   Respiratory:  Positive for cough and shortness of breath. Negative for hemoptysis.   Cardiovascular:  Negative for chest pain and palpitations.  Gastrointestinal:  Positive for constipation, diarrhea, nausea and vomiting. Negative for abdominal pain and blood in stool.  Genitourinary:  Negative for hematuria.  Musculoskeletal:  Positive for back pain.  Skin: Negative.   Neurological:  Positive for dizziness. Negative for headaches.  Endo/Heme/Allergies:  Does not bruise/bleed easily.  Psychiatric/Behavioral:  Positive for depression. The patient is nervous/anxious.      PHYSICAL EXAM (per limitations of virtual telephone visit): The patient is alert and oriented x 3, exhibiting adequate mentation, good mood, and ability to speak in full sentences and execute sound judgement.   ASSESSMENT & PLAN: 1.  Severe iron deficiency anemia: - Etiology chronic GI blood loss, CKD stage IIIb, and iron deficiency.  - Hospitalized in November 2022 with Hgb 5.8, s/p 4 units PRBC - Most recent blood transfusion was in January 2023. - Colonoscopy and EGD (04/30/2021) showed no obvious source of blood loss. - Capsule study (09/22/2021): Few gastric erosions, nonbleeding.  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 - She cannot take oral iron due to allergies (tongue swells, rash, loin pain) - Reports previous reactions to iron infusions with Venofer (severe nausea, rash, itching, myalgias). She has received IV Venofer 100 mg every 5 days in [facility] under the direction of Dr. Harlow Asa.  (Per notes by Dr. Harlow Asa from 2020, patient was receiving monthly Venofer as maintenance.)  She is also unable to tolerate steroid premedications due to agitation and mood changes. -  Labs from 01/28/2022 which showed hemoglobin 8.2, MCV 77 and ferritin 5.  She also has mild CKD. - She received Venofer 100 mg x 3 from 03/03/2022 through 04/06/2022, but did not receive any steroid premedications.   She was scheduled for 2 more doses, but canceled those appointments due to experiencing severe low back pain, nausea, vomiting, and diarrhea after her iron infusions. - Apart from the side effects of the iron itself, she reports that she felt improved energy after her IV iron.  - She has black tarry stools 3-4 times per month. - She reports significant fatigue.  Positive for ice pica.  No B symptoms. - Most recent labs (05/18/2022): Hgb 9.5/MCV 82.3, ferritin 23, iron saturation 4% with TIBC 537 - PLAN: Recommend additional IV iron.  Since she has tolerability issues even with low-dose (100 mg Venofer), we consult with pharmacist  to establish the safest options for the patient.  Will update via Orders Only Progress Note following discussion with pharmacy. - Repeat labs and RTC 6 to 8 weeks after IV iron has been completed. - Patient advised to continue close follow-up with GI  2.  Social/family history: - PMH: CKD stage IIIb.  COPD with chronic hypoxic respiratory failure (on 3 L supplemental oxygen continuously), dermatomyositis, GERD, questionable Crohn's disease but with recent EGD/colonoscopy negative for any signs of Crohn's. - She is widowed.  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  Quit smoking 20+ years ago. - Mother had lung cancer and father had lung cancer.  Niece had melanoma.     I discussed the assessment and treatment plan with the patient. The patient was provided an opportunity to ask questions and all were answered. The patient agreed with the plan and demonstrated an understanding of the instructions.   The patient was advised to call back or seek an in-person evaluation if the symptoms worsen or if the condition fails to improve as anticipated.  I provided 23 minutes of non-face-to-face time during this encounter.   Harriett Rush, PA-C 05/21/22 3:01 PM

## 2022-05-20 ENCOUNTER — Encounter: Payer: Self-pay | Admitting: Physician Assistant

## 2022-05-20 ENCOUNTER — Inpatient Hospital Stay (HOSPITAL_BASED_OUTPATIENT_CLINIC_OR_DEPARTMENT_OTHER): Payer: Medicare Other | Admitting: Physician Assistant

## 2022-05-20 ENCOUNTER — Other Ambulatory Visit: Payer: Self-pay | Admitting: Family Medicine

## 2022-05-20 ENCOUNTER — Telehealth: Payer: Self-pay | Admitting: Internal Medicine

## 2022-05-20 ENCOUNTER — Other Ambulatory Visit: Payer: Self-pay | Admitting: Internal Medicine

## 2022-05-20 DIAGNOSIS — D509 Iron deficiency anemia, unspecified: Secondary | ICD-10-CM

## 2022-05-20 DIAGNOSIS — U071 COVID-19: Secondary | ICD-10-CM

## 2022-05-20 MED ORDER — NIRMATRELVIR/RITONAVIR (PAXLOVID) TABLET (RENAL DOSING): 2.0000 | ORAL_TABLET | Freq: Two times a day (BID) | ORAL | 0 refills | Status: AC

## 2022-05-20 NOTE — Telephone Encounter (Signed)
Pt was +COVID on both BINAX NOW test she done last night

## 2022-05-20 NOTE — Telephone Encounter (Signed)
Patient aware.

## 2022-05-20 NOTE — Telephone Encounter (Signed)
Patient called in regard to visit yesterday .  Took at home test after visit and it was covid + Wants to see if provider can send in meds,

## 2022-05-20 NOTE — Telephone Encounter (Signed)
Pls advise.  

## 2022-05-20 NOTE — Telephone Encounter (Signed)
Kayla Ryan   Ryan called stating she is wanting her COVID test results & wants to know if medication is going to be sent in?   Can you please look into this?

## 2022-05-21 ENCOUNTER — Encounter: Payer: Self-pay | Admitting: Family Medicine

## 2022-05-21 DIAGNOSIS — J209 Acute bronchitis, unspecified: Secondary | ICD-10-CM | POA: Insufficient documentation

## 2022-05-21 DIAGNOSIS — R197 Diarrhea, unspecified: Secondary | ICD-10-CM | POA: Insufficient documentation

## 2022-05-21 DIAGNOSIS — J019 Acute sinusitis, unspecified: Secondary | ICD-10-CM | POA: Insufficient documentation

## 2022-05-21 LAB — NOVEL CORONAVIRUS, NAA: SARS-CoV-2, NAA: NOT DETECTED

## 2022-05-21 NOTE — Assessment & Plan Note (Signed)
Z pack prescribed, continue current decongestant,  Ensure good hydration and rest , call for worsening/ persistent symptoms

## 2022-05-21 NOTE — Assessment & Plan Note (Signed)
Improving per pt report, continue symptomatic rx with bentyl and anti  emetic

## 2022-05-21 NOTE — Assessment & Plan Note (Signed)
Z pack prescribed and needs to use decongestant she has Obtain covid test

## 2022-05-22 DIAGNOSIS — J9611 Chronic respiratory failure with hypoxia: Secondary | ICD-10-CM | POA: Diagnosis not present

## 2022-05-22 DIAGNOSIS — J449 Chronic obstructive pulmonary disease, unspecified: Secondary | ICD-10-CM | POA: Diagnosis not present

## 2022-05-23 ENCOUNTER — Ambulatory Visit (HOSPITAL_COMMUNITY): Payer: Medicare Other

## 2022-05-23 ENCOUNTER — Other Ambulatory Visit: Payer: Self-pay | Admitting: Internal Medicine

## 2022-05-23 DIAGNOSIS — R42 Dizziness and giddiness: Secondary | ICD-10-CM

## 2022-05-24 ENCOUNTER — Other Ambulatory Visit: Payer: Self-pay | Admitting: Physician Assistant

## 2022-05-24 DIAGNOSIS — D509 Iron deficiency anemia, unspecified: Secondary | ICD-10-CM

## 2022-05-24 NOTE — Progress Notes (Addendum)
Upon further discussion with supervising physician and pharmacist, we have decided to try Feraheme due to it's more favorable adverse event profile.  Patient has sensitivity to parenteral iron with significant side effects following IV Venofer.  Therefore, we will give trial dose of 100 mg IV Feraheme before increasing to full dose.  We will premedicate with IV Pepcid, IV cetirizine, and Tylenol.  Unable to use IV Solu-Medrol as premedication due to intolerance of steroids (side effects of mood swings and aggression), but we will still keep Solu-Medrol as an option as a rescue medication.

## 2022-05-26 ENCOUNTER — Ambulatory Visit: Payer: Medicare Other | Admitting: Cardiology

## 2022-05-27 ENCOUNTER — Ambulatory Visit: Payer: Medicare Other

## 2022-05-30 ENCOUNTER — Ambulatory Visit: Payer: Medicare Other | Admitting: Internal Medicine

## 2022-06-02 ENCOUNTER — Other Ambulatory Visit: Payer: Self-pay | Admitting: Internal Medicine

## 2022-06-02 DIAGNOSIS — I25118 Atherosclerotic heart disease of native coronary artery with other forms of angina pectoris: Secondary | ICD-10-CM

## 2022-06-02 DIAGNOSIS — F411 Generalized anxiety disorder: Secondary | ICD-10-CM

## 2022-06-03 ENCOUNTER — Other Ambulatory Visit: Payer: Self-pay | Admitting: Physician Assistant

## 2022-06-03 ENCOUNTER — Ambulatory Visit (INDEPENDENT_AMBULATORY_CARE_PROVIDER_SITE_OTHER): Payer: Medicare Other | Admitting: Internal Medicine

## 2022-06-03 ENCOUNTER — Encounter: Payer: Self-pay | Admitting: Internal Medicine

## 2022-06-03 ENCOUNTER — Ambulatory Visit: Payer: Medicare Other

## 2022-06-03 ENCOUNTER — Encounter: Payer: Self-pay | Admitting: Hematology

## 2022-06-03 VITALS — BP 136/58 | HR 100 | Resp 18 | Ht 68.0 in | Wt 156.8 lb

## 2022-06-03 DIAGNOSIS — E041 Nontoxic single thyroid nodule: Secondary | ICD-10-CM | POA: Diagnosis not present

## 2022-06-03 DIAGNOSIS — E162 Hypoglycemia, unspecified: Secondary | ICD-10-CM | POA: Diagnosis not present

## 2022-06-03 DIAGNOSIS — E1149 Type 2 diabetes mellitus with other diabetic neurological complication: Secondary | ICD-10-CM | POA: Diagnosis not present

## 2022-06-03 DIAGNOSIS — Z23 Encounter for immunization: Secondary | ICD-10-CM

## 2022-06-03 DIAGNOSIS — I10 Essential (primary) hypertension: Secondary | ICD-10-CM

## 2022-06-03 DIAGNOSIS — Z1159 Encounter for screening for other viral diseases: Secondary | ICD-10-CM

## 2022-06-03 DIAGNOSIS — Z79899 Other long term (current) drug therapy: Secondary | ICD-10-CM | POA: Insufficient documentation

## 2022-06-03 DIAGNOSIS — K5904 Chronic idiopathic constipation: Secondary | ICD-10-CM | POA: Diagnosis not present

## 2022-06-03 DIAGNOSIS — I25118 Atherosclerotic heart disease of native coronary artery with other forms of angina pectoris: Secondary | ICD-10-CM | POA: Diagnosis not present

## 2022-06-03 DIAGNOSIS — J9611 Chronic respiratory failure with hypoxia: Secondary | ICD-10-CM

## 2022-06-03 DIAGNOSIS — F411 Generalized anxiety disorder: Secondary | ICD-10-CM

## 2022-06-03 MED ORDER — SENNA-DOCUSATE SODIUM 8.6-50 MG PO TABS: 1.0000 | ORAL_TABLET | Freq: Every day | ORAL | 5 refills | Status: DC

## 2022-06-03 MED ORDER — GVOKE HYPOPEN 2-PACK 1 MG/0.2ML ~~LOC~~ SOAJ: 0.2000 mL | SUBCUTANEOUS | 5 refills | Status: DC | PRN

## 2022-06-03 NOTE — Patient Instructions (Signed)
Please start taking Senokot as needed for constipation.  Please continue taking other medications as prescribed.  Please continue to follow low carb diet and ambulate as tolerated.

## 2022-06-03 NOTE — Assessment & Plan Note (Signed)
Lab Results  Component Value Date   HGBA1C 10.9 02/03/2022    On Farxiga and Metformin Uncontrolled, but improving Had added Mounjaro, increased dose to 5 mg qw Has had episodes of hypoglycemia in the past, has CGM now, no recent episode of hypoglycemia Advised to follow diabetic diet Has statin intolerance F/u CMP and lipid panel Diabetic eye exam: Advised to follow up with Ophthalmology for diabetic eye exam  Takes gabapentin 800 mg QD, advised to take it twice daily as she has persistent numbness and tingling of bilateral LE

## 2022-06-03 NOTE — Assessment & Plan Note (Signed)
Due to COPD Followed by Pulmonology Has 3 lpm home O2 for chronic hypoxia

## 2022-06-03 NOTE — Assessment & Plan Note (Signed)
BP Readings from Last 1 Encounters:  06/03/22 (!) 136/58   Well-controlled with Imdur (for CAD/HFpEF) Counseled for compliance with the medications Advised DASH diet and moderate exercise/walking as tolerated

## 2022-06-03 NOTE — Assessment & Plan Note (Signed)
Takes Ativan 0.5 mg TID PRN On Cymbalta Refilled, PDMP reviewed

## 2022-06-03 NOTE — Progress Notes (Signed)
Established Patient Office Visit  Subjective:  Patient ID: Kayla Ryan, female    DOB: March 20, 1956  Age: 66 y.o. MRN: [id number]  CC:  Chief Complaint  Patient presents with   Follow-up    Follow up pt has been dealing with constipation since 05-31-22    HPI Kayla Ryan is a 66 y.o. female with past medical history of HTN, HFpEF, COPD, GERD, type II DM with neuropathy, CKD, GAD and chronic pain syndrome who presents for f/u of her chronic medical conditions.  Type II DM: She has started using Mounjaro, and has been tolerating it well.  Her blood glucose has been improving now.  She has a CGM.  Denies any recent episode of hypoglycemia. She has stopped taking [place] as she had urinary discomfort with it.  He has not blood glucose numbers are improving as per her CGM, she is advised to restart [place], she agrees. She has chronic numbness of bilateral foot.  She takes gabapentin 800 mg daily currently.  Denies any polyuria or polyphagia.  She has chronic fatigue.  GAD: She takes Ativan 0.5 mg 3 times daily as needed. She is also on Cymbalta.   She has history of COPD and chronic hypoxic respiratory failure.  She has acute Stiolto and as needed albuterol.  She uses 3 LPM O2 at home.  Her diarrhea has resolved now, but she complains of constipation now for the last 4 days.  She usually takes milk of magnesia for constipation.  She denies any melena or hematochezia currently.  Past Medical History:  Diagnosis Date   Allergy Unknown   Anemia    Anxiety 1883   Arthritis    Asthma    Blood transfusion without reported diagnosis 2012   Chest pain    Chronic bronchitis    Congestive heart failure (CHF) (HCC)    COPD (chronic obstructive pulmonary disease) (HCC)    Crohn disease ([place])    Diabetes mellitus without complication ([place]) 6/37/8588   Type II   Emphysema    Emphysema of lung (HCC)    Fibromyalgia    GERD (gastroesophageal reflux disease) 2980   Herpes     Hyperlipidemia    Hypertension 2015   IBS (irritable bowel syndrome)    Kidney failure    Migraines    Muscular dystrophy ([place])    Neck pain    Oxygen deficiency 2022   PAF (paroxysmal atrial fibrillation) ([place]) 05/01/2021   PAF with RVR in setting of severe anemia   Plantar fasciitis    Polymyositis (HCC)    PONV (postoperative nausea and vomiting)    Right thyroid nodule 04/07/2022   Scoliosis     Past Surgical History:  Procedure Laterality Date   ABDOMINAL HYSTERECTOMY     AGILE CAPSULE N/A 06/24/2021   Procedure: AGILE CAPSULE;  Surgeon: Eloise Harman, DO;  [facility];  Service: Endoscopy;  Laterality: N/A;   AGILE CAPSULE N/A 07/22/2021   Procedure: AGILE CAPSULE;  Surgeon: Daneil Dolin, MD;  [facility];  Service: Endoscopy;  Laterality: N/A;  7:30am   bone spur     BREAST SURGERY  1995   CHOLECYSTECTOMY     COLONOSCOPY WITH PROPOFOL N/A 04/30/2021   Procedure: COLONOSCOPY WITH PROPOFOL;  Surgeon: Daneil Dolin, MD;  [facility];  Service: Endoscopy;  Laterality: N/A;   ESOPHAGEAL DILATION N/A 04/30/2021   Procedure: ESOPHAGEAL DILATION;  Surgeon: Daneil Dolin, MD;  [facility];  Service: Endoscopy;  Laterality: N/A;   ESOPHAGOGASTRODUODENOSCOPY (EGD) WITH PROPOFOL N/A 04/30/2021   Procedure: ESOPHAGOGASTRODUODENOSCOPY (EGD) WITH PROPOFOL;  Surgeon: Daneil Dolin, MD;  [facility];  Service: Endoscopy;  Laterality: N/A;   GIVENS CAPSULE STUDY N/A 09/22/2021   Procedure: GIVENS CAPSULE STUDY;  Surgeon: Daneil Dolin, MD;  [facility];  Service: Endoscopy;  Laterality: N/A;  7:30am   HERNIA REPAIR     LEFT HEART CATHETERIZATION WITH CORONARY ANGIOGRAM N/A 11/07/2013   Procedure: LEFT HEART CATHETERIZATION WITH CORONARY ANGIOGRAM;  Surgeon: Lorretta Harp, MD;  Location[facility] CATH LAB;  Service: Cardiovascular;  Laterality: N/A;   MASTECTOMY PARTIAL / LUMPECTOMY     OOPHORECTOMY      ROTATOR CUFF REPAIR     TOOTH EXTRACTION N/A 04/15/2022   Procedure: DENTAL RESTORATION/EXTRACTIONS;  Surgeon: Diona Browner, DMD;  Location: [facility];  Service: Oral Surgery;  Laterality: N/A;    Family History  Problem Relation Age of Onset   Lung cancer Mother    Cancer Mother    Miscarriages / [place] Mother    Varicose Veins Mother    COPD Father    Lung cancer Father    Lymphoma Father    Alcohol abuse Father    Arthritis Father    Anxiety disorder Sister    Depression Brother    Anxiety disorder Sister     Social History   Socioeconomic History   Marital status: Widowed    Spouse name: Not on file   Number of children: Not on file   Years of education: Not on file   Highest education level: Not on file  Occupational History   Not on file  Tobacco Use   Smoking status: Former    Packs/day: 1.00    Years: 49.00    Total pack years: 49.00    Types: Cigarettes    Start date: 08/08/1966    Quit date: 08/31/2005    Years since quitting: 16.7   Smokeless tobacco: Never  Vaping Use   Vaping Use: Never used  Substance and Sexual Activity   Alcohol use: No   Drug use: No    Comment: used marijuana in teens   Sexual activity: Not Currently    Birth control/protection: Surgical  Other Topics Concern   Not on file  Social History Narrative   Not on file   Social Determinants of Health   Financial Resource Strain: Low Risk  (03/14/2022)   Overall Financial Resource Strain (CARDIA)    Difficulty of Paying Living Expenses: Not hard at all  Food Insecurity: No Food Insecurity (03/14/2022)   Hunger Vital Sign    Worried About Running Out of Food in the Last Year: Never true    [place] in the Last Year: Never true  Transportation Needs: No Transportation Needs (03/14/2022)   PRAPARE - Hydrologist (Medical): No    Lack of Transportation (Non-Medical): No  Physical Activity: Inactive (03/14/2022)   Exercise Vital Sign    Days of Exercise  per Week: 0 days    Minutes of Exercise per Session: 0 min  Stress: No Stress Concern Present (03/14/2022)   [place]    Feeling of Stress : Not at all  Social Connections: Socially Isolated (03/14/2022)   Social Connection and Isolation Panel [NHANES]    Frequency of Communication with Friends and Family: More than three times a  week    Frequency of Social Gatherings with Friends and Family: Twice a week    Attends Religious Services: Never    Marine scientist or Organizations: No    Attends Archivist Meetings: Never    Marital Status: Widowed  Intimate Partner Violence: Not At Risk (03/14/2022)   Humiliation, Afraid, Rape, and Kick questionnaire    Fear of Current or Ex-Partner: No    Emotionally Abused: No    Physically Abused: No    Sexually Abused: No    Outpatient Medications Prior to Visit  Medication Sig Dispense Refill   acyclovir (ZOVIRAX) 400 MG tablet Take 400 mg by mouth every evening.     blood glucose meter kit and supplies Dispense based on patient and insurance preference. Once daily testing DX E11.9 1 each 0   cetirizine (ZYRTEC) 10 MG tablet Take 10 mg by mouth 2 (two) times daily.     Cholecalciferol (VITAMIN D3) 50 MCG (2000 UT) capsule Take 2,000 Units by mouth daily.     Continuous Blood Gluc Receiver (DEXCOM G7 RECEIVER) DEVI To check blood glucose PRN 1 each 0   Continuous Blood Gluc Sensor (DEXCOM G7 SENSOR) MISC Check blood glucose at least 3 times before meals and at bedtime, and as needed. 3 each 5   dicyclomine (BENTYL) 10 MG capsule Take 1 capsule (10 mg total) by mouth 4 (four) times daily -  before meals and at bedtime. Monitor for constipation, dry mouth, dizziness (Patient taking differently: Take 10 mg by mouth 4 (four) times daily as needed (Bowel Spasm/Diarrhea). Monitor for constipation, dry mouth, dizziness) 120 capsule 3   diphenhydrAMINE (BENADRYL) 25 MG tablet Take  25 mg by mouth every 4 (four) hours as needed for allergies or itching.     doxepin (SINEQUAN) 10 MG capsule Take 10 mg by mouth at bedtime.     DULoxetine (CYMBALTA) 60 MG capsule Take 1 capsule (60 mg total) by mouth 2 (two) times daily. (Patient taking differently: Take 60 mg by mouth every evening.) 60 capsule 3   FARXIGA 10 MG TABS tablet Take 1 tablet (10 mg total) by mouth daily. 30 tablet 5   gabapentin (NEURONTIN) 800 MG tablet Take 800 mg by mouth 3 (three) times daily.     gemfibrozil (LOPID) 600 MG tablet Take 1 tablet (600 mg total) by mouth 2 (two) times daily before a meal. 180 tablet 1   Glycerin-Hypromellose-PEG 400 (DRY EYE RELIEF DROPS OP) Place 1 drop into both eyes 3 (three) times daily as needed (Dry eye).     HYDROcodone-acetaminophen (NORCO) 10-325 MG tablet Take 1 tablet by mouth every 4 (four) hours as needed. Stop Hydrocodone 7.5/325mg tablet. 180 tablet 0   hydrocortisone (ANUSOL-HC) 2.5 % rectal cream Place 1 Application rectally 2 (two) times daily. (Patient taking differently: Place 1 Application rectally 2 (two) times daily as needed for hemorrhoids or anal itching.) 30 g 1   ipratropium (ATROVENT HFA) 17 MCG/ACT inhaler Inhale 2 puffs into the lungs every 4 (four) hours as needed (COPD).     isosorbide dinitrate (ISORDIL) 30 MG tablet TAKE 1 TABLET(30 MG) BY MOUTH DAILY 90 tablet 1   ketoconazole (NIZORAL) 2 % cream Apply 1 Application topically daily. 30 g 0   LORazepam (ATIVAN) 0.5 MG tablet TAKE 1 TABLET(0.5 MG) BY MOUTH THREE TIMES DAILY AS NEEDED FOR ANXIETY 90 tablet 2   meclizine (ANTIVERT) 25 MG tablet TAKE 1 TABLET(25 MG) BY MOUTH THREE TIMES DAILY AS NEEDED  FOR DIZZINESS 30 tablet 0   metFORMIN (GLUCOPHAGE) 500 MG tablet Take 1 tablet (500 mg total) by mouth 2 (two) times daily with a meal. 60 tablet 5   methocarbamol (ROBAXIN) 500 MG tablet Take 1 tablet (500 mg total) by mouth every 8 (eight) hours as needed for muscle spasms. 30 tablet 1   montelukast  (SINGULAIR) 10 MG tablet Take 10 mg by mouth at bedtime.     NITROSTAT 0.4 MG SL tablet Place 0.4 mg under the tongue every 15 (fifteen) minutes as needed for chest pain.      omeprazole (PRILOSEC) 40 MG capsule Take 40 mg by mouth 2 (two) times daily.     ondansetron (ZOFRAN-ODT) 4 MG disintegrating tablet Take 1 tablet (4 mg total) by mouth every 6 (six) hours as needed for nausea. 20 tablet 0   simethicone (GAS-X) 80 MG chewable tablet Chew 1 tablet (80 mg total) by mouth every 6 (six) hours as needed for flatulence. 30 tablet 0   Tiotropium Bromide-Olodaterol (STIOLTO RESPIMAT) 2.5-2.5 MCG/ACT AERS Inhale 2 each into the lungs daily.     tirzepatide [facility]) 5 MG/0.5ML Pen Inject 5 mg into the skin once a week. 6 mL 1   vitamin B-12 (CYANOCOBALAMIN) 100 MCG tablet Take 100 mcg by mouth daily.     clindamycin (CLEOCIN) 300 MG capsule Take 1 capsule (300 mg total) by mouth 3 (three) times daily. 21 capsule 0   No facility-administered medications prior to visit.    Allergies  Allergen Reactions   Sulfa Antibiotics Shortness Of Breath, Swelling and Rash   Clindamycin/Lincomycin    Dilaudid [Hydromorphone Hcl]     Makes me crazy   Iron     Throat starts closing up, tongue swells, severe headaches and backpain   Metronidazole Diarrhea and Nausea And Vomiting   Prednisone Other (See Comments)    Angry     Statins Other (See Comments)    Unknown    Cephalexin Diarrhea and Nausea And Vomiting   Methotrexate Derivatives Swelling and Rash   Morphine And Related Anxiety    "exreme mood swings"    ROS Review of Systems  Constitutional:  Positive for fatigue. Negative for chills and fever.  HENT:  Negative for congestion, sinus pressure, sinus pain and sore throat.   Eyes:  Negative for pain and discharge.  Respiratory:  Positive for shortness of breath. Negative for cough.   Cardiovascular:  Negative for chest pain and palpitations.  Gastrointestinal:  Positive for constipation.  Negative for abdominal pain, nausea and vomiting.  Endocrine: Negative for polydipsia and polyuria.  Genitourinary:  Negative for dysuria and hematuria.  Musculoskeletal:  Positive for arthralgias, back pain, gait problem and neck pain. Negative for neck stiffness.  Skin:  Negative for rash.  Neurological:  Positive for dizziness. Negative for weakness.  Psychiatric/Behavioral:  Positive for sleep disturbance. Negative for agitation and behavioral problems. The patient is nervous/anxious.       Objective:    Physical Exam Vitals reviewed.  Constitutional:      General: She is not in acute distress.    Appearance: She is not diaphoretic.  HENT:     Head: Normocephalic and atraumatic.     Nose: Nose normal.     Mouth/Throat:     Mouth: Mucous membranes are moist.  Eyes:     General: No scleral icterus.    Extraocular Movements: Extraocular movements intact.  Cardiovascular:     Rate and Rhythm: Normal rate and regular rhythm.  Pulses: Normal pulses.     Heart sounds: Normal heart sounds. No murmur heard. Pulmonary:     Breath sounds: Normal breath sounds. No wheezing or rales.     Comments: On 3 LPM O2 Musculoskeletal:     Cervical back: Neck supple. No tenderness.     Right lower leg: No edema.     Left lower leg: No edema.  Skin:    General: Skin is warm.     Findings: No rash.  Neurological:     General: No focal deficit present.     Mental Status: She is alert and oriented to person, place, and time.     Sensory: Sensory deficit present.     Motor: Weakness (4/5 in b/l UE and LE) present.  Psychiatric:        Mood and Affect: Mood normal.        Behavior: Behavior normal.     BP (!) 136/58 (BP Location: Left Arm, Patient Position: Sitting, Cuff Size: Normal)   Pulse 100   Resp 18   Ht _0  (1.727 m)   Wt 156 lb 12.8 oz (71.1 kg)   SpO2 92%   BMI 23.84 kg/m  Wt Readings from Last 3 Encounters:  06/03/22 156 lb 12.8 oz (71.1 kg)  04/15/22 170 lb (77.1  kg)  04/13/22 170 lb 12.8 oz (77.5 kg)    Lab Results  Component Value Date   TSH 3.918 05/01/2021   Lab Results  Component Value Date   WBC 11.4 (H) 05/18/2022   HGB 9.5 (L) 05/18/2022   HCT 32.0 (L) 05/18/2022   MCV 82.3 05/18/2022   PLT 369 05/18/2022   Lab Results  Component Value Date   NA 140 04/15/2022   K 3.9 04/15/2022   CO2 27 04/15/2022   GLUCOSE 97 04/15/2022   BUN 28 (H) 04/15/2022   CREATININE 1.36 (H) 04/15/2022   BILITOT 0.5 06/23/2021   ALKPHOS 96 06/23/2021   AST 25 06/23/2021   ALT 14 06/23/2021   PROT 7.7 06/23/2021   ALBUMIN 4.1 06/23/2021   CALCIUM 10.0 04/15/2022   ANIONGAP 13 04/15/2022   GFR 58.40 (L) 01/28/2021   No results found for: "CHOL" No results found for: "HDL" No results found for: "LDLCALC" No results found for: "TRIG" No results found for: "CHOLHDL" Lab Results  Component Value Date   HGBA1C 10.9 02/03/2022      Assessment & Plan:   Problem List Items Addressed This Visit       Cardiovascular and Mediastinum   Essential hypertension - Primary    BP Readings from Last 1 Encounters:  06/03/22 (!) 136/58  Well-controlled with Imdur (for CAD/HFpEF) Counseled for compliance with the medications Advised DASH diet and moderate exercise/walking as tolerated        Respiratory   Chronic respiratory failure with hypoxia (HCC)    Due to COPD Followed by Pulmonology Has 3 lpm home O2 for chronic hypoxia        Digestive   Chronic idiopathic constipation    Started Senokot D      Relevant Medications   sennosides-docusate sodium (SENOKOT-S) 8.6-50 MG tablet     Endocrine   Type 2 diabetes mellitus with neurological complications ([place])    Lab Results  Component Value Date   HGBA1C 10.9 02/03/2022   On Farxiga and Metformin Uncontrolled, but improving Had added Mounjaro, increased dose to 5 mg qw Has had episodes of hypoglycemia in the past, has CGM now, no recent  episode of hypoglycemia Advised to follow  diabetic diet Has statin intolerance F/u CMP and lipid panel Diabetic eye exam: Advised to follow up with Ophthalmology for diabetic eye exam  Takes gabapentin 800 mg QD, advised to take it twice daily as she has persistent numbness and tingling of bilateral LE      Relevant Medications   Glucagon (GVOKE HYPOPEN 2-PACK) 1 MG/0.2ML SOAJ   Other Relevant Orders   CMP14+EGFR   Hemoglobin A1c   Urine Microalbumin w/creat. ratio   Right thyroid nodule     Other   GAD (generalized anxiety disorder)    Takes Ativan 0.5 mg TID PRN On Cymbalta Refilled, PDMP reviewed      Relevant Orders   ToxASSURE Select 13 (MW), Urine   Chronic prescription benzodiazepine use   Relevant Orders   ToxASSURE Select 13 (MW), Urine   Other Visit Diagnoses     Coronary artery disease of native artery of native heart with stable angina pectoris ([place])       Relevant Orders   Lipid Profile   Need for hepatitis C screening test       Relevant Orders   Hepatitis C Antibody   Hypoglycemia       Relevant Medications   Glucagon (GVOKE HYPOPEN 2-PACK) 1 MG/0.2ML SOAJ   Need for immunization against influenza       Relevant Orders   Flu Vaccine QUAD High Dose(Fluad) (Completed)       Meds ordered this encounter  Medications   Glucagon (GVOKE HYPOPEN 2-PACK) 1 MG/0.2ML SOAJ    Sig: Inject 0.2 mLs into the skin as needed (Blood glucose < 53).    Dispense:  0.4 mL    Refill:  5   sennosides-docusate sodium (SENOKOT-S) 8.6-50 MG tablet    Sig: Take 1 tablet by mouth daily.    Dispense:  30 tablet    Refill:  5    Follow-up: Return in about 3 months (around 09/03/2022) for DM and GAD.    Lindell Spar, MD

## 2022-06-03 NOTE — Assessment & Plan Note (Signed)
Started Senokot D

## 2022-06-06 ENCOUNTER — Other Ambulatory Visit: Payer: Self-pay | Admitting: *Deleted

## 2022-06-06 DIAGNOSIS — M47816 Spondylosis without myelopathy or radiculopathy, lumbar region: Secondary | ICD-10-CM | POA: Diagnosis not present

## 2022-06-06 DIAGNOSIS — M5030 Other cervical disc degeneration, unspecified cervical region: Secondary | ICD-10-CM

## 2022-06-06 DIAGNOSIS — Z79891 Long term (current) use of opiate analgesic: Secondary | ICD-10-CM | POA: Diagnosis not present

## 2022-06-06 DIAGNOSIS — G894 Chronic pain syndrome: Secondary | ICD-10-CM | POA: Diagnosis not present

## 2022-06-06 DIAGNOSIS — M797 Fibromyalgia: Secondary | ICD-10-CM | POA: Diagnosis not present

## 2022-06-06 LAB — MICROALBUMIN / CREATININE URINE RATIO
Creatinine, Urine: 33.7 mg/dL
Microalb/Creat Ratio: <9 mg/g creat (ref 0–29)
Microalbumin, Urine: <3 ug/mL

## 2022-06-06 LAB — CMP14+EGFR
ALT: 49 IU/L — ABNORMAL HIGH (ref 0–32)
AST: 35 IU/L (ref 0–40)
Albumin/Globulin Ratio: 1.9 (ref 1.2–2.2)
Albumin: 4.9 g/dL (ref 3.9–4.9)
Alkaline Phosphatase: 111 IU/L (ref 44–121)
BUN/Creatinine Ratio: 16 (ref 12–28)
BUN: 18 mg/dL (ref 8–27)
Bilirubin Total: 0.2 mg/dL (ref 0.0–1.2)
CO2: 24 mmol/L (ref 20–29)
Calcium: 10.7 mg/dL — ABNORMAL HIGH (ref 8.7–10.3)
Chloride: 95 mmol/L — ABNORMAL LOW (ref 96–106)
Creatinine, Ser: 1.11 mg/dL — ABNORMAL HIGH (ref 0.57–1.00)
Globulin, Total: 2.6 g/dL (ref 1.5–4.5)
Glucose: 83 mg/dL (ref 70–99)
Potassium: 5.3 mmol/L — ABNORMAL HIGH (ref 3.5–5.2)
Sodium: 137 mmol/L (ref 134–144)
Total Protein: 7.5 g/dL (ref 6.0–8.5)
eGFR: 54 mL/min/{1.73_m2} — ABNORMAL LOW (ref >59)

## 2022-06-06 LAB — HEMOGLOBIN A1C
Est. average glucose Bld gHb Est-mCnc: 114 mg/dL
Hgb A1c MFr Bld: 5.6 % (ref 4.8–5.6)

## 2022-06-06 LAB — LIPID PANEL
Chol/HDL Ratio: 3.3 ratio (ref 0.0–4.4)
Cholesterol, Total: 189 mg/dL (ref 100–199)
HDL: 57 mg/dL (ref >39)
LDL Chol Calc (NIH): 107 mg/dL — ABNORMAL HIGH (ref 0–99)
Triglycerides: 141 mg/dL (ref 0–149)
VLDL Cholesterol Cal: 25 mg/dL (ref 5–40)

## 2022-06-06 LAB — HEPATITIS C ANTIBODY: Hep C Virus Ab: NONREACTIVE

## 2022-06-06 MED ORDER — METHOCARBAMOL 500 MG PO TABS: 500.0000 mg | ORAL_TABLET | Freq: Three times a day (TID) | ORAL | 1 refills | Status: DC | PRN

## 2022-06-08 ENCOUNTER — Inpatient Hospital Stay: Payer: Medicare Other

## 2022-06-09 ENCOUNTER — Telehealth: Payer: Self-pay | Admitting: Internal Medicine

## 2022-06-09 ENCOUNTER — Encounter: Payer: Self-pay | Admitting: Family Medicine

## 2022-06-09 ENCOUNTER — Ambulatory Visit (INDEPENDENT_AMBULATORY_CARE_PROVIDER_SITE_OTHER): Payer: Medicare Other | Admitting: Family Medicine

## 2022-06-09 DIAGNOSIS — K529 Noninfective gastroenteritis and colitis, unspecified: Secondary | ICD-10-CM

## 2022-06-09 LAB — TOXASSURE SELECT 13 (MW), URINE

## 2022-06-09 MED ORDER — ONDANSETRON HCL 4 MG PO TABS: 4.0000 mg | ORAL_TABLET | Freq: Three times a day (TID) | ORAL | 0 refills | Status: DC | PRN

## 2022-06-09 NOTE — Patient Instructions (Addendum)
F/u with Dr Posey Pronto as before  Letter  documenting acute illness preventing Kayla Ryan from being able to travel to attend concert in [place] to be provided with Practice name etc , she needs this to send to her inds to get ticket refund, pls contact her when done  Zofran is prescribed

## 2022-06-09 NOTE — Telephone Encounter (Signed)
Pt called stating she received results on mychart about a urine drug test results that are making worried. States some of the things that came back + she does not take. Wants to know if you can please give her a call?

## 2022-06-09 NOTE — Progress Notes (Signed)
Virtual Visit via Telephone Note  I connected with VASILISA VORE on 06/09/22 at  1:00 PM EDT by telephone and verified that I am speaking with the correct person using two identifiers.  Location: Patient: home Provider: office   I discussed the limitations, risks, security and privacy concerns of performing an evaluation and management service by telephone and the availability of in person appointments. I also discussed with the patient that there may be a patient responsible charge related to this service. The patient expressed understanding and agreed to proceed.   History of Present Illness:    2 day h/o loose watery stool up to 6 this morning, no vomit, but nauseated, stool is getting more formed, and she does have lomotil No fevr or chills, no one else affected Was to attend concert in [place] , unable due to acute illness and needs letter from medical office to support this for ins to refund her $$ Observations/Objective:  Good communication with no confusion and intact memory. Alert and oriented x 3 No signs of respiratory distress during speech  Assessment and Plan:  Acute gastroenteritis  Day history, disabling, symptomatic treatment, zofran, which she needs rx sent , has bentyl which is helping and does not need lomotil, stool is becoming more formed BRAT diet Excuse from concert for refund of cash  Follow Up Instructions:    I discussed the assessment and treatment plan with the patient. The patient was provided an opportunity to ask questions and all were answered. The patient agreed with the plan and demonstrated an understanding of the instructions.   The patient was advised to call back or seek an in-person evaluation if the symptoms worsen or if the condition fails to improve as anticipated.  I provided 8 minutes of non-face-to-face time during this encounter.   Tula Nakayama, MD

## 2022-06-09 NOTE — Assessment & Plan Note (Signed)
Day history, disabling, symptomatic treatment, zofran, which she needs rx sent , has bentyl which is helping and does not need lomotil, stool is becoming more formed BRAT diet Excuse from concert for refund of cash

## 2022-06-09 NOTE — Telephone Encounter (Signed)
Pt advised with verbal understanding  °

## 2022-06-13 ENCOUNTER — Telehealth: Payer: Self-pay | Admitting: Internal Medicine

## 2022-06-13 NOTE — Telephone Encounter (Signed)
Patient called in regard to a note that was discussed on last tele visit (11/2)  for ticket master to be refund money due to being sick and unable to attend event.   Patient wants a call back in regard to letter and also has a question in regard to diabetes.

## 2022-06-15 ENCOUNTER — Inpatient Hospital Stay: Payer: Medicare Other | Attending: Hematology

## 2022-06-15 ENCOUNTER — Telehealth: Payer: Self-pay | Admitting: Internal Medicine

## 2022-06-15 VITALS — BP 158/56 | HR 68 | Temp 98.2°F | Resp 19

## 2022-06-15 DIAGNOSIS — D509 Iron deficiency anemia, unspecified: Secondary | ICD-10-CM | POA: Diagnosis not present

## 2022-06-15 MED ORDER — LORATADINE 10 MG PO TABS
10.0000 mg | ORAL_TABLET | Freq: Once | ORAL | Status: AC
  Administered 2022-06-15: 10 mg via ORAL
  Filled 2022-06-15: qty 1

## 2022-06-15 MED ORDER — ACETAMINOPHEN 325 MG PO TABS
650.0000 mg | ORAL_TABLET | Freq: Once | ORAL | Status: AC
  Administered 2022-06-15: 650 mg via ORAL
  Filled 2022-06-15: qty 2

## 2022-06-15 MED ORDER — CETIRIZINE HCL 10 MG/ML IV SOLN: 10.0000 mg | Freq: Once | INTRAVENOUS | Status: DC

## 2022-06-15 MED ORDER — SODIUM CHLORIDE 0.9 % IV SOLN: 8.0000 mg | Freq: Once | INTRAVENOUS | Status: DC

## 2022-06-15 MED ORDER — SODIUM CHLORIDE 0.9 % IV SOLN: Freq: Once | INTRAVENOUS | Status: AC

## 2022-06-15 MED ORDER — ONDANSETRON HCL 4 MG/2ML IJ SOLN
8.0000 mg | Freq: Once | INTRAMUSCULAR | Status: AC
  Administered 2022-06-15: 8 mg via INTRAVENOUS
  Filled 2022-06-15: qty 4

## 2022-06-15 MED ORDER — SODIUM CHLORIDE 0.9 % IV SOLN
90.0000 mg | Freq: Once | INTRAVENOUS | Status: AC
  Administered 2022-06-15: 90 mg via INTRAVENOUS
  Filled 2022-06-15: qty 3

## 2022-06-15 MED ORDER — FAMOTIDINE IN NACL 20-0.9 MG/50ML-% IV SOLN
20.0000 mg | Freq: Once | INTRAVENOUS | Status: AC
  Administered 2022-06-15: 20 mg via INTRAVENOUS
  Filled 2022-06-15: qty 50

## 2022-06-15 NOTE — Progress Notes (Signed)
Patient presents today for Feraheme infusion. Vital signs are stable. Patient has no complaints today of any significant changes since her last office visit.   15:04 pm Patient complains of feeling shaky and states she is a diabetic and hasn't ate since 11:00 am. Patient's sugar 108 on dexcom 7. Patient instructed to eat a snack.   Feraheme given today per MD orders. Tolerated infusion without adverse affects. Vital signs stable. No complaints at this time. Discharged from clinic ambulatory in stable condition. Alert and oriented x 3. F/U with [facility] as scheduled.

## 2022-06-15 NOTE — Patient Instructions (Signed)
[facility]  Discharge Instructions: Thank you for choosing [facility] to provide your oncology and hematology care.  If you have a lab appointment with the [facility], please come in thru the Main Entrance and check in at the main information desk.  Wear comfortable clothing and clothing appropriate for easy access to any Portacath or PICC line.   We strive to give you quality time with your provider. You may need to reschedule your appointment if you arrive late (15 or more minutes).  Arriving late affects you and other patients whose appointments are after yours.  Also, if you miss three or more appointments without notifying the office, you may be dismissed from the clinic at the provider's discretion.      For prescription refill requests, have your pharmacy contact our office and allow 72 hours for refills to be completed.    Today you received the following chemotherapy and/or immunotherapy agents Feraheme.  Ferumoxytol Injection What is this medication? FERUMOXYTOL (FER ue MOX i tol) treats low levels of iron in your body (iron deficiency anemia). Iron is a mineral that plays an important role in making red blood cells, which carry oxygen from your lungs to the rest of your body. This medicine may be used for other purposes; ask your health care provider or pharmacist if you have questions. COMMON BRAND NAME(S): Feraheme What should I tell my care team before I take this medication? They need to know if you have any of these conditions: Anemia not caused by low iron levels High levels of iron in the blood Magnetic resonance imaging (MRI) test scheduled An unusual or allergic reaction to iron, other medications, foods, dyes, or preservatives Pregnant or trying to get pregnant Breastfeeding How should I use this medication? This medication is injected into a vein. It is given by your care team in a hospital or clinic setting. Talk to your care  team the use of this medication in children. Special care may be needed. Overdosage: If you think you have taken too much of this medicine contact a poison control center or emergency room at once. NOTE: This medicine is only for you. Do not share this medicine with others. What if I miss a dose? It is important not to miss your dose. Call your care team if you are unable to keep an appointment. What may interact with this medication? Other iron products This list may not describe all possible interactions. Give your health care provider a list of all the medicines, herbs, non-prescription drugs, or dietary supplements you use. Also tell them if you smoke, drink alcohol, or use illegal drugs. Some items may interact with your medicine. What should I watch for while using this medication? Visit your care team regularly. Tell your care team if your symptoms do not start to get better or if they get worse. You may need blood work done while you are taking this medication. You may need to follow a special diet. Talk to your care team. Foods that contain iron include: whole grains/cereals, dried fruits, beans, or peas, leafy green vegetables, and organ meats (liver, kidney). What side effects may I notice from receiving this medication? Side effects that you should report to your care team as soon as possible: Allergic reactions--skin rash, itching, hives, swelling of the face, lips, tongue, or throat Low blood pressure--dizziness, feeling faint or lightheaded, blurry vision Shortness of breath Side effects that usually do not require medical attention (report to  your care team if they continue or are bothersome): Flushing Headache Joint pain Muscle pain Nausea Pain, redness, or irritation at injection site This list may not describe all possible side effects. Call your doctor for medical advice about side effects. You may report side effects to FDA at 1-800-FDA-1088. Where should I keep my  medication? This medication is given in a hospital or clinic and will not be stored at home. NOTE: This sheet is a summary. It may not cover all possible information. If you have questions about this medicine, talk to your doctor, pharmacist, or health care provider.  2023 Elsevier/Gold Standard (2020-12-16 00:00:00)       To help prevent nausea and vomiting after your treatment, we encourage you to take your nausea medication as directed.  BELOW ARE SYMPTOMS THAT SHOULD BE REPORTED IMMEDIATELY: *FEVER GREATER THAN 100.4 F (38 C) OR HIGHER *CHILLS OR SWEATING *NAUSEA AND VOMITING THAT IS NOT CONTROLLED WITH YOUR NAUSEA MEDICATION *UNUSUAL SHORTNESS OF BREATH *UNUSUAL BRUISING OR BLEEDING *URINARY PROBLEMS (pain or burning when urinating, or frequent urination) *BOWEL PROBLEMS (unusual diarrhea, constipation, pain near the anus) TENDERNESS IN MOUTH AND THROAT WITH OR WITHOUT PRESENCE OF ULCERS (sore throat, sores in mouth, or a toothache) UNUSUAL RASH, SWELLING OR PAIN  UNUSUAL VAGINAL DISCHARGE OR ITCHING   Items with * indicate a potential emergency and should be followed up as soon as possible or go to the Emergency Department if any problems should occur.  Please show the CHEMOTHERAPY ALERT CARD or IMMUNOTHERAPY ALERT CARD at check-in to the Emergency Department and triage nurse.  Should you have questions after your visit or need to cancel or reschedule your appointment, please contact [facility] [phone]  and follow the prompts.  Office hours are 8:00 a.m. to 4:30 p.m. Monday - Friday. Please note that voicemails left after 4:00 p.m. may not be returned until the following business day.  We are closed weekends and major holidays. You have access to a nurse at all times for urgent questions. Please call the main number to the clinic [phone] and follow the prompts.  For any non-urgent questions, you may also contact your provider using MyChart. We now  offer e-Visits for anyone 47 and older to request care online for non-urgent symptoms. For details visit [email or web address].   Also download the MyChart app! Go to the app store, search "MyChart", open the app, select [facility], and log in with your MyChart username and password.  Masks are optional in the cancer centers. If you would like for your care team to wear a mask while they are taking care of you, please let them know. You may have one support person who is at least 68 years old accompany you for your appointments.

## 2022-06-15 NOTE — Progress Notes (Signed)
For today's first Feraheme dose:  Pharmacy does not carry IV cetirizine, ok to substitute loratidine 10 mg orally x 1  Feraheme infusion - ok to administer 90 mg and infuse over 30 minutes.  T.OTarri Abernethy, PA-C/[facility] Ronnald Ramp, PharmD

## 2022-06-15 NOTE — Telephone Encounter (Signed)
Patient aware.

## 2022-06-15 NOTE — Telephone Encounter (Signed)
Pt called asking about an update for the letter that was supposed to be written so she can get a refund on the concert tickets?   Also states her hair is falling out in clumps. Wants to know what to do?

## 2022-06-17 ENCOUNTER — Other Ambulatory Visit: Payer: Self-pay | Admitting: Internal Medicine

## 2022-06-17 DIAGNOSIS — R42 Dizziness and giddiness: Secondary | ICD-10-CM

## 2022-06-20 ENCOUNTER — Ambulatory Visit (HOSPITAL_COMMUNITY): Payer: Medicare Other

## 2022-06-22 ENCOUNTER — Telehealth: Payer: Self-pay | Admitting: *Deleted

## 2022-06-22 ENCOUNTER — Inpatient Hospital Stay: Payer: Medicare Other

## 2022-06-22 DIAGNOSIS — J9611 Chronic respiratory failure with hypoxia: Secondary | ICD-10-CM | POA: Diagnosis not present

## 2022-06-22 DIAGNOSIS — J449 Chronic obstructive pulmonary disease, unspecified: Secondary | ICD-10-CM | POA: Diagnosis not present

## 2022-06-22 NOTE — Telephone Encounter (Signed)
Patient called to advise that she had severe diarrhea and nausea following last iron infusion, which she experiences with each treatment.  States it was worse this time and lasted 5 days.  Suggested that she medicate with her zofran and milk of magnesia after next treatment rather than waiting several days to avoid dehydration and unpleasant side effects.  Verbalized understanding.

## 2022-06-29 ENCOUNTER — Other Ambulatory Visit: Payer: Self-pay | Admitting: Internal Medicine

## 2022-06-29 ENCOUNTER — Inpatient Hospital Stay: Payer: Medicare Other

## 2022-06-29 DIAGNOSIS — M5030 Other cervical disc degeneration, unspecified cervical region: Secondary | ICD-10-CM

## 2022-07-04 ENCOUNTER — Ambulatory Visit (HOSPITAL_COMMUNITY): Payer: Medicare Other

## 2022-07-05 ENCOUNTER — Other Ambulatory Visit (HOSPITAL_COMMUNITY): Payer: Self-pay

## 2022-07-05 ENCOUNTER — Other Ambulatory Visit: Payer: Self-pay | Admitting: Internal Medicine

## 2022-07-05 MED ORDER — DOXEPIN HCL 10 MG PO CAPS
10.0000 mg | ORAL_CAPSULE | Freq: Every day | ORAL | 3 refills | Status: DC
  Filled 2022-07-05: qty 30, 30d supply, fill #0

## 2022-07-06 ENCOUNTER — Other Ambulatory Visit (HOSPITAL_COMMUNITY): Payer: Self-pay

## 2022-07-11 ENCOUNTER — Inpatient Hospital Stay: Payer: Medicare Other

## 2022-07-11 ENCOUNTER — Ambulatory Visit: Payer: Medicare Other | Admitting: Internal Medicine

## 2022-07-13 ENCOUNTER — Ambulatory Visit: Payer: Medicare Other | Admitting: Internal Medicine

## 2022-07-15 ENCOUNTER — Ambulatory Visit: Payer: Medicare Other | Admitting: Internal Medicine

## 2022-07-15 ENCOUNTER — Other Ambulatory Visit: Payer: Medicare Other

## 2022-07-18 ENCOUNTER — Inpatient Hospital Stay: Payer: Medicare Other

## 2022-07-19 ENCOUNTER — Other Ambulatory Visit: Payer: Self-pay | Admitting: Internal Medicine

## 2022-07-19 DIAGNOSIS — R110 Nausea: Secondary | ICD-10-CM

## 2022-07-19 DIAGNOSIS — R42 Dizziness and giddiness: Secondary | ICD-10-CM

## 2022-07-19 DIAGNOSIS — J439 Emphysema, unspecified: Secondary | ICD-10-CM

## 2022-07-19 MED ORDER — ONDANSETRON HCL 4 MG PO TABS: 4.0000 mg | ORAL_TABLET | Freq: Three times a day (TID) | ORAL | 0 refills | Status: DC | PRN

## 2022-07-19 MED ORDER — STIOLTO RESPIMAT 2.5-2.5 MCG/ACT IN AERS: 2.0000 | INHALATION_SPRAY | Freq: Every day | RESPIRATORY_TRACT | 11 refills | Status: DC

## 2022-07-19 MED ORDER — MECLIZINE HCL 25 MG PO TABS: 25.0000 mg | ORAL_TABLET | Freq: Three times a day (TID) | ORAL | 0 refills | Status: DC | PRN

## 2022-07-22 ENCOUNTER — Ambulatory Visit: Payer: Medicare Other | Admitting: Physician Assistant

## 2022-07-22 DIAGNOSIS — J9611 Chronic respiratory failure with hypoxia: Secondary | ICD-10-CM | POA: Diagnosis not present

## 2022-07-22 DIAGNOSIS — J449 Chronic obstructive pulmonary disease, unspecified: Secondary | ICD-10-CM | POA: Diagnosis not present

## 2022-07-25 ENCOUNTER — Encounter (HOSPITAL_COMMUNITY): Payer: Self-pay

## 2022-07-25 ENCOUNTER — Ambulatory Visit (HOSPITAL_COMMUNITY): Payer: Medicare Other

## 2022-07-26 ENCOUNTER — Inpatient Hospital Stay: Payer: Medicare Other

## 2022-07-28 ENCOUNTER — Ambulatory Visit: Payer: Medicare Other | Admitting: Emergency Medicine

## 2022-07-31 ENCOUNTER — Other Ambulatory Visit: Payer: Self-pay | Admitting: Internal Medicine

## 2022-07-31 DIAGNOSIS — R42 Dizziness and giddiness: Secondary | ICD-10-CM

## 2022-07-31 DIAGNOSIS — R110 Nausea: Secondary | ICD-10-CM

## 2022-08-03 ENCOUNTER — Ambulatory Visit: Payer: Medicare Other

## 2022-08-03 ENCOUNTER — Other Ambulatory Visit: Payer: Medicare Other

## 2022-08-03 DIAGNOSIS — M797 Fibromyalgia: Secondary | ICD-10-CM | POA: Diagnosis not present

## 2022-08-03 DIAGNOSIS — Z79891 Long term (current) use of opiate analgesic: Secondary | ICD-10-CM | POA: Diagnosis not present

## 2022-08-03 DIAGNOSIS — M47816 Spondylosis without myelopathy or radiculopathy, lumbar region: Secondary | ICD-10-CM | POA: Diagnosis not present

## 2022-08-03 DIAGNOSIS — G894 Chronic pain syndrome: Secondary | ICD-10-CM | POA: Diagnosis not present

## 2022-08-04 ENCOUNTER — Inpatient Hospital Stay: Payer: Medicare Other | Attending: Hematology

## 2022-08-04 VITALS — BP 104/46 | HR 73 | Temp 98.3°F | Resp 18

## 2022-08-04 DIAGNOSIS — D509 Iron deficiency anemia, unspecified: Secondary | ICD-10-CM | POA: Insufficient documentation

## 2022-08-04 MED ORDER — SODIUM CHLORIDE 0.9 % IV SOLN
510.0000 mg | Freq: Once | INTRAVENOUS | Status: AC
  Administered 2022-08-04: 510 mg via INTRAVENOUS
  Filled 2022-08-04: qty 17

## 2022-08-04 MED ORDER — ACETAMINOPHEN 325 MG PO TABS
650.0000 mg | ORAL_TABLET | Freq: Once | ORAL | Status: AC
  Administered 2022-08-04: 650 mg via ORAL
  Filled 2022-08-04: qty 2

## 2022-08-04 MED ORDER — CETIRIZINE HCL 10 MG/ML IV SOLN
10.0000 mg | Freq: Once | INTRAVENOUS | Status: AC
  Administered 2022-08-04: 10 mg via INTRAVENOUS
  Filled 2022-08-04: qty 1

## 2022-08-04 MED ORDER — SODIUM CHLORIDE 0.9 % IV SOLN: Freq: Once | INTRAVENOUS | Status: AC

## 2022-08-04 MED ORDER — SODIUM CHLORIDE 0.9 % IV SOLN: 8.0000 mg | Freq: Once | INTRAVENOUS | Status: DC

## 2022-08-04 MED ORDER — FAMOTIDINE IN NACL 20-0.9 MG/50ML-% IV SOLN
20.0000 mg | Freq: Once | INTRAVENOUS | Status: AC
  Administered 2022-08-04: 20 mg via INTRAVENOUS
  Filled 2022-08-04: qty 50

## 2022-08-04 MED ORDER — ONDANSETRON HCL 4 MG/2ML IJ SOLN
8.0000 mg | Freq: Once | INTRAMUSCULAR | Status: AC
  Administered 2022-08-04: 8 mg via INTRAVENOUS
  Filled 2022-08-04: qty 4

## 2022-08-04 NOTE — Progress Notes (Signed)
Pt presents today for Feraheme IV iron per provider's order. Vital signs stable and pt voiced no new complaints at this time.  Peripheral IV started with good blood return pre and post infusion.  Feraheme given today per MD orders. Tolerated infusion without adverse affects. Vital signs stable. No complaints at this time. Discharged from clinic ambulatory with walker in stable condition. Alert and oriented x 3. F/U with [facility] as scheduled. Pt was refused to wait the 30 minute time per policy.

## 2022-08-04 NOTE — Progress Notes (Signed)
Patient tolerated 90 mg Feraheme previously, Dr Delton Coombes is ok with giving full dose 510 mg today.  T.O. Dr Rhys Martini, PharmD

## 2022-08-04 NOTE — Patient Instructions (Signed)
[facility]  Discharge Instructions: Thank you for choosing [facility] to provide your oncology and hematology care.  If you have a lab appointment with the [facility], please come in thru the Main Entrance and check in at the main information desk.  Wear comfortable clothing and clothing appropriate for easy access to any Portacath or PICC line.   We strive to give you quality time with your provider. You may need to reschedule your appointment if you arrive late (15 or more minutes).  Arriving late affects you and other patients whose appointments are after yours.  Also, if you miss three or more appointments without notifying the office, you may be dismissed from the clinic at the provider's discretion.      For prescription refill requests, have your pharmacy contact our office and allow 72 hours for refills to be completed.    Today you received the following chemotherapy and/or immunotherapy agents Feraheme IV iron.    To help prevent nausea and vomiting after your treatment, we encourage you to take your nausea medication as directed.  BELOW ARE SYMPTOMS THAT SHOULD BE REPORTED IMMEDIATELY: *FEVER GREATER THAN 100.4 F (38 C) OR HIGHER *CHILLS OR SWEATING *NAUSEA AND VOMITING THAT IS NOT CONTROLLED WITH YOUR NAUSEA MEDICATION *UNUSUAL SHORTNESS OF BREATH *UNUSUAL BRUISING OR BLEEDING *URINARY PROBLEMS (pain or burning when urinating, or frequent urination) *BOWEL PROBLEMS (unusual diarrhea, constipation, pain near the anus) TENDERNESS IN MOUTH AND THROAT WITH OR WITHOUT PRESENCE OF ULCERS (sore throat, sores in mouth, or a toothache) UNUSUAL RASH, SWELLING OR PAIN  UNUSUAL VAGINAL DISCHARGE OR ITCHING   Items with * indicate a potential emergency and should be followed up as soon as possible or go to the Emergency Department if any problems should occur.  Please show the CHEMOTHERAPY ALERT CARD or IMMUNOTHERAPY ALERT CARD at check-in to the  Emergency Department and triage nurse.  Should you have questions after your visit or need to cancel or reschedule your appointment, please contact [facility] [phone]  and follow the prompts.  Office hours are 8:00 a.m. to 4:30 p.m. Monday - Friday. Please note that voicemails left after 4:00 p.m. may not be returned until the following business day.  We are closed weekends and major holidays. You have access to a nurse at all times for urgent questions. Please call the main number to the clinic [phone] and follow the prompts.  For any non-urgent questions, you may also contact your provider using MyChart. We now offer e-Visits for anyone 84 and older to request care online for non-urgent symptoms. For details visit [email or web address].   Also download the MyChart app! Go to the app store, search "MyChart", open the app, select [facility], and log in with your MyChart username and password.

## 2022-08-05 ENCOUNTER — Other Ambulatory Visit: Payer: Self-pay

## 2022-08-05 ENCOUNTER — Telehealth: Payer: Self-pay | Admitting: Internal Medicine

## 2022-08-05 MED ORDER — DOXEPIN HCL 10 MG PO CAPS: 10.0000 mg | ORAL_CAPSULE | Freq: Every day | ORAL | 3 refills | Status: DC

## 2022-08-05 NOTE — Telephone Encounter (Signed)
Patient needs refill on   doxepin (SINEQUAN) 10 MG capsule   [facility] on [address]

## 2022-08-05 NOTE — Progress Notes (Signed)
Patient called stating that she received Iron yesterday and that she has been having a bad reaction since 2:30 am this morning. She is having severe diarrhea, rash and is worried that she is dehydrated. She denies shortness of breath or any respiratory issues. Asked the patient if she has taken any imodium to see if that would help the diarrhea or any benadryl to see if that would help the rash. She has not but will try that. Informed that if that does not help with diarrhea and does not help the rash that she needs to go to the emergency department. Patient is agreeable with plan and states understanding of low threshold for ED if any shortness of breath or respiratory issues.

## 2022-08-05 NOTE — Telephone Encounter (Signed)
Refills sent

## 2022-08-09 ENCOUNTER — Ambulatory Visit: Payer: Medicare Other | Admitting: Physician Assistant

## 2022-08-11 ENCOUNTER — Ambulatory Visit: Payer: Medicare Other

## 2022-08-14 ENCOUNTER — Encounter: Payer: Self-pay | Admitting: Hematology

## 2022-08-15 ENCOUNTER — Other Ambulatory Visit: Payer: Medicare Other

## 2022-08-17 ENCOUNTER — Inpatient Hospital Stay: Payer: 59

## 2022-08-22 ENCOUNTER — Ambulatory Visit: Payer: Medicare Other | Admitting: Physician Assistant

## 2022-08-22 DIAGNOSIS — J9611 Chronic respiratory failure with hypoxia: Secondary | ICD-10-CM | POA: Diagnosis not present

## 2022-08-22 DIAGNOSIS — J449 Chronic obstructive pulmonary disease, unspecified: Secondary | ICD-10-CM | POA: Diagnosis not present

## 2022-08-24 ENCOUNTER — Inpatient Hospital Stay: Payer: 59

## 2022-08-31 ENCOUNTER — Other Ambulatory Visit: Payer: Self-pay | Admitting: Internal Medicine

## 2022-08-31 DIAGNOSIS — F411 Generalized anxiety disorder: Secondary | ICD-10-CM

## 2022-09-01 ENCOUNTER — Ambulatory Visit (INDEPENDENT_AMBULATORY_CARE_PROVIDER_SITE_OTHER): Payer: 59 | Admitting: Internal Medicine

## 2022-09-01 ENCOUNTER — Encounter: Payer: Self-pay | Admitting: Internal Medicine

## 2022-09-01 VITALS — BP 127/60 | HR 85 | Temp 98.7°F | Ht 68.0 in | Wt 143.4 lb

## 2022-09-01 DIAGNOSIS — I5032 Chronic diastolic (congestive) heart failure: Secondary | ICD-10-CM | POA: Diagnosis not present

## 2022-09-01 DIAGNOSIS — J439 Emphysema, unspecified: Secondary | ICD-10-CM

## 2022-09-01 DIAGNOSIS — Z780 Asymptomatic menopausal state: Secondary | ICD-10-CM | POA: Diagnosis not present

## 2022-09-01 DIAGNOSIS — I10 Essential (primary) hypertension: Secondary | ICD-10-CM

## 2022-09-01 DIAGNOSIS — F411 Generalized anxiety disorder: Secondary | ICD-10-CM

## 2022-09-01 DIAGNOSIS — E041 Nontoxic single thyroid nodule: Secondary | ICD-10-CM | POA: Diagnosis not present

## 2022-09-01 DIAGNOSIS — J9611 Chronic respiratory failure with hypoxia: Secondary | ICD-10-CM

## 2022-09-01 DIAGNOSIS — E1149 Type 2 diabetes mellitus with other diabetic neurological complication: Secondary | ICD-10-CM | POA: Diagnosis not present

## 2022-09-01 MED ORDER — EMPAGLIFLOZIN 10 MG PO TABS: 10.0000 mg | ORAL_TABLET | Freq: Every day | ORAL | 5 refills | Status: DC

## 2022-09-01 MED ORDER — METFORMIN HCL 500 MG PO TABS: 500.0000 mg | ORAL_TABLET | Freq: Every day | ORAL | 1 refills | Status: DC

## 2022-09-01 NOTE — Progress Notes (Signed)
Established Patient Office Visit  Subjective:  Patient ID: Kayla Ryan, female    DOB: 1956/03/25  Age: 67 y.o. MRN: [id number]  CC:  Chief Complaint  Patient presents with   Diabetes    Three month follow up on her diabetes . She is having pain and numbness in feet and ankle. Her hair is falling out by the handfuls    HPI Kayla Ryan is a 67 y.o. female with past medical history of HTN, HFpEF, COPD, GERD, type II DM with neuropathy, CKD, GAD and chronic pain syndrome who presents for f/u of her chronic medical conditions.  Type II DM: She has been taking Mounjaro, and has been tolerating it well.  Her blood glucose has improved now, but had  episodes of hypoglycemia. She has a CGM. She has stopped taking [place] as she had urinary discomfort with it.  She has chronic numbness of bilateral foot.  She takes gabapentin 800 mg daily currently.  Denies any polyuria or polyphagia.  She has chronic fatigue.  GAD: She takes Ativan 0.5 mg 3 times daily as needed. She is also on Cymbalta.   She has history of COPD and chronic hypoxic respiratory failure.  She has acute Stiolto and as needed albuterol.  She uses 3 LPM O2 at home.  She still complains of hair loss.  Denies any recent skin changes.  She is taking hair, nail and skin multi vitamin currently.  Denies any recent change in her shampoo or soap.  She has history of right thyroid nodule, which was stable in size on recent US of thyroid.  Past Medical History:  Diagnosis Date   Allergy Unknown   Anemia    Anxiety 1883   Arthritis    Asthma    Blood transfusion without reported diagnosis 2012   Chest pain    Chronic bronchitis    Congestive heart failure (CHF) (HCC)    COPD (chronic obstructive pulmonary disease) (HCC)    Crohn disease ([place])    Diabetes mellitus without complication ([place]) 3/87/5643   Type II   Emphysema    Emphysema of lung (HCC)    Fibromyalgia    GERD (gastroesophageal reflux disease) 2980   Herpes     Hyperlipidemia    Hypertension 2015   IBS (irritable bowel syndrome)    Kidney failure    Migraines    Muscular dystrophy ([place])    Neck pain    Oxygen deficiency 2022   PAF (paroxysmal atrial fibrillation) ([place]) 05/01/2021   PAF with RVR in setting of severe anemia   Plantar fasciitis    Polymyositis (HCC)    PONV (postoperative nausea and vomiting)    Right thyroid nodule 04/07/2022   Scoliosis     Past Surgical History:  Procedure Laterality Date   ABDOMINAL HYSTERECTOMY     AGILE CAPSULE N/A 06/24/2021   Procedure: AGILE CAPSULE;  Surgeon: Eloise Harman, DO;  [facility];  Service: Endoscopy;  Laterality: N/A;   AGILE CAPSULE N/A 07/22/2021   Procedure: AGILE CAPSULE;  Surgeon: Daneil Dolin, MD;  [facility];  Service: Endoscopy;  Laterality: N/A;  7:30am   bone spur     BREAST SURGERY  1995   CHOLECYSTECTOMY     COLONOSCOPY WITH PROPOFOL N/A 04/30/2021   Procedure: COLONOSCOPY WITH PROPOFOL;  Surgeon: Daneil Dolin, MD;  [facility];  Service: Endoscopy;  Laterality: N/A;   ESOPHAGEAL DILATION N/A 04/30/2021   Procedure: ESOPHAGEAL  DILATION;  Surgeon: Daneil Dolin, MD;  [facility];  Service: Endoscopy;  Laterality: N/A;   ESOPHAGOGASTRODUODENOSCOPY (EGD) WITH PROPOFOL N/A 04/30/2021   Procedure: ESOPHAGOGASTRODUODENOSCOPY (EGD) WITH PROPOFOL;  Surgeon: Daneil Dolin, MD;  [facility];  Service: Endoscopy;  Laterality: N/A;   GIVENS CAPSULE STUDY N/A 09/22/2021   Procedure: GIVENS CAPSULE STUDY;  Surgeon: Daneil Dolin, MD;  [facility];  Service: Endoscopy;  Laterality: N/A;  7:30am   HERNIA REPAIR     LEFT HEART CATHETERIZATION WITH CORONARY ANGIOGRAM N/A 11/07/2013   Procedure: LEFT HEART CATHETERIZATION WITH CORONARY ANGIOGRAM;  Surgeon: Lorretta Harp, MD;  Location: [facility] C[facility];  Service: Cardiovascular;  Laterality: N/A;   MASTECTOMY PARTIAL / LUMPECTOMY     OOPHORECTOMY      ROTATOR CUFF REPAIR     TOOTH EXTRACTION N/A 04/15/2022   Procedure: DENTAL RESTORATION/EXTRACTIONS;  Surgeon: Diona Browner, DMD;  Location: [place] [place];  Service: Oral Surgery;  Laterality: N/A;    Family History  Problem Relation Age of Onset   Lung cancer Mother    Cancer Mother    Miscarriages / [place] Mother    Varicose Veins Mother    COPD Father    Lung cancer Father    Lymphoma Father    Alcohol abuse Father    Arthritis Father    Anxiety disorder Sister    Depression Brother    Anxiety disorder Sister     Social History   Socioeconomic History   Marital status: Widowed    Spouse name: Not on file   Number of children: Not on file   Years of education: Not on file   Highest education level: Not on file  Occupational History   Not on file  Tobacco Use   Smoking status: Former    Packs/day: 1.00    Years: 49.00    Total pack years: 49.00    Types: Cigarettes    Start date: 08/08/1966    Quit date: 08/31/2005    Years since quitting: 17.0   Smokeless tobacco: Never  Vaping Use   Vaping Use: Never used  Substance and Sexual Activity   Alcohol use: No   Drug use: No    Comment: used marijuana in teens   Sexual activity: Not Currently    Birth control/protection: Surgical  Other Topics Concern   Not on file  Social History Narrative   Not on file   Social Determinants of Health   Financial Resource Strain: Low Risk  (03/14/2022)   Overall Financial Resource Strain (CARDIA)    Difficulty of Paying Living Expenses: Not hard at all  Food Insecurity: No Food Insecurity (03/14/2022)   Hunger Vital Sign    Worried About Running Out of Food in the Last Year: Never true    [place] in the Last Year: Never true  Transportation Needs: No Transportation Needs (03/14/2022)   PRAPARE - Hydrologist (Medical): No    Lack of Transportation (Non-Medical): No  Physical Activity: Inactive (03/14/2022)   Exercise Vital Sign    Days of Exercise  per Week: 0 days    Minutes of Exercise per Session: 0 min  Stress: No Stress Concern Present (03/14/2022)   [place]    Feeling of Stress : Not at all  Social Connections: Socially Isolated (03/14/2022)   Social Connection and Isolation Panel [NHANES]    Frequency  of Communication with Friends and Family: More than three times a week    Frequency of Social Gatherings with Friends and Family: Twice a week    Attends Religious Services: Never    Marine scientist or Organizations: No    Attends Archivist Meetings: Never    Marital Status: Widowed  Intimate Partner Violence: Not At Risk (03/14/2022)   Humiliation, Afraid, Rape, and Kick questionnaire    Fear of Current or Ex-Partner: No    Emotionally Abused: No    Physically Abused: No    Sexually Abused: No    Outpatient Medications Prior to Visit  Medication Sig Dispense Refill   AREXVY 120 MCG/0.5ML injection      acyclovir (ZOVIRAX) 400 MG tablet Take 400 mg by mouth every evening.     blood glucose meter kit and supplies Dispense based on patient and insurance preference. Once daily testing DX E11.9 1 each 0   cetirizine (ZYRTEC) 10 MG tablet Take 10 mg by mouth 2 (two) times daily.     Cholecalciferol (VITAMIN D3) 50 MCG (2000 UT) capsule Take 2,000 Units by mouth daily.     Continuous Blood Gluc Receiver (DEXCOM G7 RECEIVER) DEVI To check blood glucose PRN 1 each 0   Continuous Blood Gluc Sensor (DEXCOM G7 SENSOR) MISC Check blood glucose at least 3 times before meals and at bedtime, and as needed. 3 each 5   dicyclomine (BENTYL) 10 MG capsule Take 1 capsule (10 mg total) by mouth 4 (four) times daily -  before meals and at bedtime. Monitor for constipation, dry mouth, dizziness (Patient taking differently: Take 10 mg by mouth 4 (four) times daily as needed (Bowel Spasm/Diarrhea). Monitor for constipation, dry mouth, dizziness) 120 capsule 3    diphenhydrAMINE (BENADRYL) 25 MG tablet Take 25 mg by mouth every 4 (four) hours as needed for allergies or itching.     doxepin (SINEQUAN) 10 MG capsule Take 1 capsule (10 mg total) by mouth at bedtime. 30 capsule 3   DULoxetine (CYMBALTA) 60 MG capsule Take 1 capsule (60 mg total) by mouth 2 (two) times daily. (Patient taking differently: Take 60 mg by mouth every evening.) 60 capsule 3   gabapentin (NEURONTIN) 800 MG tablet Take 800 mg by mouth 3 (three) times daily.     gemfibrozil (LOPID) 600 MG tablet Take 1 tablet (600 mg total) by mouth 2 (two) times daily before a meal. 180 tablet 1   Glucagon (GVOKE HYPOPEN 2-PACK) 1 MG/0.2ML SOAJ Inject 0.2 mLs into the skin as needed (Blood glucose < 53). 0.4 mL 5   Glycerin-Hypromellose-PEG 400 (DRY EYE RELIEF DROPS OP) Place 1 drop into both eyes 3 (three) times daily as needed (Dry eye).     HYDROcodone-acetaminophen (NORCO) 10-325 MG tablet Take 1 tablet by mouth every 4 (four) hours as needed. Stop Hydrocodone 7.5/'325mg'$  tablet. 180 tablet 0   hydrocortisone (ANUSOL-HC) 2.5 % rectal cream Place 1 Application rectally 2 (two) times daily. (Patient taking differently: Place 1 Application rectally 2 (two) times daily as needed for hemorrhoids or anal itching.) 30 g 1   ipratropium (ATROVENT HFA) 17 MCG/ACT inhaler Inhale 2 puffs into the lungs every 4 (four) hours as needed (COPD).     isosorbide dinitrate (ISORDIL) 30 MG tablet TAKE 1 TABLET(30 MG) BY MOUTH DAILY 90 tablet 1   ketoconazole (NIZORAL) 2 % cream Apply 1 Application topically daily. 30 g 0   LORazepam (ATIVAN) 0.5 MG tablet TAKE 1 TABLET(0.5 MG) BY  MOUTH THREE TIMES DAILY AS NEEDED FOR ANXIETY 90 tablet 2   meclizine (ANTIVERT) 25 MG tablet TAKE 1 TABLET(25 MG) BY MOUTH THREE TIMES DAILY AS NEEDED FOR DIZZINESS 30 tablet 2   methocarbamol (ROBAXIN) 500 MG tablet TAKE 1 TABLET(500 MG) BY MOUTH EVERY 8 HOURS AS NEEDED FOR MUSCLE SPASMS 30 tablet 1   montelukast (SINGULAIR) 10 MG tablet Take 10  mg by mouth at bedtime.     NITROSTAT 0.4 MG SL tablet Place 0.4 mg under the tongue every 15 (fifteen) minutes as needed for chest pain.      omeprazole (PRILOSEC) 40 MG capsule Take 40 mg by mouth 2 (two) times daily.     ondansetron (ZOFRAN) 4 MG tablet TAKE 1 TABLET(4 MG) BY MOUTH EVERY 8 HOURS AS NEEDED FOR NAUSEA OR VOMITING 20 tablet 0   sennosides-docusate sodium (SENOKOT-S) 8.6-50 MG tablet Take 1 tablet by mouth daily. 30 tablet 5   simethicone (GAS-X) 80 MG chewable tablet Chew 1 tablet (80 mg total) by mouth every 6 (six) hours as needed for flatulence. 30 tablet 0   Tiotropium Bromide-Olodaterol (STIOLTO RESPIMAT) 2.5-2.5 MCG/ACT AERS Inhale 2 each into the lungs daily. 4 g 11   tirzepatide (MOUNJARO) 5 MG/0.5ML Pen Inject 5 mg into the skin once a week. 6 mL 1   vitamin B-12 (CYANOCOBALAMIN) 100 MCG tablet Take 100 mcg by mouth daily.     FARXIGA 10 MG TABS tablet Take 1 tablet (10 mg total) by mouth daily. 30 tablet 5   metFORMIN (GLUCOPHAGE) 500 MG tablet Take 1 tablet (500 mg total) by mouth 2 (two) times daily with a meal. 60 tablet 5   ondansetron (ZOFRAN-ODT) 4 MG disintegrating tablet Take 1 tablet (4 mg total) by mouth every 6 (six) hours as needed for nausea. 20 tablet 0   No facility-administered medications prior to visit.    Allergies  Allergen Reactions   Sulfa Antibiotics Shortness Of Breath, Swelling and Rash   Clindamycin/Lincomycin    Dilaudid [Hydromorphone Hcl]     Makes me crazy   Iron     Throat starts closing up, tongue swells, severe headaches and backpain   Metronidazole Diarrhea and Nausea And Vomiting   Prednisone Other (See Comments)    Angry     Statins Other (See Comments)    Unknown    Cephalexin Diarrhea and Nausea And Vomiting   Methotrexate Derivatives Swelling and Rash   Morphine And Related Anxiety    "exreme mood swings"    ROS Review of Systems  Constitutional:  Positive for fatigue. Negative for chills and fever.  HENT:   Negative for congestion, sinus pressure, sinus pain and sore throat.   Eyes:  Negative for pain and discharge.  Respiratory:  Positive for shortness of breath. Negative for cough.   Cardiovascular:  Negative for chest pain and palpitations.  Gastrointestinal:  Positive for constipation. Negative for abdominal pain, nausea and vomiting.  Endocrine: Negative for polydipsia and polyuria.  Genitourinary:  Negative for dysuria and hematuria.  Musculoskeletal:  Positive for arthralgias, back pain, gait problem and neck pain. Negative for neck stiffness.  Skin:  Negative for rash.  Neurological:  Positive for dizziness. Negative for weakness.  Psychiatric/Behavioral:  Positive for sleep disturbance. Negative for agitation and behavioral problems. The patient is nervous/anxious.       Objective:    Physical Exam Vitals reviewed.  Constitutional:      General: She is not in acute distress.    Appearance: She  is not diaphoretic.  HENT:     Head: Normocephalic and atraumatic.     Nose: Nose normal.     Mouth/Throat:     Mouth: Mucous membranes are moist.  Eyes:     General: No scleral icterus.    Extraocular Movements: Extraocular movements intact.  Cardiovascular:     Rate and Rhythm: Normal rate and regular rhythm.     Pulses: Normal pulses.     Heart sounds: Normal heart sounds. No murmur heard. Pulmonary:     Breath sounds: Normal breath sounds. No wheezing or rales.     Comments: On 3 LPM O2 Musculoskeletal:     Cervical back: Neck supple. No tenderness.     Right lower leg: No edema.     Left lower leg: No edema.  Skin:    General: Skin is warm.     Findings: No rash.  Neurological:     General: No focal deficit present.     Mental Status: She is alert and oriented to person, place, and time.     Sensory: Sensory deficit present.     Motor: Weakness (4/5 in b/l UE and LE) present.  Psychiatric:        Mood and Affect: Mood normal.        Behavior: Behavior normal.      BP 127/60 (BP Location: Right Arm, Patient Position: Sitting, Cuff Size: Normal)   Pulse 85   Temp 98.7 F (37.1 C) (Oral)   Ht '5\' 8"'$  (1.727 m)   Wt 143 lb 6.4 oz (65 kg)   SpO2 96%   BMI 21.80 kg/m  Wt Readings from Last 3 Encounters:  09/01/22 143 lb 6.4 oz (65 kg)  06/03/22 156 lb 12.8 oz (71.1 kg)  04/15/22 170 lb (77.1 kg)    Lab Results  Component Value Date   TSH 3.918 05/01/2021   Lab Results  Component Value Date   WBC 11.4 (H) 05/18/2022   HGB 9.5 (L) 05/18/2022   HCT 32.0 (L) 05/18/2022   MCV 82.3 05/18/2022   PLT 369 05/18/2022   Lab Results  Component Value Date   NA 137 06/03/2022   K 5.3 (H) 06/03/2022   CO2 24 06/03/2022   GLUCOSE 83 06/03/2022   BUN 18 06/03/2022   CREATININE 1.11 (H) 06/03/2022   BILITOT 0.2 06/03/2022   ALKPHOS 111 06/03/2022   AST 35 06/03/2022   ALT 49 (H) 06/03/2022   PROT 7.5 06/03/2022   ALBUMIN 4.9 06/03/2022   CALCIUM 10.7 (H) 06/03/2022   ANIONGAP 13 04/15/2022   EGFR 54 (L) 06/03/2022   GFR 58.40 (L) 01/28/2021   Lab Results  Component Value Date   CHOL 189 06/03/2022   Lab Results  Component Value Date   HDL 57 06/03/2022   Lab Results  Component Value Date   LDLCALC 107 (H) 06/03/2022   Lab Results  Component Value Date   TRIG 141 06/03/2022   Lab Results  Component Value Date   CHOLHDL 3.3 06/03/2022   Lab Results  Component Value Date   HGBA1C 5.6 06/03/2022      Assessment & Plan:   Problem List Items Addressed This Visit       Cardiovascular and Mediastinum   Essential hypertension    BP Readings from Last 1 Encounters:  09/01/22 127/60  Well-controlled with Imdur (for CAD/HFpEF) Counseled for compliance with the medications Advised DASH diet and moderate exercise/walking as tolerated      (HFpEF) heart failure  with preserved ejection fraction ([place])    Had acute CHF exacerbation in the past Currently euvolemic Was on Farxiga, but could not tolerate it - switched to  Jardiance Referred to Cardiology      Relevant Medications   empagliflozin (JARDIANCE) 10 MG TABS tablet     Respiratory   Chronic obstructive pulmonary disease/Emphysema    Well controlled with Stiolto and as needed albuterol Followed by Pulmonology Has 3 lpm home O2 for chronic hypoxia      Chronic respiratory failure with hypoxia ([place])    Due to COPD Followed by Pulmonology Has 3 lpm home O2 for chronic hypoxia        Endocrine   Type 2 diabetes mellitus with neurological complications ([place]) - Primary    Lab Results  Component Value Date   HGBA1C 5.6 06/03/2022  Well controlled now On Metformin and Mounjaro 5 mg qw Has had episodes of hypoglycemia in the past, has CGM now, 2 recent episode of hypoglycemia -decreased metformin from 500 mg twice daily to once daily as her hypoglycemia episodes are mostly in the early morning  Switched from [place] to [place] for HFpEF (did not tolerate Farxiga 10 mg) Advised to follow diabetic diet Has statin intolerance F/u CMP and lipid panel Diabetic eye exam: Advised to follow up with Ophthalmology for diabetic eye exam  Takes gabapentin 800 mg QD, advised to take it twice daily as she has persistent numbness and tingling of bilateral LE      Relevant Medications   empagliflozin (JARDIANCE) 10 MG TABS tablet   metFORMIN (GLUCOPHAGE) 500 MG tablet   Other Relevant Orders   CMP14+EGFR   Hemoglobin A1c   Right thyroid nodule    Recent US of thyroid showed 3.2 cm thyroid nodule Check TSH, she has chronic hair loss (?) Order US thyroid in 04/24 visit      Relevant Orders   TSH + free T4     Other   GAD (generalized anxiety disorder)    Takes Ativan 0.5 mg TID PRN On Cymbalta Refilled, PDMP reviewed      Other Visit Diagnoses     Postmenopausal       Relevant Orders   DG Bone Density       Meds ordered this encounter  Medications   empagliflozin (JARDIANCE) 10 MG TABS tablet    Sig: Take 1 tablet (10 mg  total) by mouth daily before breakfast.    Dispense:  30 tablet    Refill:  5   metFORMIN (GLUCOPHAGE) 500 MG tablet    Sig: Take 1 tablet (500 mg total) by mouth daily with breakfast.    Dispense:  90 tablet    Refill:  1    Follow-up: Return in about 3 months (around 12/01/2022) for Annual physical.    Lindell Spar, MD

## 2022-09-01 NOTE — Assessment & Plan Note (Addendum)
Had acute CHF exacerbation in the past Currently euvolemic Was on Farxiga, but could not tolerate it - switched to Jardiance Referred to Cardiology

## 2022-09-01 NOTE — Assessment & Plan Note (Signed)
Due to COPD Followed by Pulmonology Has 3 lpm home O2 for chronic hypoxia 

## 2022-09-01 NOTE — Assessment & Plan Note (Signed)
Takes Ativan 0.5 mg TID PRN On Cymbalta Refilled, PDMP reviewed 

## 2022-09-01 NOTE — Patient Instructions (Signed)
Please start taking Jardiance instead of Farxiga.  Please start taking Metformin once daily instead of twice daily.  Please continue taking other medications as prescribed.

## 2022-09-01 NOTE — Assessment & Plan Note (Addendum)
Recent US of thyroid showed 3.2 cm thyroid nodule Check TSH, she has chronic hair loss (?) Order US thyroid in 04/24 visit

## 2022-09-01 NOTE — Assessment & Plan Note (Signed)
Well controlled with Stiolto and as needed albuterol Followed by Pulmonology Has 3 lpm home O2 for chronic hypoxia 

## 2022-09-01 NOTE — Assessment & Plan Note (Addendum)
Lab Results  Component Value Date   HGBA1C 5.6 06/03/2022   Well controlled now On Metformin and Mounjaro 5 mg qw Has had episodes of hypoglycemia in the past, has CGM now, 2 recent episode of hypoglycemia -decreased metformin from 500 mg twice daily to once daily as her hypoglycemia episodes are mostly in the early morning  Switched from [place] to [place] for HFpEF (did not tolerate Farxiga 10 mg) Advised to follow diabetic diet Has statin intolerance F/u CMP and lipid panel Diabetic eye exam: Advised to follow up with Ophthalmology for diabetic eye exam  Takes gabapentin 800 mg QD, advised to take it twice daily as she has persistent numbness and tingling of bilateral LE

## 2022-09-01 NOTE — Assessment & Plan Note (Signed)
BP Readings from Last 1 Encounters:  09/01/22 127/60   Well-controlled with Imdur (for CAD/HFpEF) Counseled for compliance with the medications Advised DASH diet and moderate exercise/walking as tolerated

## 2022-09-02 LAB — CMP14+EGFR
ALT: 35 IU/L — ABNORMAL HIGH (ref 0–32)
AST: 33 IU/L (ref 0–40)
Albumin/Globulin Ratio: 1.8 (ref 1.2–2.2)
Albumin: 5.1 g/dL — ABNORMAL HIGH (ref 3.9–4.9)
Alkaline Phosphatase: 59 IU/L (ref 44–121)
BUN/Creatinine Ratio: 19 (ref 12–28)
BUN: 22 mg/dL (ref 8–27)
Bilirubin Total: 0.2 mg/dL (ref 0.0–1.2)
CO2: 25 mmol/L (ref 20–29)
Calcium: 10 mg/dL (ref 8.7–10.3)
Chloride: 99 mmol/L (ref 96–106)
Creatinine, Ser: 1.13 mg/dL — ABNORMAL HIGH (ref 0.57–1.00)
Globulin, Total: 2.8 g/dL (ref 1.5–4.5)
Glucose: 82 mg/dL (ref 70–99)
Potassium: 4.6 mmol/L (ref 3.5–5.2)
Sodium: 139 mmol/L (ref 134–144)
Total Protein: 7.9 g/dL (ref 6.0–8.5)
eGFR: 53 mL/min/{1.73_m2} — ABNORMAL LOW (ref >59)

## 2022-09-02 LAB — HEMOGLOBIN A1C
Est. average glucose Bld gHb Est-mCnc: 103 mg/dL
Hgb A1c MFr Bld: 5.2 % (ref 4.8–5.6)

## 2022-09-02 LAB — TSH+FREE T4
Free T4: 1.16 ng/dL (ref 0.82–1.77)
TSH: 3.8 u[IU]/mL (ref 0.450–4.500)

## 2022-09-05 ENCOUNTER — Inpatient Hospital Stay: Payer: 59

## 2022-09-07 ENCOUNTER — Ambulatory Visit: Payer: 59 | Admitting: Cardiology

## 2022-09-07 ENCOUNTER — Encounter: Payer: Self-pay | Admitting: Cardiology

## 2022-09-07 NOTE — Progress Notes (Deleted)
Cardiology Office Note  Date: 09/07/2022   ID: Kayla Ryan, DOB June 09, 1956, MRN [id number]  PCP:  Kayla Spar, MD  Cardiologist:  None Electrophysiologist:  None   No chief complaint on file.   History of Present Illness: Kayla Ryan is a 67 y.o. female referred for cardiology consultation by Dr. Posey Ryan to establish follow-up of reported HFpEF.  I reviewed extensive records.  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[facility])    PONV (postoperative nausea and vomiting)    Right thyroid nodule 04/07/2022   Scoliosis    Type 2 diabetes mellitus ([place])     Past Surgical History:  Procedure Laterality Date   ABDOMINAL HYSTERECTOMY     AGILE CAPSULE N/A 06/24/2021   Procedure: AGILE CAPSULE;  Surgeon: Eloise Harman, DO;  [facility];  Service: Endoscopy;  Laterality: N/A;   AGILE CAPSULE N/A 07/22/2021   Procedure: AGILE CAPSULE;  Surgeon: Daneil Dolin, MD;  [facility];  Service: Endoscopy;  Laterality: N/A;  7:30am   bone spur     BREAST SURGERY  1995   CHOLECYSTECTOMY     COLONOSCOPY WITH PROPOFOL N/A 04/30/2021   Procedure: COLONOSCOPY WITH PROPOFOL;  Surgeon: Daneil Dolin, MD;  [facility];  Service: Endoscopy;  Laterality: N/A;   ESOPHAGEAL DILATION N/A 04/30/2021   Procedure: ESOPHAGEAL DILATION;  Surgeon: Daneil Dolin, MD;  [facility];  Service: Endoscopy;  Laterality: N/A;    ESOPHAGOGASTRODUODENOSCOPY (EGD) WITH PROPOFOL N/A 04/30/2021   Procedure: ESOPHAGOGASTRODUODENOSCOPY (EGD) WITH PROPOFOL;  Surgeon: Daneil Dolin, MD;  [facility];  Service: Endoscopy;  Laterality: N/A;   GIVENS CAPSULE STUDY N/A 09/22/2021   Procedure: GIVENS CAPSULE STUDY;  Surgeon: Daneil Dolin, MD;  [facility];  Service: Endoscopy;  Laterality: N/A;  7:30am   HERNIA REPAIR     LEFT HEART CATHETERIZATION WITH CORONARY ANGIOGRAM N/A 11/07/2013   Procedure: LEFT HEART CATHETERIZATION WITH CORONARY ANGIOGRAM;  Surgeon: Lorretta Harp, MD;  Location: [facility] CATH LAB;  Service: Cardiovascular;  Laterality: N/A;   MASTECTOMY PARTIAL / LUMPECTOMY     OOPHORECTOMY     ROTATOR CUFF REPAIR     TOOTH EXTRACTION N/A 04/15/2022   Procedure: DENTAL RESTORATION/EXTRACTIONS;  Surgeon: Diona Browner, DMD;  Location: [facility];  Service: Oral Surgery;  Laterality: N/A;    Current Outpatient Medications  Medication Sig Dispense Refill   acyclovir (ZOVIRAX) 400 MG tablet Take 400 mg by mouth every evening.     blood glucose meter kit and supplies Dispense based on patient and insurance preference. Once daily testing DX E11.9 1 each 0   cetirizine (ZYRTEC) 10 MG tablet Take 10 mg by mouth 2 (two) times daily.  Cholecalciferol (VITAMIN D3) 50 MCG (2000 UT) capsule Take 2,000 Units by mouth daily.     Continuous Blood Gluc Receiver (DEXCOM G7 RECEIVER) DEVI To check blood glucose PRN 1 each 0   Continuous Blood Gluc Sensor (DEXCOM G7 SENSOR) MISC Check blood glucose at least 3 times before meals and at bedtime, and as needed. 3 each 5   dicyclomine (BENTYL) 10 MG capsule Take 1 capsule (10 mg total) by mouth 4 (four) times daily -  before meals and at bedtime. Monitor for constipation, dry mouth, dizziness (Patient taking differently: Take 10 mg by mouth 4 (four) times daily as needed (Bowel Spasm/Diarrhea). Monitor for constipation, dry mouth, dizziness) 120 capsule 3   diphenhydrAMINE  (BENADRYL) 25 MG tablet Take 25 mg by mouth every 4 (four) hours as needed for allergies or itching.     doxepin (SINEQUAN) 10 MG capsule Take 1 capsule (10 mg total) by mouth at bedtime. 30 capsule 3   DULoxetine (CYMBALTA) 60 MG capsule Take 1 capsule (60 mg total) by mouth 2 (two) times daily. (Patient taking differently: Take 60 mg by mouth every evening.) 60 capsule 3   empagliflozin (JARDIANCE) 10 MG TABS tablet Take 1 tablet (10 mg total) by mouth daily before breakfast. 30 tablet 5   gabapentin (NEURONTIN) 800 MG tablet Take 800 mg by mouth 3 (three) times daily.     gemfibrozil (LOPID) 600 MG tablet Take 1 tablet (600 mg total) by mouth 2 (two) times daily before a meal. 180 tablet 1   Glucagon (GVOKE HYPOPEN 2-PACK) 1 MG/0.2ML SOAJ Inject 0.2 mLs into the skin as needed (Blood glucose < 53). 0.4 mL 5   Glycerin-Hypromellose-PEG 400 (DRY EYE RELIEF DROPS OP) Place 1 drop into both eyes 3 (three) times daily as needed (Dry eye).     HYDROcodone-acetaminophen (NORCO) 10-325 MG tablet Take 1 tablet by mouth every 4 (four) hours as needed. Stop Hydrocodone 7.5/'325mg'$  tablet. 180 tablet 0   hydrocortisone (ANUSOL-HC) 2.5 % rectal cream Place 1 Application rectally 2 (two) times daily. (Patient taking differently: Place 1 Application rectally 2 (two) times daily as needed for hemorrhoids or anal itching.) 30 g 1   ipratropium (ATROVENT HFA) 17 MCG/ACT inhaler Inhale 2 puffs into the lungs every 4 (four) hours as needed (COPD).     isosorbide dinitrate (ISORDIL) 30 MG tablet TAKE 1 TABLET(30 MG) BY MOUTH DAILY 90 tablet 1   ketoconazole (NIZORAL) 2 % cream Apply 1 Application topically daily. 30 g 0   LORazepam (ATIVAN) 0.5 MG tablet TAKE 1 TABLET(0.5 MG) BY MOUTH THREE TIMES DAILY AS NEEDED FOR ANXIETY 90 tablet 2   meclizine (ANTIVERT) 25 MG tablet TAKE 1 TABLET(25 MG) BY MOUTH THREE TIMES DAILY AS NEEDED FOR DIZZINESS 30 tablet 2   metFORMIN (GLUCOPHAGE) 500 MG tablet Take 1 tablet (500 mg total)  by mouth daily with breakfast. 90 tablet 1   methocarbamol (ROBAXIN) 500 MG tablet TAKE 1 TABLET(500 MG) BY MOUTH EVERY 8 HOURS AS NEEDED FOR MUSCLE SPASMS 30 tablet 1   montelukast (SINGULAIR) 10 MG tablet Take 10 mg by mouth at bedtime.     NITROSTAT 0.4 MG SL tablet Place 0.4 mg under the tongue every 15 (fifteen) minutes as needed for chest pain.      omeprazole (PRILOSEC) 40 MG capsule Take 40 mg by mouth 2 (two) times daily.     ondansetron (ZOFRAN) 4 MG tablet TAKE 1 TABLET(4 MG) BY MOUTH EVERY 8 HOURS AS NEEDED FOR NAUSEA OR  VOMITING 20 tablet 0   sennosides-docusate sodium (SENOKOT-S) 8.6-50 MG tablet Take 1 tablet by mouth daily. 30 tablet 5   simethicone (GAS-X) 80 MG chewable tablet Chew 1 tablet (80 mg total) by mouth every 6 (six) hours as needed for flatulence. 30 tablet 0   Tiotropium Bromide-Olodaterol (STIOLTO RESPIMAT) 2.5-2.5 MCG/ACT AERS Inhale 2 each into the lungs daily. 4 g 11   tirzepatide (MOUNJARO) 5 MG/0.5ML Pen Inject 5 mg into the skin once a week. 6 mL 1   vitamin B-12 (CYANOCOBALAMIN) 100 MCG tablet Take 100 mcg by mouth daily.     No current facility-administered medications for this visit.   Allergies:  Sulfa antibiotics, Clindamycin/lincomycin, Dilaudid [hydromorphone hcl], Iron, Metronidazole, Prednisone, Statins, Cephalexin, Methotrexate derivatives, and Morphine and related   Social History: The patient  reports that she quit smoking about 17 years ago. Her smoking use included cigarettes. She started smoking about 56 years ago. She has a 49.00 pack-year smoking history. She has never used smokeless tobacco. She reports that she does not drink alcohol and does not use drugs.   Family History: The patient's family history includes Alcohol abuse in her father; Anxiety disorder in her sister and sister; Arthritis in her father; COPD in her father; Cancer in her mother; Depression in her brother; Lung cancer in her father and mother; Lymphoma in her father;  Miscarriages / Stillbirths in her mother; Varicose Veins in her mother.   ROS:  Please see the history of present illness. Otherwise, complete review of systems is positive for {NONE DEFAULTED:18576}.  All other systems are reviewed and negative.   Physical Exam: VS:  There were no vitals taken for this visit., BMI There is no height or weight on file to calculate BMI.  Wt Readings from Last 3 Encounters:  09/01/22 143 lb 6.4 oz (65 kg)  06/03/22 156 lb 12.8 oz (71.1 kg)  04/15/22 170 lb (77.1 kg)    General: Patient appears comfortable at rest. HEENT: Conjunctiva and lids normal, oropharynx clear with moist mucosa. Neck: Supple, no elevated JVP or carotid bruits, no thyromegaly. Lungs: Clear to auscultation, nonlabored breathing at rest. Cardiac: Regular rate and rhythm, no S3 or significant systolic murmur, no pericardial rub. Abdomen: Soft, nontender, no hepatomegaly, bowel sounds present, no guarding or rebound. Extremities: No pitting edema, distal pulses 2+. Skin: Warm and dry. Musculoskeletal: No kyphosis. Neuropsychiatric: Alert and oriented x3, affect grossly appropriate.  ECG:  An ECG dated 04/15/2022 was personally reviewed today and demonstrated:  Sinus rhythm with prolonged PR interval, R' in lead V1 with decreased R wave progression.  Recent Labwork: 05/18/2022: Hemoglobin 9.5; Platelets 369 09/01/2022: ALT 35; AST 33; BUN 22; Creatinine, Ser 1.13; Potassium 4.6; Sodium 139; TSH 3.800     Component Value Date/Time   CHOL 189 06/03/2022 1054   TRIG 141 06/03/2022 1054   HDL 57 06/03/2022 1054   CHOLHDL 3.3 06/03/2022 1054   LDLCALC 107 (H) 06/03/2022 1054    Other Studies Reviewed Today:  Cardiac catheterization 11/07/2013: 1. Left main; normal  2. LAD; normal 3. Left circumflex; nondominant and normal. There was a moderate size ramus branch which was normal as well.  4. Right coronary artery; dominant and normal 5. Left ventriculography; RAO left ventriculogram  was performed using  25 mL of Visipaque dye at 12 mL/second. The overall LVEF estimated  60 % Without wall motion abnormalities  Echocardiogram 05/02/2021:  1. Left ventricular ejection fraction, by estimation, is 65 to 70%. The  left ventricle  has normal function. The left ventricle has no regional  wall motion abnormalities. Left ventricular diastolic parameters are  indeterminate.   2. Right ventricular systolic function is normal. The right ventricular  size is normal. There is mildly elevated pulmonary artery systolic  pressure.   3. The mitral valve is normal in structure. Trivial mitral valve  regurgitation. No evidence of mitral stenosis.   4. The aortic valve was not well visualized. Aortic valve regurgitation  is not visualized. No aortic stenosis is present.   5. The inferior vena cava is normal in size with greater than 50%  respiratory variability, suggesting right atrial pressure of 3 mmHg.   Assessment and Plan:   Medication Adjustments/Labs and Tests Ordered: Current medicines are reviewed at length with the patient today.  Concerns regarding medicines are outlined above.   Tests Ordered: No orders of the defined types were placed in this encounter.   Medication Changes: No orders of the defined types were placed in this encounter.   Disposition:  Follow up {follow up:15908}  Signed, Satira Sark, MD, [facility] 09/07/2022 10:16 AM    [place] [facility] [address], [place], [place] [address] Phone: [phone]; Fax: [phone]

## 2022-09-08 ENCOUNTER — Other Ambulatory Visit: Payer: Medicare Other

## 2022-09-08 ENCOUNTER — Other Ambulatory Visit: Payer: Self-pay | Admitting: Internal Medicine

## 2022-09-08 DIAGNOSIS — E1149 Type 2 diabetes mellitus with other diabetic neurological complication: Secondary | ICD-10-CM

## 2022-09-12 ENCOUNTER — Inpatient Hospital Stay: Payer: 59

## 2022-09-12 ENCOUNTER — Ambulatory Visit: Payer: Medicare Other | Admitting: Physician Assistant

## 2022-09-16 ENCOUNTER — Ambulatory Visit: Payer: Medicare Other | Admitting: Emergency Medicine

## 2022-09-19 ENCOUNTER — Telehealth: Payer: Self-pay | Admitting: Internal Medicine

## 2022-09-19 ENCOUNTER — Telehealth: Payer: Self-pay

## 2022-09-19 NOTE — Telephone Encounter (Signed)
Called stating her blood sugar was 40. She had took one glucagon shot 7 mins ago and it had only went up to 42. Encouraged her to eat a musketeers candy bar that she had handy and after 15 mins I would check on her and see what her sugar had gone up to. Wearing CGM. She said she thinks the jardiance is causing the low blood sugar. Never had this problem until she was started on jardiance.

## 2022-09-19 NOTE — Telephone Encounter (Signed)
Patient called and sugar was at 68, blurry vision, shaking, and slurring words. Patient was advised to go to ER due to vision, patient declined. She was told to eat in two hour intervals and continue to check sugar with her finger stick not sensor. Patient to call back tomorrow

## 2022-09-19 NOTE — Telephone Encounter (Signed)
Blood sugar is now 151. She checked with a new meter. Thinks she had a faulty sensor but aware that if happens again to let us know and her meds will be adjusted

## 2022-09-20 ENCOUNTER — Telehealth: Payer: Self-pay

## 2022-09-21 ENCOUNTER — Inpatient Hospital Stay: Payer: 59 | Attending: Hematology

## 2022-09-21 DIAGNOSIS — D509 Iron deficiency anemia, unspecified: Secondary | ICD-10-CM | POA: Insufficient documentation

## 2022-09-21 LAB — IRON AND TIBC
Iron: 68 ug/dL (ref 28–170)
Saturation Ratios: 15 % (ref 10.4–31.8)
TIBC: 453 ug/dL — ABNORMAL HIGH (ref 250–450)
UIBC: 385 ug/dL

## 2022-09-21 LAB — CBC WITH DIFFERENTIAL/PLATELET
Abs Immature Granulocytes: 0.02 10*3/uL (ref 0.00–0.07)
Basophils Absolute: 0.1 10*3/uL (ref 0.0–0.1)
Basophils Relative: 1 %
Eosinophils Absolute: 0.3 10*3/uL (ref 0.0–0.5)
Eosinophils Relative: 5 %
HCT: 36.5 % (ref 36.0–46.0)
Hemoglobin: 11.4 g/dL — ABNORMAL LOW (ref 12.0–15.0)
Immature Granulocytes: 0 %
Lymphocytes Relative: 31 %
Lymphs Abs: 1.9 10*3/uL (ref 0.7–4.0)
MCH: 29 pg (ref 26.0–34.0)
MCHC: 31.2 g/dL (ref 30.0–36.0)
MCV: 92.9 fL (ref 80.0–100.0)
Monocytes Absolute: 0.3 10*3/uL (ref 0.1–1.0)
Monocytes Relative: 6 %
Neutro Abs: 3.6 10*3/uL (ref 1.7–7.7)
Neutrophils Relative %: 57 %
Platelets: 357 10*3/uL (ref 150–400)
RBC: 3.93 MIL/uL (ref 3.87–5.11)
RDW: 17.2 % — ABNORMAL HIGH (ref 11.5–15.5)
WBC: 6.2 10*3/uL (ref 4.0–10.5)
nRBC: 0 % (ref 0.0–0.2)

## 2022-09-21 LAB — FERRITIN: Ferritin: 27 ng/mL (ref 11–307)

## 2022-09-22 ENCOUNTER — Other Ambulatory Visit: Payer: Self-pay | Admitting: Internal Medicine

## 2022-09-22 DIAGNOSIS — E1149 Type 2 diabetes mellitus with other diabetic neurological complication: Secondary | ICD-10-CM

## 2022-09-22 DIAGNOSIS — J449 Chronic obstructive pulmonary disease, unspecified: Secondary | ICD-10-CM | POA: Diagnosis not present

## 2022-09-22 DIAGNOSIS — J9611 Chronic respiratory failure with hypoxia: Secondary | ICD-10-CM | POA: Diagnosis not present

## 2022-09-23 ENCOUNTER — Telehealth: Payer: Self-pay | Admitting: Internal Medicine

## 2022-09-23 ENCOUNTER — Other Ambulatory Visit: Payer: Self-pay

## 2022-09-23 MED ORDER — BLOOD GLUCOSE MONITORING SUPPL DEVI: 1.0000 | Freq: Three times a day (TID) | 0 refills | Status: DC

## 2022-09-23 MED ORDER — LANCETS MISC. MISC: 1.0000 | Freq: Three times a day (TID) | 0 refills | Status: AC

## 2022-09-23 MED ORDER — BLOOD GLUCOSE TEST VI STRP: 1.0000 | ORAL_STRIP | Freq: Three times a day (TID) | 0 refills | Status: DC

## 2022-09-23 MED ORDER — LANCET DEVICE MISC: 1.0000 | Freq: Three times a day (TID) | 0 refills | Status: AC

## 2022-09-23 NOTE — Telephone Encounter (Signed)
She had the ultrasound that was ordered back in October 2023. If the lump is a new problem the dr may have to see her again if she forgot to mention it or he can place an order for whatever he thinks she needs

## 2022-09-23 NOTE — Telephone Encounter (Signed)
Pt called stating she is needing a rx for lancets. Her insu will pay for this if it is called in. Also needs test strips for the accu check guide me. Also needs monjuaro?

## 2022-09-23 NOTE — Telephone Encounter (Signed)
Sent to pharmacy 

## 2022-09-23 NOTE — Telephone Encounter (Signed)
Patient called said she forgot to mention to the provider that she has a knot on the front of her neck and asking Scheduled a ultrasound and patient has not heard back when to go get this done.  Please return patient call [phone]

## 2022-09-24 ENCOUNTER — Other Ambulatory Visit: Payer: Self-pay | Admitting: Internal Medicine

## 2022-09-24 NOTE — Progress Notes (Signed)
VIRTUAL VISIT via [place]   I connected with Kayla Ryan  on 09/26/2022 at 3:40 PM by telephone and verified that I am speaking with the correct person using two identifiers.  Location: Patient: Home Provider: [facility]   I discussed the limitations, risks, security and privacy concerns of performing an evaluation and management service by telephone and the availability of in person appointments. I also discussed with the patient that there may be a patient responsible charge related to this service. The patient expressed understanding and agreed to proceed.  REASON FOR VISIT:  Follow-up for iron deficiency anemia  CURRENT THERAPY: IV iron infusions  INTERVAL HISTORY:   Kayla Ryan 67 y.o. female returns for routine follow-up of iron deficiency anemia.  She was last evaluated via telemedicine visit with Tarri Abernethy on 05/20/2022.  Thank you  She received low-dose IV Feraheme (90 mg) on 06/15/2022, and reported diarrhea and nausea for the next several days.  She received full dose IV Feraheme (510 mg) on 08/04/2022, and called the next day to report severe diarrhea and rash.  She did not have any shortness of breath or swelling.  Nausea was relieved with ODT Zofran, and diarrhea was improved with dicyclomine from her PCP.  She reports improved energy after her second Feraheme in December, although she does still have residual fatigue with energy about 25%.  She continues to have "coffee-ground diarrhea" once or twice a week, but denies any bright red blood per her rectum.  She has some lightheadedness if she stands too quickly but denies any syncopal episodes.  No pica, headaches,  She has 25% energy and 100% appetite. She endorses that she is maintaining a stable weight.  REVIEW OF SYSTEMS:   Review of Systems  Constitutional:  Positive for malaise/fatigue. Negative for chills, diaphoresis, fever and weight loss.  Respiratory:   Negative for cough and shortness of breath.   Cardiovascular:  Negative for chest pain and palpitations.  Gastrointestinal:  Positive for abdominal pain, constipation, diarrhea and nausea. Negative for blood in stool, melena and vomiting.  Neurological:  Positive for tingling and headaches. Negative for dizziness.     PHYSICAL EXAM: (per limitations of virtual telephone visit)  The patient is alert and oriented x 3, exhibiting adequate mentation, good mood, and ability to speak in full sentences and execute sound judgement.  ASSESSMENT & PLAN:  1.  Normocytic anemia: - Etiology chronic GI blood loss, CKD stage IIIb, and iron deficiency.  - Hospitalized in November 2022 with Hgb 5.8, s/p 4 units PRBC - Most recent blood transfusion was in January 2023. - Colonoscopy and EGD (04/30/2021) showed no obvious source of blood loss. - Capsule study (09/22/2021): Few gastric erosions, nonbleeding.  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 - She cannot take oral iron due to allergies (tongue swells, rash, loin pain).  High intolerance of IV iron with significant side effects (see below). - Most recent IV iron with Feraheme 600 mg in December 2023 - Apart from the side effects of the iron itself, she reports that she felt improved energy after her IV iron. - She has black tarry stools once or twice a week - Most recent labs (09/21/2022): Hgb 11.4/MCV 92.9, ferritin 27, iron saturation 15% with TIBC 453 - PLAN: We will try Ferrlecit 125 mg once a week x 4 weeks.  Prescription sent for ODT Zofran.  She has dicyclomine at home as prescribed by her PCP. -  Labs and RTC in 4 months - Patient advised to continue close follow-up with GI  2.  Intolerance of IV iron infusions  - Reports previous reactions to iron infusions with Venofer (severe nausea, rash, itching, myalgias). She has received IV Venofer 100 mg every 5 days in [facility] under the direction of Dr. Harlow Asa.  (Per notes by Dr. Harlow Asa  from 2020, patient was receiving monthly Venofer as maintenance.)  She is also unable to tolerate steroid premedications due to agitation and mood changes. - Patient experienced severe low back pain, nausea, vomiting, and diarrhea after IV Venofer received in July/August 2023 - Trial of Feraheme in November/December 2023, due to more favorable adverse event profile Low-dose Feraheme (90 mg) on 06/15/2022, and reported diarrhea and nausea for the next several days.   Full dose Feraheme (510 mg) on 08/04/2022, and called the next day to report severe diarrhea and rash.   - Premedication including IV Pepcid, IV cetirizine, and Tylenol. - Also requires antiemetic and anxiolytic prior to infusions Zofran and dicyclomine improved her nausea and diarrhea after iron infusions. - Patient requests to avoid IV Solu-Medrol as premedication due to intolerance of steroids with side effects of mood swings and aggression - Note that Solu-Medrol could still be used as rescue medication  3.  Other history - PMH: CKD stage IIIb.  COPD with chronic hypoxic respiratory failure (on 3 L supplemental oxygen continuously), dermatomyositis, GERD, questionable Crohn's disease but with recent EGD/colonoscopy negative for any signs of Crohn's. - She is widowed.  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  Quit smoking 20+ years ago. - Mother had lung cancer and father had lung cancer.  Niece had melanoma.  PLAN SUMMARY: >> IV Ferrlecit 125 mg weekly x 4 weeks >> Labs (CBC/D, ferritin, iron/TIBC) in 4 months >> OFFICE visit in 4 months (after labs)     I discussed the assessment and treatment plan with the patient. The patient was provided an opportunity to ask questions and all were answered. The patient agreed with the plan and demonstrated an understanding of the instructions.   The patient was advised to call back or seek an in-person evaluation if the  symptoms worsen or if the condition fails to improve as anticipated.  I provided 22 minutes of non-face-to-face time during this encounter.  Harriett Rush, PA-C 09/26/22 4:05 PM

## 2022-09-25 ENCOUNTER — Encounter: Payer: Self-pay | Admitting: Hematology

## 2022-09-26 ENCOUNTER — Encounter: Payer: Self-pay | Admitting: Physician Assistant

## 2022-09-26 ENCOUNTER — Inpatient Hospital Stay (HOSPITAL_BASED_OUTPATIENT_CLINIC_OR_DEPARTMENT_OTHER): Payer: 59 | Admitting: Physician Assistant

## 2022-09-26 VITALS — Wt 143.0 lb

## 2022-09-26 DIAGNOSIS — D509 Iron deficiency anemia, unspecified: Secondary | ICD-10-CM | POA: Diagnosis not present

## 2022-09-26 MED ORDER — ONDANSETRON 8 MG PO TBDP: 8.0000 mg | ORAL_TABLET | Freq: Three times a day (TID) | ORAL | 0 refills | Status: DC | PRN

## 2022-09-26 NOTE — Telephone Encounter (Signed)
lmom 

## 2022-09-27 ENCOUNTER — Ambulatory Visit: Payer: Self-pay | Admitting: Physician Assistant

## 2022-09-28 ENCOUNTER — Other Ambulatory Visit: Payer: Self-pay | Admitting: Internal Medicine

## 2022-09-28 ENCOUNTER — Other Ambulatory Visit: Payer: Self-pay

## 2022-09-28 DIAGNOSIS — D509 Iron deficiency anemia, unspecified: Secondary | ICD-10-CM

## 2022-09-28 DIAGNOSIS — M5030 Other cervical disc degeneration, unspecified cervical region: Secondary | ICD-10-CM

## 2022-09-29 ENCOUNTER — Ambulatory Visit: Payer: Self-pay | Admitting: Physician Assistant

## 2022-09-30 ENCOUNTER — Other Ambulatory Visit: Payer: Self-pay | Admitting: Physician Assistant

## 2022-09-30 NOTE — Progress Notes (Signed)
Patient sent MyChart message saying that she felt she handled her last Feraheme (510 mg) infusion better than any iron infusion she had had in the past.  She request to try this again rather than switching to Ferrlecit.  Treatment plan updated to give IV Feraheme 510 mg x 2.

## 2022-10-03 ENCOUNTER — Ambulatory Visit: Payer: 59 | Admitting: Internal Medicine

## 2022-10-04 ENCOUNTER — Ambulatory Visit: Payer: 59 | Admitting: Internal Medicine

## 2022-10-04 DIAGNOSIS — M47816 Spondylosis without myelopathy or radiculopathy, lumbar region: Secondary | ICD-10-CM | POA: Diagnosis not present

## 2022-10-04 DIAGNOSIS — M797 Fibromyalgia: Secondary | ICD-10-CM | POA: Diagnosis not present

## 2022-10-04 DIAGNOSIS — Z79891 Long term (current) use of opiate analgesic: Secondary | ICD-10-CM | POA: Diagnosis not present

## 2022-10-04 DIAGNOSIS — G894 Chronic pain syndrome: Secondary | ICD-10-CM | POA: Diagnosis not present

## 2022-10-11 ENCOUNTER — Inpatient Hospital Stay: Payer: 59

## 2022-10-13 ENCOUNTER — Ambulatory Visit: Payer: 59 | Admitting: Internal Medicine

## 2022-10-17 ENCOUNTER — Encounter: Payer: Self-pay | Admitting: Internal Medicine

## 2022-10-17 ENCOUNTER — Ambulatory Visit: Payer: 59 | Admitting: Internal Medicine

## 2022-10-20 ENCOUNTER — Inpatient Hospital Stay: Payer: 59

## 2022-10-20 ENCOUNTER — Other Ambulatory Visit: Payer: Self-pay | Admitting: Internal Medicine

## 2022-10-20 DIAGNOSIS — E1149 Type 2 diabetes mellitus with other diabetic neurological complication: Secondary | ICD-10-CM

## 2022-10-21 ENCOUNTER — Ambulatory Visit: Payer: 59 | Admitting: Cardiology

## 2022-10-21 DIAGNOSIS — J9611 Chronic respiratory failure with hypoxia: Secondary | ICD-10-CM | POA: Diagnosis not present

## 2022-10-21 DIAGNOSIS — J449 Chronic obstructive pulmonary disease, unspecified: Secondary | ICD-10-CM | POA: Diagnosis not present

## 2022-10-21 NOTE — Progress Notes (Deleted)
Cardiology Office Note  Date: 10/21/2022   ID: Kayla Ryan, DOB [birth year], MRN [id number]  PCP: Lindell Spar, MD  Chief Complaint: No chief complaint on file.  History of Present Illness: Kayla Ryan is a 67 y.o. female referred for cardiology consultation by Dr. Posey Pronto with reported history of HFpEF.  Her last echocardiogram in September 2022 revealed LVEF 65 to 70% without regional wall motion abnormalities, indeterminate diastolic function, no major valvular abnormalities, and estimated PASP 43 mmHg.  Notes indicate prior intolerance to [place], but doing well on Jardiance.  Cardiac catheterization by Dr. Gwenlyn Found back in 2015 revealed normal coronary arteries.  She does have a history of severe GI bleeding requiring packed red cell transfusions, seen by GI during hospitalization in 2022.  History of Crohn's disease.  Review of Systems: As outlined in the history of present illness.  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[facility])    PONV (postoperative nausea and vomiting)    Right thyroid nodule 04/07/2022   Scoliosis    Type 2 diabetes mellitus ([place])    Past Surgical History: Past Surgical History:  Procedure Laterality Date   ABDOMINAL HYSTERECTOMY     AGILE CAPSULE N/A 06/24/2021   Procedure: AGILE CAPSULE;  Surgeon: Eloise Harman, DO;  [facility];  Service: Endoscopy;  Laterality: N/A;   AGILE  CAPSULE N/A 07/22/2021   Procedure: AGILE CAPSULE;  Surgeon: Daneil Dolin, MD;  [facility];  Service: Endoscopy;  Laterality: N/A;  7:30am   bone spur     BREAST SURGERY  1995   CHOLECYSTECTOMY     COLONOSCOPY WITH PROPOFOL N/A 04/30/2021   Procedure: COLONOSCOPY WITH PROPOFOL;  Surgeon: Daneil Dolin, MD;  [facility];  Service: Endoscopy;  Laterality: N/A;   ESOPHAGEAL DILATION N/A 04/30/2021   Procedure: ESOPHAGEAL DILATION;  Surgeon: Daneil Dolin, MD;  [facility];  Service: Endoscopy;  Laterality: N/A;   ESOPHAGOGASTRODUODENOSCOPY (EGD) WITH PROPOFOL N/A 04/30/2021   Procedure: ESOPHAGOGASTRODUODENOSCOPY (EGD) WITH PROPOFOL;  Surgeon: Daneil Dolin, MD;  [facility];  Service: Endoscopy;  Laterality: N/A;   GIVENS CAPSULE STUDY N/A 09/22/2021   Procedure: GIVENS CAPSULE STUDY;  Surgeon: Daneil Dolin, MD;  [facility];  Service: Endoscopy;  Laterality: N/A;  7:30am   HERNIA REPAIR     LEFT HEART CATHETERIZATION WITH CORONARY ANGIOGRAM N/A 11/07/2013   Procedure: LEFT HEART CATHETERIZATION WITH CORONARY ANGIOGRAM;  Surgeon: Lorretta Harp, MD;  Location: [facility] C[facility];  Service: Cardiovascular;  Laterality: N/A;   MASTECTOMY PARTIAL / LUMPECTOMY     OOPHORECTOMY     ROTATOR CUFF REPAIR     TOOTH EXTRACTION N/A 04/15/2022   Procedure: DENTAL RESTORATION/EXTRACTIONS;  Surgeon: Diona Browner, DMD;  Location: [facility] OR;  Service: Oral Surgery;  Laterality: N/A;   Family History: Family History  Problem Relation Age of Onset   Lung cancer Mother    Cancer Mother    Miscarriages / Stillbirths Mother    Varicose Veins Mother    COPD Father    Lung cancer Father    Lymphoma Father    Alcohol abuse Father    Arthritis Father    Anxiety disorder Sister    Depression Brother    Anxiety disorder Sister    Social History:  Social History   Tobacco Use   Smoking status: Former    Packs/day: 1.00    Years: 49.00     Additional pack years: 0.00    Total pack years: 49.00    Types: Cigarettes    Start date: 08/08/1966    Quit date: 08/31/2005    Years since quitting: 17.1   Smokeless tobacco: Never  Substance Use Topics   Alcohol use: No   Medications: Current Outpatient Medications on File Prior to Visit  Medication Sig Dispense Refill   ACCU-CHEK GUIDE test strip USE TO CHECK BLOOD GLUCOSE THREE TIMES DAILY, MORNING, AT NOON, AND AT BEDTIME 100 strip 0   Accu-Chek Softclix Lancets lancets USE TO TEST BLOOD SUGAR EVERY MORNING, AT NOON AND EVERY NIGHT AT BEDTIME 100 each 0   acyclovir (ZOVIRAX) 400 MG tablet Take 400 mg by mouth every evening.     blood glucose meter kit and supplies Dispense based on patient and insurance preference. Once daily testing DX E11.9 1 each 0   Blood Glucose Monitoring Suppl DEVI 1 each by Does not apply route in the morning, at noon, and at bedtime. May substitute to any manufacturer covered by patient's insurance. 1 each 0   cetirizine (ZYRTEC) 10 MG tablet Take 10 mg by mouth 2 (two) times daily.     Cholecalciferol (VITAMIN D3) 50 MCG (2000 UT) capsule Take 2,000 Units by mouth daily.     Continuous Blood Gluc Receiver ([place]) DEVI To check blood glucose PRN 1 each 0   Continuous Blood Gluc Sensor (DEXCOM G7 SENSOR) MISC USE TO CHECK BLOOD SUGAR THREE TIMES DAILY BEFORE MEALS AND AT BEDTIME AND AS NEEDED. CHANGE SENSOR EVERY 10 DAYS 3 each 5   dicyclomine (BENTYL) 10 MG capsule Take 1 capsule (10 mg total) by mouth 4 (four) times daily -  before meals and at bedtime. Monitor for constipation, dry mouth, dizziness (Patient taking differently: Take 10 mg by mouth 4 (four) times daily as needed (Bowel Spasm/Diarrhea). Monitor for constipation, dry mouth, dizziness) 120 capsule 3   diphenhydrAMINE (BENADRYL) 25 MG tablet Take 25 mg by mouth every 4 (four) hours as needed for allergies or itching.     doxepin (SINEQUAN) 10 MG capsule Take 1 capsule (10 mg total) by  mouth at bedtime. 30 capsule 3   DULoxetine (CYMBALTA) 60 MG capsule Take 1 capsule (60 mg total) by mouth 2 (two) times daily. (Patient taking differently: Take 60 mg by mouth every evening.) 60 capsule 3   empagliflozin (JARDIANCE) 10 MG TABS tablet Take 1 tablet (10 mg total) by mouth daily before breakfast. 30 tablet 5   gabapentin (NEURONTIN) 800 MG tablet Take 800 mg by mouth 3 (three) times daily.     gemfibrozil (LOPID) 600 MG tablet Take 1 tablet (600 mg total) by mouth 2 (two) times daily before a meal. 180 tablet 1   Glucagon (GVOKE  HYPOPEN 2-PACK) 1 MG/0.2ML SOAJ Inject 0.2 mLs into the skin as needed (Blood glucose < 53). 0.4 mL 5   Glycerin-Hypromellose-PEG 400 (DRY EYE RELIEF DROPS OP) Place 1 drop into both eyes 3 (three) times daily as needed (Dry eye).     HYDROcodone-acetaminophen (NORCO) 10-325 MG tablet Take 1 tablet by mouth every 4 (four) hours as needed. Stop Hydrocodone 7.5/325mg  tablet. 180 tablet 0   hydrocortisone (ANUSOL-HC) 2.5 % rectal cream Place 1 Application rectally 2 (two) times daily. (Patient taking differently: Place 1 Application rectally 2 (two) times daily as needed for hemorrhoids or anal itching.) 30 g 1   ipratropium (ATROVENT HFA) 17 MCG/ACT inhaler Inhale 2 puffs into the lungs every 4 (four) hours as needed (COPD).     isosorbide dinitrate (ISORDIL) 30 MG tablet TAKE 1 TABLET(30 MG) BY MOUTH DAILY 90 tablet 1   ketoconazole (NIZORAL) 2 % cream Apply 1 Application topically daily. 30 g 0   Lancet Device MISC 1 each by Does not apply route in the morning, at noon, and at bedtime. May substitute to any manufacturer covered by patient's insurance. 1 each 0   Lancets Misc. (ACCU-CHEK SOFTCLIX LANCET DEV) KIT USE TO CHECK GLUCOSE THREE TIMES DAILY 1 kit 0   Lancets Misc. MISC 1 each by Does not apply route in the morning, at noon, and at bedtime. May substitute to any manufacturer covered by patient's insurance. 100 each 0   LORazepam (ATIVAN) 0.5 MG tablet  TAKE 1 TABLET(0.5 MG) BY MOUTH THREE TIMES DAILY AS NEEDED FOR ANXIETY 90 tablet 2   meclizine (ANTIVERT) 25 MG tablet TAKE 1 TABLET(25 MG) BY MOUTH THREE TIMES DAILY AS NEEDED FOR DIZZINESS 30 tablet 2   metFORMIN (GLUCOPHAGE) 500 MG tablet TAKE 1 TABLET(500 MG) BY MOUTH TWICE DAILY WITH A MEAL 60 tablet 2   methocarbamol (ROBAXIN) 500 MG tablet TAKE 1 TABLET(500 MG) BY MOUTH EVERY 8 HOURS AS NEEDED FOR MUSCLE SPASMS 30 tablet 1   montelukast (SINGULAIR) 10 MG tablet Take 10 mg by mouth at bedtime.     MOUNJARO 5 MG/0.5ML Pen INJECT 5 MG UNDER THE SKIN ONCE A WEEK 6 mL 1   NITROSTAT 0.4 MG SL tablet Place 0.4 mg under the tongue every 15 (fifteen) minutes as needed for chest pain.      omeprazole (PRILOSEC) 40 MG capsule Take 40 mg by mouth 2 (two) times daily.     ondansetron (ZOFRAN) 4 MG tablet TAKE 1 TABLET(4 MG) BY MOUTH EVERY 8 HOURS AS NEEDED FOR NAUSEA OR VOMITING 20 tablet 0   ondansetron (ZOFRAN-ODT) 8 MG disintegrating tablet Take 1 tablet (8 mg total) by mouth every 8 (eight) hours as needed for nausea or vomiting. 20 tablet 0   sennosides-docusate sodium (SENOKOT-S) 8.6-50 MG tablet Take 1 tablet by mouth daily. 30 tablet 5   simethicone (GAS-X) 80 MG chewable tablet Chew 1 tablet (80 mg total) by mouth every 6 (six) hours as needed for flatulence. 30 tablet 0   Tiotropium Bromide-Olodaterol (STIOLTO RESPIMAT) 2.5-2.5 MCG/ACT AERS Inhale 2 each into the lungs daily. 4 g 11   vitamin B-12 (CYANOCOBALAMIN) 100 MCG tablet Take 100 mcg by mouth daily.     No current facility-administered medications on file prior to visit.   Allergies: Allergies  Allergen Reactions   Sulfa Antibiotics Shortness Of Breath, Swelling and Rash   Clindamycin/Lincomycin    Dilaudid [Hydromorphone Hcl]     Makes me crazy   Iron     Throat  starts closing up, tongue swells, severe headaches and backpain   Metronidazole Diarrhea and Nausea And Vomiting   Prednisone Other (See Comments)    Angry      Statins Other (See Comments)    Unknown    Cephalexin Diarrhea and Nausea And Vomiting   Methotrexate Derivatives Swelling and Rash   Morphine And Related Anxiety    "exreme mood swings"   Physical Exam: VS:  There were no vitals taken for this visit., BMI There is no height or weight on file to calculate BMI.  Wt Readings from Last 3 Encounters:  09/26/22 143 lb (64.9 kg)  09/01/22 143 lb 6.4 oz (65 kg)  06/03/22 156 lb 12.8 oz (71.1 kg)    General: Patient appears comfortable at rest. HEENT: Conjunctiva and lids normal, oropharynx clear with moist mucosa. Neck: Supple, no elevated JVP or carotid bruits, no thyromegaly. Lungs: Clear to auscultation, nonlabored breathing at rest. Cardiac: Regular rate and rhythm, no S3 or significant systolic murmur, no pericardial rub. Abdomen: Soft, nontender, no hepatomegaly, bowel sounds present, no guarding or rebound. Extremities: No pitting edema, distal pulses 2+. Skin: Warm and dry. Musculoskeletal: No kyphosis. Neuropsychiatric: Alert and oriented x3, affect grossly appropriate.  ECG:  An ECG dated 04/15/2022 was personally reviewed today and demonstrated:  Sinus rhythm with prolonged PR interval, R' in lead V1 and V2.  Labwork: 09/01/2022: ALT 35; AST 33; BUN 22; Creatinine, Ser 1.13; Potassium 4.6; Sodium 139; TSH 3.800 09/21/2022: Hemoglobin 11.4; Platelets 357     Component Value Date/Time   CHOL 189 06/03/2022 1054   TRIG 141 06/03/2022 1054   HDL 57 06/03/2022 1054   CHOLHDL 3.3 06/03/2022 1054   LDLCALC 107 (H) 06/03/2022 1054   Other Studies Reviewed Today:  Cardiac catheterization 11/07/2013: ANGIOGRAPHIC RESULTS:    1. Left main; normal  2. LAD; normal 3. Left circumflex; nondominant and normal. There was a moderate size ramus branch which was normal as well.  4. Right coronary artery; dominant and normal 5. Left ventriculography; RAO left ventriculogram was performed using  25 mL of Visipaque dye at 12 mL/second. The  overall LVEF estimated  60 % Without wall motion abnormalities  Echocardiogram 05/02/2021:  1. Left ventricular ejection fraction, by estimation, is 65 to 70%. The  left ventricle has normal function. The left ventricle has no regional  wall motion abnormalities. Left ventricular diastolic parameters are  indeterminate.   2. Right ventricular systolic function is normal. The right ventricular  size is normal. There is mildly elevated pulmonary artery systolic  pressure.   3. The mitral valve is normal in structure. Trivial mitral valve  regurgitation. No evidence of mitral stenosis.   4. The aortic valve was not well visualized. Aortic valve regurgitation  is not visualized. No aortic stenosis is present.   5. The inferior vena cava is normal in size with greater than 50%  respiratory variability, suggesting right atrial pressure of 3 mmHg.  Assessment and Plan:    Disposition:  Follow up {follow up:15908}  Signed, Satira Sark, M.D., F.A.C.C.

## 2022-10-26 ENCOUNTER — Telehealth: Payer: Self-pay | Admitting: Internal Medicine

## 2022-10-26 ENCOUNTER — Ambulatory Visit: Payer: 59 | Admitting: Internal Medicine

## 2022-10-26 NOTE — Telephone Encounter (Signed)
Pt has missed 3 appts (05/18/22, 10/17/22 & 10/26/22). She was r/s for 3/26. How would you like to proceed?

## 2022-10-27 ENCOUNTER — Inpatient Hospital Stay: Payer: 59

## 2022-10-28 ENCOUNTER — Ambulatory Visit: Payer: Medicare Other | Admitting: Emergency Medicine

## 2022-10-28 ENCOUNTER — Encounter: Payer: Self-pay | Admitting: Internal Medicine

## 2022-11-01 ENCOUNTER — Ambulatory Visit (INDEPENDENT_AMBULATORY_CARE_PROVIDER_SITE_OTHER): Payer: 59 | Admitting: Internal Medicine

## 2022-11-01 ENCOUNTER — Encounter: Payer: Self-pay | Admitting: Internal Medicine

## 2022-11-01 VITALS — BP 115/51 | HR 88 | Ht 68.0 in | Wt 146.2 lb

## 2022-11-01 DIAGNOSIS — R5381 Other malaise: Secondary | ICD-10-CM | POA: Diagnosis not present

## 2022-11-01 DIAGNOSIS — E1149 Type 2 diabetes mellitus with other diabetic neurological complication: Secondary | ICD-10-CM | POA: Diagnosis not present

## 2022-11-01 DIAGNOSIS — R39198 Other difficulties with micturition: Secondary | ICD-10-CM | POA: Diagnosis not present

## 2022-11-01 DIAGNOSIS — J019 Acute sinusitis, unspecified: Secondary | ICD-10-CM | POA: Diagnosis not present

## 2022-11-01 DIAGNOSIS — D170 Benign lipomatous neoplasm of skin and subcutaneous tissue of head, face and neck: Secondary | ICD-10-CM | POA: Insufficient documentation

## 2022-11-01 DIAGNOSIS — Z87448 Personal history of other diseases of urinary system: Secondary | ICD-10-CM

## 2022-11-01 DIAGNOSIS — E041 Nontoxic single thyroid nodule: Secondary | ICD-10-CM

## 2022-11-01 DIAGNOSIS — K429 Umbilical hernia without obstruction or gangrene: Secondary | ICD-10-CM | POA: Diagnosis not present

## 2022-11-01 MED ORDER — AZITHROMYCIN 250 MG PO TABS: ORAL_TABLET | ORAL | 0 refills | Status: AC

## 2022-11-01 MED ORDER — MISC. DEVICES KIT: PACK | 0 refills | Status: AC

## 2022-11-01 NOTE — Progress Notes (Signed)
Established Patient Office Visit  Subjective:  Patient ID: Kayla Ryan, female    DOB: 12/13/1955  Age: 67 y.o. MRN: [id number]  CC:  Chief Complaint  Patient presents with   Diabetes    Follow up on diabetes. Patient states she has green snot. She also states her hernia in her naval is in severe pain     HPI Kayla Ryan is a 67 y.o. female with past medical history of HTN, HFpEF, COPD, GERD, type II DM with neuropathy, CKD, GAD and chronic pain syndrome who presents for f/u of her chronic medical conditions.  Type II DM: She has been taking Mounjaro, and has been tolerating it well.  Her blood glucose has improved now, but had  episodes of hypoglycemia. She has a CGM.  She is tolerating Jardiance now.  She has chronic numbness of bilateral foot.  She takes gabapentin 800 mg daily currently.  Denies any polyuria or polyphagia.  She has chronic fatigue.  GAD: She takes Ativan 0.5 mg 3 times daily as needed. She is also on Cymbalta.   She has history of COPD and chronic hypoxic respiratory failure.  She has acute Stiolto and as needed albuterol.  She uses 3 LPM O2 at home.  She complains of nasal congestion, yellowish-green nasal drainage and postnasal drip.  Denies any fever or chills.  She also reports increasing size of the mass on the right side of the neck.  She has history of thyroid nodule, which was enlarged compared to prior US thyroid recently.  She has had mild difficulty swallowing as well.  Denies any recent change in weight or appetite, tremors or palpitations.  She also reports discomfort around umbilical area and reports history of hernia.  Her pain is worse with bending.  Denies any heavy lifting recently.  She reports history of cystocele and has difficulty urinating.  She has had cystocele repair surgery in the past. She denies any dysuria or hematuria currently.  She has not seen urologist recently.  Past Medical History:  Diagnosis Date   Allergy Unknown    Anemia    Anxiety 1883   Arthritis    Asthma    Chronic bronchitis    Crohn disease ([place])    Emphysema    Fibromyalgia    GERD (gastroesophageal reflux disease)    GI bleed    Herpes    History of blood transfusion    Hyperlipidemia    Hypertension 2015   IBS (irritable bowel syndrome)    Kidney failure    Migraines    Muscular dystrophy ([place])    Neck pain    PAF (paroxysmal atrial fibrillation) ([place])    Not anticoagulated with history of severe GI bleeding   Plantar fasciitis    Polymyositis (HCC)    PONV (postoperative nausea and vomiting)    Right thyroid nodule 04/07/2022   Scoliosis    Type 2 diabetes mellitus ([place])     Past Surgical History:  Procedure Laterality Date   ABDOMINAL HYSTERECTOMY     AGILE CAPSULE N/A 06/24/2021   Procedure: AGILE CAPSULE;  Surgeon: Eloise Harman, DO;  [facility];  Service: Endoscopy;  Laterality: N/A;   AGILE CAPSULE N/A 07/22/2021   Procedure: AGILE CAPSULE;  Surgeon: Daneil Dolin, MD;  [facility];  Service: Endoscopy;  Laterality: N/A;  7:30am   bone spur     BREAST SURGERY  1995   CHOLECYSTECTOMY     COLONOSCOPY WITH  PROPOFOL N/A 04/30/2021   Procedure: COLONOSCOPY WITH PROPOFOL;  Surgeon: Daneil Dolin, MD;  [facility];  Service: Endoscopy;  Laterality: N/A;   ESOPHAGEAL DILATION N/A 04/30/2021   Procedure: ESOPHAGEAL DILATION;  Surgeon: Daneil Dolin, MD;  [facility];  Service: Endoscopy;  Laterality: N/A;   ESOPHAGOGASTRODUODENOSCOPY (EGD) WITH PROPOFOL N/A 04/30/2021   Procedure: ESOPHAGOGASTRODUODENOSCOPY (EGD) WITH PROPOFOL;  Surgeon: Daneil Dolin, MD;  [facility];  Service: Endoscopy;  Laterality: N/A;   GIVENS CAPSULE STUDY N/A 09/22/2021   Procedure: GIVENS CAPSULE STUDY;  Surgeon: Daneil Dolin, MD;  [facility];  Service: Endoscopy;  Laterality: N/A;  7:30am   HERNIA REPAIR     LEFT HEART CATHETERIZATION WITH CORONARY ANGIOGRAM  N/A 11/07/2013   Procedure: LEFT HEART CATHETERIZATION WITH CORONARY ANGIOGRAM;  Surgeon: Lorretta Harp, MD;  Location: [facility];  Service: Cardiovascular;  Laterality: N/A;   MASTECTOMY PARTIAL / LUMPECTOMY     OOPHORECTOMY     ROTATOR CUFF REPAIR     TOOTH EXTRACTION N/A 04/15/2022   Procedure: DENTAL RESTORATION/EXTRACTIONS;  Surgeon: Diona Browner, DMD;  Location: [facility];  Service: Oral Surgery;  Laterality: N/A;    Family History  Problem Relation Age of Onset   Lung cancer Mother    Cancer Mother    Miscarriages / [place] Mother    Varicose Veins Mother    COPD Father    Lung cancer Father    Lymphoma Father    Alcohol abuse Father    Arthritis Father    Anxiety disorder Sister    Depression Brother    Anxiety disorder Sister     Social History   Socioeconomic History   Marital status: Widowed    Spouse name: Not on file   Number of children: Not on file   Years of education: Not on file   Highest education level: Associate degree: occupational, Hotel manager, or vocational program  Occupational History   Not on file  Tobacco Use   Smoking status: Former    Packs/day: 1.00    Years: 49.00    Additional pack years: 0.00    Total pack years: 49.00    Types: Cigarettes    Start date: 08/08/1966    Quit date: 08/31/2005    Years since quitting: 17.1   Smokeless tobacco: Never  Vaping Use   Vaping Use: Never used  Substance and Sexual Activity   Alcohol use: No   Drug use: No    Comment: used marijuana in teens   Sexual activity: Not Currently    Birth control/protection: Surgical  Other Topics Concern   Not on file  Social History Narrative   Not on file   Social Determinants of Health   Financial Resource Strain: Low Risk  (10/28/2022)   Overall Financial Resource Strain (CARDIA)    Difficulty of Paying Living Expenses: Not very hard  Food Insecurity: Food Insecurity Present (10/28/2022)   Hunger Vital Sign    Worried About [place] in the  Last Year: Sometimes true    Ran Out of Food in the Last Year: Sometimes true  Transportation Needs: No Transportation Needs (10/28/2022)   PRAPARE - Hydrologist (Medical): No    Lack of Transportation (Non-Medical): No  Physical Activity: Insufficiently Active (10/28/2022)   Exercise Vital Sign    Days of Exercise per Week: 1 day    Minutes of Exercise per Session: 10  min  Stress: Stress Concern Present (10/28/2022)   [place]    Feeling of Stress : To some extent  Social Connections: Unknown (10/28/2022)   Social Connection and Isolation Panel [NHANES]    Frequency of Communication with Friends and Family: Twice a week    Frequency of Social Gatherings with Friends and Family: Never    Attends Religious Services: Patient declined    Marine scientist or Organizations: Patient declined    Attends Archivist Meetings: Never    Marital Status: Widowed  Intimate Partner Violence: Not At Risk (03/14/2022)   Humiliation, Afraid, Rape, and Kick questionnaire    Fear of Current or Ex-Partner: No    Emotionally Abused: No    Physically Abused: No    Sexually Abused: No    Outpatient Medications Prior to Visit  Medication Sig Dispense Refill   ACCU-CHEK GUIDE test strip USE TO CHECK BLOOD GLUCOSE THREE TIMES DAILY, MORNING, AT NOON, AND AT BEDTIME 100 strip 0   Accu-Chek Softclix Lancets lancets USE TO TEST BLOOD SUGAR EVERY MORNING, AT NOON AND EVERY NIGHT AT BEDTIME 100 each 0   acyclovir (ZOVIRAX) 400 MG tablet Take 400 mg by mouth every evening.     cetirizine (ZYRTEC) 10 MG tablet Take 10 mg by mouth 2 (two) times daily.     Cholecalciferol (VITAMIN D3) 50 MCG (2000 UT) capsule Take 2,000 Units by mouth daily.     Continuous Blood Gluc Receiver ([place]) DEVI To check blood glucose PRN 1 each 0   Continuous Blood Gluc Sensor (DEXCOM G7 SENSOR) MISC USE TO CHECK BLOOD  SUGAR THREE TIMES DAILY BEFORE MEALS AND AT BEDTIME AND AS NEEDED. CHANGE SENSOR EVERY 10 DAYS 3 each 5   dicyclomine (BENTYL) 10 MG capsule Take 1 capsule (10 mg total) by mouth 4 (four) times daily -  before meals and at bedtime. Monitor for constipation, dry mouth, dizziness (Patient taking differently: Take 10 mg by mouth 4 (four) times daily as needed (Bowel Spasm/Diarrhea). Monitor for constipation, dry mouth, dizziness) 120 capsule 3   diphenhydrAMINE (BENADRYL) 25 MG tablet Take 25 mg by mouth every 4 (four) hours as needed for allergies or itching.     DULoxetine (CYMBALTA) 60 MG capsule Take 1 capsule (60 mg total) by mouth 2 (two) times daily. (Patient taking differently: Take 60 mg by mouth every evening.) 60 capsule 3   empagliflozin (JARDIANCE) 10 MG TABS tablet Take 1 tablet (10 mg total) by mouth daily before breakfast. 30 tablet 5   gabapentin (NEURONTIN) 800 MG tablet Take 800 mg by mouth 3 (three) times daily.     gemfibrozil (LOPID) 600 MG tablet Take 1 tablet (600 mg total) by mouth 2 (two) times daily before a meal. 180 tablet 1   Glucagon (GVOKE HYPOPEN 2-PACK) 1 MG/0.2ML SOAJ Inject 0.2 mLs into the skin as needed (Blood glucose < 53). 0.4 mL 5   Glycerin-Hypromellose-PEG 400 (DRY EYE RELIEF DROPS OP) Place 1 drop into both eyes 3 (three) times daily as needed (Dry eye).     HYDROcodone-acetaminophen (NORCO) 10-325 MG tablet Take 1 tablet by mouth every 4 (four) hours as needed. Stop Hydrocodone 7.5/325mg  tablet. 180 tablet 0   hydrocortisone (ANUSOL-HC) 2.5 % rectal cream Place 1 Application rectally 2 (two) times daily. (Patient taking differently: Place 1 Application rectally 2 (two) times daily as needed for hemorrhoids or anal itching.) 30 g 1   ipratropium (ATROVENT  HFA) 17 MCG/ACT inhaler Inhale 2 puffs into the lungs every 4 (four) hours as needed (COPD).     isosorbide dinitrate (ISORDIL) 30 MG tablet TAKE 1 TABLET(30 MG) BY MOUTH DAILY 90 tablet 1   Lancets Misc.  (ACCU-CHEK SOFTCLIX LANCET DEV) KIT USE TO CHECK GLUCOSE THREE TIMES DAILY 1 kit 0   LORazepam (ATIVAN) 0.5 MG tablet TAKE 1 TABLET(0.5 MG) BY MOUTH THREE TIMES DAILY AS NEEDED FOR ANXIETY 90 tablet 2   meclizine (ANTIVERT) 25 MG tablet TAKE 1 TABLET(25 MG) BY MOUTH THREE TIMES DAILY AS NEEDED FOR DIZZINESS 30 tablet 2   metFORMIN (GLUCOPHAGE) 500 MG tablet TAKE 1 TABLET(500 MG) BY MOUTH TWICE DAILY WITH A MEAL 60 tablet 2   methocarbamol (ROBAXIN) 500 MG tablet TAKE 1 TABLET(500 MG) BY MOUTH EVERY 8 HOURS AS NEEDED FOR MUSCLE SPASMS 30 tablet 1   montelukast (SINGULAIR) 10 MG tablet Take 10 mg by mouth at bedtime.     MOUNJARO 5 MG/0.5ML Pen INJECT 5 MG UNDER THE SKIN ONCE A WEEK 6 mL 1   NITROSTAT 0.4 MG SL tablet Place 0.4 mg under the tongue every 15 (fifteen) minutes as needed for chest pain.      omeprazole (PRILOSEC) 40 MG capsule Take 40 mg by mouth 2 (two) times daily.     ondansetron (ZOFRAN-ODT) 8 MG disintegrating tablet Take 1 tablet (8 mg total) by mouth every 8 (eight) hours as needed for nausea or vomiting. 20 tablet 0   sennosides-docusate sodium (SENOKOT-S) 8.6-50 MG tablet Take 1 tablet by mouth daily. 30 tablet 5   simethicone (GAS-X) 80 MG chewable tablet Chew 1 tablet (80 mg total) by mouth every 6 (six) hours as needed for flatulence. 30 tablet 0   Tiotropium Bromide-Olodaterol (STIOLTO RESPIMAT) 2.5-2.5 MCG/ACT AERS Inhale 2 each into the lungs daily. 4 g 11   vitamin B-12 (CYANOCOBALAMIN) 100 MCG tablet Take 100 mcg by mouth daily.     blood glucose meter kit and supplies Dispense based on patient and insurance preference. Once daily testing DX E11.9 1 each 0   Blood Glucose Monitoring Suppl DEVI 1 each by Does not apply route in the morning, at noon, and at bedtime. May substitute to any manufacturer covered by patient's insurance. 1 each 0   doxepin (SINEQUAN) 10 MG capsule Take 1 capsule (10 mg total) by mouth at bedtime. 30 capsule 3   ketoconazole (NIZORAL) 2 % cream  Apply 1 Application topically daily. 30 g 0   ondansetron (ZOFRAN) 4 MG tablet TAKE 1 TABLET(4 MG) BY MOUTH EVERY 8 HOURS AS NEEDED FOR NAUSEA OR VOMITING 20 tablet 0   No facility-administered medications prior to visit.    Allergies  Allergen Reactions   Sulfa Antibiotics Shortness Of Breath, Swelling and Rash   Clindamycin/Lincomycin    Dilaudid [Hydromorphone Hcl]     Makes me crazy   Iron     Throat starts closing up, tongue swells, severe headaches and backpain   Metronidazole Diarrhea and Nausea And Vomiting   Prednisone Other (See Comments)    Angry     Statins Other (See Comments)    Unknown    Cephalexin Diarrhea and Nausea And Vomiting   Methotrexate Derivatives Swelling and Rash   Morphine And Related Anxiety    "exreme mood swings"    ROS Review of Systems  Constitutional:  Positive for fatigue. Negative for chills and fever.  HENT:  Positive for congestion and sinus pressure. Negative for sinus pain and sore  throat.   Eyes:  Negative for pain and discharge.  Respiratory:  Positive for shortness of breath. Negative for cough.   Cardiovascular:  Negative for chest pain and palpitations.  Gastrointestinal:  Positive for constipation. Negative for abdominal pain, nausea and vomiting.  Endocrine: Negative for polydipsia and polyuria.  Genitourinary:  Positive for difficulty urinating. Negative for dysuria and hematuria.  Musculoskeletal:  Positive for arthralgias, back pain, gait problem and neck pain. Negative for neck stiffness.  Skin:  Negative for rash.  Neurological:  Positive for dizziness. Negative for weakness.  Psychiatric/Behavioral:  Positive for sleep disturbance. Negative for agitation and behavioral problems. The patient is nervous/anxious.       Objective:    Physical Exam Vitals reviewed.  Constitutional:      General: She is not in acute distress.    Appearance: She is not diaphoretic.  HENT:     Head: Normocephalic and atraumatic.      Nose: Congestion present.     Right Sinus: Frontal sinus tenderness present.     Left Sinus: Frontal sinus tenderness present.     Mouth/Throat:     Mouth: Mucous membranes are moist.  Eyes:     General: No scleral icterus.    Extraocular Movements: Extraocular movements intact.  Neck:     Comments: Right-sided neck mass - thyroid nodule, nontender, about 4 cm in diameter Cardiovascular:     Rate and Rhythm: Normal rate and regular rhythm.     Pulses: Normal pulses.     Heart sounds: Normal heart sounds. No murmur heard. Pulmonary:     Breath sounds: Normal breath sounds. No wheezing or rales.     Comments: On 3 LPM O2 Musculoskeletal:     Cervical back: Neck supple. No tenderness.     Right lower leg: No edema.     Left lower leg: No edema.  Skin:    General: Skin is warm.     Findings: No rash.  Neurological:     General: No focal deficit present.     Mental Status: She is alert and oriented to person, place, and time.     Sensory: Sensory deficit present.     Motor: Weakness (4/5 in b/l UE and LE) present.  Psychiatric:        Mood and Affect: Mood normal.        Behavior: Behavior normal.     BP (!) 115/51 (BP Location: Right Arm, Patient Position: Sitting, Cuff Size: Normal)   Pulse 88   Ht 5\' 8"  (1.727 m)   Wt 146 lb 3.2 oz (66.3 kg)   SpO2 95%   BMI 22.23 kg/m  Wt Readings from Last 3 Encounters:  11/01/22 146 lb 3.2 oz (66.3 kg)  09/26/22 143 lb (64.9 kg)  09/01/22 143 lb 6.4 oz (65 kg)    Lab Results  Component Value Date   TSH 3.800 09/01/2022   Lab Results  Component Value Date   WBC 6.2 09/21/2022   HGB 11.4 (L) 09/21/2022   HCT 36.5 09/21/2022   MCV 92.9 09/21/2022   PLT 357 09/21/2022   Lab Results  Component Value Date   NA 139 09/01/2022   K 4.6 09/01/2022   CO2 25 09/01/2022   GLUCOSE 82 09/01/2022   BUN 22 09/01/2022   CREATININE 1.13 (H) 09/01/2022   BILITOT 0.2 09/01/2022   ALKPHOS 59 09/01/2022   AST 33 09/01/2022   ALT 35  (H) 09/01/2022   PROT 7.9 09/01/2022  ALBUMIN 5.1 (H) 09/01/2022   CALCIUM 10.0 09/01/2022   ANIONGAP 13 04/15/2022   EGFR 53 (L) 09/01/2022   GFR 58.40 (L) 01/28/2021   Lab Results  Component Value Date   CHOL 189 06/03/2022   Lab Results  Component Value Date   HDL 57 06/03/2022   Lab Results  Component Value Date   LDLCALC 107 (H) 06/03/2022   Lab Results  Component Value Date   TRIG 141 06/03/2022   Lab Results  Component Value Date   CHOLHDL 3.3 06/03/2022   Lab Results  Component Value Date   HGBA1C 5.2 09/01/2022      Assessment & Plan:   Problem List Items Addressed This Visit       Respiratory   Acute sinusitis    Started empiric azithromycin as she has persistent symptoms despite symptomatic treatment Nasal saline spray as needed for nasal congestion      Relevant Medications   azithromycin (ZITHROMAX) 250 MG tablet     Endocrine   Type 2 diabetes mellitus with neurological complications ([place]) - Primary    Lab Results  Component Value Date   HGBA1C 5.2 09/01/2022  Well controlled now On Metformin and Mounjaro 5 mg qw Has had episodes of hypoglycemia in the past, has CGM now, 2 recent episode of hypoglycemia -decreased metformin from 500 mg twice daily to once daily as her hypoglycemia episodes are mostly in the early morning  On Jardiance for HFpEF (did not tolerate Farxiga 10 mg) Advised to follow diabetic diet Has statin intolerance F/u CMP and lipid panel Diabetic eye exam: Advised to follow up with Ophthalmology for diabetic eye exam  Takes gabapentin 800 mg QD, advised to take it twice daily as she has persistent numbness and tingling of bilateral LE      Relevant Medications   Misc. Devices KIT   Right thyroid nodule    Recent US of thyroid showed 3.2 cm thyroid nodule TSH wnl Ordered US thyroid as she has noticed increase in size of the neck mass and has mild dysphagia now      Relevant Orders   US THYROID     Other    Umbilical hernia without obstruction and without gangrene    Not apparent hernia/bulge today Could be umbilical diastases Reassured Advised to avoid heavy lifting and frequent bending      History of cystocele    Has difficulty urinating Referred to urology      Relevant Orders   Ambulatory referral to Urology   Physical deconditioning    Has diabetic neuropathy and OA of multiple joints Has chronic leg weakness Would benefit from rolling walker      Relevant Medications   Misc. Devices KIT   Other Visit Diagnoses     Difficulty urinating       Relevant Orders   Ambulatory referral to Urology      Meds ordered this encounter  Medications   azithromycin (ZITHROMAX) 250 MG tablet    Sig: Take 2 tablets on day 1, then 1 tablet daily on days 2 through 5    Dispense:  6 tablet    Refill:  0   Misc. Devices KIT    Sig: Henmnii rollator walker    Dispense:  1 kit    Refill:  0    Follow-up: Return if symptoms worsen or fail to improve.    Lindell Spar, MD

## 2022-11-01 NOTE — Assessment & Plan Note (Addendum)
Lab Results  Component Value Date   HGBA1C 5.2 09/01/2022   Well controlled now On Metformin and Mounjaro 5 mg qw Has had episodes of hypoglycemia in the past, has CGM now, 2 recent episode of hypoglycemia -decreased metformin from 500 mg twice daily to once daily as her hypoglycemia episodes are mostly in the early morning  On Jardiance for HFpEF (did not tolerate Farxiga 10 mg) Advised to follow diabetic diet Has statin intolerance F/u CMP and lipid panel Diabetic eye exam: Advised to follow up with Ophthalmology for diabetic eye exam  Takes gabapentin 800 mg QD, advised to take it twice daily as she has persistent numbness and tingling of bilateral LE

## 2022-11-01 NOTE — Patient Instructions (Signed)
Please start taking Azithromycin as prescribed.  Please use nasal saline spray as needed for nasal congestion.  You are being referred to Urology for difficulty urinating.

## 2022-11-03 ENCOUNTER — Other Ambulatory Visit: Payer: Self-pay | Admitting: Internal Medicine

## 2022-11-04 ENCOUNTER — Inpatient Hospital Stay: Payer: 59

## 2022-11-04 DIAGNOSIS — J019 Acute sinusitis, unspecified: Secondary | ICD-10-CM | POA: Insufficient documentation

## 2022-11-04 DIAGNOSIS — R5381 Other malaise: Secondary | ICD-10-CM | POA: Insufficient documentation

## 2022-11-04 NOTE — Assessment & Plan Note (Signed)
Started empiric azithromycin as she has persistent symptoms despite symptomatic treatment Nasal saline spray as needed for nasal congestion

## 2022-11-04 NOTE — Assessment & Plan Note (Signed)
Has difficulty urinating Referred to urology

## 2022-11-04 NOTE — Assessment & Plan Note (Signed)
Recent US of thyroid showed 3.2 cm thyroid nodule TSH wnl Ordered US thyroid as she has noticed increase in size of the neck mass and has mild dysphagia now

## 2022-11-04 NOTE — Assessment & Plan Note (Signed)
Has diabetic neuropathy and OA of multiple joints Has chronic leg weakness Would benefit from rolling walker

## 2022-11-04 NOTE — Assessment & Plan Note (Signed)
Not apparent hernia/bulge today Could be umbilical diastases Reassured Advised to avoid heavy lifting and frequent bending

## 2022-11-07 ENCOUNTER — Other Ambulatory Visit: Payer: Self-pay | Admitting: Physician Assistant

## 2022-11-09 ENCOUNTER — Encounter: Payer: Self-pay | Admitting: Hematology

## 2022-11-10 ENCOUNTER — Ambulatory Visit (HOSPITAL_COMMUNITY): Payer: 59

## 2022-11-11 ENCOUNTER — Inpatient Hospital Stay: Payer: 59

## 2022-11-17 ENCOUNTER — Other Ambulatory Visit: Payer: Self-pay | Admitting: Hematology

## 2022-11-17 ENCOUNTER — Ambulatory Visit (HOSPITAL_COMMUNITY): Payer: 59

## 2022-11-18 ENCOUNTER — Inpatient Hospital Stay: Payer: 59

## 2022-11-21 ENCOUNTER — Ambulatory Visit (HOSPITAL_COMMUNITY): Payer: 59

## 2022-11-21 DIAGNOSIS — J449 Chronic obstructive pulmonary disease, unspecified: Secondary | ICD-10-CM | POA: Diagnosis not present

## 2022-11-21 DIAGNOSIS — J9611 Chronic respiratory failure with hypoxia: Secondary | ICD-10-CM | POA: Diagnosis not present

## 2022-11-23 ENCOUNTER — Ambulatory Visit: Payer: Medicare Other | Admitting: Emergency Medicine

## 2022-11-25 ENCOUNTER — Inpatient Hospital Stay: Payer: 59

## 2022-11-28 ENCOUNTER — Other Ambulatory Visit: Payer: Self-pay | Admitting: Hematology

## 2022-11-28 ENCOUNTER — Telehealth: Payer: Self-pay | Admitting: Internal Medicine

## 2022-11-28 ENCOUNTER — Other Ambulatory Visit: Payer: Self-pay | Admitting: Internal Medicine

## 2022-11-28 ENCOUNTER — Other Ambulatory Visit: Payer: Self-pay

## 2022-11-28 ENCOUNTER — Ambulatory Visit (HOSPITAL_COMMUNITY): Payer: 59

## 2022-11-28 DIAGNOSIS — F411 Generalized anxiety disorder: Secondary | ICD-10-CM

## 2022-11-28 DIAGNOSIS — R42 Dizziness and giddiness: Secondary | ICD-10-CM

## 2022-11-28 MED ORDER — GABAPENTIN 800 MG PO TABS: 800.0000 mg | ORAL_TABLET | Freq: Three times a day (TID) | ORAL | 0 refills | Status: DC

## 2022-11-28 NOTE — Telephone Encounter (Signed)
Pt has an upcoming appt in May. She will need a refill before then gabapentin (NEURONTIN) 800 MG tablet [[id number]]

## 2022-11-28 NOTE — Telephone Encounter (Signed)
Refills sent to pharmacy. 

## 2022-12-02 ENCOUNTER — Inpatient Hospital Stay: Payer: 59

## 2022-12-04 ENCOUNTER — Other Ambulatory Visit: Payer: Self-pay | Admitting: Internal Medicine

## 2022-12-04 DIAGNOSIS — I25118 Atherosclerotic heart disease of native coronary artery with other forms of angina pectoris: Secondary | ICD-10-CM

## 2022-12-05 ENCOUNTER — Other Ambulatory Visit: Payer: Self-pay | Admitting: Internal Medicine

## 2022-12-05 DIAGNOSIS — M5030 Other cervical disc degeneration, unspecified cervical region: Secondary | ICD-10-CM

## 2022-12-08 ENCOUNTER — Other Ambulatory Visit: Payer: Self-pay | Admitting: Internal Medicine

## 2022-12-08 ENCOUNTER — Encounter: Payer: Self-pay | Admitting: Internal Medicine

## 2022-12-08 ENCOUNTER — Ambulatory Visit (INDEPENDENT_AMBULATORY_CARE_PROVIDER_SITE_OTHER): Payer: 59 | Admitting: Internal Medicine

## 2022-12-08 VITALS — BP 116/66 | HR 79 | Ht 68.0 in | Wt 149.8 lb

## 2022-12-08 DIAGNOSIS — E782 Mixed hyperlipidemia: Secondary | ICD-10-CM

## 2022-12-08 DIAGNOSIS — E1149 Type 2 diabetes mellitus with other diabetic neurological complication: Secondary | ICD-10-CM

## 2022-12-08 DIAGNOSIS — Z7984 Long term (current) use of oral hypoglycemic drugs: Secondary | ICD-10-CM | POA: Diagnosis not present

## 2022-12-08 DIAGNOSIS — E041 Nontoxic single thyroid nodule: Secondary | ICD-10-CM | POA: Diagnosis not present

## 2022-12-08 DIAGNOSIS — E119 Type 2 diabetes mellitus without complications: Secondary | ICD-10-CM | POA: Diagnosis not present

## 2022-12-08 DIAGNOSIS — Z0001 Encounter for general adult medical examination with abnormal findings: Secondary | ICD-10-CM | POA: Insufficient documentation

## 2022-12-08 DIAGNOSIS — I5032 Chronic diastolic (congestive) heart failure: Secondary | ICD-10-CM

## 2022-12-08 DIAGNOSIS — I10 Essential (primary) hypertension: Secondary | ICD-10-CM

## 2022-12-08 DIAGNOSIS — F411 Generalized anxiety disorder: Secondary | ICD-10-CM

## 2022-12-08 DIAGNOSIS — J9611 Chronic respiratory failure with hypoxia: Secondary | ICD-10-CM

## 2022-12-08 DIAGNOSIS — E559 Vitamin D deficiency, unspecified: Secondary | ICD-10-CM | POA: Diagnosis not present

## 2022-12-08 DIAGNOSIS — J439 Emphysema, unspecified: Secondary | ICD-10-CM

## 2022-12-08 DIAGNOSIS — I4891 Unspecified atrial fibrillation: Secondary | ICD-10-CM | POA: Diagnosis not present

## 2022-12-08 DIAGNOSIS — T466X5A Adverse effect of antihyperlipidemic and antiarteriosclerotic drugs, initial encounter: Secondary | ICD-10-CM

## 2022-12-08 DIAGNOSIS — G720 Drug-induced myopathy: Secondary | ICD-10-CM

## 2022-12-08 DIAGNOSIS — R42 Dizziness and giddiness: Secondary | ICD-10-CM

## 2022-12-08 NOTE — Assessment & Plan Note (Signed)
Well controlled with Stiolto and as needed albuterol Followed by Pulmonology Has 3 lpm home O2 for chronic hypoxia 

## 2022-12-08 NOTE — Progress Notes (Signed)
[facility]) [facility] Quality Pharmacy Team Statin Quality Measure Assessment  12/08/2022  Kayla Ryan 11/27/1955 [id number]  Per review of chart and payor information, patient has a diagnosis of diabetes and cardiovascular disease but is not currently filling a statin prescription.  This places patient into the Statin Use In Patients with Diabetes (SUPD) and Statin Use in Patients with Cardiovascular Disease (SPC) measures for CMS.    Patient has documented allergy to statin but no corresponding CPT codes that would exclude patient from SUPD and [facility] measure.     Component Value Date/Time   CHOL 189 06/03/2022 1054   TRIG 141 06/03/2022 1054   HDL 57 06/03/2022 1054   CHOLHDL 3.3 06/03/2022 1054   LDLCALC 107 (H) 06/03/2022 1054    Please consider ONE of the following recommendations:  Initiate high intensity statin Atorvastatin 40 mg once daily, #90, 3 refills   Rosuvastatin 20 mg once daily, #90, 3 refills    Initiate moderate intensity  statin with reduced frequency if prior  statin intolerance 1x weekly, #13, 3 refills   2x weekly, #26, 3 refills   3x weekly, #39, 3 refills    Code for past statin intolerance  (required annually)  Provider Requirements: Must associate code during an office visit or telehealth encounter   Drug Induced Myopathy G72.0   Myositis, unspecified M60.9   Myopathy, unspecified G72.9   Rhabdomyolysis  M62.82   Myalgia (SPC ONLY) M79.1   Alcoholic cirrhosis of liver without ascites K70.30   Alcoholic cirrhosis of liver with ascites K70.31   Unspecified cirrhosis of liver K74.60   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   Harlon Flor, PharmD Clinical Pharmacist  [facility] [phone]

## 2022-12-08 NOTE — Assessment & Plan Note (Signed)
Due to statin

## 2022-12-08 NOTE — Assessment & Plan Note (Signed)
Had acute CHF exacerbation in the past Currently euvolemic Was on Farxiga, but could not tolerate it - On Jardiance now Referred to Cardiology

## 2022-12-08 NOTE — Assessment & Plan Note (Signed)
Lab Results  Component Value Date   HGBA1C 5.2 09/01/2022   Well controlled now On Metformin and Mounjaro 5 mg qw Has had episodes of hypoglycemia in the past, has CGM now, no recent episode of hypoglycemia -  had decreased metformin from 500 mg twice daily to once daily as her hypoglycemia episodes were mostly in the early morning  On Jardiance for HFpEF (did not tolerate Farxiga 10 mg) Advised to follow diabetic diet Has statin intolerance F/u CMP and lipid panel Diabetic eye exam: Advised to follow up with Ophthalmology for diabetic eye exam  Takes gabapentin 800 mg QD, advised to take it twice daily as she has persistent numbness and tingling of bilateral LE

## 2022-12-08 NOTE — Assessment & Plan Note (Signed)
Physical exam as documented. Fasting blood tests today. Advised to get Shingrix and Tdap vaccines at local pharmacy. 

## 2022-12-08 NOTE — Assessment & Plan Note (Signed)
Recent US of thyroid showed 3.2 cm thyroid nodule TSH wnl Ordered US thyroid as she has noticed increase in size of the neck mass and has mild dysphagia now 

## 2022-12-08 NOTE — Assessment & Plan Note (Signed)
Due to COPD Followed by Pulmonology Has 3 lpm home O2 for chronic hypoxia 

## 2022-12-08 NOTE — Progress Notes (Signed)
Established Patient Office Visit  Subjective:  Patient ID: Kayla Ryan, female    DOB: 1955-12-07  Age: 67 y.o. MRN: [id number]  CC:  Chief Complaint  Patient presents with   Annual Exam    HPI Kayla Ryan is a 67 y.o. female with past medical history of HTN, HFpEF, COPD, GERD, type II DM with neuropathy, CKD, GAD and chronic pain syndrome who presents for annual physical.  Type II DM: She has been taking Mounjaro, and has been tolerating it well.  Her blood glucose has improved now, but had  episodes of hypoglycemia. She has a CGM - 100% within target range, no episode of hypoglycemia.  She is tolerating Jardiance now.  She has chronic numbness of bilateral foot.  She takes gabapentin 800 mg daily currently.  Denies any polyuria or polyphagia.  She has chronic fatigue.   GAD: She takes Ativan 0.5 mg 3 times daily as needed. She is also on Cymbalta.   She has history of COPD and chronic hypoxic respiratory failure.  She has Stiolto and as needed albuterol.  She uses 3 LPM O2 at home.   She also reports increasing size of the mass on the right side of the neck.  She has history of thyroid nodule, which was enlarged compared to prior US thyroid.  She has had mild difficulty swallowing as well.  Denies any recent change in weight or appetite, tremors or palpitations. She is scheduled to get repeat US thyroid on 05/08. Of note, her TSH and free T4 have been wnl.    Past Medical History:  Diagnosis Date   Allergy Unknown   Anemia    Anxiety 1883   Arthritis    Asthma    Chronic bronchitis    Crohn disease (HCC)    Emphysema    Fibromyalgia    GERD (gastroesophageal reflux disease)    GI bleed    Herpes    History of blood transfusion    Hyperlipidemia    Hypertension 2015   IBS (irritable bowel syndrome)    Kidney failure    Migraines    Muscular dystrophy (HCC)    Neck pain    PAF (paroxysmal atrial fibrillation) (HCC)    Not anticoagulated with history of severe GI  bleeding   Plantar fasciitis    Polymyositis (HCC)    PONV (postoperative nausea and vomiting)    Right thyroid nodule 04/07/2022   Scoliosis    Type 2 diabetes mellitus (HCC)     Past Surgical History:  Procedure Laterality Date   ABDOMINAL HYSTERECTOMY     AGILE CAPSULE N/A 06/24/2021   Procedure: AGILE CAPSULE;  Surgeon: Lanelle Bal, DO;  Location: [facility];  Service: Endoscopy;  Laterality: N/A;   AGILE CAPSULE N/A 07/22/2021   Procedure: AGILE CAPSULE;  Surgeon: Corbin Ade, MD;  Location: [facility];  Service: Endoscopy;  Laterality: N/A;  7:30am   bone spur     BREAST SURGERY  1995   CHOLECYSTECTOMY     COLONOSCOPY WITH PROPOFOL N/A 04/30/2021   Procedure: COLONOSCOPY WITH PROPOFOL;  Surgeon: Corbin Ade, MD;  Location: [facility];  Service: Endoscopy;  Laterality: N/A;   ESOPHAGEAL DILATION N/A 04/30/2021   Procedure: ESOPHAGEAL DILATION;  Surgeon: Corbin Ade, MD;  [facility]: [facility];  Service: Endoscopy;  Laterality: N/A;   ESOPHAGOGASTRODUODENOSCOPY (EGD) WITH PROPOFOL N/A 04/30/2021   Procedure: ESOPHAGOGASTRODUODENOSCOPY (EGD) WITH PROPOFOL;  Surgeon: Corbin Ade, MD;  Location: [facility];  Service: Endoscopy;  Laterality: N/A;   GIVENS CAPSULE STUDY N/A 09/22/2021   Procedure: GIVENS CAPSULE STUDY;  Surgeon: Corbin Ade, MD;  [facility]: [facility];  Service: Endoscopy;  Laterality: N/A;  7:30am   HERNIA REPAIR     LEFT HEART CATHETERIZATION WITH CORONARY ANGIOGRAM N/A 11/07/2013   Procedure: LEFT HEART CATHETERIZATION WITH CORONARY ANGIOGRAM;  Surgeon: Runell Gess, MD;  Location: [facility] CATH [facility];  Service: Cardiovascular;  Laterality: N/A;   MASTECTOMY PARTIAL / LUMPECTOMY     OOPHORECTOMY     ROTATOR CUFF REPAIR     TOOTH EXTRACTION N/A 04/15/2022   Procedure: DENTAL RESTORATION/EXTRACTIONS;  Surgeon: Ocie Doyne, DMD;  Location: [facility];  Service: Oral Surgery;  Laterality: N/A;    Family History  Problem Relation  Age of Onset   Lung cancer Mother    Cancer Mother    Miscarriages / [place] Mother    Varicose Veins Mother    COPD Father    Lung cancer Father    Lymphoma Father    Alcohol abuse Father    Arthritis Father    Anxiety disorder Sister    Depression Brother    Anxiety disorder Sister     Social History   Socioeconomic History   Marital status: Widowed    Spouse name: Not on file   Number of children: Not on file   Years of education: Not on file   Highest education level: Associate degree: occupational, Scientist, product/process development, or vocational program  Occupational History   Not on file  Tobacco Use   Smoking status: Former    Packs/day: 1.00    Years: 49.00    Additional pack years: 0.00    Total pack years: 49.00    Types: Cigarettes    Start date: 08/08/1966    Quit date: 08/31/2005    Years since quitting: 17.2   Smokeless tobacco: Never  Vaping Use   Vaping Use: Never used  Substance and Sexual Activity   Alcohol use: No   Drug use: No    Comment: used marijuana in teens   Sexual activity: Not Currently    Birth control/protection: Surgical  Other Topics Concern   Not on file  Social History Narrative   Not on file   Social Determinants of Health   Financial Resource Strain: Low Risk  (10/28/2022)   Overall Financial Resource Strain (CARDIA)    Difficulty of Paying Living Expenses: Not very hard  Food Insecurity: Food Insecurity Present (10/28/2022)   Hunger Vital Sign    Worried About Running Out of Food in the Last Year: Sometimes true    Ran Out of Food in the Last Year: Sometimes true  Transportation Needs: No Transportation Needs (10/28/2022)   PRAPARE - Administrator, Civil Service (Medical): No    Lack of Transportation (Non-Medical): No  Physical Activity: Insufficiently Active (10/28/2022)   Exercise Vital Sign    Days of Exercise per Week: 1 day    Minutes of Exercise per Session: 10 min  Stress: Stress Concern Present (10/28/2022)   [employer] - Occupational Stress Questionnaire    Feeling of Stress : To some extent  Social Connections: Unknown (10/28/2022)   Social Connection and Isolation Panel [NHANES]    Frequency of Communication with Friends and Family: Twice a week    Frequency of Social Gatherings with Friends and Family: Never    Attends Religious Services: Patient declined  Active Member of Clubs or Organizations: Patient declined    Attends Banker Meetings: Never    Marital Status: Widowed  Intimate Partner Violence: Not At Risk (03/14/2022)   Humiliation, Afraid, Rape, and Kick questionnaire    Fear of Current or Ex-Partner: No    Emotionally Abused: No    Physically Abused: No    Sexually Abused: No    Outpatient Medications Prior to Visit  Medication Sig Dispense Refill   ACCU-CHEK GUIDE test strip USE TO CHECK BLOOD GLUCOSE THREE TIMES DAILY, MORNING, AT NOON, AND AT BEDTIME 100 strip 0   Accu-Chek Softclix Lancets lancets USE TO TEST BLOOD SUGAR EVERY MORNING, AT NOON AND EVERY NIGHT AT BEDTIME 100 each 0   acyclovir (ZOVIRAX) 400 MG tablet Take 400 mg by mouth every evening.     cetirizine (ZYRTEC) 10 MG tablet Take 10 mg by mouth 2 (two) times daily.     Continuous Blood Gluc Sensor (DEXCOM G7 SENSOR) MISC USE TO CHECK BLOOD SUGAR THREE TIMES DAILY BEFORE MEALS AND AT BEDTIME AND AS NEEDED. CHANGE SENSOR EVERY 10 DAYS 3 each 5   dicyclomine (BENTYL) 10 MG capsule Take 1 capsule (10 mg total) by mouth 4 (four) times daily -  before meals and at bedtime. Monitor for constipation, dry mouth, dizziness (Patient taking differently: Take 10 mg by mouth 4 (four) times daily as needed (Bowel Spasm/Diarrhea). Monitor for constipation, dry mouth, dizziness) 120 capsule 3   diphenhydrAMINE (BENADRYL) 25 MG tablet Take 25 mg by mouth every 4 (four) hours as needed for allergies or itching.     doxepin (SINEQUAN) 10 MG capsule TAKE 1 CAPSULE(10 MG) BY MOUTH AT BEDTIME 30 capsule  3   DULoxetine (CYMBALTA) 60 MG capsule Take 1 capsule (60 mg total) by mouth 2 (two) times daily. (Patient taking differently: Take 60 mg by mouth every evening.) 60 capsule 3   empagliflozin (JARDIANCE) 10 MG TABS tablet Take 1 tablet (10 mg total) by mouth daily before breakfast. 30 tablet 5   gabapentin (NEURONTIN) 800 MG tablet Take 1 tablet (800 mg total) by mouth 3 (three) times daily. 90 tablet 0   gemfibrozil (LOPID) 600 MG tablet Take 1 tablet (600 mg total) by mouth 2 (two) times daily before a meal. 180 tablet 1   Glucagon (GVOKE HYPOPEN 2-PACK) 1 MG/0.2ML SOAJ Inject 0.2 mLs into the skin as needed (Blood glucose < 53). 0.4 mL 5   Glycerin-Hypromellose-PEG 400 (DRY EYE RELIEF DROPS OP) Place 1 drop into both eyes 3 (three) times daily as needed (Dry eye).     HYDROcodone-acetaminophen (NORCO) 10-325 MG tablet Take 1 tablet by mouth every 4 (four) hours as needed. Stop Hydrocodone 7.5/325mg  tablet. 180 tablet 0   ipratropium (ATROVENT HFA) 17 MCG/ACT inhaler Inhale 2 puffs into the lungs every 4 (four) hours as needed (COPD).     isosorbide dinitrate (ISORDIL) 30 MG tablet TAKE 1 TABLET(30 MG) BY MOUTH DAILY 90 tablet 1   Lancets Misc. (ACCU-CHEK SOFTCLIX LANCET DEV) KIT USE TO CHECK GLUCOSE THREE TIMES DAILY 1 kit 0   LORazepam (ATIVAN) 0.5 MG tablet TAKE 1 TABLET(0.5 MG) BY MOUTH THREE TIMES DAILY AS NEEDED FOR ANXIETY 90 tablet 3   meclizine (ANTIVERT) 25 MG tablet TAKE 1 TABLET(25 MG) BY MOUTH THREE TIMES DAILY AS NEEDED FOR DIZZINESS 30 tablet 1   metFORMIN (GLUCOPHAGE) 500 MG tablet TAKE 1 TABLET(500 MG) BY MOUTH TWICE DAILY WITH A MEAL 60 tablet 2   methocarbamol (ROBAXIN) 500  MG tablet TAKE 1 TABLET(500 MG) BY MOUTH EVERY 8 HOURS AS NEEDED FOR MUSCLE SPASMS 30 tablet 1   Misc. Devices KIT Henmnii rollator walker 1 kit 0   montelukast (SINGULAIR) 10 MG tablet Take 10 mg by mouth at bedtime.     MOUNJARO 5 MG/0.5ML Pen INJECT 5 MG UNDER THE SKIN ONCE A WEEK 6 mL 1   NITROSTAT 0.4  MG SL tablet Place 0.4 mg under the tongue every 15 (fifteen) minutes as needed for chest pain.      omeprazole (PRILOSEC) 40 MG capsule Take 40 mg by mouth 2 (two) times daily.     ondansetron (ZOFRAN-ODT) 8 MG disintegrating tablet DISSOLVE 1 TABLET(8 MG) ON THE TONGUE EVERY 8 HOURS AS NEEDED FOR NAUSEA OR VOMITING 20 tablet 0   simethicone (GAS-X) 80 MG chewable tablet Chew 1 tablet (80 mg total) by mouth every 6 (six) hours as needed for flatulence. 30 tablet 0   Tiotropium Bromide-Olodaterol (STIOLTO RESPIMAT) 2.5-2.5 MCG/ACT AERS Inhale 2 each into the lungs daily. 4 g 11   Continuous Blood Gluc Receiver (DEXCOM G7 RECEIVER) DEVI To check blood glucose PRN 1 each 0   Cholecalciferol (VITAMIN D3) 50 MCG (2000 UT) capsule Take 2,000 Units by mouth daily.     hydrocortisone (ANUSOL-HC) 2.5 % rectal cream Place 1 Application rectally 2 (two) times daily. (Patient taking differently: Place 1 Application rectally 2 (two) times daily as needed for hemorrhoids or anal itching.) 30 g 1   vitamin B-12 (CYANOCOBALAMIN) 100 MCG tablet Take 100 mcg by mouth daily.     No facility-administered medications prior to visit.    Allergies  Allergen Reactions   Sulfa Antibiotics Shortness Of Breath, Swelling and Rash   Clindamycin/Lincomycin    Dilaudid [Hydromorphone Hcl]     Makes me crazy   Iron     Throat starts closing up, tongue swells, severe headaches and backpain   Metronidazole Diarrhea and Nausea And Vomiting   Prednisone Other (See Comments)    Angry     Statins Other (See Comments)    Unknown    Cephalexin Diarrhea and Nausea And Vomiting   Methotrexate Derivatives Swelling and Rash   Morphine And Related Anxiety    "exreme mood swings"    ROS Review of Systems  Constitutional:  Positive for fatigue. Negative for chills and fever.  HENT:  Negative for congestion, sinus pressure, sinus pain and sore throat.   Eyes:  Negative for pain and discharge.  Respiratory:  Positive for  shortness of breath. Negative for cough.   Cardiovascular:  Negative for chest pain and palpitations.  Gastrointestinal:  Positive for constipation. Negative for abdominal pain, nausea and vomiting.  Endocrine: Negative for polydipsia and polyuria.  Genitourinary:  Negative for dysuria and hematuria.  Musculoskeletal:  Positive for arthralgias, back pain, gait problem and neck pain. Negative for neck stiffness.  Skin:  Negative for rash.  Neurological:  Positive for dizziness. Negative for weakness.  Psychiatric/Behavioral:  Positive for sleep disturbance. Negative for agitation and behavioral problems. The patient is nervous/anxious.       Objective:    Physical Exam Vitals reviewed.  Constitutional:      General: She is not in acute distress.    Appearance: She is not diaphoretic.  HENT:     Head: Normocephalic and atraumatic.     Nose: No congestion.     Mouth/Throat:     Mouth: Mucous membranes are moist.  Eyes:     General: No  scleral icterus.    Extraocular Movements: Extraocular movements intact.  Neck:     Comments: Right-sided neck mass - thyroid nodule, nontender, about 4 cm in diameter Cardiovascular:     Rate and Rhythm: Normal rate and regular rhythm.     Pulses: Normal pulses.     Heart sounds: Normal heart sounds. No murmur heard. Pulmonary:     Breath sounds: Normal breath sounds. No wheezing or rales.     Comments: On 3 LPM O2 Abdominal:     Palpations: Abdomen is soft.     Tenderness: There is no abdominal tenderness.  Musculoskeletal:     Cervical back: Neck supple. No tenderness.     Right lower leg: No edema.     Left lower leg: No edema.  Skin:    General: Skin is warm.     Findings: No rash.  Neurological:     General: No focal deficit present.     Mental Status: She is alert and oriented to person, place, and time.     Sensory: Sensory deficit present.     Motor: Weakness (4/5 in b/l UE and LE) present.  Psychiatric:        Mood and Affect:  Mood normal.        Behavior: Behavior normal.     BP 116/66   Pulse 79   Ht 5\' 8"  (1.727 m)   Wt 149 lb 12.8 oz (67.9 kg)   SpO2 98%   BMI 22.78 kg/m  Wt Readings from Last 3 Encounters:  12/08/22 149 lb 12.8 oz (67.9 kg)  11/01/22 146 lb 3.2 oz (66.3 kg)  09/26/22 143 lb (64.9 kg)    Lab Results  Component Value Date   TSH 3.800 09/01/2022   Lab Results  Component Value Date   WBC 6.2 09/21/2022   HGB 11.4 (L) 09/21/2022   HCT 36.5 09/21/2022   MCV 92.9 09/21/2022   PLT 357 09/21/2022   Lab Results  Component Value Date   NA 139 09/01/2022   K 4.6 09/01/2022   CO2 25 09/01/2022   GLUCOSE 82 09/01/2022   BUN 22 09/01/2022   CREATININE 1.13 (H) 09/01/2022   BILITOT 0.2 09/01/2022   ALKPHOS 59 09/01/2022   AST 33 09/01/2022   ALT 35 (H) 09/01/2022   PROT 7.9 09/01/2022   ALBUMIN 5.1 (H) 09/01/2022   CALCIUM 10.0 09/01/2022   ANIONGAP 13 04/15/2022   EGFR 53 (L) 09/01/2022   GFR 58.40 (L) 01/28/2021   Lab Results  Component Value Date   CHOL 189 06/03/2022   Lab Results  Component Value Date   HDL 57 06/03/2022   Lab Results  Component Value Date   LDLCALC 107 (H) 06/03/2022   Lab Results  Component Value Date   TRIG 141 06/03/2022   Lab Results  Component Value Date   CHOLHDL 3.3 06/03/2022   Lab Results  Component Value Date   HGBA1C 5.2 09/01/2022      Assessment & Plan:   Problem List Items Addressed This Visit       Cardiovascular and Mediastinum   Essential hypertension    BP Readings from Last 1 Encounters:  12/08/22 116/66  Well-controlled with Imdur (for CAD/HFpEF) Counseled for compliance with the medications Advised DASH diet and moderate exercise/walking as tolerated      Relevant Orders   CMP14+EGFR   CBC with Differential/Platelet   (HFpEF) heart failure with preserved ejection fraction (HCC)    Had acute CHF exacerbation in  the past Currently euvolemic Was on Farxiga, but could not tolerate it - On Jardiance  now Referred to Cardiology        Respiratory   Chronic obstructive pulmonary disease/Emphysema    Well controlled with Stiolto and as needed albuterol Followed by Pulmonology Has 3 lpm home O2 for chronic hypoxia      Chronic respiratory failure with hypoxia (HCC)    Due to COPD Followed by Pulmonology Has 3 lpm home O2 for chronic hypoxia        Endocrine   Type 2 diabetes mellitus with neurological complications (HCC)    Lab Results  Component Value Date   HGBA1C 5.2 09/01/2022  Well controlled now On Metformin and Mounjaro 5 mg qw Has had episodes of hypoglycemia in the past, has CGM now, no recent episode of hypoglycemia -  had decreased metformin from 500 mg twice daily to once daily as her hypoglycemia episodes were mostly in the early morning  On Jardiance for HFpEF (did not tolerate Farxiga 10 mg) Advised to follow diabetic diet Has statin intolerance F/u CMP and lipid panel Diabetic eye exam: Advised to follow up with Ophthalmology for diabetic eye exam  Takes gabapentin 800 mg QD, advised to take it twice daily as she has persistent numbness and tingling of bilateral LE      Relevant Orders   Hemoglobin A1c   CMP14+EGFR   Ambulatory referral to Optometry   Right thyroid nodule    Recent US of thyroid showed 3.2 cm thyroid nodule TSH wnl Ordered US thyroid as she has noticed increase in size of the neck mass and has mild dysphagia now        Musculoskeletal and Integument   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 Fasting blood tests today. Advised to get Shingrix and Tdap vaccines at local pharmacy.      Other Visit Diagnoses     Vitamin D deficiency       Relevant Orders   VITAMIN D 25 Hydroxy (Vit-D Deficiency, Fractures)   Diabetic eye exam (HCC)       Relevant Orders   Ambulatory referral to Optometry       No orders of the defined types were placed in this encounter.   Follow-up: Return in about 4 months (around 04/10/2023) for DM and HTN.    Anabel Halon, MD

## 2022-12-08 NOTE — Assessment & Plan Note (Signed)
Takes Ativan 0.5 mg TID PRN On Cymbalta Refilled, PDMP reviewed 

## 2022-12-08 NOTE — Assessment & Plan Note (Signed)
Her dizziness and nausea likely due to vertigo Meclizine PRN for now Advised to avoid sudden positional changes 

## 2022-12-08 NOTE — Assessment & Plan Note (Signed)
BP Readings from Last 1 Encounters:  12/08/22 116/66   Well-controlled with Imdur (for CAD/HFpEF) Counseled for compliance with the medications Advised DASH diet and moderate exercise/walking as tolerated

## 2022-12-08 NOTE — Patient Instructions (Addendum)
Please continue to take medications as prescribed.  Please continue to follow low carb diet and perform moderate exercise/walking as tolerated.  Please consider getting Shingrix vaccine at local pharmacy.  [facility] -  [phone].

## 2022-12-08 NOTE — Assessment & Plan Note (Signed)
Advised to follow low carb diet Has statin induced myopathy

## 2022-12-09 ENCOUNTER — Telehealth: Payer: Self-pay | Admitting: Internal Medicine

## 2022-12-09 ENCOUNTER — Inpatient Hospital Stay: Payer: 59

## 2022-12-09 LAB — CMP14+EGFR
ALT: 31 IU/L (ref 0–32)
AST: 26 IU/L (ref 0–40)
Albumin: 4.8 g/dL (ref 3.9–4.9)
Bilirubin Total: 0.2 mg/dL (ref 0.0–1.2)
Globulin, Total: 2.8 g/dL (ref 1.5–4.5)

## 2022-12-09 LAB — CBC WITH DIFFERENTIAL/PLATELET
Basophils Absolute: 0.1 10*3/uL (ref 0.0–0.2)
Immature Granulocytes: 0 %
Lymphs: 31 %
Neutrophils: 51 %
Platelets: 474 10*3/uL — ABNORMAL HIGH (ref 150–450)

## 2022-12-09 LAB — LIPID PANEL
Cholesterol, Total: 200 mg/dL — ABNORMAL HIGH (ref 100–199)
HDL: 46 mg/dL (ref >39)
VLDL Cholesterol Cal: 38 mg/dL (ref 5–40)

## 2022-12-09 LAB — HEMOGLOBIN A1C: Est. average glucose Bld gHb Est-mCnc: 105 mg/dL

## 2022-12-09 NOTE — Telephone Encounter (Signed)
Spoke to patient

## 2022-12-09 NOTE — Telephone Encounter (Signed)
Patient called can not find MOUNJARO 5 MG/0.5ML Pen  at any pharmacy. All pharmacy has is 10 mg. What does patient need to do?  Only has one shot for tomorrow left.   AND   Patient has lost receiver to her dexcom 7 , she called the insurance company will approve to send in a authorization. Contact patient if she needs to call her pharmacy.  This needs to be changed today.

## 2022-12-10 LAB — CBC WITH DIFFERENTIAL/PLATELET
Basos: 1 %
EOS (ABSOLUTE): 0.6 10*3/uL — ABNORMAL HIGH (ref 0.0–0.4)
Eos: 9 %
Hematocrit: 37.7 % (ref 34.0–46.6)
Hemoglobin: 12.2 g/dL (ref 11.1–15.9)
Immature Grans (Abs): 0 10*3/uL (ref 0.0–0.1)
Lymphocytes Absolute: 2.1 10*3/uL (ref 0.7–3.1)
MCH: 29.5 pg (ref 26.6–33.0)
MCHC: 32.4 g/dL (ref 31.5–35.7)
MCV: 91 fL (ref 79–97)
Monocytes Absolute: 0.5 10*3/uL (ref 0.1–0.9)
Monocytes: 8 %
Neutrophils Absolute: 3.4 10*3/uL (ref 1.4–7.0)
RBC: 4.13 x10E6/uL (ref 3.77–5.28)
RDW: 12.2 % (ref 11.7–15.4)
WBC: 6.6 10*3/uL (ref 3.4–10.8)

## 2022-12-10 LAB — HEMOGLOBIN A1C: Hgb A1c MFr Bld: 5.3 % (ref 4.8–5.6)

## 2022-12-10 LAB — LIPID PANEL
Chol/HDL Ratio: 4.3 ratio (ref 0.0–4.4)
LDL Chol Calc (NIH): 116 mg/dL — ABNORMAL HIGH (ref 0–99)
Triglycerides: 216 mg/dL — ABNORMAL HIGH (ref 0–149)

## 2022-12-10 LAB — CMP14+EGFR
Albumin/Globulin Ratio: 1.7 (ref 1.2–2.2)
Alkaline Phosphatase: 73 IU/L (ref 44–121)
BUN/Creatinine Ratio: 39 — ABNORMAL HIGH (ref 12–28)
BUN: 39 mg/dL — ABNORMAL HIGH (ref 8–27)
CO2: 23 mmol/L (ref 20–29)
Calcium: 10 mg/dL (ref 8.7–10.3)
Chloride: 97 mmol/L (ref 96–106)
Creatinine, Ser: 1 mg/dL (ref 0.57–1.00)
Glucose: 85 mg/dL (ref 70–99)
Potassium: 4.7 mmol/L (ref 3.5–5.2)
Sodium: 140 mmol/L (ref 134–144)
Total Protein: 7.6 g/dL (ref 6.0–8.5)
eGFR: 61 mL/min/{1.73_m2} (ref >59)

## 2022-12-10 LAB — VITAMIN D 25 HYDROXY (VIT D DEFICIENCY, FRACTURES): Vit D, 25-Hydroxy: 38.8 ng/mL (ref 30.0–100.0)

## 2022-12-12 ENCOUNTER — Telehealth: Payer: Self-pay | Admitting: Internal Medicine

## 2022-12-12 NOTE — Telephone Encounter (Signed)
Pt called in wants a callback . Needs to know the name ofd dentis pcp recommended

## 2022-12-12 NOTE — Telephone Encounter (Signed)
Spoke to patient

## 2022-12-13 ENCOUNTER — Ambulatory Visit (HOSPITAL_COMMUNITY): Payer: 59

## 2022-12-13 DIAGNOSIS — M797 Fibromyalgia: Secondary | ICD-10-CM | POA: Diagnosis not present

## 2022-12-13 DIAGNOSIS — G894 Chronic pain syndrome: Secondary | ICD-10-CM | POA: Diagnosis not present

## 2022-12-13 DIAGNOSIS — Z79891 Long term (current) use of opiate analgesic: Secondary | ICD-10-CM | POA: Diagnosis not present

## 2022-12-13 DIAGNOSIS — M47816 Spondylosis without myelopathy or radiculopathy, lumbar region: Secondary | ICD-10-CM | POA: Diagnosis not present

## 2022-12-14 ENCOUNTER — Ambulatory Visit (HOSPITAL_COMMUNITY)
Admission: RE | Admit: 2022-12-14 | Discharge: 2022-12-14 | Disposition: A | Discharge status: Home / Self Care | Payer: 59 | Source: Ambulatory Visit | Attending: Internal Medicine | Admitting: Internal Medicine

## 2022-12-14 DIAGNOSIS — E041 Nontoxic single thyroid nodule: Secondary | ICD-10-CM | POA: Insufficient documentation

## 2022-12-14 DIAGNOSIS — E042 Nontoxic multinodular goiter: Secondary | ICD-10-CM | POA: Diagnosis not present

## 2022-12-15 ENCOUNTER — Other Ambulatory Visit: Payer: Self-pay | Admitting: Internal Medicine

## 2022-12-15 ENCOUNTER — Other Ambulatory Visit: Payer: Self-pay | Admitting: Hematology

## 2022-12-15 ENCOUNTER — Telehealth: Payer: Self-pay | Admitting: Internal Medicine

## 2022-12-15 DIAGNOSIS — E1149 Type 2 diabetes mellitus with other diabetic neurological complication: Secondary | ICD-10-CM

## 2022-12-15 DIAGNOSIS — M5030 Other cervical disc degeneration, unspecified cervical region: Secondary | ICD-10-CM

## 2022-12-15 DIAGNOSIS — R42 Dizziness and giddiness: Secondary | ICD-10-CM

## 2022-12-15 MED ORDER — TIRZEPATIDE 2.5 MG/0.5ML ~~LOC~~ SOAJ
2.5000 mg | SUBCUTANEOUS | 3 refills | Status: DC
Start: 2022-12-15 — End: 2023-03-20

## 2022-12-15 NOTE — Telephone Encounter (Signed)
Pt called and stated she has been having a difficult time getting medication. She asked could 2.5 Mounjaro be sent the pharmacy instead of MOUNJARO 5 MG/0.5ML Pen [[id number]] if so she would like it sent to [facility] on [address]

## 2022-12-16 ENCOUNTER — Inpatient Hospital Stay: Payer: 59

## 2022-12-21 ENCOUNTER — Ambulatory Visit: Payer: 59 | Admitting: Emergency Medicine

## 2022-12-21 DIAGNOSIS — J449 Chronic obstructive pulmonary disease, unspecified: Secondary | ICD-10-CM | POA: Diagnosis not present

## 2022-12-21 DIAGNOSIS — J9611 Chronic respiratory failure with hypoxia: Secondary | ICD-10-CM | POA: Diagnosis not present

## 2022-12-23 ENCOUNTER — Inpatient Hospital Stay: Payer: 59

## 2022-12-30 ENCOUNTER — Inpatient Hospital Stay: Payer: 59

## 2023-01-06 ENCOUNTER — Inpatient Hospital Stay: Payer: 59

## 2023-01-09 ENCOUNTER — Ambulatory Visit: Payer: 59 | Admitting: Urology

## 2023-01-12 ENCOUNTER — Telehealth: Payer: Self-pay | Admitting: Internal Medicine

## 2023-01-12 NOTE — Telephone Encounter (Signed)
Pt called and stated the Eye dr she was referred to is not accepting new patients. She needs another referral please.

## 2023-01-13 ENCOUNTER — Other Ambulatory Visit: Payer: Self-pay | Admitting: Internal Medicine

## 2023-01-13 DIAGNOSIS — M5030 Other cervical disc degeneration, unspecified cervical region: Secondary | ICD-10-CM

## 2023-01-13 DIAGNOSIS — R42 Dizziness and giddiness: Secondary | ICD-10-CM

## 2023-01-16 ENCOUNTER — Other Ambulatory Visit: Payer: Self-pay | Admitting: Internal Medicine

## 2023-01-16 DIAGNOSIS — M5030 Other cervical disc degeneration, unspecified cervical region: Secondary | ICD-10-CM

## 2023-01-20 ENCOUNTER — Encounter: Payer: Self-pay | Admitting: Emergency Medicine

## 2023-01-20 ENCOUNTER — Ambulatory Visit (INDEPENDENT_AMBULATORY_CARE_PROVIDER_SITE_OTHER): Payer: 59 | Admitting: Emergency Medicine

## 2023-01-20 VITALS — BP 118/64 | HR 82 | Ht 68.0 in | Wt 159.4 lb

## 2023-01-20 DIAGNOSIS — J9611 Chronic respiratory failure with hypoxia: Secondary | ICD-10-CM

## 2023-01-20 DIAGNOSIS — J309 Allergic rhinitis, unspecified: Secondary | ICD-10-CM | POA: Diagnosis not present

## 2023-01-20 DIAGNOSIS — J439 Emphysema, unspecified: Secondary | ICD-10-CM | POA: Diagnosis not present

## 2023-01-20 MED ORDER — PREDNISONE 10 MG PO TABS: ORAL_TABLET | ORAL | 0 refills | Status: DC

## 2023-01-20 MED ORDER — IPRATROPIUM BROMIDE HFA 17 MCG/ACT IN AERS: 2.0000 | INHALATION_SPRAY | RESPIRATORY_TRACT | 4 refills | Status: DC | PRN

## 2023-01-20 MED ORDER — DOXYCYCLINE HYCLATE 100 MG PO TABS: 100.0000 mg | ORAL_TABLET | Freq: Two times a day (BID) | ORAL | 0 refills | Status: DC

## 2023-01-20 MED ORDER — METHYLPREDNISOLONE ACETATE 80 MG/ML IJ SUSP
80.0000 mg | Freq: Once | INTRAMUSCULAR | Status: AC
Start: 2023-01-20 — End: 2023-01-20
  Administered 2023-01-20: 80 mg via INTRAMUSCULAR

## 2023-01-20 NOTE — Assessment & Plan Note (Signed)
With acute flare in the setting of progressive allergy symptoms.  She is taking her allergy regimen as directed.  Wheezing, increased cough, purulent sputum.  Plan to treat for likely flare.  She is flaring about once annually (a lot less than she used to).  If her flare frequency increases then would favor adding ICS to her regimen.  We will give you a Depo-Medrol shot today. Please take prednisone until completely gone as directed Please take doxycycline 100 mg twice a day for 7 days Continue your Stiolto 2 puffs once daily. Keep albuterol available to use 2 puffs if needed for shortness of breath, chest tightness, wheezing.  We will refill this for you today. Follow with Dr Delton Coombes in 6 months or sooner if you have any problems

## 2023-01-20 NOTE — Addendum Note (Signed)
Addended by: Maurene Capes on: 01/20/2023 12:07 PM   Modules accepted: Orders

## 2023-01-20 NOTE — Progress Notes (Signed)
Subjective:    Patient ID: Kayla Ryan, female    DOB: [birth year], 67 y.o.   MRN: [id number]  HPI  ROV 09/15/21 --follow-up visit for 67 year old woman with history tobacco (25 pack years).  I have followed her for COPD with asthmatic features, chronic rhinitis, GERD, cough.  Past medical history also significant for inflammatory myopathy, fibromyalgia, hyperlipidemia, Crohn's disease with IBS and chronic anemia.  She has severe obstruction on pulmonary function testing.  Our last visit was 05/11/2021 by telephone.  We have been managing her on Stiolto, albuterol as needed, oxygen at 3 L/min.  Also Singulair, Zyrtec, Mucinex prn.  We have plan to repeat a CT chest to confirm resolution of bibasilar infiltrates that were found 04/2021 when she was treated for a pneumonia and anemia.  I do not see that this test has been done. Today she reports that she is SOB with any exertion, using 3L/min. She has albuterol HFA, and nebs. Uses about 3x a week. She is going to have a camera endoscopy 2/15 to evaluate anemia. She is interested in a pulsed O2 system, POC.   ROV 01/20/2023 --67 year old woman with a history of tobacco use and COPD, currently smoking approximately.  She has severe obstruction and a positive bronchodilator response and asthmatic features.  Also chronic rhinitis, GERD and cough.  PMH: Crohn's disease with IBS, chronic anemia, fibromyalgia with an inflammatory myopathy.  Currently managed on Stiolto.  Uses albuterol rarely. On singulair, zyrtec, allegra, nasal saline rinses Today she reports that she had been well until about 2 weeks ago - she has developed increased cough, bringing up some green mucous. She has started to hear wheeze. Has been more dyspneic. Her last flare was a year ago. She is on O2 at 2-3 L/min.   Review of Systems As per HPI  Past Medical History:  Diagnosis Date   Allergy Unknown   Anemia    Anxiety 1883   Arthritis    Asthma    Chronic bronchitis    Crohn  disease (HCC)    Emphysema    Fibromyalgia    GERD (gastroesophageal reflux disease)    GI bleed    Herpes    History of blood transfusion    Hyperlipidemia    Hypertension 2015   IBS (irritable bowel syndrome)    Kidney failure    Migraines    Muscular dystrophy (HCC)    Neck pain    PAF (paroxysmal atrial fibrillation) (HCC)    Not anticoagulated with history of severe GI bleeding   Plantar fasciitis    Polymyositis (HCC)    PONV (postoperative nausea and vomiting)    Right thyroid nodule 04/07/2022   Scoliosis    Type 2 diabetes mellitus (HCC)      Family History  Problem Relation Age of Onset   Lung cancer Mother    Cancer Mother    Miscarriages / [place] Mother    Varicose Veins Mother    COPD Father    Lung cancer Father    Lymphoma Father    Alcohol abuse Father    Arthritis Father    Anxiety disorder Sister    Depression Brother    Anxiety disorder Sister      Social History   Socioeconomic History   Marital status: Widowed    Spouse name: Not on file   Number of children: Not on file   Years of education: Not on file   Highest education level: Associate degree: occupational, Scientist, product/process development,  or vocational program  Occupational History   Not on file  Tobacco Use   Smoking status: Former    Packs/day: 1.00    Years: 49.00    Additional pack years: 0.00    Total pack years: 49.00    Types: Cigarettes    Start date: 08/08/1966    Quit date: 08/31/2005    Years since quitting: 17.4   Smokeless tobacco: Never  Vaping Use   Vaping Use: Never used  Substance and Sexual Activity   Alcohol use: No   Drug use: No    Comment: used marijuana in teens   Sexual activity: Not Currently    Birth control/protection: Surgical  Other Topics Concern   Not on file  Social History Narrative   Not on file   Social Determinants of Health   Financial Resource Strain: Low Risk  (10/28/2022)   Overall Financial Resource Strain (CARDIA)    Difficulty of Paying  Living Expenses: Not very hard  Food Insecurity: Food Insecurity Present (10/28/2022)   Hunger Vital Sign    Worried About Running Out of Food in the Last Year: Sometimes true    Ran Out of Food in the Last Year: Sometimes true  Transportation Needs: No Transportation Needs (10/28/2022)   PRAPARE - Administrator, Civil Service (Medical): No    Lack of Transportation (Non-Medical): No  Physical Activity: Insufficiently Active (10/28/2022)   Exercise Vital Sign    Days of Exercise per Week: 1 day    Minutes of Exercise per Session: 10 min  Stress: Stress Concern Present (10/28/2022)   [employer] of Occupational Health - Occupational Stress Questionnaire    Feeling of Stress : To some extent  Social Connections: Unknown (10/28/2022)   Social Connection and Isolation Panel [NHANES]    Frequency of Communication with Friends and Family: Twice a week    Frequency of Social Gatherings with Friends and Family: Never    Attends Religious Services: Patient declined    Database administrator or Organizations: Patient declined    Attends Banker Meetings: Never    Marital Status: Widowed  Intimate Partner Violence: Not At Risk (03/14/2022)   Humiliation, Afraid, Rape, and Kick questionnaire    Fear of Current or Ex-Partner: No    Emotionally Abused: No    Physically Abused: No    Sexually Abused: No    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  From [place], lived in [place], [place], [place].   Allergies  Allergen Reactions   Sulfa Antibiotics Shortness Of Breath, Swelling and Rash   Clindamycin/Lincomycin    Dilaudid [Hydromorphone Hcl]     Makes me crazy   Iron     Throat starts closing up, tongue swells, severe headaches and backpain   Metronidazole Diarrhea and Nausea And Vomiting   Prednisone Other (See Comments)    Angry     Statins Other (See Comments)    Unknown    Cephalexin Diarrhea and Nausea And Vomiting   Methotrexate Derivatives Swelling and  Rash   Morphine And Codeine Anxiety    "exreme mood swings"     Outpatient Medications Prior to Visit  Medication Sig Dispense Refill   ACCU-CHEK GUIDE test strip USE TO CHECK BLOOD GLUCOSE THREE TIMES DAILY, MORNING, AT NOON, AND AT BEDTIME 100 strip 0   Accu-Chek Softclix Lancets lancets USE TO TEST BLOOD SUGAR EVERY MORNING, AT NOON AND EVERY NIGHT AT BEDTIME 100 each 0   acyclovir (  ZOVIRAX) 400 MG tablet Take 400 mg by mouth every evening.     cetirizine (ZYRTEC) 10 MG tablet Take 10 mg by mouth 2 (two) times daily.     Continuous Blood Gluc Sensor (DEXCOM G7 SENSOR) MISC USE TO CHECK BLOOD SUGAR THREE TIMES DAILY BEFORE MEALS AND AT BEDTIME AND AS NEEDED. CHANGE SENSOR EVERY 10 DAYS 3 each 5   Continuous Glucose Receiver (DEXCOM G7 RECEIVER) DEVI USE TO CHECK BLOOD GLUCOSE AS NEEDED 1 each 0   dicyclomine (BENTYL) 10 MG capsule Take 1 capsule (10 mg total) by mouth 4 (four) times daily -  before meals and at bedtime. Monitor for constipation, dry mouth, dizziness (Patient taking differently: Take 10 mg by mouth 4 (four) times daily as needed (Bowel Spasm/Diarrhea). Monitor for constipation, dry mouth, dizziness) 120 capsule 3   diphenhydrAMINE (BENADRYL) 25 MG tablet Take 25 mg by mouth every 4 (four) hours as needed for allergies or itching.     doxepin (SINEQUAN) 10 MG capsule TAKE 1 CAPSULE(10 MG) BY MOUTH AT BEDTIME 30 capsule 3   DULoxetine (CYMBALTA) 60 MG capsule Take 1 capsule (60 mg total) by mouth 2 (two) times daily. (Patient taking differently: Take 60 mg by mouth every evening.) 60 capsule 3   empagliflozin (JARDIANCE) 10 MG TABS tablet Take 1 tablet (10 mg total) by mouth daily before breakfast. 30 tablet 5   gabapentin (NEURONTIN) 800 MG tablet Take 1 tablet (800 mg total) by mouth 3 (three) times daily. 90 tablet 0   gemfibrozil (LOPID) 600 MG tablet Take 1 tablet (600 mg total) by mouth 2 (two) times daily before a meal. 180 tablet 1   Glucagon (GVOKE HYPOPEN 2-PACK) 1  MG/0.2ML SOAJ Inject 0.2 mLs into the skin as needed (Blood glucose < 53). 0.4 mL 5   Glycerin-Hypromellose-PEG 400 (DRY EYE RELIEF DROPS OP) Place 1 drop into both eyes 3 (three) times daily as needed (Dry eye).     HYDROcodone-acetaminophen (NORCO) 10-325 MG tablet Take 1 tablet by mouth every 4 (four) hours as needed. Stop Hydrocodone 7.5/325mg  tablet. 180 tablet 0   ipratropium (ATROVENT HFA) 17 MCG/ACT inhaler Inhale 2 puffs into the lungs every 4 (four) hours as needed (COPD).     isosorbide dinitrate (ISORDIL) 30 MG tablet TAKE 1 TABLET(30 MG) BY MOUTH DAILY 90 tablet 1   Lancets Misc. (ACCU-CHEK SOFTCLIX LANCET DEV) KIT USE TO CHECK GLUCOSE THREE TIMES DAILY 1 kit 0   LORazepam (ATIVAN) 0.5 MG tablet TAKE 1 TABLET(0.5 MG) BY MOUTH THREE TIMES DAILY AS NEEDED FOR ANXIETY 90 tablet 3   meclizine (ANTIVERT) 25 MG tablet TAKE 1 TABLET(25 MG) BY MOUTH THREE TIMES DAILY AS NEEDED FOR DIZZINESS 30 tablet 1   metFORMIN (GLUCOPHAGE) 500 MG tablet TAKE 1 TABLET(500 MG) BY MOUTH TWICE DAILY WITH A MEAL 60 tablet 2   methocarbamol (ROBAXIN) 500 MG tablet TAKE 1 TABLET(500 MG) BY MOUTH EVERY 8 HOURS AS NEEDED FOR MUSCLE SPASMS 30 tablet 1   Misc. Devices KIT Henmnii rollator walker 1 kit 0   montelukast (SINGULAIR) 10 MG tablet Take 10 mg by mouth at bedtime.     NITROSTAT 0.4 MG SL tablet Place 0.4 mg under the tongue every 15 (fifteen) minutes as needed for chest pain.      omeprazole (PRILOSEC) 40 MG capsule Take 40 mg by mouth 2 (two) times daily.     ondansetron (ZOFRAN-ODT) 8 MG disintegrating tablet DISSOLVE 1 TABLET(8 MG) ON THE TONGUE EVERY 8 HOURS AS NEEDED  FOR NAUSEA OR VOMITING 257 tablet 1   simethicone (GAS-X) 80 MG chewable tablet Chew 1 tablet (80 mg total) by mouth every 6 (six) hours as needed for flatulence. 30 tablet 0   Tiotropium Bromide-Olodaterol (STIOLTO RESPIMAT) 2.5-2.5 MCG/ACT AERS Inhale 2 each into the lungs daily. 4 g 11   tirzepatide (MOUNJARO) 2.5 MG/0.5ML Pen Inject 2.5  mg into the skin once a week. 2 mL 3   No facility-administered medications prior to visit.        Objective:   Physical Exam Vitals:   01/20/23 1134  BP: 118/64  Pulse: 82  SpO2: 97%  Weight: 159 lb 6.4 oz (72.3 kg)  Height: 5\' 8"  (1.727 m)   Gen: Pleasant, well-nourished, in no distress,  normal affect  ENT: No lesions,  mouth clear,  oropharynx clear, no postnasal drip  Neck: No JVD, no stridor  Lungs: No use of accessory muscles, no crackles or wheezing on normal respiration, B rhonchi on forced exp  Cardiovascular: RRR, heart sounds normal, no murmur or gallops, no peripheral edema  Musculoskeletal: No deformities, no cyanosis or clubbing  Neuro: alert, awake, non focal  Skin: Warm, no lesions or rash     Assessment & Plan:  Chronic obstructive pulmonary disease/Emphysema With acute flare in the setting of progressive allergy symptoms.  She is taking her allergy regimen as directed.  Wheezing, increased cough, purulent sputum.  Plan to treat for likely flare.  She is flaring about once annually (a lot less than she used to).  If her flare frequency increases then would favor adding ICS to her regimen.  We will give you a Depo-Medrol shot today. Please take prednisone until completely gone as directed Please take doxycycline 100 mg twice a day for 7 days Continue your Stiolto 2 puffs once daily. Keep albuterol available to use 2 puffs if needed for shortness of breath, chest tightness, wheezing.  We will refill this for you today. Follow with Dr Delton Coombes in 6 months or sooner if you have any problems  Allergic rhinitis Continue your Singulair, Zyrtec, Allegra as you have been using them  Chronic respiratory failure with hypoxia (HCC) Continue your oxygen at 2-3 L/min as you have been using it   Levy Pupa, MD, PhD 01/20/2023, 11:57 AM [facility] [phone] or if no answer [phone]

## 2023-01-20 NOTE — Assessment & Plan Note (Signed)
Continue your oxygen at 2-3 L/min as you have been using it

## 2023-01-20 NOTE — Assessment & Plan Note (Signed)
Continue your Singulair, Zyrtec, Allegra as you have been using them

## 2023-01-20 NOTE — Patient Instructions (Signed)
We will give you a Depo-Medrol shot today. Please take prednisone until completely gone as directed Please take doxycycline 100 mg twice a day for 7 days Continue your Stiolto 2 puffs once daily. Keep albuterol available to use 2 puffs if needed for shortness of breath, chest tightness, wheezing.  We will refill this for you today. Continue your oxygen at 2-3 L/min as you have been using it Continue your Singulair, Zyrtec, Allegra as you have been using them Follow with Dr Delton Coombes in 6 months or sooner if you have any problems

## 2023-01-21 DIAGNOSIS — J449 Chronic obstructive pulmonary disease, unspecified: Secondary | ICD-10-CM | POA: Diagnosis not present

## 2023-01-21 DIAGNOSIS — J9611 Chronic respiratory failure with hypoxia: Secondary | ICD-10-CM | POA: Diagnosis not present

## 2023-01-23 ENCOUNTER — Other Ambulatory Visit: Payer: 59

## 2023-01-23 ENCOUNTER — Inpatient Hospital Stay: Payer: 59

## 2023-01-24 ENCOUNTER — Telehealth: Payer: Self-pay | Admitting: Emergency Medicine

## 2023-01-24 NOTE — Telephone Encounter (Signed)
Called patient but she did not answer. Left message for patient to call back.  

## 2023-01-24 NOTE — Telephone Encounter (Signed)
Spoke with this pt of Dr Kavin Leech  She was seen on 01/20/23 and given the following instructions:   Instructions   Return in about 6 months (around 07/22/2023) for With Dr. Delton Coombes. We will give you a Depo-Medrol shot today. Please take prednisone until completely gone as directed Please take doxycycline 100 mg twice a day for 7 days Continue your Stiolto 2 puffs once daily. Keep albuterol available to use 2 puffs if needed for shortness of breath, chest tightness, wheezing.  We will refill this for you today. Continue your oxygen at 2-3 L/min as you have been using it Continue your Singulair, Zyrtec, Allegra as you have been using them Follow with Dr Delton Coombes in 6 months or sooner if you have any problems        She states that despite taking all of these meds, she feels like her breathing has been getting worse  She is wheezing and still coughing up green sputum  Denies any fever  Pred has helped in the past, but not working now  No appts to offer this wk

## 2023-01-24 NOTE — Telephone Encounter (Signed)
Patient states having side effects to medications. States feels worse. Patient phone number is [phone].

## 2023-01-25 ENCOUNTER — Telehealth: Payer: Self-pay | Admitting: Emergency Medicine

## 2023-01-25 NOTE — Telephone Encounter (Signed)
[employer] calling needing a request of most resent office notes sent to them FaX #[phone] and they sent [place] a for that needed to be filled out by provider

## 2023-01-26 NOTE — Telephone Encounter (Signed)
Can we please confirm that notes go to [employer] and that we have the form that needs to be completed?

## 2023-01-27 NOTE — Telephone Encounter (Signed)
Office notes faxed to [employer].

## 2023-01-30 ENCOUNTER — Ambulatory Visit: Payer: 59 | Admitting: Physician Assistant

## 2023-01-30 NOTE — Telephone Encounter (Signed)
Closing encounter, patient feeling better, no needs at this time.

## 2023-01-30 NOTE — Telephone Encounter (Addendum)
Pt returning missed call from Cherina. Pt states she feel better.

## 2023-01-31 ENCOUNTER — Telehealth: Payer: Self-pay | Admitting: Emergency Medicine

## 2023-01-31 NOTE — Telephone Encounter (Signed)
Fax # is [phone]  O2 company, [employer],  calling for chart notes for this pt. Please fax. Thanks.

## 2023-02-06 ENCOUNTER — Inpatient Hospital Stay: Payer: 59

## 2023-02-06 NOTE — Telephone Encounter (Signed)
Faxed ov note from 01/20/23 to [employer] at the fax number that was provided.

## 2023-02-07 ENCOUNTER — Inpatient Hospital Stay: Payer: 59

## 2023-02-08 ENCOUNTER — Telehealth: Payer: Self-pay | Admitting: *Deleted

## 2023-02-08 ENCOUNTER — Inpatient Hospital Stay: Payer: 59

## 2023-02-08 ENCOUNTER — Encounter: Payer: Self-pay | Admitting: *Deleted

## 2023-02-08 NOTE — Telephone Encounter (Signed)
Left a VM on patients cell phone advising that we have noticed a pattern in her scheduling/rescheduling appointments.  We advised that her patterns prevent other patients from getting these appointments and with her no showing or cancelling last minute.  I advised patient that the provider wants to delve into the reasoning for patients behaviors and see if there is any assistance that we can provide that will aide in her getting to her appts on time.  I asked the patient to return my call during business hours on Friday, advising that we are closed for the holiday tomorrow.  I left her my personal office number to call me back so we could discuss.

## 2023-02-10 ENCOUNTER — Ambulatory Visit (HOSPITAL_COMMUNITY): Payer: 59

## 2023-02-10 ENCOUNTER — Other Ambulatory Visit (HOSPITAL_COMMUNITY): Payer: 59

## 2023-02-14 ENCOUNTER — Inpatient Hospital Stay: Payer: 59 | Admitting: Physician Assistant

## 2023-02-14 ENCOUNTER — Ambulatory Visit: Payer: 59 | Admitting: Oncology

## 2023-02-14 ENCOUNTER — Ambulatory Visit: Payer: 59 | Admitting: Physician Assistant

## 2023-02-16 ENCOUNTER — Other Ambulatory Visit: Payer: Self-pay | Admitting: Internal Medicine

## 2023-02-19 ENCOUNTER — Other Ambulatory Visit: Payer: Self-pay | Admitting: Internal Medicine

## 2023-02-19 DIAGNOSIS — I5032 Chronic diastolic (congestive) heart failure: Secondary | ICD-10-CM

## 2023-02-20 ENCOUNTER — Encounter: Payer: Self-pay | Admitting: Urology

## 2023-02-20 ENCOUNTER — Ambulatory Visit: Payer: 59 | Admitting: Urology

## 2023-02-20 VITALS — BP 147/70 | HR 108

## 2023-02-20 DIAGNOSIS — R3915 Urgency of urination: Secondary | ICD-10-CM

## 2023-02-20 DIAGNOSIS — R39198 Other difficulties with micturition: Secondary | ICD-10-CM

## 2023-02-20 DIAGNOSIS — R350 Frequency of micturition: Secondary | ICD-10-CM

## 2023-02-20 DIAGNOSIS — J9611 Chronic respiratory failure with hypoxia: Secondary | ICD-10-CM | POA: Diagnosis not present

## 2023-02-20 DIAGNOSIS — J449 Chronic obstructive pulmonary disease, unspecified: Secondary | ICD-10-CM | POA: Diagnosis not present

## 2023-02-20 LAB — URINALYSIS, ROUTINE W REFLEX MICROSCOPIC
Bilirubin, UA: NEGATIVE
Ketones, UA: NEGATIVE
Leukocytes,UA: NEGATIVE
Nitrite, UA: NEGATIVE
Protein,UA: NEGATIVE
RBC, UA: NEGATIVE
Specific Gravity, UA: 1.015 (ref 1.005–1.030)
Urobilinogen, Ur: 0.2 mg/dL (ref 0.2–1.0)
pH, UA: 6 (ref 5.0–7.5)

## 2023-02-20 NOTE — Progress Notes (Unsigned)
02/20/2023 2:58 PM   Kayla Ryan 06-Feb-1956 [id number]  Referring provider: Anabel Halon, MD [address] [place],  [place] [address]  No chief complaint on file.   HPI:  New pt -   1) weak stream, hesitancy - going on for many years. She has frequency and urgency. She drinks water. She has constipation. MOM helps. No medicines for bladder. She had a bladder "tuck" and rectal "tuck" in her 40s. She wears a liner but rare incontinence or with a coughing spell.   She had diabetic neuropathy. UA clear. PVR 13 ml. She has chronic bronchitis and is on home 02.   Today, seen for the above.    PMH: Past Medical History:  Diagnosis Date   Allergy Unknown   Anemia    Anxiety 1883   Arthritis    Asthma    Chronic bronchitis    Crohn disease (HCC)    Emphysema    Fibromyalgia    GERD (gastroesophageal reflux disease)    GI bleed    Herpes    History of blood transfusion    Hyperlipidemia    Hypertension 2015   IBS (irritable bowel syndrome)    Kidney failure    Migraines    Muscular dystrophy (HCC)    Neck pain    PAF (paroxysmal atrial fibrillation) (HCC)    Not anticoagulated with history of severe GI bleeding   Plantar fasciitis    Polymyositis (HCC)    PONV (postoperative nausea and vomiting)    Right thyroid nodule 04/07/2022   Scoliosis    Type 2 diabetes mellitus (HCC)     Surgical History: Past Surgical History:  Procedure Laterality Date   ABDOMINAL HYSTERECTOMY     AGILE CAPSULE N/A 06/24/2021   Procedure: AGILE CAPSULE;  Surgeon: Lanelle Bal, DO;  Location: [facility];  Service: Endoscopy;  Laterality: N/A;   AGILE CAPSULE N/A 07/22/2021   Procedure: AGILE CAPSULE;  Surgeon: Corbin Ade, MD;  Location: [facility];  Service: Endoscopy;  Laterality: N/A;  7:30am   bone spur     BREAST SURGERY  1995   CHOLECYSTECTOMY     COLONOSCOPY WITH PROPOFOL N/A 04/30/2021   Procedure: COLONOSCOPY WITH PROPOFOL;  Surgeon: Corbin Ade,  MD;  [facility]: [facility];  Service: Endoscopy;  Laterality: N/A;   ESOPHAGEAL DILATION N/A 04/30/2021   Procedure: ESOPHAGEAL DILATION;  Surgeon: Corbin Ade, MD;  [facility]: [facility];  Service: Endoscopy;  Laterality: N/A;   ESOPHAGOGASTRODUODENOSCOPY (EGD) WITH PROPOFOL N/A 04/30/2021   Procedure: ESOPHAGOGASTRODUODENOSCOPY (EGD) WITH PROPOFOL;  Surgeon: Corbin Ade, MD;  [facility]: [facility];  Service: Endoscopy;  Laterality: N/A;   GIVENS CAPSULE STUDY N/A 09/22/2021   Procedure: GIVENS CAPSULE STUDY;  Surgeon: Corbin Ade, MD;  Location: [facility];  Service: Endoscopy;  Laterality: N/A;  7:30am   HERNIA REPAIR     LEFT HEART CATHETERIZATION WITH CORONARY ANGIOGRAM N/A 11/07/2013   Procedure: LEFT HEART CATHETERIZATION WITH CORONARY ANGIOGRAM;  Surgeon: Runell Gess, MD;  Location: [facility] C[facility];  Service: Cardiovascular;  Laterality: N/A;   MASTECTOMY PARTIAL / LUMPECTOMY     OOPHORECTOMY     ROTATOR CUFF REPAIR     TOOTH EXTRACTION N/A 04/15/2022   Procedure: DENTAL RESTORATION/EXTRACTIONS;  Surgeon: Ocie Doyne, DMD;  Location: [facility];  Service: Oral Surgery;  Laterality: N/A;    Home Medications:  Allergies as of 02/20/2023  Reactions   Sulfa Antibiotics Shortness Of Breath, Swelling, Rash   Clindamycin/lincomycin    Dilaudid [hydromorphone Hcl]    Makes me crazy   Iron    Throat starts closing up, tongue swells, severe headaches and backpain   Metronidazole Diarrhea, Nausea And Vomiting   Prednisone Other (See Comments)   Angry   Statins Other (See Comments)   Unknown    Cephalexin Diarrhea, Nausea And Vomiting   Methotrexate Derivatives Swelling, Rash   Morphine And Codeine Anxiety   "exreme mood swings"        Medication List        Accurate as of February 20, 2023  2:58 PM. If you have any questions, ask your nurse or doctor.          Accu-Chek Guide test strip Generic drug: glucose blood USE TO CHECK BLOOD GLUCOSE THREE  TIMES DAILY, MORNING, AT NOON, AND AT BEDTIME   Accu-Chek Softclix Lancet Dev Kit USE TO CHECK GLUCOSE THREE TIMES DAILY   Accu-Chek Softclix Lancets lancets USE TO TEST BLOOD SUGAR EVERY MORNING, AT NOON AND EVERY NIGHT AT BEDTIME   acyclovir 400 MG tablet Commonly known as: ZOVIRAX Take 400 mg by mouth every evening.   cetirizine 10 MG tablet Commonly known as: ZYRTEC Take 10 mg by mouth 2 (two) times daily.   Dexcom G7 Receiver Devi USE TO CHECK BLOOD GLUCOSE AS NEEDED   Dexcom G7 Sensor Misc USE TO CHECK BLOOD SUGAR THREE TIMES DAILY BEFORE MEALS AND AT BEDTIME AND AS NEEDED. CHANGE SENSOR EVERY 10 DAYS   dicyclomine 10 MG capsule Commonly known as: BENTYL Take 1 capsule (10 mg total) by mouth 4 (four) times daily -  before meals and at bedtime. Monitor for constipation, dry mouth, dizziness What changed:  when to take this reasons to take this   diphenhydrAMINE 25 MG tablet Commonly known as: BENADRYL Take 25 mg by mouth every 4 (four) hours as needed for allergies or itching.   doxepin 10 MG capsule Commonly known as: SINEQUAN TAKE 1 CAPSULE(10 MG) BY MOUTH AT BEDTIME   doxycycline 100 MG tablet Commonly known as: VIBRA-TABS Take 1 tablet (100 mg total) by mouth 2 (two) times daily.   DRY EYE RELIEF DROPS OP Place 1 drop into both eyes 3 (three) times daily as needed (Dry eye).   DULoxetine 60 MG capsule Commonly known as: CYMBALTA Take 1 capsule (60 mg total) by mouth 2 (two) times daily. What changed: when to take this   gabapentin 800 MG tablet Commonly known as: NEURONTIN TAKE 1 TABLET(800 MG) BY MOUTH THREE TIMES DAILY   gemfibrozil 600 MG tablet Commonly known as: LOPID Take 1 tablet (600 mg total) by mouth 2 (two) times daily before a meal.   Gvoke HypoPen 2-Pack 1 MG/0.2ML Soaj Generic drug: Glucagon Inject 0.2 mLs into the skin as needed (Blood glucose < 53).   HYDROcodone-acetaminophen 10-325 MG tablet Commonly known as: NORCO Take 1  tablet by mouth every 4 (four) hours as needed. Stop Hydrocodone 7.5/325mg  tablet.   ipratropium 17 MCG/ACT inhaler Commonly known as: ATROVENT HFA Inhale 2 puffs into the lungs every 4 (four) hours as needed (COPD).   isosorbide dinitrate 30 MG tablet Commonly known as: ISORDIL TAKE 1 TABLET(30 MG) BY MOUTH DAILY   Jardiance 10 MG Tabs tablet Generic drug: empagliflozin TAKE 1 TABLET(10 MG) BY MOUTH DAILY BEFORE BREAKFAST   LORazepam 0.5 MG tablet Commonly known as: ATIVAN TAKE 1 TABLET(0.5 MG) BY MOUTH THREE TIMES  DAILY AS NEEDED FOR ANXIETY   meclizine 25 MG tablet Commonly known as: ANTIVERT TAKE 1 TABLET(25 MG) BY MOUTH THREE TIMES DAILY AS NEEDED FOR DIZZINESS   metFORMIN 500 MG tablet Commonly known as: GLUCOPHAGE TAKE 1 TABLET(500 MG) BY MOUTH TWICE DAILY WITH A MEAL   methocarbamol 500 MG tablet Commonly known as: ROBAXIN TAKE 1 TABLET(500 MG) BY MOUTH EVERY 8 HOURS AS NEEDED FOR MUSCLE SPASMS   Misc. Devices Kit Henmnii rollator walker   montelukast 10 MG tablet Commonly known as: SINGULAIR Take 10 mg by mouth at bedtime.   Nitrostat 0.4 MG SL tablet Generic drug: nitroGLYCERIN Place 0.4 mg under the tongue every 15 (fifteen) minutes as needed for chest pain.   omeprazole 40 MG capsule Commonly known as: PRILOSEC Take 40 mg by mouth 2 (two) times daily.   ondansetron 8 MG disintegrating tablet Commonly known as: ZOFRAN-ODT DISSOLVE 1 TABLET(8 MG) ON THE TONGUE EVERY 8 HOURS AS NEEDED FOR NAUSEA OR VOMITING   predniSONE 10 MG tablet Commonly known as: DELTASONE Take 4 tabs by mouth for 3 days, then 3 for 3 days, 2 for 3 days, 1 for 3 days and stop   simethicone 80 MG chewable tablet Commonly known as: Gas-X Chew 1 tablet (80 mg total) by mouth every 6 (six) hours as needed for flatulence.   Stiolto Respimat 2.5-2.5 MCG/ACT Aers Generic drug: Tiotropium Bromide-Olodaterol Inhale 2 each into the lungs daily.   tirzepatide 2.5 MG/0.5ML Pen Commonly  known as: MOUNJARO Inject 2.5 mg into the skin once a week.        Allergies:  Allergies  Allergen Reactions   Sulfa Antibiotics Shortness Of Breath, Swelling and Rash   Clindamycin/Lincomycin    Dilaudid [Hydromorphone Hcl]     Makes me crazy   Iron     Throat starts closing up, tongue swells, severe headaches and backpain   Metronidazole Diarrhea and Nausea And Vomiting   Prednisone Other (See Comments)    Angry     Statins Other (See Comments)    Unknown    Cephalexin Diarrhea and Nausea And Vomiting   Methotrexate Derivatives Swelling and Rash   Morphine And Codeine Anxiety    "exreme mood swings"    Family History: Family History  Problem Relation Age of Onset   Lung cancer Mother    Cancer Mother    Miscarriages / [place] Mother    Varicose Veins Mother    COPD Father    Lung cancer Father    Lymphoma Father    Alcohol abuse Father    Arthritis Father    Anxiety disorder Sister    Depression Brother    Anxiety disorder Sister     Social History:  reports that she quit smoking about 17 years ago. Her smoking use included cigarettes. She started smoking about 56 years ago. She has a 49 pack-year smoking history. She has never used smokeless tobacco. She reports that she does not drink alcohol and does not use drugs.   Physical Exam: There were no vitals taken for this visit.  Constitutional:  Alert and oriented, No acute distress. HEENT: [place] AT, moist mucus membranes.  Trachea midline, no masses. Cardiovascular: No clubbing, cyanosis, or edema. Respiratory: Normal respiratory effort, no increased work of breathing. GI: Abdomen is soft, nontender, nondistended, no abdominal masses GU: No CVA tenderness Skin: No rashes, bruises or suspicious lesions. Neurologic: Grossly intact, no focal deficits, moving all 4 extremities. Psychiatric: Normal mood and affect.  Laboratory Data:  Lab Results  Component Value Date   WBC 6.6 12/08/2022   HGB 12.2  12/08/2022   HCT 37.7 12/08/2022   MCV 91 12/08/2022   PLT 474 (H) 12/08/2022    Lab Results  Component Value Date   CREATININE 1.00 12/08/2022    No results found for: "PSA"  No results found for: "TESTOSTERONE"  Lab Results  Component Value Date   HGBA1C 5.3 12/08/2022    Urinalysis    Component Value Date/Time   COLORURINE STRAW (A) 02/25/2022 1636   APPEARANCEUR CLEAR 02/25/2022 1636   LABSPEC 1.018 02/25/2022 1636   PHURINE 6.0 02/25/2022 1636   GLUCOSEU >=500 (A) 02/25/2022 1636   HGBUR SMALL (A) 02/25/2022 1636   BILIRUBINUR NEGATIVE 02/25/2022 1636   BILIRUBINUR small (A) 12/13/2020 1559   KETONESUR NEGATIVE 02/25/2022 1636   PROTEINUR NEGATIVE 02/25/2022 1636   UROBILINOGEN 2.0 (A) 12/13/2020 1559   UROBILINOGEN 0.2 06/08/2011 1400   NITRITE NEGATIVE 02/25/2022 1636   LEUKOCYTESUR TRACE (A) 02/25/2022 1636    Lab Results  Component Value Date   LABMICR <3.0 06/03/2022   BACTERIA RARE (A) 02/25/2022    Pertinent Imaging:  Results for orders placed during the hospital encounter of 07/22/21  DG Abd 1 View - KUB  Narrative CLINICAL DATA:  Constipation for 8 days. Looking for capsule from endoscopy. History of Crohn's disease and irritable bowel.  EXAM: ABDOMEN - 1 VIEW  COMPARISON:  June 26, 2021  FINDINGS: The capsule is in the mid descending colon. Moderate fecal loading in the colon. No other acute abnormalities.  IMPRESSION: The capsule is in the mid descending colon. Moderate fecal loading throughout the colon. No other abnormalities.   Electronically Signed By: Gerome Sam III M.D. On: 07/24/2021 19:40  Results for orders placed during the hospital encounter of 06/07/19  US Venous Img Lower Bilateral  Narrative CLINICAL DATA:  Lower extremity edema.  EXAM: BILATERAL LOWER EXTREMITY VENOUS DOPPLER ULTRASOUND  TECHNIQUE: Gray-scale sonography with graded compression, as well as color Doppler and duplex ultrasound  were performed to evaluate the lower extremity deep venous systems from the level of the common femoral vein and including the common femoral, femoral, profunda femoral, popliteal and calf veins including the posterior tibial, peroneal and gastrocnemius veins when visible. The superficial great saphenous vein was also interrogated. Spectral Doppler was utilized to evaluate flow at rest and with distal augmentation maneuvers in the common femoral, femoral and popliteal veins.  COMPARISON:  None.  FINDINGS: RIGHT LOWER EXTREMITY  Common Femoral Vein: No evidence of thrombus. Normal compressibility, respiratory phasicity and response to augmentation.  Saphenofemoral Junction: No evidence of thrombus. Normal compressibility and flow on color Doppler imaging.  Profunda Femoral Vein: No evidence of thrombus. Normal compressibility and flow on color Doppler imaging.  Femoral Vein: No evidence of thrombus. Normal compressibility, respiratory phasicity and response to augmentation.  Popliteal Vein: No evidence of thrombus. Normal compressibility, respiratory phasicity and response to augmentation.  Calf Veins: No evidence of thrombus. Normal compressibility and flow on color Doppler imaging.  Superficial Great Saphenous Vein: No evidence of thrombus. Normal compressibility.  Venous Reflux:  None.  Other Findings:  None.  LEFT LOWER EXTREMITY  Common Femoral Vein: No evidence of thrombus. Normal compressibility, respiratory phasicity and response to augmentation.  Saphenofemoral Junction: No evidence of thrombus. Normal compressibility and flow on color Doppler imaging.  Profunda Femoral Vein: No evidence of thrombus. Normal compressibility and flow on color Doppler imaging.  Femoral Vein: No evidence of thrombus.  Normal compressibility, respiratory phasicity and response to augmentation.  Popliteal Vein: No evidence of thrombus. Normal compressibility, respiratory  phasicity and response to augmentation.  Calf Veins: No evidence of thrombus. Normal compressibility and flow on color Doppler imaging.  Superficial Great Saphenous Vein: No evidence of thrombus. Normal compressibility.  Venous Reflux:  None.  Other Findings:  None.  IMPRESSION: No evidence of deep venous thrombosis in either lower extremity.   Electronically Signed By: Bary Richard M.D. On: 06/09/2019 10:34    Assessment & Plan:    1. Difficulty urinating, freq and urgency - may be an OAB issue. Disc PT, meds and procedures. She will consider. UA and PVR reassuring. Given prior surgery - f/u for exam and cystoscopy.     No follow-ups on file.  Jerilee Field, MD  [facility]  [address] [place], [place] [address] [phone]

## 2023-02-21 ENCOUNTER — Telehealth: Payer: Self-pay | Admitting: Emergency Medicine

## 2023-02-21 NOTE — Telephone Encounter (Signed)
Patient states having symptoms of cough, sore throat and mucus. Would like cough syrup called into pharmacy. Pharmacy is [facility] [address] Patient phone number is [phone].

## 2023-02-22 ENCOUNTER — Ambulatory Visit: Payer: 59 | Attending: Cardiology | Admitting: Cardiology

## 2023-02-22 ENCOUNTER — Encounter: Payer: Self-pay | Admitting: Cardiology

## 2023-02-22 VITALS — BP 138/64 | HR 84 | Ht 68.0 in | Wt 163.6 lb

## 2023-02-22 DIAGNOSIS — T466X5A Adverse effect of antihyperlipidemic and antiarteriosclerotic drugs, initial encounter: Secondary | ICD-10-CM | POA: Diagnosis not present

## 2023-02-22 DIAGNOSIS — E782 Mixed hyperlipidemia: Secondary | ICD-10-CM

## 2023-02-22 DIAGNOSIS — Z79899 Other long term (current) drug therapy: Secondary | ICD-10-CM | POA: Diagnosis not present

## 2023-02-22 DIAGNOSIS — M791 Myalgia, unspecified site: Secondary | ICD-10-CM

## 2023-02-22 DIAGNOSIS — I779 Disorder of arteries and arterioles, unspecified: Secondary | ICD-10-CM | POA: Diagnosis not present

## 2023-02-22 DIAGNOSIS — I5032 Chronic diastolic (congestive) heart failure: Secondary | ICD-10-CM | POA: Diagnosis not present

## 2023-02-22 MED ORDER — HYDROCODONE BIT-HOMATROP MBR 5-1.5 MG/5ML PO SOLN: 5.0000 mL | Freq: Four times a day (QID) | ORAL | 0 refills | Status: DC | PRN

## 2023-02-22 MED ORDER — EZETIMIBE 10 MG PO TABS: 10.0000 mg | ORAL_TABLET | Freq: Every day | ORAL | 3 refills | Status: DC

## 2023-02-22 NOTE — Patient Instructions (Signed)
Medication Instructions:  START Zetia 10 mg daily  Labwork: In 6 months:Fasting Lipids  Testing/Procedures: Your physician has requested that you have an echocardiogram. Echocardiography is a painless test that uses sound waves to create images of your heart. It provides your doctor with information about the size and shape of your heart and how well your heart's chambers and valves are working. This procedure takes approximately one hour. There are no restrictions for this procedure. Please do NOT wear cologne, perfume, aftershave, or lotions (deodorant is allowed). Please arrive 15 minutes prior to your appointment time.     Your physician has requested that you have a carotid duplex. This test is an ultrasound of the carotid arteries in your neck. It looks at blood flow through these arteries that supply the brain with blood. Allow one hour for this exam. There are no restrictions or special instructions.   Follow-Up: 6 months  Any Other Special Instructions Will Be Listed Below (If Applicable).  If you need a refill on your cardiac medications before your next appointment, please call your pharmacy.

## 2023-02-22 NOTE — Progress Notes (Signed)
Cardiology Office Note  Date: 02/22/2023   ID: [name], DOB 08/13/1955, MRN [id number]  PCP: Anabel Halon, MD  Chief Complaint:  Chief Complaint  Patient presents with   Establish cardiology follow-up   History of Present Illness: ERIANNA Ryan is a 67 y.o. female referred for cardiology consultation by Dr. Allena Katz with diagnosis of HFpEF.  I reviewed the chart.  She reports chronic NYHA class II-III dyspnea depending on level of activity, wears supplemental oxygen continuously with history of COPD and chronic hypoxic respiratory failure followed by Dr. Delton Coombes.  She states that weight fluctuates but she does not have any substantial pitting edema and has not been on diuretics for a few years.  Her current diabetes regimen includes Mounjaro and Jardiance which are likely concurrently managing her HFpEF.  She underwent a cardiac catheterization with Dr. Allyson Sabal back in 2015 demonstrating normal coronary arteries.  Her last echocardiogram was in September 2022 at which point LVEF was 65 to 70% with indeterminate diastolic parameters, normal RV contraction with mildly elevated estimated PASP.  Records indicate diagnosis of paroxysmal atrial fibrillation with RVR initially in December 2021 during hospitalization.  CHA2DS2-VASc score is 5, she has not been anticoagulated thus far with history of severe GI bleeding.  She does not report any progressive palpitations.  ECG today shows sinus rhythm with rightward axis and R' in lead V1.  Review of Systems: As outlined in the history of present illness.  No orthopnea or PND.  No angina.  No unexplained syncope.  Past Medical History: Past Medical History:  Diagnosis Date   Allergy Unknown   Anemia    Anxiety 1883   Arthritis    Asthma    Chronic bronchitis    Crohn disease (HCC)    Emphysema    Fibromyalgia    GERD (gastroesophageal reflux disease)    GI bleed    Herpes    History of blood transfusion    Hyperlipidemia     Hypertension 2015   IBS (irritable bowel syndrome)    Kidney failure    Migraines    Muscular dystrophy (HCC)    Neck pain    PAF (paroxysmal atrial fibrillation) (HCC)    Not anticoagulated with history of severe GI bleeding   Plantar fasciitis    Polymyositis (HCC)    PONV (postoperative nausea and vomiting)    Right thyroid nodule 04/07/2022   Scoliosis    Type 2 diabetes mellitus (HCC)    Past Surgical History: Past Surgical History:  Procedure Laterality Date   ABDOMINAL HYSTERECTOMY     AGILE CAPSULE N/A 06/24/2021   Procedure: AGILE CAPSULE;  Surgeon: Lanelle Bal, DO;  [facility]: [facility];  Service: Endoscopy;  Laterality: N/A;   AGILE CAPSULE N/A 07/22/2021   Procedure: AGILE CAPSULE;  Surgeon: Corbin Ade, MD;  Location: [facility];  Service: Endoscopy;  Laterality: N/A;  7:30am   bone spur     BREAST SURGERY  1995   CHOLECYSTECTOMY     COLONOSCOPY WITH PROPOFOL N/A 04/30/2021   Procedure: COLONOSCOPY WITH PROPOFOL;  Surgeon: Corbin Ade, MD;  Location: [facility];  Service: Endoscopy;  Laterality: N/A;   ESOPHAGEAL DILATION N/A 04/30/2021   Procedure: ESOPHAGEAL DILATION;  Surgeon: Corbin Ade, MD;  [facility]: [facility];  Service: Endoscopy;  Laterality: N/A;   ESOPHAGOGASTRODUODENOSCOPY (EGD) WITH PROPOFOL N/A 04/30/2021   Procedure: ESOPHAGOGASTRODUODENOSCOPY (EGD) WITH PROPOFOL;  Surgeon: Corbin Ade, MD;  Location: [facility];  Service: Endoscopy;  Laterality: N/A;   GIVENS CAPSULE STUDY N/A 09/22/2021   Procedure: GIVENS CAPSULE STUDY;  Surgeon: Corbin Ade, MD;  Location: [facility];  Service: Endoscopy;  Laterality: N/A;  7:30am   HERNIA REPAIR     LEFT HEART CATHETERIZATION WITH CORONARY ANGIOGRAM N/A 11/07/2013   Procedure: LEFT HEART CATHETERIZATION WITH CORONARY ANGIOGRAM;  Surgeon: Runell Gess, MD;  Location: [facility] CATH [facility];  Service: Cardiovascular;  Laterality: N/A;   MASTECTOMY PARTIAL / LUMPECTOMY      OOPHORECTOMY     ROTATOR CUFF REPAIR     TOOTH EXTRACTION N/A 04/15/2022   Procedure: DENTAL RESTORATION/EXTRACTIONS;  Surgeon: Ocie Doyne, DMD;  Location: [facility] OR;  Service: Oral Surgery;  Laterality: N/A;   Family History: Family History  Problem Relation Age of Onset   Lung cancer Mother    Cancer Mother    Miscarriages / Stillbirths Mother    Varicose Veins Mother    COPD Father    Lung cancer Father    Lymphoma Father    Alcohol abuse Father    Arthritis Father    Anxiety disorder Sister    Depression Brother    Anxiety disorder Sister    Social History:  Social History   Tobacco Use   Smoking status: Former    Current packs/day: 0.00    Average packs/day: 1 pack/day for 49.0 years (49.0 ttl pk-yrs)    Types: Cigarettes    Start date: 08/08/1966    Quit date: 08/31/2005    Years since quitting: 17.4   Smokeless tobacco: Never  Substance Use Topics   Alcohol use: No   Medications: Current Outpatient Medications on File Prior to Visit  Medication Sig Dispense Refill   ACCU-CHEK GUIDE test strip USE TO CHECK BLOOD GLUCOSE THREE TIMES DAILY, MORNING, AT NOON, AND AT BEDTIME 100 strip 0   Accu-Chek Softclix Lancets lancets USE TO TEST BLOOD SUGAR EVERY MORNING, AT NOON AND EVERY NIGHT AT BEDTIME 100 each 0   acyclovir (ZOVIRAX) 400 MG tablet Take 400 mg by mouth every evening.     cetirizine (ZYRTEC) 10 MG tablet Take 10 mg by mouth 2 (two) times daily.     Continuous Blood Gluc Sensor (DEXCOM G7 SENSOR) MISC USE TO CHECK BLOOD SUGAR THREE TIMES DAILY BEFORE MEALS AND AT BEDTIME AND AS NEEDED. CHANGE SENSOR EVERY 10 DAYS 3 each 5   Continuous Glucose Receiver (DEXCOM G7 RECEIVER) DEVI USE TO CHECK BLOOD GLUCOSE AS NEEDED 1 each 0   dicyclomine (BENTYL) 10 MG capsule Take 1 capsule (10 mg total) by mouth 4 (four) times daily -  before meals and at bedtime. Monitor for constipation, dry mouth, dizziness (Patient taking differently: Take 10 mg by mouth 4 (four) times daily as  needed (Bowel Spasm/Diarrhea). Monitor for constipation, dry mouth, dizziness) 120 capsule 3   diphenhydrAMINE (BENADRYL) 25 MG tablet Take 25 mg by mouth every 4 (four) hours as needed for allergies or itching.     doxepin (SINEQUAN) 10 MG capsule TAKE 1 CAPSULE(10 MG) BY MOUTH AT BEDTIME 30 capsule 3   DULoxetine (CYMBALTA) 60 MG capsule Take 1 capsule (60 mg total) by mouth 2 (two) times daily. (Patient taking differently: Take 60 mg by mouth every evening.) 60 capsule 3   gabapentin (NEURONTIN) 800 MG tablet TAKE 1 TABLET(800 MG) BY MOUTH THREE TIMES DAILY 90 tablet 5   gemfibrozil (LOPID) 600 MG tablet Take 1 tablet (600 mg total) by mouth 2 (two)  times daily before a meal. 180 tablet 1   Glucagon (GVOKE HYPOPEN 2-PACK) 1 MG/0.2ML SOAJ Inject 0.2 mLs into the skin as needed (Blood glucose < 53). 0.4 mL 5   Glycerin-Hypromellose-PEG 400 (DRY EYE RELIEF DROPS OP) Place 1 drop into both eyes 3 (three) times daily as needed (Dry eye).     HYDROcodone bit-homatropine (HYCODAN) 5-1.5 MG/5ML syrup Take 5 mLs by mouth every 6 (six) hours as needed for cough. 120 mL 0   HYDROcodone-acetaminophen (NORCO) 10-325 MG tablet Take 1 tablet by mouth every 4 (four) hours as needed. Stop Hydrocodone 7.5/325mg  tablet. 180 tablet 0   ipratropium (ATROVENT HFA) 17 MCG/ACT inhaler Inhale 2 puffs into the lungs every 4 (four) hours as needed (COPD). 1 each 4   isosorbide dinitrate (ISORDIL) 30 MG tablet TAKE 1 TABLET(30 MG) BY MOUTH DAILY 90 tablet 1   JARDIANCE 10 MG TABS tablet TAKE 1 TABLET(10 MG) BY MOUTH DAILY BEFORE BREAKFAST 30 tablet 5   Lancets Misc. (ACCU-CHEK SOFTCLIX LANCET DEV) KIT USE TO CHECK GLUCOSE THREE TIMES DAILY 1 kit 0   LORazepam (ATIVAN) 0.5 MG tablet TAKE 1 TABLET(0.5 MG) BY MOUTH THREE TIMES DAILY AS NEEDED FOR ANXIETY 90 tablet 3   meclizine (ANTIVERT) 25 MG tablet TAKE 1 TABLET(25 MG) BY MOUTH THREE TIMES DAILY AS NEEDED FOR DIZZINESS 30 tablet 1   metFORMIN (GLUCOPHAGE) 500 MG tablet TAKE  1 TABLET(500 MG) BY MOUTH TWICE DAILY WITH A MEAL 60 tablet 2   methocarbamol (ROBAXIN) 500 MG tablet TAKE 1 TABLET(500 MG) BY MOUTH EVERY 8 HOURS AS NEEDED FOR MUSCLE SPASMS 30 tablet 1   Misc. Devices KIT Henmnii rollator walker 1 kit 0   montelukast (SINGULAIR) 10 MG tablet Take 10 mg by mouth at bedtime.     NITROSTAT 0.4 MG SL tablet Place 0.4 mg under the tongue every 15 (fifteen) minutes as needed for chest pain.      omeprazole (PRILOSEC) 40 MG capsule Take 40 mg by mouth 2 (two) times daily.     ondansetron (ZOFRAN-ODT) 8 MG disintegrating tablet DISSOLVE 1 TABLET(8 MG) ON THE TONGUE EVERY 8 HOURS AS NEEDED FOR NAUSEA OR VOMITING 257 tablet 1   simethicone (GAS-X) 80 MG chewable tablet Chew 1 tablet (80 mg total) by mouth every 6 (six) hours as needed for flatulence. 30 tablet 0   Tiotropium Bromide-Olodaterol (STIOLTO RESPIMAT) 2.5-2.5 MCG/ACT AERS Inhale 2 each into the lungs daily. 4 g 11   tirzepatide (MOUNJARO) 2.5 MG/0.5ML Pen Inject 2.5 mg into the skin once a week. 2 mL 3   No current facility-administered medications on file prior to visit.   Allergies: Allergies  Allergen Reactions   Sulfa Antibiotics Shortness Of Breath, Swelling and Rash   Clindamycin/Lincomycin    Dilaudid [Hydromorphone Hcl]     Makes me crazy   Iron     Throat starts closing up, tongue swells, severe headaches and backpain   Metronidazole Diarrhea and Nausea And Vomiting   Prednisone Other (See Comments)    Angry     Statins Other (See Comments)    Unknown    Cephalexin Diarrhea and Nausea And Vomiting   Methotrexate Derivatives Swelling and Rash   Morphine And Codeine Anxiety    "exreme mood swings"   Physical Exam: VS:  BP 138/64 (BP Location: Left Arm, Patient Position: Sitting, Cuff Size: Normal)   Pulse 84   Ht 5\' 8"  (1.727 m)   Wt 163 lb 9.6 oz (74.2 kg)   SpO2  96%   BMI 24.88 kg/m , BMI Body mass index is 24.88 kg/m.  Wt Readings from Last 3 Encounters:  02/22/23 163 lb 9.6  oz (74.2 kg)  01/20/23 159 lb 6.4 oz (72.3 kg)  12/08/22 149 lb 12.8 oz (67.9 kg)    General: Patient appears comfortable at rest.  Wearing supplemental oxygen via nasal cannula. HEENT: Conjunctiva and lids normal. Neck: Supple, no elevated JVP or carotid bruits. Lungs: Decreased breath sounds without wheezing or rhonchi. Cardiac: Regular rate and rhythm, no S3 or significant systolic murmur, no pericardial rub. Abdomen: Soft, nontender, bowel sounds present. Extremities: No pitting edema, distal pulses 2+. Skin: Warm and dry. Musculoskeletal: No kyphosis. Neuropsychiatric: Alert and oriented x3, affect grossly appropriate.  ECG:  An ECG dated 04/15/2022 was personally reviewed today and demonstrated:  Sinus rhythm with prolonged PR interval, R' in lead V1 and V2.  Labwork: 09/01/2022: TSH 3.800 12/08/2022: ALT 31; AST 26; BUN 39; Creatinine, Ser 1.00; Hemoglobin 12.2; Platelets 474; Potassium 4.7; Sodium 140     Component Value Date/Time   CHOL 200 (H) 12/08/2022 1547   TRIG 216 (H) 12/08/2022 1547   HDL 46 12/08/2022 1547   CHOLHDL 4.3 12/08/2022 1547   LDLCALC 116 (H) 12/08/2022 1547   Other Studies Reviewed Today:  Carotid Dopplers 06/08/2019: IMPRESSION: 1. Mild plaque in the proximal left ICA. Although velocities technically correspond to an estimated 50-69% stenosis, estimated stenosis is felt to more likely be 50% or less based on the mild amount of plaque present. 2. Mild plaque at the level of the right carotid bulb and proximal right ICA. Estimated right ICA stenosis is less than 50%.  Echocardiogram 05/02/2021:  1. Left ventricular ejection fraction, by estimation, is 65 to 70%. The  left ventricle has normal function. The left ventricle has no regional  wall motion abnormalities. Left ventricular diastolic parameters are  indeterminate.   2. Right ventricular systolic function is normal. The right ventricular  size is normal. There is mildly elevated pulmonary  artery systolic  pressure.   3. The mitral valve is normal in structure. Trivial mitral valve  regurgitation. No evidence of mitral stenosis.   4. The aortic valve was not well visualized. Aortic valve regurgitation  is not visualized. No aortic stenosis is present.   5. The inferior vena cava is normal in size with greater than 50%  respiratory variability, suggesting right atrial pressure of 3 mmHg.   Assessment and Plan:  1.  History of HFpEF per chart review, LVEF 65 to 70% by echocardiogram in September 2022.  RV contraction normal with mildly elevated estimated PASP at that time.  Not on standing diuretic but concurrently on Jardiance and Mounjaro with diagnosis of type 2 diabetes mellitus.  Plan to update echocardiogram.  May ultimately be that we prescribe an as needed diuretic if necessary.  2.  History of atrial fibrillation documented during hospitalization in December 2021.  CHA2DS2-VASc score is 5.  She has not been anticoagulated with history of severe GI bleeding.  No obvious recurring arrhythmia, would continue with observation for now.  3.  Asymptomatic carotid artery disease.  Last carotid Dopplers were in 2020 as outlined above with moderate LICA stenosis and mild RICA stenosis.  Plan to obtain updated carotid Dopplers.  4.  Mixed hyperlipidemia with history of statin myalgias.  She is currently on gemfibrozil.  LDL was 116 in May.  Try addition of Zetia 10 mg daily for now.  If not tolerated or ineffective can refer  to lipid clinic to discuss possibility of PCSK9 inhibitors.  Disposition:  Follow up  6 months.  Signed, Jonelle Sidle, M.D., F.A.C.C. [facility]

## 2023-02-22 NOTE — Telephone Encounter (Signed)
I called and spoke with the pt and notified of response per Dr Lamonte Sakai  She verbalized understanding  Nothing further needed

## 2023-02-22 NOTE — Telephone Encounter (Signed)
Spoke with the pt  She is c/o increased cough- non prod over the past 3 days  She states that she feels like she has some PND  Denies any increased SOB or wheezing  No fevers, aches   She is taking singulair, zyrtec, allegra, atrovent inhaler, stiolto,   Requesting cough syrup   Please advise, thanks!  Allergies  Allergen Reactions   Sulfa Antibiotics Shortness Of Breath, Swelling and Rash   Clindamycin/Lincomycin    Dilaudid [Hydromorphone Hcl]     Makes me crazy   Iron     Throat starts closing up, tongue swells, severe headaches and backpain   Metronidazole Diarrhea and Nausea And Vomiting   Prednisone Other (See Comments)    Angry     Statins Other (See Comments)    Unknown    Cephalexin Diarrhea and Nausea And Vomiting   Methotrexate Derivatives Swelling and Rash   Morphine And Codeine Anxiety    "exreme mood swings"

## 2023-02-22 NOTE — Telephone Encounter (Signed)
She is sensitive to codeine, but I can send hycodan for her if she would like to try it

## 2023-02-22 NOTE — Addendum Note (Signed)
Addended by: Leslye Peer on: 02/22/2023 12:22 PM   Modules accepted: Orders

## 2023-02-23 ENCOUNTER — Other Ambulatory Visit: Payer: Self-pay | Admitting: Internal Medicine

## 2023-02-23 DIAGNOSIS — E1149 Type 2 diabetes mellitus with other diabetic neurological complication: Secondary | ICD-10-CM

## 2023-02-24 DIAGNOSIS — Z79891 Long term (current) use of opiate analgesic: Secondary | ICD-10-CM | POA: Diagnosis not present

## 2023-02-24 DIAGNOSIS — M797 Fibromyalgia: Secondary | ICD-10-CM | POA: Diagnosis not present

## 2023-02-24 DIAGNOSIS — G894 Chronic pain syndrome: Secondary | ICD-10-CM | POA: Diagnosis not present

## 2023-02-24 DIAGNOSIS — M47816 Spondylosis without myelopathy or radiculopathy, lumbar region: Secondary | ICD-10-CM | POA: Diagnosis not present

## 2023-02-28 ENCOUNTER — Telehealth: Payer: Self-pay | Admitting: Emergency Medicine

## 2023-02-28 NOTE — Telephone Encounter (Signed)
Pt calling to get a refill for her Cough syrup.  Pharmacy: [facility] in [place]

## 2023-03-01 ENCOUNTER — Ambulatory Visit (HOSPITAL_COMMUNITY): Payer: 59

## 2023-03-01 ENCOUNTER — Other Ambulatory Visit (HOSPITAL_COMMUNITY): Payer: 59

## 2023-03-01 NOTE — Telephone Encounter (Signed)
Per chart Hycodan was sent to pharmacy 02/22/23.  E-Prescribing Status: Receipt confirmed by pharmacy (02/22/2023 12:22 PM EDT)   LM to advise patient to check with pharmacy re: rx. Also advised if she has used all of this rx she will need to contact the office for further recommendation.

## 2023-03-02 ENCOUNTER — Telehealth: Payer: Self-pay | Admitting: Emergency Medicine

## 2023-03-02 ENCOUNTER — Other Ambulatory Visit: Payer: Self-pay | Admitting: Emergency Medicine

## 2023-03-02 MED ORDER — HYDROCODONE BIT-HOMATROP MBR 5-1.5 MG/5ML PO SOLN: 5.0000 mL | Freq: Four times a day (QID) | ORAL | 0 refills | Status: DC | PRN

## 2023-03-02 NOTE — Telephone Encounter (Signed)
Please let her know I refilled it  

## 2023-03-02 NOTE — Telephone Encounter (Signed)
Let patient know her script has been sent to pharmacy of choice per Dr.Byrum. Patient's voice was understanding and nothing else further needed.

## 2023-03-02 NOTE — Telephone Encounter (Signed)
Called and spoke with patient. Patient is requesting a refill of Hycodan to [facility].  Patient stated she is not completely out, but it has helped.  Patient stated she is taking Hycodan at bedtime and is able to sleep. Patient stated her cough is nonproductive and worse at night.  Patient also wanted Dr. Delton Coombes to know that she is using Stiolto before bedtime and thinks that has helped. Patient also stated she has only used her Atrovent HFA 1 time for wheezing.    Message routed to Dr.Byrum to advise on Hycodan refill

## 2023-03-02 NOTE — Telephone Encounter (Signed)
Please see last signed tel encounter. PT is calling again for more cough syrup.I read the note from this encounter to her which states the following...   Also advised if she has used all of this rx she will need to contact the office for further recommendation.   Her reply was "OK, that's what I am doing."  Please call to advise @ [phone]

## 2023-03-03 ENCOUNTER — Ambulatory Visit (HOSPITAL_COMMUNITY): Payer: 59

## 2023-03-03 ENCOUNTER — Other Ambulatory Visit: Payer: Self-pay | Admitting: Internal Medicine

## 2023-03-03 ENCOUNTER — Other Ambulatory Visit (HOSPITAL_COMMUNITY): Payer: 59

## 2023-03-03 ENCOUNTER — Inpatient Hospital Stay: Payer: 59

## 2023-03-07 ENCOUNTER — Ambulatory Visit (HOSPITAL_COMMUNITY): Payer: 59

## 2023-03-08 ENCOUNTER — Ambulatory Visit (HOSPITAL_COMMUNITY): Payer: 59

## 2023-03-08 ENCOUNTER — Other Ambulatory Visit (HOSPITAL_COMMUNITY): Payer: 59

## 2023-03-09 ENCOUNTER — Inpatient Hospital Stay: Payer: 59 | Admitting: Physician Assistant

## 2023-03-10 ENCOUNTER — Ambulatory Visit (HOSPITAL_COMMUNITY): Payer: 59

## 2023-03-10 ENCOUNTER — Other Ambulatory Visit (HOSPITAL_COMMUNITY): Payer: 59

## 2023-03-10 ENCOUNTER — Other Ambulatory Visit: Payer: 59

## 2023-03-10 ENCOUNTER — Inpatient Hospital Stay: Payer: 59

## 2023-03-12 ENCOUNTER — Other Ambulatory Visit: Payer: Self-pay | Admitting: Internal Medicine

## 2023-03-12 DIAGNOSIS — M5030 Other cervical disc degeneration, unspecified cervical region: Secondary | ICD-10-CM

## 2023-03-13 ENCOUNTER — Ambulatory Visit (HOSPITAL_COMMUNITY): Payer: 59

## 2023-03-13 ENCOUNTER — Inpatient Hospital Stay: Payer: 59 | Attending: Hematology

## 2023-03-13 DIAGNOSIS — D509 Iron deficiency anemia, unspecified: Secondary | ICD-10-CM | POA: Diagnosis not present

## 2023-03-13 LAB — CBC WITH DIFFERENTIAL/PLATELET
Abs Immature Granulocytes: 0.03 10*3/uL (ref 0.00–0.07)
Basophils Absolute: 0.1 10*3/uL (ref 0.0–0.1)
Basophils Relative: 1 %
Eosinophils Absolute: 0.5 10*3/uL (ref 0.0–0.5)
Eosinophils Relative: 7 %
HCT: 37 % (ref 36.0–46.0)
Hemoglobin: 11 g/dL — ABNORMAL LOW (ref 12.0–15.0)
Immature Granulocytes: 0 %
Lymphocytes Relative: 28 %
Lymphs Abs: 2.2 10*3/uL (ref 0.7–4.0)
MCH: 25.3 pg — ABNORMAL LOW (ref 26.0–34.0)
MCHC: 29.7 g/dL — ABNORMAL LOW (ref 30.0–36.0)
MCV: 85.3 fL (ref 80.0–100.0)
Monocytes Absolute: 0.7 10*3/uL (ref 0.1–1.0)
Monocytes Relative: 9 %
Neutro Abs: 4.2 10*3/uL (ref 1.7–7.7)
Neutrophils Relative %: 55 %
Platelets: 508 10*3/uL — ABNORMAL HIGH (ref 150–400)
RBC: 4.34 MIL/uL (ref 3.87–5.11)
RDW: 14.3 % (ref 11.5–15.5)
WBC: 7.6 10*3/uL (ref 4.0–10.5)
nRBC: 0 % (ref 0.0–0.2)

## 2023-03-13 LAB — FERRITIN: Ferritin: 5 ng/mL — ABNORMAL LOW (ref 11–307)

## 2023-03-13 LAB — IRON AND TIBC
Iron: 42 ug/dL (ref 28–170)
Saturation Ratios: 5 % — ABNORMAL LOW (ref 10.4–31.8)
TIBC: 821 ug/dL — ABNORMAL HIGH (ref 250–450)
UIBC: 779 ug/dL

## 2023-03-14 ENCOUNTER — Other Ambulatory Visit: Payer: Self-pay | Admitting: Internal Medicine

## 2023-03-14 ENCOUNTER — Other Ambulatory Visit: Payer: Self-pay | Admitting: Emergency Medicine

## 2023-03-14 ENCOUNTER — Encounter (HOSPITAL_COMMUNITY): Payer: Self-pay | Admitting: Hematology

## 2023-03-14 ENCOUNTER — Telehealth: Payer: Self-pay | Admitting: *Deleted

## 2023-03-14 ENCOUNTER — Other Ambulatory Visit (HOSPITAL_COMMUNITY): Payer: Self-pay | Admitting: Oncology

## 2023-03-14 DIAGNOSIS — R42 Dizziness and giddiness: Secondary | ICD-10-CM

## 2023-03-14 MED ORDER — OMEPRAZOLE 40 MG PO CPDR: 40.0000 mg | DELAYED_RELEASE_CAPSULE | Freq: Two times a day (BID) | ORAL | 0 refills | Status: DC

## 2023-03-14 MED ORDER — ACYCLOVIR 400 MG PO TABS: 400.0000 mg | ORAL_TABLET | Freq: Three times a day (TID) | ORAL | 0 refills | Status: DC

## 2023-03-14 NOTE — Progress Notes (Signed)
Please give premeds prior to IV Venofer.  Apparently had a reaction with IV Feraheme after she left and went home.  Had severe nausea, vomiting and abdominal pain.  Recommend switching IV iron from Feraheme to 300 mg IV Venofer x 3 doses.  Durenda Hurt, NP 03/14/2023 9:22 AM

## 2023-03-14 NOTE — Telephone Encounter (Signed)
Received call from patient concerned about her iron studies.  Labs reviewed by Durenda Hurt, NP and she recommends Venofer 300 mg x 3 doses.  Patient will be notified.

## 2023-03-14 NOTE — Telephone Encounter (Signed)
Sorry I can't refill it this frequently.  Can we use other meds  > ? Tessalon 200mg  q6h

## 2023-03-14 NOTE — Telephone Encounter (Signed)
Called and spoke with patient.  Patient is requesting a refill for Hycodan to be sent to [facility].  Patient stated she is not completely out, but close.  Patient stated she does feel improved and cough has become productive.  Patient stated cough is mostly when she lays down.   Hycodan last refill 03/02/23 -   Message routed to [facility]

## 2023-03-15 ENCOUNTER — Ambulatory Visit (HOSPITAL_COMMUNITY)
Admission: RE | Admit: 2023-03-15 | Discharge: 2023-03-15 | Disposition: A | Discharge status: Home / Self Care | Payer: 59 | Source: Ambulatory Visit | Attending: Cardiology | Admitting: Cardiology

## 2023-03-15 ENCOUNTER — Telehealth: Payer: Self-pay | Admitting: Internal Medicine

## 2023-03-15 DIAGNOSIS — I779 Disorder of arteries and arterioles, unspecified: Secondary | ICD-10-CM | POA: Insufficient documentation

## 2023-03-15 DIAGNOSIS — I6523 Occlusion and stenosis of bilateral carotid arteries: Secondary | ICD-10-CM | POA: Diagnosis not present

## 2023-03-15 NOTE — Telephone Encounter (Signed)
Patient called need med refills acyclovir (ZOVIRAX) 400 MG tablet [[id number]]   Pharmacy: [facility] [address] [place]    Update pharmacy for all medicines to [facility] [place]

## 2023-03-15 NOTE — Telephone Encounter (Signed)
Refills sent on 03/14/2023.

## 2023-03-16 MED ORDER — BENZONATATE 200 MG PO CAPS: 200.0000 mg | ORAL_CAPSULE | Freq: Four times a day (QID) | ORAL | 1 refills | Status: DC | PRN

## 2023-03-16 MED FILL — Iron Sucrose Inj 20 MG/ML (Fe Equiv): INTRAVENOUS | Qty: 15 | Status: AC

## 2023-03-16 NOTE — Telephone Encounter (Signed)
Called and spoke with patient.  Patient stated she would try Tessalon 200mg  for cough.  Patient stated she is having a productive cough with green to brownish colored sputum. Patient is requesting a antibiotic. Tessalon prescription has been sent to pharmacy.   Message routed to Dr. Delton Coombes to advise on antibiotic

## 2023-03-17 ENCOUNTER — Other Ambulatory Visit: Payer: Self-pay | Admitting: Internal Medicine

## 2023-03-17 ENCOUNTER — Inpatient Hospital Stay: Payer: 59

## 2023-03-17 DIAGNOSIS — E1149 Type 2 diabetes mellitus with other diabetic neurological complication: Secondary | ICD-10-CM

## 2023-03-18 ENCOUNTER — Other Ambulatory Visit: Payer: Self-pay | Admitting: Internal Medicine

## 2023-03-18 DIAGNOSIS — E1149 Type 2 diabetes mellitus with other diabetic neurological complication: Secondary | ICD-10-CM

## 2023-03-19 NOTE — Progress Notes (Unsigned)
[facility] [address], [place] [address]   CLINIC:  Medical Oncology/Hematology  PCP:  Anabel Halon, MD [address] [place] [address] [phone]  REASON FOR VISIT:  Follow-up for iron deficiency anemia   CURRENT THERAPY: IV iron infusions  INTERVAL HISTORY:   Kayla Ryan 67 y.o. female returns for routine follow-up of iron deficiency anemia, complicated by poor compliance with treatment plan.  She was last seen by Rojelio Brenner PA-C on 09/26/2022.  After her last visit, she was recommended for additional IV iron, but unfortunately has not been able to complete this due to issues with chronic diarrhea and anxiety (reports history of previous trauma that she is afraid to leave her house).  Review of previous appointments shows a pattern of cancellations -since her visit in February 2024, she canceled and rescheduled her IV iron on 15 separate occasions, with most recent cancellation being this past Friday, 03/17/2023.  Additionally, several of these cancellations took place within the 24-hour window prior to appointment.  She has not yet completed any doses of the IV iron that was ordered in February.    She previously had issues with frequent melanotic ("coffee-ground diarrhea") stool, but reports that this is improved and she is now noticing it only once or twice a month.  She denies any rectal bleeding.  She reports pica, headaches, and lightheadedness.  Other symptoms as reported in ROS below.  She has little to no energy and 90% appetite. She endorses that she is maintaining a stable weight.  ASSESSMENT & PLAN:  1.  Normocytic anemia: - Etiology chronic GI blood loss, CKD stage IIIb, and iron deficiency.  - Hospitalized in November 2022 with Hgb 5.8, s/p 4 units PRBC - Most recent blood transfusion was in January 2023. - Colonoscopy and EGD (04/30/2021) showed no obvious source of blood loss. - Capsule study (09/22/2021): Few gastric erosions,  nonbleeding.  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 - She cannot take oral iron due to allergies (tongue swells, rash, loin pain).  High intolerance of IV iron with significant side effects (see below). - Most recent IV iron with Feraheme 600 mg in December 2023 - Apart from the side effects of the iron itself, she reports that she felt improved energy after her IV iron. - She has black tarry stools once or twice a month - Most recent labs (03/13/2023): Hgb 11.0/MCV 85.3, ferritin 5, iron saturation 5% with TIBC 821.  Thrombocytosis noted with platelets 508, likely reactive to iron deficiency. - PLAN: Recommend IV iron with Feraheme x 2 with IV Pepcid, IV cetirizine, and Tylenol. - Prescription sent for ODT Zofran.  She has dicyclomine at home as prescribed by her PCP. - Labs and RTC in 3 months - Patient advised to continue close follow-up with GI   2.  Intolerance of IV iron infusions  - Reports previous reactions to iron infusions with Venofer (severe nausea, rash, itching, myalgias). She has received IV Venofer 100 mg every 5 days in [facility] under the direction of Dr. Ottis Stain.  (Per notes by Dr. Ottis Stain from 2020, patient was receiving monthly Venofer as maintenance.)  She is also unable to tolerate steroid premedications due to agitation and mood changes. - Patient experienced severe low back pain, nausea, vomiting, and diarrhea after IV Venofer received in July/August 2023 - Trial of Feraheme in November/December 2023, due to more favorable adverse event profile Low-dose Feraheme (90 mg) on 06/15/2022, and reported diarrhea  and nausea for the next several days.   Full dose Feraheme (510 mg) on 08/04/2022, and called the next day to report severe diarrhea and rash.   - Premedication including IV Pepcid, IV cetirizine, and Tylenol. - Also requires antiemetic and anxiolytic prior to infusions Zofran and dicyclomine improved her nausea and diarrhea after iron infusions. - Patient  requests to avoid IV Solu-Medrol as premedication due to intolerance of steroids with side effects of mood swings and aggression - Note that Solu-Medrol could still be used as rescue medication - PLAN: Discussed with the patient that we could try Ferrlecit to see if this was better tolerated, but she would like to try Feraheme again.  Reports that even though she had side effects that she tolerated better than any infusion that she had in the past.  We will continue premedication with IV Pepcid, IV cetirizine, and Tylenol.   3.  Noncompliance  - Patient has had frequent cancellations of her IV iron, as noted in HPI - Patient cites reason for cancellations as being severe chronic diarrhea related to her IBS, somewhat relieved with Bentyl and Imodium, following with GI.  Diarrhea tends to be better in the afternoons, therefore she prefers afternoon appointments for IV iron. 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 - PLAN: Discussed at length with patient regarding strategies we can employ to improve her ability to follow through on prescribed treatment regimen.  She understands that continued cancellations could result in dismissal from clinic.  She is very apologetic and "would like to do better." - Patient given information on local therapists who may be able to assist with her anxiety and processing her previous trauma - If ongoing issues, we will get social worker involved for further assessment of barriers to care  4. Other history - PMH: CKD stage IIIb.  COPD with chronic hypoxic respiratory failure (on 3 L supplemental oxygen continuously), dermatomyositis/muscular dystrophy, GERD, history of Crohn's disease but with recent EGD/colonoscopy negative for any signs of Crohn's. - She is widowed.  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  Quit smoking 20+ years ago. - Mother had  lung cancer and father had lung cancer.  Niece had melanoma.   PLAN SUMMARY: >> IV Feraheme 510 mg x 2 >> Labs (CBC/D, ferritin, iron/TIBC) in 3 months >> PHONE visit in 3 months (after labs)  ** Last office visit 03/20/23      REVIEW OF SYSTEMS:   Review of Systems  Constitutional:  Positive for fatigue. Negative for appetite change, chills, diaphoresis, fever and unexpected weight change.  HENT:   Positive for trouble swallowing. Negative for lump/mass and nosebleeds.   Eyes:  Negative for eye problems.  Respiratory:  Positive for cough and shortness of breath. Negative for hemoptysis.   Cardiovascular:  Positive for chest pain and palpitations. Negative for leg swelling.  Gastrointestinal:  Positive for diarrhea (Chronic, severe diarrhea related to IBS, with Crohn's disease in remission), nausea and vomiting. Negative for abdominal pain, blood in stool and constipation.  Genitourinary:  Positive for difficulty urinating. Negative for hematuria.   Skin: Negative.   Neurological:  Positive for extremity weakness (Muscular dystrophy) and numbness. Negative for dizziness, headaches and light-headedness.  Hematological:  Does not bruise/bleed easily.  Psychiatric/Behavioral:  Positive for sleep disturbance. The patient is nervous/anxious.      PHYSICAL EXAM:  ECOG PERFORMANCE STATUS: 2 - Symptomatic, <50% confined to  bed  There were no vitals filed for this visit. There were no vitals filed for this visit. Physical Exam Constitutional:      Appearance: Normal appearance. She is normal weight.  Cardiovascular:     Heart sounds: Normal heart sounds.  Pulmonary:     Breath sounds: Normal breath sounds.     Comments: 2 L oxygen via nasal cannula in place with portable oxygen concentrator Skin:    Coloration: Skin is pale.  Neurological:     General: No focal deficit present.     Mental Status: Mental status is at baseline.  Psychiatric:        Behavior: Behavior normal. Behavior  is cooperative.     PAST MEDICAL/SURGICAL HISTORY:  Past Medical History:  Diagnosis Date   Allergy Unknown   Anemia    Anxiety 1883   Arthritis    Asthma    Chronic bronchitis    Crohn disease (HCC)    Emphysema    Fibromyalgia    GERD (gastroesophageal reflux disease)    GI bleed    Herpes    History of blood transfusion    Hyperlipidemia    Hypertension 2015   IBS (irritable bowel syndrome)    Kidney failure    Migraines    Muscular dystrophy (HCC)    Neck pain    PAF (paroxysmal atrial fibrillation) (HCC)    Not anticoagulated with history of severe GI bleeding   Plantar fasciitis    Polymyositis (HCC)    PONV (postoperative nausea and vomiting)    Right thyroid nodule 04/07/2022   Scoliosis    Type 2 diabetes mellitus (HCC)    Past Surgical History:  Procedure Laterality Date   ABDOMINAL HYSTERECTOMY     AGILE CAPSULE N/A 06/24/2021   Procedure: AGILE CAPSULE;  Surgeon: Lanelle Bal, DO;  [facility];  Service: Endoscopy;  Laterality: N/A;   AGILE CAPSULE N/A 07/22/2021   Procedure: AGILE CAPSULE;  Surgeon: Corbin Ade, MD;  [facility]: [facility];  Service: Endoscopy;  Laterality: N/A;  7:30am   bone spur     BREAST SURGERY  1995   CHOLECYSTECTOMY     COLONOSCOPY WITH PROPOFOL N/A 04/30/2021   Procedure: COLONOSCOPY WITH PROPOFOL;  Surgeon: Corbin Ade, MD;  [facility]: [facility];  Service: Endoscopy;  Laterality: N/A;   ESOPHAGEAL DILATION N/A 04/30/2021   Procedure: ESOPHAGEAL DILATION;  Surgeon: Corbin Ade, MD;  [facility]: [facility];  Service: Endoscopy;  Laterality: N/A;   ESOPHAGOGASTRODUODENOSCOPY (EGD) WITH PROPOFOL N/A 04/30/2021   Procedure: ESOPHAGOGASTRODUODENOSCOPY (EGD) WITH PROPOFOL;  Surgeon: Corbin Ade, MD;  [facility]: [facility];  Service: Endoscopy;  Laterality: N/A;   GIVENS CAPSULE STUDY N/A 09/22/2021   Procedure: GIVENS CAPSULE STUDY;  Surgeon: Corbin Ade, MD;  [facility];   Service: Endoscopy;  Laterality: N/A;  7:30am   HERNIA REPAIR     LEFT HEART CATHETERIZATION WITH CORONARY ANGIOGRAM N/A 11/07/2013   Procedure: LEFT HEART CATHETERIZATION WITH CORONARY ANGIOGRAM;  Surgeon: Runell Gess, MD;  Location: [facility] CATH LAB;  Service: Cardiovascular;  Laterality: N/A;   MASTECTOMY PARTIAL / LUMPECTOMY     OOPHORECTOMY     ROTATOR CUFF REPAIR     TOOTH EXTRACTION N/A 04/15/2022   Procedure: DENTAL RESTORATION/EXTRACTIONS;  Surgeon: Ocie Doyne, DMD;  Location: [facility] OR;  Service: Oral Surgery;  Laterality: N/A;    SOCIAL HISTORY:  Social History   Socioeconomic History   Marital  status: Widowed    Spouse name: Not on file   Number of children: Not on file   Years of education: Not on file   Highest education level: Associate degree: occupational, Scientist, product/process development, or vocational program  Occupational History   Not on file  Tobacco Use   Smoking status: Former    Current packs/day: 0.00    Average packs/day: 1 pack/day for 49.0 years (49.0 ttl pk-yrs)    Types: Cigarettes    Start date: 08/08/1966    Quit date: 08/31/2005    Years since quitting: 17.5   Smokeless tobacco: Never  Vaping Use   Vaping status: Never Used  Substance and Sexual Activity   Alcohol use: No   Drug use: No    Comment: used marijuana in teens   Sexual activity: Not Currently    Birth control/protection: Surgical  Other Topics Concern   Not on file  Social History Narrative   Not on file   Social Determinants of Health   Financial Resource Strain: Low Risk  (10/28/2022)   Overall Financial Resource Strain (CARDIA)    Difficulty of Paying Living Expenses: Not very hard  Food Insecurity: Food Insecurity Present (10/28/2022)   Hunger Vital Sign    Worried About Running Out of Food in the Last Year: Sometimes true    Ran Out of Food in the Last Year: Sometimes true  Transportation Needs: No Transportation Needs (10/28/2022)   PRAPARE - Administrator, Civil Service  (Medical): No    Lack of Transportation (Non-Medical): No  Physical Activity: Insufficiently Active (10/28/2022)   Exercise Vital Sign    Days of Exercise per Week: 1 day    Minutes of Exercise per Session: 10 min  Stress: Stress Concern Present (10/28/2022)   [employer] of Occupational Health - Occupational Stress Questionnaire    Feeling of Stress : To some extent  Social Connections: Unknown (10/28/2022)   Social Connection and Isolation Panel [NHANES]    Frequency of Communication with Friends and Family: Twice a week    Frequency of Social Gatherings with Friends and Family: Never    Attends Religious Services: Patient declined    Database administrator or Organizations: Patient declined    Attends Banker Meetings: Not on file    Marital Status: Widowed  Intimate Partner Violence: Not At Risk (03/14/2022)   Humiliation, Afraid, Rape, and Kick questionnaire    Fear of Current or Ex-Partner: No    Emotionally Abused: No    Physically Abused: No    Sexually Abused: No    FAMILY HISTORY:  Family History  Problem Relation Age of Onset   Lung cancer Mother    Cancer Mother    Miscarriages / Stillbirths Mother    Varicose Veins Mother    COPD Father    Lung cancer Father    Lymphoma Father    Alcohol abuse Father    Arthritis Father    Anxiety disorder Sister    Depression Brother    Anxiety disorder Sister     CURRENT MEDICATIONS:  Outpatient Encounter Medications as of 03/20/2023  Medication Sig   ACCU-CHEK GUIDE test strip USE TO CHECK BLOOD GLUCOSE THREE TIMES DAILY, MORNING, AT NOON, AND AT BEDTIME   Accu-Chek Softclix Lancets lancets USE TO TEST BLOOD SUGAR EVERY MORNING, AT NOON AND EVERY NIGHT AT BEDTIME   acyclovir (ZOVIRAX) 400 MG tablet Take 1 tablet (400 mg total) by mouth 3 (three) times daily.   benzonatate (  TESSALON) 200 MG capsule Take 1 capsule (200 mg total) by mouth every 6 (six) hours as needed for cough.   cetirizine (ZYRTEC) 10 MG  tablet Take 10 mg by mouth 2 (two) times daily.   Continuous Glucose Receiver (DEXCOM G7 RECEIVER) DEVI USE TO CHECK BLOOD GLUCOSE AS NEEDED   dicyclomine (BENTYL) 10 MG capsule Take 1 capsule (10 mg total) by mouth 4 (four) times daily -  before meals and at bedtime. Monitor for constipation, dry mouth, dizziness (Patient taking differently: Take 10 mg by mouth 4 (four) times daily as needed (Bowel Spasm/Diarrhea). Monitor for constipation, dry mouth, dizziness)   diphenhydrAMINE (BENADRYL) 25 MG tablet Take 25 mg by mouth every 4 (four) hours as needed for allergies or itching.   doxepin (SINEQUAN) 10 MG capsule TAKE 1 CAPSULE(10 MG) BY MOUTH AT BEDTIME   DULoxetine (CYMBALTA) 60 MG capsule Take 1 capsule (60 mg total) by mouth 2 (two) times daily. (Patient taking differently: Take 60 mg by mouth every evening.)   ezetimibe (ZETIA) 10 MG tablet Take 1 tablet (10 mg total) by mouth daily.   gabapentin (NEURONTIN) 800 MG tablet TAKE 1 TABLET(800 MG) BY MOUTH THREE TIMES DAILY   gemfibrozil (LOPID) 600 MG tablet Take 1 tablet (600 mg total) by mouth 2 (two) times daily before a meal.   Glucagon (GVOKE HYPOPEN 2-PACK) 1 MG/0.2ML SOAJ Inject 0.2 mLs into the skin as needed (Blood glucose < 53).   Glycerin-Hypromellose-PEG 400 (DRY EYE RELIEF DROPS OP) Place 1 drop into both eyes 3 (three) times daily as needed (Dry eye).   HYDROcodone bit-homatropine (HYCODAN) 5-1.5 MG/5ML syrup Take 5 mLs by mouth every 6 (six) hours as needed for cough.   HYDROcodone-acetaminophen (NORCO) 10-325 MG tablet Take 1 tablet by mouth every 4 (four) hours as needed. Stop Hydrocodone 7.5/325mg  tablet.   ipratropium (ATROVENT HFA) 17 MCG/ACT inhaler Inhale 2 puffs into the lungs every 4 (four) hours as needed (COPD).   isosorbide dinitrate (ISORDIL) 30 MG tablet TAKE 1 TABLET(30 MG) BY MOUTH DAILY   JARDIANCE 10 MG TABS tablet TAKE 1 TABLET(10 MG) BY MOUTH DAILY BEFORE BREAKFAST   Lancets Misc. (ACCU-CHEK SOFTCLIX LANCET DEV)  KIT USE TO CHECK GLUCOSE THREE TIMES DAILY   LORazepam (ATIVAN) 0.5 MG tablet TAKE 1 TABLET(0.5 MG) BY MOUTH THREE TIMES DAILY AS NEEDED FOR ANXIETY   meclizine (ANTIVERT) 25 MG tablet TAKE 1 TABLET(25 MG) BY MOUTH THREE TIMES DAILY AS NEEDED FOR DIZZINESS   metFORMIN (GLUCOPHAGE) 500 MG tablet TAKE 1 TABLET(500 MG) BY MOUTH TWICE DAILY WITH A MEAL   methocarbamol (ROBAXIN) 500 MG tablet TAKE 1 TABLET(500 MG) BY MOUTH EVERY 8 HOURS AS NEEDED FOR MUSCLE SPASMS   Misc. Devices KIT Henmnii rollator walker   montelukast (SINGULAIR) 10 MG tablet Take 10 mg by mouth at bedtime.   MOUNJARO 5 MG/0.5ML Pen Inject into the skin.   NITROSTAT 0.4 MG SL tablet Place 0.4 mg under the tongue every 15 (fifteen) minutes as needed for chest pain.    omeprazole (PRILOSEC) 40 MG capsule Take 1 capsule (40 mg total) by mouth 2 (two) times daily.   ondansetron (ZOFRAN-ODT) 8 MG disintegrating tablet DISSOLVE 1 TABLET(8 MG) ON THE TONGUE EVERY 8 HOURS AS NEEDED FOR NAUSEA OR VOMITING   simethicone (GAS-X) 80 MG chewable tablet Chew 1 tablet (80 mg total) by mouth every 6 (six) hours as needed for flatulence.   Tiotropium Bromide-Olodaterol (STIOLTO RESPIMAT) 2.5-2.5 MCG/ACT AERS Inhale 2 each into the lungs daily.   [  DISCONTINUED] Continuous Blood Gluc Sensor (DEXCOM G7 SENSOR) MISC USE TO CHECK BLOOD SUGAR THREE TIMES DAILY BEFORE MEALS AND AT BEDTIME AND AS NEEDED. CHANGE SENSOR EVERY 10 DAYS   [DISCONTINUED] tirzepatide (MOUNJARO) 2.5 MG/0.5ML Pen Inject 2.5 mg into the skin once a week.   No facility-administered encounter medications on file as of 03/20/2023.    ALLERGIES:  Allergies  Allergen Reactions   Sulfa Antibiotics Shortness Of Breath, Swelling and Rash   Clindamycin/Lincomycin    Dilaudid [Hydromorphone Hcl]     Makes me crazy   Iron     Throat starts closing up, tongue swells, severe headaches and backpain   Metronidazole Diarrhea and Nausea And Vomiting   Prednisone Other (See Comments)     Angry     Statins Other (See Comments)    Unknown    Cephalexin Diarrhea and Nausea And Vomiting   Methotrexate Derivatives Swelling and Rash   Morphine And Codeine Anxiety    "exreme mood swings"    LABORATORY DATA:  I have reviewed the labs as listed.  CBC    Component Value Date/Time   WBC 7.6 03/13/2023 1228   RBC 4.34 03/13/2023 1228   HGB 11.0 (L) 03/13/2023 1228   HGB 12.2 12/08/2022 1547   HCT 37.0 03/13/2023 1228   HCT 37.7 12/08/2022 1547   PLT 508 (H) 03/13/2023 1228   PLT 474 (H) 12/08/2022 1547   MCV 85.3 03/13/2023 1228   MCV 91 12/08/2022 1547   MCH 25.3 (L) 03/13/2023 1228   MCHC 29.7 (L) 03/13/2023 1228   RDW 14.3 03/13/2023 1228   RDW 12.2 12/08/2022 1547   LYMPHSABS 2.2 03/13/2023 1228   LYMPHSABS 2.1 12/08/2022 1547   MONOABS 0.7 03/13/2023 1228   EOSABS 0.5 03/13/2023 1228   EOSABS 0.6 (H) 12/08/2022 1547   BASOSABS 0.1 03/13/2023 1228   BASOSABS 0.1 12/08/2022 1547      Latest Ref Rng & Units 12/08/2022    3:47 PM 09/01/2022    3:51 PM 06/03/2022   10:54 AM  CMP  Glucose 70 - 99 mg/dL 85  82  83   BUN 8 - 27 mg/dL 39  22  18   Creatinine 0.57 - 1.00 mg/dL 6.60  6.30  1.60   Sodium 134 - 144 mmol/L 140  139  137   Potassium 3.5 - 5.2 mmol/L 4.7  4.6  5.3   Chloride 96 - 106 mmol/L 97  99  95   CO2 20 - 29 mmol/L 23  25  24    Calcium 8.7 - 10.3 mg/dL 10.9  32.3  55.7   Total Protein 6.0 - 8.5 g/dL 7.6  7.9  7.5   Total Bilirubin 0.0 - 1.2 mg/dL 0.2  0.2  0.2   Alkaline Phos 44 - 121 IU/L 73  59  111   AST 0 - 40 IU/L 26  33  35   ALT 0 - 32 IU/L 31  35  49     DIAGNOSTIC IMAGING:  I have independently reviewed the relevant imaging and discussed with the patient.   WRAP UP:  All questions were answered. The patient knows to call the clinic with any problems, questions or concerns.  Medical decision making: Moderate  Time spent on visit: I spent 20 minutes counseling the patient face to face. The total time spent in the appointment  was 30 minutes and more than 50% was on counseling.  Carnella Guadalajara, PA-C  03/20/23 2:08 PM

## 2023-03-20 ENCOUNTER — Telehealth: Payer: Self-pay

## 2023-03-20 ENCOUNTER — Telehealth: Payer: Self-pay | Admitting: Internal Medicine

## 2023-03-20 ENCOUNTER — Inpatient Hospital Stay: Payer: 59 | Admitting: Physician Assistant

## 2023-03-20 DIAGNOSIS — D500 Iron deficiency anemia secondary to blood loss (chronic): Secondary | ICD-10-CM

## 2023-03-20 DIAGNOSIS — I779 Disorder of arteries and arterioles, unspecified: Secondary | ICD-10-CM

## 2023-03-20 DIAGNOSIS — D509 Iron deficiency anemia, unspecified: Secondary | ICD-10-CM | POA: Diagnosis not present

## 2023-03-20 DIAGNOSIS — Z91199 Patient's noncompliance with other medical treatment and regimen due to unspecified reason: Secondary | ICD-10-CM | POA: Diagnosis not present

## 2023-03-20 NOTE — Patient Instructions (Signed)
[facility] **VISIT SUMMARY & IMPORTANT INSTRUCTIONS **   You were seen today by Rojelio Brenner PA-C for your follow-up visit.    IRON DEFICIENCY ANEMIA Your blood levels are mildly low ("anemia"). Your iron levels are severely low. Will schedule you for IV iron (Feraheme) x 2 doses.  We will give premedication to reduce your risk of side effects.  ANXIETY & LIFESTYLE COACHING I strongly recommend finding a counselor or therapist. [facility] in [place] provides counseling for women as well as financial assistance for those to meet requirements. Please visit their website [email or web address] call them at [phone] to see if they would be able to assist you.  ** PLEASE NOTE that continued cancellations and missed appointments could result in being dismissed from our clinic.  It is EXTREMELY important that you do not miss ANY other appointments going forward.  FOLLOW-UP APPOINTMENT: Labs and phone visit in 3 months.  ** Thank you for trusting me with your healthcare!  I strive to provide all of my patients with quality care at each visit.  If you receive a survey for this visit, I would be so grateful to you for taking the time to provide feedback.  Thank you in advance!  ~                    Dr. Doreatha Massed   &   Rojelio Brenner, PA-C   - - - - - - - - - - - - - - - - - -    Thank you for choosing [facility] to provide your oncology and hematology care.  To afford each patient quality time with our provider, please arrive at least 15 minutes before your scheduled appointment time.   If you have a lab appointment with the Cancer Center please come in thru the Main Entrance and check in at the main information desk.  You need to re-schedule your appointment should you arrive 10 or more minutes late.  We strive to give you quality time with our providers, and arriving late  affects you and other patients whose appointments are after yours.  Also, if you no show three or more times for appointments you may be dismissed from the clinic at the providers discretion.     Again, thank you for choosing [facility].  Our hope is that these requests will decrease the amount of time that you wait before being seen by our physicians.       _____________________________________________________________  Should you have questions after your visit to [facility], please contact our office at [phone] and follow the prompts.  Our office hours are 8:00 a.m. and 4:30 p.m. Monday - Friday.  Please note that voicemails left after 4:00 p.m. may not be returned until the following business day.  We are closed weekends and major holidays.  You do have access to a nurse 24-7, just call the main number to the clinic [phone] and do not press any options, hold on the line and a nurse will answer the phone.    For prescription refill requests, have your pharmacy contact our office and allow 72 hours.

## 2023-03-20 NOTE — Telephone Encounter (Signed)
Patient is calling says her Kayla Ryan was denied and is not sure why- she will need it Saturday Please advise, thank you

## 2023-03-20 NOTE — Telephone Encounter (Signed)
-----   Message from Nona Dell sent at 03/18/2023 12:15 PM EDT ----- Results reviewed.  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  I would suggest that we get follow-up carotid Dopplers through our vascular lab in 6 months and otherwise continue medical therapy.

## 2023-03-20 NOTE — Telephone Encounter (Signed)
Results discussed with patient,order placed for repeat carotid in March 2025 in the [facility] office.

## 2023-03-21 ENCOUNTER — Other Ambulatory Visit: Payer: Self-pay

## 2023-03-21 DIAGNOSIS — E1149 Type 2 diabetes mellitus with other diabetic neurological complication: Secondary | ICD-10-CM

## 2023-03-21 MED ORDER — TIRZEPATIDE 5 MG/0.5ML ~~LOC~~ SOAJ: 5.0000 mg | SUBCUTANEOUS | 1 refills | Status: DC

## 2023-03-21 NOTE — Telephone Encounter (Signed)
Refilled, patient advised.

## 2023-03-21 NOTE — Telephone Encounter (Signed)
Please send doxycycline 100mg  bid x 7 days

## 2023-03-22 MED ORDER — DOXYCYCLINE HYCLATE 100 MG PO TABS: 100.0000 mg | ORAL_TABLET | Freq: Two times a day (BID) | ORAL | 0 refills | Status: AC

## 2023-03-22 NOTE — Telephone Encounter (Signed)
Doxy sent  

## 2023-03-23 ENCOUNTER — Ambulatory Visit (HOSPITAL_COMMUNITY): Payer: 59

## 2023-03-23 ENCOUNTER — Inpatient Hospital Stay (HOSPITAL_COMMUNITY): Admission: RE | Admit: 2023-03-23 | Payer: 59 | Source: Ambulatory Visit

## 2023-03-23 DIAGNOSIS — J9611 Chronic respiratory failure with hypoxia: Secondary | ICD-10-CM | POA: Diagnosis not present

## 2023-03-23 DIAGNOSIS — J449 Chronic obstructive pulmonary disease, unspecified: Secondary | ICD-10-CM | POA: Diagnosis not present

## 2023-03-24 ENCOUNTER — Inpatient Hospital Stay: Payer: 59

## 2023-03-24 ENCOUNTER — Other Ambulatory Visit (HOSPITAL_COMMUNITY): Payer: Self-pay | Admitting: Oncology

## 2023-03-24 VITALS — BP 150/63 | HR 75 | Temp 97.7°F | Resp 20

## 2023-03-24 DIAGNOSIS — D509 Iron deficiency anemia, unspecified: Secondary | ICD-10-CM | POA: Diagnosis not present

## 2023-03-24 MED ORDER — FAMOTIDINE IN NACL 20-0.9 MG/50ML-% IV SOLN
20.0000 mg | Freq: Once | INTRAVENOUS | Status: AC
  Administered 2023-03-24: 20 mg via INTRAVENOUS
  Filled 2023-03-24: qty 50

## 2023-03-24 MED ORDER — ACETAMINOPHEN 325 MG PO TABS
650.0000 mg | ORAL_TABLET | Freq: Once | ORAL | Status: AC
  Administered 2023-03-24: 650 mg via ORAL
  Filled 2023-03-24: qty 2

## 2023-03-24 MED ORDER — ONDANSETRON HCL 4 MG PO TABS
8.0000 mg | ORAL_TABLET | Freq: Once | ORAL | Status: AC
  Administered 2023-03-24: 8 mg via ORAL
  Filled 2023-03-24: qty 2

## 2023-03-24 MED ORDER — SODIUM CHLORIDE 0.9 % IV SOLN
510.0000 mg | Freq: Once | INTRAVENOUS | Status: AC
  Administered 2023-03-24: 510 mg via INTRAVENOUS
  Filled 2023-03-24: qty 510

## 2023-03-24 MED ORDER — SODIUM CHLORIDE 0.9 % IV SOLN: Freq: Once | INTRAVENOUS | Status: AC

## 2023-03-24 MED ORDER — CETIRIZINE HCL 10 MG/ML IV SOLN
10.0000 mg | Freq: Once | INTRAVENOUS | Status: AC
  Administered 2023-03-24: 10 mg via INTRAVENOUS
  Filled 2023-03-24: qty 1

## 2023-03-24 NOTE — Patient Instructions (Signed)

## 2023-03-24 NOTE — Progress Notes (Signed)
Patient presents today for iron infusion.  Patient is in satisfactory condition with no new complaints voiced.  Vital signs are stable.  We will proceed with infusion per provider orders.  Peripheral IV started with good blood return pre and post infusion.

## 2023-03-24 NOTE — Progress Notes (Signed)
Patient tolerated iron infusion well.  Vital signs remained stable.  Patient left ambulatory in stable condition.

## 2023-03-30 ENCOUNTER — Other Ambulatory Visit: Payer: Self-pay

## 2023-03-30 ENCOUNTER — Ambulatory Visit (HOSPITAL_COMMUNITY)
Admission: RE | Admit: 2023-03-30 | Discharge: 2023-03-30 | Disposition: A | Discharge status: Home / Self Care | Payer: 59 | Source: Ambulatory Visit | Attending: Cardiology | Admitting: Cardiology

## 2023-03-30 DIAGNOSIS — I428 Other cardiomyopathies: Secondary | ICD-10-CM

## 2023-03-30 DIAGNOSIS — I779 Disorder of arteries and arterioles, unspecified: Secondary | ICD-10-CM

## 2023-03-30 DIAGNOSIS — I5032 Chronic diastolic (congestive) heart failure: Secondary | ICD-10-CM | POA: Diagnosis not present

## 2023-03-30 LAB — ECHOCARDIOGRAM COMPLETE
AR max vel: 2.3 cm2
AV Area VTI: 2.18 cm2
AV Area mean vel: 2.22 cm2
AV Mean grad: 7 mmHg
AV Peak grad: 11.9 mmHg
Ao pk vel: 1.73 m/s
Area-P 1/2: 2.22 cm2
S' Lateral: 2.5 cm

## 2023-03-30 MED FILL — Ferumoxytol Inj 510 MG/17ML (30 MG/ML) (Elemental Fe): INTRAVENOUS | Qty: 17 | Status: AC

## 2023-03-30 NOTE — Progress Notes (Signed)
*  PRELIMINARY RESULTS* Echocardiogram 2D Echocardiogram has been performed.  Stacey Drain 03/30/2023, 12:25 PM

## 2023-03-31 ENCOUNTER — Inpatient Hospital Stay: Payer: 59

## 2023-03-31 NOTE — Progress Notes (Signed)
Unable to get IV access after multiple attempts from different RNs.  Patient will rescedule iron infusion for a later date.

## 2023-03-31 NOTE — Patient Instructions (Addendum)

## 2023-04-02 ENCOUNTER — Other Ambulatory Visit: Payer: Self-pay | Admitting: Internal Medicine

## 2023-04-02 DIAGNOSIS — F411 Generalized anxiety disorder: Secondary | ICD-10-CM

## 2023-04-03 ENCOUNTER — Inpatient Hospital Stay: Payer: 59

## 2023-04-03 VITALS — BP 148/71 | HR 80 | Temp 97.2°F | Resp 18

## 2023-04-03 DIAGNOSIS — D509 Iron deficiency anemia, unspecified: Secondary | ICD-10-CM

## 2023-04-03 MED ORDER — SODIUM CHLORIDE 0.9 % IV SOLN
510.0000 mg | Freq: Once | INTRAVENOUS | Status: AC
  Administered 2023-04-03: 510 mg via INTRAVENOUS
  Filled 2023-04-03 (×2): qty 17

## 2023-04-03 MED ORDER — FAMOTIDINE IN NACL 20-0.9 MG/50ML-% IV SOLN
20.0000 mg | Freq: Once | INTRAVENOUS | Status: AC
  Administered 2023-04-03: 20 mg via INTRAVENOUS
  Filled 2023-04-03: qty 50

## 2023-04-03 MED ORDER — ONDANSETRON HCL 4 MG/2ML IJ SOLN
8.0000 mg | Freq: Once | INTRAMUSCULAR | Status: AC
  Administered 2023-04-03: 8 mg via INTRAVENOUS
  Filled 2023-04-03: qty 4

## 2023-04-03 MED ORDER — ACETAMINOPHEN 325 MG PO TABS
650.0000 mg | ORAL_TABLET | Freq: Once | ORAL | Status: AC
  Administered 2023-04-03: 650 mg via ORAL
  Filled 2023-04-03: qty 2

## 2023-04-03 MED ORDER — CETIRIZINE HCL 10 MG/ML IV SOLN
10.0000 mg | Freq: Once | INTRAVENOUS | Status: AC
  Administered 2023-04-03: 10 mg via INTRAVENOUS
  Filled 2023-04-03: qty 1

## 2023-04-03 MED ORDER — SODIUM CHLORIDE 0.9 % IV SOLN: Freq: Once | INTRAVENOUS | Status: AC

## 2023-04-03 NOTE — Progress Notes (Signed)
Stable during Feraheme infusion without adverse affects.  Vital signs stable.  No complaints at this time.  Discharge from clinic ambulatory in stable condition.  Alert and oriented X 3.  Follow up with [facility] as scheduled.

## 2023-04-03 NOTE — Patient Instructions (Signed)
[facility]  Discharge Instructions: Thank you for choosing [facility] to provide your oncology and hematology care.  If you have a lab appointment with the Cancer Center - please note that after April 8th, 2024, all labs will be drawn in the cancer center.  You do not have to check in or register with the main entrance as you have in the past but will complete your check-in in the cancer center.  Wear comfortable clothing and clothing appropriate for easy access to any Portacath or PICC line.   We strive to give you quality time with your provider. You may need to reschedule your appointment if you arrive late (15 or more minutes).  Arriving late affects you and other patients whose appointments are after yours.  Also, if you miss three or more appointments without notifying the office, you may be dismissed from the clinic at the provider's discretion.      For prescription refill requests, have your pharmacy contact our office and allow 72 hours for refills to be completed.    Today you received the following chemotherapy and/or immunotherapy agents feraheme      To help prevent nausea and vomiting after your treatment, we encourage you to take your nausea medication as directed.  BELOW ARE SYMPTOMS THAT SHOULD BE REPORTED IMMEDIATELY: *FEVER GREATER THAN 100.4 F (38 C) OR HIGHER *CHILLS OR SWEATING *NAUSEA AND VOMITING THAT IS NOT CONTROLLED WITH YOUR NAUSEA MEDICATION *UNUSUAL SHORTNESS OF BREATH *UNUSUAL BRUISING OR BLEEDING *URINARY PROBLEMS (pain or burning when urinating, or frequent urination) *BOWEL PROBLEMS (unusual diarrhea, constipation, pain near the anus) TENDERNESS IN MOUTH AND THROAT WITH OR WITHOUT PRESENCE OF ULCERS (sore throat, sores in mouth, or a toothache) UNUSUAL RASH, SWELLING OR PAIN  UNUSUAL VAGINAL DISCHARGE OR ITCHING   Items with * indicate a potential emergency and should be followed up as soon as possible or go to the  Emergency Department if any problems should occur.  Please show the CHEMOTHERAPY ALERT CARD or IMMUNOTHERAPY ALERT CARD at check-in to the Emergency Department and triage nurse.  Should you have questions after your visit or need to cancel or reschedule your appointment, please contact [facility] CENTER AT [facility] [phone]  and follow the prompts.  Office hours are 8:00 a.m. to 4:30 p.m. Monday - Friday. Please note that voicemails left after 4:00 p.m. may not be returned until the following business day.  We are closed weekends and major holidays. You have access to a nurse at all times for urgent questions. Please call the main number to the clinic [phone] and follow the prompts.  For any non-urgent questions, you may also contact your provider using MyChart. We now offer e-Visits for anyone 93 and older to request care online for non-urgent symptoms. For details visit [email or web address].   Also download the MyChart app! Go to the app store, search "MyChart", open the app, select [facility], and log in with your MyChart username and password.

## 2023-04-04 ENCOUNTER — Other Ambulatory Visit (HOSPITAL_COMMUNITY): Payer: Self-pay | Admitting: Internal Medicine

## 2023-04-04 DIAGNOSIS — Z1231 Encounter for screening mammogram for malignant neoplasm of breast: Secondary | ICD-10-CM

## 2023-04-05 ENCOUNTER — Telehealth: Payer: Self-pay | Admitting: *Deleted

## 2023-04-05 ENCOUNTER — Other Ambulatory Visit (HOSPITAL_COMMUNITY): Payer: 59

## 2023-04-05 ENCOUNTER — Ambulatory Visit (HOSPITAL_COMMUNITY): Payer: 59

## 2023-04-05 ENCOUNTER — Encounter (HOSPITAL_COMMUNITY): Payer: Self-pay

## 2023-04-05 NOTE — Telephone Encounter (Signed)
Patient received Feraheme infusion on 8/26.  Called and stated that she has had a multitude of symptoms since, to include: severe nausea, diarrhea, abdominal distention, rash across abdomen, severe leg cramps that extend into thighs and swelling in throat.  Throat swelling is to the extent that she states she is unable to swallow pills and has had to puree her food in order to get it down.  She has taken medication she has on hand for nausea and benadryl, without effect.  Due to the above symptoms, she was advised to go to the ER for evaluation.  Verbalized understanding.

## 2023-04-09 ENCOUNTER — Other Ambulatory Visit: Payer: Self-pay | Admitting: Internal Medicine

## 2023-04-11 ENCOUNTER — Encounter: Payer: Self-pay | Admitting: Hematology

## 2023-04-11 ENCOUNTER — Encounter: Payer: Self-pay | Admitting: Internal Medicine

## 2023-04-11 ENCOUNTER — Ambulatory Visit (INDEPENDENT_AMBULATORY_CARE_PROVIDER_SITE_OTHER): Payer: 59 | Admitting: Internal Medicine

## 2023-04-11 VITALS — BP 113/64 | HR 93 | Ht 68.0 in | Wt 165.0 lb

## 2023-04-11 DIAGNOSIS — F411 Generalized anxiety disorder: Secondary | ICD-10-CM

## 2023-04-11 DIAGNOSIS — E041 Nontoxic single thyroid nodule: Secondary | ICD-10-CM

## 2023-04-11 DIAGNOSIS — I779 Disorder of arteries and arterioles, unspecified: Secondary | ICD-10-CM

## 2023-04-11 DIAGNOSIS — Z23 Encounter for immunization: Secondary | ICD-10-CM

## 2023-04-11 DIAGNOSIS — G500 Trigeminal neuralgia: Secondary | ICD-10-CM | POA: Insufficient documentation

## 2023-04-11 DIAGNOSIS — E1149 Type 2 diabetes mellitus with other diabetic neurological complication: Secondary | ICD-10-CM

## 2023-04-11 DIAGNOSIS — I10 Essential (primary) hypertension: Secondary | ICD-10-CM

## 2023-04-11 DIAGNOSIS — J9611 Chronic respiratory failure with hypoxia: Secondary | ICD-10-CM

## 2023-04-11 DIAGNOSIS — I700 Atherosclerosis of aorta: Secondary | ICD-10-CM | POA: Diagnosis not present

## 2023-04-11 DIAGNOSIS — Z79899 Other long term (current) drug therapy: Secondary | ICD-10-CM

## 2023-04-11 DIAGNOSIS — J439 Emphysema, unspecified: Secondary | ICD-10-CM | POA: Diagnosis not present

## 2023-04-11 DIAGNOSIS — G894 Chronic pain syndrome: Secondary | ICD-10-CM

## 2023-04-11 DIAGNOSIS — E782 Mixed hyperlipidemia: Secondary | ICD-10-CM | POA: Diagnosis not present

## 2023-04-11 DIAGNOSIS — G720 Drug-induced myopathy: Secondary | ICD-10-CM | POA: Diagnosis not present

## 2023-04-11 DIAGNOSIS — E162 Hypoglycemia, unspecified: Secondary | ICD-10-CM | POA: Diagnosis not present

## 2023-04-11 LAB — HEMOGLOBIN A1C: Hemoglobin A1C: 5.5

## 2023-04-11 MED ORDER — GVOKE HYPOPEN 2-PACK 1 MG/0.2ML ~~LOC~~ SOAJ: 0.2000 mL | SUBCUTANEOUS | 5 refills | Status: DC | PRN

## 2023-04-11 NOTE — Assessment & Plan Note (Signed)
Takes Ativan 0.5 mg TID PRN On Cymbalta Refilled, PDMP reviewed

## 2023-04-11 NOTE — Assessment & Plan Note (Signed)
BP Readings from Last 1 Encounters:  04/11/23 113/64   Well-controlled with Imdur (for CAD/HFpEF) Counseled for compliance with the medications Advised DASH diet and moderate exercise/walking as tolerated

## 2023-04-11 NOTE — Progress Notes (Unsigned)
Established Patient Office Visit  Subjective:  Patient ID: Kayla Ryan, female    DOB: 09-Oct-1955  Age: 67 y.o. MRN: [id number]  CC:  Chief Complaint  Patient presents with   Diabetes    Four month follow up    Back Pain    Patient states she is having back pain on the left side   face abnormal    Patient feels she is having electrical pulses going through her face     HPI Kayla Ryan is a 67 y.o. female with past medical history of HTN, HFpEF, COPD, GERD, type II DM with neuropathy, CKD, GAD and chronic pain syndrome who presents for f/u of her chronic medical conditions.  Type II DM: She has been taking Mounjaro, and has been tolerating it well.  Her blood glucose has improved now, but had episodes of hypoglycemia sometimes. She has a CGM - 95% within target range, <1% episode of hypoglycemia.  She is tolerating Jardiance now.  She has chronic numbness of bilateral foot.  She takes gabapentin 800 mg TID currently.  Denies any polyuria or polyphagia.  She has chronic fatigue.  GAD: She takes Ativan 0.5 mg 3 times daily as needed. She is also on Cymbalta.   She has history of COPD and chronic hypoxic respiratory failure.  She has acute Stiolto and as needed albuterol.  She uses 3 LPM O2 at home.  She has chronic low back pain, followed by pain clinic.  She takes Cymbalta 60 mg twice daily, gabapentin 800 mg 3 times daily and Norco 10-325 mg q4h PRN. She also has Robaxin as needed for muscle spasms.  Denies any recent fall or injury.  Denies saddle anesthesia, urinary or stool incontinence.  She reports episodes of facial pain for the last 3 months, mostly on the left side, electric shocklike sensations, lasting for few seconds, about once every other day.  Denies any provoking factors.  Denies any numbness or tingling of the facial area.  Denies any spasms of the cheek muscles.  Past Medical History:  Diagnosis Date   Allergy Unknown   Anemia    Anxiety 1883   Arthritis     Asthma    Chronic bronchitis    Crohn disease (HCC)    Emphysema    Fibromyalgia    GERD (gastroesophageal reflux disease)    GI bleed    Herpes    History of blood transfusion    Hyperlipidemia    Hypertension 2015   IBS (irritable bowel syndrome)    Kidney failure    Migraines    Muscular dystrophy (HCC)    Neck pain    PAF (paroxysmal atrial fibrillation) (HCC)    Not anticoagulated with history of severe GI bleeding   Plantar fasciitis    Polymyositis (HCC)    PONV (postoperative nausea and vomiting)    Right thyroid nodule 04/07/2022   Scoliosis    Type 2 diabetes mellitus (HCC)     Past Surgical History:  Procedure Laterality Date   ABDOMINAL HYSTERECTOMY     AGILE CAPSULE N/A 06/24/2021   Procedure: AGILE CAPSULE;  Surgeon: Lanelle Bal, DO;  Location: [facility];  Service: Endoscopy;  Laterality: N/A;   AGILE CAPSULE N/A 07/22/2021   Procedure: AGILE CAPSULE;  Surgeon: Corbin Ade, MD;  Location: [facility];  Service: Endoscopy;  Laterality: N/A;  7:30am   bone spur     BREAST SURGERY  1995   CHOLECYSTECTOMY  COLONOSCOPY WITH PROPOFOL N/A 04/30/2021   Procedure: COLONOSCOPY WITH PROPOFOL;  Surgeon: Corbin Ade, MD;  Location: [facility];  Service: Endoscopy;  Laterality: N/A;   ESOPHAGEAL DILATION N/A 04/30/2021   Procedure: ESOPHAGEAL DILATION;  Surgeon: Corbin Ade, MD;  [facility]: [facility];  Service: Endoscopy;  Laterality: N/A;   ESOPHAGOGASTRODUODENOSCOPY (EGD) WITH PROPOFOL N/A 04/30/2021   Procedure: ESOPHAGOGASTRODUODENOSCOPY (EGD) WITH PROPOFOL;  Surgeon: Corbin Ade, MD;  [facility]: [facility];  Service: Endoscopy;  Laterality: N/A;   GIVENS CAPSULE STUDY N/A 09/22/2021   Procedure: GIVENS CAPSULE STUDY;  Surgeon: Corbin Ade, MD;  [facility]: [facility];  Service: Endoscopy;  Laterality: N/A;  7:30am   HERNIA REPAIR     LEFT HEART CATHETERIZATION WITH CORONARY ANGIOGRAM N/A 11/07/2013   Procedure: LEFT HEART  CATHETERIZATION WITH CORONARY ANGIOGRAM;  Surgeon: Runell Gess, MD;  Location: [facility] CATH LAB;  Service: Cardiovascular;  Laterality: N/A;   MASTECTOMY PARTIAL / LUMPECTOMY     OOPHORECTOMY     ROTATOR CUFF REPAIR     TOOTH EXTRACTION N/A 04/15/2022   Procedure: DENTAL RESTORATION/EXTRACTIONS;  Surgeon: Ocie Doyne, DMD;  Location: [facility] OR;  Service: Oral Surgery;  Laterality: N/A;    Family History  Problem Relation Age of Onset   Lung cancer Mother    Cancer Mother    Miscarriages / [place] Mother    Varicose Veins Mother    COPD Father    Lung cancer Father    Lymphoma Father    Alcohol abuse Father    Arthritis Father    Anxiety disorder Sister    Depression Brother    Anxiety disorder Sister     Social History   Socioeconomic History   Marital status: Widowed    Spouse name: Not on file   Number of children: Not on file   Years of education: Not on file   Highest education level: Associate degree: occupational, Scientist, product/process development, or vocational program  Occupational History   Not on file  Tobacco Use   Smoking status: Former    Current packs/day: 0.00    Average packs/day: 1 pack/day for 49.0 years (49.0 ttl pk-yrs)    Types: Cigarettes    Start date: 08/08/1966    Quit date: 08/31/2005    Years since quitting: 17.6   Smokeless tobacco: Never  Vaping Use   Vaping status: Never Used  Substance and Sexual Activity   Alcohol use: No   Drug use: No    Comment: used marijuana in teens   Sexual activity: Not Currently    Birth control/protection: Surgical  Other Topics Concern   Not on file  Social History Narrative   Not on file   Social Determinants of Health   Financial Resource Strain: Low Risk  (10/28/2022)   Overall Financial Resource Strain (CARDIA)    Difficulty of Paying Living Expenses: Not very hard  Food Insecurity: Food Insecurity Present (10/28/2022)   Hunger Vital Sign    Worried About Running Out of Food in the Last Year: Sometimes true    Ran Out  of Food in the Last Year: Sometimes true  Transportation Needs: No Transportation Needs (10/28/2022)   PRAPARE - Administrator, Civil Service (Medical): No    Lack of Transportation (Non-Medical): No  Physical Activity: Insufficiently Active (10/28/2022)   Exercise Vital Sign    Days of Exercise per Week: 1 day    Minutes of Exercise per Session: 10 min  Stress:  Stress Concern Present (10/28/2022)   [employer] of Occupational Health - Occupational Stress Questionnaire    Feeling of Stress : To some extent  Social Connections: Unknown (10/28/2022)   Social Connection and Isolation Panel [NHANES]    Frequency of Communication with Friends and Family: Twice a week    Frequency of Social Gatherings with Friends and Family: Never    Attends Religious Services: Patient declined    Database administrator or Organizations: Patient declined    Attends Banker Meetings: Not on file    Marital Status: Widowed  Intimate Partner Violence: Not At Risk (03/14/2022)   Humiliation, Afraid, Rape, and Kick questionnaire    Fear of Current or Ex-Partner: No    Emotionally Abused: No    Physically Abused: No    Sexually Abused: No    Outpatient Medications Prior to Visit  Medication Sig Dispense Refill   ACCU-CHEK GUIDE test strip USE TO CHECK BLOOD GLUCOSE THREE TIMES DAILY, MORNING, AT NOON, AND AT BEDTIME 100 strip 0   Accu-Chek Softclix Lancets lancets USE TO TEST BLOOD SUGAR EVERY MORNING, AT NOON AND EVERY NIGHT AT BEDTIME 100 each 0   acyclovir (ZOVIRAX) 400 MG tablet Take 1 tablet (400 mg total) by mouth 3 (three) times daily. 30 tablet 0   benzonatate (TESSALON) 200 MG capsule Take 1 capsule (200 mg total) by mouth every 6 (six) hours as needed for cough. 60 capsule 1   cetirizine (ZYRTEC) 10 MG tablet Take 10 mg by mouth 2 (two) times daily.     Continuous Glucose Receiver (DEXCOM G7 RECEIVER) DEVI USE TO CHECK BLOOD GLUCOSE AS NEEDED 1 each 0   Continuous Glucose  Sensor (DEXCOM G7 SENSOR) MISC USE TO CHECK BLOOD SUGAR THREE TIMES DAILY, BEFORE MEALS AND AT BEDTIME AND AS NEEDED. CHANGE SENSOR EVERY 10 DAYS 3 each 5   dicyclomine (BENTYL) 10 MG capsule Take 1 capsule (10 mg total) by mouth 4 (four) times daily -  before meals and at bedtime. Monitor for constipation, dry mouth, dizziness (Patient taking differently: Take 10 mg by mouth 4 (four) times daily as needed (Bowel Spasm/Diarrhea). Monitor for constipation, dry mouth, dizziness) 120 capsule 3   diphenhydrAMINE (BENADRYL) 25 MG tablet Take 25 mg by mouth every 4 (four) hours as needed for allergies or itching.     doxepin (SINEQUAN) 10 MG capsule TAKE 1 CAPSULE(10 MG) BY MOUTH AT BEDTIME 30 capsule 3   DULoxetine (CYMBALTA) 60 MG capsule Take 1 capsule (60 mg total) by mouth 2 (two) times daily. (Patient taking differently: Take 60 mg by mouth every evening.) 60 capsule 3   ezetimibe (ZETIA) 10 MG tablet Take 1 tablet (10 mg total) by mouth daily. 90 tablet 3   gabapentin (NEURONTIN) 800 MG tablet TAKE 1 TABLET(800 MG) BY MOUTH THREE TIMES DAILY 90 tablet 5   Glycerin-Hypromellose-PEG 400 (DRY EYE RELIEF DROPS OP) Place 1 drop into both eyes 3 (three) times daily as needed (Dry eye).     HYDROcodone bit-homatropine (HYCODAN) 5-1.5 MG/5ML syrup Take 5 mLs by mouth every 6 (six) hours as needed for cough. 120 mL 0   HYDROcodone-acetaminophen (NORCO) 10-325 MG tablet Take 1 tablet by mouth every 4 (four) hours as needed. Stop Hydrocodone 7.5/325mg  tablet. 180 tablet 0   ipratropium (ATROVENT HFA) 17 MCG/ACT inhaler Inhale 2 puffs into the lungs every 4 (four) hours as needed (COPD). 1 each 4   isosorbide dinitrate (ISORDIL) 30 MG tablet TAKE 1 TABLET(30 MG) BY  MOUTH DAILY 90 tablet 1   JARDIANCE 10 MG TABS tablet TAKE 1 TABLET(10 MG) BY MOUTH DAILY BEFORE BREAKFAST 30 tablet 5   Lancets Misc. (ACCU-CHEK SOFTCLIX LANCET DEV) KIT USE TO CHECK GLUCOSE THREE TIMES DAILY 1 kit 0   LORazepam (ATIVAN) 0.5 MG tablet  TAKE 1 TABLET(0.5 MG) BY MOUTH THREE TIMES DAILY AS NEEDED FOR ANXIETY 90 tablet 3   meclizine (ANTIVERT) 25 MG tablet TAKE 1 TABLET(25 MG) BY MOUTH THREE TIMES DAILY AS NEEDED FOR DIZZINESS 30 tablet 1   metFORMIN (GLUCOPHAGE) 500 MG tablet TAKE 1 TABLET(500 MG) BY MOUTH TWICE DAILY WITH A MEAL 60 tablet 2   methocarbamol (ROBAXIN) 500 MG tablet TAKE 1 TABLET(500 MG) BY MOUTH EVERY 8 HOURS AS NEEDED FOR MUSCLE SPASMS 30 tablet 1   Misc. Devices KIT Henmnii rollator walker 1 kit 0   montelukast (SINGULAIR) 10 MG tablet Take 10 mg by mouth at bedtime.     NITROSTAT 0.4 MG SL tablet Place 0.4 mg under the tongue every 15 (fifteen) minutes as needed for chest pain.      omeprazole (PRILOSEC) 40 MG capsule TAKE 1 CAPSULE(40 MG) BY MOUTH TWICE DAILY 180 capsule 0   ondansetron (ZOFRAN-ODT) 8 MG disintegrating tablet DISSOLVE 1 TABLET(8 MG) ON THE TONGUE EVERY 8 HOURS AS NEEDED FOR NAUSEA OR VOMITING 257 tablet 1   simethicone (GAS-X) 80 MG chewable tablet Chew 1 tablet (80 mg total) by mouth every 6 (six) hours as needed for flatulence. 30 tablet 0   Tiotropium Bromide-Olodaterol (STIOLTO RESPIMAT) 2.5-2.5 MCG/ACT AERS Inhale 2 each into the lungs daily. 4 g 11   tirzepatide (MOUNJARO) 5 MG/0.5ML Pen Inject 5 mg into the skin once a week. 6 mL 1   gemfibrozil (LOPID) 600 MG tablet Take 1 tablet (600 mg total) by mouth 2 (two) times daily before a meal. 180 tablet 1   Glucagon (GVOKE HYPOPEN 2-PACK) 1 MG/0.2ML SOAJ Inject 0.2 mLs into the skin as needed (Blood glucose < 53). 0.4 mL 5   MOUNJARO 5 MG/0.5ML Pen Inject into the skin.     No facility-administered medications prior to visit.    Allergies  Allergen Reactions   Sulfa Antibiotics Shortness Of Breath, Swelling and Rash   Clindamycin/Lincomycin    Dilaudid [Hydromorphone Hcl]     Makes me crazy   Iron     Throat starts closing up, tongue swells, severe headaches and backpain   Metronidazole Diarrhea and Nausea And Vomiting   Prednisone  Other (See Comments)    Angry     Statins Other (See Comments)    Unknown    Cephalexin Diarrhea and Nausea And Vomiting   Methotrexate Derivatives Swelling and Rash   Morphine And Codeine Anxiety    "exreme mood swings"    ROS Review of Systems  Constitutional:  Positive for fatigue. Negative for chills and fever.  HENT:  Negative for congestion, sinus pressure, sinus pain and sore throat.   Eyes:  Negative for pain and discharge.  Respiratory:  Positive for shortness of breath. Negative for cough.   Cardiovascular:  Negative for chest pain and palpitations.  Gastrointestinal:  Positive for constipation. Negative for abdominal pain, nausea and vomiting.  Endocrine: Negative for polydipsia and polyuria.  Genitourinary:  Positive for difficulty urinating. Negative for dysuria and hematuria.  Musculoskeletal:  Positive for arthralgias, back pain, gait problem and neck pain. Negative for neck stiffness.  Skin:  Negative for rash.  Neurological:  Negative for dizziness and weakness.  Psychiatric/Behavioral:  Positive for sleep disturbance. Negative for agitation and behavioral problems. The patient is nervous/anxious.       Objective:    Physical Exam Vitals reviewed.  Constitutional:      General: She is not in acute distress.    Appearance: She is not diaphoretic.  HENT:     Head: Normocephalic and atraumatic.     Nose: No congestion.     Mouth/Throat:     Mouth: Mucous membranes are moist.  Eyes:     General: No scleral icterus.    Extraocular Movements: Extraocular movements intact.  Neck:     Comments: Right-sided neck mass - thyroid nodule, nontender, about 4 cm in diameter Cardiovascular:     Rate and Rhythm: Normal rate and regular rhythm.     Pulses: Normal pulses.     Heart sounds: Normal heart sounds. No murmur heard. Pulmonary:     Breath sounds: Normal breath sounds. No wheezing or rales.     Comments: On 3 LPM O2 Musculoskeletal:     Cervical back: Neck  supple. No tenderness.     Right lower leg: No edema.     Left lower leg: No edema.  Skin:    General: Skin is warm.     Findings: No rash.  Neurological:     General: No focal deficit present.     Mental Status: She is alert and oriented to person, place, and time.     Sensory: Sensory deficit present.     Motor: Weakness (4/5 in b/l UE and LE) present.  Psychiatric:        Mood and Affect: Mood normal.        Behavior: Behavior normal.     BP 113/64 (BP Location: Left Arm, Patient Position: Sitting, Cuff Size: Normal)   Pulse 93   Ht 5\' 8"  (1.727 m)   Wt 165 lb (74.8 kg)   SpO2 94%   BMI 25.09 kg/m  Wt Readings from Last 3 Encounters:  04/11/23 165 lb (74.8 kg)  02/22/23 163 lb 9.6 oz (74.2 kg)  01/20/23 159 lb 6.4 oz (72.3 kg)    Lab Results  Component Value Date   TSH 3.800 09/01/2022   Lab Results  Component Value Date   WBC 7.6 03/13/2023   HGB 11.0 (L) 03/13/2023   HCT 37.0 03/13/2023   MCV 85.3 03/13/2023   PLT 508 (H) 03/13/2023   Lab Results  Component Value Date   NA 140 12/08/2022   K 4.7 12/08/2022   CO2 23 12/08/2022   GLUCOSE 85 12/08/2022   BUN 39 (H) 12/08/2022   CREATININE 1.00 12/08/2022   BILITOT 0.2 12/08/2022   ALKPHOS 73 12/08/2022   AST 26 12/08/2022   ALT 31 12/08/2022   PROT 7.6 12/08/2022   ALBUMIN 4.8 12/08/2022   CALCIUM 10.0 12/08/2022   ANIONGAP 13 04/15/2022   EGFR 61 12/08/2022   GFR 58.40 (L) 01/28/2021   Lab Results  Component Value Date   CHOL 200 (H) 12/08/2022   Lab Results  Component Value Date   HDL 46 12/08/2022   Lab Results  Component Value Date   LDLCALC 116 (H) 12/08/2022   Lab Results  Component Value Date   TRIG 216 (H) 12/08/2022   Lab Results  Component Value Date   CHOLHDL 4.3 12/08/2022   Lab Results  Component Value Date   HGBA1C 5.5 04/11/2023      Assessment & Plan:   Problem List Items Addressed This  Visit       Cardiovascular and Mediastinum   Carotid artery disease  (HCC)    US Carotid artery showed 50-69% stenosis b/l Does not tolerate statin Followed by Cardiology      Essential hypertension    BP Readings from Last 1 Encounters:  04/11/23 113/64   Well-controlled with Imdur (for CAD/HFpEF) Counseled for compliance with the medications Advised DASH diet and moderate exercise/walking as tolerated      Atherosclerosis of aorta (HCC)    Noted on CT chest Does not tolerate statin On Zetia        Respiratory   Chronic obstructive pulmonary disease/Emphysema    Well controlled with Stiolto and as needed albuterol Followed by Pulmonology Has 3 lpm home O2 for chronic hypoxia      Chronic respiratory failure with hypoxia (HCC)    Due to COPD Followed by Pulmonology Has 3 lpm home O2 for chronic hypoxia        Endocrine   Type 2 diabetes mellitus with neurological complications (HCC) - Primary    Lab Results  Component Value Date   HGBA1C 5.5 04/11/2023   Well controlled now On Metformin and Mounjaro 5 mg qw Has had episodes of hypoglycemia in the past, has CGM now, no recent episode of hypoglycemia -  DC Metformin as she has tightly controlled glycemic profile  On Jardiance for HFpEF (did not tolerate Farxiga 10 mg) Advised to follow diabetic diet Has statin intolerance F/u CMP and lipid panel Diabetic eye exam: Advised to follow up with Ophthalmology for diabetic eye exam  Takes gabapentin 800 mg TID, she has noticed improvement in numbness and tingling of bilateral LE      Relevant Medications   Glucagon (GVOKE HYPOPEN 2-PACK) 1 MG/0.2ML SOAJ   Other Relevant Orders   Bayer DCA Hb A1c Waived (Completed)   CMP14+EGFR   Hemoglobin A1c   Urine Microalbumin w/creat. ratio   Right thyroid nodule    Recent US of thyroid showed 3.6 cm thyroid nodule TSH wnl Repeat US thyroid after 1 year        Nervous and Auditory   Trigeminal neuralgia    Her facial pain is characteristic of trigeminal neuralgia Referred to  Neurology      Relevant Orders   Ambulatory referral to Neurology     Musculoskeletal and Integument   Statin myopathy    Has tried at least 2 different statins in the past        Other   Hyperlipidemia    Advised to follow low carb diet 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      Relevant Orders   Lipid Profile   GAD (generalized anxiety disorder)    Takes Ativan 0.5 mg TID PRN On Cymbalta Refilled, PDMP reviewed      Chronic pain    Followed by Dr Venia Carbon On Norco      Chronic prescription benzodiazepine use   Relevant Orders   ToxASSURE Select 13 (MW), Urine   Other Visit Diagnoses     Hypoglycemia       Relevant Medications   Glucagon (GVOKE HYPOPEN 2-PACK) 1 MG/0.2ML SOAJ   Encounter for immunization       Relevant Orders   Flu Vaccine Trivalent High Dose (Fluad) (Completed)       Meds ordered this encounter  Medications   Glucagon (GVOKE HYPOPEN 2-PACK) 1 MG/0.2ML SOAJ    Sig: Inject 0.2 mLs into the skin as needed (Blood glucose < 53).  Dispense:  0.4 mL    Refill:  5    Follow-up: Return in about 4 months (around 08/11/2023) for DM.    Anabel Halon, MD

## 2023-04-11 NOTE — Assessment & Plan Note (Addendum)
Recent US of thyroid showed 3.6 cm thyroid nodule TSH wnl Repeat US thyroid after 1 year

## 2023-04-11 NOTE — Patient Instructions (Addendum)
Please continue to take medications as prescribed.  Please continue to follow low carb diet and ambulate as tolerated.  Please get fasting blood tests done before the next visit. 

## 2023-04-11 NOTE — Assessment & Plan Note (Signed)
Lab Results  Component Value Date   HGBA1C 5.5 04/11/2023   Well controlled now On Metformin and Mounjaro 5 mg qw Has had episodes of hypoglycemia in the past, has CGM now, no recent episode of hypoglycemia -  DC Metformin as she has tightly controlled glycemic profile  On Jardiance for HFpEF (did not tolerate Farxiga 10 mg) Advised to follow diabetic diet Has statin intolerance F/u CMP and lipid panel Diabetic eye exam: Advised to follow up with Ophthalmology for diabetic eye exam  Takes gabapentin 800 mg TID, she has noticed improvement in numbness and tingling of bilateral LE

## 2023-04-12 LAB — BAYER DCA HB A1C WAIVED: HB A1C (BAYER DCA - WAIVED): 5.5 % (ref 4.8–5.6)

## 2023-04-13 DIAGNOSIS — I700 Atherosclerosis of aorta: Secondary | ICD-10-CM | POA: Insufficient documentation

## 2023-04-13 NOTE — Assessment & Plan Note (Signed)
Due to COPD Followed by Pulmonology Has 3 lpm home O2 for chronic hypoxia 

## 2023-04-13 NOTE — Assessment & Plan Note (Signed)
Noted on CT chest Does not tolerate statin On Zetia

## 2023-04-13 NOTE — Assessment & Plan Note (Addendum)
Has tried at least 2 different statins in the past

## 2023-04-13 NOTE — Assessment & Plan Note (Signed)
Advised to follow low carb diet 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

## 2023-04-13 NOTE — Assessment & Plan Note (Signed)
US Carotid artery showed 50-69% stenosis b/l Does not tolerate statin Followed by Cardiology

## 2023-04-13 NOTE — Assessment & Plan Note (Signed)
Followed by Dr Myles Rosenthal On [facility] [facility] [employer]

## 2023-04-13 NOTE — Assessment & Plan Note (Signed)
Her facial pain is characteristic of trigeminal neuralgia Referred to Neurology

## 2023-04-13 NOTE — Assessment & Plan Note (Signed)
Well controlled with Stiolto and as needed albuterol Followed by Pulmonology Has 3 lpm home O2 for chronic hypoxia

## 2023-04-14 ENCOUNTER — Encounter: Payer: Self-pay | Admitting: Neurology

## 2023-04-14 ENCOUNTER — Ambulatory Visit (HOSPITAL_COMMUNITY): Payer: 59

## 2023-04-14 ENCOUNTER — Other Ambulatory Visit (HOSPITAL_COMMUNITY): Payer: 59

## 2023-04-15 LAB — TOXASSURE SELECT 13 (MW), URINE

## 2023-04-17 ENCOUNTER — Telehealth: Payer: Self-pay | Admitting: Emergency Medicine

## 2023-04-17 ENCOUNTER — Encounter: Payer: Self-pay | Admitting: Physician Assistant

## 2023-04-17 ENCOUNTER — Ambulatory Visit (INDEPENDENT_AMBULATORY_CARE_PROVIDER_SITE_OTHER): Payer: 59

## 2023-04-17 DIAGNOSIS — Z0000 Encounter for general adult medical examination without abnormal findings: Secondary | ICD-10-CM

## 2023-04-17 NOTE — Progress Notes (Signed)
Subjective:   Kayla Ryan is a 67 y.o. female who presents for Medicare Annual (Subsequent) preventive examination.  Visit Complete: Virtual  I connected with  Kayla Ryan on 04/17/23 by a audio enabled telemedicine application and verified that I am speaking with the correct person using two identifiers.  Patient Location: Home  Provider Location: Office/Clinic  I discussed the limitations of evaluation and management by telemedicine. The patient expressed understanding and agreed to proceed.  Patient Medicare AWV questionnaire was completed by the patient on 04/13/2023; I have confirmed that all information answered by patient is correct and no changes since this date.  Vital Signs: Unable to obtain new vitals due to this being a telehealth visit.   Review of Systems     Ms. Kayla Ryan , Thank you for taking time to come for your Medicare Wellness Visit. I appreciate your ongoing commitment to your health goals. Please review the following plan we discussed and let me know if I can assist you in the future.   These are the goals we discussed:  Goals      Patient Stated     Lose weight.     Patient Stated     Get teeth.        This is a list of the screening recommended for you and due dates:  Health Maintenance  Topic Date Due   Eye exam for diabetics  Never done   Mammogram  02/18/2018   DEXA scan (bone density measurement)  10/30/2018   DTaP/Tdap/Td vaccine (1 - Tdap) 10/31/2019   COVID-19 Vaccine (3 - Pfizer risk series) 12/18/2019   Zoster (Shingles) Vaccine (2 of 2) 07/29/2022   Yearly kidney health urinalysis for diabetes  06/04/2023   Hemoglobin A1C  10/09/2023   Yearly kidney function blood test for diabetes  12/08/2023   Complete foot exam   04/10/2024   Medicare Annual Wellness Visit  04/16/2024   Colon Cancer Screening  05/01/2031   Pneumonia Vaccine  Completed   Flu Shot  Completed   Hepatitis C Screening  Completed   HPV Vaccine  Aged Out           Objective:    Today's Vitals   04/17/23 1527  PainSc: 6    There is no height or weight on file to calculate BMI.     04/17/2023    3:35 PM 03/24/2023    3:36 PM 03/20/2023    8:35 AM 09/26/2022    3:02 PM 06/15/2022    2:49 PM 05/20/2022    8:29 AM 04/13/2022    6:44 PM  Advanced Directives  Does Patient Have a Medical Advance Directive? No No No No No No No  Would patient like information on creating a medical advance directive? No - Patient declined No - Patient declined No - Patient declined No - Patient declined No - Patient declined No - Patient declined     Current Medications (verified) Outpatient Encounter Medications as of 04/17/2023  Medication Sig   ACCU-CHEK GUIDE test strip USE TO CHECK BLOOD GLUCOSE THREE TIMES DAILY, MORNING, AT NOON, AND AT BEDTIME   Accu-Chek Softclix Lancets lancets USE TO TEST BLOOD SUGAR EVERY MORNING, AT NOON AND EVERY NIGHT AT BEDTIME   acyclovir (ZOVIRAX) 400 MG tablet Take 1 tablet (400 mg total) by mouth 3 (three) times daily.   benzonatate (TESSALON) 200 MG capsule Take 1 capsule (200 mg total) by mouth every 6 (six) hours as needed for cough.  cetirizine (ZYRTEC) 10 MG tablet Take 10 mg by mouth 2 (two) times daily.   Continuous Glucose Receiver (DEXCOM G7 RECEIVER) DEVI USE TO CHECK BLOOD GLUCOSE AS NEEDED   Continuous Glucose Sensor (DEXCOM G7 SENSOR) MISC USE TO CHECK BLOOD SUGAR THREE TIMES DAILY, BEFORE MEALS AND AT BEDTIME AND AS NEEDED. CHANGE SENSOR EVERY 10 DAYS   dicyclomine (BENTYL) 10 MG capsule Take 1 capsule (10 mg total) by mouth 4 (four) times daily -  before meals and at bedtime. Monitor for constipation, dry mouth, dizziness (Patient taking differently: Take 10 mg by mouth 4 (four) times daily as needed (Bowel Spasm/Diarrhea). Monitor for constipation, dry mouth, dizziness)   diphenhydrAMINE (BENADRYL) 25 MG tablet Take 25 mg by mouth every 4 (four) hours as needed for allergies or itching.   doxepin (SINEQUAN) 10 MG  capsule TAKE 1 CAPSULE(10 MG) BY MOUTH AT BEDTIME   DULoxetine (CYMBALTA) 60 MG capsule Take 1 capsule (60 mg total) by mouth 2 (two) times daily. (Patient taking differently: Take 60 mg by mouth every evening.)   ezetimibe (ZETIA) 10 MG tablet Take 1 tablet (10 mg total) by mouth daily.   gabapentin (NEURONTIN) 800 MG tablet TAKE 1 TABLET(800 MG) BY MOUTH THREE TIMES DAILY   Glucagon (GVOKE HYPOPEN 2-PACK) 1 MG/0.2ML SOAJ Inject 0.2 mLs into the skin as needed (Blood glucose < 53).   Glycerin-Hypromellose-PEG 400 (DRY EYE RELIEF DROPS OP) Place 1 drop into both eyes 3 (three) times daily as needed (Dry eye).   HYDROcodone bit-homatropine (HYCODAN) 5-1.5 MG/5ML syrup Take 5 mLs by mouth every 6 (six) hours as needed for cough.   HYDROcodone-acetaminophen (NORCO) 10-325 MG tablet Take 1 tablet by mouth every 4 (four) hours as needed. Stop Hydrocodone 7.5/325mg  tablet.   ipratropium (ATROVENT HFA) 17 MCG/ACT inhaler Inhale 2 puffs into the lungs every 4 (four) hours as needed (COPD).   isosorbide dinitrate (ISORDIL) 30 MG tablet TAKE 1 TABLET(30 MG) BY MOUTH DAILY   JARDIANCE 10 MG TABS tablet TAKE 1 TABLET(10 MG) BY MOUTH DAILY BEFORE BREAKFAST   Lancets Misc. (ACCU-CHEK SOFTCLIX LANCET DEV) KIT USE TO CHECK GLUCOSE THREE TIMES DAILY   LORazepam (ATIVAN) 0.5 MG tablet TAKE 1 TABLET(0.5 MG) BY MOUTH THREE TIMES DAILY AS NEEDED FOR ANXIETY   meclizine (ANTIVERT) 25 MG tablet TAKE 1 TABLET(25 MG) BY MOUTH THREE TIMES DAILY AS NEEDED FOR DIZZINESS   metFORMIN (GLUCOPHAGE) 500 MG tablet TAKE 1 TABLET(500 MG) BY MOUTH TWICE DAILY WITH A MEAL   methocarbamol (ROBAXIN) 500 MG tablet TAKE 1 TABLET(500 MG) BY MOUTH EVERY 8 HOURS AS NEEDED FOR MUSCLE SPASMS   Misc. Devices KIT Henmnii rollator walker   montelukast (SINGULAIR) 10 MG tablet Take 10 mg by mouth at bedtime.   NITROSTAT 0.4 MG SL tablet Place 0.4 mg under the tongue every 15 (fifteen) minutes as needed for chest pain.    omeprazole (PRILOSEC) 40  MG capsule TAKE 1 CAPSULE(40 MG) BY MOUTH TWICE DAILY   ondansetron (ZOFRAN-ODT) 8 MG disintegrating tablet DISSOLVE 1 TABLET(8 MG) ON THE TONGUE EVERY 8 HOURS AS NEEDED FOR NAUSEA OR VOMITING   simethicone (GAS-X) 80 MG chewable tablet Chew 1 tablet (80 mg total) by mouth every 6 (six) hours as needed for flatulence.   Tiotropium Bromide-Olodaterol (STIOLTO RESPIMAT) 2.5-2.5 MCG/ACT AERS Inhale 2 each into the lungs daily.   tirzepatide [facility]) 5 MG/0.5ML Pen Inject 5 mg into the skin once a week.   No facility-administered encounter medications on file as of 04/17/2023.  Allergies (verified) Sulfa antibiotics, Clindamycin/lincomycin, Dilaudid [hydromorphone hcl], Iron, Metronidazole, Prednisone, Statins, Cephalexin, Methotrexate derivatives, and Morphine and codeine   History: Past Medical History:  Diagnosis Date   Allergy Unknown   Anemia    Anxiety 1883   Arthritis    Asthma    CHF (congestive heart failure) (HCC) 2021   Chronic bronchitis    Crohn disease (HCC)    Emphysema    Emphysema of lung (HCC) 2003   Fibromyalgia    GERD (gastroesophageal reflux disease)    GI bleed    Herpes    History of blood transfusion    Hyperlipidemia    Hypertension 2015   IBS (irritable bowel syndrome)    Kidney failure    Migraines    Muscular dystrophy (HCC)    Neck pain    Oxygen deficiency 2021   PAF (paroxysmal atrial fibrillation) (HCC)    Not anticoagulated with history of severe GI bleeding   Plantar fasciitis    Polymyositis (HCC)    PONV (postoperative nausea and vomiting)    Right thyroid nodule 04/07/2022   Scoliosis    Type 2 diabetes mellitus (HCC)    Past Surgical History:  Procedure Laterality Date   ABDOMINAL HYSTERECTOMY     AGILE CAPSULE N/A 06/24/2021   Procedure: AGILE CAPSULE;  Surgeon: Lanelle Bal, DO;  Location: [facility];  Service: Endoscopy;  Laterality: N/A;   AGILE CAPSULE N/A 07/22/2021   Procedure: AGILE CAPSULE;  Surgeon: Corbin Ade, MD;  Location: [facility];  Service: Endoscopy;  Laterality: N/A;  7:30am   bone spur     BREAST SURGERY  1995   CHOLECYSTECTOMY     COLONOSCOPY WITH PROPOFOL N/A 04/30/2021   Procedure: COLONOSCOPY WITH PROPOFOL;  Surgeon: Corbin Ade, MD;  Location: [facility];  Service: Endoscopy;  Laterality: N/A;   ESOPHAGEAL DILATION N/A 04/30/2021   Procedure: ESOPHAGEAL DILATION;  Surgeon: Corbin Ade, MD;  [facility]: [facility];  Service: Endoscopy;  Laterality: N/A;   ESOPHAGOGASTRODUODENOSCOPY (EGD) WITH PROPOFOL N/A 04/30/2021   Procedure: ESOPHAGOGASTRODUODENOSCOPY (EGD) WITH PROPOFOL;  Surgeon: Corbin Ade, MD;  Location: [facility];  Service: Endoscopy;  Laterality: N/A;   GIVENS CAPSULE STUDY N/A 09/22/2021   Procedure: GIVENS CAPSULE STUDY;  Surgeon: Corbin Ade, MD;  Location: [facility];  Service: Endoscopy;  Laterality: N/A;  7:30am   HERNIA REPAIR     LEFT HEART CATHETERIZATION WITH CORONARY ANGIOGRAM N/A 11/07/2013   Procedure: LEFT HEART CATHETERIZATION WITH CORONARY ANGIOGRAM;  Surgeon: Runell Gess, MD;  Location[facility] C[facility];  Service: Cardiovascular;  Laterality: N/A;   MASTECTOMY PARTIAL / LUMPECTOMY     OOPHORECTOMY     ROTATOR CUFF REPAIR     TOOTH EXTRACTION N/A 04/15/2022   Procedure: DENTAL RESTORATION/EXTRACTIONS;  Surgeon: Ocie Doyne, DMD;  Location: [facility] OR;  Service: Oral Surgery;  Laterality: N/A;   Family History  Problem Relation Age of Onset   Lung cancer Mother    Cancer Mother    Miscarriages / [place] Mother    Varicose Veins Mother    Hypertension Mother    COPD Father    Lung cancer Father    Lymphoma Father    Alcohol abuse Father    Arthritis Father    Anxiety disorder Sister    Depression Brother    Anxiety disorder Sister    Intellectual disability Sister    Social History   Socioeconomic History  Marital status: Widowed    Spouse name: Not on file   Number of children: Not on file   Years of  education: Not on file   Highest education level: Associate degree: occupational, Scientist, product/process development, or vocational program  Occupational History   Not on file  Tobacco Use   Smoking status: Former    Current packs/day: 0.00    Average packs/day: 1 pack/day for 49.0 years (49.0 ttl pk-yrs)    Types: Cigarettes    Start date: 08/08/1966    Quit date: 08/31/2005    Years since quitting: 17.6   Smokeless tobacco: Never  Vaping Use   Vaping status: Never Used  Substance and Sexual Activity   Alcohol use: No   Drug use: No    Comment: used marijuana in teens   Sexual activity: Not Currently    Birth control/protection: Surgical  Other Topics Concern   Not on file  Social History Narrative   Not on file   Social Determinants of Health   Financial Resource Strain: Medium Risk (04/13/2023)   Overall Financial Resource Strain (CARDIA)    Difficulty of Paying Living Expenses: Somewhat hard  Food Insecurity: Food Insecurity Present (04/13/2023)   Hunger Vital Sign    Worried About Running Out of Food in the Last Year: Sometimes true    Ran Out of Food in the Last Year: Sometimes true  Transportation Needs: No Transportation Needs (04/13/2023)   PRAPARE - Administrator, Civil Service (Medical): No    Lack of Transportation (Non-Medical): No  Physical Activity: Insufficiently Active (04/13/2023)   Exercise Vital Sign    Days of Exercise per Week: 3 days    Minutes of Exercise per Session: 30 min  Stress: No Stress Concern Present (04/13/2023)   [employer] of Occupational Health - Occupational Stress Questionnaire    Feeling of Stress : Only a little  Social Connections: Unknown (04/17/2023)   Social Connection and Isolation Panel [NHANES]    Frequency of Communication with Friends and Family: More than three times a week    Frequency of Social Gatherings with Friends and Family: Patient declined    Attends Religious Services: Patient declined    Database administrator or  Organizations: No    Attends Banker Meetings: Never    Marital Status: Widowed    Tobacco Counseling Counseling given: Not Answered   Clinical Intake:     Pain Score: 6      BMI - recorded: 25 Nutritional Risks: None Diabetes: Yes CBG done?: No Did pt. bring in CBG monitor from home?: No  How often do you need to have someone help you when you read instructions, pamphlets, or other written materials from your doctor or pharmacy?: 1 - Never What is the last grade level you completed in school?: 2 year college  Interpreter Needed?: No      Activities of Daily Living    04/13/2023    1:28 PM  In your present state of health, do you have any difficulty performing the following activities:  Hearing? 0  Vision? 1  Difficulty concentrating or making decisions? 0  Walking or climbing stairs? 1  Dressing or bathing? 1  Doing errands, shopping? 1  Preparing Food and eating ? N  Using the Toilet? N  In the past six months, have you accidently leaked urine? Y  Do you have problems with loss of bowel control? N  Managing your Medications? N  Managing your Finances? N  Housekeeping or managing your Housekeeping? Y    Patient Care Team: Anabel Halon, MD as PCP - General (Internal Medicine) Jonelle Sidle, MD as PCP - Cardiology (Cardiology) Doreatha Massed, MD as Medical Oncologist (Hematology)  Indicate any recent Medical Services you may have received from other than [facility] providers in the past year (date may be approximate).     Assessment:   This is a routine wellness examination for Kayla Ryan.  Hearing/Vision screen No results found.   Goals Addressed             This Visit's Progress    Patient Stated       Get teeth.      Depression Screen    04/17/2023    3:34 PM 04/11/2023    3:03 PM 12/08/2022    3:02 PM 11/01/2022    2:07 PM 09/01/2022    3:07 PM 06/03/2022   10:11 AM 05/19/2022   11:37 AM  PHQ 2/9 Scores  PHQ - 2 Score 0  0 0 0 0 1 0  PHQ- 9 Score   0   1     Fall Risk    04/17/2023    3:35 PM 04/13/2023    1:28 PM 04/11/2023    3:03 PM 12/08/2022    3:02 PM 11/01/2022    2:07 PM  Fall Risk   Falls in the past year? 0 0 0 0 1  Number falls in past yr: 0 0 0 0 1  Injury with Fall? 0 1 0 0 1  Risk for fall due to : No Fall Risks   No Fall Risks   Follow up Falls evaluation completed   Falls evaluation completed     MEDICARE RISK AT HOME: Medicare Risk at Home Any stairs in or around the home?: No If so, are there any without handrails?: No Home free of loose throw rugs in walkways, pet beds, electrical cords, etc?: Yes Adequate lighting in your home to reduce risk of falls?: Yes Life alert?: No Use of a cane, walker or w/c?: Yes Grab bars in the bathroom?: Yes Shower chair or bench in shower?: Yes Elevated toilet seat or a handicapped toilet?: Yes  TIMED UP AND GO:  Was the test performed?  No    Cognitive Function:    03/14/2022    3:27 PM  MMSE - Mini Mental State Exam  Not completed: Unable to complete        04/17/2023    3:36 PM 03/14/2022    3:27 PM  6CIT Screen  What Year? 0 points 0 points  What month? 0 points 0 points  What time? 0 points 0 points  Count back from 20 0 points 0 points  Months in reverse 2 points 2 points  Repeat phrase 0 points 0 points  Total Score 2 points 2 points    Immunizations Immunization History  Administered Date(s) Administered   Fluad Quad(high Dose 65+) 05/27/2020, 06/03/2022   Fluad Trivalent(High Dose 65+) 04/11/2023   Influenza-Unspecified 06/17/2016, 05/25/2021   PFIZER(Purple Top)SARS-COV-2 Vaccination 10/02/2019, 11/20/2019   PNEUMOCOCCAL CONJUGATE-20 09/15/2021   Tdap 10/31/2019   Zoster Recombinant(Shingrix) 06/03/2022    TDAP status: Due, Education has been provided regarding the importance of this vaccine. Advised may receive this vaccine at local pharmacy or Health Dept. Aware to provide a copy of the vaccination record if obtained  from local pharmacy or Health Dept. Verbalized acceptance and understanding.  Flu Vaccine status: Up to date  Pneumococcal vaccine  status: Up to date  Covid-19 vaccine status: Completed vaccines  Qualifies for Shingles Vaccine? Yes   Zostavax completed Yes   Shingrix Completed?: No.    Education has been provided regarding the importance of this vaccine. Patient has been advised to call insurance company to determine out of pocket expense if they have not yet received this vaccine. Advised may also receive vaccine at local pharmacy or Health Dept. Verbalized acceptance and understanding.  Screening Tests Health Maintenance  Topic Date Due   OPHTHALMOLOGY EXAM  Never done   MAMMOGRAM  02/18/2018   DEXA SCAN  10/30/2018   DTaP/Tdap/Td (1 - Tdap) 10/31/2019   COVID-19 Vaccine (3 - Pfizer risk series) 12/18/2019   Zoster Vaccines- Shingrix (2 of 2) 07/29/2022   Diabetic kidney evaluation - Urine ACR  06/04/2023   HEMOGLOBIN A1C  10/09/2023   Diabetic kidney evaluation - eGFR measurement  12/08/2023   FOOT EXAM  04/10/2024   Medicare Annual Wellness (AWV)  04/16/2024   Colonoscopy  05/01/2031   Pneumonia Vaccine 52+ Years old  Completed   INFLUENZA VACCINE  Completed   Hepatitis C Screening  Completed   HPV VACCINES  Aged Out    Health Maintenance  Health Maintenance Due  Topic Date Due   OPHTHALMOLOGY EXAM  Never done   MAMMOGRAM  02/18/2018   DEXA SCAN  10/30/2018   DTaP/Tdap/Td (1 - Tdap) 10/31/2019   COVID-19 Vaccine (3 - Pfizer risk series) 12/18/2019   Zoster Vaccines- Shingrix (2 of 2) 07/29/2022    Colorectal cancer screening: Type of screening: Colonoscopy. Completed 04/30/2021. Repeat every 10 years  Mammogram: Scheduled 04/21/2023  Bone Density status: Completed 04/23/2004. Results reflect: Bone density results: OSTEOPOROSIS. Repeat every 2 years.  Lung Cancer Screening: (Low Dose CT Chest recommended if Age 66-80 years, 20 pack-year currently smoking OR have  quit w/in 15years.) does qualify.   Lung Cancer Screening Referral:   Additional Screening:  Hepatitis C Screening: does qualify; Completed 06/03/2022  Vision Screening: Recommended annual ophthalmology exams for early detection of glaucoma and other disorders of the eye. Is the patient up to date with their annual eye exam?  Yes  Who is the provider or what is the name of the office in which the patient attends annual eye exams? [facility] If pt is not established with a provider, would they like to be referred to a provider to establish care? No .   Dental Screening: Recommended annual dental exams for proper oral hygiene  Diabetic Foot Exam: Diabetic Foot Exam: Completed 04/11/2023  Community Resource Referral / Chronic Care Management: CRR required this visit?  No   CCM required this visit?  No     Plan:     I have personally reviewed and noted the following in the patient's chart:   Medical and social history Use of alcohol, tobacco or illicit drugs  Current medications and supplements including opioid prescriptions. Patient is currently taking opioid prescriptions. Information provided to patient regarding non-opioid alternatives. Patient advised to discuss non-opioid treatment plan with their provider. Functional ability and status Nutritional status Physical activity Advanced directives List of other physicians Hospitalizations, surgeries, and ER visits in previous 12 months Vitals Screenings to include cognitive, depression, and falls Referrals and appointments  In addition, I have reviewed and discussed with patient certain preventive protocols, quality metrics, and best practice recommendations. A written personalized care plan for preventive services as well as general preventive health recommendations were provided to patient.  Telford Nab, CMA   04/17/2023   After Visit Summary: (Mail) Due to this being a telephonic visit, the after  visit summary with patients personalized plan was offered to patient via mail   Nurse Notes:  Kayla Ryan , Thank you for taking time to come for your Medicare Wellness Visit. I appreciate your ongoing commitment to your health goals. Please review the following plan we discussed and let me know if I can assist you in the future.   These are the goals we discussed:  Goals      Patient Stated     Lose weight.     Patient Stated     Get teeth.        This is a list of the screening recommended for you and due dates:  Health Maintenance  Topic Date Due   Eye exam for diabetics  Never done   Mammogram  02/18/2018   DEXA scan (bone density measurement)  10/30/2018   DTaP/Tdap/Td vaccine (1 - Tdap) 10/31/2019   COVID-19 Vaccine (3 - Pfizer risk series) 12/18/2019   Zoster (Shingles) Vaccine (2 of 2) 07/29/2022   Yearly kidney health urinalysis for diabetes  06/04/2023   Hemoglobin A1C  10/09/2023   Yearly kidney function blood test for diabetes  12/08/2023   Complete foot exam   04/10/2024   Medicare Annual Wellness Visit  04/16/2024   Colon Cancer Screening  05/01/2031   Pneumonia Vaccine  Completed   Flu Shot  Completed   Hepatitis C Screening  Completed   HPV Vaccine  Aged Out

## 2023-04-17 NOTE — Patient Instructions (Signed)

## 2023-04-17 NOTE — Telephone Encounter (Signed)
Patient states needs new RX for Singulair. Pharmacy is [facility] [address] [place]. Patient phone number is [phone].

## 2023-04-18 ENCOUNTER — Telehealth: Payer: Self-pay | Admitting: Internal Medicine

## 2023-04-18 NOTE — Telephone Encounter (Signed)
Patient checking on RX for Singulair. Patient phone number is [phone].

## 2023-04-18 NOTE — Telephone Encounter (Signed)
Patient advised.

## 2023-04-18 NOTE — Telephone Encounter (Signed)
Patient called in requesting a vall back  Had a bad night \ Muscled felt like shirinking up  Body felt like it was on fire had t o apply 2 doses of lidocaine to feet  Has stopped taking  gemfibrozil (LOPID) tablet 600 mg   [[id number]]  metFORMIN (GLUCOPHAGE) 500 MG tablet [[id number]]  Wondering if side effects from not taking medication   Wants a cll back

## 2023-04-19 ENCOUNTER — Inpatient Hospital Stay (HOSPITAL_COMMUNITY): Admission: RE | Admit: 2023-04-19 | Payer: 59 | Source: Ambulatory Visit

## 2023-04-19 ENCOUNTER — Telehealth: Payer: Self-pay | Admitting: *Deleted

## 2023-04-19 MED ORDER — MONTELUKAST SODIUM 10 MG PO TABS: 10.0000 mg | ORAL_TABLET | Freq: Every day | ORAL | 3 refills | Status: DC

## 2023-04-19 NOTE — Progress Notes (Signed)
  Care Coordination   Note   04/19/2023 Name: Kayla Ryan MRN: [id number] DOB: Nov 25, 1955  [name] is a 67 y.o. year old female who sees Anabel Halon, MD for primary care. I reached out to [name] by phone today to offer care coordination services.  [name] was given information about Care Coordination services today including:   The Care Coordination services include support from the care team which includes your Nurse Coordinator, Clinical Social Worker, or Pharmacist.  The Care Coordination team is here to help remove barriers to the health concerns and goals most important to you. Care Coordination services are voluntary, and the patient may decline or stop services at any time by request to their care team member.   Care Coordination Consent Status: Patient agreed to services and verbal consent obtained.   Follow up plan:  Telephone appointment with care coordination team member scheduled for:  05/01/23  Encounter Outcome:  Patient Scheduled  [facility] Coordination Care Guide  Direct Dial: [phone]

## 2023-04-19 NOTE — Telephone Encounter (Signed)
I have refilled this RX, pt has been notified nfn

## 2023-04-21 ENCOUNTER — Ambulatory Visit (HOSPITAL_COMMUNITY): Payer: 59

## 2023-04-21 ENCOUNTER — Other Ambulatory Visit (HOSPITAL_COMMUNITY): Payer: 59

## 2023-04-23 DIAGNOSIS — J449 Chronic obstructive pulmonary disease, unspecified: Secondary | ICD-10-CM | POA: Diagnosis not present

## 2023-04-23 DIAGNOSIS — J9611 Chronic respiratory failure with hypoxia: Secondary | ICD-10-CM | POA: Diagnosis not present

## 2023-04-24 ENCOUNTER — Ambulatory Visit (INDEPENDENT_AMBULATORY_CARE_PROVIDER_SITE_OTHER): Payer: 59 | Admitting: Urology

## 2023-04-24 VITALS — BP 126/65 | HR 96

## 2023-04-24 DIAGNOSIS — R3911 Hesitancy of micturition: Secondary | ICD-10-CM | POA: Diagnosis not present

## 2023-04-24 DIAGNOSIS — R39198 Other difficulties with micturition: Secondary | ICD-10-CM | POA: Diagnosis not present

## 2023-04-24 MED ORDER — CIPROFLOXACIN HCL 500 MG PO TABS
500.0000 mg | ORAL_TABLET | Freq: Once | ORAL | Status: DC
Start: 2023-04-24 — End: 2023-08-14

## 2023-04-24 NOTE — Progress Notes (Unsigned)
    04/24/23  CC: No chief complaint on file.   HPI:  F/u -    1) weak stream, hesitancy - going on for many years. She has frequency and urgency. She drinks water. She has constipation. MOM helps. No medicines for bladder. She had a bladder "tuck" and rectal "tuck" in her 40s. She wears a liner but rare incontinence or with a coughing spell.    She had diabetic neuropathy. UA clear. PVR 13 ml. She has chronic bronchitis and is on home 02.    Today, seen for the above. UA negative.   Blood pressure 126/65, pulse 96. NED. A&Ox3.   No respiratory distress   Abd soft, NT, ND Normal external genitalia with patent urethral meatus Bladder and urethra palpably normal - No prolapse. No evidence of mesh or stitch   Chaperone for exam and cystoscopy - [facility]   Cystoscopy Procedure Note  Patient identification was confirmed, informed consent was obtained, and patient was prepped using Betadine solution.  Lidocaine jelly was administered per urethral meatus.    Procedure: - Flexible cystoscope introduced, without any difficulty.   - Thorough search of the bladder revealed:    normal urethral meatus    normal urothelium    no stones    no ulcers     no tumors    no urethral polyps    no trabeculation  - Ureteral orifices were normal in position and appearance.  Post-Procedure: - Patient tolerated the procedure well  Assessment/ Plan:  Urinary hesitancy - no anatomic obstruction. She needs to take her time and relax and void.    No follow-ups on file.  Jerilee Field, MD

## 2023-04-25 DIAGNOSIS — G894 Chronic pain syndrome: Secondary | ICD-10-CM | POA: Diagnosis not present

## 2023-04-25 DIAGNOSIS — M47816 Spondylosis without myelopathy or radiculopathy, lumbar region: Secondary | ICD-10-CM | POA: Diagnosis not present

## 2023-04-25 DIAGNOSIS — Z79891 Long term (current) use of opiate analgesic: Secondary | ICD-10-CM | POA: Diagnosis not present

## 2023-04-25 DIAGNOSIS — M797 Fibromyalgia: Secondary | ICD-10-CM | POA: Diagnosis not present

## 2023-04-25 LAB — URINALYSIS, ROUTINE W REFLEX MICROSCOPIC
Bilirubin, UA: NEGATIVE
Ketones, UA: NEGATIVE
Leukocytes,UA: NEGATIVE
Nitrite, UA: NEGATIVE
Protein,UA: NEGATIVE
RBC, UA: NEGATIVE
Specific Gravity, UA: 1.015 (ref 1.005–1.030)
Urobilinogen, Ur: 0.2 mg/dL (ref 0.2–1.0)
pH, UA: 7 (ref 5.0–7.5)

## 2023-05-01 ENCOUNTER — Other Ambulatory Visit: Payer: Self-pay | Admitting: Internal Medicine

## 2023-05-01 ENCOUNTER — Telehealth: Payer: Self-pay | Admitting: Emergency Medicine

## 2023-05-01 ENCOUNTER — Ambulatory Visit: Payer: Self-pay | Admitting: *Deleted

## 2023-05-01 DIAGNOSIS — E1149 Type 2 diabetes mellitus with other diabetic neurological complication: Secondary | ICD-10-CM

## 2023-05-01 DIAGNOSIS — J42 Unspecified chronic bronchitis: Secondary | ICD-10-CM

## 2023-05-01 DIAGNOSIS — I10 Essential (primary) hypertension: Secondary | ICD-10-CM

## 2023-05-01 DIAGNOSIS — D500 Iron deficiency anemia secondary to blood loss (chronic): Secondary | ICD-10-CM

## 2023-05-01 DIAGNOSIS — G729 Myopathy, unspecified: Secondary | ICD-10-CM | POA: Insufficient documentation

## 2023-05-01 DIAGNOSIS — Z8719 Personal history of other diseases of the digestive system: Secondary | ICD-10-CM | POA: Insufficient documentation

## 2023-05-01 DIAGNOSIS — G629 Polyneuropathy, unspecified: Secondary | ICD-10-CM

## 2023-05-01 NOTE — Telephone Encounter (Signed)
-----   Message from Nurse Claretha Cooper sent at 05/01/2023 11:05 AM EDT ----- Regarding: Worsening symptoms patient is having an increase in productive green sputum that she reports was darker than reported in July 2024. She voices concern Please advise. Kimberly L. Noelle Penner, RN, BSN, CCM, Care Management Coordinator [phone]

## 2023-05-01 NOTE — Patient Outreach (Signed)
Care Coordination   Initial Visit Note   05/02/2023 Name: Kayla Ryan MRN: [id number] DOB: 13-Sep-1955  Kayla Ryan is a 67 y.o. year old female who sees Anabel Halon, MD for primary care. I spoke with  [name] by phone today.  What matters to the patients health and wellness today?  Finances, mobility, broken dentures, Chronic obstructive pulmonary disease (COPD)/asthma, diabetes, peripheral neuropathy, social determinants of health (SDOH)-finances, utilities,  food   Mobility- history of falls  Coughing up green sputum, reports it is greener than when she was seen by pulmonologist in July 2024, she voices concern with the changing in color, on oxygen Agrees to allow RN CM to outreach to her pulmonology office & pcp  Broken denture plate, going to replace it when visit portable dentures in  [place] soon  Neuropathy- remains unbearable at times  SDOH- patient expresses concern during assessment of having increase cost for utilities and food. She anticipates and presently reports financial strain.  She agrees to allow Referral to Care guide for possible resources that she may not be aware of to help her    Goals Addressed             This Visit's Progress    manage diabetes, finances RN CM services   Not on track    Interventions Today    Flowsheet Row Most Recent Value  Chronic Disease   Chronic disease during today's visit Diabetes, Chronic Obstructive Pulmonary Disease (COPD), Other, Hypertension (HTN)  General Interventions   General Interventions Discussed/Reviewed General Interventions Discussed, Annual Foot Exam, [facility], Doctor Visits, Communication with, Labs, Horticulturist, commercial (DME)  Labs Hgb A1c annually  Doctor Visits Discussed/Reviewed Doctor Visits Discussed, Annual Wellness Visits, PCP, Specialist  [encouraged [employer] annual nurse home visits]  Horticulturist, commercial (DME) Glucomoter, Oxygen  PCP/Specialist  Visits Compliance with follow-up visit  Communication with PCP/Specialists  [outreach to pulmonologist, R Byrum about worsening green productive green sputum via secure chat & clinical pool]  Exercise Interventions   Exercise Discussed/Reviewed Exercise Discussed, Physical Activity  Physical Activity Discussed/Reviewed Physical Activity Discussed  Education Interventions   Education Provided --  [food insecurity, care management services, lower extremity circulation, my chart password reset, neuropathy,]  Provided Verbal Education On Foot Care, Labs, Blood Sugar Monitoring, Medication  Labs Reviewed Hgb A1c  [last HgbA1c was 5.5]  Mental Health Interventions   Mental Health Discussed/Reviewed Mental Health Discussed, Coping Strategies  Nutrition Interventions   Nutrition Discussed/Reviewed Nutrition Discussed  Pharmacy Interventions   Pharmacy Dicussed/Reviewed Pharmacy Topics Discussed, Medications and their functions, Affording Medications  Safety Interventions   Safety Discussed/Reviewed Safety Discussed, Fall Risk              SDOH assessments and interventions completed:  Yes  SDOH Interventions Today    Flowsheet Row Most Recent Value  SDOH Interventions   Food Insecurity Interventions [id number] Referral  Transportation Interventions Intervention Not Indicated  Utilities Interventions [id number] Referral  Financial Strain Interventions [id number] Referral  Health Literacy Interventions Intervention Not Indicated        Care Coordination Interventions:  Yes, provided   Follow up plan: Follow up call scheduled for 05/11/23    Encounter Outcome:  Patient Visit Completed   [name], RN, BSN, CCM, Care Management Coordinator [phone]

## 2023-05-01 NOTE — Telephone Encounter (Signed)
Please ask her to take doxycycline 100mg  bid x 7 days. If she is not improving then we need to see her in office

## 2023-05-01 NOTE — Patient Instructions (Signed)
Visit Information  Thank you for taking time to visit with me today. Please don't hesitate to contact me if I can be of assistance to you.   Following are the goals we discussed today:   Goals Addressed             This Visit's Progress    manage diabetes, finances RN CM services   Not on track    Interventions Today    Flowsheet Row Most Recent Value  Chronic Disease   Chronic disease during today's visit Diabetes, Chronic Obstructive Pulmonary Disease (COPD), Other, Hypertension (HTN)  General Interventions   General Interventions Discussed/Reviewed General Interventions Discussed, Annual Foot Exam, [facility], Doctor Visits, Communication with, Labs, Horticulturist, commercial (DME)  Labs Hgb A1c annually  Doctor Visits Discussed/Reviewed Doctor Visits Discussed, Annual Wellness Visits, PCP, Specialist  [encouraged [employer] annual nurse home visits]  Horticulturist, commercial (DME) Glucomoter, Oxygen  PCP/Specialist Visits Compliance with follow-up visit  Communication with PCP/Specialists  [outreach to pulmonologist, R Byrum about worsening green productive green sputum via secure chat & clinical pool]  Exercise Interventions   Exercise Discussed/Reviewed Exercise Discussed, Physical Activity  Physical Activity Discussed/Reviewed Physical Activity Discussed  Education Interventions   Education Provided --  [food insecurity, care management services, lower extremity circulation, my chart password reset, neuropathy,]  Provided Verbal Education On Foot Care, Labs, Blood Sugar Monitoring, Medication  Labs Reviewed Hgb A1c  [last HgbA1c was 5.5]  Mental Health Interventions   Mental Health Discussed/Reviewed Mental Health Discussed, Coping Strategies  Nutrition Interventions   Nutrition Discussed/Reviewed Nutrition Discussed  Pharmacy Interventions   Pharmacy Dicussed/Reviewed Pharmacy Topics Discussed, Medications and their functions, Affording Medications   Safety Interventions   Safety Discussed/Reviewed Safety Discussed, Fall Risk              Our next appointment is by telephone on 05/11/23 at 1130  Please call the care guide team at [phone] if you need to cancel or reschedule your appointment.   If you are experiencing a Mental Health or Behavioral Health Crisis or need someone to talk to, please call the Suicide and Crisis Lifeline: 988 call the [place] National Suicide Prevention Lifeline: (304)683-0849 or TTY: 380-736-6174 TTY (334) 538-4096) to talk to a trained counselor call 1-800-273-TALK (toll free, 24 hour hotline) call the [facility]: 332-269-2796 call 911   Patient verbalizes understanding of instructions and care plan provided today and agrees to view in MyChart. Active MyChart status and patient understanding of how to access instructions and care plan via MyChart confirmed with patient.     The patient has been provided with contact information for the care management team and has been advised to call with any health related questions or concerns.   Cassidie Veiga L. Noelle Penner, RN, BSN, CCM, Care Management Coordinator [phone]

## 2023-05-02 MED ORDER — DOXYCYCLINE HYCLATE 100 MG PO TABS: 100.0000 mg | ORAL_TABLET | Freq: Two times a day (BID) | ORAL | 0 refills | Status: DC

## 2023-05-02 NOTE — Telephone Encounter (Signed)
Called and spoke with patient, advised of recommendations per Dr. Delton Coombes.  Script sent to pharmacy after verifying pharmacy.   She verbalized understanding.  Nothing further needed.

## 2023-05-04 ENCOUNTER — Telehealth: Payer: Self-pay | Admitting: Internal Medicine

## 2023-05-04 ENCOUNTER — Other Ambulatory Visit: Payer: Self-pay

## 2023-05-04 MED ORDER — DULOXETINE HCL 60 MG PO CPEP: 60.0000 mg | ORAL_CAPSULE | Freq: Two times a day (BID) | ORAL | 3 refills | Status: DC

## 2023-05-04 NOTE — Telephone Encounter (Signed)
Patient called need med refills no longer gets from older pcp  Prescription Request  05/04/2023  LOV: 04/11/2023  What is the name of the medication or equipment? DULoxetine (CYMBALTA) 60 MG capsule   Have you contacted your pharmacy to request a refill? No   Which pharmacy would you like this sent to?  [id number] - [place], [place] - [address] [place] [address] Phone: [phone] Fax: [phone]    Patient notified that their request is being sent to the clinical staff for review and that they should receive a response within 2 business days.   Please advise at Mobile [phone] (mobile)

## 2023-05-04 NOTE — Telephone Encounter (Signed)
Refills sent to pharmacy. 

## 2023-05-08 ENCOUNTER — Ambulatory Visit (HOSPITAL_COMMUNITY): Payer: 59

## 2023-05-08 ENCOUNTER — Encounter (HOSPITAL_COMMUNITY): Payer: Self-pay

## 2023-05-08 ENCOUNTER — Other Ambulatory Visit (HOSPITAL_COMMUNITY): Payer: 59

## 2023-05-08 DIAGNOSIS — J439 Emphysema, unspecified: Secondary | ICD-10-CM | POA: Diagnosis not present

## 2023-05-08 DIAGNOSIS — J449 Chronic obstructive pulmonary disease, unspecified: Secondary | ICD-10-CM | POA: Diagnosis not present

## 2023-05-09 ENCOUNTER — Ambulatory Visit: Payer: Self-pay

## 2023-05-09 NOTE — Patient Outreach (Signed)
Care Coordination   Initial Visit Note   05/09/2023 Name: Kayla Ryan MRN: [id number] DOB: 09-Feb-1956  Kayla Ryan is a 67 y.o. year old female who sees Anabel Halon, MD for primary care. I spoke with  Kayla Ryan by phone today.  What matters to the patients health and wellness today?  Patient request extra help with resources for food and financial assistance.    Goals Addressed             This Visit's Progress    Care Coordination Activities       Interventions Today    Flowsheet Row Most Recent Value  Chronic Disease   Chronic disease during today's visit Diabetes, Chronic Obstructive Pulmonary Disease (COPD), Hypertension (HTN)  General Interventions   General Interventions Discussed/Reviewed General Interventions Discussed, General Interventions Reviewed, [employer] reports needing extra food. Pt uses [employer] $326 for food and supplies. Utility bill on EPP Plan and last bill $172 had overage from the year end. Regular bill is $77 but will increase a little.Sw to provider list of food banks and financial resources.]              SDOH assessments and interventions completed:  Yes  SDOH Interventions Today    Flowsheet Row Most Recent Value  SDOH Interventions   Food Insecurity Interventions Intervention Not Indicated, Other (Comment)  [Has [employer] from insurance for food]  Housing Interventions Intervention Not Indicated  Transportation Interventions Intervention Not Indicated, Other (Comment)  [Drives and uses insurance transportation]  Utilities Interventions Intervention Not Indicated        Care Coordination Interventions:  Yes, provided   Follow up plan: Follow up call scheduled for 05/23/23 at 11:30    Encounter Outcome:  Patient Visit Completed

## 2023-05-09 NOTE — Patient Instructions (Signed)
Visit Information  Thank you for taking time to visit with me today. Please don't hesitate to contact me if I can be of assistance to you.   Following are the goals we discussed today:  SW to provide a list of food banks and financial assistance programs in the community. Patient will contact resources for assistance.   Our next appointment is by telephone on 05/23/23 at 11:30 am  Please call the care guide team at [phone] if you need to cancel or reschedule your appointment.   If you are experiencing a Mental Health or Behavioral Health Crisis or need someone to talk to, please call 911  Patient verbalizes understanding of instructions and care plan provided today and agrees to view in MyChart. Active MyChart status and patient understanding of how to access instructions and care plan via MyChart confirmed with patient.     Telephone follow up appointment with care management team member scheduled for:05/23/23 at 11:30 am.  Lysle Morales, BSW Social Worker2 [phone]

## 2023-05-09 NOTE — Telephone Encounter (Signed)
-----   Message from Nurse Claretha Cooper sent at 05/01/2023 11:05 AM EDT ----- Regarding: Worsening symptoms patient is having an increase in productive green sputum that she reports was darker than reported in July 2024. She voices concern Please advise. Kimberly L. Noelle Penner, RN, BSN, CCM, Care Management Coordinator [phone]

## 2023-05-11 ENCOUNTER — Ambulatory Visit: Payer: Self-pay | Admitting: *Deleted

## 2023-05-11 NOTE — Patient Outreach (Addendum)
Care Coordination   Follow Up Visit Note   05/25/2023 Name: Kayla Ryan MRN: [id number] DOB: 25-May-1956  Kayla Ryan is a 67 y.o. year old female who sees Anabel Halon, MD for primary care. I spoke with  [name] by phone today.  What matters to the patients health and wellness today?  Her bentyl causes constipation and can not   Finances better got her electric bill resolved changed some of her bills Green sputum resolved after doxycycline History Chronic obstructive pulmonary disease (COPD) , pneumonia, allergies   Still awaiting authorization for teeth, then need new dentures See provider 10 only 5 teeth Dr Yetta Barre affordable in [place] seen last week Barbette Merino to be seen on the 05/17/23   Goals Addressed             This Visit's Progress    manage diabetes, finances RN CM services       Interventions Today    Flowsheet Row Most Recent Value  Chronic Disease   Chronic disease during today's visit Chronic Obstructive Pulmonary Disease (COPD), Other  [diarrhea without blood >3 times Just finished with doxycycline which has not help Has had nausea]  General Interventions   General Interventions Discussed/Reviewed General Interventions Reviewed  Doctor Visits Discussed/Reviewed Doctor Visits Reviewed, PCP, Specialist  PCP/Specialist Visits Compliance with follow-up visit  Communication with PCP/Specialists  [pcp about medicine for diarrhea]  Exercise Interventions   Exercise Discussed/Reviewed Exercise Reviewed, Physical Activity, Assistive device use and maintanence  Physical Activity Discussed/Reviewed Physical Activity Reviewed, Home Exercise Program (HEP)  Education Interventions   Education Provided --  [importance of outreaching to providers on sick days and not awaiting for scheduled appointments (last infusion did not work as Well)]  Provided Engineer, petroleum On Nutrition, Mental Health/Coping with Illness, Exercise, Medication, Sick Day Rules, [employer], Development worker, community  Mental Health Interventions   Mental Health Discussed/Reviewed Mental Health Reviewed, Coping Strategies  Nutrition Interventions   Nutrition Discussed/Reviewed Nutrition Reviewed, Fluid intake, Increasing proteins  Pharmacy Interventions   Pharmacy Dicussed/Reviewed Pharmacy Topics Reviewed, Medications and their functions, Affording Medications  [doxycycline & bentyl use, medicine dosage for diarrhea not leading to constipation]              SDOH assessments and interventions completed:  Yes  SDOH Interventions Today    Flowsheet Row Most Recent Value  SDOH Interventions   Transportation Interventions Intervention Not Indicated  Stress Interventions Intervention Not Indicated  Health Literacy Interventions Intervention Not Indicated        Care Coordination Interventions:  Yes, provided   Follow up plan: Follow up call scheduled for 05/25/23    Encounter Outcome:  Patient Visit Completed   [name], RN, BSN, CCM, Care Management Coordinator [phone]

## 2023-05-11 NOTE — Patient Instructions (Addendum)
Visit Information  Thank you for taking time to visit with me today. Please don't hesitate to contact me if I can be of assistance to you.   Following are the goals we discussed today:   Goals Addressed             This Visit's Progress    manage diabetes, finances RN CM services       Interventions Today    Flowsheet Row Most Recent Value  Chronic Disease   Chronic disease during today's visit Chronic Obstructive Pulmonary Disease (COPD), Other  [diarrhea without blood >3 times Just finished with doxycycline which has not help Has had nausea]  General Interventions   General Interventions Discussed/Reviewed General Interventions Reviewed  Doctor Visits Discussed/Reviewed Doctor Visits Reviewed, PCP, Specialist  PCP/Specialist Visits Compliance with follow-up visit  Communication with PCP/Specialists  [pcp about medicine for diarrhea]  Exercise Interventions   Exercise Discussed/Reviewed Exercise Reviewed, Physical Activity, Assistive device use and maintanence  Physical Activity Discussed/Reviewed Physical Activity Reviewed, Home Exercise Program (HEP)  Education Interventions   Education Provided --  [importance of outreaching to providers on sick days and not awaiting for scheduled appointments (last infusion did not work as Well)]  Provided Engineer, petroleum On Nutrition, Mental Health/Coping with Illness, Exercise, Medication, Sick Day Rules, [facility], Development worker, community  Mental Health Interventions   Mental Health Discussed/Reviewed Mental Health Reviewed, Coping Strategies  Nutrition Interventions   Nutrition Discussed/Reviewed Nutrition Reviewed, Fluid intake, Increasing proteins  Pharmacy Interventions   Pharmacy Dicussed/Reviewed Pharmacy Topics Reviewed, Medications and their functions, Affording Medications  [doxycycline & bentyl use, medicine dosage for diarrhea not leading to constipation]              Our next appointment is by telephone on 05/25/23  at 1130  Please call the care guide team at [phone] if you need to cancel or reschedule your appointment.   If you are experiencing a Mental Health or Behavioral Health Crisis or need someone to talk to, please call the Suicide and Crisis Lifeline: 988 call the [place] National Suicide Prevention Lifeline: 905-799-6980 or TTY: 509-455-1716 TTY 201-823-1401) to talk to a trained counselor call 1-800-273-TALK (toll free, 24 hour hotline) call the [facility]: [phone] call 911   Patient verbalizes understanding of instructions and care plan provided today and agrees to view in MyChart. Active MyChart status and patient understanding of how to access instructions and care plan via MyChart confirmed with patient.     The patient has been provided with contact information for the care management team and has been advised to call with any health related questions or concerns.   Javarie Crisp L. Noelle Penner, RN, BSN, CCM, Care Management Coordinator [phone]

## 2023-05-14 ENCOUNTER — Other Ambulatory Visit: Payer: Self-pay | Admitting: Internal Medicine

## 2023-05-14 DIAGNOSIS — M5030 Other cervical disc degeneration, unspecified cervical region: Secondary | ICD-10-CM

## 2023-05-14 DIAGNOSIS — R42 Dizziness and giddiness: Secondary | ICD-10-CM

## 2023-05-16 ENCOUNTER — Ambulatory Visit (INDEPENDENT_AMBULATORY_CARE_PROVIDER_SITE_OTHER): Payer: 59 | Admitting: Neurology

## 2023-05-16 ENCOUNTER — Encounter: Payer: Self-pay | Admitting: Neurology

## 2023-05-16 VITALS — BP 123/59 | HR 94 | Ht 68.0 in | Wt 172.0 lb

## 2023-05-16 DIAGNOSIS — R292 Abnormal reflex: Secondary | ICD-10-CM

## 2023-05-16 DIAGNOSIS — E1142 Type 2 diabetes mellitus with diabetic polyneuropathy: Secondary | ICD-10-CM

## 2023-05-16 DIAGNOSIS — G500 Trigeminal neuralgia: Secondary | ICD-10-CM

## 2023-05-16 NOTE — Progress Notes (Signed)
Kayla Ryan - Initial Visit   Date: 05/16/2023   Kayla Ryan MRN: [id number] DOB: 12-Sep-1955   Dear Dr. Allena Katz:  Thank you for your kind referral of Kayla Ryan for consultation of facial pain. Although her history is well known to you, please allow Kayla Ryan to reiterate it for the purpose of our medical record. The patient was accompanied to the clinic by self.     Kayla Ryan is a 67 y.o. 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   IMPRESSION/PLAN: Bilateral facial pain, possibly due to trigeminal neuralgia based on characteristic of pain, but it would be atypical to have bilateral involvement.    - check MRI trigeminal nerve protocol  - Check MRI cervical spine to be sure there is no high cervical stenosis given her hyperreflexia on exam  Myopathy, ?hereditary.  - She will follow-up at [facility]  Further recommendations pending results.   ------------------------------------------------------------- History of present illness: Starting in 2022, she reports having spells of electric current involving both side of the jaw and cheek.  It tends to occur in clusters and can last 1-2 days and she can go weeks or months without having her pain.  She also complains of neck pain and stiffness and has history of diabetic neuropathy.  She has is followed by pain clinic ([facility], Dr. Vear Clock) for chronic pain and takes Cymbalta 60mg  BID, gabapentin 800mg  TID, and Norco 10/325mg  q4h.  She takes robaxin as needed.  She has been followed at [facility] for myopathy.  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  At her last visit, she underwent genetic testing, but  results were not received.    Out-side paper records, electronic medical record, and images have been reviewed where available and summarized as:  Lab Results  Component Value Date   HGBA1C 5.5 04/11/2023   Lab Results  Component Value Date   VITAMINB12 570 05/01/2021   Lab Results  Component Value Date   TSH 3.800 09/01/2022   No results found for: "ESRSEDRATE", "POCTSEDRATE"  Past Medical History:  Diagnosis Date   Allergy Unknown   Anemia    Anxiety 1883   Arthritis    Asthma    CHF (congestive heart failure) (HCC) 2021   Chronic bronchitis    Crohn disease (HCC)    Emphysema    Emphysema of lung (HCC) 2003   Fibromyalgia    GERD (gastroesophageal reflux disease)    GI bleed    Herpes    History of blood transfusion    Hyperlipidemia    Hypertension 2015   IBS (irritable bowel syndrome)    Kidney failure    Migraines    Muscular dystrophy (HCC)    Neck pain    Oxygen deficiency 2021   PAF (paroxysmal atrial fibrillation) (HCC)    Not anticoagulated with history of severe GI bleeding   Plantar fasciitis    Polymyositis (HCC)    PONV (postoperative nausea and vomiting)    Right thyroid nodule 04/07/2022   Scoliosis    Type 2 diabetes mellitus (HCC)     Past Surgical History:  Procedure Laterality Date   ABDOMINAL HYSTERECTOMY     AGILE CAPSULE N/A 06/24/2021   Procedure: AGILE CAPSULE;  Surgeon: Lanelle Bal, DO;  [facility];  Service: Endoscopy;  Laterality: N/A;   AGILE CAPSULE N/A 07/22/2021   Procedure: AGILE CAPSULE;  Surgeon: Corbin Ade, MD;  [facility]: [facility];  Service: Endoscopy;  Laterality: N/A;  7:30am   bone spur     BREAST SURGERY  1995   CHOLECYSTECTOMY     COLONOSCOPY WITH PROPOFOL N/A 04/30/2021   Procedure: COLONOSCOPY WITH PROPOFOL;  Surgeon: Corbin Ade, MD;  [facility]: [facility];  Service: Endoscopy;  Laterality: N/A;   ESOPHAGEAL DILATION N/A 04/30/2021   Procedure: ESOPHAGEAL DILATION;   Surgeon: Corbin Ade, MD;  [facility];  Service: Endoscopy;  Laterality: N/A;   ESOPHAGOGASTRODUODENOSCOPY (EGD) WITH PROPOFOL N/A 04/30/2021   Procedure: ESOPHAGOGASTRODUODENOSCOPY (EGD) WITH PROPOFOL;  Surgeon: Corbin Ade, MD;  [facility];  Service: Endoscopy;  Laterality: N/A;   GIVENS CAPSULE STUDY N/A 09/22/2021   Procedure: GIVENS CAPSULE STUDY;  Surgeon: Corbin Ade, MD;  [facility];  Service: Endoscopy;  Laterality: N/A;  7:30am   HERNIA REPAIR     LEFT HEART CATHETERIZATION WITH CORONARY ANGIOGRAM N/A 11/07/2013   Procedure: LEFT HEART CATHETERIZATION WITH CORONARY ANGIOGRAM;  Surgeon: Runell Gess, MD;  Location: [facility] CATH LAB;  Service: Cardiovascular;  Laterality: N/A;   MASTECTOMY PARTIAL / LUMPECTOMY     OOPHORECTOMY     ROTATOR CUFF REPAIR     TOOTH EXTRACTION N/A 04/15/2022   Procedure: DENTAL RESTORATION/EXTRACTIONS;  Surgeon: Ocie Doyne, DMD;  Location: [facility] OR;  Service: Oral Surgery;  Laterality: N/A;     Medications:  Outpatient Encounter Medications as of 05/16/2023  Medication Sig   ACCU-CHEK GUIDE test strip USE TO CHECK BLOOD GLUCOSE THREE TIMES DAILY, MORNING, AT NOON, AND AT BEDTIME   Accu-Chek Softclix Lancets lancets USE TO TEST BLOOD SUGAR EVERY MORNING, AT NOON AND EVERY NIGHT AT BEDTIME   acyclovir (ZOVIRAX) 400 MG tablet TAKE 1 TABLET(400 MG) BY MOUTH THREE TIMES DAILY   ASPERCREME LIDOCAINE EX Apply topically.   aspirin EC 81 MG tablet Take 81 mg by mouth daily. Swallow whole.   cetirizine (ZYRTEC) 10 MG tablet Take 10 mg by mouth 2 (two) times daily.   Continuous Glucose Receiver (DEXCOM G7 RECEIVER) DEVI USE TO CHECK BLOOD GLUCOSE AS NEEDED   Continuous Glucose Sensor (DEXCOM G7 SENSOR) MISC USE TO CHECK BLOOD SUGAR THREE TIMES DAILY, BEFORE MEALS AND AT BEDTIME AND AS NEEDED. CHANGE SENSOR EVERY 10 DAYS   dicyclomine (BENTYL) 10 MG capsule Take 1 capsule (10 mg total) by mouth 4 (four) times daily -   before meals and at bedtime. Monitor for constipation, dry mouth, dizziness (Patient taking differently: Take 10 mg by mouth 4 (four) times daily as needed (Bowel Spasm/Diarrhea). Monitor for constipation, dry mouth, dizziness)   diphenhydrAMINE (BENADRYL) 25 MG tablet Take 25 mg by mouth every 4 (four) hours as needed for allergies or itching.   doxepin (SINEQUAN) 10 MG capsule TAKE 1 CAPSULE(10 MG) BY MOUTH AT BEDTIME   doxycycline (VIBRA-TABS) 100 MG tablet Take 1 tablet (100 mg total) by mouth 2 (two) times daily.   DULoxetine (CYMBALTA) 60 MG capsule Take 1 capsule (60 mg total) by mouth 2 (two) times daily. (Patient taking differently: Take 60 mg by mouth once. Take two capsules daily)   ezetimibe (ZETIA) 10 MG tablet Take 1 tablet (10 mg total) by mouth daily.   gabapentin (NEURONTIN) 800 MG tablet TAKE 1 TABLET(800 MG)  BY MOUTH THREE TIMES DAILY   Glucagon (GVOKE HYPOPEN 2-PACK) 1 MG/0.2ML SOAJ Inject 0.2 mLs into the skin as needed (Blood glucose < 53).   Glycerin-Hypromellose-PEG 400 (DRY EYE RELIEF DROPS OP) Place 1 drop into both eyes 3 (three) times daily as needed (Dry eye).   HYDROcodone bit-homatropine (HYCODAN) 5-1.5 MG/5ML syrup Take 5 mLs by mouth every 6 (six) hours as needed for cough.   HYDROcodone-acetaminophen (NORCO) 10-325 MG tablet Take 1 tablet by mouth every 4 (four) hours as needed. Stop Hydrocodone 7.5/325mg  tablet.   ipratropium (ATROVENT HFA) 17 MCG/ACT inhaler Inhale 2 puffs into the lungs every 4 (four) hours as needed (COPD).   isosorbide dinitrate (ISORDIL) 30 MG tablet TAKE 1 TABLET(30 MG) BY MOUTH DAILY   JARDIANCE 10 MG TABS tablet TAKE 1 TABLET(10 MG) BY MOUTH DAILY BEFORE BREAKFAST   Lancets Misc. (ACCU-CHEK SOFTCLIX LANCET DEV) KIT USE TO CHECK GLUCOSE THREE TIMES DAILY   LORazepam (ATIVAN) 0.5 MG tablet TAKE 1 TABLET(0.5 MG) BY MOUTH THREE TIMES DAILY AS NEEDED FOR ANXIETY   meclizine (ANTIVERT) 25 MG tablet TAKE 1 TABLET(25 MG) BY MOUTH THREE TIMES DAILY  AS NEEDED FOR DIZZINESS   metFORMIN (GLUCOPHAGE) 500 MG tablet TAKE 1 TABLET(500 MG) BY MOUTH TWICE DAILY WITH A MEAL   methocarbamol (ROBAXIN) 500 MG tablet TAKE 1 TABLET(500 MG) BY MOUTH EVERY 8 HOURS AS NEEDED FOR MUSCLE SPASMS   Misc. Devices KIT Henmnii rollator walker   montelukast (SINGULAIR) 10 MG tablet Take 1 tablet (10 mg total) by mouth at bedtime.   NITROSTAT 0.4 MG SL tablet Place 0.4 mg under the tongue every 15 (fifteen) minutes as needed for chest pain.    omeprazole (PRILOSEC) 40 MG capsule TAKE 1 CAPSULE(40 MG) BY MOUTH TWICE DAILY   ondansetron (ZOFRAN-ODT) 8 MG disintegrating tablet DISSOLVE 1 TABLET(8 MG) ON THE TONGUE EVERY 8 HOURS AS NEEDED FOR NAUSEA OR VOMITING   Tiotropium Bromide-Olodaterol (STIOLTO RESPIMAT) 2.5-2.5 MCG/ACT AERS Inhale 2 each into the lungs daily.   tirzepatide [facility]) 5 MG/0.5ML Pen Inject 5 mg into the skin once a week.   [DISCONTINUED] benzonatate (TESSALON) 200 MG capsule Take 1 capsule (200 mg total) by mouth every 6 (six) hours as needed for cough. (Patient not taking: Reported on 05/16/2023)   [DISCONTINUED] simethicone (GAS-X) 80 MG chewable tablet Chew 1 tablet (80 mg total) by mouth every 6 (six) hours as needed for flatulence. (Patient not taking: Reported on 05/16/2023)   Facility-Administered Encounter Medications as of 05/16/2023  Medication   ciprofloxacin (CIPRO) tablet 500 mg    Allergies:  Allergies  Allergen Reactions   Sulfa Antibiotics Shortness Of Breath, Swelling and Rash   Clindamycin/Lincomycin    Dilaudid [Hydromorphone Hcl]     Makes me crazy   Iron     Throat starts closing up, tongue swells, severe headaches and backpain   Metronidazole Diarrhea and Nausea And Vomiting   Prednisone Other (See Comments)    Angry     Statins Other (See Comments)    Unknown    Cephalexin Diarrhea and Nausea And Vomiting   Methotrexate Derivatives Swelling and Rash   Morphine And Codeine Anxiety    "exreme mood swings"     Family History: Family History  Problem Relation Age of Onset   Lung cancer Mother    Cancer Mother    Miscarriages / Stillbirths Mother    Varicose Veins Mother    Hypertension Mother    COPD Father    Lung cancer Father  Lymphoma Father    Alcohol abuse Father    Arthritis Father    Anxiety disorder Sister    Depression Brother    Anxiety disorder Sister    Intellectual disability Sister     Social History: Social History   Tobacco Use   Smoking status: Former    Current packs/day: 0.00    Average packs/day: 1 pack/day for 49.0 years (49.0 ttl pk-yrs)    Types: Cigarettes    Start date: 08/08/1966    Quit date: 08/31/2005    Years since quitting: 17.7   Smokeless tobacco: Never  Vaping Use   Vaping status: Never Used  Substance Use Topics   Alcohol use: No   Drug use: No    Comment: used marijuana in teens   Social History   Social History Narrative   Are you right handed or left handed? Left Handed    Are you currently employed ? Retired    What is your current occupation?   Do you live at home alone? Yes   Who lives with you?    What type of home do you live in: 1 story or 2 story? Lives in a one story home         Vital Signs:  BP (!) 123/59   Pulse 94   Ht 5\' 8"  (1.727 m)   Wt 172 lb (78 kg)   SpO2 93% Comment: 3 liters oxygen  BMI 26.15 kg/m    Neurological Exam: MENTAL STATUS including orientation to time, place, person, recent and remote memory, attention span and concentration, language, and fund of knowledge is normal.  Speech is not dysarthric.  CRANIAL NERVES: II:  No visual field defects.     III-IV-VI: Pupils equal round and reactive to light.  Normal conjugate, extra-ocular eye movements in all directions of gaze.  No nystagmus.  Mild right ptosis (old) V:  Normal facial sensation.    VII:  Normal facial symmetry and movements.   VIII:  Normal hearing and vestibular function.   IX-X:  Normal palatal movement.   XI:  Normal  shoulder shrug and head rotation.   XII:  Normal tongue strength and range of motion, no deviation or fasciculation.  MOTOR:  No atrophy, fasciculations or abnormal movements.  No pronator drift.   Upper Extremity:  Right  Left  Deltoid  5/5   5/5   Biceps  5/5   5/5   Triceps  5/5   5/5   Wrist extensors  5/5   5/5   Wrist flexors  5/5   5/5   Finger extensors  5/5   5/5   Finger flexors  5/5   5/5   Dorsal interossei  5/5   5/5   Tone (Ashworth scale)  0  0   Lower Extremity:  Right  Left  Hip flexors  5/5   5/5   Knee flexors  5/5   5/5   Knee extensors  5/5   5/5   Dorsiflexors  5/5   5/5   Plantarflexors  5/5   5/5   Toe extensors  5/5   5/5   Toe flexors  5/5   5/5   Tone (Ashworth scale)  0  0   MSRs:  Right        Left brachioradialis 3+  3+  biceps 3+  3+  triceps 3+  3+  patellar 3+  3+  ankle jerk 0  0  Hoffman no  no  plantar response down  down   SENSORY:  absent vibration, temperature, and pin prick below the ankles bilaterally.  Rhomberg sign is positive.   COORDINATION/GAIT: Normal finger-to- nose-finger.  Intact rapid alternating movements bilaterally.  Able to rise from a chair without using arms.  Gait mildly wide-based, somewhat unsteady, unassisted.   Thank you for allowing me to participate in patient's care.  If I can answer any additional questions, I would be pleased to do so.    Sincerely,    Maryjean Corpening K. Allena Katz, DO

## 2023-05-17 NOTE — Progress Notes (Signed)
Prior Authorization for MRI FACE [id number] no PA required. Case#:[id number]  Prior Authorization for MRI Cervical cpt: [id number] no PA reqiured. Case#:[id number].  Called patient and informed her that PA's are completed and she may now call central scheduling to get them scheduled. Patient took phone number and will give them a call to get scheduled.

## 2023-05-23 ENCOUNTER — Ambulatory Visit: Payer: Self-pay

## 2023-05-23 DIAGNOSIS — J449 Chronic obstructive pulmonary disease, unspecified: Secondary | ICD-10-CM | POA: Diagnosis not present

## 2023-05-23 DIAGNOSIS — J9611 Chronic respiratory failure with hypoxia: Secondary | ICD-10-CM | POA: Diagnosis not present

## 2023-05-23 NOTE — Patient Outreach (Signed)
Care Coordination   Follow Up Visit Note   05/23/2023 Name: Kayla Ryan MRN: [id number] DOB: 1956/02/01  Kayla Ryan is a 66 y.o. year old female who sees Kayla Halon, MD for primary care. I spoke with  Kayla Ryan by phone today.  What matters to the patients health and wellness today?  Patient has received food bank information but had not used the resources at this time.    Goals Addressed             This Visit's Progress    Care Coordination Activities       Interventions Today    Flowsheet Row Most Recent Value  General Interventions   General Interventions Discussed/Reviewed General Interventions Discussed, General Interventions Reviewed, [employer] has not used community resources for food because she has been sick and had dental issues. Plans to use the food banks in the furture. Pt expressed concern for reduced amount of [employer] in January.]              SDOH assessments and interventions completed:  No     Care Coordination Interventions:  Yes, provided   Follow up plan: No further intervention required.   Encounter Outcome:  Patient Visit Completed

## 2023-05-23 NOTE — Patient Outreach (Signed)
Care Coordination   05/23/2023 Name: Kayla Ryan MRN: [id number] DOB: 08-13-1955   Care Coordination Outreach Attempts:  An unsuccessful telephone outreach was attempted for a scheduled appointment today.  Follow Up Plan:  Additional outreach attempts will be made to offer the patient care coordination information and services.   Encounter Outcome:  No Answer   Care Coordination Interventions:  No, not indicated    SIG Lysle Morales, BSW Social Worker [phone]

## 2023-05-23 NOTE — Patient Instructions (Signed)
Visit Information  Thank you for taking time to visit with me today. Please don't hesitate to contact me if I can be of assistance to you.   Following are the goals we discussed today:  Patient will use food bank resources in the future when needed.    If you are experiencing a Mental Health or Behavioral Health Crisis or need someone to talk to, please call 911  Patient verbalizes understanding of instructions and care plan provided today and agrees to view in MyChart. Active MyChart status and patient understanding of how to access instructions and care plan via MyChart confirmed with patient.     No further follow up required: Patient does not require a follow up visit.  Lysle Morales, BSW Social Worker [phone]

## 2023-05-25 ENCOUNTER — Ambulatory Visit: Payer: Self-pay | Admitting: *Deleted

## 2023-05-25 NOTE — Patient Outreach (Signed)
Care Coordination   05/25/2023 Name: Kayla Ryan MRN: [id number] DOB: August 02, 1956   Care Coordination Outreach Attempts:  An unsuccessful telephone outreach was attempted today to offer the patient information about available care coordination services.  Follow Up Plan:  Additional outreach attempts will be made to offer the patient care coordination information and services.   Encounter Outcome:  No Answer   Care Coordination Interventions:  No, not indicated    SIG Leathie Weich L. Noelle Penner, RN, BSN, [facility]  VBCI Care Management Coordinator  [phone]  Fax: [phone]

## 2023-05-25 NOTE — Patient Outreach (Signed)
Care Coordination   05/25/2023 Name: Kayla Ryan MRN: [id number] DOB: Dec 14, 1955   Care Coordination Outreach Attempts:  An unsuccessful telephone outreach was attempted today to offer the patient information about available care coordination services.  Follow Up Plan:  Additional outreach attempts will be made to offer the patient care coordination information and services.   Encounter Outcome:  No Answer   Care Coordination Interventions:  No, not indicated     Tarri Guilfoil L. Noelle Penner, RN, BSN, [facility]  VBCI Care Management Coordinator  [phone]  Fax: [phone]

## 2023-05-26 ENCOUNTER — Ambulatory Visit: Payer: Self-pay | Admitting: *Deleted

## 2023-05-26 ENCOUNTER — Other Ambulatory Visit: Payer: Self-pay | Admitting: Internal Medicine

## 2023-05-26 ENCOUNTER — Telehealth: Payer: Self-pay | Admitting: Emergency Medicine

## 2023-05-26 DIAGNOSIS — I25118 Atherosclerotic heart disease of native coronary artery with other forms of angina pectoris: Secondary | ICD-10-CM

## 2023-05-26 NOTE — Patient Outreach (Signed)
  Care Coordination   Follow Up Visit Note   11/10/2023 updated noted for 05/26/23 Name: Kayla Ryan MRN: [id number] DOB: [birth year]  Kayla Ryan is a 67 y.o. year old female who sees Anabel Halon, MD for primary care. I spoke with  [name] by phone today.  What matters to the patients health and wellness today?  Continuous green secretions being coughed up Dr Delton Coombes placed her on doxycycline    She had a week worth of antibiotics in August 2024. She had another dose at end of September 2024 but continues to have green secretions Has not been seen by infectious disease Please advise! She does have another bottle of antibiotics at home and wants to know if she should start them?  Next pulmonary appointment is in December 2024 per the office number   Diarrhea after eating okra/tomatoes  Going to bathroom and now having pain in legs   BRAT diet for diarrhea Takes in a lot of rice at dinner Feet ice cold today  MRI  scheduled on 07/10/23 at 3 pm at [facility]    Goals Addressed             This Visit's Progress    Patient will be able to have decrease green respiratory secretions + no worsening COPD,diabetes issues/fall prevention  - RN CM services   Not on track    Patient will be able to attend all her medical appointments-   Patient will have decreased pulmonary worsening symptoms to include diarrhea  confirmed her antibiotics has cause improvements but her secretions are now reported still green She had reported it clearer on 07/10/23 on 09/04/23  Interventions Today    Flowsheet Row Most Recent Value  Chronic Disease   Chronic disease during today's visit Diabetes, Chronic Obstructive Pulmonary Disease (COPD), Hypertension (HTN), Other  [recurrent green respiratory secretions after antibiotics completed diarrhea]  General Interventions   General Interventions Discussed/Reviewed General Interventions Reviewed, Sick Day Rules, Doctor Visits  Doctor  Visits Discussed/Reviewed Doctor Visits Reviewed, PCP, Specialist  [discussed infectious disease providers]  PCP/Specialist Visits Compliance with follow-up visit  Education Interventions   Education Provided Provided Education  [discussed infectious disease providers BRAT diet]  Provided Verbal Education On Sick Day Rules, Nutrition, Medication, When to see the doctor  Mental Health Interventions   Mental Health Discussed/Reviewed Mental Health Reviewed, Coping Strategies  Nutrition Interventions   Nutrition Discussed/Reviewed Nutrition Reviewed, Adding fruits and vegetables, Fluid intake, Supplemental nutrition  [BRAT diet]  Pharmacy Interventions   Pharmacy Dicussed/Reviewed Pharmacy Topics Reviewed, Affording Medications  Safety Interventions   Safety Discussed/Reviewed Safety Reviewed, Fall Risk, Home Safety  Home Safety Assistive Devices              SDOH assessments and interventions completed:  No     Care Coordination Interventions:  Yes, provided   Follow up plan: Follow up call scheduled for 07/10/23    Encounter Outcome:  Patient Visit Completed   [name], RN, BSN, CCM [facility] RN Care Manager [phone]

## 2023-05-26 NOTE — Telephone Encounter (Signed)
Patient states still having symptoms of cough and mucus. Would like to know if she should take Doxycycline she has on hand. Patient phone number is [phone].

## 2023-05-26 NOTE — Patient Instructions (Addendum)
 Visit Information  Thank you for taking time to visit with me today. Please don't hesitate to contact me if I can be of assistance to you.   Following are the goals we discussed today:   Goals Addressed             This Visit's Progress    Patient will be able to have decrease green respiratory secretions + no worsening COPD,diabetes issues/fall prevention  - RN CM services   Not on track    Patient will be able to attend all her medical appointments-   Patient will have decreased pulmonary worsening symptoms to include diarrhea  confirmed her antibiotics has cause improvements but her secretions are now reported still green She had reported it clearer on 07/10/23 on 09/04/23  Interventions Today    Flowsheet Row Most Recent Value  Chronic Disease   Chronic disease during today's visit Diabetes, Chronic Obstructive Pulmonary Disease (COPD), Hypertension (HTN), Other  [recurrent green respiratory secretions after antibiotics completed diarrhea]  General Interventions   General Interventions Discussed/Reviewed General Interventions Reviewed, Sick Day Rules, Doctor Visits  Doctor Visits Discussed/Reviewed Doctor Visits Reviewed, PCP, Specialist  [discussed infectious disease providers]  PCP/Specialist Visits Compliance with follow-up visit  Education Interventions   Education Provided Provided Education  [discussed infectious disease providers BRAT diet]  Provided Verbal Education On Sick Day Rules, Nutrition, Medication, When to see the doctor  Mental Health Interventions   Mental Health Discussed/Reviewed Mental Health Reviewed, Coping Strategies  Nutrition Interventions   Nutrition Discussed/Reviewed Nutrition Reviewed, Adding fruits and vegetables, Fluid intake, Supplemental nutrition  [BRAT diet]  Pharmacy Interventions   Pharmacy Dicussed/Reviewed Pharmacy Topics Reviewed, Affording Medications  Safety Interventions   Safety Discussed/Reviewed Safety Reviewed, Fall Risk, Home  Safety  Home Safety Assistive Devices              Our next appointment is by telephone on 07/10/23 at 1130  Please call the care guide team at [phone] if you need to cancel or reschedule your appointment.   If you are experiencing a Mental Health or Behavioral Health Crisis or need someone to talk to, please call the Suicide and Crisis Lifeline: 988 call the [place] National Suicide Prevention Lifeline: 630-282-6639 or TTY: 415-068-0797 TTY 385-379-5620) to talk to a trained counselor call 1-800-273-TALK (toll free, 24 hour hotline) call the [facility]: 9360742015 call 911   Patient verbalizes understanding of instructions and care plan provided today and agrees to view in MyChart. Active MyChart status and patient understanding of how to access instructions and care plan via MyChart confirmed with patient.     The patient has been provided with contact information for the care management team and has been advised to call with any health related questions or concerns.   Jaymen Fetch L. Noelle Penner, RN, BSN, CCM [place]  [facility], [facility] RN Care Manager Direct Dial: [phone]  Fax: [phone]

## 2023-05-27 NOTE — Telephone Encounter (Signed)
Dr. Delton Coombes, please advise on the message from pt if you would be okay with her taking doxy that she already has to see if it would clear up her symptoms.

## 2023-05-29 NOTE — Telephone Encounter (Signed)
I spoke with the pt and notified of response per Dr Delton Coombes  She verbalized understanding  Nothing further needed

## 2023-05-29 NOTE — Telephone Encounter (Signed)
Yes ok for her to try 100mg  bid x 7 days

## 2023-05-30 ENCOUNTER — Telehealth: Payer: Self-pay | Admitting: Emergency Medicine

## 2023-05-30 ENCOUNTER — Ambulatory Visit (INDEPENDENT_AMBULATORY_CARE_PROVIDER_SITE_OTHER): Payer: 59 | Admitting: Adult Health

## 2023-05-30 ENCOUNTER — Encounter: Payer: Self-pay | Admitting: Adult Health

## 2023-05-30 VITALS — BP 128/62 | HR 92 | Ht 68.0 in | Wt 169.8 lb

## 2023-05-30 DIAGNOSIS — J439 Emphysema, unspecified: Secondary | ICD-10-CM

## 2023-05-30 DIAGNOSIS — J309 Allergic rhinitis, unspecified: Secondary | ICD-10-CM | POA: Diagnosis not present

## 2023-05-30 DIAGNOSIS — J9611 Chronic respiratory failure with hypoxia: Secondary | ICD-10-CM | POA: Diagnosis not present

## 2023-05-30 DIAGNOSIS — R918 Other nonspecific abnormal finding of lung field: Secondary | ICD-10-CM | POA: Diagnosis not present

## 2023-05-30 DIAGNOSIS — J449 Chronic obstructive pulmonary disease, unspecified: Secondary | ICD-10-CM | POA: Diagnosis not present

## 2023-05-30 MED ORDER — BUDESONIDE 0.5 MG/2ML IN SUSP: 0.5000 mg | Freq: Two times a day (BID) | RESPIRATORY_TRACT | 12 refills | Status: DC

## 2023-05-30 MED ORDER — ALBUTEROL SULFATE (2.5 MG/3ML) 0.083% IN NEBU: 2.5000 mg | INHALATION_SOLUTION | Freq: Four times a day (QID) | RESPIRATORY_TRACT | 5 refills | Status: DC | PRN

## 2023-05-30 NOTE — Telephone Encounter (Signed)
Pt need appt within 4-6 weeks w/ Dr. Delton Coombes first avail not until Jan

## 2023-05-30 NOTE — Progress Notes (Unsigned)
@Patient  ID: Kayla Ryan, female    DOB: 1956/07/04, 67 y.o.   MRN: [id number]  Chief Complaint  Patient presents with   Follow-up    Referring provider: Anabel Halon, MD  HPI: 67 year old female former smoker with a 25-pack-year history followed for COPD with asthma/emphysema and chronic rhinitis, chronic respiratory failure on oxygen Medical history significant for inflammatory myopathy, fibromyalgia, Crohn's disease and IBS, chronic anemia, diabetes  TEST/EVENTS :  CT chest April 29, 2021 negative for PE, bilateral lower lobe consolidation left greater than right   September 2021 showed FEV1 at 44%, ratio 59, FVC 57% DLCO 57%  05/30/2023 Follow up : COPD,O2 RF  Patient returns for a 4 month follow-up.  Patient has underlying severe COPD.  Remains on Stiolto daily.  Complains of cough, congestion and wheezing . Called in Doxycycline on 10/18  currently on day 4/7 .  Has chronic allergies /rhinitis. Has constant nasal drainage.  Remains on Zyrtec Twice daily  and Singulair daily .   Remain on Oxygen 3l/m . No increased demands.    Allergies  Allergen Reactions   Sulfa Antibiotics Shortness Of Breath, Swelling and Rash   Clindamycin/Lincomycin    Dilaudid [Hydromorphone Hcl]     Makes me crazy   Iron     Throat starts closing up, tongue swells, severe headaches and backpain   Metronidazole Diarrhea and Nausea And Vomiting   Prednisone Other (See Comments)    Angry     Statins Other (See Comments)    Unknown    Cephalexin Diarrhea and Nausea And Vomiting   Methotrexate Derivatives Swelling and Rash   Morphine And Codeine Anxiety    "exreme mood swings"    Immunization History  Administered Date(s) Administered   Fluad Quad(high Dose 65+) 05/27/2020, 06/03/2022   Fluad Trivalent(High Dose 65+) 04/11/2023   Influenza-Unspecified 06/17/2016, 05/25/2021   PFIZER(Purple Top)SARS-COV-2 Vaccination 10/02/2019, 11/20/2019   PNEUMOCOCCAL CONJUGATE-20 09/15/2021    Tdap 10/31/2019   Zoster Recombinant(Shingrix) 06/03/2022    Past Medical History:  Diagnosis Date   Allergy Unknown   Anemia    Anxiety 1883   Arthritis    Asthma    CHF (congestive heart failure) (HCC) 2021   Chronic bronchitis    Crohn disease (HCC)    Emphysema    Emphysema of lung (HCC) 2003   Fibromyalgia    GERD (gastroesophageal reflux disease)    GI bleed    Herpes    History of blood transfusion    Hyperlipidemia    Hypertension 2015   IBS (irritable bowel syndrome)    Kidney failure    Migraines    Muscular dystrophy (HCC)    Neck pain    Oxygen deficiency 2021   PAF (paroxysmal atrial fibrillation) (HCC)    Not anticoagulated with history of severe GI bleeding   Plantar fasciitis    Polymyositis (HCC)    PONV (postoperative nausea and vomiting)    Right thyroid nodule 04/07/2022   Scoliosis    Type 2 diabetes mellitus (HCC)     Tobacco History: Social History   Tobacco Use  Smoking Status Former   Current packs/day: 0.00   Average packs/day: 1 pack/day for 49.0 years (49.0 ttl pk-yrs)   Types: Cigarettes   Start date: 08/08/1966   Quit date: 08/31/2005   Years since quitting: 17.7  Smokeless Tobacco Never   Counseling given: Not Answered   Outpatient Medications Prior to Visit  Medication Sig Dispense Refill   ACCU-CHEK GUIDE  test strip USE TO CHECK BLOOD GLUCOSE THREE TIMES DAILY, MORNING, AT NOON, AND AT BEDTIME 100 strip 0   Accu-Chek Softclix Lancets lancets USE TO TEST BLOOD SUGAR EVERY MORNING, AT NOON AND EVERY NIGHT AT BEDTIME 100 each 0   acyclovir (ZOVIRAX) 400 MG tablet TAKE 1 TABLET(400 MG) BY MOUTH THREE TIMES DAILY 30 tablet 3   ASPERCREME LIDOCAINE EX Apply topically.     aspirin EC 81 MG tablet Take 81 mg by mouth daily. Swallow whole.     cetirizine (ZYRTEC) 10 MG tablet Take 10 mg by mouth 2 (two) times daily.     Continuous Glucose Receiver (DEXCOM G7 RECEIVER) DEVI USE TO CHECK BLOOD GLUCOSE AS NEEDED 1 each 0    Continuous Glucose Sensor (DEXCOM G7 SENSOR) MISC USE TO CHECK BLOOD SUGAR THREE TIMES DAILY, BEFORE MEALS AND AT BEDTIME AND AS NEEDED. CHANGE SENSOR EVERY 10 DAYS 3 each 5   dicyclomine (BENTYL) 10 MG capsule Take 1 capsule (10 mg total) by mouth 4 (four) times daily -  before meals and at bedtime. Monitor for constipation, dry mouth, dizziness (Patient taking differently: Take 10 mg by mouth 4 (four) times daily as needed (Bowel Spasm/Diarrhea). Monitor for constipation, dry mouth, dizziness) 120 capsule 3   diphenhydrAMINE (BENADRYL) 25 MG tablet Take 25 mg by mouth every 4 (four) hours as needed for allergies or itching.     doxepin (SINEQUAN) 10 MG capsule TAKE 1 CAPSULE(10 MG) BY MOUTH AT BEDTIME 30 capsule 3   doxycycline (VIBRA-TABS) 100 MG tablet Take 1 tablet (100 mg total) by mouth 2 (two) times daily. 14 tablet 0   DULoxetine (CYMBALTA) 60 MG capsule Take 1 capsule (60 mg total) by mouth 2 (two) times daily. (Patient taking differently: Take 60 mg by mouth once. Take two capsules daily) 60 capsule 3   ezetimibe (ZETIA) 10 MG tablet Take 1 tablet (10 mg total) by mouth daily. 90 tablet 3   gabapentin (NEURONTIN) 800 MG tablet TAKE 1 TABLET(800 MG) BY MOUTH THREE TIMES DAILY 90 tablet 5   Glucagon (GVOKE HYPOPEN 2-PACK) 1 MG/0.2ML SOAJ Inject 0.2 mLs into the skin as needed (Blood glucose < 53). 0.4 mL 5   Glycerin-Hypromellose-PEG 400 (DRY EYE RELIEF DROPS OP) Place 1 drop into both eyes 3 (three) times daily as needed (Dry eye).     HYDROcodone bit-homatropine (HYCODAN) 5-1.5 MG/5ML syrup Take 5 mLs by mouth every 6 (six) hours as needed for cough. 120 mL 0   HYDROcodone-acetaminophen (NORCO) 10-325 MG tablet Take 1 tablet by mouth every 4 (four) hours as needed. Stop Hydrocodone 7.5/325mg  tablet. 180 tablet 0   ipratropium (ATROVENT HFA) 17 MCG/ACT inhaler Inhale 2 puffs into the lungs every 4 (four) hours as needed (COPD). 1 each 4   isosorbide dinitrate (ISORDIL) 30 MG tablet TAKE 1  TABLET(30 MG) BY MOUTH DAILY 90 tablet 1   JARDIANCE 10 MG TABS tablet TAKE 1 TABLET(10 MG) BY MOUTH DAILY BEFORE BREAKFAST 30 tablet 5   Lancets Misc. (ACCU-CHEK SOFTCLIX LANCET DEV) KIT USE TO CHECK GLUCOSE THREE TIMES DAILY 1 kit 0   LORazepam (ATIVAN) 0.5 MG tablet TAKE 1 TABLET(0.5 MG) BY MOUTH THREE TIMES DAILY AS NEEDED FOR ANXIETY 90 tablet 3   meclizine (ANTIVERT) 25 MG tablet TAKE 1 TABLET(25 MG) BY MOUTH THREE TIMES DAILY AS NEEDED FOR DIZZINESS 30 tablet 1   metFORMIN (GLUCOPHAGE) 500 MG tablet TAKE 1 TABLET(500 MG) BY MOUTH TWICE DAILY WITH A MEAL 60 tablet 2  methocarbamol (ROBAXIN) 500 MG tablet TAKE 1 TABLET(500 MG) BY MOUTH EVERY 8 HOURS AS NEEDED FOR MUSCLE SPASMS 30 tablet 1   Misc. Devices KIT Henmnii rollator walker 1 kit 0   montelukast (SINGULAIR) 10 MG tablet Take 1 tablet (10 mg total) by mouth at bedtime. 30 tablet 3   NITROSTAT 0.4 MG SL tablet Place 0.4 mg under the tongue every 15 (fifteen) minutes as needed for chest pain.      omeprazole (PRILOSEC) 40 MG capsule TAKE 1 CAPSULE(40 MG) BY MOUTH TWICE DAILY 180 capsule 0   ondansetron (ZOFRAN-ODT) 8 MG disintegrating tablet DISSOLVE 1 TABLET(8 MG) ON THE TONGUE EVERY 8 HOURS AS NEEDED FOR NAUSEA OR VOMITING 257 tablet 1   Tiotropium Bromide-Olodaterol (STIOLTO RESPIMAT) 2.5-2.5 MCG/ACT AERS Inhale 2 each into the lungs daily. 4 g 11   tirzepatide (MOUNJARO) 5 MG/0.5ML Pen Inject 5 mg into the skin once a week. 6 mL 1   Facility-Administered Medications Prior to Visit  Medication Dose Route Frequency Provider Last Rate Last Admin   ciprofloxacin (CIPRO) tablet 500 mg  500 mg Oral Once Jerilee Field, MD         Review of Systems:   Constitutional:   No  weight loss, night sweats,  Fevers, chills, fatigue, or  lassitude.  HEENT:   No headaches,  Difficulty swallowing,  Tooth/dental problems, or  Sore throat,                No sneezing, itching, ear ache, nasal congestion, post nasal drip,   CV:  No chest  pain,  Orthopnea, PND, swelling in lower extremities, anasarca, dizziness, palpitations, syncope.   GI  No heartburn, indigestion, abdominal pain, nausea, vomiting, diarrhea, change in bowel habits, loss of appetite, bloody stools.   Resp: No shortness of breath with exertion or at rest.  No excess mucus, no productive cough,  No non-productive cough,  No coughing up of blood.  No change in color of mucus.  No wheezing.  No chest wall deformity  Skin: no rash or lesions.  GU: no dysuria, change in color of urine, no urgency or frequency.  No flank pain, no hematuria   MS:  No joint pain or swelling.  No decreased range of motion.  No back pain.    Physical Exam  There were no vitals taken for this visit.  GEN: A/Ox3; pleasant , NAD, well nourished    HEENT:  [place]/AT,  EACs-clear, TMs-wnl, NOSE-clear, THROAT-clear, no lesions, no postnasal drip or exudate noted.   NECK:  Supple w/ fair ROM; no JVD; normal carotid impulses w/o bruits; no thyromegaly or nodules palpated; no lymphadenopathy.    RESP  Clear  P & A; w/o, wheezes/ rales/ or rhonchi. no accessory muscle use, no dullness to percussion  CARD:  RRR, no m/r/g, no peripheral edema, pulses intact, no cyanosis or clubbing.  GI:   Soft & nt; nml bowel sounds; no organomegaly or masses detected.   Musco: Warm bil, no deformities or joint swelling noted.   Neuro: alert, no focal deficits noted.    Skin: Warm, no lesions or rashes    Lab Results:  CBC    Component Value Date/Time   WBC 7.6 03/13/2023 1228   RBC 4.34 03/13/2023 1228   HGB 11.0 (L) 03/13/2023 1228   HGB 12.2 12/08/2022 1547   HCT 37.0 03/13/2023 1228   HCT 37.7 12/08/2022 1547   PLT 508 (H) 03/13/2023 1228   PLT 474 (H) 12/08/2022 1547  MCV 85.3 03/13/2023 1228   MCV 91 12/08/2022 1547   MCH 25.3 (L) 03/13/2023 1228   MCHC 29.7 (L) 03/13/2023 1228   RDW 14.3 03/13/2023 1228   RDW 12.2 12/08/2022 1547   LYMPHSABS 2.2 03/13/2023 1228   LYMPHSABS 2.1  12/08/2022 1547   MONOABS 0.7 03/13/2023 1228   EOSABS 0.5 03/13/2023 1228   EOSABS 0.6 (H) 12/08/2022 1547   BASOSABS 0.1 03/13/2023 1228   BASOSABS 0.1 12/08/2022 1547    BMET    Component Value Date/Time   NA 140 12/08/2022 1547   K 4.7 12/08/2022 1547   CL 97 12/08/2022 1547   CO2 23 12/08/2022 1547   GLUCOSE 85 12/08/2022 1547   GLUCOSE 97 04/15/2022 0809   BUN 39 (H) 12/08/2022 1547   CREATININE 1.00 12/08/2022 1547   CREATININE 0.88 10/30/2013 1546   CALCIUM 10.0 12/08/2022 1547   GFRNONAA 42 (L) 04/15/2022 0809   GFRAA 58 (L) 01/03/2020 0813    BNP    Component Value Date/Time   BNP 119.0 (H) 06/23/2021 1606    ProBNP    Component Value Date/Time   PROBNP 73.0 01/28/2021 1329    Imaging: No results found.  acetaminophen (TYLENOL) tablet 650 mg     Date Action Dose Route User   04/03/2023 1401 Given 650 mg Oral Mickie Bail, RN      cetirizine Marcos Eke) injection 10 mg     Date Action Dose Route User   04/03/2023 1404 Given 10 mg Intravenous Mickie Bail, RN      famotidine (PEPCID) IVPB 20 mg premix     Date Action Dose Route User   04/03/2023 1404 New Bag/Given 20 mg Intravenous Mickie Bail, RN      ferumoxytol [facility]) 510 mg in sodium chloride 0.9 % 100 mL IVPB     Date Action Dose Route User   04/03/2023 1508 Rate/Dose Change (none) Intravenous Lala Lund, RN   04/03/2023 1452 New Bag/Given 510 mg Intravenous Mickie Bail, RN      ondansetron [facility]) injection 8 mg     Date Action Dose Route User   04/03/2023 1405 Given 8 mg Intravenous Mickie Bail, RN      0.9 %  sodium chloride infusion     Date Action Dose Route User   04/03/2023 1536 Rate/Dose Change (none) Intravenous Lala Lund, RN   04/03/2023 1451 Infusion Verify (none) Intravenous Lala Lund, RN   04/03/2023 1421 Rate/Dose Change (none) Intravenous Lala Lund, RN   04/03/2023 1420 Infusion Verify (none) Intravenous Lala Lund, RN    04/03/2023 1419 Infusion Verify (none) Intravenous Lala Lund, RN          Latest Ref Rng & Units 04/28/2020    3:09 PM  PFT Results  FVC-Pre L 2.15   FVC-Predicted Pre % 57   Pre FEV1/FVC % % 59   FEV1-Pre L 1.27   FEV1-Predicted Pre % 44   DLCO uncorrected ml/min/mmHg 13.31   DLCO UNC% % 57   DLCO corrected ml/min/mmHg 13.31   DLCO COR %Predicted % 57   DLVA Predicted % 77     No results found for: "NITRICOXIDE"      Assessment & Plan:   No problem-specific Assessment & Plan notes found for this encounter.     Rubye Oaks, NP 05/30/2023

## 2023-05-30 NOTE — Patient Instructions (Addendum)
Finish Doxycycline as directed.  Continue on Stiolto 2 puffs daily, rinse after use Begin Pulmicort neb Twice daily  .  Albuterol inhaler or neb As needed   Order for new nebulizer  Liquid Mucinex DM Twice daily  As needed  cough/congestion  Activity as tolerated.  Continue on Oxygen 3l/m .  CT chest without contrast Add Chlorpheniramine 4mg  At bedtime  As needed  drainage . (Chlor tabs)  Follow-up in the office in  4-6 weeks with Dr. Delton Coombes  And As needed   Please contact office for sooner follow up if symptoms do not improve or worsen or seek emergency care

## 2023-06-01 ENCOUNTER — Other Ambulatory Visit: Payer: Self-pay | Admitting: *Deleted

## 2023-06-01 ENCOUNTER — Telehealth: Payer: Self-pay | Admitting: *Deleted

## 2023-06-01 NOTE — Assessment & Plan Note (Signed)
Previous CT chest September 2022 showed bilateral consolidation left greater than right.,  Smoking history-will repeat CT chest to rule out underlying nodularity

## 2023-06-01 NOTE — Telephone Encounter (Signed)
Patient has been calling the office wanting to get into see Dr. Delton Coombes for surgical clearance for a dental procedure to remove all of her teeth so she can get dentures.  She states she has to get it done before the end of the year because she has the money and will not have it at the first of the year to cover the procedure.  I advised her that She does not have to see Dr. Delton Coombes, she can see an app.  She saw Rubye Oaks NP on 10/21 and was to come back in 4-6 weeks.  I let her know that Tammy's first available would be 12/19.  She stated that she had to get the clearance and then get it scheduled with the dentist as well.  I let her know I would send a message to Tammy and see if we could fit her in somewhere and then call her back.  She verbalized understanding.  Tammy, please advise if we can double book this patient for surgical clearance.  She was on antibiotics when you saw her on Monday and she said the nebulizers have made a big difference and was very appreciative.  Could she potentially be video visit on a Friday afternoon since you just saw her?  Thank you.

## 2023-06-01 NOTE — Assessment & Plan Note (Signed)
Acute COPD exacerbation.  Patient is to finish up doxycycline.  Will add in Pulmicort to maximize her triple therapy maintenance regimen.  Continue on Stiolto.  Plan  Patient Instructions  Finish Doxycycline as directed.  Continue on Stiolto 2 puffs daily, rinse after use Begin Pulmicort neb Twice daily  .  Albuterol inhaler or neb As needed   Order for new nebulizer  Liquid Mucinex DM Twice daily  As needed  cough/congestion  Activity as tolerated.  Continue on Oxygen 3l/m .  CT chest without contrast Add Chlorpheniramine 4mg  At bedtime  As needed  drainage . (Chlor tabs)  Follow-up in the office in  4-6 weeks with Dr. Delton Coombes  And As needed   Please contact office for sooner follow up if symptoms do not improve or worsen or seek emergency care

## 2023-06-01 NOTE — Assessment & Plan Note (Signed)
Continue on Zyrtec and Singulair.  Add in chlor tabs at bedtime as needed

## 2023-06-01 NOTE — Telephone Encounter (Signed)
Pt Called in again to be seen ASAP for an Oral surgery clearance from Dr. Delton Coombes before the end of the year.

## 2023-06-01 NOTE — Telephone Encounter (Signed)
Lm x1 for patient.  

## 2023-06-01 NOTE — Telephone Encounter (Signed)
Pt is scheduled on 11/1 @ 2:15 video visit. Nothing further needed

## 2023-06-01 NOTE — Telephone Encounter (Signed)
Please refer to 06/01/2023 phone note.

## 2023-06-01 NOTE — Assessment & Plan Note (Addendum)
Continue on maintain O2 saturations greater than 88 to 90%

## 2023-06-01 NOTE — Telephone Encounter (Signed)
Set up for virtual clinic on 11/1 Friday pm , can make sure she is better and assess for surgical

## 2023-06-07 DIAGNOSIS — J449 Chronic obstructive pulmonary disease, unspecified: Secondary | ICD-10-CM | POA: Diagnosis not present

## 2023-06-07 DIAGNOSIS — J439 Emphysema, unspecified: Secondary | ICD-10-CM | POA: Diagnosis not present

## 2023-06-09 ENCOUNTER — Telehealth: Payer: 59 | Admitting: Adult Health

## 2023-06-09 ENCOUNTER — Encounter: Payer: Self-pay | Admitting: Adult Health

## 2023-06-09 DIAGNOSIS — R918 Other nonspecific abnormal finding of lung field: Secondary | ICD-10-CM | POA: Diagnosis not present

## 2023-06-09 DIAGNOSIS — Z01811 Encounter for preprocedural respiratory examination: Secondary | ICD-10-CM

## 2023-06-09 DIAGNOSIS — J449 Chronic obstructive pulmonary disease, unspecified: Secondary | ICD-10-CM

## 2023-06-09 DIAGNOSIS — J9611 Chronic respiratory failure with hypoxia: Secondary | ICD-10-CM

## 2023-06-09 NOTE — Progress Notes (Signed)
Virtual Visit via Video Note  I connected with Kayla Ryan on 06/09/23 at  2:15 PM EDT by a video enabled telemedicine application and verified that I am speaking with the correct person using two identifiers.  Location: Patient: Home  Provider: Office    I discussed the limitations of evaluation and management by telemedicine and the availability of in person appointments. The patient expressed understanding and agreed to proceed.  History of Present Illness: 67 year old female former smoker with a 25-year pack history followed for COPD with asthma/emphysema and chronic rhinitis, chronic respiratory failure on oxygen Medical history significant for inflammatory myopathy, fibromyalgia, Crohn's disease, IBS, chronic anemia and diabetes  Today's video visit is a 1 week follow-up for COPD exacerbation and preop pulmonary risk assessment.  Patient was seen last week for COPD exacerbation.  She had been called in doxycycline.  Patient says that she is much improved.  Cough and congestion have decreased.  She was also started on budesonide but was unable to tolerate caused too much muscle pain and irritation.  We discussed remaining off this.  She remains on Stiolto daily.  Says overall her breathing is back to baseline.  She remains on oxygen 3 L.  She denies any increased oxygen demands.  Patient is planning on upcoming oral surgery to have 5 teeth extracted.  She needs a pulmonary preop risk assessment today.  She will be having her procedure done at [facility].  She did have oral surgery to have other teeth extracted 1 year ago says she did well with anesthesia with no known difficulties.  We went over her preop pulmonary risk. See below .   Previous CT chest September 2022 showed bilateral consolidation left greater than right.  With her smoking history.  A follow-up CT chest is pending.  She has been scheduled this in the near future.     Observations/Objective: CT chest April 29, 2021 negative for PE, bilateral lower lobe consolidation left greater than right    High-resolution CT chest January 26, 2021 negative for ILD, moderate emphysema, right upper lobe pulmonary nodule decreased in size   September 2021 showed FEV1 at 44%, ratio 59, FVC 57% DLCO 57%  Assessment and Plan:     Chronic Obstructive Pulmonary Disease (COPD)   COPD with recent exacerbation has shown improvement with doxycycline. However, severe muscular pain has occurred with budesonide will discontinue. Maintain daily Stiolto use.  Plan  Patient Instructions  Stop Pulmicort nebs.  Continue on Stiolto 2 puffs daily, rinse after use Albuterol inhaler or neb As needed   Liquid Mucinex DM Twice daily  As needed  cough/congestion  Activity as tolerated.  Continue on Oxygen 3l/m .  CT chest without contrast as planned  Follow-up in the office in  2-3 months and As needed  -Please call to book this appointment .      Preoperative Evaluation for Dental Surgery   She requires surgical clearance for dental surgery, which involves gum shaving and extraction of five teeth. Given her COPD and inflammatory myopathy , there is an increased risk during general anesthesia, although previous anesthesia was well-tolerated. We advised a hospital setting for the procedure due to her high-risk status and discussed the risks of general anesthesia, including the increased risk of pneumonia and the need for early mobilization post-surgery. Recommend a hospital setting for the procedure, and have discussed the risks associated with general anesthesia.  Preop pulmonary/respiratory risk assessment.  From a pulmonary standpoint patient would be a  moderate to high risk due to her underlying severe COPD and oxygen dependence. Patient has underlying severe COPD that is oxygen dependent.  She appears to be at back to baseline.   Went over pulmonary pre op risk assessment in detail.    Major Pulmonary risks identified in the  multifactorial risk analysis are but not limited to a) pneumonia; b) recurrent intubation risk; c) prolonged or recurrent acute respiratory failure needing mechanical ventilation; d) prolonged hospitalization; e) DVT/Pulmonary embolism; f) Acute Pulmonary edema   Recommend 1. Short duration of surgery as much as possible and avoid paralytic if possible.  2. Recovery in step down or ICU with Pulmonary consultation if indicated.   Aggressive pulmonary toilet with o2, bronchodilatation, and incentive spirometry and early ambulation  Lung nodularity-smoking history.  CT chest is pending.   Follow Up Instructions: Follow-up in 2 to 3 months with Dr. Delton Coombes   I discussed the assessment and treatment plan with the patient. The patient was provided an opportunity to ask questions and all were answered. The patient agreed with the plan and demonstrated an understanding of the instructions.   The patient was advised to call back or seek an in-person evaluation if the symptoms worsen or if the condition fails to improve as anticipated.  I provided 30  minutes of non-face-to-face time during this encounter.   Rubye Oaks, NP

## 2023-06-09 NOTE — Patient Instructions (Signed)
Stop Pulmicort nebs.  Continue on Stiolto 2 puffs daily, rinse after use Albuterol inhaler or neb As needed   Liquid Mucinex DM Twice daily  As needed  cough/congestion  Activity as tolerated.  Continue on Oxygen 3l/m .  CT chest without contrast as planned  Follow-up in the office in  2-3 months and As needed  -Please call to book this appointment .

## 2023-06-10 ENCOUNTER — Encounter (HOSPITAL_COMMUNITY): Payer: Self-pay

## 2023-06-10 ENCOUNTER — Ambulatory Visit (HOSPITAL_COMMUNITY): Admission: RE | Admit: 2023-06-10 | Payer: 59 | Source: Ambulatory Visit

## 2023-06-13 ENCOUNTER — Inpatient Hospital Stay: Payer: 59 | Attending: Hematology

## 2023-06-13 DIAGNOSIS — D500 Iron deficiency anemia secondary to blood loss (chronic): Secondary | ICD-10-CM

## 2023-06-13 DIAGNOSIS — D509 Iron deficiency anemia, unspecified: Secondary | ICD-10-CM | POA: Diagnosis not present

## 2023-06-13 LAB — CBC WITH DIFFERENTIAL/PLATELET
Abs Immature Granulocytes: 0.03 10*3/uL (ref 0.00–0.07)
Basophils Absolute: 0.1 10*3/uL (ref 0.0–0.1)
Basophils Relative: 1 %
Eosinophils Absolute: 0.5 10*3/uL (ref 0.0–0.5)
Eosinophils Relative: 7 %
HCT: 42.6 % (ref 36.0–46.0)
Hemoglobin: 13 g/dL (ref 12.0–15.0)
Immature Granulocytes: 0 %
Lymphocytes Relative: 26 %
Lymphs Abs: 1.9 10*3/uL (ref 0.7–4.0)
MCH: 28 pg (ref 26.0–34.0)
MCHC: 30.5 g/dL (ref 30.0–36.0)
MCV: 91.8 fL (ref 80.0–100.0)
Monocytes Absolute: 0.5 10*3/uL (ref 0.1–1.0)
Monocytes Relative: 7 %
Neutro Abs: 4.2 10*3/uL (ref 1.7–7.7)
Neutrophils Relative %: 59 %
Platelets: 355 10*3/uL (ref 150–400)
RBC: 4.64 MIL/uL (ref 3.87–5.11)
RDW: 15.4 % (ref 11.5–15.5)
WBC: 7.2 10*3/uL (ref 4.0–10.5)
nRBC: 0 % (ref 0.0–0.2)

## 2023-06-13 LAB — IRON AND TIBC
Iron: 68 ug/dL (ref 28–170)
Saturation Ratios: 16 % (ref 10.4–31.8)
TIBC: 424 ug/dL (ref 250–450)
UIBC: 356 ug/dL

## 2023-06-13 LAB — FERRITIN: Ferritin: 66 ng/mL (ref 11–307)

## 2023-06-16 ENCOUNTER — Ambulatory Visit: Payer: Self-pay | Admitting: Internal Medicine

## 2023-06-16 NOTE — Telephone Encounter (Signed)
  Chief Complaint: low FSBS  Symptoms: weak, fatigue Frequency: at 4:45 am FSBS 51, 47 at present time FSBS 51 when called and nurse asked for new reading: FSBS now 66 Pertinent Negatives: Patient denies confusion, last meal last night & only peanut butter today Disposition: [] ED /[] Urgent Care (no appt availability in office) / [] Appointment(In office/virtual)/ []  [facility]/ [x] Home Care/ [] Refused Recommended Disposition /[] [facility]/ []  Follow-up with PCP Additional Notes: patient called r/t low FSBS starting around 4:45 am this morning in 40's: at time patient called FSBS 51 and now 74.  Patient stated she used her emergency pen for low FSBS at 10:02 am. Pt last meal last night and only a spoon of peanut butter today.  Nurse gave care advice to include eating and recheck FSBS.  Reason for Disposition  [1] Blood glucose 70 mg/dl (3.9 mmol/l) or below, OR symptomatic AND [2] cause known  Answer Assessment - Initial Assessment Questions 1. SYMPTOMS: "What symptoms are you concerned about?"     Weak, sleepy 2. ONSET:  "When did the symptoms start?"     Since 4:45 am this morning 3. BLOOD GLUCOSE: "What is your blood glucose level?"      61 4. USUAL RANGE: "What is your blood glucose level usually?" (e.g., usual fasting morning value, usual evening value)     Range around 124 5. TYPE 1 or 2:  "Do you know what type of diabetes you have?"  (e.g., Type 1, Type 2, Gestational; doesn't know)      Type 2 6. INSULIN: "Do you take insulin?" "What type of insulin(s) do you use? What is the mode of delivery? (syringe, pen; injection or pump) "When did you last give yourself an insulin dose?" (i.e., time or hours/minutes ago) "How much did you give?" (i.e., how many units)     Last dose 10:02 with pen for low FSBS 7. DIABETES PILLS: "Do you take any pills for your diabetes?" If Yes, ask: "What is the name of the medicine(s) that you take for high blood sugar?"     unknown 8.  OTHER SYMPTOMS: "Do you have any symptoms?" (e.g., fever, frequent urination, difficulty breathing, vomiting)     Low urine flow 9. LOW BLOOD GLUCOSE TREATMENT: "What have you done so far to treat the low blood glucose level?"     Peanut butter and pen 10. FOOD: "When did you last eat or drink?"       Last night 11. ALONE: "Are you alone right now or is someone with you?"        alone 12. PREGNANCY: "Is there any chance you are pregnant?" "When was your last menstrual period?"       no  Protocols used: Diabetes - Low Blood Sugar-A-AH

## 2023-06-16 NOTE — Telephone Encounter (Signed)
Patient advised.

## 2023-06-19 NOTE — Progress Notes (Unsigned)
VIRTUAL VISIT via TELEPHONE NOTE [facility]   I connected with Kayla Ryan  on 06/20/23 at  8:26 AM by telephone and verified that I am speaking with the correct person using two identifiers.  Location: Patient: Home Provider: [facility]   I discussed the limitations, risks, security and privacy concerns of performing an evaluation and management service by telephone and the availability of in person appointments. I also discussed with the patient that there may be a patient responsible charge related to this service. The patient expressed understanding and agreed to proceed.  REASON FOR VISIT:  Follow-up for iron deficiency anemia   CURRENT THERAPY: IV iron infusions  INTERVAL HISTORY:  Ms. Kayla Ryan is contacted today for follow-up of iron deficiency anemia,.  She was last seen by Rojelio Brenner PA-C on 03/20/2023.  Previously, her treatment course was complicated by anxiety (history of previous trauma that has left her afraid to leave her house) leading to poor compliance, but since last visit she has been careful to attend her appointments and receive iron infusions as prescribed.    She received Feraheme x 2 on 03/24/2023 and 04/03/2023.  She called nurse line on 04/05/2023 to report that she had had multitude of symptoms following IV iron, including severe nausea, diarrhea, abdominal distention, rash across her abdomen, severe leg cramps extending into her thighs, and difficulty swallowing.  She was instructed to present to ED, but declined to do so.  She reports that symptoms resolved within 2-3 days, which she self-treated with Benadryl and Zofran.  Otherwise, felt better with improved energy.  She continues to have issues with chronic diarrhea.  She previously had issues with frequent melanotic ("coffee-ground diarrhea") stool, but reports that this is improved and she is now noticing it only once or twice a month.  She denies any rectal bleeding.   Her ice pica resolved.  Her headaches and lightheadedness improved.  Other symptoms as reported in ROS below.   She has 50% energy and 70% appetite. She endorses that she is maintaining a stable weight.  REVIEW OF SYSTEMS:   Review of Systems  Constitutional:  Negative for chills, diaphoresis, fever, malaise/fatigue and weight loss.  HENT:         Difficulty chewing and swallowing due to dentures  Respiratory:  Positive for cough and shortness of breath.   Cardiovascular:  Positive for palpitations. Negative for chest pain.  Gastrointestinal:  Positive for diarrhea and nausea. Negative for abdominal pain, blood in stool, melena and vomiting.  Genitourinary:  Positive for frequency ("dribbling").  Musculoskeletal:  Positive for joint pain and myalgias.  Neurological:  Positive for dizziness and tingling. Negative for headaches.  Psychiatric/Behavioral:  Positive for depression.      PHYSICAL EXAM: (per limitations of virtual telephone visit)  The patient is alert and oriented x 3, exhibiting adequate mentation, good mood, and ability to speak in full sentences and execute sound judgement.  ASSESSMENT & PLAN:  1.  Normocytic anemia: - Etiology chronic GI blood loss, CKD stage IIIb, and iron deficiency.  - Hospitalized in November 2022 with Hgb 5.8, s/p 4 units PRBC - Most recent blood transfusion was in January 2023. - Colonoscopy and EGD (04/30/2021) showed no obvious source of blood loss. - Capsule study (09/22/2021): Few gastric erosions, nonbleeding.  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 - She cannot take oral iron due to allergies (tongue swells, rash, loin pain).  High intolerance of IV iron  with significant side effects (see below). - Most recent IV iron with Feraheme x 2 in August 2024.   - Apart from the side effects of the iron itself, she reports that she felt improved energy after her IV iron. - She has black tarry stools on occasion - Most recent labs  (06/13/2023): Hgb 13.0/MCV 91.8, ferritin 66, iron saturation 16% with TIBC 424.  Previously noted thrombocytosis (reactive secondary to iron deficiency) has resolved. - PLAN: Discussed with patient that due to her severe side effects from IV iron, it is reasonable to hold off on IV iron and see how her ferritin trends at her next visit.  I did also offer therapeutic iron due to her ongoing fatigue in the setting of ferritin <100.  She opts for additional IV iron infusions.  We will schedule her for Ferrlecit 125 mg x 4 doses. - Labs and RTC in 3 months - Patient advised to continue close follow-up with GI   2.  Intolerance of IV iron infusions  - Reports previous reactions to iron infusions with Venofer (severe nausea, rash, itching, myalgias). She has received IV Venofer 100 mg every 5 days in [facility] under the direction of Dr. Ottis Stain.  (Per notes by Dr. Ottis Stain from 2020, patient was receiving monthly Venofer as maintenance.)  She is also unable to tolerate steroid premedications due to agitation and mood changes. - Patient experienced severe low back pain, nausea, vomiting, and diarrhea after IV Venofer received in July/August 2023 - Trial of Feraheme in November/December 2023, due to more favorable adverse event profile Low-dose Feraheme (90 mg) on 06/15/2022, and reported diarrhea and nausea for the next several days.   Full dose Feraheme (510 mg) on 08/04/2022, and called the next day to report severe diarrhea and rash. Feraheme (510 mg) x 2  on 03/24/2023 and 04/03/2023.  Called on 04/05/2023 to report severe nausea, diarrhea, abdominal distention, rash across her abdomen, severe leg cramps extending into her thighs, and difficulty swallowing.  Advised to go to ED, but declined to do so.   - Premedication including IV Pepcid, IV cetirizine, and Tylenol. - Also requires antiemetic prior to infusions. Zofran and dicyclomine improved her nausea and diarrhea after iron infusions. Has at times required prn  anxiolytic. - Patient requests to avoid IV Solu-Medrol as premedication due to intolerance of steroids with side effects of mood swings and aggression - Note that Solu-Medrol could still be used as rescue medication - PLAN: Trial of Ferrlecit, as above; along with premedication with IV Pepcid, IV cetirizine, IV Zofran, and p.o. Tylenol.   3.  Noncompliance  - Patient has had frequent cancellations of her IV iron - Patient cites reason for cancellations as being severe chronic diarrhea related to her IBS, somewhat relieved with Bentyl and Imodium, following with GI.  Diarrhea tends to be better in the afternoons, therefore she prefers afternoon appointments for IV iron. 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 - After appointment and discussion of the above in August 2024, patient has exhibited significant effort at compliance and has done much better attending her appointments and treatments - PLAN: Discussed at length with patient regarding strategies we can employ to improve her ability to follow through on prescribed treatment regimen.  She understands that continued cancellations could result in dismissal from clinic.  She is very apologetic and "would like to do better." - Patient given information on local therapists who may be able to assist with her  anxiety and processing her previous trauma - If ongoing issues, we will get social worker involved for further assessment of barriers to care   4. Other history - PMH: CKD stage IIIb.  COPD with chronic hypoxic respiratory failure (on 3 L supplemental oxygen continuously), dermatomyositis/muscular dystrophy, GERD, history of Crohn's disease but with recent EGD/colonoscopy negative for any signs of Crohn's. - She is widowed.  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  Quit smoking 20+ years ago. - Mother had lung cancer and  father had lung cancer.  Niece had melanoma.   PLAN SUMMARY:   >> Weekly Ferrlecit 125 mg x 4 >> Labs (CBC/D, ferritin, iron/TIBC) in 3 months >> PHONE visit in 3 months (after labs)   ** Last office visit 03/20/23       I discussed the assessment and treatment plan with the patient. The patient was provided an opportunity to ask questions and all were answered. The patient agreed with the plan and demonstrated an understanding of the instructions.   The patient was advised to call back or seek an in-person evaluation if the symptoms worsen or if the condition fails to improve as anticipated.  I provided 22 minutes of non-face-to-face time during this encounter.  Carnella Guadalajara, PA-C 06/20/2023 8:48 AM

## 2023-06-20 ENCOUNTER — Inpatient Hospital Stay: Payer: 59 | Admitting: Physician Assistant

## 2023-06-20 DIAGNOSIS — D500 Iron deficiency anemia secondary to blood loss (chronic): Secondary | ICD-10-CM | POA: Diagnosis not present

## 2023-06-20 DIAGNOSIS — K922 Gastrointestinal hemorrhage, unspecified: Secondary | ICD-10-CM | POA: Diagnosis not present

## 2023-06-20 DIAGNOSIS — Z79891 Long term (current) use of opiate analgesic: Secondary | ICD-10-CM | POA: Diagnosis not present

## 2023-06-20 DIAGNOSIS — M47816 Spondylosis without myelopathy or radiculopathy, lumbar region: Secondary | ICD-10-CM | POA: Diagnosis not present

## 2023-06-20 DIAGNOSIS — M797 Fibromyalgia: Secondary | ICD-10-CM | POA: Diagnosis not present

## 2023-06-20 DIAGNOSIS — G894 Chronic pain syndrome: Secondary | ICD-10-CM | POA: Diagnosis not present

## 2023-06-21 ENCOUNTER — Telehealth: Payer: Self-pay

## 2023-06-21 ENCOUNTER — Encounter (HOSPITAL_COMMUNITY): Payer: Self-pay

## 2023-06-21 ENCOUNTER — Ambulatory Visit (HOSPITAL_COMMUNITY): Admission: RE | Admit: 2023-06-21 | Payer: 59 | Source: Ambulatory Visit

## 2023-06-21 NOTE — Telephone Encounter (Signed)
Copied from CRM [id number]. Topic: General - Other >> Jun 21, 2023 10:33 AM Prudencio Pair wrote: Reason for CRM: Patient calling to let nurse know that she is now ok. Patient stated that she had a problem with her blood glucose being so low and dropping below 50 last week. She stated when she calibrated where she prick her finger, she didn't realize that she was using the old machine. Patient states that now she's using her new kit and the Dexcom was faulty. She discarded the old kit and everything is perfect now. Patient also stated that at the time, Dr. Allena Katz told her to stop taking the jardiance but she stated she continued taking it since she realized she was using the old kit. Patient just wanted to call and update nurse.

## 2023-06-22 ENCOUNTER — Telehealth: Payer: Self-pay | Admitting: Internal Medicine

## 2023-06-22 NOTE — Telephone Encounter (Signed)
Copied from CRM [id number]. Topic: Clinical - Medication Refill >> Jun 22, 2023  8:13 AM Herbert Seta B wrote: Most Recent Primary Care Visit:  Provider: Telford Nab  Department: [facility]  Visit Type: MEDICARE AWV, SEQUENTIAL  Date: 04/17/2023  Medication:  tirzepatide (MOUNJARO) 5 MG/0.5ML Pen  PATIENT CALLING STATES THAT PCP AGREED TO SEND MOUNJARO 7.5mg  RX AFTER PATIENT FINISHED 5mg  PEN?   Has the patient contacted their pharmacy? Yes-need new RX (Agent: If no, request that the patient contact the pharmacy for the refill. If patient does not wish to contact the pharmacy document the reason why and proceed with request.) (Agent: If yes, when and what did the pharmacy advise?)  Is this the correct pharmacy for this prescription? Yes If no, delete pharmacy and type the correct one.  This is the patient's preferred pharmacy:  [id number] - [place], [place] - [address] [place] [address] Phone: [phone] Fax: [phone]   Has the prescription been filled recently? No  Is the patient out of the medication? Yes  Has the patient been seen for an appointment in the last year OR does the patient have an upcoming appointment? yes  Can we respond through MyChart? yes  Agent: Please be advised that Rx refills may take up to 3 business days. We ask that you follow-up with your pharmacy.

## 2023-06-22 NOTE — Telephone Encounter (Signed)
Sent mychart message sent to pt with drs recommendations

## 2023-06-23 DIAGNOSIS — J9611 Chronic respiratory failure with hypoxia: Secondary | ICD-10-CM | POA: Diagnosis not present

## 2023-06-23 DIAGNOSIS — J449 Chronic obstructive pulmonary disease, unspecified: Secondary | ICD-10-CM | POA: Diagnosis not present

## 2023-06-26 NOTE — Telephone Encounter (Signed)
Patient called requesting notes be sent to Dr ,Benson Norway for risk assessment on dental procedure. Faxed notes from 06/09/2023

## 2023-06-27 ENCOUNTER — Telehealth: Payer: Self-pay

## 2023-06-27 NOTE — Telephone Encounter (Signed)
Copied from CRM [id number]. Topic: General - Other >> Jun 27, 2023  4:29 PM [name] wrote: Reason for CRM: Pt stated [name] was supposed to call her yesterday at 11:30 and she never received a call. Would like a call back from her

## 2023-06-27 NOTE — Telephone Encounter (Signed)
We do not have a kimberly in this office

## 2023-06-29 ENCOUNTER — Inpatient Hospital Stay: Payer: 59

## 2023-06-29 ENCOUNTER — Other Ambulatory Visit: Payer: Self-pay | Admitting: *Deleted

## 2023-06-29 DIAGNOSIS — Z91199 Patient's noncompliance with other medical treatment and regimen due to unspecified reason: Secondary | ICD-10-CM

## 2023-07-04 ENCOUNTER — Encounter: Payer: Self-pay | Admitting: Internal Medicine

## 2023-07-04 ENCOUNTER — Telehealth: Payer: 59 | Admitting: Internal Medicine

## 2023-07-04 ENCOUNTER — Other Ambulatory Visit: Payer: Self-pay | Admitting: Internal Medicine

## 2023-07-04 DIAGNOSIS — J0110 Acute frontal sinusitis, unspecified: Secondary | ICD-10-CM

## 2023-07-04 DIAGNOSIS — E1149 Type 2 diabetes mellitus with other diabetic neurological complication: Secondary | ICD-10-CM

## 2023-07-04 MED ORDER — TIRZEPATIDE 5 MG/0.5ML ~~LOC~~ SOAJ: 5.0000 mg | SUBCUTANEOUS | 1 refills | Status: DC

## 2023-07-04 MED ORDER — AZITHROMYCIN 250 MG PO TABS: ORAL_TABLET | ORAL | 0 refills | Status: AC

## 2023-07-04 NOTE — Assessment & Plan Note (Addendum)
Started empiric azithromycin as she has persistent symptoms despite symptomatic treatment, considering her COPD and oxygen dependence - started antibiotic early Nasal saline spray as needed for nasal congestion Robitussin as needed for cough Tylenol as needed for fever or myalgias

## 2023-07-04 NOTE — Progress Notes (Signed)
Virtual Visit via Video Note   Because of Kayla Ryan's co-morbid illnesses, she is at least at moderate risk for complications without adequate follow up.  This format is felt to be most appropriate for this patient at this time.  All issues noted in this document were discussed and addressed.  A limited physical exam was performed with this format.      Evaluation Performed:  Follow-up visit  Date:  07/04/2023   ID:  ECE HARDWICKE, DOB 1956-06-24, MRN [id number]  Patient Location: Home Provider Location: Office/Clinic  Participants: Patient Location of Patient: Home Location of Provider: Telehealth Consent was obtain for visit to be over via telehealth. I verified that I am speaking with the correct person using two identifiers.  PCP:  Anabel Halon, MD   Chief Complaint: Cough, nasal congestion and fever  History of Present Illness:    Kayla Ryan is a 67 y.o. female who has a video visit for complaint of cough, nasal congestion, fever and myalgias for the last 4 days.  Her fever and myalgias have improved now.  She has tried taking Zicam for symptomatic relief.  She has history of COPD and is oxygen dependent.  Denies any recent worsening of dyspnea or wheezing currently.  Her home COVID test was negative.  The patient does have symptoms concerning for COVID-19 infection (fever, chills, cough, or new shortness of breath).   Past Medical, Surgical, Social History, Allergies, and Medications have been Reviewed.  Past Medical History:  Diagnosis Date   Allergy Unknown   Anemia    Anxiety 1883   Arthritis    Asthma    CHF (congestive heart failure) (HCC) 2021   Chronic bronchitis    Crohn disease (HCC)    Emphysema    Emphysema of lung (HCC) 2003   Fibromyalgia    GERD (gastroesophageal reflux disease)    GI bleed    Herpes    History of blood transfusion    Hyperlipidemia    Hypertension 2015   IBS (irritable bowel syndrome)    Kidney failure     Migraines    Muscular dystrophy (HCC)    Neck pain    Oxygen deficiency 2021   PAF (paroxysmal atrial fibrillation) (HCC)    Not anticoagulated with history of severe GI bleeding   Plantar fasciitis    Polymyositis (HCC)    PONV (postoperative nausea and vomiting)    Right thyroid nodule 04/07/2022   Scoliosis    Type 2 diabetes mellitus (HCC)    Past Surgical History:  Procedure Laterality Date   ABDOMINAL HYSTERECTOMY     AGILE CAPSULE N/A 06/24/2021   Procedure: AGILE CAPSULE;  Surgeon: Lanelle Bal, DO;  Location: [facility];  Service: Endoscopy;  Laterality: N/A;   AGILE CAPSULE N/A 07/22/2021   Procedure: AGILE CAPSULE;  Surgeon: Corbin Ade, MD;  Location: [facility];  Service: Endoscopy;  Laterality: N/A;  7:30am   bone spur     BREAST SURGERY  1995   CHOLECYSTECTOMY     COLONOSCOPY WITH PROPOFOL N/A 04/30/2021   Procedure: COLONOSCOPY WITH PROPOFOL;  Surgeon: Corbin Ade, MD;  Location: [facility];  Service: Endoscopy;  Laterality: N/A;   ESOPHAGEAL DILATION N/A 04/30/2021   Procedure: ESOPHAGEAL DILATION;  Surgeon: Corbin Ade, MD;  Location: [facility];  Service: Endoscopy;  Laterality: N/A;   ESOPHAGOGASTRODUODENOSCOPY (EGD) WITH PROPOFOL N/A 04/30/2021   Procedure: ESOPHAGOGASTRODUODENOSCOPY (EGD) WITH PROPOFOL;  Surgeon: Corbin Ade, MD;  Location: [facility];  Service: Endoscopy;  Laterality: N/A;   GIVENS CAPSULE STUDY N/A 09/22/2021   Procedure: GIVENS CAPSULE STUDY;  Surgeon: Corbin Ade, MD;  Location: [facility];  Service: Endoscopy;  Laterality: N/A;  7:30am   HERNIA REPAIR     LEFT HEART CATHETERIZATION WITH CORONARY ANGIOGRAM N/A 11/07/2013   Procedure: LEFT HEART CATHETERIZATION WITH CORONARY ANGIOGRAM;  Surgeon: Runell Gess, MD;  Location: [facility] C[facility];  Service: Cardiovascular;  Laterality: N/A;   MASTECTOMY PARTIAL / LUMPECTOMY     OOPHORECTOMY     ROTATOR CUFF REPAIR     TOOTH EXTRACTION N/A 04/15/2022    Procedure: DENTAL RESTORATION/EXTRACTIONS;  Surgeon: Ocie Doyne, DMD;  Location: [facility] OR;  Service: Oral Surgery;  Laterality: N/A;     Current Meds  Medication Sig   azithromycin (ZITHROMAX) 250 MG tablet Take 2 tablets on day 1, then 1 tablet daily on days 2 through 5   Current Facility-Administered Medications for the 07/04/23 encounter (Video Visit) with Anabel Halon, MD  Medication   ciprofloxacin (CIPRO) tablet 500 mg     Allergies:   Sulfa antibiotics, Clindamycin/lincomycin, Dilaudid [hydromorphone hcl], Iron, Metronidazole, Prednisone, Statins, Cephalexin, Methotrexate derivatives, and Morphine and codeine   ROS:   Please see the history of present illness. All other systems reviewed and are negative.   Labs/Other Tests and Data Reviewed:    Recent Labs: 09/01/2022: TSH 3.800 12/08/2022: ALT 31; BUN 39; Creatinine, Ser 1.00; Potassium 4.7; Sodium 140 06/13/2023: Hemoglobin 13.0; Platelets 355   Recent Lipid Panel Lab Results  Component Value Date/Time   CHOL 200 (H) 12/08/2022 03:47 PM   TRIG 216 (H) 12/08/2022 03:47 PM   HDL 46 12/08/2022 03:47 PM   CHOLHDL 4.3 12/08/2022 03:47 PM   LDLCALC 116 (H) 12/08/2022 03:47 PM    Wt Readings from Last 3 Encounters:  05/30/23 169 lb 12.8 oz (77 kg)  05/16/23 172 lb (78 kg)  04/11/23 165 lb (74.8 kg)     Objective:    Vital Signs:  There were no vitals taken for this visit.   VITAL SIGNS:  reviewed GEN:  no acute distress EYES:  sclerae anicteric, EOMI - Extraocular Movements Intact RESPIRATORY:  Able to speak in full sentences.  Symmetric expansion.  On home O2 - 3 lpm. NEURO:  alert and oriented x 3, no obvious focal deficit PSYCH:  normal affect  ASSESSMENT & PLAN:    Acute sinusitis Started empiric azithromycin as she has persistent symptoms despite symptomatic treatment, considering her COPD and oxygen dependence - started antibiotic early Nasal saline spray as needed for nasal congestion Robitussin as  needed for cough Tylenol as needed for fever or myalgias   I discussed the assessment and treatment plan with the patient. The patient was provided an opportunity to ask questions, and all were answered. The patient agreed with the plan and demonstrated an understanding of the instructions.   The patient was advised to call back or seek an in-person evaluation if the symptoms worsen or if the condition fails to improve as anticipated.  The above assessment and management plan was discussed with the patient. The patient verbalized understanding of and has agreed to the management plan.   Medication Adjustments/Labs and Tests Ordered: Current medicines are reviewed at length with the patient today.  Concerns regarding medicines are outlined above.   Tests Ordered: No orders of the defined types were placed in this encounter.  Medication Changes: Meds ordered this encounter  Medications   azithromycin (ZITHROMAX) 250 MG tablet    Sig: Take 2 tablets on day 1, then 1 tablet daily on days 2 through 5    Dispense:  6 tablet    Refill:  0     Note: This dictation was prepared with Dragon dictation along with smaller phrase technology. Similar sounding words can be transcribed inadequately or may not be corrected upon review. Any transcriptional errors that result from this process are unintentional.      Disposition:  Follow up  Signed, Anabel Halon, MD  07/04/2023 2:55 PM     [facility]

## 2023-07-07 ENCOUNTER — Ambulatory Visit (HOSPITAL_COMMUNITY): Admission: RE | Admit: 2023-07-07 | Payer: 59 | Source: Ambulatory Visit

## 2023-07-07 ENCOUNTER — Encounter (HOSPITAL_COMMUNITY): Payer: Self-pay

## 2023-07-08 ENCOUNTER — Other Ambulatory Visit: Payer: Self-pay | Admitting: Internal Medicine

## 2023-07-08 DIAGNOSIS — R42 Dizziness and giddiness: Secondary | ICD-10-CM

## 2023-07-08 DIAGNOSIS — J439 Emphysema, unspecified: Secondary | ICD-10-CM | POA: Diagnosis not present

## 2023-07-08 DIAGNOSIS — J449 Chronic obstructive pulmonary disease, unspecified: Secondary | ICD-10-CM | POA: Diagnosis not present

## 2023-07-08 DIAGNOSIS — M5030 Other cervical disc degeneration, unspecified cervical region: Secondary | ICD-10-CM

## 2023-07-10 ENCOUNTER — Ambulatory Visit (HOSPITAL_COMMUNITY): Payer: 59

## 2023-07-10 ENCOUNTER — Ambulatory Visit: Payer: Self-pay | Admitting: *Deleted

## 2023-07-10 NOTE — Patient Outreach (Signed)
  Care Coordination   Follow Up Visit Note   07/10/2023 Name: Kayla Ryan MRN: [id number] DOB: 1955-10-08  Kayla Ryan is a 67 y.o. year old female who sees Anabel Halon, MD for primary care. I spoke with  Kayla Ryan by phone today.  What matters to the patients health and wellness today?  respiratory worsening symptoms, Missed Ct visit 07/07/23, MRI visit today, BRAT diet for loose stools, Chronic pain    Goals Addressed             This Visit's Progress    Patient will be able to have home management of her chronic pain, IBS/Crohn's, COPD,diabetes, finances- RN CM services   Not on track    Patient will be able to attend all her medical appointments- Missed 07/07/23 [facility] visit "forgot' Patient will have decreased pulmonary worsening symptoms - 07/10/23 confirmed her antibiotics has cause improvements as evidence by her secretions no longer being green but clear on 07/10/23 Patient will continue to follow the treatment plan of her pain management providers to maintain her pain at pain level of 5 or below as evidence by patient voiced pain levels   Interventions Today    Flowsheet Row Most Recent Value  Chronic Disease   Chronic disease during today's visit Chronic Obstructive Pulmonary Disease (COPD), Other  [follow up respiratory worsening symptoms, Missed Ct visit 07/07/23, MRI visit today, BRAT diet for loose stools, Chronic pain]  General Interventions   General Interventions Discussed/Reviewed General Interventions Reviewed, Health Screening, Sick Day Rules, Communication with, Doctor Visits  Doctor Visits Discussed/Reviewed Doctor Visits Reviewed, PCP, Specialist  Health Screening --  [facility] 07/07/23 CT Pending MRI]  PCP/Specialist Visits Compliance with follow-up visit  [facility] 07/07/23 CT "forgot", Confirmed she is followed by pain managment MD]  Communication with PCP/Specialists  [attempts to reach [facility] unsuccessful]  Education Interventions    Education Provided Provided Education  Mental Health Interventions   Mental Health Discussed/Reviewed Mental Health Reviewed, Coping Strategies  [support offered, Encouragement]  Nutrition Interventions   Nutrition Discussed/Reviewed Nutrition Reviewed, Carbohydrate meal planning  [Discussed the BRAT diet for loose stool managment except Bananas as her potassium is reported to be elevated by pcp]  Pharmacy Interventions   Pharmacy Dicussed/Reviewed Pharmacy Topics Reviewed, Medications and their functions, Medication Adherence  [inquired about her taking her pain medicines]  Safety Interventions   Safety Discussed/Reviewed Safety Reviewed, Fall Risk, Home Safety  Home Safety Assistive Devices              SDOH assessments and interventions completed:  No     Care Coordination Interventions:  Yes, provided   Follow up plan: Follow up call scheduled for 07/24/23    Encounter Outcome:  Patient Visit Completed   [name], RN, BSN, [facility]  [facility] Care Management Coordinator  [phone]  Fax: [phone]

## 2023-07-10 NOTE — Patient Instructions (Addendum)
Visit Information  Thank you for taking time to visit with me today. Please don't hesitate to contact me if I can be of assistance to you.   Following are the goals we discussed today:   Goals Addressed             This Visit's Progress    Patient will be able to have home management of her chronic pain, IBS/Crohn's, COPD,diabetes, finances- RN CM services   Not on track    Patient will be able to attend all her medical appointments- Missed 07/07/23 [facility] CT visit "forgot' Patient will have decreased pulmonary worsening symptoms - 07/10/23 confirmed her antibiotics has cause improvements as evidence by her secretions no longer being green but clear on 07/10/23 Patient will continue to follow the treatment plan of her pain management providers to maintain her pain at pain level of 5 or below as evidence by patient voiced pain levels   Interventions Today    Flowsheet Row Most Recent Value  Chronic Disease   Chronic disease during today's visit Chronic Obstructive Pulmonary Disease (COPD), Other  [follow up respiratory worsening symptoms, Missed Ct visit 07/07/23, MRI visit today, BRAT diet for loose stools, Chronic pain]  General Interventions   General Interventions Discussed/Reviewed General Interventions Reviewed, Health Screening, Sick Day Rules, Communication with, Doctor Visits  Doctor Visits Discussed/Reviewed Doctor Visits Reviewed, PCP, Specialist  Health Screening --  [facility] 07/07/23 CT Pending MRI]  PCP/Specialist Visits Compliance with follow-up visit  [facility] 07/07/23 CT "forgot", Confirmed she is followed by pain managment MD]  Communication with PCP/Specialists  [attempts to reach [facility] radiology unsuccessful]  Education Interventions   Education Provided Provided Education  Mental Health Interventions   Mental Health Discussed/Reviewed Mental Health Reviewed, Coping Strategies  [support offered, Encouragement]  Nutrition Interventions   Nutrition Discussed/Reviewed Nutrition  Reviewed, Carbohydrate meal planning  [Discussed the BRAT diet for loose stool managment except Bananas as her potassium is reported to be elevated by pcp]  Pharmacy Interventions   Pharmacy Dicussed/Reviewed Pharmacy Topics Reviewed, Medications and their functions, Medication Adherence  [inquired about her taking her pain medicines]  Safety Interventions   Safety Discussed/Reviewed Safety Reviewed, Fall Risk, Home Safety  Home Safety Assistive Devices              Our next appointment is by telephone on 07/24/23 at 1100  Please call the care guide team at [phone] if you need to cancel or reschedule your appointment.   If you are experiencing a Mental Health or Behavioral Health Crisis or need someone to talk to, please call the Suicide and Crisis Lifeline: 988 call the [place] National Suicide Prevention Lifeline: 309 243 7440 or TTY: (717)074-3143 TTY (361)105-4167) to talk to a trained counselor call 1-800-273-TALK (toll free, 24 hour hotline) call the [facility]: [phone] call 911   Patient verbalizes understanding of instructions and care plan provided today and agrees to view in MyChart. Active MyChart status and patient understanding of how to access instructions and care plan via MyChart confirmed with patient.     The patient has been provided with contact information for the care management team and has been advised to call with any health related questions or concerns.   Anaise Sterbenz L. Noelle Penner, RN, BSN, [facility]  VBCI Care Management Coordinator  [phone]  Fax: [phone]

## 2023-07-11 ENCOUNTER — Encounter (HOSPITAL_COMMUNITY): Payer: Self-pay

## 2023-07-11 ENCOUNTER — Other Ambulatory Visit (HOSPITAL_COMMUNITY): Payer: 59

## 2023-07-11 ENCOUNTER — Ambulatory Visit (HOSPITAL_COMMUNITY)
Admission: RE | Admit: 2023-07-11 | Discharge: 2023-07-11 | Disposition: A | Discharge status: Home / Self Care | Payer: 59 | Source: Ambulatory Visit | Attending: Neurology | Admitting: Neurology

## 2023-07-11 ENCOUNTER — Ambulatory Visit (HOSPITAL_COMMUNITY)
Admission: RE | Admit: 2023-07-11 | Discharge: 2023-07-11 | Discharge status: Home / Self Care | Payer: 59 | Source: Ambulatory Visit | Attending: Emergency Medicine | Admitting: Emergency Medicine

## 2023-07-11 DIAGNOSIS — I700 Atherosclerosis of aorta: Secondary | ICD-10-CM | POA: Diagnosis not present

## 2023-07-11 DIAGNOSIS — M4802 Spinal stenosis, cervical region: Secondary | ICD-10-CM | POA: Insufficient documentation

## 2023-07-11 DIAGNOSIS — J449 Chronic obstructive pulmonary disease, unspecified: Secondary | ICD-10-CM

## 2023-07-11 DIAGNOSIS — E1142 Type 2 diabetes mellitus with diabetic polyneuropathy: Secondary | ICD-10-CM

## 2023-07-11 DIAGNOSIS — G500 Trigeminal neuralgia: Secondary | ICD-10-CM

## 2023-07-11 DIAGNOSIS — M47812 Spondylosis without myelopathy or radiculopathy, cervical region: Secondary | ICD-10-CM | POA: Diagnosis not present

## 2023-07-11 DIAGNOSIS — J439 Emphysema, unspecified: Secondary | ICD-10-CM | POA: Diagnosis not present

## 2023-07-11 DIAGNOSIS — R292 Abnormal reflex: Secondary | ICD-10-CM

## 2023-07-11 DIAGNOSIS — M5020 Other cervical disc displacement, unspecified cervical region: Secondary | ICD-10-CM | POA: Diagnosis not present

## 2023-07-11 DIAGNOSIS — R911 Solitary pulmonary nodule: Secondary | ICD-10-CM | POA: Diagnosis not present

## 2023-07-11 DIAGNOSIS — M4803 Spinal stenosis, cervicothoracic region: Secondary | ICD-10-CM | POA: Diagnosis not present

## 2023-07-11 DIAGNOSIS — R519 Headache, unspecified: Secondary | ICD-10-CM | POA: Diagnosis not present

## 2023-07-11 DIAGNOSIS — E041 Nontoxic single thyroid nodule: Secondary | ICD-10-CM | POA: Diagnosis not present

## 2023-07-11 DIAGNOSIS — R918 Other nonspecific abnormal finding of lung field: Secondary | ICD-10-CM | POA: Diagnosis not present

## 2023-07-11 MED ORDER — GADOBUTROL 1 MMOL/ML IV SOLN
7.5000 mL | Freq: Once | INTRAVENOUS | Status: AC | PRN
  Administered 2023-07-11: 7.5 mL via INTRAVENOUS

## 2023-07-13 ENCOUNTER — Inpatient Hospital Stay: Payer: 59 | Attending: Hematology

## 2023-07-13 ENCOUNTER — Inpatient Hospital Stay: Payer: 59 | Admitting: Licensed Clinical Social Worker

## 2023-07-13 VITALS — BP 157/59 | HR 97 | Temp 97.6°F | Resp 19 | Ht 68.0 in | Wt 171.0 lb

## 2023-07-13 DIAGNOSIS — D509 Iron deficiency anemia, unspecified: Secondary | ICD-10-CM | POA: Diagnosis not present

## 2023-07-13 DIAGNOSIS — D500 Iron deficiency anemia secondary to blood loss (chronic): Secondary | ICD-10-CM

## 2023-07-13 MED ORDER — CETIRIZINE HCL 10 MG/ML IV SOLN
10.0000 mg | Freq: Once | INTRAVENOUS | Status: AC
  Administered 2023-07-13: 10 mg via INTRAVENOUS
  Filled 2023-07-13: qty 1

## 2023-07-13 MED ORDER — ACETAMINOPHEN 325 MG PO TABS
650.0000 mg | ORAL_TABLET | Freq: Once | ORAL | Status: AC
  Administered 2023-07-13: 650 mg via ORAL
  Filled 2023-07-13: qty 2

## 2023-07-13 MED ORDER — SODIUM CHLORIDE 0.9 % IV SOLN
125.0000 mg | Freq: Once | INTRAVENOUS | Status: AC
  Administered 2023-07-13: 125 mg via INTRAVENOUS
  Filled 2023-07-13: qty 125

## 2023-07-13 MED ORDER — SODIUM CHLORIDE 0.9 % IV SOLN: INTRAVENOUS | Status: DC

## 2023-07-13 MED ORDER — ONDANSETRON HCL 4 MG/2ML IJ SOLN
8.0000 mg | Freq: Once | INTRAMUSCULAR | Status: AC
  Administered 2023-07-13: 8 mg via INTRAVENOUS
  Filled 2023-07-13: qty 4

## 2023-07-13 MED ORDER — FAMOTIDINE IN NACL 20-0.9 MG/50ML-% IV SOLN
20.0000 mg | Freq: Once | INTRAVENOUS | Status: AC
  Administered 2023-07-13: 20 mg via INTRAVENOUS
  Filled 2023-07-13: qty 50

## 2023-07-13 NOTE — Progress Notes (Signed)
Patient presents today for iron infusion.  Patient is in satisfactory condition with no new complaints voiced.  Vital signs are stable.  We will proceed with infusion per provider orders.  Peripheral IV started with good blood return pre and post infusion.

## 2023-07-13 NOTE — Progress Notes (Signed)
Ferrlecit given today per MD orders. Tolerated infusion without adverse affects. Vital signs stable. No complaints at this time. Discharged from clinic ambulatory in stable condition. Alert and oriented x 3. F/U with [facility] as scheduled.

## 2023-07-13 NOTE — Progress Notes (Signed)
[facility] CSW Progress Note  Visual merchandiser met with patient to discuss inconsistency w/ scheduled appointments.  Pt states she has a number of medical issues and from day to day does not know how she might feel.  Per pt if she is experiencing too much pain or fatigue she does not try to force herself to go to appointments.  Pt recognizes that some things exacerbate her condition and will try not to schedule appointments if she knows she will need a few days to recover.  Pt also states that the treatments she receives in infusion result in her feeling horrible and it is very difficult to motivate herself to come in for four weeks to endure a treatment she knows is going to make her feel bad.  Per pt she had a treatment in either June or July that she tolerated very well and is unsure as to why she can not receive the same treatment she had then.  Pt states it may have been related to insurance.  RN informed of the above and will look into which treatment was administered in June or July and check if this treatment can be resumed.      Rachel Moulds, LCSW Clinical Social Worker [facility]

## 2023-07-14 ENCOUNTER — Encounter: Payer: Self-pay | Admitting: Internal Medicine

## 2023-07-14 NOTE — Telephone Encounter (Signed)
 Care team updated and letter sent for eye exam notes.

## 2023-07-16 ENCOUNTER — Other Ambulatory Visit: Payer: Self-pay | Admitting: Internal Medicine

## 2023-07-16 DIAGNOSIS — I5032 Chronic diastolic (congestive) heart failure: Secondary | ICD-10-CM

## 2023-07-17 LAB — HM DIABETES EYE EXAM

## 2023-07-20 ENCOUNTER — Inpatient Hospital Stay: Payer: 59

## 2023-07-20 VITALS — BP 108/80 | HR 82 | Temp 97.3°F | Resp 19

## 2023-07-20 DIAGNOSIS — D500 Iron deficiency anemia secondary to blood loss (chronic): Secondary | ICD-10-CM

## 2023-07-20 DIAGNOSIS — D509 Iron deficiency anemia, unspecified: Secondary | ICD-10-CM | POA: Diagnosis not present

## 2023-07-20 MED ORDER — SODIUM CHLORIDE 0.9 % IV SOLN
INTRAVENOUS | Status: DC
Start: 2023-07-20 — End: 2023-07-20

## 2023-07-20 MED ORDER — FAMOTIDINE IN NACL 20-0.9 MG/50ML-% IV SOLN
20.0000 mg | Freq: Once | INTRAVENOUS | Status: AC
  Administered 2023-07-20: 20 mg via INTRAVENOUS
  Filled 2023-07-20: qty 50

## 2023-07-20 MED ORDER — ONDANSETRON HCL 4 MG/2ML IJ SOLN
8.0000 mg | Freq: Once | INTRAMUSCULAR | Status: AC
  Administered 2023-07-20: 8 mg via INTRAVENOUS
  Filled 2023-07-20: qty 4

## 2023-07-20 MED ORDER — ACETAMINOPHEN 325 MG PO TABS
650.0000 mg | ORAL_TABLET | Freq: Once | ORAL | Status: AC
  Administered 2023-07-20: 650 mg via ORAL
  Filled 2023-07-20: qty 2

## 2023-07-20 MED ORDER — CETIRIZINE HCL 10 MG/ML IV SOLN
10.0000 mg | Freq: Once | INTRAVENOUS | Status: AC
  Administered 2023-07-20: 10 mg via INTRAVENOUS
  Filled 2023-07-20: qty 1

## 2023-07-20 MED ORDER — SODIUM CHLORIDE 0.9 % IV SOLN
125.0000 mg | Freq: Once | INTRAVENOUS | Status: AC
  Administered 2023-07-20: 125 mg via INTRAVENOUS
  Filled 2023-07-20: qty 10

## 2023-07-20 MED ORDER — METHYLPREDNISOLONE SODIUM SUCC 125 MG IJ SOLR
125.0000 mg | Freq: Once | INTRAMUSCULAR | Status: AC | PRN
  Administered 2023-07-20: 125 mg via INTRAVENOUS

## 2023-07-20 NOTE — Progress Notes (Signed)
Patient presents today for iron infusion.  Patient is in satisfactory condition with no new complaints voiced.  Vital signs are stable.  IV placed in R arm.  IV flushed well with good blood return noted.  We will proceed with infusion per provider orders.    1606: Patient c/o itching all over and throat tightness.  1608:  Dr. Ellin Saba at bedside.  Verbal order for Solumedrol 125 mg IV x one dose.    1610:  Vitals WNL  1611:  Solu-medrol administered.  Restart iron if itching resolves per Dr. Ellin Saba.  1620:  Itching has improved, but not resolved.  1630:  Patient states that itching has almost completely resolved.  Iron restarted per orders by Dr. Ellin Saba.   1652:  Iron completed.  Patient denied any further symptoms.  Patient refused to wait the recommended 30 minute post iron wait time.  Patient advised to report to ED with any recurrent or worsening symptoms.  Vital signs remained stable.  Patient left ambulatory in stable condition.

## 2023-07-20 NOTE — Patient Instructions (Signed)
[facility] [place] - A DEPT OF [facility]  Discharge Instructions: Thank you for choosing [facility] to provide your oncology and hematology care.  If you have a lab appointment with the Cancer Center - please note that after April 8th, 2024, all labs will be drawn in the cancer center.  You do not have to check in or register with the main entrance as you have in the past but will complete your check-in in the cancer center.  Wear comfortable clothing and clothing appropriate for easy access to any Portacath or PICC line.   We strive to give you quality time with your provider. You may need to reschedule your appointment if you arrive late (15 or more minutes).  Arriving late affects you and other patients whose appointments are after yours.  Also, if you miss three or more appointments without notifying the office, you may be dismissed from the clinic at the provider's discretion.      For prescription refill requests, have your pharmacy contact our office and allow 72 hours for refills to be completed.    Today you received the following:  Ferrlecit.  Sodium Ferric Gluconate Complex Injection What is this medication? SODIUM FERRIC GLUCONATE COMPLEX (SOE dee um FER ik GLOO koe nate KOM pleks) treats low levels of iron (iron deficiency anemia) in people with kidney disease. Iron is a mineral that plays an important role in making red blood cells, which carry oxygen from your lungs to the rest of your body. This medicine may be used for other purposes; ask your health care provider or pharmacist if you have questions. COMMON BRAND NAME(S): Ferrlecit, Nulecit What should I tell my care team before I take this medication? They need to know if you have any of the following conditions: Anemia that is not from iron deficiency High levels of iron in the blood An unusual or allergic reaction to iron, other medications, foods, dyes, or preservatives Pregnant or  are trying to become pregnant Breast-feeding How should I use this medication? This medication is injected into a vein. It is given by your care team in a hospital or clinic setting. Talk to your care team about the use of this medication in children. While it may be prescribed for children as young as 6 years for selected conditions, precautions do apply. Overdosage: If you think you have taken too much of this medicine contact a poison control center or emergency room at once. NOTE: This medicine is only for you. Do not share this medicine with others. What if I miss a dose? It is important not to miss your dose. Call your care team if you are unable to keep an appointment. What may interact with this medication? Do not take this medication with any of the following: Deferasirox Deferoxamine Dimercaprol This medication may also interact with the following: Other iron products This list may not describe all possible interactions. Give your health care provider a list of all the medicines, herbs, non-prescription drugs, or dietary supplements you use. Also tell them if you smoke, drink alcohol, or use illegal drugs. Some items may interact with your medicine. What should I watch for while using this medication? Your condition will be monitored carefully while you are receiving this medication. Visit your care team for regular checks on your progress. You may need blood work while you are taking this medication. What side effects may I notice from receiving this medication? Side effects that  you should report to your care team as soon as possible: Allergic reactions--skin rash, itching, hives, swelling of the face, lips, tongue, or throat Low blood pressure--dizziness, feeling faint or lightheaded, blurry vision Shortness of breath Side effects that usually do not require medical attention (report to your care team if they continue or are bothersome): Flushing Headache Joint pain Muscle  pain Nausea Pain, redness, or irritation at injection site This list may not describe all possible side effects. Call your doctor for medical advice about side effects. You may report side effects to FDA at 1-800-FDA-1088. Where should I keep my medication? This medication is given in a hospital or clinic and will not be stored at home. NOTE: This sheet is a summary. It may not cover all possible information. If you have questions about this medicine, talk to your doctor, pharmacist, or health care provider.  2024 Elsevier/Gold Standard (2020-12-18 00:00:00)     To help prevent nausea and vomiting after your treatment, we encourage you to take your nausea medication as directed.  BELOW ARE SYMPTOMS THAT SHOULD BE REPORTED IMMEDIATELY: *FEVER GREATER THAN 100.4 F (38 C) OR HIGHER *CHILLS OR SWEATING *NAUSEA AND VOMITING THAT IS NOT CONTROLLED WITH YOUR NAUSEA MEDICATION *UNUSUAL SHORTNESS OF BREATH *UNUSUAL BRUISING OR BLEEDING *URINARY PROBLEMS (pain or burning when urinating, or frequent urination) *BOWEL PROBLEMS (unusual diarrhea, constipation, pain near the anus) TENDERNESS IN MOUTH AND THROAT WITH OR WITHOUT PRESENCE OF ULCERS (sore throat, sores in mouth, or a toothache) UNUSUAL RASH, SWELLING OR PAIN  UNUSUAL VAGINAL DISCHARGE OR ITCHING   Items with * indicate a potential emergency and should be followed up as soon as possible or go to the Emergency Department if any problems should occur.  Please show the CHEMOTHERAPY ALERT CARD or IMMUNOTHERAPY ALERT CARD at check-in to the Emergency Department and triage nurse.  Should you have questions after your visit or need to cancel or reschedule your appointment, please contact [facility] [place] - A DEPT OF Eligha Bridegroom [facility] [phone]  and follow the prompts.  Office hours are 8:00 a.m. to 4:30 p.m. Monday - Friday. Please note that voicemails left after 4:00 p.m. may not be returned until the following  business day.  We are closed weekends and major holidays. You have access to a nurse at all times for urgent questions. Please call the main number to the clinic [phone] and follow the prompts.  For any non-urgent questions, you may also contact your provider using MyChart. We now offer e-Visits for anyone 77 and older to request care online for non-urgent symptoms. For details visit [email or web address].   Also download the MyChart app! Go to the app store, search "MyChart", open the app, select [place], and log in with your MyChart username and password.

## 2023-07-20 NOTE — Progress Notes (Signed)
Hypersensitivity Reaction note  Date of event: 07/20/23 Time of event: 1606 Generic name of drug involved: Ferrlecit Name of provider notified of the hypersensitivity reaction: Doreatha Massed, MD Was agent that likely caused hypersensitivity reaction added to Allergies List within EMR? yes Chain of events including reaction signs/symptoms, treatment administered, and outcome (e.g., drug resumed; drug discontinued; sent to Emergency Department; etc.)   1606: Patient c/o itching all over and throat tightness. No respiratory distress noted by RN.    1608:  Dr. Ellin Saba at bedside.  Verbal order for Solumedrol 125 mg IV x one dose.     1610:  Vitals WNL   1611:  Solu-medrol administered.  Restart iron if itching resolves per Dr. Ellin Saba.   1620:  Itching has improved, but not resolved.   1630:  Patient states that itching has almost completely resolved.  Iron restarted per orders by Dr. Ellin Saba.  1652:  Iron completed.  Patient denied any further symptoms.  Patient refused to wait the recommended 30 minute post iron wait time.  Patient advised to report to ED with any recurrent or worsening symptoms.  Vital signs remained stable.  Patient left ambulatory in stable condition.    Peggye Pitt, RN 07/20/2023 4:39 PM

## 2023-07-23 DIAGNOSIS — J449 Chronic obstructive pulmonary disease, unspecified: Secondary | ICD-10-CM | POA: Diagnosis not present

## 2023-07-23 DIAGNOSIS — J9611 Chronic respiratory failure with hypoxia: Secondary | ICD-10-CM | POA: Diagnosis not present

## 2023-07-24 ENCOUNTER — Encounter: Payer: Self-pay | Admitting: Physician Assistant

## 2023-07-24 ENCOUNTER — Ambulatory Visit: Payer: Self-pay | Admitting: *Deleted

## 2023-07-24 NOTE — Patient Outreach (Signed)
  Care Coordination   Follow Up Visit Note   07/24/2023 Name: Kayla Ryan MRN: [id number] DOB: 25-Nov-1955  Kayla Ryan is a 67 y.o. year old female who sees Anabel Halon, MD for primary care. I spoke with  Kayla Ryan by phone today.  What matters to the patients health and wellness today?  Pain all over this morning Infusions tolerating   CT/MRI completed 07/11/23 Explain her recent CT scan impression    Goals Addressed             This Visit's Progress    Patient will be able to have home management of her chronic pain, IBS/Crohn's, COPD,diabetes, finances- RN CM services       Patient will be able to attend all her medical appointments- Missed 07/07/23 AP CT visit "forgot' Patient will have decreased pulmonary worsening symptoms - 07/10/23 confirmed her antibiotics has cause improvements as evidence by her secretions no longer being green but clear on 07/10/23 Patient will continue to follow the treatment plan of her pain management providers to maintain her pain at pain level of 5 or below as evidence by patient voiced pain levels   Interventions Today    Flowsheet Row Most Recent Value  Chronic Disease   Chronic disease during today's visit Other, Chronic Obstructive Pulmonary Disease (COPD)  [IBS, Pain all over, CT results reviewed , carrying heaavy items]  General Interventions   General Interventions Discussed/Reviewed General Interventions Reviewed, Labs, Doctor Visits, [facility], Horticulturist, commercial (DME)  Labs --  [07/11/23 imaging impression explained, questions answered]  Doctor Visits Discussed/Reviewed Doctor Visits Reviewed, PCP, Specialist  Durable Medical Equipment (DME) Dan Humphreys, Glucomoter, Oxygen  PCP/Specialist Visits Compliance with follow-up visit  Mental Health Interventions   Mental Health Discussed/Reviewed Mental Health Reviewed, Coping Strategies              SDOH assessments and interventions completed:  No      Care Coordination Interventions:  Yes, provided   Follow up plan: Follow up call scheduled for 09/04/23    Encounter Outcome:  Patient Visit Completed   Kayla Bradford L. Noelle Penner, RN, BSN, CCM [facility] RN Care Manager [phone]

## 2023-07-25 ENCOUNTER — Other Ambulatory Visit: Payer: Self-pay | Admitting: Internal Medicine

## 2023-07-25 ENCOUNTER — Encounter: Payer: Self-pay | Admitting: Hematology

## 2023-07-25 ENCOUNTER — Other Ambulatory Visit: Payer: Self-pay | Admitting: Physician Assistant

## 2023-07-25 DIAGNOSIS — J439 Emphysema, unspecified: Secondary | ICD-10-CM

## 2023-07-25 NOTE — Progress Notes (Signed)
Notified by nursing staff that patient had mild reaction (itching and throat tightness without respiratory distress) to her IV Ferrlecit treatment on 07/20/2023.  She was given IV Solu-Medrol 125 mg x 1 dose, with resolution of symptoms.  She was then able to complete iron infusion.  I discussed with patient via MyChart message that I would like to add SoluMedrol to her premedications.  She had previously opposed this suggestion due to concerns regarding side effects (i.e. mood irritability), but in light of her most recent episode, she is now agreeable to receiving steroid premedication with each iron infusion.  Carnella Guadalajara, PA-C 07/25/23 1:36 PM

## 2023-07-27 ENCOUNTER — Inpatient Hospital Stay: Payer: 59

## 2023-07-27 VITALS — BP 145/59 | HR 93 | Temp 97.5°F | Resp 18

## 2023-07-27 DIAGNOSIS — D500 Iron deficiency anemia secondary to blood loss (chronic): Secondary | ICD-10-CM

## 2023-07-27 DIAGNOSIS — D509 Iron deficiency anemia, unspecified: Secondary | ICD-10-CM | POA: Diagnosis not present

## 2023-07-27 MED ORDER — SODIUM CHLORIDE 0.9 % IV SOLN
125.0000 mg | Freq: Once | INTRAVENOUS | Status: AC
  Administered 2023-07-27: 125 mg via INTRAVENOUS
  Filled 2023-07-27: qty 10

## 2023-07-27 MED ORDER — METHYLPREDNISOLONE SODIUM SUCC 125 MG IJ SOLR
125.0000 mg | Freq: Once | INTRAMUSCULAR | Status: AC
  Administered 2023-07-27: 125 mg via INTRAVENOUS
  Filled 2023-07-27: qty 2

## 2023-07-27 MED ORDER — FAMOTIDINE IN NACL 20-0.9 MG/50ML-% IV SOLN
20.0000 mg | Freq: Once | INTRAVENOUS | Status: AC
  Administered 2023-07-27: 20 mg via INTRAVENOUS
  Filled 2023-07-27: qty 50

## 2023-07-27 MED ORDER — CETIRIZINE HCL 10 MG/ML IV SOLN
10.0000 mg | Freq: Once | INTRAVENOUS | Status: AC
  Administered 2023-07-27: 10 mg via INTRAVENOUS
  Filled 2023-07-27: qty 1

## 2023-07-27 MED ORDER — SODIUM CHLORIDE 0.9 % IV SOLN: INTRAVENOUS | Status: DC

## 2023-07-27 MED ORDER — ONDANSETRON HCL 4 MG/2ML IJ SOLN
8.0000 mg | Freq: Once | INTRAMUSCULAR | Status: AC
  Administered 2023-07-27: 8 mg via INTRAVENOUS
  Filled 2023-07-27: qty 4

## 2023-07-27 MED ORDER — ACETAMINOPHEN 325 MG PO TABS
650.0000 mg | ORAL_TABLET | Freq: Once | ORAL | Status: AC
Start: 2023-07-27 — End: 2023-07-27
  Administered 2023-07-27: 650 mg via ORAL
  Filled 2023-07-27: qty 2

## 2023-07-27 NOTE — Progress Notes (Signed)
Patient presents today for Ferrlecit infusion per providers order.  Vital signs WNL.  Patient has no new complaints at this time.  Peripheral IV started and blood return noted pre infusion.  1205 patient complaining of itching.  Infusion stopped. 1210 vital signs 137/70, HR 87, O2 96%, temp 98.8 1212 Saline infusing 1220 PA in with patient.  Per PA do not restart Ferrlecit will come back for scheduled labs and discussion on next steps with iron.  Vital signs stable.  No complaints at this time.  Discharge from clinic ambulatory in stable condition.  Alert and oriented X 3.  Follow up with [facility] as scheduled.

## 2023-07-27 NOTE — Progress Notes (Signed)
Hypersensitivity Reaction note  Date of event: 07/27/23 Time of event: 1205 Generic name of drug involved: Ferric gluconate Name of provider notified of the hypersensitivity reaction: Rojelio Brenner  PA Was agent that likely caused hypersensitivity reaction added to Allergies List within EMR? It was already listed as an allergy Chain of events including reaction signs/symptoms, treatment administered, and outcome (e.g., drug resumed; drug discontinued; sent to Emergency Department; etc.)   1205 patient complaining of itching.  Infusion stopped. 1210 vital signs 137/70, HR 87, O2 96%, temp 98.8 1212 Saline infusing 1220 PA in with patient.  Per PA do not restart Ferrlecit will come back for scheduled labs and discussion on next steps with iron.  Vital signs stable.  No complaints at this time.  Discharge from clinic ambulatory in stable condition.  Alert and oriented X 3.  Follow up with [facility] as scheduled.   Worthy Rancher, RN 07/27/2023 1:24 PM

## 2023-07-27 NOTE — Patient Instructions (Signed)

## 2023-07-30 ENCOUNTER — Other Ambulatory Visit: Payer: Self-pay | Admitting: Internal Medicine

## 2023-07-30 ENCOUNTER — Other Ambulatory Visit: Payer: Self-pay | Admitting: Emergency Medicine

## 2023-07-30 DIAGNOSIS — F411 Generalized anxiety disorder: Secondary | ICD-10-CM

## 2023-08-03 ENCOUNTER — Telehealth: Payer: Self-pay | Admitting: Cardiology

## 2023-08-03 ENCOUNTER — Inpatient Hospital Stay: Payer: 59

## 2023-08-03 MED ORDER — NITROGLYCERIN 0.4 MG SL SUBL: 0.4000 mg | SUBLINGUAL_TABLET | SUBLINGUAL | 3 refills | Status: AC | PRN

## 2023-08-03 NOTE — Telephone Encounter (Signed)
Are you okay with filling? Previous Rx has expired

## 2023-08-03 NOTE — Telephone Encounter (Signed)
Filled

## 2023-08-03 NOTE — Telephone Encounter (Signed)
*  STAT* If patient is at the pharmacy, call can be transferred to refill team.   1. Which medications need to be refilled? (please list name of each medication and dose if known) new prescription for  Nitroglycerin 0.4 mg Stst   2. Would you like to learn more about the convenience, safety, & potential cost savings by using the [facility]?      3. Are you open to using the [facility] (Type [facility]   4. Which pharmacy/location (including street and city if local pharmacy) is medication to be sent to? [facility]  [address], [place]  5. Do they need a 30 day or 90 day supply? Patient said this be the time for Dr Diona Browner filling this prescription- The original prescription came from her old doctor that she no longer seed

## 2023-08-04 ENCOUNTER — Other Ambulatory Visit: Payer: Self-pay | Admitting: Internal Medicine

## 2023-08-07 DIAGNOSIS — J439 Emphysema, unspecified: Secondary | ICD-10-CM | POA: Diagnosis not present

## 2023-08-07 DIAGNOSIS — J449 Chronic obstructive pulmonary disease, unspecified: Secondary | ICD-10-CM | POA: Diagnosis not present

## 2023-08-09 ENCOUNTER — Other Ambulatory Visit: Payer: Self-pay | Admitting: Internal Medicine

## 2023-08-14 ENCOUNTER — Encounter: Payer: Self-pay | Admitting: Internal Medicine

## 2023-08-14 ENCOUNTER — Ambulatory Visit (INDEPENDENT_AMBULATORY_CARE_PROVIDER_SITE_OTHER): Payer: 59 | Admitting: Internal Medicine

## 2023-08-14 VITALS — BP 119/63 | HR 91 | Ht 68.0 in | Wt 164.8 lb

## 2023-08-14 DIAGNOSIS — F5101 Primary insomnia: Secondary | ICD-10-CM | POA: Diagnosis not present

## 2023-08-14 DIAGNOSIS — R1319 Other dysphagia: Secondary | ICD-10-CM | POA: Diagnosis not present

## 2023-08-14 DIAGNOSIS — J449 Chronic obstructive pulmonary disease, unspecified: Secondary | ICD-10-CM | POA: Diagnosis not present

## 2023-08-14 DIAGNOSIS — J9611 Chronic respiratory failure with hypoxia: Secondary | ICD-10-CM | POA: Diagnosis not present

## 2023-08-14 DIAGNOSIS — I10 Essential (primary) hypertension: Secondary | ICD-10-CM | POA: Diagnosis not present

## 2023-08-14 DIAGNOSIS — K5520 Angiodysplasia of colon without hemorrhage: Secondary | ICD-10-CM | POA: Diagnosis not present

## 2023-08-14 DIAGNOSIS — E1149 Type 2 diabetes mellitus with other diabetic neurological complication: Secondary | ICD-10-CM

## 2023-08-14 DIAGNOSIS — I5032 Chronic diastolic (congestive) heart failure: Secondary | ICD-10-CM | POA: Diagnosis not present

## 2023-08-14 DIAGNOSIS — D500 Iron deficiency anemia secondary to blood loss (chronic): Secondary | ICD-10-CM

## 2023-08-14 DIAGNOSIS — I209 Angina pectoris, unspecified: Secondary | ICD-10-CM | POA: Diagnosis not present

## 2023-08-14 DIAGNOSIS — D509 Iron deficiency anemia, unspecified: Secondary | ICD-10-CM

## 2023-08-14 DIAGNOSIS — I700 Atherosclerosis of aorta: Secondary | ICD-10-CM | POA: Diagnosis not present

## 2023-08-14 DIAGNOSIS — Z13820 Encounter for screening for osteoporosis: Secondary | ICD-10-CM

## 2023-08-14 DIAGNOSIS — F411 Generalized anxiety disorder: Secondary | ICD-10-CM

## 2023-08-14 NOTE — Assessment & Plan Note (Signed)
Due to COPD Followed by Pulmonology Has 3 lpm home O2 for chronic hypoxia 

## 2023-08-14 NOTE — Progress Notes (Signed)
 Established Patient Office Visit  Subjective:  Patient ID: Kayla Ryan, female    DOB: 1956-03-19  Age: 68 y.o. MRN: [id number]  CC:  Chief Complaint  Patient presents with   Diabetes    Four month follow up    Stool Color Change    Coffee colored stools    HPI Kayla Ryan is a 68 y.o. female with past medical history of HTN, HFpEF, COPD, GERD, type II DM with neuropathy, CKD, GAD and chronic pain syndrome who presents for f/u of her chronic medical conditions.  Type II DM: She has been taking Mounjaro , and has been tolerating it well.  Her blood glucose has improved now, denies episodes of hypoglycemia now. She has a CGM - 98% within target range, <1% episode of hypoglycemia.  She is tolerating Jardiance  now.  She has chronic numbness of bilateral foot.  She takes gabapentin  800 mg TID currently.  Denies any polyuria or polyphagia.  She has chronic fatigue.  GAD: She takes Ativan  0.5 mg 3 times daily as needed. She is also on Cymbalta  60 mg BID.   She has history of COPD and chronic hypoxic respiratory failure.  She has acute Stiolto and as needed albuterol .  She uses 3 LPM O2 at home.  She has chronic low back pain, followed by pain clinic.  She takes Cymbalta  60 mg twice daily, gabapentin  800 mg 3 times daily and Norco 10-325 mg q4h PRN. She also has Robaxin  as needed for muscle spasms.  Denies any recent fall or injury.  Denies saddle anesthesia, urinary or stool incontinence.  She reports coffee-colored stools for the last 2 weeks.  She has history of AVM of small bowel, which had led to chronic GI bleeding in the past.  She has not seen GI since 2023.  Denies hematemesis or hematochezia.  She takes omeprazole  for GERD.  She reports excessive sweating at times with exertion.  Denies any fever, chills, recent unintentional weight loss, LAD or hemoptysis.  Past Medical History:  Diagnosis Date   Allergy Unknown   Anemia    Anxiety 1883   Arthritis    Asthma    CHF  (congestive heart failure) (HCC) 2021   Chronic bronchitis    Crohn disease (HCC)    Emphysema    Emphysema of lung (HCC) 2003   Fibromyalgia    GERD (gastroesophageal reflux disease)    GI bleed    Herpes    History of blood transfusion    Hyperlipidemia    Hypertension 2015   IBS (irritable bowel syndrome)    Kidney failure    Migraines    Muscular dystrophy (HCC)    Neck pain    Oxygen  deficiency 2021   PAF (paroxysmal atrial fibrillation) (HCC)    Not anticoagulated with history of severe GI bleeding   Plantar fasciitis    Polymyositis (HCC)    PONV (postoperative nausea and vomiting)    Right thyroid  nodule 04/07/2022   Scoliosis    Type 2 diabetes mellitus (HCC)     Past Surgical History:  Procedure Laterality Date   ABDOMINAL HYSTERECTOMY     AGILE CAPSULE N/A 06/24/2021   Procedure: AGILE CAPSULE;  Surgeon: Cindie Carlin POUR, DO;  Location: [facility];  Service: Endoscopy;  Laterality: N/A;   AGILE CAPSULE N/A 07/22/2021   Procedure: AGILE CAPSULE;  Surgeon: Shaaron Lamar HERO, MD;  Location: [facility];  Service: Endoscopy;  Laterality: N/A;  7:30am   bone spur  BREAST SURGERY  1995   CHOLECYSTECTOMY     COLONOSCOPY WITH PROPOFOL  N/A 04/30/2021   Procedure: COLONOSCOPY WITH PROPOFOL ;  Surgeon: Shaaron Lamar HERO, MD;  [facility]: [facility];  Service: Endoscopy;  Laterality: N/A;   ESOPHAGEAL DILATION N/A 04/30/2021   Procedure: ESOPHAGEAL DILATION;  Surgeon: Shaaron Lamar HERO, MD;  [facility]: [facility];  Service: Endoscopy;  Laterality: N/A;   ESOPHAGOGASTRODUODENOSCOPY (EGD) WITH PROPOFOL  N/A 04/30/2021   Procedure: ESOPHAGOGASTRODUODENOSCOPY (EGD) WITH PROPOFOL ;  Surgeon: Shaaron Lamar HERO, MD;  [facility]: [facility];  Service: Endoscopy;  Laterality: N/A;   GIVENS CAPSULE STUDY N/A 09/22/2021   Procedure: GIVENS CAPSULE STUDY;  Surgeon: Shaaron Lamar HERO, MD;  Location: [facility];  Service: Endoscopy;  Laterality: N/A;  7:30am   HERNIA REPAIR      LEFT HEART CATHETERIZATION WITH CORONARY ANGIOGRAM N/A 11/07/2013   Procedure: LEFT HEART CATHETERIZATION WITH CORONARY ANGIOGRAM;  Surgeon: Dorn JINNY Lesches, MD;  Location: [facility];  Service: Cardiovascular;  Laterality: N/A;   MASTECTOMY PARTIAL / LUMPECTOMY     OOPHORECTOMY     ROTATOR CUFF REPAIR     TOOTH EXTRACTION N/A 04/15/2022   Procedure: DENTAL RESTORATION/EXTRACTIONS;  Surgeon: Sheryle Hamilton, DMD;  Location: [facility];  Service: Oral Surgery;  Laterality: N/A;    Family History  Problem Relation Age of Onset   Lung cancer Mother    Cancer Mother    Miscarriages / Stillbirths Mother    Varicose Veins Mother    Hypertension Mother    COPD Father    Lung cancer Father    Lymphoma Father    Alcohol  abuse Father    Arthritis Father    Anxiety disorder Sister    Depression Brother    Anxiety disorder Sister    Intellectual disability Sister     Social History   Socioeconomic History   Marital status: Widowed    Spouse name: Not on file   Number of children: Not on file   Years of education: Not on file   Highest education level: Associate degree: occupational, scientist, product/process development, or vocational program  Occupational History   Not on file  Tobacco Use   Smoking status: Former    Current packs/day: 0.00    Average packs/day: 1 pack/day for 49.0 years (49.0 ttl pk-yrs)    Types: Cigarettes    Start date: 08/08/1966    Quit date: 08/31/2005    Years since quitting: 17.9   Smokeless tobacco: Never  Vaping Use   Vaping status: Never Used  Substance and Sexual Activity   Alcohol  use: No   Drug use: No    Comment: used marijuana in teens   Sexual activity: Not Currently    Birth control/protection: Surgical  Other Topics Concern   Not on file  Social History Narrative   Are you right handed or left handed? Left Handed    Are you currently employed ? Retired    What is your current occupation?   Do you live at home alone? Yes   Who lives with you?    What type of home do  you live in: 1 story or 2 story? Lives in a one story home        Social Drivers of Health   Financial Resource Strain: Medium Risk (08/10/2023)   Overall Financial Resource Strain (CARDIA)    Difficulty of Paying Living Expenses: Somewhat hard  Food Insecurity: Food Insecurity Present (08/10/2023)   Hunger Vital Sign    Worried  About Running Out of Food in the Last Year: Often true    Ran Out of Food in the Last Year: Sometimes true  Transportation Needs: No Transportation Needs (08/10/2023)   PRAPARE - Administrator, Civil Service (Medical): No    Lack of Transportation (Non-Medical): No  Physical Activity: Insufficiently Active (08/10/2023)   Exercise Vital Sign    Days of Exercise per Week: 7 days    Minutes of Exercise per Session: 20 min  Stress: Stress Concern Present (08/10/2023)   [employer] of Occupational Health - Occupational Stress Questionnaire    Feeling of Stress : To some extent  Social Connections: Unknown (08/10/2023)   Social Connection and Isolation Panel [NHANES]    Frequency of Communication with Friends and Family: More than three times a week    Frequency of Social Gatherings with Friends and Family: Patient declined    Attends Religious Services: Patient declined    Database Administrator or Organizations: Patient declined    Attends Banker Meetings: Never    Marital Status: Widowed  Intimate Partner Violence: Not At Risk (04/17/2023)   Humiliation, Afraid, Rape, and Kick questionnaire    Fear of Current or Ex-Partner: No    Emotionally Abused: No    Physically Abused: No    Sexually Abused: No    Outpatient Medications Prior to Visit  Medication Sig Dispense Refill   ACCU-CHEK GUIDE test strip USE TO CHECK BLOOD GLUCOSE THREE TIMES DAILY, MORNING, AT NOON, AND AT BEDTIME 100 strip 0   Accu-Chek Softclix Lancets lancets USE TO TEST BLOOD SUGAR EVERY MORNING, AT NOON AND EVERY NIGHT AT BEDTIME 100 each 0   acyclovir  (ZOVIRAX ) 400  MG tablet TAKE 1 TABLET(400 MG) BY MOUTH THREE TIMES DAILY 30 tablet 3   albuterol  (PROVENTIL ) (2.5 MG/3ML) 0.083% nebulizer solution Take 3 mLs (2.5 mg total) by nebulization every 6 (six) hours as needed for wheezing or shortness of breath. 75 mL 5   ASPERCREME LIDOCAINE  EX Apply topically.     aspirin  EC 81 MG tablet Take 81 mg by mouth daily. Swallow whole.     budesonide  (PULMICORT ) 0.5 MG/2ML nebulizer solution Take 2 mLs (0.5 mg total) by nebulization in the morning and at bedtime. 120 mL 12   cetirizine  (ZYRTEC ) 10 MG tablet Take 10 mg by mouth 2 (two) times daily.     Continuous Glucose Receiver (DEXCOM G7 RECEIVER) DEVI USE TO CHECK BLOOD GLUCOSE AS NEEDED 1 each 0   Continuous Glucose Sensor (DEXCOM G7 SENSOR) MISC USE TO CHECK BLOOD SUGAR THREE TIMES DAILY, BEFORE MEALS AND AT BEDTIME AND AS NEEDED. CHANGE SENSOR EVERY 10 DAYS 3 each 5   dicyclomine  (BENTYL ) 10 MG capsule Take 1 capsule (10 mg total) by mouth 4 (four) times daily -  before meals and at bedtime. Monitor for constipation, dry mouth, dizziness (Patient taking differently: Take 10 mg by mouth 4 (four) times daily as needed (Bowel Spasm/Diarrhea). Monitor for constipation, dry mouth, dizziness) 120 capsule 3   diphenhydrAMINE  (BENADRYL ) 25 MG tablet Take 25 mg by mouth every 4 (four) hours as needed for allergies or itching.     doxepin  (SINEQUAN ) 10 MG capsule TAKE 1 CAPSULE(10 MG) BY MOUTH AT BEDTIME 30 capsule 3   DULoxetine  (CYMBALTA ) 60 MG capsule TAKE 1 CAPSULE(60 MG) BY MOUTH TWICE DAILY 60 capsule 3   ezetimibe  (ZETIA ) 10 MG tablet Take 1 tablet (10 mg total) by mouth daily. 90 tablet 3   gabapentin  (  NEURONTIN ) 800 MG tablet TAKE 1 TABLET(800 MG) BY MOUTH THREE TIMES DAILY 90 tablet 5   Glucagon  (GVOKE HYPOPEN  2-PACK) 1 MG/0.2ML SOAJ Inject 0.2 mLs into the skin as needed (Blood glucose < 53). 0.4 mL 5   Glycerin-Hypromellose-PEG 400 (DRY EYE RELIEF DROPS OP) Place 1 drop into both eyes 3 (three) times daily as needed  (Dry eye).     HYDROcodone  bit-homatropine (HYCODAN) 5-1.5 MG/5ML syrup Take 5 mLs by mouth every 6 (six) hours as needed for cough. 120 mL 0   HYDROcodone -acetaminophen  (NORCO) 10-325 MG tablet Take 1 tablet by mouth every 4 (four) hours as needed. Stop Hydrocodone  7.5/325mg  tablet. 180 tablet 0   ipratropium (ATROVENT  HFA) 17 MCG/ACT inhaler Inhale 2 puffs into the lungs every 4 (four) hours as needed (COPD). 1 each 4   isosorbide  dinitrate (ISORDIL ) 30 MG tablet TAKE 1 TABLET(30 MG) BY MOUTH DAILY 90 tablet 1   JARDIANCE  10 MG TABS tablet TAKE 1 TABLET(10 MG) BY MOUTH DAILY BEFORE BREAKFAST 30 tablet 5   Lancets Misc. (ACCU-CHEK SOFTCLIX LANCET DEV) KIT USE TO CHECK GLUCOSE THREE TIMES DAILY 1 kit 0   LORazepam  (ATIVAN ) 0.5 MG tablet TAKE 1 TABLET(0.5 MG) BY MOUTH THREE TIMES DAILY AS NEEDED FOR ANXIETY 90 tablet 3   meclizine  (ANTIVERT ) 25 MG tablet TAKE 1 TABLET(25 MG) BY MOUTH THREE TIMES DAILY AS NEEDED FOR DIZZINESS 30 tablet 1   methocarbamol  (ROBAXIN ) 500 MG tablet TAKE 1 TABLET(500 MG) BY MOUTH EVERY 8 HOURS AS NEEDED FOR MUSCLE SPASMS 30 tablet 1   Misc. Devices KIT Henmnii rollator walker 1 kit 0   montelukast  (SINGULAIR ) 10 MG tablet TAKE 1 TABLET(10 MG) BY MOUTH AT BEDTIME 30 tablet 3   nitroGLYCERIN  (NITROSTAT ) 0.4 MG SL tablet Place 1 tablet (0.4 mg total) under the tongue every 5 (five) minutes as needed for chest pain. 90 tablet 3   omeprazole  (PRILOSEC) 40 MG capsule TAKE 1 CAPSULE(40 MG) BY MOUTH TWICE DAILY 180 capsule 0   ondansetron  (ZOFRAN -ODT) 8 MG disintegrating tablet DISSOLVE 1 TABLET(8 MG) ON THE TONGUE EVERY 8 HOURS AS NEEDED FOR NAUSEA OR VOMITING 257 tablet 1   STIOLTO RESPIMAT  2.5-2.5 MCG/ACT AERS INHALE 2 PUFFS INTO THE LUNGS DAILY 4 g 11   tirzepatide  (MOUNJARO ) 5 MG/0.5ML Pen Inject 5 mg into the skin once a week. 6 mL 1   ciprofloxacin  (CIPRO ) tablet 500 mg      No facility-administered medications prior to visit.    Allergies  Allergen Reactions   Sulfa  Antibiotics Shortness Of Breath, Swelling and Rash   Clindamycin /Lincomycin    Dilaudid  [Hydromorphone  Hcl]     Makes me crazy   Ferrlecit [Na Ferric Gluc Cplx In Sucrose] Itching    Itching and throat tightness   Iron      Throat starts closing up, tongue swells, severe headaches and backpain   Metronidazole Diarrhea and Nausea And Vomiting   Prednisone  Other (See Comments)    Angry     Statins Other (See Comments)    Unknown    Cephalexin Diarrhea and Nausea And Vomiting   Methotrexate Derivatives Swelling and Rash   Morphine  And Codeine Anxiety    exreme mood swings    ROS Review of Systems  Constitutional:  Positive for fatigue. Negative for chills and fever.  HENT:  Negative for congestion, sinus pressure, sinus pain and sore throat.   Eyes:  Negative for pain and discharge.  Respiratory:  Positive for shortness of breath. Negative for cough.  Cardiovascular:  Negative for chest pain and palpitations.  Gastrointestinal:  Positive for constipation. Negative for abdominal pain, nausea and vomiting.  Endocrine: Negative for polydipsia and polyuria.  Genitourinary:  Negative for dysuria and hematuria.  Musculoskeletal:  Positive for arthralgias, back pain, gait problem and neck pain. Negative for neck stiffness.  Skin:  Negative for rash.  Neurological:  Negative for dizziness and weakness.  Psychiatric/Behavioral:  Positive for sleep disturbance. Negative for agitation and behavioral problems. The patient is nervous/anxious.       Objective:    Physical Exam Vitals reviewed.  Constitutional:      General: She is not in acute distress.    Appearance: She is not diaphoretic.  HENT:     Head: Normocephalic and atraumatic.     Nose: No congestion.     Mouth/Throat:     Mouth: Mucous membranes are moist.  Eyes:     General: No scleral icterus.    Extraocular Movements: Extraocular movements intact.  Neck:     Comments: Right-sided neck mass - thyroid  nodule,  nontender, about 3 cm in diameter Cardiovascular:     Rate and Rhythm: Normal rate and regular rhythm.     Pulses: Normal pulses.     Heart sounds: Normal heart sounds. No murmur heard. Pulmonary:     Breath sounds: Normal breath sounds. No wheezing or rales.     Comments: On 3 LPM O2 Musculoskeletal:     Cervical back: Neck supple. No tenderness.     Right lower leg: No edema.     Left lower leg: No edema.  Skin:    General: Skin is warm.     Findings: No rash.  Neurological:     General: No focal deficit present.     Mental Status: She is alert and oriented to person, place, and time.     Sensory: Sensory deficit present.     Motor: Weakness (4/5 in b/l UE and LE) present.  Psychiatric:        Mood and Affect: Mood normal.        Behavior: Behavior normal.     BP 119/63 (BP Location: Right Arm, Patient Position: Sitting, Cuff Size: Normal)   Pulse 91   Ht 5' 8 (1.727 m)   Wt 164 lb 12.8 oz (74.8 kg)   SpO2 93%   BMI 25.06 kg/m  Wt Readings from Last 3 Encounters:  08/14/23 164 lb 12.8 oz (74.8 kg)  07/13/23 171 lb (77.6 kg)  05/30/23 169 lb 12.8 oz (77 kg)    Lab Results  Component Value Date   TSH 3.800 09/01/2022   Lab Results  Component Value Date   WBC 6.7 08/14/2023   HGB 12.9 08/14/2023   HCT 40.8 08/14/2023   MCV 91 08/14/2023   PLT 365 08/14/2023   Lab Results  Component Value Date   NA 139 08/14/2023   K 4.2 08/14/2023   CO2 27 08/14/2023   GLUCOSE 70 08/14/2023   BUN 19 08/14/2023   CREATININE 0.85 08/14/2023   BILITOT 0.2 08/14/2023   ALKPHOS 114 08/14/2023   AST 52 (H) 08/14/2023   ALT 84 (H) 08/14/2023   PROT 7.4 08/14/2023   ALBUMIN 4.8 08/14/2023   CALCIUM  9.6 08/14/2023   ANIONGAP 13 04/15/2022   EGFR 74 08/14/2023   GFR 58.40 (L) 01/28/2021   Lab Results  Component Value Date   CHOL 200 (H) 12/08/2022   Lab Results  Component Value Date   HDL 46 12/08/2022  Lab Results  Component Value Date   LDLCALC 116 (H)  12/08/2022   Lab Results  Component Value Date   TRIG 216 (H) 12/08/2022   Lab Results  Component Value Date   CHOLHDL 4.3 12/08/2022   Lab Results  Component Value Date   HGBA1C 5.5 08/14/2023      Assessment & Plan:   Problem List Items Addressed This Visit       Cardiovascular and Mediastinum   Angina pectoris (HCC)   Has had episodes of atypical chest pain in the past, followed by cardiology On Imdur  currently Episodes of excessive sweating could be due to CAD related angina - advised to try taking nitroglycerin  during such episodes, if relief of symptoms - she would need to be evaluated by Cardiology      Essential hypertension   BP Readings from Last 1 Encounters:  08/14/23 119/63   Well-controlled with Imdur  (for CAD/HFpEF) Counseled for compliance with the medications Advised DASH diet and moderate exercise/walking as tolerated      (HFpEF) heart failure with preserved ejection fraction (HCC)   Had acute CHF exacerbation in the past Currently euvolemic Was on Farxiga , but could not tolerate it - On Jardiance  now Followed by Cardiology      AVM (arteriovenous malformation) of small bowel, acquired   History of AVM related chronic GI bleeding Considering current concern for coffee colored stools, advised to contact GI for evaluation Check CBC - has chronic IDA, followed by Hematology      Atherosclerosis of aorta (HCC)   Noted on CT chest Does not tolerate statin On Zetia         Respiratory   Chronic obstructive pulmonary disease (HCC)   Well controlled with Stiolto and as needed albuterol  Followed by Pulmonology Has 3 lpm home O2 for chronic hypoxia      Chronic respiratory failure with hypoxia (HCC)   Due to COPD Followed by Pulmonology Has 3 lpm home O2 for chronic hypoxia        Digestive   Esophageal dysphagia   Followed by GI - needs to schedule visit On Omeprazole  40 mg QD        Endocrine   Type 2 diabetes mellitus with  neurological complications (HCC) - Primary   Lab Results  Component Value Date   HGBA1C 5.5 04/11/2023   Well controlled now On Mounjaro  5 mg qw Has had episodes of hypoglycemia in the past, has CGM now, no recent episode of hypoglycemia  On Jardiance  for HFpEF (did not tolerate Farxiga  10 mg) Advised to follow diabetic diet Has statin intolerance F/u CMP and lipid panel Diabetic eye exam: Advised to follow up with Ophthalmology for diabetic eye exam  Takes gabapentin  800 mg TID, she has noticed improvement in numbness and tingling of bilateral LE      Relevant Orders   CMP14+EGFR (Completed)   Hemoglobin A1c (Completed)   Urine Microalbumin w/creat. ratio (Completed)     Other   GAD (generalized anxiety disorder) (Chronic)   Takes Ativan  0.5 mg TID PRN - refilled, PDMP reviewed On Cymbalta  60 mg BID - for neuropathy as well      Iron  deficiency anemia secondary to blood loss (chronic)   Followed by hematology Has had Venofer , takes oral iron  supplement Likely due to occult GI bleeding - advised to contact GI      Relevant Orders   CBC with Differential/Platelet (Completed)   Primary insomnia   Well-controlled with Doxepin  Takes Ativan  PRN for anxiety  Other Visit Diagnoses       Osteoporosis screening       Relevant Orders   DG Bone Density       No orders of the defined types were placed in this encounter.   Follow-up: Return in about 4 months (around 12/12/2023) for Annual physical (after 12/08/23).    Suzzane MARLA Blanch, MD

## 2023-08-14 NOTE — Assessment & Plan Note (Signed)
 Had acute CHF exacerbation in the past Currently euvolemic Was on Farxiga, but could not tolerate it - On Jardiance now Followed by Cardiology

## 2023-08-14 NOTE — Assessment & Plan Note (Signed)
 Lab Results  Component Value Date   HGBA1C 5.5 04/11/2023   Well controlled now On Mounjaro  5 mg qw Has had episodes of hypoglycemia in the past, has CGM now, no recent episode of hypoglycemia  On Jardiance  for HFpEF (did not tolerate Farxiga  10 mg) Advised to follow diabetic diet Has statin intolerance F/u CMP and lipid panel Diabetic eye exam: Advised to follow up with Ophthalmology for diabetic eye exam  Takes gabapentin  800 mg TID, she has noticed improvement in numbness and tingling of bilateral LE

## 2023-08-14 NOTE — Assessment & Plan Note (Signed)
Well controlled with Stiolto and as needed albuterol Followed by Pulmonology Has 3 lpm home O2 for chronic hypoxia

## 2023-08-14 NOTE — Assessment & Plan Note (Signed)
 Followed by GI - needs to schedule visit On Omeprazole 40 mg QD

## 2023-08-14 NOTE — Patient Instructions (Signed)
 Please continue to take medications as prescribed.  Please continue to follow low carb diet and ambulate as tolerated.  Please consider getting Shingrix and Tdap vaccine at local pharmacy.

## 2023-08-15 NOTE — Assessment & Plan Note (Signed)
 BP Readings from Last 1 Encounters:  08/14/23 119/63   Well-controlled with Imdur (for CAD/HFpEF) Counseled for compliance with the medications Advised DASH diet and moderate exercise/walking as tolerated

## 2023-08-15 NOTE — Assessment & Plan Note (Addendum)
 Followed by hematology Has had Venofer, takes oral iron supplement Likely due to occult GI bleeding - advised to contact GI

## 2023-08-15 NOTE — Assessment & Plan Note (Addendum)
 Well-controlled with Doxepin Takes Ativan PRN for anxiety

## 2023-08-15 NOTE — Assessment & Plan Note (Signed)
 Has had episodes of atypical chest pain in the past, followed by cardiology On Imdur  currently Episodes of excessive sweating could be due to CAD related angina - advised to try taking nitroglycerin  during such episodes, if relief of symptoms - she would need to be evaluated by Cardiology

## 2023-08-15 NOTE — Assessment & Plan Note (Signed)
 Takes Ativan 0.5 mg TID PRN - refilled, PDMP reviewed On Cymbalta 60 mg BID - for neuropathy as well

## 2023-08-15 NOTE — Assessment & Plan Note (Signed)
 History of AVM related chronic GI bleeding Considering current concern for coffee colored stools, advised to contact GI for evaluation Check CBC - has chronic IDA, followed by Hematology

## 2023-08-15 NOTE — Assessment & Plan Note (Signed)
Noted on CT chest Does not tolerate statin On Zetia

## 2023-08-16 LAB — CBC WITH DIFFERENTIAL/PLATELET
Basophils Absolute: 0.1 10*3/uL (ref 0.0–0.2)
Basos: 1 %
EOS (ABSOLUTE): 0.4 10*3/uL (ref 0.0–0.4)
Eos: 6 %
Hematocrit: 40.8 % (ref 34.0–46.6)
Hemoglobin: 12.9 g/dL (ref 11.1–15.9)
Immature Grans (Abs): 0 10*3/uL (ref 0.0–0.1)
Immature Granulocytes: 0 %
Lymphocytes Absolute: 1.6 10*3/uL (ref 0.7–3.1)
Lymphs: 24 %
MCH: 28.8 pg (ref 26.6–33.0)
MCHC: 31.6 g/dL (ref 31.5–35.7)
MCV: 91 fL (ref 79–97)
Monocytes Absolute: 0.5 10*3/uL (ref 0.1–0.9)
Monocytes: 7 %
Neutrophils Absolute: 4.2 10*3/uL (ref 1.4–7.0)
Neutrophils: 62 %
Platelets: 365 10*3/uL (ref 150–450)
RBC: 4.48 x10E6/uL (ref 3.77–5.28)
RDW: 13.3 % (ref 11.7–15.4)
WBC: 6.7 10*3/uL (ref 3.4–10.8)

## 2023-08-16 LAB — MICROALBUMIN / CREATININE URINE RATIO
Creatinine, Urine: 77.3 mg/dL
Microalb/Creat Ratio: 12 mg/g{creat} (ref 0–29)
Microalbumin, Urine: 9.6 ug/mL

## 2023-08-16 LAB — CMP14+EGFR
ALT: 84 [IU]/L — ABNORMAL HIGH (ref 0–32)
AST: 52 [IU]/L — ABNORMAL HIGH (ref 0–40)
Albumin: 4.8 g/dL (ref 3.9–4.9)
Alkaline Phosphatase: 114 [IU]/L (ref 44–121)
BUN/Creatinine Ratio: 22 (ref 12–28)
BUN: 19 mg/dL (ref 8–27)
Bilirubin Total: 0.2 mg/dL (ref 0.0–1.2)
CO2: 27 mmol/L (ref 20–29)
Calcium: 9.6 mg/dL (ref 8.7–10.3)
Chloride: 97 mmol/L (ref 96–106)
Creatinine, Ser: 0.85 mg/dL (ref 0.57–1.00)
Globulin, Total: 2.6 g/dL (ref 1.5–4.5)
Glucose: 70 mg/dL (ref 70–99)
Potassium: 4.2 mmol/L (ref 3.5–5.2)
Sodium: 139 mmol/L (ref 134–144)
Total Protein: 7.4 g/dL (ref 6.0–8.5)
eGFR: 74 mL/min/{1.73_m2} (ref >59)

## 2023-08-16 LAB — HEMOGLOBIN A1C
Est. average glucose Bld gHb Est-mCnc: 111 mg/dL
Hgb A1c MFr Bld: 5.5 % (ref 4.8–5.6)

## 2023-08-21 ENCOUNTER — Other Ambulatory Visit: Payer: Self-pay | Admitting: Internal Medicine

## 2023-08-21 DIAGNOSIS — I5032 Chronic diastolic (congestive) heart failure: Secondary | ICD-10-CM

## 2023-08-22 ENCOUNTER — Ambulatory Visit: Payer: 59 | Admitting: Emergency Medicine

## 2023-08-23 DIAGNOSIS — J9611 Chronic respiratory failure with hypoxia: Secondary | ICD-10-CM | POA: Diagnosis not present

## 2023-08-23 DIAGNOSIS — J449 Chronic obstructive pulmonary disease, unspecified: Secondary | ICD-10-CM | POA: Diagnosis not present

## 2023-08-28 ENCOUNTER — Other Ambulatory Visit: Payer: Self-pay | Admitting: Internal Medicine

## 2023-08-28 ENCOUNTER — Other Ambulatory Visit: Payer: Self-pay | Admitting: Emergency Medicine

## 2023-08-28 ENCOUNTER — Other Ambulatory Visit (HOSPITAL_COMMUNITY): Payer: 59

## 2023-08-28 ENCOUNTER — Ambulatory Visit (HOSPITAL_COMMUNITY): Payer: 59

## 2023-08-28 DIAGNOSIS — I25118 Atherosclerotic heart disease of native coronary artery with other forms of angina pectoris: Secondary | ICD-10-CM

## 2023-08-28 DIAGNOSIS — F411 Generalized anxiety disorder: Secondary | ICD-10-CM

## 2023-08-28 MED ORDER — LORAZEPAM 0.5 MG PO TABS: 0.5000 mg | ORAL_TABLET | Freq: Three times a day (TID) | ORAL | 3 refills | Status: DC | PRN

## 2023-08-28 MED ORDER — DOXEPIN HCL 10 MG PO CAPS: 10.0000 mg | ORAL_CAPSULE | Freq: Every day | ORAL | 3 refills | Status: DC

## 2023-08-28 NOTE — Telephone Encounter (Signed)
Copied from CRM [id number]. Topic: Clinical - Medication Refill >> Aug 28, 2023  2:28 PM Antony Haste wrote: Most Recent Primary Care Visit:  Provider: Anabel Halon  Department: [facility]  Visit Type: OFFICE VISIT  Date: 08/14/2023  Medication: ***  Has the patient contacted their pharmacy?  (Agent: If no, request that the patient contact the pharmacy for the refill. If patient does not wish to contact the pharmacy document the reason why and proceed with request.) (Agent: If yes, when and what did the pharmacy advise?)  Is this the correct pharmacy for this prescription?  If no, delete pharmacy and type the correct one.  This is the patient's preferred pharmacy:  [id number] - [place], [place] - [address] [place] [address] Phone: [phone] Fax: [phone]   Has the prescription been filled recently?   Is the patient out of the medication?   Has the patient been seen for an appointment in the last year OR does the patient have an upcoming appointment?   Can we respond through MyChart?   Agent: Please be advised that Rx refills may take up to 3 business days. We ask that you follow-up with your pharmacy.

## 2023-08-28 NOTE — Telephone Encounter (Signed)
Copied from CRM [id number]. Topic: Clinical - Prescription Issue >> Aug 28, 2023  2:29 PM Antony Haste wrote: Reason for CRM: This patient is needing omeprazole (PRILOSEC) 40 MG capsule, isosorbide dinitrate (ISORDIL) 30 MG tablet, and DULoxetine (CYMBALTA) 60 MG capsule to be signed off by her provider no later than 01/24, she spoke with her pharmacy and was advised it had been discontinued however it's showing its been reordered as of today on 01/20. She would also like montelukast (SINGULAIR) 10 MG tablet to be signed off, this request shows it's pending as of today, she needs this completed no later than Friday on 01/24.

## 2023-08-29 ENCOUNTER — Encounter: Payer: 59 | Admitting: Gastroenterology

## 2023-09-01 ENCOUNTER — Ambulatory Visit: Payer: Self-pay | Admitting: Internal Medicine

## 2023-09-01 ENCOUNTER — Ambulatory Visit
Admission: EM | Admit: 2023-09-01 | Discharge: 2023-09-01 | Disposition: A | Discharge status: Home / Self Care | Payer: 59 | Attending: Family Medicine | Admitting: Family Medicine

## 2023-09-01 ENCOUNTER — Other Ambulatory Visit: Payer: Self-pay | Admitting: Internal Medicine

## 2023-09-01 ENCOUNTER — Ambulatory Visit (HOSPITAL_COMMUNITY): Payer: 59

## 2023-09-01 ENCOUNTER — Other Ambulatory Visit (HOSPITAL_COMMUNITY): Payer: 59

## 2023-09-01 DIAGNOSIS — M549 Dorsalgia, unspecified: Secondary | ICD-10-CM

## 2023-09-01 DIAGNOSIS — J441 Chronic obstructive pulmonary disease with (acute) exacerbation: Secondary | ICD-10-CM | POA: Diagnosis not present

## 2023-09-01 MED ORDER — TIZANIDINE HCL 4 MG PO CAPS: 4.0000 mg | ORAL_CAPSULE | Freq: Three times a day (TID) | ORAL | 0 refills | Status: DC | PRN

## 2023-09-01 MED ORDER — AZITHROMYCIN 250 MG PO TABS: ORAL_TABLET | ORAL | 0 refills | Status: DC

## 2023-09-01 NOTE — ED Triage Notes (Signed)
Pt reports she has a bad cough with mucus, weak, and right shoulder blade pain x 1 week      Pt is on 3 liters of nasal canula

## 2023-09-01 NOTE — Telephone Encounter (Signed)
Chief Complaint: pain under right shoulder blade, cough Symptoms: pain under right shoulder blade that is worse with cough. Cough with green sputum Frequency: pain started this morning. Cough has been chronic but green sputum is new Pertinent Negatives: Patient denies fever, CP Disposition: [] ED /[x] Urgent Care (no appt availability in office) / [] Appointment(In office/virtual)/ []  [place] [facility]/ [] Home Care/ [] Refused Recommended Disposition /[] [place] [facility]/ []  Follow-up with PCP Additional Notes: patient with hx of COPD and CHF c/o pain under right shoulder blade along with green sputum from cough. Patient states she hasn't been feeling well and noticed today that under her right should blade is quite painful while coughing. Patient states her regular pain medicine isn't touching this pain. Per protocol, the recommendation is for urgent care. Patient verbalized understanding of plan for urgent care for evaluation and all questions answered.     Copied from CRM [id number]. Topic: Clinical - Red Word Triage >> Sep 01, 2023  3:00 PM Elle L wrote: [facility] that prompted transfer to Nurse Triage: The patient is having a severe pain under shoulder blade. Requested to be triaged by [facility] Management Team. Reason for Disposition  [1] MILD difficulty breathing (e.g., minimal/no SOB at rest, SOB with walking, pulse <100) AND [2] still present when not coughing  [1] MODERATE pain (e.g., interferes with normal activities) AND [2] present > 3 days  Answer Assessment - Initial Assessment Questions 1. ONSET: "When did the pain start?"     Started about 9 am this morning 2. LOCATION: "Where is the pain located?"     Right side under shoulder blade 3. PAIN: "How bad is the pain?" (Scale 1-10; or mild, moderate, severe)   - MILD (1-3): doesn't interfere with normal activities   - MODERATE (4-7): interferes with normal activities (e.g., work or school) or awakens from sleep   -  SEVERE (8-10): excruciating pain, unable to do any normal activities, unable to move arm at all due to pain     8 out of 10 4. WORK OR EXERCISE: "Has there been any recent work or exercise that involved this part of the body?"     No 5. CAUSE: "What do you think is causing the shoulder pain?"     unsure 6. OTHER SYMPTOMS: "Do you have any other symptoms?" (e.g., neck pain, swelling, rash, fever, numbness, weakness)     No  Answer Assessment - Initial Assessment Questions 1. ONSET: "When did the cough begin?"      Patient states cough has been going on for awhile with COPD 2. SEVERITY: "How bad is the cough today?"      Worse than normal 3. SPUTUM: "Describe the color of your sputum" (none, dry cough; clear, white, yellow, green)     Green 4. HEMOPTYSIS: "Are you coughing up any blood?" If so ask: "How much?" (flecks, streaks, tablespoons, etc.)     no 5. DIFFICULTY BREATHING: "Are you having difficulty breathing?" If Yes, ask: "How bad is it?" (e.g., mild, moderate, severe)    - MILD: No SOB at rest, mild SOB with walking, speaks normally in sentences, can lie down, no retractions, pulse < 100.    - MODERATE: SOB at rest, SOB with minimal exertion and prefers to sit, cannot lie down flat, speaks in phrases, mild retractions, audible wheezing, pulse 100-120.    - SEVERE: Very SOB at rest, speaks in single words, struggling to breathe, sitting hunched forward, retractions, pulse > 120  Patient states that when she takes a deep breath, hurts at her shoulder blade 6. FEVER: "Do you have a fever?" If Yes, ask: "What is your temperature, how was it measured, and when did it start?"     no 7. CARDIAC HISTORY: "Do you have any history of heart disease?" (e.g., heart attack, congestive heart failure)      CHF 8. LUNG HISTORY: "Do you have any history of lung disease?"  (e.g., pulmonary embolus, asthma, emphysema)     COPD, asthma 9. PE RISK FACTORS: "Do you have a history of blood clots?"  (or: recent major surgery, recent prolonged travel, bedridden)     No 10. OTHER SYMPTOMS: "Do you have any other symptoms?" (e.g., runny nose, wheezing, chest pain)       no 11. PREGNANCY: "Is there any chance you are pregnant?" "When was your last menstrual period?"       No 12. TRAVEL: "Have you traveled out of the country in the last month?" (e.g., travel history, exposures)       No  Protocols used: Shoulder Pain-A-AH, Cough - Acute Productive-A-AH

## 2023-09-01 NOTE — ED Provider Notes (Signed)
[facility]    CSN: [id number] Arrival date & time: 09/01/23  1609      History   Chief Complaint No chief complaint on file.   HPI Kayla Ryan is a 68 y.o. female.   Patient presenting today with 1 week history of productive cough, weakness, and right shoulder blade pain and stiffness.  States the pain has been mainly in the last 2 days or so and is worse with movement and lifting of the arm.  Denies known injury, radiation of pain, numbness, tingling.  History of emphysema on 3 L of O2 via nasal cannula continuously.  She has been consistent with her nebulizers and inhaler regimen and states when she gets like this her provider typically puts her on azithromycin.    Past Medical History:  Diagnosis Date   Allergy Unknown   Anemia    Anxiety 1883   Arthritis    Asthma    CHF (congestive heart failure) (HCC) 2021   Chronic bronchitis    Crohn disease (HCC)    Emphysema    Emphysema of lung (HCC) 2003   Fibromyalgia    GERD (gastroesophageal reflux disease)    GI bleed    Herpes    History of blood transfusion    Hyperlipidemia    Hypertension 2015   IBS (irritable bowel syndrome)    Kidney failure    Migraines    Muscular dystrophy (HCC)    Neck pain    Oxygen deficiency 2021   PAF (paroxysmal atrial fibrillation) (HCC)    Not anticoagulated with history of severe GI bleeding   Plantar fasciitis    Polymyositis (HCC)    PONV (postoperative nausea and vomiting)    Right thyroid nodule 04/07/2022   Scoliosis    Type 2 diabetes mellitus [facility])     Patient Active Problem List   Diagnosis Date Noted   History of Crohn's disease 05/01/2023   Myopathy 05/01/2023   Atherosclerosis of aorta (HCC) 04/13/2023   Trigeminal neuralgia 04/11/2023   Encounter for general adult medical examination with abnormal findings 12/08/2022   Statin myopathy 12/08/2022   Acute sinusitis 11/04/2022   Physical deconditioning 11/04/2022   Umbilical hernia without  obstruction and without gangrene 11/01/2022   Lipoma of neck 11/01/2022   History of cystocele 11/01/2022   Chronic prescription benzodiazepine use 06/03/2022   DDD (degenerative disc disease), cervical 04/13/2022   Vertigo 04/07/2022   Mass of postauricular area 04/07/2022   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   Pituitary microadenoma (HCC) 11/10/2021   Thyroid nodule 11/10/2021   AVM (arteriovenous malformation) of small bowel, acquired    Chronic respiratory failure with hypoxia (HCC) 01/28/2021   Pulmonary nodules 07/24/2020   (HFpEF) heart failure with preserved ejection fraction (HCC) 07/24/2020   Syncope 06/08/2019   Trigger finger, acquired 11/03/2017   Chronic obstructive pulmonary disease (HCC) 08/23/2017   Chronic pain 08/23/2017   Gastroesophageal reflux disease without esophagitis 05/22/2017   Nausea 04/20/2017   Primary insomnia 02/01/2017   Esophageal dysphagia 10/06/2016   Essential hypertension 07/17/2016   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   History of Bell's palsy 03/03/2015   Nuclear sclerosis of both eyes 03/03/2015   Hemifacial spasm 03/03/2015   Allergic rhinitis 05/18/2014   GAD (generalized anxiety disorder) 05/18/2014   Arthritis 05/18/2014   Asthma 05/18/2014   Chronic idiopathic constipation 05/18/2014   Dermatomyositis (HCC) 05/18/2014   Migraine 05/18/2014   Rectocele 05/18/2014  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   Peripheral neuropathy 05/18/2014   Right-sided Bell's palsy 04/29/2014   Angina pectoris (HCC) 10/30/2013   Hyperlipidemia 10/30/2013   Carotid artery disease (HCC) 10/30/2013    Past Surgical History:  Procedure Laterality Date   ABDOMINAL HYSTERECTOMY     AGILE CAPSULE N/A 06/24/2021   Procedure: AGILE CAPSULE;  Surgeon: Lanelle Bal, DO;  [facility];  Service: Endoscopy;  Laterality: N/A;   AGILE CAPSULE N/A  07/22/2021   Procedure: AGILE CAPSULE;  Surgeon: Corbin Ade, MD;  [facility];  Service: Endoscopy;  Laterality: N/A;  7:30am   bone spur     BREAST SURGERY  1995   CHOLECYSTECTOMY     COLONOSCOPY WITH PROPOFOL N/A 04/30/2021   Procedure: COLONOSCOPY WITH PROPOFOL;  Surgeon: Corbin Ade, MD;  [facility];  Service: Endoscopy;  Laterality: N/A;   ESOPHAGEAL DILATION N/A 04/30/2021   Procedure: ESOPHAGEAL DILATION;  Surgeon: Corbin Ade, MD;  [facility];  Service: Endoscopy;  Laterality: N/A;   ESOPHAGOGASTRODUODENOSCOPY (EGD) WITH PROPOFOL N/A 04/30/2021   Procedure: ESOPHAGOGASTRODUODENOSCOPY (EGD) WITH PROPOFOL;  Surgeon: Corbin Ade, MD;  [facility];  Service: Endoscopy;  Laterality: N/A;   GIVENS CAPSULE STUDY N/A 09/22/2021   Procedure: GIVENS CAPSULE STUDY;  Surgeon: Corbin Ade, MD;  [facility];  Service: Endoscopy;  Laterality: N/A;  7:30am   HERNIA REPAIR     LEFT HEART CATHETERIZATION WITH CORONARY ANGIOGRAM N/A 11/07/2013   Procedure: LEFT HEART CATHETERIZATION WITH CORONARY ANGIOGRAM;  Surgeon: Runell Gess, MD;  Location: [facility] CATH LAB;  Service: Cardiovascular;  Laterality: N/A;   MASTECTOMY PARTIAL / LUMPECTOMY     OOPHORECTOMY     ROTATOR CUFF REPAIR     TOOTH EXTRACTION N/A 04/15/2022   Procedure: DENTAL RESTORATION/EXTRACTIONS;  Surgeon: Ocie Doyne, DMD;  Location[facility];  Service: Oral Surgery;  Laterality: N/A;    OB History   No obstetric history on file.      Home Medications    Prior to Admission medications   Medication Sig Start Date End Date Taking? Authorizing Provider  azithromycin (ZITHROMAX) 250 MG tablet Take first 2 tablets together, then 1 every day until finished. 09/01/23  Yes Particia Nearing, PA-C  tiZANidine (ZANAFLEX) 4 MG capsule Take 1 capsule (4 mg total) by mouth 3 (three) times daily as needed for muscle spasms. Do not drink alcohol or drive while  taking this medication.  May cause drowsiness. 09/01/23  Yes Particia Nearing, PA-C  ACCU-CHEK GUIDE test strip USE TO CHECK BLOOD GLUCOSE THREE TIMES DAILY, MORNING, AT NOON, AND AT BEDTIME 09/26/22   Anabel Halon, MD  Accu-Chek Softclix Lancets lancets USE TO TEST BLOOD SUGAR EVERY MORNING, AT NOON AND EVERY NIGHT AT BEDTIME 09/26/22   Anabel Halon, MD  acyclovir (ZOVIRAX) 400 MG tablet TAKE 1 TABLET(400 MG) BY MOUTH THREE TIMES DAILY 08/07/23   Anabel Halon, MD  albuterol (PROVENTIL) (2.5 MG/3ML) 0.083% nebulizer solution Take 3 mLs (2.5 mg total) by nebulization every 6 (six) hours as needed for wheezing or shortness of breath. 05/30/23 05/29/24  Parrett, Virgel Bouquet, NP  ASPERCREME LIDOCAINE EX Apply topically.    [provider]  aspirin EC 81 MG tablet Take 81 mg by mouth daily. Swallow whole.    [provider]  budesonide (PULMICORT) 0.5 MG/2ML nebulizer solution Take 2 mLs (0.5 mg total) by nebulization in  the morning and at bedtime. 05/30/23   Parrett, Virgel Bouquet, NP  cetirizine (ZYRTEC) 10 MG tablet Take 10 mg by mouth 2 (two) times daily.    [provider]  Continuous Glucose Receiver (DEXCOM G7 RECEIVER) DEVI USE TO CHECK BLOOD GLUCOSE AS NEEDED 12/08/22   Anabel Halon, MD  Continuous Glucose Sensor (DEXCOM G7 SENSOR) MISC USE TO CHECK BLOOD SUGAR THREE TIMES DAILY, BEFORE MEALS AND AT BEDTIME AND AS NEEDED. CHANGE SENSOR EVERY 10 DAYS 03/20/23   Anabel Halon, MD  dicyclomine (BENTYL) 10 MG capsule Take 1 capsule (10 mg total) by mouth 4 (four) times daily -  before meals and at bedtime. Monitor for constipation, dry mouth, dizziness Patient taking differently: Take 10 mg by mouth 4 (four) times daily as needed (Bowel Spasm/Diarrhea). Monitor for constipation, dry mouth, dizziness 01/24/22   Gelene Mink, NP  diphenhydrAMINE (BENADRYL) 25 MG tablet Take 25 mg by mouth every 4 (four) hours as needed for allergies or itching.    [provider]   doxepin (SINEQUAN) 10 MG capsule Take 1 capsule (10 mg total) by mouth at bedtime. 08/28/23   Anabel Halon, MD  DULoxetine (CYMBALTA) 60 MG capsule TAKE 1 CAPSULE(60 MG) BY MOUTH TWICE DAILY 08/28/23   Anabel Halon, MD  ezetimibe (ZETIA) 10 MG tablet Take 1 tablet (10 mg total) by mouth daily. 02/22/23 05/23/23  Jonelle Sidle, MD  gabapentin (NEURONTIN) 800 MG tablet TAKE 1 TABLET(800 MG) BY MOUTH THREE TIMES DAILY 02/16/23   Anabel Halon, MD  Glucagon (GVOKE HYPOPEN 2-PACK) 1 MG/0.2ML SOAJ Inject 0.2 mLs into the skin as needed (Blood glucose < 53). 04/11/23   Anabel Halon, MD  Glycerin-Hypromellose-PEG 400 (DRY EYE RELIEF DROPS OP) Place 1 drop into both eyes 3 (three) times daily as needed (Dry eye).    [provider]  HYDROcodone bit-homatropine (HYCODAN) 5-1.5 MG/5ML syrup Take 5 mLs by mouth every 6 (six) hours as needed for cough. 03/02/23   Leslye Peer, MD  HYDROcodone-acetaminophen (NORCO) 10-325 MG tablet Take 1 tablet by mouth every 4 (four) hours as needed. Stop Hydrocodone 7.5/325mg  tablet. 05/02/22     ipratropium (ATROVENT HFA) 17 MCG/ACT inhaler Inhale 2 puffs into the lungs every 4 (four) hours as needed (COPD). 01/20/23   Leslye Peer, MD  isosorbide dinitrate (ISORDIL) 30 MG tablet TAKE 1 TABLET(30 MG) BY MOUTH DAILY 08/28/23   Anabel Halon, MD  JARDIANCE 10 MG TABS tablet TAKE 1 TABLET(10 MG) BY MOUTH DAILY BEFORE BREAKFAST 08/22/23   Anabel Halon, MD  Lancets Misc. (ACCU-CHEK SOFTCLIX LANCET DEV) KIT USE TO CHECK GLUCOSE THREE TIMES DAILY 09/26/22   Anabel Halon, MD  LORazepam (ATIVAN) 0.5 MG tablet Take 1 tablet (0.5 mg total) by mouth 3 (three) times daily as needed for anxiety. 08/28/23   Anabel Halon, MD  meclizine (ANTIVERT) 25 MG tablet TAKE 1 TABLET(25 MG) BY MOUTH THREE TIMES DAILY AS NEEDED FOR DIZZINESS 07/10/23   Anabel Halon, MD  Misc. Devices KIT Henmnii rollator walker 11/01/22   Anabel Halon, MD  montelukast (SINGULAIR) 10 MG  tablet TAKE 1 TABLET(10 MG) BY MOUTH AT BEDTIME 08/28/23   Byrum, Les Pou, MD  nitroGLYCERIN (NITROSTAT) 0.4 MG SL tablet Place 1 tablet (0.4 mg total) under the tongue every 5 (five) minutes as needed for chest pain. 08/03/23 11/01/23  Jonelle Sidle, MD  omeprazole (PRILOSEC) 40 MG capsule TAKE 1 CAPSULE(40 MG) BY  MOUTH TWICE DAILY 08/28/23   Anabel Halon, MD  ondansetron (ZOFRAN-ODT) 8 MG disintegrating tablet DISSOLVE 1 TABLET(8 MG) ON THE TONGUE EVERY 8 HOURS AS NEEDED FOR NAUSEA OR VOMITING 12/15/22   Doreatha Massed, MD  STIOLTO RESPIMAT 2.5-2.5 MCG/ACT AERS INHALE 2 PUFFS INTO THE LUNGS DAILY 07/26/23   Anabel Halon, MD  tirzepatide [facility]) 5 MG/0.5ML Pen Inject 5 mg into the skin once a week. 07/04/23   Anabel Halon, MD    Family History Family History  Problem Relation Age of Onset   Lung cancer Mother    Cancer Mother    Miscarriages / [place] Mother    Varicose Veins Mother    Hypertension Mother    COPD Father    Lung cancer Father    Lymphoma Father    Alcohol abuse Father    Arthritis Father    Anxiety disorder Sister    Depression Brother    Anxiety disorder Sister    Intellectual disability Sister     Social History Social History   Tobacco Use   Smoking status: Former    Current packs/day: 0.00    Average packs/day: 1 pack/day for 49.0 years (49.0 ttl pk-yrs)    Types: Cigarettes    Start date: 08/08/1966    Quit date: 08/31/2005    Years since quitting: 18.0   Smokeless tobacco: Never  Vaping Use   Vaping status: Never Used  Substance Use Topics   Alcohol use: No   Drug use: No    Comment: used marijuana in teens     Allergies   Sulfa antibiotics, Clindamycin/lincomycin, Dilaudid [hydromorphone hcl], Ferrlecit [na ferric gluc cplx in sucrose], Iron, Metronidazole, Prednisone, Statins, Cephalexin, Methotrexate derivatives, and Morphine and codeine   Review of Systems Review of Systems Per HPI  Physical Exam Triage Vital  Signs ED Triage Vitals  Encounter Vitals Group     BP 09/01/23 1641 138/74     Systolic BP Percentile --      Diastolic BP Percentile --      Pulse Rate 09/01/23 1641 90     Resp 09/01/23 1641 (!) 22     Temp 09/01/23 1641 99 F (37.2 C)     Temp Source 09/01/23 1641 Oral     SpO2 09/01/23 1641 97 %     Weight --      Height --      Head Circumference --      Peak Flow --      Pain Score 09/01/23 1643 8     Pain Loc --      Pain Education --      Exclude from Growth Chart --    No data found.  Updated Vital Signs BP 138/74 (BP Location: Right Arm)   Pulse 90   Temp 99 F (37.2 C) (Oral)   Resp (!) 22   SpO2 97%   Visual Acuity Right Eye Distance:   Left Eye Distance:   Bilateral Distance:    Right Eye Near:   Left Eye Near:    Bilateral Near:     Physical Exam Vitals and nursing note reviewed.  Constitutional:      Appearance: Normal appearance.  HENT:     Head: Atraumatic.     Right Ear: Tympanic membrane and external ear normal.     Left Ear: Tympanic membrane and external ear normal.     Nose: Congestion present.     Mouth/Throat:     Mouth: Mucous  membranes are moist.     Pharynx: Posterior oropharyngeal erythema present.  Eyes:     Extraocular Movements: Extraocular movements intact.     Conjunctiva/sclera: Conjunctivae normal.  Cardiovascular:     Rate and Rhythm: Normal rate and regular rhythm.     Heart sounds: Normal heart sounds.  Pulmonary:     Effort: Pulmonary effort is normal.     Comments: Decreased breath sounds throughout Speaking in full sentences, breathing comfortably on room air.  Currently on 3 L nasal cannula Musculoskeletal:        General: Tenderness present. Normal range of motion.     Cervical back: Normal range of motion and neck supple.     Comments: Area of tenderness to palpation to the right periscapular region.  Range of motion and strength intact of bilateral upper extremities  Skin:    General: Skin is warm and dry.   Neurological:     Mental Status: She is alert and oriented to person, place, and time.     Comments: Bilateral upper extremities neurovascularly intact  Psychiatric:        Mood and Affect: Mood normal.        Thought Content: Thought content normal.      UC Treatments / Results  Labs (all labs ordered are listed, but only abnormal results are displayed) Labs Reviewed - No data to display  EKG   Radiology No results found.  Procedures Procedures (including critical care time)  Medications Ordered in UC Medications - No data to display  Initial Impression / Assessment and Plan / UC Course  I have reviewed the triage vital signs and the nursing notes.  Pertinent labs & imaging results that were available during my care of the patient were reviewed by me and considered in my medical decision making (see chart for details).     Suspect muscular strain causing the upper back pain, treat with Zanaflex, heat, massage, stretches.  Zithromax sent for COPD exacerbation in addition to continued breathing treatments, inhaler regimen and O2 use.  Return for any worsening symptoms.  Final Clinical Impressions(s) / UC Diagnoses   Final diagnoses:  COPD exacerbation (HCC)  Upper back pain on right side   Discharge Instructions   None    ED Prescriptions     Medication Sig Dispense Auth. Provider   azithromycin (ZITHROMAX) 250 MG tablet Take first 2 tablets together, then 1 every day until finished. 6 tablet Particia Nearing, PA-C   tiZANidine (ZANAFLEX) 4 MG capsule Take 1 capsule (4 mg total) by mouth 3 (three) times daily as needed for muscle spasms. Do not drink alcohol or drive while taking this medication.  May cause drowsiness. 15 capsule Particia Nearing, [place]      PDMP not reviewed this encounter.   Particia Nearing, [place] 09/01/23 1736

## 2023-09-04 ENCOUNTER — Ambulatory Visit: Payer: Self-pay | Admitting: *Deleted

## 2023-09-04 NOTE — Patient Instructions (Signed)
Visit Information  Thank you for taking time to visit with me today. Please don't hesitate to contact me if I can be of assistance to you.   before a tooth extraction infections should be treated first to prevent systemic infections, spreading infections to other parts of the body  pH stands for "potential of hydrogen". It's a logarithmic scale that measures the concentration of hydrogen ions in a substance.  pH less than 7 indicates acidity, while a pH greater than 7 indicates basicity Vaginal pH -The vagina's pH level is about 3.8 to 4.5, which means it's on the acidic side. Your vagina naturally is home to different types of healthy, acid-producing bacteria. Your vagina's acidity helps protect it against germs. The vagina produces acidic fluids. Acid kills harmful bacteria, parasites, and fungi.    Following are the goals we discussed today:   Goals Addressed             This Visit's Progress    Patient will be able to have home management of her respiratory symptoms, chronic pain, IBS/Crohn's, COPD,diabetes, finances- RN CM services   On track    Patient will be able to attend all her medical appointments- 09/04/23 attending scheduled visits -(history of Missed 07/07/23 [facility] visit "forgot') Patient will have decreased pulmonary worsening symptoms - 09/04/23 confirmed her antibiotics has cause improvements as evidence by her secretions no longer being green but clearer on 07/10/23 & much better on 09/04/23 Patient will continue to follow the treatment plan of her pain management providers to maintain her pain at pain level of 5 or below as evidence by patient voiced pain levels- 09/04/23 doing better   Interventions Today    Flowsheet Row Most Recent Value  Chronic Disease   Chronic disease during today's visit Other  [Follow up 09/01/23 urgent care visit, infection, PH balance]  General Interventions   General Interventions Discussed/Reviewed General Interventions Reviewed, [employer], Doctor Visits  Doctor Visits Discussed/Reviewed Doctor Visits Reviewed, PCP, Specialist  [discussed GYN visit for symptoms after PMH cervix colonization]  PCP/Specialist Visits Compliance with follow-up visit  Education Interventions   Education Provided Provided Web-based Education, Provided Education  [Sent information - web based on vaginitis, upper respiratory infection, sepsis, bacterial vaginosis, pH/vaginal pH]  Provided Verbal Education On Medication, When to see the doctor, [employer], [employer], Other  [zithromax, u card OTC use,]              Our next appointment is by telephone on 10/05/23 at 2:45 pm  Please call the care guide team at [phone] if you need to cancel or reschedule your appointment.   If you are experiencing a Mental Health or Behavioral Health Crisis or need someone to talk to, please call the Suicide and Crisis Lifeline: 988 call the [place] National Suicide Prevention Lifeline: (667)653-6318 or TTY: (505)126-5161 TTY 2760424797) to talk to a trained counselor call 1-800-273-TALK (toll free, 24 hour hotline) call the [facility]: [phone] call 911   Patient verbalizes understanding of instructions and care plan provided today and agrees to view in MyChart. Active MyChart status and patient understanding of how to access instructions and care plan via MyChart confirmed with patient.     The patient has been provided with contact information for the care management team and has been advised to call with any health related questions or concerns.   SIGNATURE Quavon Keisling L. Noelle Penner, RN, BSN, CCM [place]  [facility], [facility] RN Care  Manager Direct Dial: [phone]  Fax: [phone] Mailing Address: [address]  [place] [address] Website: [email or web address]

## 2023-09-04 NOTE — Patient Outreach (Addendum)
  Care Coordination   Follow Up Visit Note   09/04/2023 Name: Kayla Ryan MRN: [id number] DOB: 1955-12-22  Kayla Ryan is a 68 y.o. year old female who sees Kayla Halon, MD for primary care. I spoke with  Kayla Ryan by phone today.  What matters to the patients health and wellness today?  Urgent care 09/01/23 visit for respiratory worsening symptoms & Shoulder pain between shoulder blades, signs of infection, PH balance  Voiced concern about being well (without respiratory symptoms) by 09/15/23 for procedure to assist with getting new dentures, full set needed  Has poor dentition (over 2 years). She will outreach to RN CM if she notices more infection symptoms this week that may prevent procedure on 09/15/23  Back pain decreased, only notes pain after coughing  Sputum secretions now cloudy white with "maybe" a tinge of green at intervals per patients, no longer completely green Continue inhalers, mucinex every 6 hours, 3 L of O2 [name] sweats resolved, fever/temperature resolved Over a year vaginal odor noted- not seen by GYN in years. Patient will outreach to a local GYN for evaluation plus incorporate vaginal PH home management  suggestions   Goals Addressed             This Visit's Progress    Patient will be able to have home management of her respiratory symptoms, chronic pain, IBS/Crohn's, COPD,diabetes, finances- RN CM services   On track    Patient will be able to attend all her medical appointments- 09/04/23 attending scheduled visits -(history of Missed 07/07/23 AP CT visit "forgot') Patient will have decreased pulmonary worsening symptoms - 09/04/23 confirmed her antibiotics has cause improvements as evidence by her secretions no longer being green but clearer on 07/10/23 & much better on 09/04/23 Patient will continue to follow the treatment plan of her pain management providers to maintain her pain at pain level of 5 or below as evidence by patient voiced pain  levels- 09/04/23 doing better   Interventions Today    Flowsheet Row Most Recent Value  Chronic Disease   Chronic disease during today's visit Other  [Follow up 09/01/23 urgent care visit, infection, PH balance]  General Interventions   General Interventions Discussed/Reviewed General Interventions Reviewed, [facility], Doctor Visits  Doctor Visits Discussed/Reviewed Doctor Visits Reviewed, PCP, Specialist  [discussed GYN visit for symptoms after PMH cervix colonization]  PCP/Specialist Visits Compliance with follow-up visit  Education Interventions   Education Provided Provided Web-based Education, Provided Education  [Sent information - web based on vaginitis, upper respiratory infection, sepsis, bacterial vaginosis, pH/vaginal pH]  Provided Verbal Education On Medication, When to see the doctor, [facility], [employer], [employer], u card OTC use,]              SDOH assessments and interventions completed:  No     Care Coordination Interventions:  Yes, provided   Follow up plan: Follow up call scheduled for 10/05/23 or prior if symptoms of infection returns    Encounter Outcome:  Patient Visit Completed   [name], RN, BSN, CCM [place]  [facility], [facility] RN Care Manager Direct Dial: [phone]  Fax: [phone] Mailing Address: [address]  [place] [address] Website: [email or web address]

## 2023-09-07 DIAGNOSIS — J449 Chronic obstructive pulmonary disease, unspecified: Secondary | ICD-10-CM | POA: Diagnosis not present

## 2023-09-07 DIAGNOSIS — J439 Emphysema, unspecified: Secondary | ICD-10-CM | POA: Diagnosis not present

## 2023-09-10 ENCOUNTER — Other Ambulatory Visit: Payer: Self-pay | Admitting: Internal Medicine

## 2023-09-10 DIAGNOSIS — R42 Dizziness and giddiness: Secondary | ICD-10-CM

## 2023-09-10 DIAGNOSIS — M5030 Other cervical disc degeneration, unspecified cervical region: Secondary | ICD-10-CM

## 2023-09-11 ENCOUNTER — Encounter (HOSPITAL_COMMUNITY): Payer: Self-pay

## 2023-09-11 ENCOUNTER — Ambulatory Visit (HOSPITAL_COMMUNITY): Payer: 59

## 2023-09-11 ENCOUNTER — Other Ambulatory Visit (HOSPITAL_COMMUNITY): Payer: 59

## 2023-09-11 NOTE — H&P (Signed)
  Patient: Kayla Ryan  PID: [id number]  DOB: 06-May-1956  SEX: Female   Patient referred by DDS for evaluation  CC: Dentist says they cant make denture due to bone contour on left mandibular ridge.  Past Medical History:  Scoliosis, Polymyositis, Muscular dystrophy, Migraine, Kidney Failure, IBS, Hyperlipidemia, Fibromyalgia, Emphysema, Crohn's Disease, COPD, CHF, Chest Pain or Angina, Asthma, Pain Management, Obese    Medications: Acyclovir, Bentyl, Doxepin, Neurontin, Lopid, Atrovent, Ativan, Singulair, Nitrostat, Omeprazole, Zofran, Jardiance, Maalox, Mounjaro, Spiriva Inhaler, Hydrocodone    Allergies:     Sulfa, Dilaudid, Iron, Flagyl, Prednisone, Cephalexin, Methotrexate, Morphine, Oxycodone    Surgeries:   Colonoscopy, Heart Catheterization, Hernia, TAH                              Exam: BMI 33. Residual lingual torus left mandible (wide alveolar ridge lower left with undercut lingual).  Teeth # 22, 23, 24, 25, 28 remain in lower jaw.  No purulence, edema, fluctuance, trismus. Oral cancer screening negative. Pharynx clear. No lymphadenopathy.  Panorex: from 2023.  Assessment: ASA 3. Non-restorable teeth #  22, 23, 24, 25, 28. Residual lingual torus left mandible             Plan:1. Pulmonary clearance 2. MD clearance 3. Extraction Teeth # 22, 23, 24, 25, 28. Alveoloplasty right and left mandible. Remove left lingual torus. Hospital Day surgery.                 Rx: n               Risks and complications explained. Questions answered.   Georgia Lopes, DMD

## 2023-09-14 ENCOUNTER — Other Ambulatory Visit: Payer: Self-pay

## 2023-09-14 ENCOUNTER — Encounter (HOSPITAL_COMMUNITY): Payer: Self-pay | Admitting: Oral Surgery

## 2023-09-14 NOTE — Anesthesia Preprocedure Evaluation (Addendum)
 Anesthesia Evaluation  Patient identified by MRN, date of birth, ID band Patient awake    Reviewed: Allergy & Precautions, H&P , NPO status , Patient's Chart, lab work & pertinent test results  History of Anesthesia Complications (+) PONV and history of anesthetic complications  Airway Mallampati: II   Neck ROM: full    Dental   Pulmonary asthma , COPD, former smoker   breath sounds clear to auscultation       Cardiovascular hypertension, + angina  +CHF  + dysrhythmias Atrial Fibrillation  Rhythm:irregular Rate:Normal     Neuro/Psych  Headaches PSYCHIATRIC DISORDERS Anxiety      Neuromuscular disease    GI/Hepatic ,GERD  ,,  Endo/Other  diabetes, Type 2    Renal/GU      Musculoskeletal  (+) Arthritis ,  Fibromyalgia -  Abdominal   Peds  Hematology   Anesthesia Other Findings   Reproductive/Obstetrics                             Anesthesia Physical Anesthesia Plan  ASA: 4  Anesthesia Plan: General   Post-op Pain Management:    Induction: Intravenous  PONV Risk Score and Plan: 4 or greater and Ondansetron , Dexamethasone , Midazolam  and Treatment may vary due to age or medical condition  Airway Management Planned: Nasal ETT  Additional Equipment:   Intra-op Plan:   Post-operative Plan: Extubation in OR  Informed Consent: I have reviewed the patients History and Physical, chart, labs and discussed the procedure including the risks, benefits and alternatives for the proposed anesthesia with the patient or authorized representative who has indicated his/her understanding and acceptance.     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  Last seen in pulmonology follow-up by Madelin Kiang, NP on 06/09/2023 for preop evaluation.  Per note, Preoperative Evaluation for Dental Surgery: She requires surgical clearance for dental surgery, which involves gum shaving and extraction of five teeth. Given her COPD and inflammatory myopathy , there is an increased risk during general anesthesia, although previous anesthesia was well-tolerated. We advised a hospital setting for the procedure due to her high-risk status and discussed the risks of general anesthesia, including the increased risk of pneumonia and the need for early mobilization post-surgery. Recommend a hospital setting for the procedure, and have discussed the risks associated with general anesthesia.  Preop pulmonary/respiratory risk assessment.  From a pulmonary standpoint patient would be a moderate to high risk due to her underlying severe COPD and oxygen  dependence.Patient has underlying severe COPD that is oxygen  dependent.  She appears to be at back to baseline.   Went over pulmonary pre op risk assessment in detail.Major Pulmonary risks identified in the multifactorial risk analysis are but not limited to a) pneumonia; b) recurrent intubation risk; c) prolonged or recurrent acute respiratory failure needing mechanical ventilation; d) prolonged hospitalization; e) DVT/Pulmonary embolism; f) Acute Pulmonary edema. Recommend 1. Short duration of surgery as much as possible and avoid paralytic if possible.  2. Recovery in step down or ICU with Pulmonary consultation if indicated. Aggressive pulmonary toilet  with o2, bronchodilatation, and incentive spirometry and early ambulation.  She has a history of cardiac cath in 2015 demonstrating normal coronaries. Last seen in cardiology  follow-up by Dr. Debera on 02/22/2023.  Stable at that time from cardiac standpoint.  Updated echo and carotid Dopplers were ordered.  Echo 03/30/2023 showed EF 65 to 70%, grade 1 DD, normal RV function, no significant valvular abnormalities.  Carotid Dopplers 03/15/2023 showed bilateral 50 to 69% stenosis.  Regarding reported history of muscular dystrophy, neurology note by Dr. Tobie 05/16/23 states, She has been followed at [facility] for myopathy. 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 At her last visit, she underwent genetic testing, but results were not received. Test results available in care everywhere are normal.   CMP and CBC from 08/14/2023 reviewed, mildly elevated AST and ALT at 52 and 84 respectively, otherwise unremarkable.  A1c 5.5.  EKG 02/22/2023: Normal sinus rhythm.  Rate 84. Rightward axis. Incomplete right bundle branch block  TTE 03/30/2023: 1. Left ventricular ejection fraction, by estimation, is 65 to 70%. The  left ventricle has normal function. The left ventricle has no regional  wall motion abnormalities. Left ventricular diastolic parameters are  consistent with Grade I diastolic  dysfunction (impaired relaxation).  2. Right ventricular systolic function is normal. The right ventricular  size is normal. Tricuspid regurgitation signal is inadequate for assessing  PA pressure.  3. Left atrial size was mildly dilated.  4. The mitral valve is grossly normal. Trivial mitral valve  regurgitation.  5. The aortic valve is tricuspid. There is mild calcification of the  aortic valve. Aortic valve regurgitation is not visualized. Aortic valve  sclerosis is present, with no evidence of aortic valve stenosis. Aortic  valve mean gradient measures 7.0 mmHg.  6. The inferior vena cava is normal in size with greater than 50%   respiratory variability, suggesting right atrial pressure of 3 mmHg.   Comparison(s): Prior images reviewed side by side. LVEF remains vigorous  at 65-70%.   Carotid Dopplers 03/15/2023: IMPRESSION: Heterogeneous and partially calcified plaque at the bilateral carotid bifurcation, with discordant results regarding degree of stenosis by established duplex criteria. Peak velocity suggests 50%-69% bilateral stenosis, with the ICA/ CCA ratio suggesting a lesser degree of stenosis. If establishing a more accurate degree of stenosis is required, cerebral angiogram should be considered, or as a second best test, CTA.   )        Anesthesia Quick Evaluation

## 2023-09-14 NOTE — Progress Notes (Signed)
 PCP - Tobie Suzzane POUR, MD  Cardiologist - Debera Jayson MATSU, MD   PPM/ICD - denies Device Orders - n/a Rep Notified - n/a  Chest x-ray -  Chest CT 07-11-23 EKG - 02-22-23 Stress Test -  ECHO - 03-30-23 Cardiac Cath - 2015  CPAP - denies DOES WEAR 02 at [address]  GLP-1 -tirzepatide  (MOUNJARO ) last dose 09-03-23  Fasting Blood Sugar - per patient around 118 Checks Blood Sugar dexacom right arm A1c 5.5. on 08-14-23  Blood Thinner Instructions: denies Aspirin  Instructions: n/a  ERAS Protcol - NPO .  Anesthesia review: yes  Patient verbally denies any shortness of breath, fever, cough and chest pain during phone call   -------------  SDW INSTRUCTIONS given:  Your procedure is scheduled on September 15, 2023  Report to [facility] Main Entrance A at 6:15 A.M., and check in at the Admitting office.  Call this number if you have problems the morning of surgery:  [phone]   Remember:  Do not eat or drink  after midnight the night before your surgery      Take these medicines the morning of surgery with A SIP OF WATER  DULoxetine  (CYMBALTA )  ezetimibe  (ZETIA )  gabapentin  (NEURONTIN )  isosorbide  dinitrate (  omeprazole  (PRILOSEC)  STIOLTO RESPIMAT    IF NEEDED albuterol  (PROVENTIL ) nebulizer solution  cetirizine  (ZYRTEC )  dicyclomine  (BENTYL )  Glucagon   HYDROcodone -acetaminophen  (NORCO)  ipratropium inhaler  LORazepam  (ATIVAN )  meclizine  (ANTIVERT )  methocarbamol  (ROBAXIN )  nitroGLYCERIN  (NITROSTAT )  ondansetron  (ZOFRAN -ODT)   As of today, STOP taking any Aspirin  (unless otherwise instructed by your surgeon) Aleve, Naproxen, Ibuprofen, Motrin, Advil, Goody's, BC's, all herbal medications, fish oil, and all vitamins. WHAT DO I DO ABOUT MY DIABETES MEDICATION?   Do not take oral diabetes medicines (pills) the morning of surgery.       JARDIANCE    tirzepatide  (MOUNJARO )   The day of surgery, do not take other diabetes injectables, including Byetta  (exenatide), Bydureon (exenatide ER), Victoza (liraglutide), or Trulicity (dulaglutide).  If your CBG is greater than 220 mg/dL, you may take  of your sliding scale (correction) dose of insulin .   HOW TO MANAGE YOUR DIABETES BEFORE AND AFTER SURGERY  Why is it important to control my blood sugar before and after surgery? Improving blood sugar levels before and after surgery helps healing and can limit problems. A way of improving blood sugar control is eating a healthy diet by:  Eating less sugar and carbohydrates  Increasing activity/exercise  Talking with your doctor about reaching your blood sugar goals High blood sugars (greater than 180 mg/dL) can raise your risk of infections and slow your recovery, so you will need to focus on controlling your diabetes during the weeks before surgery. Make sure that the doctor who takes care of your diabetes knows about your planned surgery including the date and location.  How do I manage my blood sugar before surgery? Check your blood sugar at least 4 times a day, starting 2 days before surgery, to make sure that the level is not too high or low.  Check your blood sugar the morning of your surgery when you wake up and every 2 hours until you get to the Short Stay unit.  If your blood sugar is less than 70 mg/dL, you will need to treat for low blood sugar: Do not take insulin . Treat a low blood sugar (less than 70 mg/dL) with  cup of clear juice (cranberry or apple), 4 glucose tablets, OR glucose gel. Recheck  blood sugar in 15 minutes after treatment (to make sure it is greater than 70 mg/dL). If your blood sugar is not greater than 70 mg/dL on recheck, call [phone] for further instructions. Report your blood sugar to the short stay nurse when you get to Short Stay.  If you are admitted to the hospital after surgery: Your blood sugar will be checked by the staff and you will probably be given insulin  after surgery (instead of oral diabetes  medicines) to make sure you have good blood sugar levels. The goal for blood sugar control after surgery is 80-180 mg/dL.                        Do not wear jewelry, make up, or nail polish            Do not wear lotions, powders, perfumes/colognes, or deodorant.            Do not shave 48 hours prior to surgery.  Men may shave face and neck.            Do not bring valuables to the hospital.            [facility] is not responsible for any belongings or valuables.  Do NOT Smoke (Tobacco/Vaping) 24 hours prior to your procedure If you use a CPAP at night, you may bring all equipment for your overnight stay.   Contacts, glasses, dentures or bridgework may not be worn into surgery.      For patients admitted to the hospital, discharge time will be determined by your treatment team.   Patients discharged the day of surgery will not be allowed to drive home, and someone needs to stay with them for 24 hours.    Special instructions:   [place]- Preparing For Surgery  Before surgery, you can play an important role. Because skin is not sterile, your skin needs to be as free of germs as possible. You can reduce the number of germs on your skin by washing with CHG (chlorahexidine gluconate) Soap before surgery.  CHG is an antiseptic cleaner which kills germs and bonds with the skin to continue killing germs even after washing.    Oral Hygiene is also important to reduce your risk of infection.  Remember - BRUSH YOUR TEETH THE MORNING OF SURGERY WITH YOUR REGULAR TOOTHPASTE  Please do not use if you have an allergy to CHG or antibacterial soaps. If your skin becomes reddened/irritated stop using the CHG.  Do not shave (including legs and underarms) for at least 48 hours prior to first CHG shower. It is OK to shave your face.  Please follow these instructions carefully.   Shower the NIGHT BEFORE SURGERY and the MORNING OF SURGERY with DIAL Soap.   Pat yourself dry with a CLEAN  TOWEL.  Wear CLEAN PAJAMAS to bed the night before surgery  Place CLEAN SHEETS on your bed the night of your first shower and DO NOT SLEEP WITH PETS.   Day of Surgery: Please shower morning of surgery  Wear Clean/Comfortable clothing the morning of surgery Do not apply any deodorants/lotions.   Remember to brush your teeth WITH YOUR REGULAR TOOTHPASTE.   Questions were answered. Patient verbalized understanding of instructions.

## 2023-09-14 NOTE — Progress Notes (Addendum)
 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  Last seen in pulmonology follow-up by Madelin Kiang, NP on 06/09/2023 for preop evaluation.  Per note, Preoperative Evaluation for Dental Surgery: She requires surgical clearance for dental surgery, which involves gum shaving and extraction of five teeth. Given her COPD and inflammatory myopathy , there is an increased risk during general anesthesia, although previous anesthesia was well-tolerated. We advised a hospital setting for the procedure due to her high-risk status and discussed the risks of general anesthesia, including the increased risk of pneumonia and the need for early mobilization post-surgery. Recommend a hospital setting for the procedure, and have discussed the risks associated with general anesthesia.   Preop pulmonary/respiratory risk assessment.  From a pulmonary standpoint patient would be a moderate to high risk due to her underlying severe COPD and oxygen  dependence.Patient has underlying severe COPD that is oxygen  dependent.  She appears to be at back to baseline.   Went over pulmonary pre op risk assessment in detail. Major Pulmonary risks identified in the multifactorial risk analysis are but not limited to a) pneumonia; b) recurrent intubation risk; c) prolonged or recurrent acute respiratory failure needing mechanical ventilation; d) prolonged hospitalization; e) DVT/Pulmonary embolism; f) Acute Pulmonary edema. Recommend 1. Short duration of surgery as much as possible and avoid paralytic if possible.  2. Recovery in step down or ICU with Pulmonary consultation if indicated. Aggressive  pulmonary toilet with o2, bronchodilatation, and incentive spirometry and early ambulation.  She has a history of cardiac cath in 2015 demonstrating normal coronaries. Last seen in cardiology follow-up by Dr. Debera on 02/22/2023.  Stable at that time from cardiac standpoint.  Updated echo and carotid Dopplers were ordered.  Echo 03/30/2023 showed EF 65 to 70%, grade 1 DD, normal RV function, no significant valvular abnormalities.  Carotid Dopplers 03/15/2023 showed bilateral 50 to 69% stenosis.  Regarding reported history of muscular dystrophy, neurology note by Dr. Tobie 05/16/23 states, She has been followed at [facility] for myopathy. 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 At her last visit, she underwent genetic testing, but results were not received. Test results available in care everywhere are normal.   CMP and CBC from 08/14/2023 reviewed, mildly elevated AST and ALT at 52 and 84 respectively, otherwise unremarkable.  A1c 5.5.  EKG 02/22/2023: Normal sinus rhythm.  Rate 84. Rightward axis. Incomplete right bundle branch block  TTE 03/30/2023:  1. Left ventricular ejection fraction, by estimation, is 65 to 70%. The  left ventricle has normal function. The left ventricle has no regional  wall motion abnormalities. Left ventricular diastolic parameters are  consistent with Grade I diastolic  dysfunction (impaired relaxation).   2. Right ventricular systolic function is normal. The right ventricular  size is normal. Tricuspid regurgitation signal is inadequate for assessing  PA pressure.   3. Left atrial size was mildly dilated.   4. The mitral valve is grossly normal. Trivial mitral valve  regurgitation.   5. The aortic valve is tricuspid. There is mild calcification of the  aortic valve. Aortic valve regurgitation is  not visualized. Aortic valve   sclerosis is present, with no evidence of aortic valve stenosis. Aortic  valve mean gradient measures 7.0 mmHg.   6. The inferior vena cava is normal in size with greater than 50%  respiratory variability, suggesting right atrial pressure of 3 mmHg.   Comparison(s): Prior images reviewed side by side. LVEF remains vigorous  at 65-70%.   Carotid Dopplers 03/15/2023: IMPRESSION: Heterogeneous and partially calcified plaque at the bilateral carotid bifurcation, with discordant results regarding degree of stenosis by established duplex criteria. Peak velocity suggests 50%-69% bilateral stenosis, with the ICA/ CCA ratio suggesting a lesser degree of stenosis. If establishing a more accurate degree of stenosis is required, cerebral angiogram should be considered, or as a second best test, CTA.   Lynwood Geofm RIGGERS [facility] Short Stay Center/Anesthesiology Phone [phone] 09/14/2023 9:48 AM

## 2023-09-15 ENCOUNTER — Other Ambulatory Visit: Payer: Self-pay

## 2023-09-15 ENCOUNTER — Ambulatory Visit (HOSPITAL_BASED_OUTPATIENT_CLINIC_OR_DEPARTMENT_OTHER): Payer: 59 | Admitting: Physician Assistant

## 2023-09-15 ENCOUNTER — Encounter (HOSPITAL_COMMUNITY)
Admission: RE | Disposition: A | Discharge status: Home / Self Care | Payer: Self-pay | Source: Home / Self Care | Attending: Oral Surgery

## 2023-09-15 ENCOUNTER — Ambulatory Visit (HOSPITAL_COMMUNITY): Payer: 59 | Admitting: Physician Assistant

## 2023-09-15 ENCOUNTER — Encounter (HOSPITAL_COMMUNITY): Payer: Self-pay | Admitting: Oral Surgery

## 2023-09-15 ENCOUNTER — Ambulatory Visit (HOSPITAL_COMMUNITY)
Admission: RE | Admit: 2023-09-15 | Discharge: 2023-09-15 | Disposition: A | Discharge status: Home / Self Care | Payer: 59 | Attending: Oral Surgery | Admitting: Oral Surgery

## 2023-09-15 DIAGNOSIS — I5030 Unspecified diastolic (congestive) heart failure: Secondary | ICD-10-CM | POA: Diagnosis not present

## 2023-09-15 DIAGNOSIS — I4891 Unspecified atrial fibrillation: Secondary | ICD-10-CM | POA: Diagnosis not present

## 2023-09-15 DIAGNOSIS — E119 Type 2 diabetes mellitus without complications: Secondary | ICD-10-CM | POA: Diagnosis not present

## 2023-09-15 DIAGNOSIS — I110 Hypertensive heart disease with heart failure: Secondary | ICD-10-CM | POA: Diagnosis not present

## 2023-09-15 DIAGNOSIS — K0850 Unsatisfactory restoration of tooth, unspecified: Secondary | ICD-10-CM

## 2023-09-15 DIAGNOSIS — K029 Dental caries, unspecified: Secondary | ICD-10-CM | POA: Insufficient documentation

## 2023-09-15 DIAGNOSIS — J4489 Other specified chronic obstructive pulmonary disease: Secondary | ICD-10-CM | POA: Diagnosis not present

## 2023-09-15 DIAGNOSIS — M270 Developmental disorders of jaws: Secondary | ICD-10-CM | POA: Diagnosis not present

## 2023-09-15 DIAGNOSIS — Z87891 Personal history of nicotine dependence: Secondary | ICD-10-CM | POA: Insufficient documentation

## 2023-09-15 DIAGNOSIS — I509 Heart failure, unspecified: Secondary | ICD-10-CM | POA: Diagnosis not present

## 2023-09-15 DIAGNOSIS — M797 Fibromyalgia: Secondary | ICD-10-CM | POA: Insufficient documentation

## 2023-09-15 HISTORY — PX: TOOTH EXTRACTION: SHX859

## 2023-09-15 LAB — CBC
HCT: 40.9 % (ref 36.0–46.0)
Hemoglobin: 13 g/dL (ref 12.0–15.0)
MCH: 29 pg (ref 26.0–34.0)
MCHC: 31.8 g/dL (ref 30.0–36.0)
MCV: 91.1 fL (ref 80.0–100.0)
Platelets: 371 10*3/uL (ref 150–400)
RBC: 4.49 MIL/uL (ref 3.87–5.11)
RDW: 13.2 % (ref 11.5–15.5)
WBC: 6.3 10*3/uL (ref 4.0–10.5)
nRBC: 0 % (ref 0.0–0.2)

## 2023-09-15 LAB — BASIC METABOLIC PANEL
Anion gap: 12 (ref 5–15)
BUN: 27 mg/dL — ABNORMAL HIGH (ref 8–23)
CO2: 32 mmol/L (ref 22–32)
Calcium: 9.9 mg/dL (ref 8.9–10.3)
Chloride: 98 mmol/L (ref 98–111)
Creatinine, Ser: 1 mg/dL (ref 0.44–1.00)
GFR, Estimated: >60 mL/min (ref >60)
Glucose, Bld: 91 mg/dL (ref 70–99)
Potassium: 4.3 mmol/L (ref 3.5–5.1)
Sodium: 142 mmol/L (ref 135–145)

## 2023-09-15 LAB — GLUCOSE, CAPILLARY
Glucose-Capillary: 108 mg/dL — ABNORMAL HIGH (ref 70–99)
Glucose-Capillary: 103 mg/dL — ABNORMAL HIGH (ref 70–99)
Glucose-Capillary: 152 mg/dL — ABNORMAL HIGH (ref 70–99)

## 2023-09-15 SURGERY — DENTAL RESTORATION/EXTRACTIONS
Anesthesia: General | Site: Mouth

## 2023-09-15 MED ORDER — FENTANYL CITRATE (PF) 100 MCG/2ML IJ SOLN
25.0000 ug | INTRAMUSCULAR | Status: DC | PRN
  Administered 2023-09-15 (×2): 50 ug via INTRAVENOUS

## 2023-09-15 MED ORDER — ONDANSETRON HCL 4 MG/2ML IJ SOLN
INTRAMUSCULAR | Status: AC
  Filled 2023-09-15: qty 2

## 2023-09-15 MED ORDER — LIDOCAINE 2% (20 MG/ML) 5 ML SYRINGE
INTRAMUSCULAR | Status: AC
  Filled 2023-09-15: qty 5

## 2023-09-15 MED ORDER — OXYMETAZOLINE HCL 0.05 % NA SOLN
NASAL | Status: DC | PRN
  Administered 2023-09-15: 1 via TOPICAL

## 2023-09-15 MED ORDER — ROCURONIUM BROMIDE 10 MG/ML (PF) SYRINGE
PREFILLED_SYRINGE | INTRAVENOUS | Status: DC | PRN
  Administered 2023-09-15: 50 mg via INTRAVENOUS

## 2023-09-15 MED ORDER — ROCURONIUM BROMIDE 10 MG/ML (PF) SYRINGE
PREFILLED_SYRINGE | INTRAVENOUS | Status: AC
  Filled 2023-09-15: qty 10

## 2023-09-15 MED ORDER — INSULIN ASPART 100 UNIT/ML IJ SOLN: 0.0000 [IU] | INTRAMUSCULAR | Status: DC | PRN

## 2023-09-15 MED ORDER — HYDROCODONE-ACETAMINOPHEN 5-325 MG PO TABS: 1.0000 | ORAL_TABLET | ORAL | 0 refills | Status: DC | PRN

## 2023-09-15 MED ORDER — LACTATED RINGERS IV SOLN: INTRAVENOUS | Status: DC

## 2023-09-15 MED ORDER — OXYCODONE HCL 5 MG PO TABS: 5.0000 mg | ORAL_TABLET | Freq: Once | ORAL | Status: DC | PRN

## 2023-09-15 MED ORDER — PROPOFOL 10 MG/ML IV BOLUS
INTRAVENOUS | Status: DC | PRN
  Administered 2023-09-15: 80 mg via INTRAVENOUS

## 2023-09-15 MED ORDER — 0.9 % SODIUM CHLORIDE (POUR BTL) OPTIME
TOPICAL | Status: DC | PRN
  Administered 2023-09-15: 1000 mL

## 2023-09-15 MED ORDER — ONDANSETRON HCL 4 MG/2ML IJ SOLN: 4.0000 mg | Freq: Four times a day (QID) | INTRAMUSCULAR | Status: DC | PRN

## 2023-09-15 MED ORDER — PROPOFOL 10 MG/ML IV BOLUS
INTRAVENOUS | Status: AC
  Filled 2023-09-15: qty 20

## 2023-09-15 MED ORDER — CHLORHEXIDINE GLUCONATE 0.12 % MT SOLN
15.0000 mL | Freq: Once | OROMUCOSAL | Status: AC
  Administered 2023-09-15: 15 mL via OROMUCOSAL
  Filled 2023-09-15: qty 15

## 2023-09-15 MED ORDER — FENTANYL CITRATE (PF) 250 MCG/5ML IJ SOLN
INTRAMUSCULAR | Status: DC | PRN
  Administered 2023-09-15: 100 ug via INTRAVENOUS

## 2023-09-15 MED ORDER — HYDROCODONE-ACETAMINOPHEN 10-325 MG PO TABS
1.0000 | ORAL_TABLET | Freq: Once | ORAL | Status: AC
  Administered 2023-09-15: 1 via ORAL
  Filled 2023-09-15 (×2): qty 1

## 2023-09-15 MED ORDER — SODIUM CHLORIDE 0.9 % IR SOLN
Status: DC | PRN
  Administered 2023-09-15: 250 mL

## 2023-09-15 MED ORDER — MIDAZOLAM HCL 2 MG/2ML IJ SOLN
INTRAMUSCULAR | Status: DC | PRN
  Administered 2023-09-15 (×2): 1 mg via INTRAVENOUS

## 2023-09-15 MED ORDER — VANCOMYCIN HCL IN DEXTROSE 1-5 GM/200ML-% IV SOLN
1000.0000 mg | INTRAVENOUS | Status: AC
  Administered 2023-09-15: 1000 mg via INTRAVENOUS
  Filled 2023-09-15: qty 200

## 2023-09-15 MED ORDER — ONDANSETRON HCL 4 MG/2ML IJ SOLN
INTRAMUSCULAR | Status: DC | PRN
  Administered 2023-09-15: 4 mg via INTRAVENOUS

## 2023-09-15 MED ORDER — LIDOCAINE 2% (20 MG/ML) 5 ML SYRINGE
INTRAMUSCULAR | Status: DC | PRN
  Administered 2023-09-15: 60 mg via INTRAVENOUS

## 2023-09-15 MED ORDER — SUGAMMADEX SODIUM 200 MG/2ML IV SOLN
INTRAVENOUS | Status: DC | PRN
  Administered 2023-09-15: 300 mg via INTRAVENOUS

## 2023-09-15 MED ORDER — FENTANYL CITRATE (PF) 250 MCG/5ML IJ SOLN
INTRAMUSCULAR | Status: AC
  Filled 2023-09-15: qty 5

## 2023-09-15 MED ORDER — OXYCODONE HCL 5 MG/5ML PO SOLN: 5.0000 mg | Freq: Once | ORAL | Status: DC | PRN

## 2023-09-15 MED ORDER — ORAL CARE MOUTH RINSE: 15.0000 mL | Freq: Once | OROMUCOSAL | Status: AC

## 2023-09-15 MED ORDER — FENTANYL CITRATE (PF) 100 MCG/2ML IJ SOLN
INTRAMUSCULAR | Status: AC
  Filled 2023-09-15: qty 2

## 2023-09-15 MED ORDER — DEXAMETHASONE SODIUM PHOSPHATE 10 MG/ML IJ SOLN
INTRAMUSCULAR | Status: AC
  Filled 2023-09-15: qty 1

## 2023-09-15 MED ORDER — OXYMETAZOLINE HCL 0.05 % NA SOLN
NASAL | Status: DC | PRN
  Administered 2023-09-15 (×2): 2 via NASAL

## 2023-09-15 MED ORDER — LIDOCAINE-EPINEPHRINE 2 %-1:100000 IJ SOLN
INTRAMUSCULAR | Status: DC | PRN
  Administered 2023-09-15: 20 mL via INTRADERMAL

## 2023-09-15 MED ORDER — MIDAZOLAM HCL 2 MG/2ML IJ SOLN
INTRAMUSCULAR | Status: AC
  Filled 2023-09-15: qty 2

## 2023-09-15 SURGICAL SUPPLY — 26 items
BAG COUNTER SPONGE SURGICOUNT (BAG) IMPLANT
BLADE SURG 15 STRL LF DISP TIS (BLADE) ×1 IMPLANT
BUR CROSS CUT FISSURE 1.6 (BURR) ×1 IMPLANT
BUR EGG ELITE 4.0 (BURR) ×1 IMPLANT
CANISTER SUCT 3000ML PPV (MISCELLANEOUS) ×1 IMPLANT
COVER SURGICAL LIGHT HANDLE (MISCELLANEOUS) ×1 IMPLANT
GAUZE PACKING FOLDED 2 STR (GAUZE/BANDAGES/DRESSINGS) ×1 IMPLANT
GLOVE BIO SURGEON STRL SZ8 (GLOVE) ×1 IMPLANT
GOWN STRL REUS W/ TWL LRG LVL3 (GOWN DISPOSABLE) ×1 IMPLANT
GOWN STRL REUS W/ TWL XL LVL3 (GOWN DISPOSABLE) ×1 IMPLANT
IV NS 1000ML BAXH (IV SOLUTION) ×1 IMPLANT
KIT BASIN OR (CUSTOM PROCEDURE TRAY) ×1 IMPLANT
KIT TURNOVER KIT B (KITS) ×1 IMPLANT
NDL HYPO 25GX1X1/2 BEV (NEEDLE) ×2 IMPLANT
NEEDLE HYPO 25GX1X1/2 BEV (NEEDLE) ×2 IMPLANT
NS IRRIG 1000ML POUR BTL (IV SOLUTION) ×1 IMPLANT
PAD ARMBOARD 7.5X6 YLW CONV (MISCELLANEOUS) ×1 IMPLANT
SLEEVE IRRIGATION ELITE 7 (MISCELLANEOUS) ×1 IMPLANT
SPIKE FLUID TRANSFER (MISCELLANEOUS) ×1 IMPLANT
SPONGE SURGIFOAM ABS GEL 12-7 (HEMOSTASIS) IMPLANT
SUT PLAIN 3 0 PS2 27 (SUTURE) ×1 IMPLANT
SYR BULB IRRIG 60ML STRL (SYRINGE) ×1 IMPLANT
SYR CONTROL 10ML LL (SYRINGE) ×1 IMPLANT
TRAY ENT MC OR (CUSTOM PROCEDURE TRAY) ×1 IMPLANT
TUBING IRRIGATION (MISCELLANEOUS) ×1 IMPLANT
YANKAUER SUCT BULB TIP NO VENT (SUCTIONS) ×1 IMPLANT

## 2023-09-15 NOTE — Op Note (Signed)
 NAME: Kayla Ryan, Kayla Ryan. MEDICAL RECORD NO: [id number] ACCOUNT NO: [id number] DATE OF BIRTH: May 26, 1956 FACILITY: [facility] LOCATION: [facility] PHYSICIAN: Glendia EMERSON Primrose, DDS  Operative Report   DATE OF PROCEDURE: 09/15/2023  PREOPERATIVE DIAGNOSES: 1. Nonrestorable teeth 22, 23, 24, 25, and 28 due to dental caries. 2.  Left mandibular lingual torus.  POSTOPERATIVE DIAGNOSES: 1. Nonrestorable teeth 22, 23, 24, 25, and 28 due to dental caries. 2.  Left mandibular lingual torus.  PROCEDURE:  Extraction of teeth numbers 22, 23, 24, 25, and 28, removal of left mandibular lingual torus.  SURGEON:  Glendia EMERSON Primrose, DDS  ANESTHESIA:  General, nasal intubation. Dr. Maryclare, attending.  DESCRIPTION OF PROCEDURE:  The patient was taken to the operating room and placed on the table in the supine position.  General anesthesia was administered.  A nasal endotracheal tube was placed and secured.  The eyes were protected and the patient was  draped for surgery.  A timeout was performed.  The posterior pharynx was suctioned and a throat pack was placed.  2% lidocaine  1:100,000 epinephrine  was infiltrated in an inferior alveolar block on the right and left side and a buccal infiltration in the  anterior mandible.  A bite-block was placed in the right side of the mouth.  The left side was operated first.  A 15 blade was used to make an incision on the edentulous mandible in the area of the edentulous space of tooth #17 and carried forward  anteriorly on the edentulous mandible until tooth #22 was encountered.  Then, the incision was created buccally and lingually in the gingival sulcus until tooth #25 was encountered.  The periosteum was reflected.  The teeth were elevated with a 301  elevator and removed from the mouth with the dental forceps.  The sockets were curetted.  The tissue was trimmed.  The periosteum was reflected to expose the lingual torus and the alveolus.  The egg burr and the Stryker handpiece  under irrigation was  used to reduce the wide contour of the mandible from the canine area to the molar area.  When this was complete, the bone file was used to further smooth the area.  Then the egg burr was used to smooth the bone in the area of teeth numbers 22, 23, 24,  and 25.  Then, the area was irrigated and closed with 3-0 chromic.  Then, the bite block and sweetheart retractor were repositioned to the other side of the mouth and tooth #28 was retracted using a 15 blade to make a sulcular incision.  The periosteum  was reflected with a periosteal elevator and then tooth #28 was removed with a dental forceps.  The socket was curetted, irrigated, and closed with 3-0 chromic.  The oral cavity was then irrigated and suctioned.  The throat pack was removed.  The patient  was left in care of anesthesia for transport to recovery with plans for discharge during through day surgery.  ESTIMATED BLOOD LOSS:  Minimum.  COMPLICATIONS:  None.  SPECIMENS:  None.  COUNTS:  Correct.   PUS D: 09/15/2023 10:01:08 am T: 09/15/2023 10:23:00 am  JOB: [id number]/ [id number]

## 2023-09-15 NOTE — H&P (Signed)
 H&P documentation  -History and Physical Reviewed  -Patient has been re-examined  -No change in the plan of care  Kayla Ryan

## 2023-09-15 NOTE — Progress Notes (Signed)
 Dr. Joyce Nixon made aware that the patient took Nitroglycerin  within the past week for an episode of chest pain. Per the patient the pain was relieved after one tablet. No new orders received at this time.

## 2023-09-15 NOTE — Op Note (Signed)
 09/15/2023  9:55 AM  PATIENT:  Darice FORBES Ona  68 y.o. female  PRE-OPERATIVE DIAGNOSIS:  NON-RESTORABLE TEETH # 22, 23, 24, 25, 28, DUE TO DENTAL CARIES. LEFT MANDIBULAR LINGUAL TORUS  POST-OPERATIVE DIAGNOSIS:  SAME  PROCEDURE:  Procedure(s): EXTRACTIONS TEETH # 22, 23, 24, 25, 28, REMOVAL LEFT MANDIBULAR LINGUAL TORUS  SURGEON:  Surgeon(s): Sheryle Hamilton, DMD  ANESTHESIA:   local and general  EBL:  minimal  DRAINS: none   SPECIMEN:  No Specimen  COUNTS:  YES  PLAN OF CARE: Discharge to home after PACU  PATIENT DISPOSITION:  PACU - hemodynamically stable.   PROCEDURE DETAILS: Dictation #[id number]  Hamilton EMERSON Sheryle, DMD 09/15/2023 9:55 AM

## 2023-09-15 NOTE — Transfer of Care (Signed)
 Immediate Anesthesia Transfer of Care Note  Patient: Kayla Ryan  Procedure(s) Performed: DENTAL RESTORATION/EXTRACTIONS (Mouth)  Patient Location: PACU  Anesthesia Type:General  Level of Consciousness: awake, alert , and oriented  Airway & Oxygen  Therapy: Patient Spontanous Breathing and Patient connected to nasal cannula oxygen   Post-op Assessment: Report given to RN, Post -op Vital signs reviewed and stable, and Patient moving all extremities X 4  Post vital signs: Reviewed and stable  Last Vitals:  Vitals Value Taken Time  BP 155/68 09/15/23 1011  Temp    Pulse 100 09/15/23 1014  Resp 16 09/15/23 1014  SpO2 92 % 09/15/23 1014  Vitals shown include unfiled device data.  Last Pain:  Vitals:   09/15/23 0644  TempSrc:   PainSc: 7       Patients Stated Pain Goal: 2 (09/15/23 9355)  Complications: No notable events documented.

## 2023-09-15 NOTE — Anesthesia Procedure Notes (Signed)
 Procedure Name: Intubation Date/Time: 09/15/2023 9:27 AM  Performed by: Jolynn Mage, CRNAPre-anesthesia Checklist: Patient identified, Emergency Drugs available, Suction available and Patient being monitored Patient Re-evaluated:Patient Re-evaluated prior to induction Oxygen  Delivery Method: Circle system utilized Preoxygenation: Pre-oxygenation with 100% oxygen  Induction Type: IV induction Ventilation: Mask ventilation without difficulty Laryngoscope Size: Mac, 3 and Glidescope Grade View: Grade I Tube type: Oral Nasal Tubes: Nasal prep performed, Nasal Rae and Right Tube size: 7.0 mm Number of attempts: 1 Airway Equipment and Method: Video-laryngoscopy Placement Confirmation: ETT inserted through vocal cords under direct vision, positive ETCO2 and breath sounds checked- equal and bilateral Tube secured with: Tape Dental Injury: Teeth and Oropharynx as per pre-operative assessment

## 2023-09-16 NOTE — Anesthesia Postprocedure Evaluation (Signed)
 Anesthesia Post Note  Patient: Kayla Ryan  Procedure(s) Performed: DENTAL RESTORATION/EXTRACTIONS (Mouth)     Patient location during evaluation: PACU Anesthesia Type: General Level of consciousness: awake and alert Pain management: pain level controlled Vital Signs Assessment: post-procedure vital signs reviewed and stable Respiratory status: spontaneous breathing, nonlabored ventilation, respiratory function stable and patient connected to nasal cannula oxygen  Cardiovascular status: blood pressure returned to baseline and stable Postop Assessment: no apparent nausea or vomiting Anesthetic complications: no   No notable events documented.  Last Vitals:  Vitals:   09/15/23 1030 09/15/23 1045  BP: (!) 140/89 (!) 149/61  Pulse: 95 93  Resp: 20 16  Temp:  36.7 C  SpO2: 95% 96%    Last Pain:  Vitals:   09/15/23 1012  TempSrc:   PainSc: 10-Worst pain ever                 Florinda Taflinger S

## 2023-09-17 ENCOUNTER — Encounter (HOSPITAL_COMMUNITY): Payer: Self-pay | Admitting: Oral Surgery

## 2023-09-19 ENCOUNTER — Inpatient Hospital Stay: Payer: 59

## 2023-09-21 DIAGNOSIS — M47816 Spondylosis without myelopathy or radiculopathy, lumbar region: Secondary | ICD-10-CM | POA: Diagnosis not present

## 2023-09-21 DIAGNOSIS — Z79891 Long term (current) use of opiate analgesic: Secondary | ICD-10-CM | POA: Diagnosis not present

## 2023-09-21 DIAGNOSIS — M797 Fibromyalgia: Secondary | ICD-10-CM | POA: Diagnosis not present

## 2023-09-21 DIAGNOSIS — G894 Chronic pain syndrome: Secondary | ICD-10-CM | POA: Diagnosis not present

## 2023-09-22 ENCOUNTER — Telehealth: Payer: Self-pay | Admitting: Internal Medicine

## 2023-09-22 NOTE — Telephone Encounter (Signed)
Copied from CRM [id number]. Topic: Clinical - Prescription Issue >> Sep 22, 2023  1:52 PM Eunice Blase wrote: Reason for CRM: Received call from [facility] pharmacy per [facility] ph.    Fax:   Discontinue medication diphenhydrAMINE (BENADRYL) 25 MG tablet , meclizine (ANTIVERT) 25 MG tablet and LORazepam (ATIVAN) 0.5 MG tablet. Please call [phone] or fax: [phone].

## 2023-09-23 DIAGNOSIS — J449 Chronic obstructive pulmonary disease, unspecified: Secondary | ICD-10-CM | POA: Diagnosis not present

## 2023-09-23 DIAGNOSIS — J9611 Chronic respiratory failure with hypoxia: Secondary | ICD-10-CM | POA: Diagnosis not present

## 2023-09-25 NOTE — Telephone Encounter (Signed)
A fax will be sent for signature from MD.

## 2023-09-26 ENCOUNTER — Inpatient Hospital Stay: Payer: 59 | Admitting: Physician Assistant

## 2023-09-26 ENCOUNTER — Other Ambulatory Visit: Payer: Self-pay | Admitting: Internal Medicine

## 2023-09-26 DIAGNOSIS — E1149 Type 2 diabetes mellitus with other diabetic neurological complication: Secondary | ICD-10-CM

## 2023-10-02 ENCOUNTER — Other Ambulatory Visit: Payer: Self-pay | Admitting: Internal Medicine

## 2023-10-03 ENCOUNTER — Ambulatory Visit: Payer: Self-pay | Admitting: Internal Medicine

## 2023-10-03 NOTE — Telephone Encounter (Signed)
 Chief Complaint: Fever, earache Symptoms: Fever, earache, chills Frequency: since this morning Pertinent Negatives: Patient denies n/a Disposition: [] ED /[] Urgent Care (no appt availability in office) / [x] Appointment(In office/virtual)/ []  [facility]/ [] Home Care/ [x] Refused Recommended Disposition /[] [place]/ []  Follow-up with PCP Additional Notes: Patient called in stating she is running a 101.7 fever today and has 8/10 pain in her left ear. Patient states she is also intermittently experiencing dizziness but is able to walk around with the help of her cane. Patient has been taking prescribed pain medication to help with ear pain and fever but it is not helping. Recommended disposition to be seen today by a HCP but patient is unable to drive and states she will need to come to an appt tomorrow with the assistance of a friend who will drive her. This RN feels the patient is appropriate to wait until tomorrow, despite recommended disposition. However; this RN did encourage patient to attempt to find a ride to urgent care today to be evaluated for ear pain and fever, and if she is successful to call back and cancel appt for tomorrow. Patient states she would like to keep appt for tomorrow and will call back if she can get evaluated today. Patient appeared mildly confused on which office she was calling (stating she thought she was calling her dentist but reoriented quickly to confirm she was calling PCP). Provided patient with address to office tomorrow for acute evaluation, patient repeated back address to this RN and time of appt for tomorrow.    Copied from CRM [id number]. Topic: Clinical - Red Word Triage >> Oct 03, 2023  2:22 PM Geroge Baseman wrote: Red Word that prompted transfer to Nurse Triage: Patient has shaking all over her body she cannot control, her left ear is giving her extreme pain, fever 101.7 currently. Reason for Disposition  [1] SEVERE pain AND [2] not improved  2 hours after taking analgesic medication (e.g., ibuprofen or acetaminophen)  Answer Assessment - Initial Assessment Questions 1. LOCATION: "Which ear is involved?"     Left ear 2. ONSET: "When did the ear start hurting"      Today  3. SEVERITY: "How bad is the pain?"  (Scale 1-10; mild, moderate or severe)   - MILD (1-3): doesn't interfere with normal activities    - MODERATE (4-7): interferes with normal activities or awakens from sleep    - SEVERE (8-10): excruciating pain, unable to do any normal activities      8 4. URI SYMPTOMS: "Do you have a runny nose or cough?"     Congestion  5. FEVER: "Do you have a fever?" If Yes, ask: "What is your temperature, how was it measured, and when did it start?"     101.7 6. CAUSE: "Have you been swimming recently?", "How often do you use Q-TIPS?", "Have you had any recent air travel or scuba diving?"     No 7. OTHER SYMPTOMS: "Do you have any other symptoms?" (e.g., headache, stiff neck, dizziness, vomiting, runny nose, decreased hearing)     Fever, back pain, vomiting  Protocols used: Earache-A-AH

## 2023-10-03 NOTE — Telephone Encounter (Signed)
 Patient called, left VM to return the call to the office to speak to the NT.    Copied From CRM [id number]. Reason for Triage: Patient reports that she has not been feeling well since 09/15/23. Patient is experiencing diarrhea, earache and shaking uncontrollably. Patient requests call back at [phone] to discuss.

## 2023-10-04 ENCOUNTER — Ambulatory Visit: Payer: 59 | Admitting: Family Medicine

## 2023-10-04 ENCOUNTER — Ambulatory Visit: Payer: Self-pay | Admitting: Internal Medicine

## 2023-10-04 ENCOUNTER — Encounter: Payer: Self-pay | Admitting: Nurse Practitioner

## 2023-10-04 ENCOUNTER — Telehealth: Payer: 59 | Admitting: Nurse Practitioner

## 2023-10-04 VITALS — HR 56

## 2023-10-04 DIAGNOSIS — J441 Chronic obstructive pulmonary disease with (acute) exacerbation: Secondary | ICD-10-CM | POA: Insufficient documentation

## 2023-10-04 MED ORDER — FLUCONAZOLE 150 MG PO TABS: 150.0000 mg | ORAL_TABLET | Freq: Once | ORAL | 0 refills | Status: AC

## 2023-10-04 MED ORDER — DOXYCYCLINE HYCLATE 100 MG PO TABS: 100.0000 mg | ORAL_TABLET | Freq: Two times a day (BID) | ORAL | 0 refills | Status: DC

## 2023-10-04 NOTE — Telephone Encounter (Signed)
  Chief Complaint: cough Symptoms: cough, ear ache, back ache,fever,diarrhea, vomiting Frequency: 1 week  Disposition: [] ED /[] Urgent Care (no appt availability in office) / [x] Appointment(In office/virtual)/ []  [facility]/ [] Home Care/ [] Refused Recommended Disposition /[] [facility]/ []  Follow-up with PCP Additional Notes: Patient states she feels so bad she can't go to appointment in person- advised MyChart visit for evaluation. Patient has multiple symptoms- flu like in nature- but have been present for almost 1 week.     Copied from CRM [id number]. Topic: Appointments - Appointment Scheduling >> Oct 04, 2023  1:13 PM Priscille Loveless wrote: Pt is calling in with wheezing, congestion, coughing, ear hurting. She is asking if the dr would call her in some medication. Reason for Disposition  Earache  Answer Assessment - Initial Assessment Questions 1. ONSET: "When did the cough begin?"      Symptoms started last week- stuffy nose- just finished antibiotic 2 weeks prior 2. SEVERITY: "How bad is the cough today?"      Patient states she has had cough for long time- COPD 3. SPUTUM: "Describe the color of your sputum" (none, dry cough; clear, white, yellow, green)     green 4. HEMOPTYSIS: "Are you coughing up any blood?" If so ask: "How much?" (flecks, streaks, tablespoons, etc.)     no 5. DIFFICULTY BREATHING: "Are you having difficulty breathing?" If Yes, ask: "How bad is it?" (e.g., mild, moderate, severe)    - MILD: No SOB at rest, mild SOB with walking, speaks normally in sentences, can lie down, no retractions, pulse < 100.    - MODERATE: SOB at rest, SOB with minimal exertion and prefers to sit, cannot lie down flat, speaks in phrases, mild retractions, audible wheezing, pulse 100-120.    - SEVERE: Very SOB at rest, speaks in single words, struggling to breathe, sitting hunched forward, retractions, pulse > 120      Last night - yes- did breathing treatment which helped 6.  FEVER: "Do you have a fever?" If Yes, ask: "What is your temperature, how was it measured, and when did it start?"     Last night- 104.2, today 99.2 7. CARDIAC HISTORY: "Do you have any history of heart disease?" (e.g., heart attack, congestive heart failure)      CHF 8. LUNG HISTORY: "Do you have any history of lung disease?"  (e.g., pulmonary embolus, asthma, emphysema)     COPD  10. OTHER SYMPTOMS: "Do you have any other symptoms?" (e.g., runny nose, wheezing, chest pain)       Fever, ear pain- left  Protocols used: Cough - Acute Productive-A-AH

## 2023-10-04 NOTE — Patient Instructions (Signed)
 It was great to see you!  Start doxycycline 1 tablet twice a day for 10 days  Continue your inhalers and nebulizers as prescribed  Continue mucinex to help with congestion and cough  Take diflucan 1 tablet after finishing antibiotic  Let's follow-up if your symptoms worsen or don't improve.   Take care,  Rodman Pickle, NP

## 2023-10-04 NOTE — Assessment & Plan Note (Signed)
 Chronic, exacerbated. No sick contacts. She has been on azithromycin twice in the last month after dental surgery and for similar symptoms a few weeks ago. Oxygen levels 92% on 3L oxygen. Will start doxycyline 100mg  BID x10 days. Continue stiologto respimat inhaler as prescribed, singulair, zyrtec, and mucinex. Encourage fluids. Diflucan 150mg  x1 after completing doxycycline to prevent yeast infection. Follow-up in person if not improving.

## 2023-10-04 NOTE — Progress Notes (Signed)
 [facility] [address] [place] [address] Dept: [phone] Dept Fax: [phone]  Virtual Video Visit  I connected with [name] on 10/04/23 at  2:00 PM EST by a video enabled telemedicine application and verified that I am speaking with the correct person using two identifiers.  Location patient: Home Location provider: Clinic Persons participating in the virtual visit: Patient; Rodman Pickle, NP; Jodelle Green, CMA  I discussed the limitations of evaluation and management by telemedicine and the availability of in person appointments. The patient expressed understanding and agreed to proceed.  Chief Complaint  Patient presents with   Acute Visit    Pt C/O of congestion, cough, nausea, vomiting and diarrhea for 3-4 days with left ear discomfort and fever of 101. Pt took OTC Tylenol and Mucinex DM for symptoms     SUBJECTIVE:  HPI: Kayla Ryan is a 68 y.o. female who presents with  UPPER RESPIRATORY TRACT INFECTION  Fever: yes Cough: yes - productive Shortness of breath: yes with activity - nebulizer helped last night Wheezing: yes - at times Chest pain: no Chest tightness: no Chest congestion: yes Nasal congestion: yes Runny nose: no Post nasal drip: no Sneezing: no Sore throat: yes Swollen glands: no Sinus pressure: no Headache: no Face pain: no Toothache: no Ear pain: yes left Ear pressure: yes left Eyes red/itching:no Eye drainage/crusting: no  Vomiting: yes Rash: no Fatigue: yes Sick contacts: no Strep contacts: no  Context: worse Recurrent sinusitis: no Relief with OTC cold/cough medications: yes  Treatments attempted: saline mist, nebulizer, zyrtec, singulair, stioloto respimat  Patient Active Problem List   Diagnosis Date Noted   COPD exacerbation (HCC) 10/04/2023   History of Crohn's disease 05/01/2023   Myopathy 05/01/2023   Atherosclerosis of aorta (HCC) 04/13/2023    Trigeminal neuralgia 04/11/2023   Encounter for general adult medical examination with abnormal findings 12/08/2022   Statin myopathy 12/08/2022   Acute sinusitis 11/04/2022   Physical deconditioning 11/04/2022   Umbilical hernia without obstruction and without gangrene 11/01/2022   Lipoma of neck 11/01/2022   History of cystocele 11/01/2022   Chronic prescription benzodiazepine use 06/03/2022   DDD (degenerative disc disease), cervical 04/13/2022   Vertigo 04/07/2022   Mass of postauricular area 04/07/2022   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   Pituitary microadenoma (HCC) 11/10/2021   Thyroid nodule 11/10/2021   AVM (arteriovenous malformation) of small bowel, acquired    Chronic respiratory failure with hypoxia (HCC) 01/28/2021   Pulmonary nodules 07/24/2020   (HFpEF) heart failure with preserved ejection fraction (HCC) 07/24/2020   Syncope 06/08/2019   Trigger finger, acquired 11/03/2017   Chronic obstructive pulmonary disease (HCC) 08/23/2017   Chronic pain 08/23/2017   Gastroesophageal reflux disease without esophagitis 05/22/2017   Nausea 04/20/2017   Primary insomnia 02/01/2017   Esophageal dysphagia 10/06/2016   Essential hypertension 07/17/2016   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   History of Bell's palsy 03/03/2015   Nuclear sclerosis of both eyes 03/03/2015   Hemifacial spasm 03/03/2015   Allergic rhinitis 05/18/2014   GAD (generalized anxiety disorder) 05/18/2014   Arthritis 05/18/2014   Asthma 05/18/2014   Chronic idiopathic constipation 05/18/2014   Dermatomyositis (HCC) 05/18/2014   Migraine 05/18/2014   Rectocele 05/18/2014   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   Peripheral neuropathy 05/18/2014   Right-sided Bell's palsy 04/29/2014   Angina pectoris (HCC) 10/30/2013   Hyperlipidemia 10/30/2013   Carotid  artery disease (HCC) 10/30/2013    Past Surgical History:   Procedure Laterality Date   ABDOMINAL HYSTERECTOMY     AGILE CAPSULE N/A 06/24/2021   Procedure: AGILE CAPSULE;  Surgeon: Lanelle Bal, DO;  [facility]: [facility];  Service: Endoscopy;  Laterality: N/A;   AGILE CAPSULE N/A 07/22/2021   Procedure: AGILE CAPSULE;  Surgeon: Corbin Ade, MD;  Location: [facility];  Service: Endoscopy;  Laterality: N/A;  7:30am   bone spur     BREAST SURGERY  1995   CHOLECYSTECTOMY     COLONOSCOPY WITH PROPOFOL N/A 04/30/2021   Procedure: COLONOSCOPY WITH PROPOFOL;  Surgeon: Corbin Ade, MD;  Location: [facility];  Service: Endoscopy;  Laterality: N/A;   ESOPHAGEAL DILATION N/A 04/30/2021   Procedure: ESOPHAGEAL DILATION;  Surgeon: Corbin Ade, MD;  [facility];  Service: Endoscopy;  Laterality: N/A;   ESOPHAGOGASTRODUODENOSCOPY (EGD) WITH PROPOFOL N/A 04/30/2021   Procedure: ESOPHAGOGASTRODUODENOSCOPY (EGD) WITH PROPOFOL;  Surgeon: Corbin Ade, MD;  [facility]: [facility];  Service: Endoscopy;  Laterality: N/A;   GIVENS CAPSULE STUDY N/A 09/22/2021   Procedure: GIVENS CAPSULE STUDY;  Surgeon: Corbin Ade, MD;  [facility]: [facility];  Service: Endoscopy;  Laterality: N/A;  7:30am   HERNIA REPAIR     LEFT HEART CATHETERIZATION WITH CORONARY ANGIOGRAM N/A 11/07/2013   Procedure: LEFT HEART CATHETERIZATION WITH CORONARY ANGIOGRAM;  Surgeon: Runell Gess, MD;  Location: [facility] CATH LAB;  Service: Cardiovascular;  Laterality: N/A;   MASTECTOMY PARTIAL / LUMPECTOMY     OOPHORECTOMY     ROTATOR CUFF REPAIR     TOOTH EXTRACTION N/A 04/15/2022   Procedure: DENTAL RESTORATION/EXTRACTIONS;  Surgeon: Ocie Doyne, DMD;  Location: [facility] OR;  Service: Oral Surgery;  Laterality: N/A;   TOOTH EXTRACTION N/A 09/15/2023   Procedure: DENTAL RESTORATION/EXTRACTIONS;  Surgeon: Ocie Doyne, DMD;  Location: [facility] OR;  Service: Oral Surgery;  Laterality: N/A;    Family History  Problem Relation Age of Onset   Lung cancer Mother    Cancer  Mother    Miscarriages / Stillbirths Mother    Varicose Veins Mother    Hypertension Mother    COPD Father    Lung cancer Father    Lymphoma Father    Alcohol abuse Father    Arthritis Father    Anxiety disorder Sister    Depression Brother    Anxiety disorder Sister    Intellectual disability Sister     Social History   Tobacco Use   Smoking status: Former    Current packs/day: 0.00    Average packs/day: 1 pack/day for 49.0 years (49.0 ttl pk-yrs)    Types: Cigarettes    Start date: 08/08/1966    Quit date: 08/31/2005    Years since quitting: 18.1   Smokeless tobacco: Never  Vaping Use   Vaping status: Never Used  Substance Use Topics   Alcohol use: No   Drug use: No    Comment: used marijuana in teens     Current Outpatient Medications:    ACCU-CHEK GUIDE test strip, USE TO CHECK BLOOD GLUCOSE THREE TIMES DAILY, MORNING, AT NOON, AND AT BEDTIME, Disp: 100 strip, Rfl: 0   Accu-Chek Softclix Lancets lancets, USE TO TEST BLOOD SUGAR EVERY MORNING, AT NOON AND EVERY NIGHT AT BEDTIME, Disp: 100 each, Rfl: 0   acyclovir (ZOVIRAX) 400 MG tablet, TAKE 1 TABLET(400 MG) BY MOUTH THREE TIMES DAILY (Patient taking differently: Take 400 mg by mouth  daily.), Disp: 30 tablet, Rfl: 3   albuterol (PROVENTIL) (2.5 MG/3ML) 0.083% nebulizer solution, Take 3 mLs (2.5 mg total) by nebulization every 6 (six) hours as needed for wheezing or shortness of breath., Disp: 75 mL, Rfl: 5   ASPERCREME LIDOCAINE EX, Apply 1 Application topically 3 (three) times daily as needed (pain)., Disp: , Rfl:    cetirizine (ZYRTEC) 10 MG tablet, Take 10 mg by mouth 2 (two) times daily., Disp: , Rfl:    Continuous Glucose Receiver (DEXCOM G7 RECEIVER) DEVI, USE TO CHECK BLOOD GLUCOSE AS NEEDED, Disp: 1 each, Rfl: 0   Continuous Glucose Sensor (DEXCOM G7 SENSOR) MISC, USE TO CHECK BLOOD SUGAR THREE TIMES DAILY, BEFORE MEALS AND AT BEDTIME AND AS NEEDED. CHANGE SENSOR EVERY 10 DAYS, Disp: 3 each, Rfl: 5   dicyclomine  (BENTYL) 10 MG capsule, Take 1 capsule (10 mg total) by mouth 4 (four) times daily -  before meals and at bedtime. Monitor for constipation, dry mouth, dizziness (Patient taking differently: Take 10 mg by mouth 4 (four) times daily as needed (Bowel Spasm/Diarrhea). Monitor for constipation, dry mouth, dizziness), Disp: 120 capsule, Rfl: 3   diphenhydrAMINE (BENADRYL) 25 MG tablet, Take 50 mg by mouth daily as needed for allergies or itching., Disp: , Rfl:    doxepin (SINEQUAN) 10 MG capsule, Take 1 capsule (10 mg total) by mouth at bedtime., Disp: 30 capsule, Rfl: 3   doxycycline (VIBRA-TABS) 100 MG tablet, Take 1 tablet (100 mg total) by mouth 2 (two) times daily., Disp: 20 tablet, Rfl: 0   DULoxetine (CYMBALTA) 60 MG capsule, TAKE 1 CAPSULE(60 MG) BY MOUTH TWICE DAILY, Disp: 60 capsule, Rfl: 3   fluconazole (DIFLUCAN) 150 MG tablet, Take 1 tablet (150 mg total) by mouth once for 1 dose., Disp: 1 tablet, Rfl: 0   gabapentin (NEURONTIN) 800 MG tablet, TAKE 1 TABLET(800 MG) BY MOUTH THREE TIMES DAILY, Disp: 90 tablet, Rfl: 5   Glucagon (GVOKE HYPOPEN 2-PACK) 1 MG/0.2ML SOAJ, Inject 0.2 mLs into the skin as needed (Blood glucose < 53)., Disp: 0.4 mL, Rfl: 5   Glycerin-Hypromellose-PEG 400 (DRY EYE RELIEF DROPS OP), Place 1 drop into both eyes 3 (three) times daily as needed (Dry eye)., Disp: , Rfl:    HYDROcodone-acetaminophen (NORCO) 10-325 MG tablet, Take 1 tablet by mouth every 4 (four) hours as needed. Stop Hydrocodone 7.5/325mg  tablet., Disp: 180 tablet, Rfl: 0   ipratropium (ATROVENT HFA) 17 MCG/ACT inhaler, Inhale 2 puffs into the lungs every 4 (four) hours as needed (COPD)., Disp: 1 each, Rfl: 4   isosorbide dinitrate (ISORDIL) 30 MG tablet, TAKE 1 TABLET(30 MG) BY MOUTH DAILY, Disp: 90 tablet, Rfl: 1   JARDIANCE 10 MG TABS tablet, TAKE 1 TABLET(10 MG) BY MOUTH DAILY BEFORE BREAKFAST, Disp: 30 tablet, Rfl: 5   Lancets Misc. (ACCU-CHEK SOFTCLIX LANCET DEV) KIT, USE TO CHECK GLUCOSE THREE TIMES  DAILY, Disp: 1 kit, Rfl: 0   LORazepam (ATIVAN) 0.5 MG tablet, Take 1 tablet (0.5 mg total) by mouth 3 (three) times daily as needed for anxiety., Disp: 90 tablet, Rfl: 3   meclizine (ANTIVERT) 25 MG tablet, TAKE 1 TABLET(25 MG) BY MOUTH THREE TIMES DAILY AS NEEDED FOR DIZZINESS, Disp: 30 tablet, Rfl: 1   methocarbamol (ROBAXIN) 500 MG tablet, TAKE 1 TABLET(500 MG) BY MOUTH EVERY 8 HOURS AS NEEDED FOR MUSCLE SPASMS, Disp: 30 tablet, Rfl: 1   Misc. Devices KIT, Henmnii rollator walker, Disp: 1 kit, Rfl: 0   montelukast (SINGULAIR) 10 MG tablet, TAKE 1  TABLET(10 MG) BY MOUTH AT BEDTIME, Disp: 30 tablet, Rfl: 3   nitroGLYCERIN (NITROSTAT) 0.4 MG SL tablet, Place 1 tablet (0.4 mg total) under the tongue every 5 (five) minutes as needed for chest pain., Disp: 90 tablet, Rfl: 3   omeprazole (PRILOSEC) 40 MG capsule, TAKE 1 CAPSULE(40 MG) BY MOUTH TWICE DAILY, Disp: 180 capsule, Rfl: 0   ondansetron (ZOFRAN-ODT) 8 MG disintegrating tablet, DISSOLVE 1 TABLET(8 MG) ON THE TONGUE EVERY 8 HOURS AS NEEDED FOR NAUSEA OR VOMITING, Disp: 257 tablet, Rfl: 1   STIOLTO RESPIMAT 2.5-2.5 MCG/ACT AERS, INHALE 2 PUFFS INTO THE LUNGS DAILY, Disp: 4 g, Rfl: 11   tirzepatide (MOUNJARO) 5 MG/0.5ML Pen, Inject 5 mg into the skin once a week., Disp: 6 mL, Rfl: 1   ezetimibe (ZETIA) 10 MG tablet, Take 1 tablet (10 mg total) by mouth daily., Disp: 90 tablet, Rfl: 3   HYDROcodone-acetaminophen (NORCO/VICODIN) 5-325 MG tablet, Take 1 tablet by mouth every 4 (four) hours as needed for moderate pain (pain score 4-6)., Disp: 30 tablet, Rfl: 0  Allergies  Allergen Reactions   Sulfa Antibiotics Shortness Of Breath, Swelling and Rash   Chicken Allergy Diarrhea and Nausea And Vomiting   Clindamycin/Lincomycin Itching and Nausea And Vomiting   Dilaudid [Hydromorphone Hcl]     Makes me crazy   Ferrlecit [Na Ferric Gluc Cplx In Sucrose] Itching    Itching and throat tightness   Iron     Throat starts closing up, tongue swells, severe  headaches and backpain   Metronidazole Diarrhea and Nausea And Vomiting   Other     Green peas - itching and nauea   Prednisone Other (See Comments)    Angry     Statins Other (See Comments)    Leg pain   Tuna Oil [Fish Oil] Diarrhea and Nausea And Vomiting   Budesonide Nausea And Vomiting and Rash    Nebulizing solution    Cephalexin Diarrhea and Nausea And Vomiting   Methotrexate Derivatives Swelling and Rash   Morphine And Codeine Itching and Anxiety    "exreme mood swings"   Tomato Itching and Rash    ROS: See pertinent positives and negatives per HPI.  OBSERVATIONS/OBJECTIVE:  VITALS per patient if applicable: Today's Vitals   10/04/23 1359  Pulse: (!) 56  SpO2: 92%   There is no height or weight on file to calculate BMI.    GENERAL: Alert and oriented. Appears well and in no acute distress.  HEENT: Atraumatic. Conjunctiva clear. No obvious abnormalities on inspection of external nose and ears.  NECK: Normal movements of the head and neck.  LUNGS: On inspection, no signs of respiratory distress. Breathing rate appears normal. No obvious gross SOB, gasping or wheezing, and no conversational dyspnea.  CV: No obvious cyanosis.  MS: Moves all visible extremities without noticeable abnormality.  PSYCH/NEURO: Pleasant and cooperative. No obvious depression or anxiety. Speech and thought processing grossly intact.  ASSESSMENT AND PLAN:  Problem List Items Addressed This Visit       Respiratory   COPD exacerbation (HCC) - Primary   Chronic, exacerbated. No sick contacts. She has been on azithromycin twice in the last month after dental surgery and for similar symptoms a few weeks ago. Oxygen levels 92% on 3L oxygen. Will start doxycyline 100mg  BID x10 days. Continue stiologto respimat inhaler as prescribed, singulair, zyrtec, and mucinex. Encourage fluids. Diflucan 150mg  x1 after completing doxycycline to prevent yeast infection. Follow-up in person if not  improving.  I discussed the assessment and treatment plan with the patient. The patient was provided an opportunity to ask questions and all were answered. The patient agreed with the plan and demonstrated an understanding of the instructions.   The patient was advised to call back or seek an in-person evaluation if the symptoms worsen or if the condition fails to improve as anticipated.   Gerre Scull, NP

## 2023-10-05 ENCOUNTER — Other Ambulatory Visit (HOSPITAL_COMMUNITY): Payer: Self-pay | Admitting: Internal Medicine

## 2023-10-05 ENCOUNTER — Other Ambulatory Visit: Payer: Self-pay | Admitting: Internal Medicine

## 2023-10-05 ENCOUNTER — Ambulatory Visit: Payer: Self-pay | Admitting: *Deleted

## 2023-10-05 DIAGNOSIS — Z1231 Encounter for screening mammogram for malignant neoplasm of breast: Secondary | ICD-10-CM

## 2023-10-05 DIAGNOSIS — J441 Chronic obstructive pulmonary disease with (acute) exacerbation: Secondary | ICD-10-CM

## 2023-10-05 NOTE — Patient Outreach (Unsigned)
 Care Coordination   Follow Up Visit Note   10/06/2023 Name: Kayla Ryan MRN: [id number] DOB: 1956/04/19  Kayla Ryan is a 68 y.o. year old female who sees Anabel Halon, MD for primary care. I spoke with  Kayla Ryan by phone today.  What matters to the patients health and wellness today?  Patient is getting over respiratory symptoms for the second time Seen by telemedicine NP Dentures got broken  Has green sputum and has had this for a few months - RN CM spoke with her prior to 09/04/23 and her sputum was green   Had all teeth pulled out  Her dentures are ready for pick up soon Pending   No pulmonologist   To have her mammogram & bone density on Monday 10/16/23 Had lump on left breast precancerous area on right breast. She voiced interest in having a chest xray on 10/16/23 collaborated with pcp/NP  Goals Addressed             This Visit's Progress    Patient will be able to have home management of her respiratory symptoms, chronic pain, IBS/Crohn's, COPD,diabetes, finances- RN CM services   Not on track    Patient will be able to attend all her medical appointments- 10/06/23 aware of scheduled appointments - assisted with  Patient will have decreased pulmonary worsening symptoms -10/06/23 confirmed her antibiotics has cause improvements but her secretions are now reported still green She had reported it clearer on 07/10/23 on 09/04/23 Patient will continue to follow the treatment plan of her pain management providers to maintain her pain at pain level of 5 or below as evidence by patient voiced pain levels- 10/06/23 doing better   Interventions Today    Flowsheet Row Most Recent Value  Chronic Disease   Chronic disease during today's visit Other  [Discolored sputum continues assist with scheduling imaging]  General Interventions   General Interventions Discussed/Reviewed General Interventions Reviewed, Doctor Visits, Communication with, Health Screening  Doctor Visits  Discussed/Reviewed Doctor Visits Reviewed, PCP, Specialist  Health Screening Bone Density, Mammogram  [assist with scheduling imaging  for each]  Communication with PCP/Specialists, RN  [Discolored/green sputum continues discussed with pcp and RN]  Exercise Interventions   Exercise Discussed/Reviewed Physical Activity, Exercise Reviewed  Education Interventions   Education Provided Provided Web-based Education, Provided Education  [avs education on nasal rinze, copd exacerbation]  Provided Verbal Education On Sick Day Rules, [employer]  Mental Health Interventions   Mental Health Discussed/Reviewed Mental Health Reviewed, Coping Strategies  Pharmacy Interventions   Pharmacy Dicussed/Reviewed Pharmacy Topics Reviewed, Medications and their functions, Affording Medications  Advanced Directive Interventions   Advanced Directives Discussed/Reviewed Advanced Directives Discussed  [not interested in discussing at this time]              SDOH assessments and interventions completed:  No     Care Coordination Interventions:  No, not indicated   Follow up plan: Follow up call scheduled for 11/03/23    Encounter Outcome:  Patient Visit Completed   [name], RN, BSN, CCM [place]  [facility], [facility] RN Care Manager Direct Dial: [phone]  Fax: [phone] Mailing Address: [address]  [place] [address] Website: [email or web address]

## 2023-10-06 DIAGNOSIS — J449 Chronic obstructive pulmonary disease, unspecified: Secondary | ICD-10-CM | POA: Diagnosis not present

## 2023-10-06 DIAGNOSIS — J439 Emphysema, unspecified: Secondary | ICD-10-CM | POA: Diagnosis not present

## 2023-10-06 NOTE — Patient Instructions (Addendum)
 Visit Information  Thank you for taking time to visit with me today. Please don't hesitate to contact me if I can be of assistance to you.   Following are the goals we discussed today:   Goals Addressed             This Visit's Progress    Patient will be able to have home management of her respiratory symptoms, chronic pain, IBS/Crohn's, COPD,diabetes, finances- RN CM services   Not on track    Patient will be able to attend all her medical appointments- 10/06/23 aware of scheduled appointments - assisted with  Patient will have decreased pulmonary worsening symptoms -10/06/23 confirmed her antibiotics has cause improvements but her secretions are now reported still green She had reported it clearer on 07/10/23 on 09/04/23 Patient will continue to follow the treatment plan of her pain management providers to maintain her pain at pain level of 5 or below as evidence by patient voiced pain levels- 10/06/23 doing better   Interventions Today    Flowsheet Row Most Recent Value  Chronic Disease   Chronic disease during today's visit Other  [Discolored sputum continues assist with scheduling imaging]  General Interventions   General Interventions Discussed/Reviewed General Interventions Reviewed, Doctor Visits, Communication with, Health Screening  Doctor Visits Discussed/Reviewed Doctor Visits Reviewed, PCP, Specialist  Health Screening Bone Density, Mammogram  [assist with scheduling imaging  for each]  Communication with PCP/Specialists, RN  [Discolored/green sputum continues discussed with pcp and RN]  Exercise Interventions   Exercise Discussed/Reviewed Physical Activity, Exercise Reviewed  Education Interventions   Education Provided Provided Web-based Education, Provided Education  [avs education on nasal rinze, copd exacerbation]  Provided Verbal Education On Sick Day Rules, [employer]  Mental Health Interventions   Mental Health Discussed/Reviewed Mental Health Reviewed,  Coping Strategies  Pharmacy Interventions   Pharmacy Dicussed/Reviewed Pharmacy Topics Reviewed, Medications and their functions, Affording Medications  Advanced Directive Interventions   Advanced Directives Discussed/Reviewed Advanced Directives Discussed  [not interested in discussing at this time]              Our next appointment is by telephone on 11/03/23 at 2:45 pm  Please call the care guide team at [phone] if you need to cancel or reschedule your appointment.   If you are experiencing a Mental Health or Behavioral Health Crisis or need someone to talk to, please call the Suicide and Crisis Lifeline: 988 call the [place] National Suicide Prevention Lifeline: 445-757-0341 or TTY: 423 484 7850 TTY 2493844317) to talk to a trained counselor call 1-800-273-TALK (toll free, 24 hour hotline) call the [facility]: 484-059-9798 call 911   Patient verbalizes understanding of instructions and care plan provided today and agrees to view in MyChart. Active MyChart status and patient understanding of how to access instructions and care plan via MyChart confirmed with patient.     The patient has been provided with contact information for the care management team and has been advised to call with any health related questions or concerns.   Joie Reamer L. Noelle Penner, RN, BSN, CCM [place]  [facility], [facility] RN Care Manager Direct Dial: [phone]  Fax: [phone] Mailing Address: [address]  [place] [address] Website: [email or web address]

## 2023-10-11 ENCOUNTER — Ambulatory Visit: Payer: 59

## 2023-10-16 ENCOUNTER — Other Ambulatory Visit (HOSPITAL_COMMUNITY): Payer: 59

## 2023-10-16 ENCOUNTER — Ambulatory Visit (HOSPITAL_COMMUNITY): Payer: 59

## 2023-10-16 ENCOUNTER — Encounter (HOSPITAL_COMMUNITY): Payer: Self-pay

## 2023-10-18 ENCOUNTER — Ambulatory Visit: Payer: 59 | Admitting: Emergency Medicine

## 2023-10-20 ENCOUNTER — Other Ambulatory Visit: Payer: Self-pay | Admitting: Internal Medicine

## 2023-10-20 DIAGNOSIS — M5030 Other cervical disc degeneration, unspecified cervical region: Secondary | ICD-10-CM

## 2023-10-20 DIAGNOSIS — R42 Dizziness and giddiness: Secondary | ICD-10-CM

## 2023-10-21 DIAGNOSIS — J449 Chronic obstructive pulmonary disease, unspecified: Secondary | ICD-10-CM | POA: Diagnosis not present

## 2023-10-21 DIAGNOSIS — J9611 Chronic respiratory failure with hypoxia: Secondary | ICD-10-CM | POA: Diagnosis not present

## 2023-10-23 ENCOUNTER — Ambulatory Visit: Payer: 59 | Admitting: Urology

## 2023-10-24 ENCOUNTER — Inpatient Hospital Stay: Payer: 59 | Attending: Hematology

## 2023-10-24 DIAGNOSIS — N1832 Chronic kidney disease, stage 3b: Secondary | ICD-10-CM | POA: Diagnosis not present

## 2023-10-24 DIAGNOSIS — R42 Dizziness and giddiness: Secondary | ICD-10-CM | POA: Insufficient documentation

## 2023-10-24 DIAGNOSIS — K921 Melena: Secondary | ICD-10-CM | POA: Insufficient documentation

## 2023-10-24 DIAGNOSIS — D500 Iron deficiency anemia secondary to blood loss (chronic): Secondary | ICD-10-CM

## 2023-10-24 DIAGNOSIS — D509 Iron deficiency anemia, unspecified: Secondary | ICD-10-CM | POA: Insufficient documentation

## 2023-10-24 LAB — IRON AND TIBC
Iron: 94 ug/dL (ref 28–170)
Saturation Ratios: 25 % (ref 10.4–31.8)
TIBC: 380 ug/dL (ref 250–450)
UIBC: 286 ug/dL

## 2023-10-24 LAB — CBC WITH DIFFERENTIAL/PLATELET
Abs Immature Granulocytes: 0.06 10*3/uL (ref 0.00–0.07)
Basophils Absolute: 0.1 10*3/uL (ref 0.0–0.1)
Basophils Relative: 1 %
Eosinophils Absolute: 0.5 10*3/uL (ref 0.0–0.5)
Eosinophils Relative: 6 %
HCT: 43.4 % (ref 36.0–46.0)
Hemoglobin: 13.5 g/dL (ref 12.0–15.0)
Immature Granulocytes: 1 %
Lymphocytes Relative: 39 %
Lymphs Abs: 3 10*3/uL (ref 0.7–4.0)
MCH: 28.5 pg (ref 26.0–34.0)
MCHC: 31.1 g/dL (ref 30.0–36.0)
MCV: 91.8 fL (ref 80.0–100.0)
Monocytes Absolute: 0.6 10*3/uL (ref 0.1–1.0)
Monocytes Relative: 8 %
Neutro Abs: 3.4 10*3/uL (ref 1.7–7.7)
Neutrophils Relative %: 45 %
Platelets: 550 10*3/uL — ABNORMAL HIGH (ref 150–400)
RBC: 4.73 MIL/uL (ref 3.87–5.11)
RDW: 12.9 % (ref 11.5–15.5)
WBC: 7.7 10*3/uL (ref 4.0–10.5)
nRBC: 0 % (ref 0.0–0.2)

## 2023-10-24 LAB — FERRITIN: Ferritin: 99 ng/mL (ref 11–307)

## 2023-10-26 ENCOUNTER — Encounter

## 2023-10-27 NOTE — Progress Notes (Unsigned)
 VIRTUAL VISIT via TELEPHONE NOTE [facility]   I connected with Kayla Ryan  on 10/30/2023 at 9:10 AM by telephone and verified that I am speaking with the correct person using two identifiers.  Location: Patient: Home Provider: [facility]   I discussed the limitations, risks, security and privacy concerns of performing an evaluation and management service by telephone and the availability of in person appointments. I also discussed with the patient that there may be a patient responsible charge related to this service. The patient expressed understanding and agreed to proceed.  REASON FOR VISIT:  Follow-up for iron deficiency anemia   CURRENT THERAPY: IV iron infusions   INTERVAL HISTORY:  Ms. Kayla Ryan is contacted today for follow-up of iron deficiency anemia,.  She was last evaluated via telemedicine visit by Rojelio Brenner PA-C on 06/20/2023.  Since her last visit, she received Ferrlecit 125 mg x 3 doses. Tolerated Ferrlecit from 07/13/2023 fairly well, but experienced hypersensitivity symptoms (diffuse itching and throat tightness, without any respiratory distress) during Ferrlecit infusion on 07/20/2023.  She was given IV Solu-Medrol 125 mg IV, with resolution of symptoms by the time she had finished Ferrlecit infusion.   She received IV Solu-Medrol 125 mg prior to her Ferrlecit infusion on 07/27/2023, but still experienced generalized itching; she was unable to complete this dose.  Overall, at today's visit she reports feeling better with improved energy.   She continues to have issues with chronic diarrhea.  She has intermittent melanotic ("coffee-ground diarrhea") stool, but reports that this is improved and she is now noticing it only once or twice a month. She denies any bright red blood per rectum. Her ice pica resolved.  Her headaches and lightheadedness improved, but she does have some vertigo related to inner ear.  Other symptoms as  reported in ROS below.   She has 75% energy and 75% appetite. She endorses that she is maintaining a stable weight.  ASSESSMENT & PLAN:  1.  Normocytic anemia: - Etiology chronic GI blood loss, CKD stage IIIb, and iron deficiency.  - Hospitalized in November 2022 with Hgb 5.8, s/p 4 units PRBC - Most recent blood transfusion was in January 2023. - Colonoscopy and EGD (04/30/2021) showed no obvious source of blood loss. - Capsule study (09/22/2021): Few gastric erosions, nonbleeding.  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 - She cannot take oral iron due to allergies (tongue swells, rash, loin pain).  High intolerance of IV iron with significant side effects (see below). - Most recent IV iron with Ferrlecit x 3 in December 2024.  (See below) - Apart from the side effects of the iron itself, she reports that she felt improved energy after her IV iron. - She has black tarry stools on occasion - Most recent labs (10/24/2023): Hgb 13.5/MCV 91.8, ferritin 99, iron saturation 25 % with TIBC 380.  (Thrombocytosis with platelets 550 despite adequate iron levels is noted, will monitor and consider MPN workup if persistent) - PLAN: No indication for IV iron at this time.   - Labs and RTC in 3 months - will follow platelets and consider MPN workup if they remain elevated - Patient advised to continue close follow-up with GI   2.  Intolerance of IV iron infusions  - Reports previous reactions to iron infusions with Venofer (severe nausea, rash, itching, myalgias). She has received IV Venofer 100 mg every 5 days in [facility] under the direction of Dr. Ottis Stain.  (  Per notes by Dr. Ottis Stain from 2020, patient was receiving monthly Venofer as maintenance.)  She is also unable to tolerate steroid premedications due to agitation and mood changes. - Patient experienced severe low back pain, nausea, vomiting, and diarrhea after IV Venofer received in July/August 2023 - Trial of Feraheme in  November/December 2023, due to more favorable adverse event profile Low-dose Feraheme (90 mg) on 06/15/2022, and reported diarrhea and nausea for the next several days.   Full dose Feraheme (510 mg) on 08/04/2022, and called the next day to report severe diarrhea and rash. Feraheme (510 mg) x 2  on 03/24/2023 and 04/03/2023.  Called on 04/05/2023 to report severe nausea, diarrhea, abdominal distention, rash across her abdomen, severe leg cramps extending into her thighs, and difficulty swallowing.  Advised to go to ED, but declined to do so.   -Trial of Ferrlecit in December 2024, but unable to tolerate due to diffuse pruritus (no rash) experienced during infusion, despite premedicating with IV Solu-Medrol. - Premedication including IV Solu-Medrol, IV Pepcid, IV cetirizine, and Tylenol.  (Patient previously requested to avoid IV steroids due to side effects of mood swings and aggression, but she has been unable to tolerate iron infusions without steroids.) - Also requires antiemetic prior to infusions. Zofran and dicyclomine improved her nausea and diarrhea after iron infusions. Has at times required prn anxiolytic. - PLAN: Overall, patient feels that she tolerated Feraheme the best, as she denies any hypersensitivity symptoms during infusion, but did have some reported diarrhea and nausea following Feraheme infusions.     3.  Noncompliance / barriers to care - Patient has had frequent cancellations of her IV iron - Patient cites reason for cancellations as being severe chronic diarrhea related to her IBS, somewhat relieved with Bentyl and Imodium, following with GI.  Diarrhea tends to be better in the afternoons, therefore she prefers afternoon appointments for IV iron. 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 - After appointment and discussion of the above in August 2024, patient was apologetic.  Patient has exhibited significant effort at compliance and  has done much better attending her appointments and treatments since that time. - Assessed by clinical social worker on 07/13/2023 - Patient given information on local therapists who may be able to assist with generalized anxiety and processing trauma    4. Other history - PMH: CKD stage IIIb.  COPD with chronic hypoxic respiratory failure (on 3 L supplemental oxygen continuously), dermatomyositis/muscular dystrophy, GERD, history of Crohn's disease but with recent EGD/colonoscopy negative for any signs of Crohn's. - She is widowed.  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  Quit smoking 20+ years ago. - Mother had lung cancer and father had lung cancer.  Niece had melanoma.   PLAN SUMMARY:   >> Labs (CBC/D, ferritin, iron/TIBC) in 3 months >> PHONE visit in 3 months (after labs)   ** Last office visit 03/20/23     REVIEW OF SYSTEMS:   Review of Systems  Constitutional:  Negative for chills, diaphoresis, fever, malaise/fatigue and weight loss.  HENT:         Difficulty chewing and swallowing due to dentures  Respiratory:  Positive for cough and shortness of breath (COPD).   Cardiovascular:  Negative for chest pain and palpitations.  Gastrointestinal:  Positive for diarrhea. Negative for abdominal pain, blood in stool, melena, nausea and vomiting.  Genitourinary:  Negative for frequency.  Musculoskeletal:  Positive for joint pain and myalgias.  Neurological:  Positive for dizziness and tingling. Negative for headaches.  Psychiatric/Behavioral:  Negative for depression.      PHYSICAL EXAM: (per limitations of virtual telephone visit)  The patient is alert and oriented x 3, exhibiting adequate mentation, good mood, and ability to speak in full sentences and execute sound judgement.  WRAP UP:   I discussed the assessment and treatment plan with the patient. The patient was provided an opportunity to ask questions and  all were answered. The patient agreed with the plan and demonstrated an understanding of the instructions.   The patient was advised to call back or seek an in-person evaluation if the symptoms worsen or if the condition fails to improve as anticipated.  I provided 22 minutes of non-face-to-face time during this encounter, including > 10 minutes of medical discussion.  Carnella Guadalajara, PA-C 10/30/23 9:34 AM

## 2023-10-30 ENCOUNTER — Other Ambulatory Visit: Payer: Self-pay | Admitting: Hematology

## 2023-10-30 ENCOUNTER — Inpatient Hospital Stay (HOSPITAL_BASED_OUTPATIENT_CLINIC_OR_DEPARTMENT_OTHER): Payer: 59 | Admitting: Physician Assistant

## 2023-10-30 ENCOUNTER — Ambulatory Visit: Payer: 59 | Admitting: Cardiology

## 2023-10-30 DIAGNOSIS — D500 Iron deficiency anemia secondary to blood loss (chronic): Secondary | ICD-10-CM | POA: Diagnosis not present

## 2023-10-31 ENCOUNTER — Encounter: Payer: Self-pay | Admitting: Hematology

## 2023-11-03 ENCOUNTER — Ambulatory Visit: Payer: Self-pay | Admitting: *Deleted

## 2023-11-03 NOTE — Patient Instructions (Addendum)
 Visit Information  Thank you for taking time to visit with me today. Please don't hesitate to contact me if I can be of assistance to you.    Danise Edge will be the re assigned RN CM for [facility] office MDs - [phone] Please feel free to call her or me if needed   Following are the goals we discussed today:   Goals Addressed             This Visit's Progress    Patient will be able to have decrease green respiratory secretions + no worsening COPD,diabetes issues/fall prevention  - RN CM services   Not on track    Patient will be able to attend all her medical appointments- 10/06/23 aware of scheduled appointments - 11/03/23 goal met  Patient will have decreased pulmonary worsening symptoms -11/03/23 confirmed her antibiotics has cause improvements but her secretions are now reported still green She had reported it clearer on 07/10/23 on 09/04/23 Patient will continue to follow the treatment plan of her pain management providers to maintain her pain at pain level of 5 or below as evidence by patient voiced pain levels- 11/03/23 doing better level 5 but recovering from recent fall  Interventions Today    Flowsheet Row Most Recent Value  Chronic Disease   Chronic disease during today's visit Diabetes, Chronic Obstructive Pulmonary Disease (COPD), Hypertension (HTN), Other  [fall 10/31/23/facial injury, recurrent green secretion, Inquiry about a biopsy listed on EOB]  General Interventions   General Interventions Discussed/Reviewed General Interventions Reviewed, Doctor Visits, Communication with  Doctor Visits Discussed/Reviewed Doctor Visits Reviewed, PCP, Specialist  PCP/Specialist Visits Compliance with follow-up visit  Communication with PCP/Specialists  [in basket message to inquire with patient permission for possible earlier appointment to evaluate recurrent productive green secretions]  Exercise Interventions   Exercise Discussed/Reviewed Exercise Reviewed,  Physical Activity  [encouraged]  Education Interventions   Education Provided Provided Education  [increase platelets]  Provided Verbal Education On Labs, Other  Mental Health Interventions   Mental Health Discussed/Reviewed Mental Health Reviewed, Coping Strategies  Pharmacy Interventions   Pharmacy Dicussed/Reviewed Pharmacy Topics Reviewed, Affording Medications  Safety Interventions   Safety Discussed/Reviewed Safety Reviewed, Fall Risk, Home Safety  [encouraged motion lights when waking to bathroom at nights]  Home Safety Assistive Devices              Our next appointment is by telephone on date preferred by re assigned RN CM at ? time  Please call the care guide team at [phone] if you need to cancel or reschedule your appointment.   If you are experiencing a Mental Health or Behavioral Health Crisis or need someone to talk to, please call the Suicide and Crisis Lifeline: 988 call the [place] National Suicide Prevention Lifeline: 228-489-4051 or TTY: 606-870-5678 TTY (435)276-6021) to talk to a trained counselor call 1-800-273-TALK (toll free, 24 hour hotline) call the [facility]: 708-196-4394 call 911   Patient verbalizes understanding of instructions and care plan provided today and agrees to view in MyChart. Active MyChart status and patient understanding of how to access instructions and care plan via MyChart confirmed with patient.     The patient has been provided with contact information for the care management team and has been advised to call with any health related questions or concerns.   Indica Marcott L. Noelle Penner, RN, BSN, CCM [place]  [facility], [facility] Health RN Care Manager Direct Dial: [phone]  Fax: [phone]

## 2023-11-03 NOTE — Patient Outreach (Signed)
 Care Coordination   Follow Up Visit Note   11/03/2023 Name: Kayla Ryan MRN: [id number] DOB: May 24, 1956  Kayla Ryan is a 68 y.o. year old female who sees Anabel Halon, MD for primary care. I spoke with  [name] by phone today.  What matters to the patients health and wellness today?  Recent fall (10/31/23) with facial injury. She was walking in the darkness and tripped over her oxygen tubing. She fell on her face/nose  Finished her antibiotics and 3 days afterwards she had productive yellow secretions that eventually turned green To see Dr Delton Coombes in May 2025 She agrees for RN CM to outreach to Dr Delton Coombes for a possible earlier appointment to evaluate the recurrent green secretions produced in the back of her throat Plus her platelets were elevated recently   She voiced appreciation for care provided and understanding of re assignment of RN CM to [employer]	[place]   Goals Addressed             This Visit's Progress    Patient will be able to have decrease green respiratory secretions + no worsening COPD,diabetes issues/fall prevention  - RN CM services   Not on track    Patient will be able to attend all her medical appointments- 10/06/23 aware of scheduled appointments - 11/03/23 goal met  Patient will have decreased pulmonary worsening symptoms -11/03/23 confirmed her antibiotics has cause improvements but her secretions are now reported still green She had reported it clearer on 07/10/23 on 09/04/23 Patient will continue to follow the treatment plan of her pain management providers to maintain her pain at pain level of 5 or below as evidence by patient voiced pain levels- 11/03/23 doing better level 5 but recovering from recent fall  Interventions Today    Flowsheet Row Most Recent Value  Chronic Disease   Chronic disease during today's visit Diabetes, Chronic Obstructive Pulmonary Disease (COPD), Hypertension (HTN), Other  [fall 10/31/23/facial injury, recurrent green secretion,  Inquiry about a biopsy listed on EOB]  General Interventions   General Interventions Discussed/Reviewed General Interventions Reviewed, Doctor Visits, Communication with  Doctor Visits Discussed/Reviewed Doctor Visits Reviewed, PCP, Specialist  PCP/Specialist Visits Compliance with follow-up visit  Communication with PCP/Specialists  [in basket message to inquire with patient permission for possible earlier appointment to evaluate recurrent productive green secretions]  Exercise Interventions   Exercise Discussed/Reviewed Exercise Reviewed, Physical Activity  [encouraged]  Education Interventions   Education Provided Provided Education  [increase platelets]  Provided Verbal Education On Labs, Other  Mental Health Interventions   Mental Health Discussed/Reviewed Mental Health Reviewed, Coping Strategies  Pharmacy Interventions   Pharmacy Dicussed/Reviewed Pharmacy Topics Reviewed, Affording Medications  Safety Interventions   Safety Discussed/Reviewed Safety Reviewed, Fall Risk, Home Safety  [encouraged motion lights when waking to bathroom at nights]  Home Safety Assistive Devices              SDOH assessments and interventions completed:  No     Care Coordination Interventions:  Yes, provided   Follow up plan:  future outreach from Doreatha Lew, RN CM     Encounter Outcome:  Patient Visit Completed   [name], RN, BSN, CCM [place]  [facility], [facility] Health RN Care Manager Direct Dial: [phone]  Fax: [phone]

## 2023-11-05 DIAGNOSIS — J439 Emphysema, unspecified: Secondary | ICD-10-CM | POA: Diagnosis not present

## 2023-11-05 DIAGNOSIS — J449 Chronic obstructive pulmonary disease, unspecified: Secondary | ICD-10-CM | POA: Diagnosis not present

## 2023-11-08 ENCOUNTER — Ambulatory Visit

## 2023-11-10 ENCOUNTER — Other Ambulatory Visit: Payer: Self-pay | Admitting: Hematology

## 2023-11-10 ENCOUNTER — Other Ambulatory Visit: Payer: Self-pay | Admitting: Cardiology

## 2023-11-10 NOTE — Patient Instructions (Signed)
 Visit Information  Thank you for taking time to visit with me today. Please don't hesitate to contact me if I can be of assistance to you.   Following are the goals we discussed today:   Goals Addressed             This Visit's Progress    Patient will be able to have decrease green respiratory secretions + no worsening COPD,diabetes issues/fall prevention  - RN CM services       Patient will be able to attend all her medical appointments-   Patient will have decreased pulmonary worsening symptoms to include diarrhea  confirmed her antibiotics has cause improvements but her secretions are now reported still green She had reported it clearer on 07/10/23 on 09/04/23  Interventions Today    Flowsheet Row Most Recent Value  Chronic Disease   Chronic disease during today's visit Diabetes, Chronic Obstructive Pulmonary Disease (COPD), Hypertension (HTN), Other  [recurrent green respiratory secretions after antibiotics completed diarrhea]  General Interventions   General Interventions Discussed/Reviewed General Interventions Reviewed, Sick Day Rules, Doctor Visits  Doctor Visits Discussed/Reviewed Doctor Visits Reviewed, PCP, Specialist  [discussed infectious disease providers]  PCP/Specialist Visits Compliance with follow-up visit  Education Interventions   Education Provided Provided Education  [discussed infectious disease providers BRAT diet]  Provided Verbal Education On Sick Day Rules, Nutrition, Medication, When to see the doctor  Mental Health Interventions   Mental Health Discussed/Reviewed Mental Health Reviewed, Coping Strategies  Nutrition Interventions   Nutrition Discussed/Reviewed Nutrition Reviewed, Adding fruits and vegetables, Fluid intake, Supplemental nutrition  [BRAT diet]  Pharmacy Interventions   Pharmacy Dicussed/Reviewed Pharmacy Topics Reviewed, Affording Medications  Safety Interventions   Safety Discussed/Reviewed Safety Reviewed, Fall Risk, Home Safety  Home  Safety Assistive Devices              Our next appointment is by telephone on 09/04/23 at 1:45 pm  Please call the care guide team at [phone] if you need to cancel or reschedule your appointment.   If you are experiencing a Mental Health or Behavioral Health Crisis or need someone to talk to, please call the Suicide and Crisis Lifeline: 988 call the [place] National Suicide Prevention Lifeline: 720-163-0774 or TTY: 307-872-3437 TTY 224-356-9453) to talk to a trained counselor call 1-800-273-TALK (toll free, 24 hour hotline) call the [facility]: 9517410696 call 911   Patient verbalizes understanding of instructions and care plan provided today and agrees to view in MyChart. Active MyChart status and patient understanding of how to access instructions and care plan via MyChart confirmed with patient.     The patient has been provided with contact information for the care management team and has been advised to call with any health related questions or concerns.    Ronnie Mallette L. Noelle Penner, RN, BSN, CCM [facility] Health RN Care Manager [phone]

## 2023-11-19 ENCOUNTER — Other Ambulatory Visit: Payer: Self-pay | Admitting: Internal Medicine

## 2023-11-19 DIAGNOSIS — M5030 Other cervical disc degeneration, unspecified cervical region: Secondary | ICD-10-CM

## 2023-11-20 DIAGNOSIS — M797 Fibromyalgia: Secondary | ICD-10-CM | POA: Diagnosis not present

## 2023-11-20 DIAGNOSIS — M47816 Spondylosis without myelopathy or radiculopathy, lumbar region: Secondary | ICD-10-CM | POA: Diagnosis not present

## 2023-11-20 DIAGNOSIS — G894 Chronic pain syndrome: Secondary | ICD-10-CM | POA: Diagnosis not present

## 2023-11-20 DIAGNOSIS — Z79891 Long term (current) use of opiate analgesic: Secondary | ICD-10-CM | POA: Diagnosis not present

## 2023-11-21 DIAGNOSIS — J9611 Chronic respiratory failure with hypoxia: Secondary | ICD-10-CM | POA: Diagnosis not present

## 2023-11-21 DIAGNOSIS — J449 Chronic obstructive pulmonary disease, unspecified: Secondary | ICD-10-CM | POA: Diagnosis not present

## 2023-11-22 ENCOUNTER — Ambulatory Visit

## 2023-11-28 ENCOUNTER — Ambulatory Visit: Admitting: Gastroenterology

## 2023-11-28 ENCOUNTER — Telehealth: Payer: Self-pay | Admitting: *Deleted

## 2023-11-28 ENCOUNTER — Telehealth (INDEPENDENT_AMBULATORY_CARE_PROVIDER_SITE_OTHER): Payer: Self-pay | Admitting: *Deleted

## 2023-11-28 ENCOUNTER — Encounter: Payer: Self-pay | Admitting: Gastroenterology

## 2023-11-28 ENCOUNTER — Encounter: Payer: Self-pay | Admitting: *Deleted

## 2023-11-28 ENCOUNTER — Other Ambulatory Visit: Payer: Self-pay | Admitting: Internal Medicine

## 2023-11-28 VITALS — BP 117/57 | HR 86 | Temp 97.8°F | Ht 68.0 in | Wt 158.7 lb

## 2023-11-28 DIAGNOSIS — K529 Noninfective gastroenteritis and colitis, unspecified: Secondary | ICD-10-CM

## 2023-11-28 DIAGNOSIS — R7401 Elevation of levels of liver transaminase levels: Secondary | ICD-10-CM | POA: Diagnosis not present

## 2023-11-28 DIAGNOSIS — R197 Diarrhea, unspecified: Secondary | ICD-10-CM | POA: Diagnosis not present

## 2023-11-28 DIAGNOSIS — R1310 Dysphagia, unspecified: Secondary | ICD-10-CM | POA: Diagnosis not present

## 2023-11-28 DIAGNOSIS — K921 Melena: Secondary | ICD-10-CM | POA: Insufficient documentation

## 2023-11-28 MED ORDER — DICYCLOMINE HCL 10 MG PO CAPS
10.0000 mg | ORAL_CAPSULE | Freq: Three times a day (TID) | ORAL | 3 refills | Status: AC
Start: 2023-11-28 — End: ?

## 2023-11-28 MED ORDER — RIFAXIMIN 550 MG PO TABS: 550.0000 mg | ORAL_TABLET | Freq: Three times a day (TID) | ORAL | 0 refills | Status: DC

## 2023-11-28 NOTE — Telephone Encounter (Signed)
 Pt has been scheduled for 12/14/23. Instructions sent via mychart

## 2023-11-28 NOTE — Patient Instructions (Signed)
 We are setting you up for an upper endoscopy with dilation by Dr. Mordechai April in the near future.   You will need to hold Mounjaro  one week prior  Hold Jardiance  72 hours prior  I have sent in Xifaxan  to take three times a day just for 14 days. This is for diarrhea and bacterial overgrowth.   We will probably have to do a prior authorization for this with insurance.  Please have blood work done at [facility].  We will see you in 3 months regardless!  It was a pleasure to see you today. I want to create trusting relationships with patients and provide genuine, compassionate, and quality care. I truly value your feedback, so please be on the lookout for a survey regarding your visit with me today. I appreciate your time in completing this!         Delman Ferns, PhD, ANP-BC [facility]

## 2023-11-28 NOTE — Telephone Encounter (Signed)
 LMOVM to return call  EGD/ED asa 3 w/Dr.Carver

## 2023-11-28 NOTE — H&P (View-Only) (Signed)
 Gastroenterology Office Note     Primary Care Physician:  Meldon Sport, MD  Primary Gastroenterologist: Dr Mordechai April   Chief Complaint   Chief Complaint  Patient presents with   Gastroesophageal Reflux    Having reflux, nausea coughing when swallowing every time and sometimes choking. Takes zofran  8 mg for nausea and it helps. Taking prilosec twice a day.      History of Present Illness   Kayla Ryan is a 68 y.o. female presenting today with a history of  COPD, dermatomyositis, GERD, questionable Crohn's disease in the past but 2022 EGD/colonoscopy without Crohns, capsule in 2023 unrevealing, IDA requiring transfusions in the past, chronic diarrhea, last seen virtually in 2023.    Got sick in October with sinus infections, had 3 rounds of abx, finally better end of Feb/March. Since that time, coughing after liquid and soft foods. Also notes solid food dysphagia and regurgitates. Has choked twice in the past month: chicken. Green beans. No odynophagia.Got denures one week ago and had modified diet with soft, puree food. Even smoothie can cough and choke.   Thyroid  scan upcoming. Has nodules in right side of neck. Enlarged area of right neck.   Having hunger pains after eating. Nausea, Having very dark stools like coffee colored. Not daily but at least two times a week.   Recent Hgb 13.5, ferrritin 99. Ferritin was 5 in Aug 2024. Iron  infusions in December. Does not take oral iron . Allergic to oral and IV iron  and has to be premedicated. No NSAIDs.    Diarrhea every day, maybe once a week will feel constipated watery stool about 4 times a day, as much as 8-10. Taking Bentyl  just as needed, if goes more than 4 times a day. Will get constipated with dicyclomine . She wonders if may be baseline constipated.    Chronic diarrhea: sometimes willl have one day of no BM and takes MOM as feels miserable. Metamucil made her bloated. Well balanced diet with lots of fiber. Bloating after  each meal.   3 liters O2 continously. Stable at baseline.    Previous pertinent work up:   Colonoscopy September 2022: -Diverticulosis in the sigmoid colon -Redundant and elongated colon  Remote hx of polyps (Dr Merlyn Starring over 5 years ago).    EGD September 2022: - Normal esophagus. Status post esophageal dilation - Normal stomach. - Normal duodenal bulb, second portion of the duodenum and third portion of the duodenum.  Capsule Feb 2023: few stomach erosions, 3 AVMs, no signs of IBD.    Diagnosed with Crohn's at [facility]. Records not available.  Niece with Crohn's   Father: mesenteric ischemia, passed   Past Medical History:  Diagnosis Date   Allergy Unknown   Anemia    Anxiety 1883   Arthritis    Asthma    CHF (congestive heart failure) (HCC) 2021   Chronic bronchitis    Crohn disease (HCC)    Emphysema    Emphysema of lung (HCC) 2003   Fibromyalgia    GERD (gastroesophageal reflux disease)    GI bleed    Herpes    History of blood transfusion    Hyperlipidemia    Hypertension 2015   IBS (irritable bowel syndrome)    Kidney failure    Migraines    Muscular dystrophy (HCC)    Neck pain    Oxygen  deficiency 2021   PAF (paroxysmal atrial fibrillation) (HCC)    Not anticoagulated with history of severe GI bleeding  Plantar fasciitis    Polymyositis (HCC)    PONV (postoperative nausea and vomiting)    Right thyroid  nodule 04/07/2022   Scoliosis    Type 2 diabetes mellitus (HCC)     Past Surgical History:  Procedure Laterality Date   ABDOMINAL HYSTERECTOMY     AGILE CAPSULE N/A 06/24/2021   Procedure: AGILE CAPSULE;  Surgeon: Vinetta Greening, DO;  [facility]: [facility];  Service: Endoscopy;  Laterality: N/A;   AGILE CAPSULE N/A 07/22/2021   Procedure: AGILE CAPSULE;  Surgeon: Suzette Espy, MD;  Location: [facility];  Service: Endoscopy;  Laterality: N/A;  7:30am   bone spur     BREAST SURGERY  1995   CHOLECYSTECTOMY     COLONOSCOPY WITH  PROPOFOL  N/A 04/30/2021   Procedure: COLONOSCOPY WITH PROPOFOL ;  Surgeon: Suzette Espy, MD;  Location: [facility];  Service: Endoscopy;  Laterality: N/A;   ESOPHAGEAL DILATION N/A 04/30/2021   Procedure: ESOPHAGEAL DILATION;  Surgeon: Suzette Espy, MD;  [facility]: [facility];  Service: Endoscopy;  Laterality: N/A;   ESOPHAGOGASTRODUODENOSCOPY (EGD) WITH PROPOFOL  N/A 04/30/2021   Procedure: ESOPHAGOGASTRODUODENOSCOPY (EGD) WITH PROPOFOL ;  Surgeon: Suzette Espy, MD;  [facility]: [facility];  Service: Endoscopy;  Laterality: N/A;   GIVENS CAPSULE STUDY N/A 09/22/2021   Procedure: GIVENS CAPSULE STUDY;  Surgeon: Suzette Espy, MD;  Location: [facility];  Service: Endoscopy;  Laterality: N/A;  7:30am   HERNIA REPAIR     LEFT HEART CATHETERIZATION WITH CORONARY ANGIOGRAM N/A 11/07/2013   Procedure: LEFT HEART CATHETERIZATION WITH CORONARY ANGIOGRAM;  Surgeon: Avanell Leigh, MD;  Location: [facility] CATH LAB;  Service: Cardiovascular;  Laterality: N/A;   MASTECTOMY PARTIAL / LUMPECTOMY     OOPHORECTOMY     ROTATOR CUFF REPAIR     TOOTH EXTRACTION N/A 04/15/2022   Procedure: DENTAL RESTORATION/EXTRACTIONS;  Surgeon: Ascencion Lava, DMD;  Location: [facility] OR;  Service: Oral Surgery;  Laterality: N/A;   TOOTH EXTRACTION N/A 09/15/2023   Procedure: DENTAL RESTORATION/EXTRACTIONS;  Surgeon: Ascencion Lava, DMD;  Location: [facility] OR;  Service: Oral Surgery;  Laterality: N/A;    Current Outpatient Medications  Medication Sig Dispense Refill   ACCU-CHEK GUIDE test strip USE TO CHECK BLOOD GLUCOSE THREE TIMES DAILY, MORNING, AT NOON, AND AT BEDTIME 100 strip 0   Accu-Chek Softclix Lancets lancets USE TO TEST BLOOD SUGAR EVERY MORNING, AT NOON AND EVERY NIGHT AT BEDTIME 100 each 0   acyclovir  (ZOVIRAX ) 400 MG tablet TAKE 1 TABLET(400 MG) BY MOUTH THREE TIMES DAILY 30 tablet 3   albuterol  (PROVENTIL ) (2.5 MG/3ML) 0.083% nebulizer solution Take 3 mLs (2.5 mg total) by nebulization every 6 (six) hours as  needed for wheezing or shortness of breath. 75 mL 5   ASPERCREME LIDOCAINE  EX Apply 1 Application topically 3 (three) times daily as needed (pain).     cetirizine  (ZYRTEC ) 10 MG tablet Take 10 mg by mouth 2 (two) times daily.     Continuous Glucose Receiver (DEXCOM G7 RECEIVER) DEVI USE TO CHECK BLOOD GLUCOSE AS NEEDED 1 each 0   Continuous Glucose Sensor (DEXCOM G7 SENSOR) MISC USE TO CHECK BLOOD SUGAR THREE TIMES DAILY, BEFORE MEALS AND AT BEDTIME AND AS NEEDED. CHANGE SENSOR EVERY 10 DAYS 3 each 5   dicyclomine  (BENTYL ) 10 MG capsule Take 1 capsule (10 mg total) by mouth 4 (four) times daily -  before meals and at bedtime. Monitor for constipation, dry mouth, dizziness (Patient taking differently: Take 10 mg by  mouth 4 (four) times daily as needed (Bowel Spasm/Diarrhea). Monitor for constipation, dry mouth, dizziness) 120 capsule 3   diphenhydrAMINE  (BENADRYL ) 25 MG tablet Take 50 mg by mouth daily as needed for allergies or itching.     doxepin  (SINEQUAN ) 10 MG capsule TAKE 1 CAPSULE(10 MG) BY MOUTH AT BEDTIME 30 capsule 3   DULoxetine  (CYMBALTA ) 60 MG capsule TAKE 1 CAPSULE(60 MG) BY MOUTH TWICE DAILY 60 capsule 3   gabapentin  (NEURONTIN ) 800 MG tablet TAKE 1 TABLET(800 MG) BY MOUTH THREE TIMES DAILY 90 tablet 5   Glucagon  (GVOKE HYPOPEN  2-PACK) 1 MG/0.2ML SOAJ Inject 0.2 mLs into the skin as needed (Blood glucose < 53). 0.4 mL 5   Glycerin-Hypromellose-PEG 400 (DRY EYE RELIEF DROPS OP) Place 1 drop into both eyes 3 (three) times daily as needed (Dry eye).     HYDROcodone -acetaminophen  (NORCO) 10-325 MG tablet Take 1 tablet by mouth every 4 (four) hours as needed. Stop Hydrocodone  7.5/325mg  tablet. 180 tablet 0   HYDROcodone -acetaminophen  (NORCO/VICODIN) 5-325 MG tablet Take 1 tablet by mouth every 4 (four) hours as needed for moderate pain (pain score 4-6). 30 tablet 0   ipratropium (ATROVENT  HFA) 17 MCG/ACT inhaler Inhale 2 puffs into the lungs every 4 (four) hours as needed (COPD). 1 each 4    isosorbide  dinitrate (ISORDIL ) 30 MG tablet TAKE 1 TABLET(30 MG) BY MOUTH DAILY 90 tablet 1   JARDIANCE  10 MG TABS tablet TAKE 1 TABLET(10 MG) BY MOUTH DAILY BEFORE BREAKFAST 30 tablet 5   Lancets Misc. (ACCU-CHEK SOFTCLIX LANCET DEV) KIT USE TO CHECK GLUCOSE THREE TIMES DAILY 1 kit 0   LORazepam  (ATIVAN ) 0.5 MG tablet Take 1 tablet (0.5 mg total) by mouth 3 (three) times daily as needed for anxiety. 90 tablet 3   Magnesium  Hydroxide (MILK OF MAGNESIA PO) Take by mouth. Takes as needed     meclizine  (ANTIVERT ) 25 MG tablet TAKE 1 TABLET(25 MG) BY MOUTH THREE TIMES DAILY AS NEEDED FOR DIZZINESS 30 tablet 1   methocarbamol  (ROBAXIN ) 500 MG tablet TAKE 1 TABLET(500 MG) BY MOUTH EVERY 8 HOURS AS NEEDED FOR MUSCLE SPASMS 30 tablet 1   Misc. Devices KIT Henmnii rollator walker 1 kit 0   montelukast  (SINGULAIR ) 10 MG tablet TAKE 1 TABLET(10 MG) BY MOUTH AT BEDTIME 30 tablet 3   nitroGLYCERIN  (NITROSTAT ) 0.4 MG SL tablet Place 1 tablet (0.4 mg total) under the tongue every 5 (five) minutes as needed for chest pain. 90 tablet 3   omeprazole  (PRILOSEC) 40 MG capsule TAKE 1 CAPSULE(40 MG) BY MOUTH TWICE DAILY 180 capsule 0   ondansetron  (ZOFRAN -ODT) 8 MG disintegrating tablet DISSOLVE 1 TABLET EVERY 8 HOURS AS NEEDED FOR FOR NAUSEA AND VOMITING 257 tablet 1   STIOLTO RESPIMAT  2.5-2.5 MCG/ACT AERS INHALE 2 PUFFS INTO THE LUNGS DAILY 4 g 11   tirzepatide  (MOUNJARO ) 5 MG/0.5ML Pen Inject 5 mg into the skin once a week. 6 mL 1   No current facility-administered medications for this visit.    Allergies as of 11/28/2023 - Review Complete 11/28/2023  Allergen Reaction Noted   Sulfa antibiotics Shortness Of Breath, Swelling, and Rash 06/08/2011   Chicken allergy Diarrhea and Nausea And Vomiting 09/13/2023   Clindamycin /lincomycin Itching and Nausea And Vomiting 05/19/2022   Dilaudid  [hydromorphone  hcl]  10/31/2013   Ferrlecit [na ferric gluc cplx in sucrose] Itching 07/20/2023   Iron   08/31/2020    Metronidazole Diarrhea and Nausea And Vomiting 03/03/2015   Other  09/13/2023   Prednisone  Other (See Comments) 03/03/2015  Statins Other (See Comments) 10/07/2021   Tuna oil [fish oil] Diarrhea and Nausea And Vomiting 09/13/2023   Budesonide  Nausea And Vomiting and Rash 09/13/2023   Cephalexin Diarrhea and Nausea And Vomiting 03/03/2015   Methotrexate derivatives Swelling and Rash 06/08/2011   Morphine  and codeine Itching and Anxiety 11/07/2013   Tomato Itching and Rash 09/13/2023    Family History  Problem Relation Age of Onset   Lung cancer Mother    Cancer Mother    Miscarriages / [place] Mother    Varicose Veins Mother    Hypertension Mother    COPD Father    Lung cancer Father    Lymphoma Father    Alcohol abuse Father    Arthritis Father    Anxiety disorder Sister    Depression Brother    Anxiety disorder Sister    Intellectual disability Sister     Social History   Socioeconomic History   Marital status: Widowed    Spouse name: Not on file   Number of children: Not on file   Years of education: Not on file   Highest education level: Associate degree: occupational, Scientist, product/process development, or vocational program  Occupational History   Not on file  Tobacco Use   Smoking status: Former    Current packs/day: 0.00    Average packs/day: 1 pack/day for 49.0 years (49.0 ttl pk-yrs)    Types: Cigarettes    Start date: 08/08/1966    Quit date: 08/31/2005    Years since quitting: 18.2   Smokeless tobacco: Never  Vaping Use   Vaping status: Never Used  Substance and Sexual Activity   Alcohol use: No   Drug use: No    Comment: used marijuana in teens   Sexual activity: Not Currently    Birth control/protection: Surgical  Other Topics Concern   Not on file  Social History Narrative   Are you right handed or left handed? Left Handed    Are you currently employed ? Retired    What is your current occupation?   Do you live at home alone? Yes   Who lives with you?    What  type of home do you live in: 1 story or 2 story? Lives in a one story home        Social Drivers of Health   Financial Resource Strain: Medium Risk (08/10/2023)   Overall Financial Resource Strain (CARDIA)    Difficulty of Paying Living Expenses: Somewhat hard  Food Insecurity: Food Insecurity Present (08/10/2023)   Hunger Vital Sign    Worried About Running Out of Food in the Last Year: Often true    Ran Out of Food in the Last Year: Sometimes true  Transportation Needs: No Transportation Needs (08/10/2023)   PRAPARE - Administrator, Civil Service (Medical): No    Lack of Transportation (Non-Medical): No  Physical Activity: Insufficiently Active (08/10/2023)   Exercise Vital Sign    Days of Exercise per Week: 7 days    Minutes of Exercise per Session: 20 min  Stress: Stress Concern Present (08/10/2023)   [employer] of Occupational Health - Occupational Stress Questionnaire    Feeling of Stress : To some extent  Social Connections: Unknown (08/10/2023)   Social Connection and Isolation Panel [NHANES]    Frequency of Communication with Friends and Family: More than three times a week    Frequency of Social Gatherings with Friends and Family: Patient declined    Attends Religious Services: Patient declined  Active Member of Clubs or Organizations: Patient declined    Attends Banker Meetings: Never    Marital Status: Widowed  Intimate Partner Violence: Not At Risk (04/17/2023)   Humiliation, Afraid, Rape, and Kick questionnaire    Fear of Current or Ex-Partner: No    Emotionally Abused: No    Physically Abused: No    Sexually Abused: No     Review of Systems   Gen: Denies any fever, chills, fatigue, weight loss, lack of appetite.  CV: Denies chest pain, heart palpitations, peripheral edema, syncope.  Resp: Denies shortness of breath at rest or with exertion. Denies wheezing or cough.  GI: Denies dysphagia or odynophagia. Denies jaundice, hematemesis,  fecal incontinence. GU : Denies urinary burning, urinary frequency, urinary hesitancy MS: Denies joint pain, muscle weakness, cramps, or limitation of movement.  Derm: Denies rash, itching, dry skin Psych: Denies depression, anxiety, memory loss, and confusion Heme: Denies bruising, bleeding, and enlarged lymph nodes.   Physical Exam   BP (!) 117/57   Pulse 86   Temp 97.8 F (36.6 C) (Oral)   Ht 5\' 8"  (1.727 m)   Wt 158 lb 11.2 oz (72 kg)   BMI 24.13 kg/m  General:   Alert and oriented. Pleasant and cooperative. Well-nourished and well-developed.  Head:  Normocephalic and atraumatic. Eyes:  Without icterus Cardiac: S1 S2 present without murmurs Lungs: Clear bilaterally, 3 liters o2 Abdomen:  +BS, soft, non-tender and non-distended. No HSM noted. No guarding or rebound. No masses appreciated.  Rectal:  Deferred  Msk:  Symmetrical without gross deformities. Normal posture. Extremities:  Without edema. Neurologic:  Alert and  oriented x4;  grossly normal neurologically. Skin:  Intact without significant lesions or rashes. Psych:  Alert and cooperative. Normal mood and affect.   Assessment   Kayla Ryan is a 68 y.o. female presenting today with a history of COPD on oxygen , dermatomyositis, GERD, questionable Crohn's disease in the past but 2022 EGD/colonoscopy without Crohns, capsule in 2023 unrevealing, IDA requiring transfusions in the past, chronic diarrhea, last seen virtually in 2023, now with dysphagia.   Dysphagia: solid food, regurgitation, no odynophagia. Last EGD 2022 with empiric dilation and good results. Possible question of dark stools but does not appear to be melena. Hgb 13.5. Will pursue EGD/dilation.  Chronic diarrhea:  Notes remote history of Crohn's diagnosed at [facility] (records not available) but endoscopy/colonoscopy and capsule without evidence for this. Will check celiac serologies. May need CTE in future for further dedicated imaging of small bowel.  Course of Xifaxan  ordered. Dicyclomine  overshoots the mark with trend towards constipation.   Elevated transaminases: fluctuating, mild elevation. Update HFP. Celiac serologies ordered. No iron  overload. Hep C antibody negative in Oct 2023.       PLAN    Proceed with upper endoscopy/dilation  by Dr. Mordechai April in near future: the risks, benefits, and alternatives have been discussed with the patient in detail. The patient states understanding and desires to proceed.  Celiac serologies. HFP, may need further evaluation and US  abdomen ?CTE if persistent diarrhea Xifaxan  course 3 month follow-up regardless   Delman Ferns, PhD, ANP-BC [facility]

## 2023-11-28 NOTE — Telephone Encounter (Signed)
 Seen today and requested refill on dicyclomine .   [facility]

## 2023-11-28 NOTE — Telephone Encounter (Signed)
 Completed.

## 2023-11-28 NOTE — Progress Notes (Signed)
 Gastroenterology Office Note     Primary Care Physician:  Meldon Sport, MD  Primary Gastroenterologist: Dr Mordechai April   Chief Complaint   Chief Complaint  Patient presents with   Gastroesophageal Reflux    Having reflux, nausea coughing when swallowing every time and sometimes choking. Takes zofran  8 mg for nausea and it helps. Taking prilosec twice a day.      History of Present Illness   Kayla Ryan is a 68 y.o. female presenting today with a history of  COPD, dermatomyositis, GERD, questionable Crohn's disease in the past but 2022 EGD/colonoscopy without Crohns, capsule in 2023 unrevealing, IDA requiring transfusions in the past, chronic diarrhea, last seen virtually in 2023.    Got sick in October with sinus infections, had 3 rounds of abx, finally better end of Feb/March. Since that time, coughing after liquid and soft foods. Also notes solid food dysphagia and regurgitates. Has choked twice in the past month: chicken. Green beans. No odynophagia.Got denures one week ago and had modified diet with soft, puree food. Even smoothie can cough and choke.   Thyroid  scan upcoming. Has nodules in right side of neck. Enlarged area of right neck.   Having hunger pains after eating. Nausea, Having very dark stools like coffee colored. Not daily but at least two times a week.   Recent Hgb 13.5, ferrritin 99. Ferritin was 5 in Aug 2024. Iron  infusions in December. Does not take oral iron . Allergic to oral and IV iron  and has to be premedicated. No NSAIDs.    Diarrhea every day, maybe once a week will feel constipated watery stool about 4 times a day, as much as 8-10. Taking Bentyl  just as needed, if goes more than 4 times a day. Will get constipated with dicyclomine . She wonders if may be baseline constipated.    Chronic diarrhea: sometimes willl have one day of no BM and takes MOM as feels miserable. Metamucil made her bloated. Well balanced diet with lots of fiber. Bloating after  each meal.   3 liters O2 continously. Stable at baseline.    Previous pertinent work up:   Colonoscopy September 2022: -Diverticulosis in the sigmoid colon -Redundant and elongated colon  Remote hx of polyps (Dr Merlyn Starring over 5 years ago).    EGD September 2022: - Normal esophagus. Status post esophageal dilation - Normal stomach. - Normal duodenal bulb, second portion of the duodenum and third portion of the duodenum.  Capsule Feb 2023: few stomach erosions, 3 AVMs, no signs of IBD.    Diagnosed with Crohn's at [facility]. Records not available.  Niece with Crohn's   Father: mesenteric ischemia, passed   Past Medical History:  Diagnosis Date   Allergy Unknown   Anemia    Anxiety 1883   Arthritis    Asthma    CHF (congestive heart failure) (HCC) 2021   Chronic bronchitis    Crohn disease (HCC)    Emphysema    Emphysema of lung (HCC) 2003   Fibromyalgia    GERD (gastroesophageal reflux disease)    GI bleed    Herpes    History of blood transfusion    Hyperlipidemia    Hypertension 2015   IBS (irritable bowel syndrome)    Kidney failure    Migraines    Muscular dystrophy (HCC)    Neck pain    Oxygen  deficiency 2021   PAF (paroxysmal atrial fibrillation) (HCC)    Not anticoagulated with history of severe GI bleeding  Plantar fasciitis    Polymyositis (HCC)    PONV (postoperative nausea and vomiting)    Right thyroid  nodule 04/07/2022   Scoliosis    Type 2 diabetes mellitus (HCC)     Past Surgical History:  Procedure Laterality Date   ABDOMINAL HYSTERECTOMY     AGILE CAPSULE N/A 06/24/2021   Procedure: AGILE CAPSULE;  Surgeon: Vinetta Greening, DO;  [facility];  Service: Endoscopy;  Laterality: N/A;   AGILE CAPSULE N/A 07/22/2021   Procedure: AGILE CAPSULE;  Surgeon: Suzette Espy, MD;  [facility]: [facility];  Service: Endoscopy;  Laterality: N/A;  7:30am   bone spur     BREAST SURGERY  1995   CHOLECYSTECTOMY     COLONOSCOPY WITH  PROPOFOL  N/A 04/30/2021   Procedure: COLONOSCOPY WITH PROPOFOL ;  Surgeon: Suzette Espy, MD;  [facility]: [facility];  Service: Endoscopy;  Laterality: N/A;   ESOPHAGEAL DILATION N/A 04/30/2021   Procedure: ESOPHAGEAL DILATION;  Surgeon: Suzette Espy, MD;  [facility]: [facility];  Service: Endoscopy;  Laterality: N/A;   ESOPHAGOGASTRODUODENOSCOPY (EGD) WITH PROPOFOL  N/A 04/30/2021   Procedure: ESOPHAGOGASTRODUODENOSCOPY (EGD) WITH PROPOFOL ;  Surgeon: Suzette Espy, MD;  [facility]: [facility];  Service: Endoscopy;  Laterality: N/A;   GIVENS CAPSULE STUDY N/A 09/22/2021   Procedure: GIVENS CAPSULE STUDY;  Surgeon: Suzette Espy, MD;  [facility]: [facility];  Service: Endoscopy;  Laterality: N/A;  7:30am   HERNIA REPAIR     LEFT HEART CATHETERIZATION WITH CORONARY ANGIOGRAM N/A 11/07/2013   Procedure: LEFT HEART CATHETERIZATION WITH CORONARY ANGIOGRAM;  Surgeon: Avanell Leigh, MD;  Location: [facility] CATH LAB;  Service: Cardiovascular;  Laterality: N/A;   MASTECTOMY PARTIAL / LUMPECTOMY     OOPHORECTOMY     ROTATOR CUFF REPAIR     TOOTH EXTRACTION N/A 04/15/2022   Procedure: DENTAL RESTORATION/EXTRACTIONS;  Surgeon: Ascencion Lava, DMD;  Location: [facility] OR;  Service: Oral Surgery;  Laterality: N/A;   TOOTH EXTRACTION N/A 09/15/2023   Procedure: DENTAL RESTORATION/EXTRACTIONS;  Surgeon: Ascencion Lava, DMD;  Location: [facility] OR;  Service: Oral Surgery;  Laterality: N/A;    Current Outpatient Medications  Medication Sig Dispense Refill   ACCU-CHEK GUIDE test strip USE TO CHECK BLOOD GLUCOSE THREE TIMES DAILY, MORNING, AT NOON, AND AT BEDTIME 100 strip 0   Accu-Chek Softclix Lancets lancets USE TO TEST BLOOD SUGAR EVERY MORNING, AT NOON AND EVERY NIGHT AT BEDTIME 100 each 0   acyclovir  (ZOVIRAX ) 400 MG tablet TAKE 1 TABLET(400 MG) BY MOUTH THREE TIMES DAILY 30 tablet 3   albuterol  (PROVENTIL ) (2.5 MG/3ML) 0.083% nebulizer solution Take 3 mLs (2.5 mg total) by nebulization every 6 (six) hours as  needed for wheezing or shortness of breath. 75 mL 5   ASPERCREME LIDOCAINE  EX Apply 1 Application topically 3 (three) times daily as needed (pain).     cetirizine  (ZYRTEC ) 10 MG tablet Take 10 mg by mouth 2 (two) times daily.     Continuous Glucose Receiver (DEXCOM G7 RECEIVER) DEVI USE TO CHECK BLOOD GLUCOSE AS NEEDED 1 each 0   Continuous Glucose Sensor (DEXCOM G7 SENSOR) MISC USE TO CHECK BLOOD SUGAR THREE TIMES DAILY, BEFORE MEALS AND AT BEDTIME AND AS NEEDED. CHANGE SENSOR EVERY 10 DAYS 3 each 5   dicyclomine  (BENTYL ) 10 MG capsule Take 1 capsule (10 mg total) by mouth 4 (four) times daily -  before meals and at bedtime. Monitor for constipation, dry mouth, dizziness (Patient taking differently: Take 10 mg by  mouth 4 (four) times daily as needed (Bowel Spasm/Diarrhea). Monitor for constipation, dry mouth, dizziness) 120 capsule 3   diphenhydrAMINE  (BENADRYL ) 25 MG tablet Take 50 mg by mouth daily as needed for allergies or itching.     doxepin  (SINEQUAN ) 10 MG capsule TAKE 1 CAPSULE(10 MG) BY MOUTH AT BEDTIME 30 capsule 3   DULoxetine  (CYMBALTA ) 60 MG capsule TAKE 1 CAPSULE(60 MG) BY MOUTH TWICE DAILY 60 capsule 3   gabapentin  (NEURONTIN ) 800 MG tablet TAKE 1 TABLET(800 MG) BY MOUTH THREE TIMES DAILY 90 tablet 5   Glucagon  (GVOKE HYPOPEN  2-PACK) 1 MG/0.2ML SOAJ Inject 0.2 mLs into the skin as needed (Blood glucose < 53). 0.4 mL 5   Glycerin-Hypromellose-PEG 400 (DRY EYE RELIEF DROPS OP) Place 1 drop into both eyes 3 (three) times daily as needed (Dry eye).     HYDROcodone -acetaminophen  (NORCO) 10-325 MG tablet Take 1 tablet by mouth every 4 (four) hours as needed. Stop Hydrocodone  7.5/325mg  tablet. 180 tablet 0   HYDROcodone -acetaminophen  (NORCO/VICODIN) 5-325 MG tablet Take 1 tablet by mouth every 4 (four) hours as needed for moderate pain (pain score 4-6). 30 tablet 0   ipratropium (ATROVENT  HFA) 17 MCG/ACT inhaler Inhale 2 puffs into the lungs every 4 (four) hours as needed (COPD). 1 each 4    isosorbide  dinitrate (ISORDIL ) 30 MG tablet TAKE 1 TABLET(30 MG) BY MOUTH DAILY 90 tablet 1   JARDIANCE  10 MG TABS tablet TAKE 1 TABLET(10 MG) BY MOUTH DAILY BEFORE BREAKFAST 30 tablet 5   Lancets Misc. (ACCU-CHEK SOFTCLIX LANCET DEV) KIT USE TO CHECK GLUCOSE THREE TIMES DAILY 1 kit 0   LORazepam  (ATIVAN ) 0.5 MG tablet Take 1 tablet (0.5 mg total) by mouth 3 (three) times daily as needed for anxiety. 90 tablet 3   Magnesium  Hydroxide (MILK OF MAGNESIA PO) Take by mouth. Takes as needed     meclizine  (ANTIVERT ) 25 MG tablet TAKE 1 TABLET(25 MG) BY MOUTH THREE TIMES DAILY AS NEEDED FOR DIZZINESS 30 tablet 1   methocarbamol  (ROBAXIN ) 500 MG tablet TAKE 1 TABLET(500 MG) BY MOUTH EVERY 8 HOURS AS NEEDED FOR MUSCLE SPASMS 30 tablet 1   Misc. Devices KIT Henmnii rollator walker 1 kit 0   montelukast  (SINGULAIR ) 10 MG tablet TAKE 1 TABLET(10 MG) BY MOUTH AT BEDTIME 30 tablet 3   nitroGLYCERIN  (NITROSTAT ) 0.4 MG SL tablet Place 1 tablet (0.4 mg total) under the tongue every 5 (five) minutes as needed for chest pain. 90 tablet 3   omeprazole  (PRILOSEC) 40 MG capsule TAKE 1 CAPSULE(40 MG) BY MOUTH TWICE DAILY 180 capsule 0   ondansetron  (ZOFRAN -ODT) 8 MG disintegrating tablet DISSOLVE 1 TABLET EVERY 8 HOURS AS NEEDED FOR FOR NAUSEA AND VOMITING 257 tablet 1   STIOLTO RESPIMAT  2.5-2.5 MCG/ACT AERS INHALE 2 PUFFS INTO THE LUNGS DAILY 4 g 11   tirzepatide  (MOUNJARO ) 5 MG/0.5ML Pen Inject 5 mg into the skin once a week. 6 mL 1   No current facility-administered medications for this visit.    Allergies as of 11/28/2023 - Review Complete 11/28/2023  Allergen Reaction Noted   Sulfa antibiotics Shortness Of Breath, Swelling, and Rash 06/08/2011   Chicken allergy Diarrhea and Nausea And Vomiting 09/13/2023   Clindamycin /lincomycin Itching and Nausea And Vomiting 05/19/2022   Dilaudid  [hydromorphone  hcl]  10/31/2013   Ferrlecit [na ferric gluc cplx in sucrose] Itching 07/20/2023   Iron   08/31/2020    Metronidazole Diarrhea and Nausea And Vomiting 03/03/2015   Other  09/13/2023   Prednisone  Other (See Comments) 03/03/2015  Statins Other (See Comments) 10/07/2021   Tuna oil [fish oil] Diarrhea and Nausea And Vomiting 09/13/2023   Budesonide  Nausea And Vomiting and Rash 09/13/2023   Cephalexin Diarrhea and Nausea And Vomiting 03/03/2015   Methotrexate derivatives Swelling and Rash 06/08/2011   Morphine  and codeine Itching and Anxiety 11/07/2013   Tomato Itching and Rash 09/13/2023    Family History  Problem Relation Age of Onset   Lung cancer Mother    Cancer Mother    Miscarriages / [place] Mother    Varicose Veins Mother    Hypertension Mother    COPD Father    Lung cancer Father    Lymphoma Father    Alcohol abuse Father    Arthritis Father    Anxiety disorder Sister    Depression Brother    Anxiety disorder Sister    Intellectual disability Sister     Social History   Socioeconomic History   Marital status: Widowed    Spouse name: Not on file   Number of children: Not on file   Years of education: Not on file   Highest education level: Associate degree: occupational, Scientist, product/process development, or vocational program  Occupational History   Not on file  Tobacco Use   Smoking status: Former    Current packs/day: 0.00    Average packs/day: 1 pack/day for 49.0 years (49.0 ttl pk-yrs)    Types: Cigarettes    Start date: 08/08/1966    Quit date: 08/31/2005    Years since quitting: 18.2   Smokeless tobacco: Never  Vaping Use   Vaping status: Never Used  Substance and Sexual Activity   Alcohol use: No   Drug use: No    Comment: used marijuana in teens   Sexual activity: Not Currently    Birth control/protection: Surgical  Other Topics Concern   Not on file  Social History Narrative   Are you right handed or left handed? Left Handed    Are you currently employed ? Retired    What is your current occupation?   Do you live at home alone? Yes   Who lives with you?    What  type of home do you live in: 1 story or 2 story? Lives in a one story home        Social Drivers of Health   Financial Resource Strain: Medium Risk (08/10/2023)   Overall Financial Resource Strain (CARDIA)    Difficulty of Paying Living Expenses: Somewhat hard  Food Insecurity: Food Insecurity Present (08/10/2023)   Hunger Vital Sign    Worried About Running Out of Food in the Last Year: Often true    Ran Out of Food in the Last Year: Sometimes true  Transportation Needs: No Transportation Needs (08/10/2023)   PRAPARE - Administrator, Civil Service (Medical): No    Lack of Transportation (Non-Medical): No  Physical Activity: Insufficiently Active (08/10/2023)   Exercise Vital Sign    Days of Exercise per Week: 7 days    Minutes of Exercise per Session: 20 min  Stress: Stress Concern Present (08/10/2023)   [employer] of Occupational Health - Occupational Stress Questionnaire    Feeling of Stress : To some extent  Social Connections: Unknown (08/10/2023)   Social Connection and Isolation Panel [NHANES]    Frequency of Communication with Friends and Family: More than three times a week    Frequency of Social Gatherings with Friends and Family: Patient declined    Attends Religious Services: Patient declined  Active Member of Clubs or Organizations: Patient declined    Attends Banker Meetings: Never    Marital Status: Widowed  Intimate Partner Violence: Not At Risk (04/17/2023)   Humiliation, Afraid, Rape, and Kick questionnaire    Fear of Current or Ex-Partner: No    Emotionally Abused: No    Physically Abused: No    Sexually Abused: No     Review of Systems   Gen: Denies any fever, chills, fatigue, weight loss, lack of appetite.  CV: Denies chest pain, heart palpitations, peripheral edema, syncope.  Resp: Denies shortness of breath at rest or with exertion. Denies wheezing or cough.  GI: Denies dysphagia or odynophagia. Denies jaundice, hematemesis,  fecal incontinence. GU : Denies urinary burning, urinary frequency, urinary hesitancy MS: Denies joint pain, muscle weakness, cramps, or limitation of movement.  Derm: Denies rash, itching, dry skin Psych: Denies depression, anxiety, memory loss, and confusion Heme: Denies bruising, bleeding, and enlarged lymph nodes.   Physical Exam   BP (!) 117/57   Pulse 86   Temp 97.8 F (36.6 C) (Oral)   Ht 5\' 8"  (1.727 m)   Wt 158 lb 11.2 oz (72 kg)   BMI 24.13 kg/m  General:   Alert and oriented. Pleasant and cooperative. Well-nourished and well-developed.  Head:  Normocephalic and atraumatic. Eyes:  Without icterus Cardiac: S1 S2 present without murmurs Lungs: Clear bilaterally, 3 liters o2 Abdomen:  +BS, soft, non-tender and non-distended. No HSM noted. No guarding or rebound. No masses appreciated.  Rectal:  Deferred  Msk:  Symmetrical without gross deformities. Normal posture. Extremities:  Without edema. Neurologic:  Alert and  oriented x4;  grossly normal neurologically. Skin:  Intact without significant lesions or rashes. Psych:  Alert and cooperative. Normal mood and affect.   Assessment   Kayla Ryan is a 68 y.o. female presenting today with a history of COPD on oxygen , dermatomyositis, GERD, questionable Crohn's disease in the past but 2022 EGD/colonoscopy without Crohns, capsule in 2023 unrevealing, IDA requiring transfusions in the past, chronic diarrhea, last seen virtually in 2023, now with dysphagia.   Dysphagia: solid food, regurgitation, no odynophagia. Last EGD 2022 with empiric dilation and good results. Possible question of dark stools but does not appear to be melena. Hgb 13.5. Will pursue EGD/dilation.  Chronic diarrhea:  Notes remote history of Crohn's diagnosed at [facility] (records not available) but endoscopy/colonoscopy and capsule without evidence for this. Will check celiac serologies. May need CTE in future for further dedicated imaging of small bowel.  Course of Xifaxan  ordered. Dicyclomine  overshoots the mark with trend towards constipation.   Elevated transaminases: fluctuating, mild elevation. Update HFP. Celiac serologies ordered. No iron  overload. Hep C antibody negative in Oct 2023.       PLAN    Proceed with upper endoscopy/dilation  by Dr. Mordechai April in near future: the risks, benefits, and alternatives have been discussed with the patient in detail. The patient states understanding and desires to proceed.  Celiac serologies. HFP, may need further evaluation and US  abdomen ?CTE if persistent diarrhea Xifaxan  course 3 month follow-up regardless   Delman Ferns, PhD, ANP-BC [facility]

## 2023-11-29 ENCOUNTER — Encounter: Payer: Self-pay | Admitting: *Deleted

## 2023-11-29 ENCOUNTER — Telehealth: Payer: Self-pay

## 2023-11-29 LAB — HEPATIC FUNCTION PANEL
ALT: 112 IU/L — ABNORMAL HIGH (ref 0–32)
AST: 79 IU/L — ABNORMAL HIGH (ref 0–40)
Albumin: 4.7 g/dL (ref 3.9–4.9)
Alkaline Phosphatase: 151 IU/L — ABNORMAL HIGH (ref 44–121)
Bilirubin Total: 0.3 mg/dL (ref 0.0–1.2)
Bilirubin, Direct: 0.12 mg/dL (ref 0.00–0.40)
Total Protein: 7.2 g/dL (ref 6.0–8.5)

## 2023-11-29 LAB — TISSUE TRANSGLUTAMINASE, IGA: Transglutaminase IgA: <2 U/mL (ref 0–3)

## 2023-11-29 LAB — IGA: IgA/Immunoglobulin A, Serum: 140 mg/dL (ref 87–352)

## 2023-11-29 NOTE — Telephone Encounter (Signed)
 PA done on Cover My Meds for Xifaxan  550mg  tabs. Tried/ failed: Dicyclomine . Dx: SIBO and R19.7 (Diarrhea). Waiting on a response from Cover My Meds.

## 2023-11-29 NOTE — Telephone Encounter (Signed)
Closing encounter.  Nothing further needed.

## 2023-11-30 NOTE — Telephone Encounter (Signed)
 Pt approved for Xifaxan  tab 550mg  through 08/07/2024. Scanned to chart and pt made aware.

## 2023-12-04 ENCOUNTER — Other Ambulatory Visit: Payer: Self-pay | Admitting: Internal Medicine

## 2023-12-04 ENCOUNTER — Telehealth: Payer: Self-pay | Admitting: Internal Medicine

## 2023-12-04 ENCOUNTER — Ambulatory Visit: Admitting: Cardiology

## 2023-12-04 NOTE — Telephone Encounter (Signed)
 Sent in today.  Copied from CRM [id number]. Topic: Clinical - Prescription Issue >> Dec 04, 2023 10:57 AM Everlene Hobby D wrote: Patient is totally out of medication- omeprazole  (PRILOSEC) 40 MG capsule and says it wasn't called in last week she says she would like it called in today. She really needs it for her acid reflux and says it's been more than a week and nothing.   [id number] - [place], [place] - [address] [place] [address] Phone: [phone] Fax: [phone] Hours: Not open 24 hours

## 2023-12-05 NOTE — Telephone Encounter (Signed)
 Medication was refilled on 04/28

## 2023-12-06 DIAGNOSIS — J439 Emphysema, unspecified: Secondary | ICD-10-CM | POA: Diagnosis not present

## 2023-12-06 DIAGNOSIS — J449 Chronic obstructive pulmonary disease, unspecified: Secondary | ICD-10-CM | POA: Diagnosis not present

## 2023-12-06 NOTE — Patient Instructions (Signed)
 Kayla Ryan  12/06/2023     @PREFPERIOPPHARMACY @   Your procedure is scheduled on 5/8.  Report to [facility] at 0715 A.M.  Call this number if you have problems the morning of surgery:  [phone]  If you experience any cold or flu symptoms such as cough, fever, chills, shortness of breath, etc. between now and your scheduled surgery, please notify us  at the above number.   Remember:  Do not eat or drink after midnight.  You may drink clear liquids until 0515 .  Clear liquids allowed are:                    Water, Juice (No red color; non-citric and without pulp; diabetics please choose diet or no sugar options), Carbonated beverages (diabetics please choose diet or no sugar options), Clear Tea (No creamer, milk, or cream, including half & half and powdered creamer), and Black Coffee Only (No creamer, milk or cream, including half & half and powdered creamer)    Take these medicines the morning of surgery with A SIP OF WATER albuterol , zyrtec , cymbalta , gabapentin , norco if needed, atrovent , isordil , singulair , prilosec, and ativan , robaxin , and zofran  if needed, Bring your inhaler with you to the hospital. Make sure you hold your Jardiance  for 3 days before your procedure and mounjaro  for 1 week before your procedure.    Do not wear jewelry, make-up or nail polish, including gel polish,  artificial nails, or any other type of covering on natural nails (fingers and  toes).  Do not wear lotions, powders, or perfumes, or deodorant.  Do not shave 48 hours prior to surgery.  Men may shave face and neck.  Do not bring valuables to the hospital.  [facility] is not responsible for any belongings or valuables.  Contacts, dentures or bridgework may not be worn into surgery.  Leave your suitcase in the car.  After surgery it may be brought to your room.  For patients admitted to the hospital, discharge time will be determined by your treatment team.  Patients discharged the day of  surgery will not be allowed to drive home.   Name and phone number of your driver:   driver Special instructions:  Please follow diet and prep instructions given to you at Dr. Cherisse Cornell office.  Please read over the following fact sheets that you were given. Anesthesia Post-op Instructions and Care and Recovery After Surgery      Monitored Anesthesia Care, Care After The following information offers guidance on how to care for yourself after your procedure. Your health care provider may also give you more specific instructions. If you have problems or questions, contact your health care provider. What can I expect after the procedure? After the procedure, it is common to have: Tiredness. Little or no memory about what happened during or after the procedure. Impaired judgment when it comes to making decisions. Nausea or vomiting. Some trouble with balance. Follow these instructions at home: For the time period you were told by your health care provider:  Rest. Do not participate in activities where you could fall or become injured. Do not drive or use machinery. Do not drink alcohol. Do not take sleeping pills or medicines that cause drowsiness. Do not make important decisions or sign legal documents. Do not take care of children on your own. Medicines Take over-the-counter and prescription medicines only as told by your health care provider. If you were prescribed antibiotics, take them as told by  your health care provider. Do not stop using the antibiotic even if you start to feel better. Eating and drinking Follow instructions from your health care provider about what you may eat and drink. Drink enough fluid to keep your urine pale yellow. If you vomit: Drink clear fluids slowly and in small amounts as you are able. Clear fluids include water, ice chips, low-calorie sports drinks, and fruit juice that has water added to it (diluted fruit juice). Eat light and bland foods in  small amounts as you are able. These foods include bananas, applesauce, rice, lean meats, toast, and crackers. General instructions  Have a responsible adult stay with you for the time you are told. It is important to have someone help care for you until you are awake and alert. If you have sleep apnea, surgery and some medicines can increase your risk for breathing problems. Follow instructions from your health care provider about wearing your sleep device: When you are sleeping. This includes during daytime naps. While taking prescription pain medicines, sleeping medicines, or medicines that make you drowsy. Do not use any products that contain nicotine or tobacco. These products include cigarettes, chewing tobacco, and vaping devices, such as e-cigarettes. If you need help quitting, ask your health care provider. Contact a health care provider if: You feel nauseous or vomit every time you eat or drink. You feel light-headed. You are still sleepy or having trouble with balance after 24 hours. You get a rash. You have a fever. You have redness or swelling around the IV site. Get help right away if: You have trouble breathing. You have new confusion after you get home. These symptoms may be an emergency. Get help right away. Call 911. Do not wait to see if the symptoms will go away. Do not drive yourself to the hospital. This information is not intended to replace advice given to you by your health care provider. Make sure you discuss any questions you have with your health care provider. Document Revised: 12/20/2021 Document Reviewed: 12/20/2021 Elsevier Patient Education  2024 [employer]Upper Endoscopy, Adult, Care After After the procedure, it is common to have a sore throat. It is also common to have: Mild stomach pain or discomfort. Bloating. Nausea. Follow these instructions at home: The instructions below may help you care for yourself at home. Your health care provider may  give you more instructions. If you have questions, ask your health care provider. If you were given a sedative during the procedure, it can affect you for several hours. Do not drive or operate machinery until your health care provider says that it is safe. If you will be going home right after the procedure, plan to have a responsible adult: Take you home from the hospital or clinic. You will not be allowed to drive. Care for you for the time you are told. Follow instructions from your health care provider about what you may eat and drink. Return to your normal activities as told by your health care provider. Ask your health care provider what activities are safe for you. Take over-the-counter and prescription medicines only as told by your health care provider. Contact a health care provider if you: Have a sore throat that lasts longer than one day. Have trouble swallowing. Have a fever. Get help right away if you: Vomit blood or your vomit looks like coffee grounds. Have bloody, black, or tarry stools. Have a very bad sore throat or you cannot swallow. Have difficulty breathing or very bad  pain in your chest or abdomen. These symptoms may be an emergency. Get help right away. Call 911. Do not wait to see if the symptoms will go away. Do not drive yourself to the hospital. Summary After the procedure, it is common to have a sore throat, mild stomach discomfort, bloating, and nausea. If you were given a sedative during the procedure, it can affect you for several hours. Do not drive until your health care provider says that it is safe. Follow instructions from your health care provider about what you may eat and drink. Return to your normal activities as told by your health care provider. This information is not intended to replace advice given to you by your health care provider. Make sure you discuss any questions you have with your health care provider. Document Revised: 11/03/2021  Document Reviewed: 11/03/2021 Elsevier Patient Education  2024 [employer]Upper Endoscopy, Adult Upper endoscopy is a procedure to look inside the upper GI (gastrointestinal) tract. The upper GI tract is made up of: The esophagus. This is the part of the body that moves food from your mouth to your stomach. The stomach. The duodenum. This is the first part of your small intestine. This procedure is also called esophagogastroduodenoscopy (EGD) or gastroscopy. In this procedure, your health care provider passes a thin, flexible tube (endoscope) through your mouth and down your esophagus into your stomach and into your duodenum. A small camera is attached to the end of the tube. Images from the camera appear on a monitor in the exam room. During this procedure, your health care provider may also remove a small piece of tissue to be sent to a lab and examined under a microscope (biopsy). Your health care provider may do an upper endoscopy to diagnose cancers of the upper GI tract. You may also have this procedure to find the cause of other conditions, such as: Stomach pain. Heartburn. Pain or problems when swallowing. Nausea and vomiting. Stomach bleeding. Stomach ulcers. Tell a health care provider about: Any allergies you have. All medicines you are taking, including vitamins, herbs, eye drops, creams, and over-the-counter medicines. Any problems you or family members have had with anesthetic medicines. Any bleeding problems you have. Any surgeries you have had. Any medical conditions you have. Whether you are pregnant or may be pregnant. What are the risks? Your healthcare provider will talk with you about risks. These may include: Infection. Bleeding. Allergic reactions to medicines. A tear or hole (perforation) in the esophagus, stomach, or duodenum. What happens before the procedure? When to stop eating and drinking Follow instructions from your health care provider about what you  may eat and drink. These may include: 8 hours before your procedure Stop eating most foods. Do not eat meat, fried foods, or fatty foods. Eat only light foods, such as toast or crackers. All liquids are okay except energy drinks and alcohol. 6 hours before your procedure Stop eating. Drink only clear liquids, such as water, clear fruit juice, black coffee, plain tea, and sports drinks. Do not drink energy drinks or alcohol. 2 hours before your procedure Stop drinking all liquids. You may be allowed to take medicines with small sips of water. If you do not follow your health care provider's instructions, your procedure may be delayed or canceled. Medicines Ask your health care provider about: Changing or stopping your regular medicines. This is especially important if you are taking diabetes medicines or blood thinners. Taking medicines such as aspirin  and ibuprofen. These medicines can  thin your blood. Do not take these medicines unless your health care provider tells you to take them. Taking over-the-counter medicines, vitamins, herbs, and supplements. General instructions If you will be going home right after the procedure, plan to have a responsible adult: Take you home from the hospital or clinic. You will not be allowed to drive. Care for you for the time you are told. What happens during the procedure?  An IV will be inserted into one of your veins. You may be given one or more of the following: A medicine to help you relax (sedative). A medicine to numb the throat (local anesthetic). You will lie on your left side on an exam table. Your health care provider will pass the endoscope through your mouth and down your esophagus. Your health care provider will use the scope to check the inside of your esophagus, stomach, and duodenum. Biopsies may be taken. The endoscope will be removed. The procedure may vary among health care providers and hospitals. What happens after the  procedure? Your blood pressure, heart rate, breathing rate, and blood oxygen  level will be monitored until you leave the hospital or clinic. When your throat is no longer numb, you may be given some fluids to drink. If you were given a sedative during the procedure, it can affect you for several hours. Do not drive or operate machinery until your health care provider says that it is safe. It is up to you to get the results of your procedure. Ask your health care provider, or the department that is doing the procedure, when your results will be ready. Contact a health care provider if you: Have a sore throat that lasts longer than 1 day. Have a fever. Get help right away if you: Vomit blood or your vomit looks like coffee grounds. Have bloody, black, or tarry stools. Have a very bad sore throat or you cannot swallow. Have difficulty breathing or very bad pain in your chest or abdomen. These symptoms may be an emergency. Get help right away. Call 911. Do not wait to see if the symptoms will go away. Do not drive yourself to the hospital. Summary Upper endoscopy is a procedure to look inside the upper GI tract. During the procedure, an IV will be inserted into one of your veins. You may be given a medicine to help you relax. The endoscope will be passed through your mouth and down your esophagus. Follow instructions from your health care provider about what you can eat and drink. This information is not intended to replace advice given to you by your health care provider. Make sure you discuss any questions you have with your health care provider. Document Revised: 11/03/2021 Document Reviewed: 11/03/2021 Elsevier Patient Education  2024 [employer].

## 2023-12-07 ENCOUNTER — Encounter (HOSPITAL_COMMUNITY)
Admission: RE | Admit: 2023-12-07 | Discharge: 2023-12-07 | Disposition: A | Discharge status: Home / Self Care | Source: Ambulatory Visit | Attending: Internal Medicine | Admitting: Internal Medicine

## 2023-12-11 ENCOUNTER — Encounter

## 2023-12-12 ENCOUNTER — Emergency Department (HOSPITAL_COMMUNITY)
Admission: EM | Admit: 2023-12-12 | Discharge: 2023-12-12 | Disposition: A | Discharge status: Home / Self Care | Attending: Emergency Medicine | Admitting: Emergency Medicine

## 2023-12-12 ENCOUNTER — Other Ambulatory Visit: Payer: Self-pay

## 2023-12-12 ENCOUNTER — Encounter (HOSPITAL_COMMUNITY)
Admission: RE | Admit: 2023-12-12 | Discharge: 2023-12-12 | Disposition: A | Discharge status: Home / Self Care | Source: Ambulatory Visit | Attending: Internal Medicine | Admitting: Internal Medicine

## 2023-12-12 ENCOUNTER — Emergency Department (HOSPITAL_COMMUNITY)

## 2023-12-12 ENCOUNTER — Encounter (HOSPITAL_COMMUNITY): Payer: Self-pay

## 2023-12-12 DIAGNOSIS — J45909 Unspecified asthma, uncomplicated: Secondary | ICD-10-CM | POA: Diagnosis not present

## 2023-12-12 DIAGNOSIS — J4489 Other specified chronic obstructive pulmonary disease: Secondary | ICD-10-CM | POA: Insufficient documentation

## 2023-12-12 DIAGNOSIS — R739 Hyperglycemia, unspecified: Secondary | ICD-10-CM

## 2023-12-12 DIAGNOSIS — I110 Hypertensive heart disease with heart failure: Secondary | ICD-10-CM | POA: Diagnosis not present

## 2023-12-12 DIAGNOSIS — R0789 Other chest pain: Secondary | ICD-10-CM | POA: Insufficient documentation

## 2023-12-12 DIAGNOSIS — E1165 Type 2 diabetes mellitus with hyperglycemia: Secondary | ICD-10-CM | POA: Diagnosis not present

## 2023-12-12 DIAGNOSIS — I509 Heart failure, unspecified: Secondary | ICD-10-CM | POA: Diagnosis not present

## 2023-12-12 DIAGNOSIS — Z79899 Other long term (current) drug therapy: Secondary | ICD-10-CM | POA: Insufficient documentation

## 2023-12-12 DIAGNOSIS — Z9981 Dependence on supplemental oxygen: Secondary | ICD-10-CM | POA: Insufficient documentation

## 2023-12-12 DIAGNOSIS — J439 Emphysema, unspecified: Secondary | ICD-10-CM | POA: Insufficient documentation

## 2023-12-12 DIAGNOSIS — R079 Chest pain, unspecified: Secondary | ICD-10-CM | POA: Diagnosis not present

## 2023-12-12 DIAGNOSIS — I480 Paroxysmal atrial fibrillation: Secondary | ICD-10-CM | POA: Insufficient documentation

## 2023-12-12 LAB — CBC WITH DIFFERENTIAL/PLATELET
Abs Immature Granulocytes: 0.03 10*3/uL (ref 0.00–0.07)
Basophils Absolute: 0.1 10*3/uL (ref 0.0–0.1)
Basophils Relative: 1 %
Eosinophils Absolute: 0.4 10*3/uL (ref 0.0–0.5)
Eosinophils Relative: 5 %
HCT: 41.8 % (ref 36.0–46.0)
Hemoglobin: 13.5 g/dL (ref 12.0–15.0)
Immature Granulocytes: 0 %
Lymphocytes Relative: 22 %
Lymphs Abs: 1.7 10*3/uL (ref 0.7–4.0)
MCH: 30.5 pg (ref 26.0–34.0)
MCHC: 32.3 g/dL (ref 30.0–36.0)
MCV: 94.4 fL (ref 80.0–100.0)
Monocytes Absolute: 0.6 10*3/uL (ref 0.1–1.0)
Monocytes Relative: 8 %
Neutro Abs: 5 10*3/uL (ref 1.7–7.7)
Neutrophils Relative %: 64 %
Platelets: 381 10*3/uL (ref 150–400)
RBC: 4.43 MIL/uL (ref 3.87–5.11)
RDW: 12.5 % (ref 11.5–15.5)
WBC: 7.7 10*3/uL (ref 4.0–10.5)
nRBC: 0 % (ref 0.0–0.2)

## 2023-12-12 LAB — BASIC METABOLIC PANEL WITH GFR
Anion gap: 8 (ref 5–15)
BUN: 21 mg/dL (ref 8–23)
CO2: 29 mmol/L (ref 22–32)
Calcium: 9.4 mg/dL (ref 8.9–10.3)
Chloride: 97 mmol/L — ABNORMAL LOW (ref 98–111)
Creatinine, Ser: 0.97 mg/dL (ref 0.44–1.00)
GFR, Estimated: >60 mL/min (ref >60)
Glucose, Bld: 94 mg/dL (ref 70–99)
Potassium: 4.4 mmol/L (ref 3.5–5.1)
Sodium: 134 mmol/L — ABNORMAL LOW (ref 135–145)

## 2023-12-12 LAB — TROPONIN I (HIGH SENSITIVITY)
Troponin I (High Sensitivity): 9 ng/L (ref <18)
Troponin I (High Sensitivity): 11 ng/L (ref <18)

## 2023-12-12 LAB — PROTIME-INR
INR: 1 (ref 0.8–1.2)
Prothrombin Time: 13.1 s (ref 11.4–15.2)

## 2023-12-12 LAB — APTT: aPTT: 29 s (ref 24–36)

## 2023-12-12 LAB — CBG MONITORING, ED: Glucose-Capillary: 140 mg/dL — ABNORMAL HIGH (ref 70–99)

## 2023-12-12 MED ORDER — NITROGLYCERIN 0.4 MG SL SUBL: 0.4000 mg | SUBLINGUAL_TABLET | SUBLINGUAL | Status: DC | PRN

## 2023-12-12 NOTE — Discharge Instructions (Signed)
 Thank you for coming to [facility]. You were seen for chest pressure, high blood sugar.  Your blood sugar was no longer however the time he got to the emergency department.  Your chest pain workup was reassuring however please follow-up with your cardiologist within 1 week.  Please follow up with your primary care provider within 1 week.   Do not hesitate to return to the ED or call 911 if you experience: -Worsening symptoms -Lightheadedness, passing out -Fevers/chills -Anything else that concerns you

## 2023-12-12 NOTE — ED Notes (Signed)
 Patient left without AVS

## 2023-12-12 NOTE — ED Notes (Signed)
 Pt came to the nurses station stating that she was upset with how the nurse had spoken with her. This tech tried to explain that she was up for discharge but she insisted that she was going to leave.

## 2023-12-12 NOTE — Progress Notes (Addendum)
 Pt arrived to PAT with c/o nausea, shaking, and "feeling bad". Continuous blood glucose monitor reads 305 mg/dl. Pt has been holding both mounjaro  and jardiance  for EGD on Thursday per doctor's request and has no injectible regular insulin  at home. Dr Margrette Shield notified and recommended going to ED since PCP is not available to see pt this late in the day. Report called to Karyl Paget, RN charge nurse. Pt transported to ED by w/c.

## 2023-12-12 NOTE — ED Triage Notes (Signed)
 Pt arrived via wheel chair for evaluation of hyperglycemia. Pt reports she was at Endo for pre-procedure screening and reports the Anesthesiologist sent her to the ER for a CBG reading of 317. Pt repots her CBG currently is 189 in Triage. Pt endorses some nausea and reports being off of her monjouro and jardiance  per doctors request leading up her procedure. Pt arrived on 3L Nasal Cannula at baseline.

## 2023-12-12 NOTE — ED Provider Notes (Signed)
 [facility] AT [facility] Provider Note   CSN: [id number] Arrival date & time: 12/12/23  1611     History {Add pertinent medical, surgical, social history, OB history to HPI:1} Chief Complaint  Patient presents with  . Hyperglycemia    Kayla Ryan is a 68 y.o. female with PMH as listed below who presents with  arrived via wheel chair for evaluation of hyperglycemia. Pt reports she was at Endo for pre-procedure screening and reports the Anesthesiologist sent her to the ER for a CBG reading of 317. Pt repots her CBG currently is 189 in Triage. Pt endorses chest pressure that started this afternoon, a/w nausea. It improved with nitroglycerin  but is coming back now. Rated 7/10. No abdominal pain, diarrhea. No leg swelling. No h/o DVT/PE. Does have h/o Afib but doesn't take eliquis d/t h/o GIB. She is off of her monjouro and jardiance  per doctors request leading up her scheduled endoscopy. Pt arrived on 3L Nasal Cannula at baseline for emphysema.   Past Medical History:  Diagnosis Date  . Allergy Unknown  . Anemia   . Anxiety 1883  . Arthritis   . Asthma   . CHF (congestive heart failure) (HCC) 2021  . Chronic bronchitis   . Crohn disease (HCC)   . Emphysema   . Emphysema of lung (HCC) 2003  . Fibromyalgia   . GERD (gastroesophageal reflux disease)   . GI bleed   . Herpes   . History of blood transfusion   . Hyperlipidemia   . Hypertension 2015  . IBS (irritable bowel syndrome)   . Kidney failure   . Migraines   . Muscular dystrophy (HCC)   . Neck pain   . Oxygen  deficiency 2021  . PAF (paroxysmal atrial fibrillation) (HCC)    Not anticoagulated with history of severe GI bleeding  . Plantar fasciitis   . Polymyositis (HCC)   . PONV (postoperative nausea and vomiting)   . Right thyroid  nodule 04/07/2022  . Scoliosis   . Type 2 diabetes mellitus (HCC)        Home Medications Prior to Admission medications   Medication Sig Start Date  End Date Taking? Authorizing Provider  ACCU-CHEK GUIDE test strip USE TO CHECK BLOOD GLUCOSE THREE TIMES DAILY, MORNING, AT NOON, AND AT BEDTIME 09/26/22   Meldon Sport, MD  Accu-Chek Softclix Lancets lancets USE TO TEST BLOOD SUGAR EVERY MORNING, AT NOON AND EVERY NIGHT AT BEDTIME 09/26/22   Meldon Sport, MD  acyclovir  (ZOVIRAX ) 400 MG tablet TAKE 1 TABLET(400 MG) BY MOUTH THREE TIMES DAILY 10/20/23   Meldon Sport, MD  albuterol  (PROVENTIL ) (2.5 MG/3ML) 0.083% nebulizer solution Take 3 mLs (2.5 mg total) by nebulization every 6 (six) hours as needed for wheezing or shortness of breath. 05/30/23 05/29/24  Parrett, Macdonald Savoy, NP  ASPERCREME LIDOCAINE  EX Apply 1 Application topically 3 (three) times daily as needed (pain).    [provider]  cetirizine  (ZYRTEC ) 10 MG tablet Take 10 mg by mouth 2 (two) times daily.    [provider]  Continuous Glucose Receiver (DEXCOM G7 RECEIVER) DEVI USE TO CHECK BLOOD GLUCOSE AS NEEDED 12/08/22   Patel, Rutwik K, MD  Continuous Glucose Sensor (DEXCOM G7 SENSOR) MISC USE TO CHECK BLOOD SUGAR THREE TIMES DAILY, BEFORE MEALS AND AT BEDTIME AND AS NEEDED. CHANGE SENSOR EVERY 10 DAYS 09/26/23   Meldon Sport, MD  dicyclomine  (BENTYL ) 10 MG capsule Take 1 capsule (10 mg total) by mouth  4 (four) times daily -  before meals and at bedtime. Monitor for constipation, dry mouth, dizziness 11/28/23   Delman Ferns, NP  diphenhydrAMINE  (BENADRYL ) 25 MG tablet Take 50 mg by mouth daily as needed for allergies or itching.    [provider]  doxepin  (SINEQUAN ) 10 MG capsule TAKE 1 CAPSULE(10 MG) BY MOUTH AT BEDTIME 11/28/23   Meldon Sport, MD  DULoxetine  (CYMBALTA ) 60 MG capsule TAKE 1 CAPSULE(60 MG) BY MOUTH TWICE DAILY 08/28/23   Meldon Sport, MD  gabapentin  (NEURONTIN ) 800 MG tablet TAKE 1 TABLET(800 MG) BY MOUTH THREE TIMES DAILY 10/02/23   Meldon Sport, MD  Glucagon  (GVOKE HYPOPEN  2-PACK) 1 MG/0.2ML SOAJ Inject 0.2 mLs into the skin as  needed (Blood glucose < 53). 04/11/23   Patel, Rutwik K, MD  Glycerin-Hypromellose-PEG 400 (DRY EYE RELIEF DROPS OP) Place 1 drop into both eyes 3 (three) times daily as needed (Dry eye).    [provider]  HYDROcodone -acetaminophen  (NORCO) 10-325 MG tablet Take 1 tablet by mouth every 4 (four) hours as needed. Stop Hydrocodone  7.5/325mg  tablet. 05/02/22     ipratropium (ATROVENT  HFA) 17 MCG/ACT inhaler Inhale 2 puffs into the lungs every 4 (four) hours as needed (COPD). 01/20/23   Byrum, Robert S, MD  isosorbide  dinitrate (ISORDIL ) 30 MG tablet TAKE 1 TABLET(30 MG) BY MOUTH DAILY 08/28/23   Meldon Sport, MD  JARDIANCE  10 MG TABS tablet TAKE 1 TABLET(10 MG) BY MOUTH DAILY BEFORE BREAKFAST 08/22/23   Patel, Rutwik K, MD  Lancets Misc. (ACCU-CHEK SOFTCLIX LANCET DEV) KIT USE TO CHECK GLUCOSE THREE TIMES DAILY 09/26/22   Meldon Sport, MD  LORazepam  (ATIVAN ) 0.5 MG tablet Take 1 tablet (0.5 mg total) by mouth 3 (three) times daily as needed for anxiety. 08/28/23   Meldon Sport, MD  Magnesium  Hydroxide (MILK OF MAGNESIA PO) Take by mouth. Takes as needed    [provider]  meclizine  (ANTIVERT ) 25 MG tablet TAKE 1 TABLET(25 MG) BY MOUTH THREE TIMES DAILY AS NEEDED FOR DIZZINESS 10/20/23   Meldon Sport, MD  methocarbamol  (ROBAXIN ) 500 MG tablet TAKE 1 TABLET(500 MG) BY MOUTH EVERY 8 HOURS AS NEEDED FOR MUSCLE SPASMS 11/20/23   Meldon Sport, MD  Misc. Devices KIT Henmnii rollator walker 11/01/22   Meldon Sport, MD  montelukast  (SINGULAIR ) 10 MG tablet TAKE 1 TABLET(10 MG) BY MOUTH AT BEDTIME 08/28/23   Byrum, Delora Ferry, MD  nitroGLYCERIN  (NITROSTAT ) 0.4 MG SL tablet Place 1 tablet (0.4 mg total) under the tongue every 5 (five) minutes as needed for chest pain. 08/03/23 11/28/23  Gerard Knight, MD  omeprazole  (PRILOSEC) 40 MG capsule TAKE 1 CAPSULE(40 MG) BY MOUTH TWICE DAILY 12/04/23   Meldon Sport, MD  ondansetron  (ZOFRAN -ODT) 8 MG disintegrating tablet DISSOLVE 1 TABLET EVERY  8 HOURS AS NEEDED FOR FOR NAUSEA AND VOMITING 11/10/23   Paulett Boros, MD  rifaximin  (XIFAXAN ) 550 MG TABS tablet Take 1 tablet (550 mg total) by mouth 3 (three) times daily for 14 days. 11/28/23 12/12/23  Delman Ferns, NP  STIOLTO RESPIMAT  2.5-2.5 MCG/ACT AERS INHALE 2 PUFFS INTO THE LUNGS DAILY 07/26/23   Meldon Sport, MD  tirzepatide  (MOUNJARO ) 5 MG/0.5ML Pen Inject 5 mg into the skin once a week. 07/04/23   Meldon Sport, MD      Allergies    Sulfa antibiotics, Chicken allergy, Clindamycin /lincomycin, Dilaudid  [hydromorphone  hcl], Ferrlecit [na ferric gluc cplx in sucrose], Iron , Metronidazole, Other, Prednisone ,  Statins, Tuna oil [fish oil], Budesonide , Cephalexin, Methotrexate derivatives, Morphine  and codeine, and Tomato    Review of Systems   Review of Systems A 10 point review of systems was performed and is negative unless otherwise reported in HPI.  Physical Exam Updated Vital Signs BP 137/69 (BP Location: Right Arm)   Pulse 99   Temp 97.6 F (36.4 C) (Temporal)   Resp 18   Ht 5\' 8"  (1.727 m)   Wt 72 kg   SpO2 97%   BMI 24.14 kg/m  Physical Exam General: Normal appearing {Desc; female/female:11659}, lying in bed.  HEENT: PERRLA, Sclera anicteric, MMM, trachea midline.  Cardiology: RRR, no murmurs/rubs/gallops. BL radial and DP pulses equal bilaterally.  Resp: Normal respiratory rate and effort. CTAB, no wheezes, rhonchi, crackles.  Abd: Soft, non-tender, non-distended. No rebound tenderness or guarding.  GU: Deferred. MSK: No peripheral edema or signs of trauma. Extremities without deformity or TTP. No cyanosis or clubbing. Skin: warm, dry. No rashes or lesions. Back: No CVA tenderness Neuro: A&Ox4, CNs II-XII grossly intact. MAEs. Sensation grossly intact.  Psych: Normal mood and affect.   ED Results / Procedures / Treatments   Labs (all labs ordered are listed, but only abnormal results are displayed) Labs Reviewed  BASIC METABOLIC PANEL WITH GFR     EKG None  Radiology No results found.  Procedures Procedures  {Document cardiac monitor, telemetry assessment procedure when appropriate:1}  Medications Ordered in ED Medications - No data to display  ED Course/ Medical Decision Making/ A&P                          Medical Decision Making Amount and/or Complexity of Data Reviewed Labs: ordered. Radiology: ordered.  Risk Prescription drug management.    This patient presents to the ED for concern of ***, this involves an extensive number of treatment options, and is a complaint that carries with it a high risk of complications and morbidity.  I considered the following differential and admission for this acute, potentially life threatening condition.   MDM:    ***  Clinical Course as of 12/12/23 1922  Tue Dec 12, 2023  1902 Glucose: 94 wnl [HN]  1902 CBC with Differential wnl [HN]    Clinical Course User Index [HN] Merdis Stalling, MD    Labs: I Ordered, and personally interpreted labs.  The pertinent results include:  ***  Imaging Studies ordered: I ordered imaging studies including *** I independently visualized and interpreted imaging. I agree with the radiologist interpretation  Additional history obtained from ***.  External records from outside source obtained and reviewed including ***  Cardiac Monitoring: .The patient was maintained on a cardiac monitor.  I personally viewed and interpreted the cardiac monitored which showed an underlying rhythm of: ***  Reevaluation: After the interventions noted above, I reevaluated the patient and found that they have :{resolved/improved/worsened:23923::"improved"}  Social Determinants of Health: .***  Disposition:  ***  Co morbidities that complicate the patient evaluation . Past Medical History:  Diagnosis Date  . Allergy Unknown  . Anemia   . Anxiety 1883  . Arthritis   . Asthma   . CHF (congestive heart failure) (HCC) 2021  . Chronic  bronchitis   . Crohn disease (HCC)   . Emphysema   . Emphysema of lung (HCC) 2003  . Fibromyalgia   . GERD (gastroesophageal reflux disease)   . GI bleed   . Herpes   . History of blood  transfusion   . Hyperlipidemia   . Hypertension 2015  . IBS (irritable bowel syndrome)   . Kidney failure   . Migraines   . Muscular dystrophy (HCC)   . Neck pain   . Oxygen  deficiency 2021  . PAF (paroxysmal atrial fibrillation) (HCC)    Not anticoagulated with history of severe GI bleeding  . Plantar fasciitis   . Polymyositis (HCC)   . PONV (postoperative nausea and vomiting)   . Right thyroid  nodule 04/07/2022  . Scoliosis   . Type 2 diabetes mellitus (HCC)      Medicines No orders of the defined types were placed in this encounter.   I have reviewed the patients home medicines and have made adjustments as needed  Problem List / ED Course: Problem List Items Addressed This Visit   None        {Document critical care time when appropriate:1} {Document review of labs and clinical decision tools ie heart score, Chads2Vasc2 etc:1}  {Document your independent review of radiology images, and any outside records:1} {Document your discussion with family members, caretakers, and with consultants:1} {Document social determinants of health affecting pt's care:1} {Document your decision making why or why not admission, treatments were needed:1}  This note was created using dictation software, which may contain spelling or grammatical errors.

## 2023-12-13 ENCOUNTER — Telehealth: Payer: Self-pay | Admitting: *Deleted

## 2023-12-13 ENCOUNTER — Encounter: Payer: Self-pay | Admitting: *Deleted

## 2023-12-13 ENCOUNTER — Ambulatory Visit (INDEPENDENT_AMBULATORY_CARE_PROVIDER_SITE_OTHER): Payer: 59 | Admitting: Internal Medicine

## 2023-12-13 ENCOUNTER — Encounter: Payer: Self-pay | Admitting: Internal Medicine

## 2023-12-13 VITALS — BP 138/64 | HR 101 | Ht 68.0 in | Wt 163.6 lb

## 2023-12-13 DIAGNOSIS — K219 Gastro-esophageal reflux disease without esophagitis: Secondary | ICD-10-CM | POA: Diagnosis not present

## 2023-12-13 DIAGNOSIS — J9611 Chronic respiratory failure with hypoxia: Secondary | ICD-10-CM

## 2023-12-13 DIAGNOSIS — Z0001 Encounter for general adult medical examination with abnormal findings: Secondary | ICD-10-CM

## 2023-12-13 DIAGNOSIS — I5032 Chronic diastolic (congestive) heart failure: Secondary | ICD-10-CM | POA: Diagnosis not present

## 2023-12-13 DIAGNOSIS — Z7984 Long term (current) use of oral hypoglycemic drugs: Secondary | ICD-10-CM | POA: Diagnosis not present

## 2023-12-13 DIAGNOSIS — E559 Vitamin D deficiency, unspecified: Secondary | ICD-10-CM

## 2023-12-13 DIAGNOSIS — E782 Mixed hyperlipidemia: Secondary | ICD-10-CM | POA: Diagnosis not present

## 2023-12-13 DIAGNOSIS — R1314 Dysphagia, pharyngoesophageal phase: Secondary | ICD-10-CM

## 2023-12-13 DIAGNOSIS — J449 Chronic obstructive pulmonary disease, unspecified: Secondary | ICD-10-CM

## 2023-12-13 DIAGNOSIS — E1149 Type 2 diabetes mellitus with other diabetic neurological complication: Secondary | ICD-10-CM

## 2023-12-13 DIAGNOSIS — I10 Essential (primary) hypertension: Secondary | ICD-10-CM

## 2023-12-13 DIAGNOSIS — Z7985 Long-term (current) use of injectable non-insulin antidiabetic drugs: Secondary | ICD-10-CM

## 2023-12-13 DIAGNOSIS — R7989 Other specified abnormal findings of blood chemistry: Secondary | ICD-10-CM

## 2023-12-13 MED ORDER — OMEPRAZOLE 40 MG PO CPDR: 40.0000 mg | DELAYED_RELEASE_CAPSULE | Freq: Two times a day (BID) | ORAL | 3 refills | Status: DC

## 2023-12-13 NOTE — Progress Notes (Addendum)
 Established Patient Office Visit  Subjective:  Patient ID: Kayla Ryan, female    DOB: [birth year]  Age: 68 y.o. MRN: [id number]  CC:  Chief Complaint  Patient presents with   Annual Exam    Cpe , reports she had a ER visit yesterday. Is having some nausea. Has endoscopy scheduled tomorrow.    HPI Kayla Ryan is a 68 y.o. female with past medical history of HTN, HFpEF, COPD, GERD, type II DM with neuropathy, CKD, GAD and chronic pain syndrome who presents for annual physical.  She was sent to ER from preop testing yesterday due to elevated blood glucose at 317.  Of note, she has not taken her Mounjaro  in this week in preparation for EGD tomorrow.  Her CGM shows episodes of hyperglycemia up to 300s after meals, but have overall return to levels around 150 after 3 hours.  She is advised to restart Mounjaro  after her EGD.  Type II DM: She has been taking Mounjaro , and has been tolerating it well.  Her blood glucose has improved now, denies episodes of hypoglycemia now. She has a CGM - 98% within target range, <1% episode of hypoglycemia.  She is tolerating Jardiance  now.  She has chronic numbness of bilateral foot.  She takes gabapentin  800 mg TID currently.  Denies any polyuria or polyphagia.  She has chronic fatigue.  GAD: She takes Ativan  0.5 mg 3 times daily as needed. She is also on Cymbalta  60 mg BID.   She has history of COPD and chronic hypoxic respiratory failure.  She has acute Stiolto and as needed albuterol .  She uses 3 LPM O2 at home.  She has chronic low back pain, followed by pain clinic.  She takes Cymbalta  60 mg twice daily, gabapentin  800 mg 3 times daily and Norco 10-325 mg q4h PRN. She also has Robaxin  as needed for muscle spasms. Denies saddle anesthesia, urinary or stool incontinence.  She has history of AVM of small bowel, which had led to chronic GI bleeding in the past.  Denies hematemesis or hematochezia.  She takes omeprazole  40 mg BID for GERD.  She has  chronic nausea, planned to get EGD tomorrow.  She reports excessive sweating at times with exertion.  Denies any fever, chills, recent unintentional weight loss, LAD or hemoptysis.  Past Medical History:  Diagnosis Date   Allergy Unknown   Anemia    Anxiety 1883   Arthritis    Asthma    CHF (congestive heart failure) (HCC) 2021   Chronic bronchitis    Crohn disease (HCC)    Emphysema    Emphysema of lung (HCC) 2003   Fibromyalgia    GERD (gastroesophageal reflux disease)    GI bleed    Herpes    History of blood transfusion    Hyperlipidemia    Hypertension 2015   IBS (irritable bowel syndrome)    Kidney failure    Migraines    Muscular dystrophy (HCC)    Neck pain    Oxygen  deficiency 2021   PAF (paroxysmal atrial fibrillation) (HCC)    Not anticoagulated with history of severe GI bleeding   Plantar fasciitis    Polymyositis (HCC)    PONV (postoperative nausea and vomiting)    Right thyroid  nodule 04/07/2022   Scoliosis    Type 2 diabetes mellitus (HCC)     Past Surgical History:  Procedure Laterality Date   ABDOMINAL HYSTERECTOMY     AGILE CAPSULE N/A 06/24/2021   Procedure: IANTHA  CAPSULE;  Surgeon: Cindie Carlin POUR, DO;  Location: [facility];  Service: Endoscopy;  Laterality: N/A;   AGILE CAPSULE N/A 07/22/2021   Procedure: AGILE CAPSULE;  Surgeon: Shaaron Lamar HERO, MD;  Location: [facility];  Service: Endoscopy;  Laterality: N/A;  7:30am   bone spur     BREAST SURGERY  1995   CHOLECYSTECTOMY     COLONOSCOPY WITH PROPOFOL  N/A 04/30/2021   Procedure: COLONOSCOPY WITH PROPOFOL ;  Surgeon: Shaaron Lamar HERO, MD;  Location: [facility];  Service: Endoscopy;  Laterality: N/A;   ESOPHAGEAL DILATION N/A 04/30/2021   Procedure: ESOPHAGEAL DILATION;  Surgeon: Shaaron Lamar HERO, MD;  Location: [facility];  Service: Endoscopy;  Laterality: N/A;   ESOPHAGOGASTRODUODENOSCOPY (EGD) WITH PROPOFOL  N/A 04/30/2021   Procedure: ESOPHAGOGASTRODUODENOSCOPY (EGD) WITH  PROPOFOL ;  Surgeon: Shaaron Lamar HERO, MD;  Location: [facility];  Service: Endoscopy;  Laterality: N/A;   GIVENS CAPSULE STUDY N/A 09/22/2021   Procedure: GIVENS CAPSULE STUDY;  Surgeon: Shaaron Lamar HERO, MD;  Location: [facility];  Service: Endoscopy;  Laterality: N/A;  7:30am   HERNIA REPAIR     LEFT HEART CATHETERIZATION WITH CORONARY ANGIOGRAM N/A 11/07/2013   Procedure: LEFT HEART CATHETERIZATION WITH CORONARY ANGIOGRAM;  Surgeon: Dorn JINNY Lesches, MD;  Location: [facility];  Service: Cardiovascular;  Laterality: N/A;   MASTECTOMY PARTIAL / LUMPECTOMY     OOPHORECTOMY     ROTATOR CUFF REPAIR     TOOTH EXTRACTION N/A 04/15/2022   Procedure: DENTAL RESTORATION/EXTRACTIONS;  Surgeon: Sheryle Hamilton, DMD;  Location: [facility];  Service: Oral Surgery;  Laterality: N/A;   TOOTH EXTRACTION N/A 09/15/2023   Procedure: DENTAL RESTORATION/EXTRACTIONS;  Surgeon: Sheryle Hamilton, DMD;  Location: [facility];  Service: Oral Surgery;  Laterality: N/A;    Family History  Problem Relation Age of Onset   Lung cancer Mother    Cancer Mother    Miscarriages / Stillbirths Mother    Varicose Veins Mother    Hypertension Mother    COPD Father    Lung cancer Father    Lymphoma Father    Alcohol  abuse Father    Arthritis Father    Anxiety disorder Sister    Anxiety disorder Sister    Intellectual disability Sister    Depression Brother    Inflammatory bowel disease Neg Hx    Colon cancer Neg Hx     Social History   Socioeconomic History   Marital status: Widowed    Spouse name: Not on file   Number of children: Not on file   Years of education: Not on file   Highest education level: Associate degree: occupational, scientist, product/process development, or vocational program  Occupational History   Not on file  Tobacco Use   Smoking status: Former    Current packs/day: 0.00    Average packs/day: 1 pack/day for 49.0 years (49.0 ttl pk-yrs)    Types: Cigarettes    Start date: 08/08/1966    Quit date: 08/31/2005    Years since  quitting: 18.2   Smokeless tobacco: Never  Vaping Use   Vaping status: Never Used  Substance and Sexual Activity   Alcohol  use: No   Drug use: No    Comment: used marijuana in teens   Sexual activity: Not Currently    Birth control/protection: Surgical  Other Topics Concern   Not on file  Social History Narrative   Are you right handed or left handed? Left Handed    Are you currently employed ?  Retired    What is your current occupation?   Do you live at home alone? Yes   Who lives with you?    What type of home do you live in: 1 story or 2 story? Lives in a one story home        Social Drivers of Health   Financial Resource Strain: Medium Risk (08/10/2023)   Overall Financial Resource Strain (CARDIA)    Difficulty of Paying Living Expenses: Somewhat hard  Food Insecurity: Food Insecurity Present (08/10/2023)   Hunger Vital Sign    Worried About Running Out of Food in the Last Year: Often true    Ran Out of Food in the Last Year: Sometimes true  Transportation Needs: No Transportation Needs (08/10/2023)   PRAPARE - Administrator, Civil Service (Medical): No    Lack of Transportation (Non-Medical): No  Physical Activity: Insufficiently Active (08/10/2023)   Exercise Vital Sign    Days of Exercise per Week: 7 days    Minutes of Exercise per Session: 20 min  Stress: Stress Concern Present (08/10/2023)   [employer] of Occupational Health - Occupational Stress Questionnaire    Feeling of Stress : To some extent  Social Connections: Unknown (08/10/2023)   Social Connection and Isolation Panel [NHANES]    Frequency of Communication with Friends and Family: More than three times a week    Frequency of Social Gatherings with Friends and Family: Patient declined    Attends Religious Services: Patient declined    Database Administrator or Organizations: Patient declined    Attends Banker Meetings: Never    Marital Status: Widowed  Intimate Partner Violence:  Not At Risk (04/17/2023)   Humiliation, Afraid, Rape, and Kick questionnaire    Fear of Current or Ex-Partner: No    Emotionally Abused: No    Physically Abused: No    Sexually Abused: No    Outpatient Medications Prior to Visit  Medication Sig Dispense Refill   ACCU-CHEK GUIDE test strip USE TO CHECK BLOOD GLUCOSE THREE TIMES DAILY, MORNING, AT NOON, AND AT BEDTIME 100 strip 0   Accu-Chek Softclix Lancets lancets USE TO TEST BLOOD SUGAR EVERY MORNING, AT NOON AND EVERY NIGHT AT BEDTIME 100 each 0   acyclovir  (ZOVIRAX ) 400 MG tablet TAKE 1 TABLET(400 MG) BY MOUTH THREE TIMES DAILY 30 tablet 3   albuterol  (PROVENTIL ) (2.5 MG/3ML) 0.083% nebulizer solution Take 3 mLs (2.5 mg total) by nebulization every 6 (six) hours as needed for wheezing or shortness of breath. 75 mL 5   ASPERCREME LIDOCAINE  EX Apply 1 Application topically 3 (three) times daily as needed (pain).     cetirizine  (ZYRTEC ) 10 MG tablet Take 10 mg by mouth 2 (two) times daily.     Continuous Glucose Receiver (DEXCOM G7 RECEIVER) DEVI USE TO CHECK BLOOD GLUCOSE AS NEEDED 1 each 0   Continuous Glucose Sensor (DEXCOM G7 SENSOR) MISC USE TO CHECK BLOOD SUGAR THREE TIMES DAILY, BEFORE MEALS AND AT BEDTIME AND AS NEEDED. CHANGE SENSOR EVERY 10 DAYS 3 each 5   dicyclomine  (BENTYL ) 10 MG capsule Take 1 capsule (10 mg total) by mouth 4 (four) times daily -  before meals and at bedtime. Monitor for constipation, dry mouth, dizziness 120 capsule 3   diphenhydrAMINE  (BENADRYL ) 25 MG tablet Take 50 mg by mouth daily as needed for allergies or itching.     doxepin  (SINEQUAN ) 10 MG capsule TAKE 1 CAPSULE(10 MG) BY MOUTH AT BEDTIME 30 capsule 3  DULoxetine  (CYMBALTA ) 60 MG capsule TAKE 1 CAPSULE(60 MG) BY MOUTH TWICE DAILY 60 capsule 3   gabapentin  (NEURONTIN ) 800 MG tablet TAKE 1 TABLET(800 MG) BY MOUTH THREE TIMES DAILY 90 tablet 5   Glucagon  (GVOKE HYPOPEN  2-PACK) 1 MG/0.2ML SOAJ Inject 0.2 mLs into the skin as needed (Blood glucose < 53). 0.4  mL 5   Glycerin-Hypromellose-PEG 400 (DRY EYE RELIEF DROPS OP) Place 1 drop into both eyes 3 (three) times daily as needed (Dry eye).     HYDROcodone -acetaminophen  (NORCO) 10-325 MG tablet Take 1 tablet by mouth every 4 (four) hours as needed. Stop Hydrocodone  7.5/325mg  tablet. 180 tablet 0   ipratropium (ATROVENT  HFA) 17 MCG/ACT inhaler Inhale 2 puffs into the lungs every 4 (four) hours as needed (COPD). 1 each 4   isosorbide  dinitrate (ISORDIL ) 30 MG tablet TAKE 1 TABLET(30 MG) BY MOUTH DAILY 90 tablet 1   JARDIANCE  10 MG TABS tablet TAKE 1 TABLET(10 MG) BY MOUTH DAILY BEFORE BREAKFAST 30 tablet 5   Lancets Misc. (ACCU-CHEK SOFTCLIX LANCET DEV) KIT USE TO CHECK GLUCOSE THREE TIMES DAILY 1 kit 0   LORazepam  (ATIVAN ) 0.5 MG tablet Take 1 tablet (0.5 mg total) by mouth 3 (three) times daily as needed for anxiety. 90 tablet 3   Magnesium  Hydroxide (MILK OF MAGNESIA PO) Take by mouth. Takes as needed     meclizine  (ANTIVERT ) 25 MG tablet TAKE 1 TABLET(25 MG) BY MOUTH THREE TIMES DAILY AS NEEDED FOR DIZZINESS 30 tablet 1   methocarbamol  (ROBAXIN ) 500 MG tablet TAKE 1 TABLET(500 MG) BY MOUTH EVERY 8 HOURS AS NEEDED FOR MUSCLE SPASMS 30 tablet 1   Misc. Devices KIT Henmnii rollator walker 1 kit 0   montelukast  (SINGULAIR ) 10 MG tablet TAKE 1 TABLET(10 MG) BY MOUTH AT BEDTIME 30 tablet 3   ondansetron  (ZOFRAN -ODT) 8 MG disintegrating tablet DISSOLVE 1 TABLET EVERY 8 HOURS AS NEEDED FOR FOR NAUSEA AND VOMITING 257 tablet 1   STIOLTO RESPIMAT  2.5-2.5 MCG/ACT AERS INHALE 2 PUFFS INTO THE LUNGS DAILY 4 g 11   tirzepatide  (MOUNJARO ) 5 MG/0.5ML Pen Inject 5 mg into the skin once a week. 6 mL 1   omeprazole  (PRILOSEC) 40 MG capsule TAKE 1 CAPSULE(40 MG) BY MOUTH TWICE DAILY 180 capsule 0   nitroGLYCERIN  (NITROSTAT ) 0.4 MG SL tablet Place 1 tablet (0.4 mg total) under the tongue every 5 (five) minutes as needed for chest pain. 90 tablet 3   No facility-administered medications prior to visit.    Allergies   Allergen Reactions   Sulfa Antibiotics Shortness Of Breath, Swelling and Rash   Chicken Allergy Diarrhea and Nausea And Vomiting   Clindamycin /Lincomycin Itching and Nausea And Vomiting   Dilaudid  [Hydromorphone  Hcl]     Makes me crazy   Ferrlecit [Na Ferric Gluc Cplx In Sucrose] Itching    Itching and throat tightness   Iron      Throat starts closing up, tongue swells, severe headaches and backpain   Metronidazole Diarrhea and Nausea And Vomiting   Other     Green peas - itching and nauea   Prednisone  Other (See Comments)    Angry     Statins Other (See Comments)    Leg pain   Tuna Oil [Fish Oil] Diarrhea and Nausea And Vomiting   Budesonide  Nausea And Vomiting and Rash    Nebulizing solution    Cephalexin Diarrhea and Nausea And Vomiting   Methotrexate Derivatives Swelling and Rash   Morphine  And Codeine Itching and Anxiety    exreme  mood swings   Tomato Itching and Rash    ROS Review of Systems  Constitutional:  Positive for fatigue. Negative for chills and fever.  HENT:  Negative for congestion, sinus pressure, sinus pain and sore throat.   Eyes:  Negative for pain and discharge.  Respiratory:  Positive for shortness of breath. Negative for cough.   Cardiovascular:  Negative for chest pain and palpitations.  Gastrointestinal:  Positive for nausea. Negative for abdominal pain and vomiting.  Endocrine: Negative for polydipsia and polyuria.  Genitourinary:  Negative for dysuria and hematuria.  Musculoskeletal:  Positive for arthralgias, back pain, gait problem and neck pain. Negative for neck stiffness.  Skin:  Negative for rash.  Neurological:  Negative for dizziness and weakness.  Psychiatric/Behavioral:  Positive for sleep disturbance. Negative for agitation and behavioral problems. The patient is nervous/anxious.       Objective:    Physical Exam Vitals reviewed.  Constitutional:      General: She is not in acute distress.    Appearance: She is not  diaphoretic.  HENT:     Head: Normocephalic and atraumatic.     Nose: No congestion.     Mouth/Throat:     Mouth: Mucous membranes are moist.  Eyes:     General: No scleral icterus.    Extraocular Movements: Extraocular movements intact.  Neck:     Comments: Right-sided neck mass - thyroid  nodule, nontender, about 3 cm in diameter Cardiovascular:     Rate and Rhythm: Normal rate and regular rhythm.     Pulses: Normal pulses.     Heart sounds: Normal heart sounds. No murmur heard. Pulmonary:     Breath sounds: Normal breath sounds. No wheezing or rales.     Comments: On 3 LPM O2 Abdominal:     Palpations: Abdomen is soft.     Tenderness: There is abdominal tenderness (Epigastric).  Musculoskeletal:     Cervical back: Neck supple. No tenderness.     Right lower leg: No edema.     Left lower leg: No edema.  Skin:    General: Skin is warm.     Findings: No rash.  Neurological:     General: No focal deficit present.     Mental Status: She is alert and oriented to person, place, and time.     Sensory: Sensory deficit present.     Motor: Weakness (4/5 in b/l UE and LE) present.  Psychiatric:        Mood and Affect: Mood normal.        Behavior: Behavior normal.     BP 138/64 (BP Location: Left Arm)   Pulse (!) 101   Ht 5' 8 (1.727 m)   Wt 163 lb 9.6 oz (74.2 kg)   SpO2 95%   BMI 24.88 kg/m  Wt Readings from Last 3 Encounters:  12/13/23 163 lb 9.6 oz (74.2 kg)  12/12/23 158 lb 11.7 oz (72 kg)  12/12/23 158 lb 11.2 oz (72 kg)    Lab Results  Component Value Date   TSH 3.800 09/01/2022   Lab Results  Component Value Date   WBC 7.7 12/12/2023   HGB 13.5 12/12/2023   HCT 41.8 12/12/2023   MCV 94.4 12/12/2023   PLT 381 12/12/2023   Lab Results  Component Value Date   NA 134 (L) 12/12/2023   K 4.4 12/12/2023   CO2 29 12/12/2023   GLUCOSE 94 12/12/2023   BUN 21 12/12/2023   CREATININE 0.97 12/12/2023  BILITOT 0.3 11/28/2023   ALKPHOS 151 (H) 11/28/2023    AST 79 (H) 11/28/2023   ALT 112 (H) 11/28/2023   PROT 7.2 11/28/2023   ALBUMIN 4.7 11/28/2023   CALCIUM  9.4 12/12/2023   ANIONGAP 8 12/12/2023   EGFR 74 08/14/2023   GFR 58.40 (L) 01/28/2021   Lab Results  Component Value Date   CHOL 200 (H) 12/08/2022   Lab Results  Component Value Date   HDL 46 12/08/2022   Lab Results  Component Value Date   LDLCALC 116 (H) 12/08/2022   Lab Results  Component Value Date   TRIG 216 (H) 12/08/2022   Lab Results  Component Value Date   CHOLHDL 4.3 12/08/2022   Lab Results  Component Value Date   HGBA1C 5.5 08/14/2023      Assessment & Plan:   Problem List Items Addressed This Visit       Cardiovascular and Mediastinum   Essential hypertension   BP Readings from Last 1 Encounters:  12/13/23 138/64   Well-controlled with Imdur  (for CAD/HFpEF) Counseled for compliance with the medications Advised DASH diet and moderate exercise/walking as tolerated      Relevant Orders   TSH   CMP14+EGFR   CBC with Differential/Platelet   (HFpEF) heart failure with preserved ejection fraction (HCC)   Had acute CHF exacerbation in the past Currently euvolemic On Jardiance  Followed by Cardiology      Relevant Orders   TSH     Respiratory   Chronic obstructive pulmonary disease (HCC)   Well controlled with Stiolto and as needed albuterol  Followed by Pulmonology Has 3 lpm home O2 for chronic hypoxia      Chronic respiratory failure with hypoxia (HCC)   Due to COPD Followed by Pulmonology Has 3 lpm home O2 for chronic hypoxia        Digestive   Dysphagia   Followed by GI On Omeprazole  40 mg BID now, refilled for now      Gastroesophageal reflux disease without esophagitis   Overall better with omeprazole  40 mg BID Followed by GI - planned to get EGD      Relevant Medications   omeprazole  (PRILOSEC) 40 MG capsule     Endocrine   Type 2 diabetes mellitus with neurological complications (HCC)   Lab Results  Component  Value Date   HGBA1C 5.5 08/14/2023   Well controlled usually, had hyperglycemia in this week due to not being able to take Mounjaro  On Mounjaro  5 mg qw Has had episodes of hypoglycemia in the past, has CGM now, no recent episode of hypoglycemia  On Jardiance  for HFpEF (did not tolerate Farxiga  10 mg) Advised to follow diabetic diet Has statin intolerance F/u CMP and lipid panel Diabetic eye exam: Advised to follow up with Ophthalmology for diabetic eye exam  Takes gabapentin  800 mg TID, she has noticed improvement in numbness and tingling of bilateral LE      Relevant Orders   Hemoglobin A1c   CMP14+EGFR     Other   Hyperlipidemia   Advised to follow low carb diet 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 Fasting blood tests ordered today. Advised to get Shingrix and Tdap vaccines at local pharmacy.      Other Visit Diagnoses       Vitamin D  deficiency  Relevant Orders   VITAMIN D  25 Hydroxy (Vit-D Deficiency, Fractures)       Meds ordered this encounter  Medications   omeprazole  (PRILOSEC) 40 MG capsule    Sig: Take 1 capsule (40 mg total) by mouth 2 (two) times daily.    Dispense:  180 capsule    Refill:  3    Follow-up: Return in about 4 weeks (around 01/10/2024) for HTN and DM.    Suzzane MARLA Blanch, MD

## 2023-12-13 NOTE — Patient Instructions (Addendum)
 Please continue to take medications as prescribed.  Please continue to follow low carb diet and ambulate as tolerated.  Please get fasting blood tests done before the next visit.  Please consider getting Shingrix and Tdap vaccine at local pharmacy.

## 2023-12-13 NOTE — Telephone Encounter (Signed)
 Spoke with pt. She stated she went for pre-op yesterday and was sent to ED bc her BS was high. She stated it has came down to 128 but she also has not been able to take her mounjaro  or jardiance . She stated she only had chest tightness because of her reflux and has been out of her medication. FYI

## 2023-12-13 NOTE — Assessment & Plan Note (Signed)
 Overall better with omeprazole  40 mg BID Followed by GI - planned to get EGD

## 2023-12-13 NOTE — Assessment & Plan Note (Addendum)
 Physical exam as documented. Fasting blood tests ordered today. Advised to get Shingrix and Tdap vaccines at local pharmacy.

## 2023-12-13 NOTE — Assessment & Plan Note (Signed)
 Lab Results  Component Value Date   HGBA1C 5.5 08/14/2023   Well controlled usually, had hyperglycemia in this week due to not being able to take Mounjaro  On Mounjaro  5 mg qw Has had episodes of hypoglycemia in the past, has CGM now, no recent episode of hypoglycemia  On Jardiance  for HFpEF (did not tolerate Farxiga  10 mg) Advised to follow diabetic diet Has statin intolerance F/u CMP and lipid panel Diabetic eye exam: Advised to follow up with Ophthalmology for diabetic eye exam  Takes gabapentin  800 mg TID, she has noticed improvement in numbness and tingling of bilateral LE

## 2023-12-13 NOTE — Assessment & Plan Note (Addendum)
 Had acute CHF exacerbation in the past Currently euvolemic On Jardiance  Followed by Cardiology

## 2023-12-13 NOTE — Assessment & Plan Note (Signed)
 BP Readings from Last 1 Encounters:  12/13/23 138/64   Well-controlled with Imdur  (for CAD/HFpEF) Counseled for compliance with the medications Advised DASH diet and moderate exercise/walking as tolerated

## 2023-12-13 NOTE — Assessment & Plan Note (Signed)
Due to COPD Followed by Pulmonology Has 3 lpm home O2 for chronic hypoxia 

## 2023-12-13 NOTE — Assessment & Plan Note (Signed)
Advised to follow low carb diet 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

## 2023-12-13 NOTE — Assessment & Plan Note (Signed)
 Followed by GI On Omeprazole  40 mg BID now, refilled for now

## 2023-12-13 NOTE — Telephone Encounter (Signed)
 Per Dr. Margrette Shield "If she shows up and her glucose is under 300 and she's asymptomatic then we are good to go".

## 2023-12-13 NOTE — Assessment & Plan Note (Signed)
Well controlled with Stiolto and as needed albuterol Followed by Pulmonology Has 3 lpm home O2 for chronic hypoxia

## 2023-12-14 ENCOUNTER — Ambulatory Visit (HOSPITAL_COMMUNITY)
Admission: RE | Admit: 2023-12-14 | Discharge: 2023-12-14 | Disposition: A | Discharge status: Home / Self Care | Attending: Internal Medicine | Admitting: Internal Medicine

## 2023-12-14 ENCOUNTER — Other Ambulatory Visit: Payer: Self-pay

## 2023-12-14 ENCOUNTER — Other Ambulatory Visit: Payer: Self-pay | Admitting: Internal Medicine

## 2023-12-14 ENCOUNTER — Encounter (HOSPITAL_COMMUNITY): Payer: Self-pay | Admitting: Internal Medicine

## 2023-12-14 ENCOUNTER — Ambulatory Visit (HOSPITAL_COMMUNITY): Admitting: Anesthesiology

## 2023-12-14 ENCOUNTER — Encounter (HOSPITAL_COMMUNITY)
Admission: RE | Disposition: A | Discharge status: Home / Self Care | Payer: Self-pay | Source: Home / Self Care | Attending: Internal Medicine

## 2023-12-14 ENCOUNTER — Ambulatory Visit: Admitting: Nurse Practitioner

## 2023-12-14 DIAGNOSIS — K2950 Unspecified chronic gastritis without bleeding: Secondary | ICD-10-CM

## 2023-12-14 DIAGNOSIS — I509 Heart failure, unspecified: Secondary | ICD-10-CM | POA: Diagnosis not present

## 2023-12-14 DIAGNOSIS — Z7984 Long term (current) use of oral hypoglycemic drugs: Secondary | ICD-10-CM | POA: Insufficient documentation

## 2023-12-14 DIAGNOSIS — Z860100 Personal history of colon polyps, unspecified: Secondary | ICD-10-CM | POA: Insufficient documentation

## 2023-12-14 DIAGNOSIS — K222 Esophageal obstruction: Secondary | ICD-10-CM

## 2023-12-14 DIAGNOSIS — R1310 Dysphagia, unspecified: Secondary | ICD-10-CM | POA: Diagnosis not present

## 2023-12-14 DIAGNOSIS — J439 Emphysema, unspecified: Secondary | ICD-10-CM | POA: Insufficient documentation

## 2023-12-14 DIAGNOSIS — Z87891 Personal history of nicotine dependence: Secondary | ICD-10-CM

## 2023-12-14 DIAGNOSIS — K529 Noninfective gastroenteritis and colitis, unspecified: Secondary | ICD-10-CM | POA: Diagnosis not present

## 2023-12-14 DIAGNOSIS — R12 Heartburn: Secondary | ICD-10-CM | POA: Insufficient documentation

## 2023-12-14 DIAGNOSIS — I110 Hypertensive heart disease with heart failure: Secondary | ICD-10-CM

## 2023-12-14 DIAGNOSIS — Z7985 Long-term (current) use of injectable non-insulin antidiabetic drugs: Secondary | ICD-10-CM | POA: Insufficient documentation

## 2023-12-14 DIAGNOSIS — I5030 Unspecified diastolic (congestive) heart failure: Secondary | ICD-10-CM | POA: Diagnosis not present

## 2023-12-14 DIAGNOSIS — R110 Nausea: Secondary | ICD-10-CM | POA: Diagnosis not present

## 2023-12-14 DIAGNOSIS — K2970 Gastritis, unspecified, without bleeding: Secondary | ICD-10-CM | POA: Diagnosis not present

## 2023-12-14 DIAGNOSIS — E119 Type 2 diabetes mellitus without complications: Secondary | ICD-10-CM | POA: Diagnosis not present

## 2023-12-14 DIAGNOSIS — R7401 Elevation of levels of liver transaminase levels: Secondary | ICD-10-CM | POA: Insufficient documentation

## 2023-12-14 DIAGNOSIS — J45909 Unspecified asthma, uncomplicated: Secondary | ICD-10-CM | POA: Diagnosis not present

## 2023-12-14 DIAGNOSIS — I209 Angina pectoris, unspecified: Secondary | ICD-10-CM | POA: Diagnosis not present

## 2023-12-14 DIAGNOSIS — F419 Anxiety disorder, unspecified: Secondary | ICD-10-CM | POA: Diagnosis not present

## 2023-12-14 DIAGNOSIS — K219 Gastro-esophageal reflux disease without esophagitis: Secondary | ICD-10-CM | POA: Insufficient documentation

## 2023-12-14 DIAGNOSIS — K2289 Other specified disease of esophagus: Secondary | ICD-10-CM | POA: Diagnosis not present

## 2023-12-14 DIAGNOSIS — Z8379 Family history of other diseases of the digestive system: Secondary | ICD-10-CM | POA: Insufficient documentation

## 2023-12-14 DIAGNOSIS — J449 Chronic obstructive pulmonary disease, unspecified: Secondary | ICD-10-CM | POA: Diagnosis not present

## 2023-12-14 HISTORY — PX: ESOPHAGOGASTRODUODENOSCOPY: SHX5428

## 2023-12-14 HISTORY — PX: ESOPHAGEAL DILATION: SHX303

## 2023-12-14 LAB — KOH PREP: KOH Prep: NONE SEEN

## 2023-12-14 LAB — GLUCOSE, CAPILLARY: Glucose-Capillary: 103 mg/dL — ABNORMAL HIGH (ref 70–99)

## 2023-12-14 SURGERY — EGD (ESOPHAGOGASTRODUODENOSCOPY)
Anesthesia: General

## 2023-12-14 MED ORDER — PROPOFOL 500 MG/50ML IV EMUL
INTRAVENOUS | Status: DC | PRN
  Administered 2023-12-14: 100 ug/kg/min via INTRAVENOUS

## 2023-12-14 MED ORDER — LACTATED RINGERS IV SOLN: INTRAVENOUS | Status: DC | PRN

## 2023-12-14 MED ORDER — LACTATED RINGERS IV SOLN: INTRAVENOUS | Status: DC

## 2023-12-14 MED ORDER — LIDOCAINE 2% (20 MG/ML) 5 ML SYRINGE
INTRAMUSCULAR | Status: DC | PRN
  Administered 2023-12-14: 80 mg via INTRAVENOUS

## 2023-12-14 NOTE — Interval H&P Note (Signed)
 History and Physical Interval Note:  12/14/2023 8:56 AM  Kayla Ryan  has presented today for surgery, with the diagnosis of dysphagia,black stool.  The various methods of treatment have been discussed with the patient and family. After consideration of risks, benefits and other options for treatment, the patient has consented to  Procedure(s) with comments: EGD (ESOPHAGOGASTRODUODENOSCOPY) (N/A) - 9:15 am, asa 3 DILATION, ESOPHAGUS (N/A) as a surgical intervention.  The patient's history has been reviewed, patient examined, no change in status, stable for surgery.  I have reviewed the patient's chart and labs.  Questions were answered to the patient's satisfaction.     Vinetta Greening

## 2023-12-14 NOTE — Discharge Instructions (Addendum)
 EGD Discharge instructions Please read the instructions outlined below and refer to this sheet in the next few weeks. These discharge instructions provide you with general information on caring for yourself after you leave the hospital. Your doctor may also give you specific instructions. While your treatment has been planned according to the most current medical practices available, unavoidable complications occasionally occur. If you have any problems or questions after discharge, please call your doctor. ACTIVITY You may resume your regular activity but move at a slower pace for the next 24 hours.  Take frequent rest periods for the next 24 hours.  Walking will help expel (get rid of) the air and reduce the bloated feeling in your abdomen.  No driving for 24 hours (because of the anesthesia (medicine) used during the test).  You may shower.  Do not sign any important legal documents or operate any machinery for 24 hours (because of the anesthesia used during the test).  NUTRITION Drink plenty of fluids.  You may resume your normal diet.  Begin with a light meal and progress to your normal diet.  Avoid alcoholic beverages for 24 hours or as instructed by your caregiver.  MEDICATIONS You may resume your normal medications unless your caregiver tells you otherwise.  WHAT YOU CAN EXPECT TODAY You may experience abdominal discomfort such as a feeling of fullness or "gas" pains.  FOLLOW-UP Your doctor will discuss the results of your test with you.  SEEK IMMEDIATE MEDICAL ATTENTION IF ANY OF THE FOLLOWING OCCUR: Excessive nausea (feeling sick to your stomach) and/or vomiting.  Severe abdominal pain and distention (swelling).  Trouble swallowing.  Temperature over 101 F (37.8 C).  Rectal bleeding or vomiting of blood.   Your EGD revealed moderate amount inflammation in your stomach.  I took biopsies of this to rule out infection with a bacteria called H. pylori.  Await pathology results, my  office will contact you.  You had a benign tightening of your ring called a Schatzki's ring.  I stretched this out today.  Hopefully this helps with feeling of food getting stuck.  I also took samples of your esophagus to rule out a yeast infection.  We will call with these results as well.  Small bowel appeared normal.  Continue on omeprazole  twice daily.  Follow-up in GI office in 8 weeks. Office will call with appointment   I hope you have a great rest of your week!  Rolando Cliche. Mordechai April, D.O. Gastroenterology and Hepatology [facility]

## 2023-12-14 NOTE — Transfer of Care (Signed)
 Immediate Anesthesia Transfer of Care Note  Patient: Kayla Ryan  Procedure(s) Performed: EGD (ESOPHAGOGASTRODUODENOSCOPY) DILATION, ESOPHAGUS  Patient Location: PACU  Anesthesia Type:MAC  Level of Consciousness: awake and alert   Airway & Oxygen  Therapy: Patient Spontanous Breathing and Patient connected to nasal cannula oxygen   Post-op Assessment: Report given to RN and Post -op Vital signs reviewed and stable  Post vital signs: Reviewed and stable  Last Vitals:  Vitals Value Taken Time  BP    Temp    Pulse    Resp    SpO2      Last Pain:  Vitals:   12/14/23 0936  TempSrc:   PainSc: 3       Patients Stated Pain Goal: 6 (12/14/23 0909)  Complications: No notable events documented.

## 2023-12-14 NOTE — Op Note (Signed)
 [facility] Patient Name: Kayla Ryan Procedure Date: 12/14/2023 9:26 AM MRN: [id number] Date of Birth: [birth year] Attending MD: Rolando Cliche. Mordechai April , [place], [id number] CSN: [id number] Age: 68 Admit Type: Outpatient Procedure:                Upper GI endoscopy Indications:              Dysphagia, Heartburn Providers:                Rolando Cliche. Mordechai April, DO, Tammy Vaught, RN, Graydon Lazier RN, RN, Alisa App, Annell Barrow Referring MD:             Rolando Cliche. Mordechai April, DO Medicines:                See the Anesthesia note for documentation of the                            administered medications Complications:            No immediate complications. Estimated Blood Loss:     Estimated blood loss was minimal. Procedure:                Pre-Anesthesia Assessment:                           - The anesthesia plan was to use monitored                            anesthesia care (MAC).                           After obtaining informed consent, the endoscope was                            passed under direct vision. Throughout the                            procedure, the patient's blood pressure, pulse, and                            oxygen  saturations were monitored continuously. The                            GIF-H190 ([id number]) scope was introduced through the                            mouth, and advanced to the second part of duodenum.                            The upper GI endoscopy was accomplished without                            difficulty. The patient tolerated the procedure  well. Scope In: 9:42:55 AM Scope Out: 9:50:33 AM Total Procedure Duration: 0 hours 7 minutes 38 seconds  Findings:      A mild Schatzki ring was found in the distal esophagus. Pill "stuck" in       this area that had diossolved with pill constents in the esophagus.       Mucosa in this area somehwat discolored with pink hue. A TTS dilator was       passed  through the scope. Dilation with an 18-19-20 mm balloon dilator       was performed to 20 mm. The dilation site was examined and showed       moderate improvement in luminal narrowing.      Scattered, yellow plaques were found in the middle third of the       esophagus. Cells for cytology were obtained by brushing.      Localized moderate inflammation characterized by erosions and erythema       was found in the gastric antrum. Biopsies were taken with a cold forceps       for Helicobacter pylori testing.      The duodenal bulb, first portion of the duodenum and second portion of       the duodenum were normal. Impression:               - Mild Schatzki ring. Dilated.                           - Esophageal plaques were found, suspicious for                            candidiasis. Cells for cytology obtained.                           - Gastritis. Biopsied.                           - Normal duodenal bulb, first portion of the                            duodenum and second portion of the duodenum. Moderate Sedation:      Per Anesthesia Care Recommendation:           - Patient has a contact number available for                            emergencies. The signs and symptoms of potential                            delayed complications were discussed with the                            patient. Return to normal activities tomorrow.                            Written discharge instructions were provided to the                            patient.                           -  Resume previous diet.                           - Continue present medications.                           - Await pathology results.                           - Repeat upper endoscopy PRN for retreatment.                           - Return to GI clinic in 8 weeks.                           - Use a proton pump inhibitor PO BID. Procedure Code(s):        --- Professional ---                           [phone],  Esophagogastroduodenoscopy, flexible,                            transoral; with transendoscopic balloon dilation of                            esophagus (less than 30 mm diameter)                           43239, 59, Esophagogastroduodenoscopy, flexible,                            transoral; with biopsy, single or multiple Diagnosis Code(s):        --- Professional ---                           K22.2, Esophageal obstruction                           K22.9, Disease of esophagus, unspecified                           K29.70, Gastritis, unspecified, without bleeding                           R13.10, Dysphagia, unspecified                           R12, Heartburn CPT copyright 2022 American Medical Association. All rights reserved. The codes documented in this report are preliminary and upon coder review may  be revised to meet current compliance requirements. Rolando Cliche. Mordechai April, DO Rolando Cliche. Mordechai April, DO 12/14/2023 9:54:57 AM This report has been signed electronically. Number of Addenda: 0

## 2023-12-14 NOTE — Anesthesia Preprocedure Evaluation (Signed)
 Anesthesia Evaluation  Patient identified by MRN, date of birth, ID band Patient awake    Reviewed: Allergy & Precautions, H&P , NPO status , Patient's Chart, lab work & pertinent test results, reviewed documented beta blocker date and time   History of Anesthesia Complications (+) PONV and history of anesthetic complications  Airway Mallampati: II  TM Distance: >3 FB Neck ROM: full    Dental no notable dental hx.    Pulmonary asthma , COPD, former smoker   Pulmonary exam normal breath sounds clear to auscultation       Cardiovascular Exercise Tolerance: Good hypertension, + angina  +CHF   Rhythm:regular Rate:Normal     Neuro/Psych  Headaches PSYCHIATRIC DISORDERS Anxiety      Neuromuscular disease    GI/Hepatic Neg liver ROS,GERD  ,,  Endo/Other  diabetes    Renal/GU Renal disease  negative genitourinary   Musculoskeletal   Abdominal   Peds  Hematology  (+) Blood dyscrasia, anemia   Anesthesia Other Findings   Reproductive/Obstetrics negative OB ROS                             Anesthesia Physical Anesthesia Plan  ASA: 3  Anesthesia Plan: General   Post-op Pain Management:    Induction:   PONV Risk Score and Plan: Propofol  infusion  Airway Management Planned:   Additional Equipment:   Intra-op Plan:   Post-operative Plan:   Informed Consent: I have reviewed the patients History and Physical, chart, labs and discussed the procedure including the risks, benefits and alternatives for the proposed anesthesia with the patient or authorized representative who has indicated his/her understanding and acceptance.     Dental Advisory Given  Plan Discussed with: CRNA  Anesthesia Plan Comments:        Anesthesia Quick Evaluation

## 2023-12-15 ENCOUNTER — Encounter (HOSPITAL_COMMUNITY): Payer: Self-pay | Admitting: Internal Medicine

## 2023-12-15 LAB — SURGICAL PATHOLOGY

## 2023-12-18 ENCOUNTER — Other Ambulatory Visit: Payer: Self-pay | Admitting: Emergency Medicine

## 2023-12-19 ENCOUNTER — Telehealth: Payer: Self-pay

## 2023-12-19 NOTE — Telephone Encounter (Signed)
 Noted  Labs are in the system, pt made aware of going to [facility]

## 2023-12-19 NOTE — Progress Notes (Signed)
 Complex Care Management Note  Care Guide Note 12/19/2023 Name: Kayla Ryan MRN: [id number] DOB: [birth year]  Kayla Ryan is a 68 y.o. year old female who sees Meldon Sport, MD for primary care. I reached out to Alcus Amabile by phone today to offer complex care management services.  [name] was given information about Complex Care Management services today including:   The Complex Care Management services include support from the care team which includes your Nurse Care Manager, Clinical Social Worker, or Pharmacist.  The Complex Care Management team is here to help remove barriers to the health concerns and goals most important to you. Complex Care Management services are voluntary, and the patient may decline or stop services at any time by request to their care team member.   Complex Care Management Consent Status: Patient agreed to services and verbal consent obtained.   Follow up plan:  Telephone appointment with complex care management team member scheduled for:  12/29/2023  Encounter Outcome:  Patient Scheduled  Lenton Rail , RMA     [place]  [facility], [facility] Med Guide  Direct Dial: [phone]  Website: [email or web address]

## 2023-12-19 NOTE — Addendum Note (Signed)
 Addended by: Feliz Hosteller on: 12/19/2023 02:44 PM   Modules accepted: Orders

## 2023-12-19 NOTE — Telephone Encounter (Signed)
 Please arrange US  abdomen complete due to elevated LFTs.  Kayla Ryan: I have also placed orders for [facility], just need to make sure it's in system.   Patient can go have labs done at [facility]. Thanks!

## 2023-12-20 ENCOUNTER — Ambulatory Visit: Admitting: Emergency Medicine

## 2023-12-21 DIAGNOSIS — J449 Chronic obstructive pulmonary disease, unspecified: Secondary | ICD-10-CM | POA: Diagnosis not present

## 2023-12-21 DIAGNOSIS — J9611 Chronic respiratory failure with hypoxia: Secondary | ICD-10-CM | POA: Diagnosis not present

## 2023-12-22 NOTE — Anesthesia Postprocedure Evaluation (Signed)
 Anesthesia Post Note  Patient: Kayla Ryan  Procedure(s) Performed: EGD (ESOPHAGOGASTRODUODENOSCOPY) DILATION, ESOPHAGUS  Patient location during evaluation: Phase II Anesthesia Type: General Level of consciousness: awake Pain management: pain level controlled Vital Signs Assessment: post-procedure vital signs reviewed and stable Respiratory status: spontaneous breathing and respiratory function stable Cardiovascular status: blood pressure returned to baseline and stable Postop Assessment: no headache and no apparent nausea or vomiting Anesthetic complications: no Comments: Late entry   No notable events documented.   Last Vitals:  Vitals:   12/14/23 0909 12/14/23 0955  BP: (!) 154/63 123/65  Pulse: 74 76  Resp: 10 17  Temp: 37.1 C 36.8 C  SpO2: 97% 97%    Last Pain:  Vitals:   12/14/23 0955  TempSrc: Oral  PainSc: 2                  Coretha Dew

## 2023-12-23 ENCOUNTER — Other Ambulatory Visit: Payer: Self-pay | Admitting: Internal Medicine

## 2023-12-23 DIAGNOSIS — E1149 Type 2 diabetes mellitus with other diabetic neurological complication: Secondary | ICD-10-CM

## 2023-12-24 ENCOUNTER — Other Ambulatory Visit: Payer: Self-pay | Admitting: Internal Medicine

## 2023-12-24 DIAGNOSIS — R42 Dizziness and giddiness: Secondary | ICD-10-CM

## 2023-12-24 DIAGNOSIS — B009 Herpesviral infection, unspecified: Secondary | ICD-10-CM

## 2023-12-25 ENCOUNTER — Ambulatory Visit: Admitting: Urology

## 2023-12-26 ENCOUNTER — Telehealth: Payer: Self-pay | Admitting: *Deleted

## 2023-12-26 NOTE — Telephone Encounter (Signed)
 Copied from CRM [id number]. Topic: Medical Record Request - Other >> Dec 21, 2023 12:16 PM Chantha C wrote: Reason for CRM: Avis Lemming from [facility] [phone] needs DWO/SWO and most recent face to face notes. Please fax# [phone]

## 2023-12-27 ENCOUNTER — Other Ambulatory Visit: Payer: Self-pay | Admitting: Internal Medicine

## 2023-12-28 NOTE — Telephone Encounter (Signed)
 Patient had last visit on 06/09/23 as video visit. [facility] will need new order placed for her 02 usage. Their call doesn't say what the order should be for

## 2023-12-29 ENCOUNTER — Telehealth: Payer: Self-pay | Admitting: *Deleted

## 2023-12-29 ENCOUNTER — Encounter: Payer: Self-pay | Admitting: *Deleted

## 2023-12-29 NOTE — Telephone Encounter (Signed)
 Called [facility] and spoke with Kayla Ryan, I advised her that her last in office was in October of 2024, her last visit with us  was a video visit, 06/08/2024.  I let her know that she has an upcomming appointment with our office in August and if a walk test is needed for re qualification we can do it at that time.  Kayla Ryan checked to see if a walk test was needed or just a face to face visit.  I was then told that she spoke with billing and her insurance is not in network with [facility] and she would need a new DME company.  PCCs, please find in network DME company for this patient, she has oxygen .  Thank you.

## 2023-12-29 NOTE — Telephone Encounter (Signed)
 Is [facility] and [employer] separate?  If so, she needs to be set up with [employer].

## 2023-12-29 NOTE — Telephone Encounter (Signed)
 Yes, [employer] and [facility] are separate companies. [name] with [employer] stated there needs to be a new order since this will be a new start.

## 2023-12-29 NOTE — Telephone Encounter (Signed)
 Spoke with [name] with [employer]. The patient's [employer] is in network with [employer].

## 2024-01-02 ENCOUNTER — Ambulatory Visit (HOSPITAL_COMMUNITY)

## 2024-01-02 ENCOUNTER — Encounter (HOSPITAL_COMMUNITY): Payer: Self-pay

## 2024-01-02 ENCOUNTER — Telehealth: Payer: Self-pay | Admitting: Adult Health

## 2024-01-02 NOTE — Telephone Encounter (Signed)
 So she has an appointment in August, does she need to be seen sooner for a walk and ONO?  Do we basically have to start over with everything?

## 2024-01-02 NOTE — Telephone Encounter (Signed)
 Cmn received from [facility] for O2 concentrator.

## 2024-01-02 NOTE — Telephone Encounter (Signed)
 LVM for Kayla Ryan. It depends on insurance. Asking for confirmation with [name].

## 2024-01-03 NOTE — Telephone Encounter (Signed)
 If the patient is a new O2 start [employer] would need RX, OV note and walk test results/Sats within 30days. If they only need nocturnal O2 we would need ONO results unless the patient has OSA and then they would need a titrated sleep study within 30days.

## 2024-01-05 ENCOUNTER — Telehealth: Payer: Self-pay

## 2024-01-05 NOTE — Telephone Encounter (Signed)
 Copied from CRM [id number]. Topic: General - Other >> Jan 05, 2024  8:58 AM Baldemar Lev wrote: Reason for CRM: Pt says her housing complex will be faxing over a document to verify the medications that she takes over the counter to verify for her re-certification for her rent.   Says that Gas X is on the list Asperderm Lidocain Melatonin Benadryl  Eye drops  Milk of Magnesia Cetirizine  Vitamin D  Mucus Relief

## 2024-01-05 NOTE — Telephone Encounter (Signed)
 Called and spoke with patient, advised of need to have OV with walk test d/t change in insurance (she needs to change from [facility] to [employer]).  She wears her oxygen  24/7, so she will need a repeat ONO as well.  Scheduled her for an appointment with Roena Clark NP on 01/29/24 at 1:30 pm, advised to arrive by 1:15 pm for check in.  She verbalized understanding.  I left her f/u in August for now.  Nothing further needed.

## 2024-01-08 ENCOUNTER — Ambulatory Visit

## 2024-01-10 ENCOUNTER — Ambulatory Visit (HOSPITAL_COMMUNITY)

## 2024-01-10 ENCOUNTER — Encounter (HOSPITAL_COMMUNITY): Payer: Self-pay

## 2024-01-11 ENCOUNTER — Telehealth: Payer: Self-pay | Admitting: Internal Medicine

## 2024-01-11 ENCOUNTER — Ambulatory Visit: Payer: Self-pay
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
## 2024-01-11 NOTE — Telephone Encounter (Signed)
 Copied from CRM [id number]. Topic: General - Phone/Fax/Address >> Jan 11, 2024  9:02 AM Antwanette L wrote: Patient is calling because [employer] is faxing over a recertificationletter for Dr. Lydia Sams to fill out. The fax was sent on 6/4 at 3:30pm. The patient can be contacted at [phone]

## 2024-01-12 NOTE — Telephone Encounter (Signed)
 So far nothing has been received. Will await form and complete once received

## 2024-01-15 NOTE — Telephone Encounter (Signed)
 CMN duplicate received 01/15/2024

## 2024-01-16 ENCOUNTER — Other Ambulatory Visit: Payer: Self-pay | Admitting: Gastroenterology

## 2024-01-16 ENCOUNTER — Encounter: Admitting: Internal Medicine

## 2024-01-17 DIAGNOSIS — M47816 Spondylosis without myelopathy or radiculopathy, lumbar region: Secondary | ICD-10-CM | POA: Diagnosis not present

## 2024-01-17 DIAGNOSIS — M797 Fibromyalgia: Secondary | ICD-10-CM | POA: Diagnosis not present

## 2024-01-17 DIAGNOSIS — Z79891 Long term (current) use of opiate analgesic: Secondary | ICD-10-CM | POA: Diagnosis not present

## 2024-01-17 DIAGNOSIS — G894 Chronic pain syndrome: Secondary | ICD-10-CM | POA: Diagnosis not present

## 2024-01-19 ENCOUNTER — Telehealth: Payer: Self-pay

## 2024-01-19 NOTE — Telephone Encounter (Signed)
 Copied from CRM [id number]. Topic: General - Other >> Jan 19, 2024 12:32 PM Emylou G wrote: Reason for CRM: Pls call patient about her HYDROcodone -acetaminophen  (NORCO) 10-325 MG tablet.Aaron Aas  She won't relay to me why she is calling.

## 2024-01-21 DIAGNOSIS — J449 Chronic obstructive pulmonary disease, unspecified: Secondary | ICD-10-CM | POA: Diagnosis not present

## 2024-01-21 DIAGNOSIS — J9611 Chronic respiratory failure with hypoxia: Secondary | ICD-10-CM | POA: Diagnosis not present

## 2024-01-22 ENCOUNTER — Ambulatory Visit: Admitting: Urology

## 2024-01-22 ENCOUNTER — Telehealth: Payer: Self-pay | Admitting: Internal Medicine

## 2024-01-22 NOTE — Telephone Encounter (Signed)
 States she contacted them last Friday however their office was closed, states she got a notification medication is ready.

## 2024-01-22 NOTE — Telephone Encounter (Signed)
 Drop off letter with list of meds for her [address].    Copied Noted Sleeved  Original placed in provider box Copy at front desk folder

## 2024-01-23 ENCOUNTER — Inpatient Hospital Stay: Attending: Hematology

## 2024-01-23 ENCOUNTER — Inpatient Hospital Stay

## 2024-01-23 DIAGNOSIS — N1832 Chronic kidney disease, stage 3b: Secondary | ICD-10-CM | POA: Diagnosis not present

## 2024-01-23 DIAGNOSIS — E782 Mixed hyperlipidemia: Secondary | ICD-10-CM | POA: Diagnosis not present

## 2024-01-23 DIAGNOSIS — I5032 Chronic diastolic (congestive) heart failure: Secondary | ICD-10-CM | POA: Diagnosis not present

## 2024-01-23 DIAGNOSIS — R42 Dizziness and giddiness: Secondary | ICD-10-CM | POA: Diagnosis not present

## 2024-01-23 DIAGNOSIS — Z91199 Patient's noncompliance with other medical treatment and regimen due to unspecified reason: Secondary | ICD-10-CM | POA: Insufficient documentation

## 2024-01-23 DIAGNOSIS — Z801 Family history of malignant neoplasm of trachea, bronchus and lung: Secondary | ICD-10-CM | POA: Diagnosis not present

## 2024-01-23 DIAGNOSIS — D75839 Thrombocytosis, unspecified: Secondary | ICD-10-CM | POA: Diagnosis not present

## 2024-01-23 DIAGNOSIS — Z87891 Personal history of nicotine dependence: Secondary | ICD-10-CM | POA: Diagnosis not present

## 2024-01-23 DIAGNOSIS — J449 Chronic obstructive pulmonary disease, unspecified: Secondary | ICD-10-CM | POA: Diagnosis not present

## 2024-01-23 DIAGNOSIS — D509 Iron deficiency anemia, unspecified: Secondary | ICD-10-CM | POA: Insufficient documentation

## 2024-01-23 DIAGNOSIS — D500 Iron deficiency anemia secondary to blood loss (chronic): Secondary | ICD-10-CM

## 2024-01-23 DIAGNOSIS — E559 Vitamin D deficiency, unspecified: Secondary | ICD-10-CM | POA: Diagnosis not present

## 2024-01-23 DIAGNOSIS — I10 Essential (primary) hypertension: Secondary | ICD-10-CM | POA: Diagnosis not present

## 2024-01-23 DIAGNOSIS — E1149 Type 2 diabetes mellitus with other diabetic neurological complication: Secondary | ICD-10-CM | POA: Diagnosis not present

## 2024-01-23 LAB — IRON AND TIBC
Iron: 98 ug/dL (ref 28–170)
Saturation Ratios: 25 % (ref 10.4–31.8)
TIBC: 395 ug/dL (ref 250–450)
UIBC: 297 ug/dL

## 2024-01-23 LAB — CBC WITH DIFFERENTIAL/PLATELET
Abs Immature Granulocytes: 0.03 10*3/uL (ref 0.00–0.07)
Basophils Absolute: 0.1 10*3/uL (ref 0.0–0.1)
Basophils Relative: 1 %
Eosinophils Absolute: 0.7 10*3/uL — ABNORMAL HIGH (ref 0.0–0.5)
Eosinophils Relative: 8 %
HCT: 42.9 % (ref 36.0–46.0)
Hemoglobin: 13.6 g/dL (ref 12.0–15.0)
Immature Granulocytes: 0 %
Lymphocytes Relative: 32 %
Lymphs Abs: 2.6 10*3/uL (ref 0.7–4.0)
MCH: 30.5 pg (ref 26.0–34.0)
MCHC: 31.7 g/dL (ref 30.0–36.0)
MCV: 96.2 fL (ref 80.0–100.0)
Monocytes Absolute: 0.6 10*3/uL (ref 0.1–1.0)
Monocytes Relative: 7 %
Neutro Abs: 4.2 10*3/uL (ref 1.7–7.7)
Neutrophils Relative %: 52 %
Platelets: 459 10*3/uL — ABNORMAL HIGH (ref 150–400)
RBC: 4.46 MIL/uL (ref 3.87–5.11)
RDW: 13.2 % (ref 11.5–15.5)
WBC: 8.1 10*3/uL (ref 4.0–10.5)
nRBC: 0 % (ref 0.0–0.2)

## 2024-01-23 LAB — FERRITIN: Ferritin: 67 ng/mL (ref 11–307)

## 2024-01-24 DIAGNOSIS — M19042 Primary osteoarthritis, left hand: Secondary | ICD-10-CM | POA: Diagnosis not present

## 2024-01-24 DIAGNOSIS — G8929 Other chronic pain: Secondary | ICD-10-CM | POA: Diagnosis not present

## 2024-01-24 DIAGNOSIS — M19041 Primary osteoarthritis, right hand: Secondary | ICD-10-CM | POA: Diagnosis not present

## 2024-01-24 DIAGNOSIS — M1811 Unilateral primary osteoarthritis of first carpometacarpal joint, right hand: Secondary | ICD-10-CM | POA: Diagnosis not present

## 2024-01-24 DIAGNOSIS — M65311 Trigger thumb, right thumb: Secondary | ICD-10-CM | POA: Diagnosis not present

## 2024-01-24 LAB — CBC WITH DIFFERENTIAL/PLATELET
Basophils Absolute: 0.1 10*3/uL (ref 0.0–0.2)
Basos: 1 %
EOS (ABSOLUTE): 0.5 10*3/uL — ABNORMAL HIGH (ref 0.0–0.4)
Eos: 7 %
Hematocrit: 43.3 % (ref 34.0–46.6)
Hemoglobin: 13.8 g/dL (ref 11.1–15.9)
Immature Grans (Abs): 0 10*3/uL (ref 0.0–0.1)
Immature Granulocytes: 0 %
Lymphocytes Absolute: 2 10*3/uL (ref 0.7–3.1)
Lymphs: 30 %
MCH: 29.4 pg (ref 26.6–33.0)
MCHC: 31.9 g/dL (ref 31.5–35.7)
MCV: 92 fL (ref 79–97)
Monocytes Absolute: 0.5 10*3/uL (ref 0.1–0.9)
Monocytes: 7 %
Neutrophils Absolute: 3.6 10*3/uL (ref 1.4–7.0)
Neutrophils: 54 %
Platelets: 438 10*3/uL (ref 150–450)
RBC: 4.7 x10E6/uL (ref 3.77–5.28)
RDW: 13.1 % (ref 11.7–15.4)
WBC: 6.6 10*3/uL (ref 3.4–10.8)

## 2024-01-24 LAB — CMP14+EGFR
ALT: 79 IU/L — ABNORMAL HIGH (ref 0–32)
AST: 54 IU/L — ABNORMAL HIGH (ref 0–40)
Albumin: 4.8 g/dL (ref 3.9–4.9)
Alkaline Phosphatase: 120 IU/L (ref 44–121)
BUN/Creatinine Ratio: 33 — ABNORMAL HIGH (ref 12–28)
BUN: 26 mg/dL (ref 8–27)
Bilirubin Total: 0.3 mg/dL (ref 0.0–1.2)
CO2: 27 mmol/L (ref 20–29)
Calcium: 9.7 mg/dL (ref 8.7–10.3)
Chloride: 95 mmol/L — ABNORMAL LOW (ref 96–106)
Creatinine, Ser: 0.78 mg/dL (ref 0.57–1.00)
Globulin, Total: 2.8 g/dL (ref 1.5–4.5)
Glucose: 77 mg/dL (ref 70–99)
Potassium: 4.6 mmol/L (ref 3.5–5.2)
Sodium: 138 mmol/L (ref 134–144)
Total Protein: 7.6 g/dL (ref 6.0–8.5)
eGFR: 82 mL/min/{1.73_m2} (ref >59)

## 2024-01-24 LAB — HEMOGLOBIN A1C
Est. average glucose Bld gHb Est-mCnc: 108 mg/dL
Hgb A1c MFr Bld: 5.4 % (ref 4.8–5.6)

## 2024-01-24 LAB — TSH: TSH: 6.82 u[IU]/mL — ABNORMAL HIGH (ref 0.450–4.500)

## 2024-01-24 LAB — LIPID PANEL
Chol/HDL Ratio: 5.8 ratio — ABNORMAL HIGH (ref 0.0–4.4)
Cholesterol, Total: 202 mg/dL — ABNORMAL HIGH (ref 100–199)
HDL: 35 mg/dL — ABNORMAL LOW (ref >39)
LDL Chol Calc (NIH): 82 mg/dL (ref 0–99)
Triglycerides: 529 mg/dL — ABNORMAL HIGH (ref 0–149)
VLDL Cholesterol Cal: 85 mg/dL — ABNORMAL HIGH (ref 5–40)

## 2024-01-24 LAB — VITAMIN D 25 HYDROXY (VIT D DEFICIENCY, FRACTURES): Vit D, 25-Hydroxy: 17.4 ng/mL — ABNORMAL LOW (ref 30.0–100.0)

## 2024-01-24 NOTE — Telephone Encounter (Signed)
 Rc'd signed copy of CMN. Will fax with OV notes to [phone]. Once confirmed will attach and send to scan.

## 2024-01-28 ENCOUNTER — Other Ambulatory Visit: Payer: Self-pay | Admitting: Internal Medicine

## 2024-01-28 DIAGNOSIS — M5030 Other cervical disc degeneration, unspecified cervical region: Secondary | ICD-10-CM

## 2024-01-29 ENCOUNTER — Ambulatory Visit: Admitting: Adult Health

## 2024-01-29 NOTE — Telephone Encounter (Signed)
 Patient calling again says she is at risk of losing her housing and is needing to speak with someone she's very anxious. Please advise Thank you

## 2024-01-29 NOTE — Telephone Encounter (Signed)
 Copied from CRM [id number]. Topic: General - Other >> Jan 29, 2024 12:33 PM Essie A wrote: Reason for CRM: Patient is calling looking for paperwork response that was dropped off at the office regarding list of medications.  Please return her call [phone].

## 2024-01-29 NOTE — Progress Notes (Unsigned)
 VIRTUAL VISIT via TELEPHONE NOTE [facility]   I connected with Kayla Ryan  on 01/30/2024 at 3:30 PM by telephone and verified that I am speaking with the correct person using two identifiers.  Location: Patient: Home Provider: [facility]   I discussed the limitations, risks, security and privacy concerns of performing an evaluation and management service by telephone and the availability of in person appointments. I also discussed with the patient that there may be a patient responsible charge related to this service. The patient expressed understanding and agreed to proceed.  REASON FOR VISIT:  Follow-up for iron  deficiency anemia   CURRENT THERAPY: IV iron  infusions   INTERVAL HISTORY:  Kayla Ryan is contacted today for follow-up of iron  deficiency anemia,.  She was last evaluated via telemedicine visit by Pleasant Barefoot PA-C on 10/30/2023.  Overall, at today's visit she reports feeling fairly well. She reports relatively good energy.    She continues to have issues with chronic diarrhea. She has not noticed melanotic (coffee-ground diarrhea) stool for the past 1 to 2 months. She denies any bright red blood per rectum. Her ice pica resolved. Her headaches and lightheadedness improved, but she does have some vertigo related to inner ear. Other symptoms as reported in ROS below.   She has % energy and 100% appetite. She endorses that she is maintaining a stable weight.  ASSESSMENT & PLAN:  1.  Normocytic anemia: - Etiology chronic GI blood loss, CKD stage IIIb, and iron  deficiency.  - Hospitalized in November 2022 with Hgb 5.8, s/p 4 units PRBC - Most recent blood transfusion was in January 2023. - Colonoscopy and EGD (04/30/2021) showed no obvious source of blood loss. - Capsule study (09/22/2021): Few gastric erosions, nonbleeding.  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 - She cannot take oral iron  due to  allergies (tongue swells, rash, loin pain).  High intolerance of IV iron  with significant side effects (see below). - Most recent IV iron  with Ferrlecit x 3 in December 2024.  (See below) - Apart from the side effects of the iron  itself, she reports that she felt improved energy after her IV iron . - She has black tarry stools on occasion (last episode 1 to 2 months ago) - Most recent labs (01/23/2024): Hgb 13.6/MCV 96.2, ferritin 67, iron  saturation 25% with TIBC 395.  (Thrombocytosis with platelets 459 despite adequate iron  levels is noted, will monitor and consider MPN workup if persistent) - PLAN: Since she is asymptomatic and without anemia, we will hold off on any IV iron  even though ferritin is <100.  - Labs and RTC in 4 months - will follow platelets and consider MPN workup if they remain elevated - Patient advised to continue close follow-up with GI   2.  Intolerance of IV iron  infusions  - Reports previous reactions to iron  infusions with Venofer  (severe nausea, rash, itching, myalgias). She has received IV Venofer  100 mg every 5 days in [facility] under the direction of Dr. Madison.  (Per notes by Dr. Madison from 2020, patient was receiving monthly Venofer  as maintenance.)  She is also unable to tolerate steroid premedications due to agitation and mood changes. - Patient experienced severe low back pain, nausea, vomiting, and diarrhea after IV Venofer  received in July/August 2023 - Trial of Feraheme  in November/December 2023, due to more favorable adverse event profile Low-dose Feraheme  (90 mg) on 06/15/2022, and reported diarrhea and nausea for the next several days.  Full dose Feraheme  (510 mg) on 08/04/2022, and called the next day to report severe diarrhea and rash. Feraheme  (510 mg) x 2  on 03/24/2023 and 04/03/2023.  Called on 04/05/2023 to report severe nausea, diarrhea, abdominal distention, rash across her abdomen, severe leg cramps extending into her thighs, and difficulty swallowing.   Advised to go to ED, but declined to do so.   -Trial of Ferrlecit in December 2024, but unable to tolerate due to diffuse pruritus (no rash) experienced during infusion, despite premedicating with IV Solu-Medrol . - Premedication including IV Solu-Medrol , IV Pepcid , IV cetirizine , and Tylenol .  (Patient previously requested to avoid IV steroids due to side effects of mood swings and aggression, but she has been unable to tolerate iron  infusions without steroids.) - Also requires antiemetic prior to infusions. Zofran  and dicyclomine  improved her nausea and diarrhea after iron  infusions. Has at times required prn anxiolytic. - PLAN: Overall, patient feels that she tolerated Feraheme  the best, as she denies any hypersensitivity symptoms during infusion, but did have some reported diarrhea and nausea following Feraheme  infusions.     3.  Noncompliance / barriers to care - Patient has had frequent cancellations of her IV iron  - Patient cites reason for cancellations as being severe chronic diarrhea related to her IBS, somewhat relieved with Bentyl  and Imodium, following with GI.  Diarrhea tends to be better in the afternoons, therefore she prefers afternoon appointments for IV iron . 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 - After appointment and discussion of the above in August 2024, patient was apologetic.  Patient has exhibited significant effort at compliance and has done much better attending her appointments and treatments since that time. - Assessed by clinical social worker on 07/13/2023 - Patient given information on local therapists who may be able to assist with generalized anxiety and processing trauma    4. Other history - PMH: CKD stage IIIb.  COPD with chronic hypoxic respiratory failure (on 3 L supplemental oxygen  continuously), dermatomyositis/muscular dystrophy, GERD, history of Crohn's disease but with recent EGD/colonoscopy negative for  any signs of Crohn's. - She is widowed.  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  Quit smoking 20+ years ago. - Mother had lung cancer and father had lung cancer.  Niece had melanoma.   PLAN SUMMARY:  >> Labs (CBC/D, ferritin, iron /TIBC) in 4 months >> OFFICE visit in 4 months (after labs)   ** Last office visit 03/20/23     REVIEW OF SYSTEMS:   Review of Systems  Constitutional:  Negative for chills, diaphoresis, fever, malaise/fatigue and weight loss.  HENT:         Difficulty chewing and swallowing due to dentures  Respiratory:  Positive for cough and shortness of breath (COPD).   Cardiovascular:  Negative for chest pain and palpitations.  Gastrointestinal:  Positive for constipation, diarrhea, nausea and vomiting. Negative for abdominal pain, blood in stool and melena.  Genitourinary:  Negative for frequency.  Musculoskeletal:  Positive for joint pain.  Neurological:  Positive for dizziness and tingling. Negative for headaches.  Psychiatric/Behavioral:  Negative for depression.      PHYSICAL EXAM: (per limitations of virtual telephone visit)  The patient is alert and oriented x 3, exhibiting adequate mentation, good mood, and ability to speak in full sentences and execute sound judgement.  WRAP UP:   I discussed the assessment and treatment plan with the patient. The patient  was provided an opportunity to ask questions and all were answered. The patient agreed with the plan and demonstrated an understanding of the instructions.   The patient was advised to call back or seek an in-person evaluation if the symptoms worsen or if the condition fails to improve as anticipated.  I provided 22 minutes of non-face-to-face time during this encounter, including > 10 minutes of medical discussion.  Pleasant CHRISTELLA Barefoot, PA-C 01/30/24 4:53 PM

## 2024-01-30 ENCOUNTER — Other Ambulatory Visit: Payer: Self-pay | Admitting: Gastroenterology

## 2024-01-30 ENCOUNTER — Other Ambulatory Visit: Payer: Self-pay | Admitting: Internal Medicine

## 2024-01-30 ENCOUNTER — Inpatient Hospital Stay: Admitting: Physician Assistant

## 2024-01-30 DIAGNOSIS — D500 Iron deficiency anemia secondary to blood loss (chronic): Secondary | ICD-10-CM | POA: Diagnosis not present

## 2024-01-30 DIAGNOSIS — N1832 Chronic kidney disease, stage 3b: Secondary | ICD-10-CM

## 2024-01-30 DIAGNOSIS — Z91199 Patient's noncompliance with other medical treatment and regimen due to unspecified reason: Secondary | ICD-10-CM

## 2024-01-30 DIAGNOSIS — K922 Gastrointestinal hemorrhage, unspecified: Secondary | ICD-10-CM | POA: Diagnosis not present

## 2024-01-30 DIAGNOSIS — I5032 Chronic diastolic (congestive) heart failure: Secondary | ICD-10-CM

## 2024-01-30 NOTE — Telephone Encounter (Signed)
 Is this Xifaxan  a refill from when last seen, or did she not ever take the first round? We don't need to refill it if she has already taken it.

## 2024-01-30 NOTE — Telephone Encounter (Signed)
Phoned and LMOVM for the pt to return call 

## 2024-01-30 NOTE — Telephone Encounter (Signed)
 Pt returned call and was advised by here she has already taken it and she did not call in for a refill. Pt was advised if she has any other issues with diarrhea to call us  first. Pt agreed

## 2024-01-31 ENCOUNTER — Telehealth: Payer: Self-pay

## 2024-01-31 NOTE — Telephone Encounter (Signed)
 Filled out and refaxed to office

## 2024-01-31 NOTE — Telephone Encounter (Signed)
 Copied from CRM [id number]. Topic: General - Other >> Jan 29, 2024 12:33 PM Essie A wrote: Reason for CRM: Patient is calling looking for paperwork response that was dropped off at the office regarding list of medications.  Please return her call [phone].

## 2024-01-31 NOTE — Telephone Encounter (Signed)
 Spoke to patient, advised that the form was faxed this morning

## 2024-02-06 ENCOUNTER — Encounter: Payer: Self-pay | Admitting: Internal Medicine

## 2024-02-06 ENCOUNTER — Ambulatory Visit: Admitting: Internal Medicine

## 2024-02-06 VITALS — BP 119/64 | HR 111 | Ht 68.0 in | Wt 163.6 lb

## 2024-02-06 DIAGNOSIS — R7989 Other specified abnormal findings of blood chemistry: Secondary | ICD-10-CM

## 2024-02-06 DIAGNOSIS — E782 Mixed hyperlipidemia: Secondary | ICD-10-CM | POA: Diagnosis not present

## 2024-02-06 DIAGNOSIS — J9611 Chronic respiratory failure with hypoxia: Secondary | ICD-10-CM | POA: Diagnosis not present

## 2024-02-06 DIAGNOSIS — J449 Chronic obstructive pulmonary disease, unspecified: Secondary | ICD-10-CM | POA: Diagnosis not present

## 2024-02-06 DIAGNOSIS — S0993XA Unspecified injury of face, initial encounter: Secondary | ICD-10-CM | POA: Insufficient documentation

## 2024-02-06 DIAGNOSIS — M25561 Pain in right knee: Secondary | ICD-10-CM

## 2024-02-06 DIAGNOSIS — I10 Essential (primary) hypertension: Secondary | ICD-10-CM

## 2024-02-06 DIAGNOSIS — E1149 Type 2 diabetes mellitus with other diabetic neurological complication: Secondary | ICD-10-CM

## 2024-02-06 DIAGNOSIS — E559 Vitamin D deficiency, unspecified: Secondary | ICD-10-CM

## 2024-02-06 DIAGNOSIS — Z7985 Long-term (current) use of injectable non-insulin antidiabetic drugs: Secondary | ICD-10-CM | POA: Diagnosis not present

## 2024-02-06 MED ORDER — VITAMIN D (ERGOCALCIFEROL) 1.25 MG (50000 UNIT) PO CAPS: 50000.0000 [IU] | ORAL_CAPSULE | ORAL | 1 refills | Status: DC

## 2024-02-06 NOTE — Assessment & Plan Note (Signed)
 Likely mechanical fall leading to facial injury Has right-sided facial swelling, no injury over the eye Check x-ray of facial bones Advised to apply cool compresses for swelling

## 2024-02-06 NOTE — Assessment & Plan Note (Addendum)
 Lab Results  Component Value Date   HGBA1C 5.4 01/23/2024   Well controlled usually, had hyperglycemia around EGD due to not being able to take Mounjaro  On Mounjaro  5 mg qw Has had episodes of hypoglycemia (<50) in the past, has CGM now, no recent episode of hypoglycemia  On Jardiance  for HFpEF (did not tolerate Farxiga  10 mg) Advised to follow diabetic diet Has statin intolerance F/u CMP and lipid panel Diabetic eye exam: Advised to follow up with Ophthalmology for diabetic eye exam  Takes gabapentin  800 mg TID, she has noticed improvement in numbness and tingling of bilateral LE

## 2024-02-06 NOTE — Patient Instructions (Addendum)
 Please start taking Vitamin  D 50,000 IU once weekly.  Please get x-ray of facial bones done at [facility]  Please continue to take medications as prescribed.  Please continue to follow low carb diet and perform moderate exercise/walking at least 150 mins/week.  Please get fasting blood tests done before the next visit.

## 2024-02-06 NOTE — Assessment & Plan Note (Signed)
 Last vitamin D  Lab Results  Component Value Date   VD25OH 17.4 (L) 01/23/2024   Started Vitamin D  50,000 IU QW

## 2024-02-06 NOTE — Assessment & Plan Note (Signed)
Due to COPD Followed by Pulmonology Has 3 lpm home O2 for chronic hypoxia 

## 2024-02-06 NOTE — Assessment & Plan Note (Signed)
 BP Readings from Last 1 Encounters:  02/06/24 119/64   Well-controlled with Imdur  (for CAD/HFpEF) Counseled for compliance with the medications Advised DASH diet and moderate exercise/walking as tolerated

## 2024-02-06 NOTE — Assessment & Plan Note (Signed)
 Lab Results  Component Value Date   TSH 6.820 (H) 01/23/2024   Recheck TSH with free T4

## 2024-02-06 NOTE — Progress Notes (Addendum)
 Established Patient Office Visit  Subjective:  Patient ID: Kayla Ryan, female    DOB: October 05, 1955  Age: 68 y.o. MRN: [id number]  CC:  Chief Complaint  Patient presents with   Diabetes    Follow up   Fall    Reports multiple falls reports nose and some facial pain, last fall was 4 weeks ago.     HPI Kayla Ryan is a 68 y.o. female with past medical history of HTN, HFpEF, COPD, GERD, type II DM with neuropathy, CKD, GAD and chronic pain syndrome who presents for f/u of her chronic medical conditions and recent falls.  Type II DM: She has been taking Mounjaro , and has been tolerating it well.  Her blood glucose has improved now. She has history of hypoglycemia less than 50 in the past, but denies recent episode. She is tolerating Jardiance  now.  She has chronic numbness of bilateral foot.  She takes gabapentin  800 mg TID currently.  Denies any polyuria or polyphagia.  She has chronic fatigue.  GAD: She takes Ativan  0.5 mg 3 times daily as needed. She is also on Cymbalta  60 mg BID.   She has history of COPD and chronic hypoxic respiratory failure.  She has acute Stiolto and as needed albuterol .  She uses 3 LPM O2 at home.  She has chronic low back pain, followed by pain clinic.  She takes Cymbalta  60 mg twice daily, gabapentin  800 mg 3 times daily and Norco 10-325 mg q4h PRN. She also has Robaxin  as needed for muscle spasms. Denies saddle anesthesia, urinary or stool incontinence.  She has history of AVM of small bowel, which had led to chronic GI bleeding in the past.  Denies hematemesis or hematochezia.  She takes omeprazole  40 mg BID for GERD.  She has chronic nausea, planned to get EGD tomorrow.  She reports 2 falls in the last 2 months, which were due to oxygen  tubing getting in her way while walking at home.  Denied any prodromal symptoms prior to the fall.  She hit her face during both the falls.  She has had nasal and right-sided facial pain with mild right-sided facial  swelling since the last fall about 4 weeks ago.  She did not lose consciousness.  Did not go to ER at that time.  She has had intermittent epistaxis since the last fall.  Of note, she has history of chronic sinusitis and has had rhinoplasty in the past.  Past Medical History:  Diagnosis Date   Allergy Unknown   Anemia    Anxiety 1883   Arthritis    Asthma 1980   CHF (congestive heart failure) (HCC) 2021   Chronic bronchitis    Crohn disease (HCC)    Emphysema    Emphysema of lung (HCC) 2003   Fibromyalgia    GERD (gastroesophageal reflux disease)    GI bleed    Herpes    History of blood transfusion    Hyperlipidemia    Hypertension 2015   IBS (irritable bowel syndrome)    Kidney failure    Migraines    Muscular dystrophy (HCC)    Neck pain    Oxygen  deficiency 2021   PAF (paroxysmal atrial fibrillation) (HCC)    Not anticoagulated with history of severe GI bleeding   Plantar fasciitis    Polymyositis (HCC)    PONV (postoperative nausea and vomiting)    Right thyroid  nodule 04/07/2022   Scoliosis    Type 2 diabetes mellitus (HCC)  Past Surgical History:  Procedure Laterality Date   ABDOMINAL HYSTERECTOMY     AGILE CAPSULE N/A 06/24/2021   Procedure: AGILE CAPSULE;  Surgeon: Cindie Carlin POUR, DO;  Location: [facility];  Service: Endoscopy;  Laterality: N/A;   AGILE CAPSULE N/A 07/22/2021   Procedure: AGILE CAPSULE;  Surgeon: Shaaron Lamar HERO, MD;  Location: [facility];  Service: Endoscopy;  Laterality: N/A;  7:30am   bone spur     BREAST SURGERY  1995   CHOLECYSTECTOMY     COLON SURGERY  1995   COLONOSCOPY WITH PROPOFOL  N/A 04/30/2021   Procedure: COLONOSCOPY WITH PROPOFOL ;  Surgeon: Shaaron Lamar HERO, MD;  [facility]: [facility];  Service: Endoscopy;  Laterality: N/A;   COSMETIC SURGERY  1980   ESOPHAGEAL DILATION N/A 04/30/2021   Procedure: ESOPHAGEAL DILATION;  Surgeon: Shaaron Lamar HERO, MD;  [facility]: [facility];  Service: Endoscopy;  Laterality: N/A;    ESOPHAGEAL DILATION N/A 12/14/2023   Procedure: DILATION, ESOPHAGUS;  Surgeon: Cindie Carlin POUR, DO;  [facility]: [facility];  Service: Endoscopy;  Laterality: N/A;   ESOPHAGOGASTRODUODENOSCOPY N/A 12/14/2023   Procedure: EGD (ESOPHAGOGASTRODUODENOSCOPY);  Surgeon: Cindie Carlin POUR, DO;  [facility]: [facility];  Service: Endoscopy;  Laterality: N/A;  9:15 am, asa 3   ESOPHAGOGASTRODUODENOSCOPY (EGD) WITH PROPOFOL  N/A 04/30/2021   Procedure: ESOPHAGOGASTRODUODENOSCOPY (EGD) WITH PROPOFOL ;  Surgeon: Shaaron Lamar HERO, MD;  [facility]: [facility];  Service: Endoscopy;  Laterality: N/A;   GIVENS CAPSULE STUDY N/A 09/22/2021   Procedure: GIVENS CAPSULE STUDY;  Surgeon: Shaaron Lamar HERO, MD;  [facility]: [facility];  Service: Endoscopy;  Laterality: N/A;  7:30am   HERNIA REPAIR     LEFT HEART CATHETERIZATION WITH CORONARY ANGIOGRAM N/A 11/07/2013   Procedure: LEFT HEART CATHETERIZATION WITH CORONARY ANGIOGRAM;  Surgeon: Dorn JINNY Lesches, MD;  Location: [facility];  Service: Cardiovascular;  Laterality: N/A;   MASTECTOMY PARTIAL / LUMPECTOMY     OOPHORECTOMY     ROTATOR CUFF REPAIR     TOOTH EXTRACTION N/A 04/15/2022   Procedure: DENTAL RESTORATION/EXTRACTIONS;  Surgeon: Sheryle Hamilton, DMD;  Location: [facility];  Service: Oral Surgery;  Laterality: N/A;   TOOTH EXTRACTION N/A 09/15/2023   Procedure: DENTAL RESTORATION/EXTRACTIONS;  Surgeon: Sheryle Hamilton, DMD;  Location: [facility];  Service: Oral Surgery;  Laterality: N/A;    Family History  Problem Relation Age of Onset   Lung cancer Mother    Cancer Mother    Miscarriages / Stillbirths Mother    Varicose Veins Mother    Hypertension Mother    Anxiety disorder Mother    Depression Mother    COPD Father    Lung cancer Father    Lymphoma Father    Alcohol  abuse Father    Arthritis Father    Cancer Father    Anxiety disorder Sister    Anxiety disorder Sister    Intellectual disability Sister    Depression Brother    Inflammatory bowel  disease Neg Hx    Colon cancer Neg Hx     Social History   Socioeconomic History   Marital status: Widowed    Spouse name: Not on file   Number of children: Not on file   Years of education: Not on file   Highest education level: Some college, no degree  Occupational History   Not on file  Tobacco Use   Smoking status: Former    Current packs/day: 0.00    Average packs/day: 1 pack/day for 49.0 years (  49.0 ttl pk-yrs)    Types: Cigarettes    Start date: 08/08/1966    Quit date: 08/31/2005    Years since quitting: 18.6   Smokeless tobacco: Never   Tobacco comments:    It has been 16 years since I smoked a cigarette!!!  Vaping Use   Vaping status: Never Used  Substance and Sexual Activity   Alcohol  use: No   Drug use: No    Comment: used marijuana in teens   Sexual activity: Not Currently    Birth control/protection: Abstinence, Surgical  Other Topics Concern   Not on file  Social History Narrative   Are you right handed or left handed? Left Handed    Are you currently employed ? Retired    What is your current occupation?   Do you live at home alone? Yes   Who lives with you?    What type of home do you live in: 1 story or 2 story? Lives in a one story home        Social Drivers of Health   Financial Resource Strain: Medium Risk (04/23/2024)   Overall Financial Resource Strain (CARDIA)    Difficulty of Paying Living Expenses: Somewhat hard  Food Insecurity: Food Insecurity Present (04/23/2024)   Hunger Vital Sign    Worried About Running Out of Food in the Last Year: Sometimes true    Ran Out of Food in the Last Year: Sometimes true  Transportation Needs: No Transportation Needs (04/23/2024)   PRAPARE - Administrator, Civil Service (Medical): No    Lack of Transportation (Non-Medical): No  Physical Activity: Patient Declined (04/23/2024)   Exercise Vital Sign    Days of Exercise per Week: Patient declined    Minutes of Exercise per Session: Patient  declined  Stress: No Stress Concern Present (04/23/2024)   [employer] of Occupational Health - Occupational Stress Questionnaire    Feeling of Stress: Not at all  Social Connections: Unknown (04/23/2024)   Social Connection and Isolation Panel    Frequency of Communication with Friends and Family: More than three times a week    Frequency of Social Gatherings with Friends and Family: Patient declined    Attends Religious Services: Patient declined    Database Administrator or Organizations: Patient declined    Attends Banker Meetings: Patient declined    Marital Status: Widowed  Intimate Partner Violence: Not At Risk (04/23/2024)   Humiliation, Afraid, Rape, and Kick questionnaire    Fear of Current or Ex-Partner: No    Emotionally Abused: No    Physically Abused: No    Sexually Abused: No    Outpatient Medications Prior to Visit  Medication Sig Dispense Refill   ACCU-CHEK GUIDE test strip USE TO CHECK BLOOD GLUCOSE THREE TIMES DAILY, MORNING, AT NOON, AND AT BEDTIME 100 strip 0   Accu-Chek Softclix Lancets lancets USE TO TEST BLOOD SUGAR EVERY MORNING, AT NOON AND EVERY NIGHT AT BEDTIME 100 each 0   acyclovir  (ZOVIRAX ) 400 MG tablet TAKE 1 TABLET(400 MG) BY MOUTH THREE TIMES DAILY (Patient taking differently: 400 mg 2 (two) times daily.) 90 tablet 3   albuterol  (PROVENTIL ) (2.5 MG/3ML) 0.083% nebulizer solution Take 3 mLs (2.5 mg total) by nebulization every 6 (six) hours as needed for wheezing or shortness of breath. 75 mL 5   ASPERCREME LIDOCAINE  EX Apply 1 Application topically 3 (three) times daily as needed (pain).     cetirizine  (ZYRTEC ) 10 MG tablet Take 10  mg by mouth 2 (two) times daily.     Continuous Glucose Receiver (DEXCOM G7 RECEIVER) DEVI USE TO CHECK BLOOD GLUCOSE AS NEEDED 1 each 0   dicyclomine  (BENTYL ) 10 MG capsule Take 1 capsule (10 mg total) by mouth 4 (four) times daily -  before meals and at bedtime. Monitor for constipation, dry mouth,  dizziness (Patient not taking: Reported on 04/23/2024) 120 capsule 3   diphenhydrAMINE  (BENADRYL ) 25 MG tablet Take 50 mg by mouth daily as needed for allergies or itching.     DULoxetine  (CYMBALTA ) 60 MG capsule TAKE 1 CAPSULE(60 MG) BY MOUTH TWICE DAILY 60 capsule 3   Glucagon  (GVOKE HYPOPEN  2-PACK) 1 MG/0.2ML SOAJ Inject 0.2 mLs into the skin as needed (Blood glucose < 53). 0.4 mL 5   Glycerin-Hypromellose-PEG 400 (DRY EYE RELIEF DROPS OP) Place 1 drop into both eyes 3 (three) times daily as needed (Dry eye).     HYDROcodone -acetaminophen  (NORCO) 10-325 MG tablet Take 1 tablet by mouth every 4 (four) hours as needed. Stop Hydrocodone  7.5/325mg  tablet. 180 tablet 0   ipratropium (ATROVENT  HFA) 17 MCG/ACT inhaler Inhale 2 puffs into the lungs every 4 (four) hours as needed (COPD). 1 each 4   JARDIANCE  10 MG TABS tablet TAKE 1 TABLET(10 MG) BY MOUTH DAILY BEFORE BREAKFAST 30 tablet 5   Lancets Misc. (ACCU-CHEK SOFTCLIX LANCET DEV) KIT USE TO CHECK GLUCOSE THREE TIMES DAILY 1 kit 0   Magnesium  Hydroxide (MILK OF MAGNESIA PO) Take by mouth. Takes as needed     Misc. Devices KIT Henmnii rollator walker 1 kit 0   MOUNJARO  5 MG/0.5ML Pen ADMINISTER 5 MG UNDER THE SKIN 1 TIME A WEEK 6 mL 1   nitroGLYCERIN  (NITROSTAT ) 0.4 MG SL tablet Place 1 tablet (0.4 mg total) under the tongue every 5 (five) minutes as needed for chest pain. 90 tablet 3   omeprazole  (PRILOSEC) 40 MG capsule Take 1 capsule (40 mg total) by mouth 2 (two) times daily. 180 capsule 3   ondansetron  (ZOFRAN -ODT) 8 MG disintegrating tablet DISSOLVE 1 TABLET EVERY 8 HOURS AS NEEDED FOR FOR NAUSEA AND VOMITING 257 tablet 1   STIOLTO RESPIMAT  2.5-2.5 MCG/ACT AERS INHALE 2 PUFFS INTO THE LUNGS DAILY 4 g 11   XIFAXAN  550 MG TABS tablet TAKE 1 TABLET(550 MG) BY MOUTH THREE TIMES DAILY FOR 14 DAYS (Patient not taking: Reported on 04/23/2024) 42 tablet 0   Continuous Glucose Sensor (DEXCOM G7 SENSOR) MISC USE TO CHECK BLOOD SUGAR THREE TIMES DAILY,  BEFORE MEALS AND AT BEDTIME AND AS NEEDED. CHANGE SENSOR EVERY 10 DAYS 3 each 5   doxepin  (SINEQUAN ) 10 MG capsule TAKE 1 CAPSULE(10 MG) BY MOUTH AT BEDTIME 30 capsule 3   gabapentin  (NEURONTIN ) 800 MG tablet TAKE 1 TABLET(800 MG) BY MOUTH THREE TIMES DAILY (Patient taking differently: Take 800 mg by mouth 2 (two) times daily.) 90 tablet 5   isosorbide  dinitrate (ISORDIL ) 30 MG tablet TAKE 1 TABLET(30 MG) BY MOUTH DAILY 90 tablet 1   LORazepam  (ATIVAN ) 0.5 MG tablet Take 1 tablet (0.5 mg total) by mouth 3 (three) times daily as needed for anxiety. 90 tablet 3   meclizine  (ANTIVERT ) 25 MG tablet TAKE 1 TABLET(25 MG) BY MOUTH THREE TIMES DAILY AS NEEDED FOR DIZZINESS 30 tablet 1   methocarbamol  (ROBAXIN ) 500 MG tablet TAKE 1 TABLET(500 MG) BY MOUTH EVERY 8 HOURS AS NEEDED FOR MUSCLE SPASMS 30 tablet 1   montelukast  (SINGULAIR ) 10 MG tablet TAKE 1 TABLET(10 MG) BY MOUTH AT BEDTIME  30 tablet 2   No facility-administered medications prior to visit.    Allergies  Allergen Reactions   Sulfa Antibiotics Shortness Of Breath, Swelling and Rash   Chicken Allergy Diarrhea and Nausea And Vomiting   Clindamycin /Lincomycin Itching and Nausea And Vomiting   Dilaudid  [Hydromorphone  Hcl]     Makes me crazy   Ferrlecit [Na Ferric Gluc Cplx In Sucrose] Itching    Itching and throat tightness   Iron      Throat starts closing up, tongue swells, severe headaches and backpain   Metronidazole Diarrhea and Nausea And Vomiting   Other     Green peas - itching and nauea   Prednisone  Other (See Comments)    Angry     Statins Other (See Comments)    Leg pain   Tuna Oil [Fish Oil] Diarrhea and Nausea And Vomiting   Budesonide  Nausea And Vomiting and Rash    Nebulizing solution    Cephalexin Diarrhea and Nausea And Vomiting   Methotrexate Derivatives Swelling and Rash   Morphine  And Codeine Itching and Anxiety    exreme mood swings   Tomato Itching and Rash    ROS Review of Systems  Constitutional:   Positive for fatigue. Negative for chills and fever.  HENT:  Negative for congestion, sinus pressure, sinus pain and sore throat.   Eyes:  Negative for pain and discharge.  Respiratory:  Positive for shortness of breath. Negative for cough.   Cardiovascular:  Negative for chest pain and palpitations.  Gastrointestinal:  Positive for nausea. Negative for abdominal pain and vomiting.  Endocrine: Negative for polydipsia and polyuria.  Genitourinary:  Negative for dysuria and hematuria.  Musculoskeletal:  Positive for arthralgias, back pain, gait problem and neck pain. Negative for neck stiffness.  Skin:  Negative for rash.  Neurological:  Negative for dizziness and weakness.  Psychiatric/Behavioral:  Positive for sleep disturbance. Negative for agitation and behavioral problems. The patient is nervous/anxious.       Objective:    Physical Exam Vitals reviewed.  Constitutional:      General: She is not in acute distress.    Appearance: She is not diaphoretic.  HENT:     Head: Normocephalic.     Nose: No congestion.     Comments: Mild swelling over right maxillary area    Mouth/Throat:     Mouth: Mucous membranes are moist.  Eyes:     General: No scleral icterus.    Extraocular Movements: Extraocular movements intact.  Neck:     Comments: Right-sided neck mass - thyroid  nodule, nontender, about 3 cm in diameter Cardiovascular:     Rate and Rhythm: Normal rate and regular rhythm.     Heart sounds: Normal heart sounds. No murmur heard. Pulmonary:     Breath sounds: Normal breath sounds. No wheezing or rales.     Comments: On 3 LPM O2 Abdominal:     Palpations: Abdomen is soft.     Tenderness: There is abdominal tenderness (Epigastric).  Musculoskeletal:     Cervical back: Neck supple. No tenderness.     Right knee: Swelling (Mild) present. Normal range of motion. Tenderness present.     Right lower leg: No edema.     Left lower leg: No edema.  Skin:    General: Skin is warm.      Findings: No rash.  Neurological:     General: No focal deficit present.     Mental Status: She is alert and oriented to person, place, and time.  Sensory: Sensory deficit present.     Motor: Weakness (4/5 in b/l UE and LE) present.  Psychiatric:        Mood and Affect: Mood normal.        Behavior: Behavior normal.     BP 119/64   Pulse (!) 111   Ht 5' 8 (1.727 m)   Wt 163 lb 9.6 oz (74.2 kg)   SpO2 97% Comment: set to 3  BMI 24.88 kg/m  Wt Readings from Last 3 Encounters:  04/23/24 154 lb (69.9 kg)  04/18/24 158 lb 9.6 oz (71.9 kg)  04/04/24 159 lb 6.4 oz (72.3 kg)    Lab Results  Component Value Date   TSH 6.820 (H) 01/23/2024   Lab Results  Component Value Date   WBC 6.6 01/23/2024   HGB 13.8 01/23/2024   HCT 43.3 01/23/2024   MCV 92 01/23/2024   PLT 438 01/23/2024   Lab Results  Component Value Date   NA 142 04/19/2024   K 4.0 04/19/2024   CO2 27 04/19/2024   GLUCOSE 98 04/19/2024   BUN 20 04/19/2024   CREATININE 1.02 (H) 04/19/2024   BILITOT 0.4 04/19/2024   ALKPHOS 89 04/19/2024   AST 65 (H) 04/19/2024   ALT 88 (H) 04/19/2024   PROT 7.6 04/19/2024   ALBUMIN 5.1 (H) 04/19/2024   CALCIUM  9.7 04/19/2024   ANIONGAP 8 12/12/2023   EGFR 59 (L) 04/19/2024   GFR 58.40 (L) 01/28/2021   Lab Results  Component Value Date   CHOL 202 (H) 01/23/2024   Lab Results  Component Value Date   HDL 35 (L) 01/23/2024   Lab Results  Component Value Date   LDLCALC 82 01/23/2024   Lab Results  Component Value Date   TRIG 529 (H) 01/23/2024   Lab Results  Component Value Date   CHOLHDL 5.8 (H) 01/23/2024   Lab Results  Component Value Date   HGBA1C 5.4 01/23/2024      Assessment & Plan:   Problem List Items Addressed This Visit       Cardiovascular and Mediastinum   Essential hypertension - Primary   BP Readings from Last 1 Encounters:  02/06/24 119/64   Well-controlled with Imdur  (for CAD/HFpEF) Counseled for compliance with the  medications Advised DASH diet and moderate exercise/walking as tolerated        Respiratory   Chronic obstructive pulmonary disease (HCC)   Well controlled with Stiolto and as needed albuterol  Followed by Pulmonology Has 3 lpm home O2 for chronic hypoxia      Chronic respiratory failure with hypoxia (HCC)   Due to COPD Followed by Pulmonology Has 3 lpm home O2 for chronic hypoxia        Endocrine   Type 2 diabetes mellitus with neurological complications (HCC)   Lab Results  Component Value Date   HGBA1C 5.4 01/23/2024   Well controlled usually, had hyperglycemia around EGD due to not being able to take Mounjaro  On Mounjaro  5 mg qw Has had episodes of hypoglycemia (<50) in the past, has CGM now, no recent episode of hypoglycemia  On Jardiance  for HFpEF (did not tolerate Farxiga  10 mg) Advised to follow diabetic diet Has statin intolerance F/u CMP and lipid panel Diabetic eye exam: Advised to follow up with Ophthalmology for diabetic eye exam  Takes gabapentin  800 mg TID, she has noticed improvement in numbness and tingling of bilateral LE      Relevant Orders   CMP14+EGFR   Hemoglobin A1c  Other   Hyperlipidemia   Advised to follow low carb diet 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       Relevant Orders   Lipid Profile   Elevated TSH   Lab Results  Component Value Date   TSH 6.820 (H) 01/23/2024   Recheck TSH with free T4      Relevant Orders   TSH + free T4   Acute pain of right knee   Complains of right knee pain, worse since 01/09/24 (around the day of fall) Check x-ray of right knee Takes Norco as needed for severe pain       Relevant Orders   DG Knee Complete 4 Views Right   Vitamin D  deficiency   Last vitamin D  Lab Results  Component Value Date   VD25OH 17.4 (L) 01/23/2024   Started Vitamin D  50,000 IU QW      Relevant Medications   Vitamin D , Ergocalciferol , (DRISDOL ) 1.25 MG (50000 UNIT) CAPS capsule   RESOLVED: Facial trauma,  initial encounter   Likely mechanical fall leading to facial injury Has right-sided facial swelling, no injury over the eye Check x-ray of facial bones Advised to apply cool compresses for swelling      Relevant Orders   DG Facial Bones 1-2 Views     Meds ordered this encounter  Medications   Vitamin D , Ergocalciferol , (DRISDOL ) 1.25 MG (50000 UNIT) CAPS capsule    Sig: Take 1 capsule (50,000 Units total) by mouth every 7 (seven) days.    Dispense:  12 capsule    Refill:  1    Follow-up: Return in about 4 months (around 06/08/2024) for DM and HLD.    Suzzane MARLA Blanch, MD

## 2024-02-06 NOTE — Assessment & Plan Note (Signed)
Advised to follow low carb diet 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

## 2024-02-06 NOTE — Assessment & Plan Note (Signed)
 Complains of right knee pain, worse since 01/09/24 (around the day of fall) Check x-ray of right knee Takes Norco as needed for severe pain

## 2024-02-06 NOTE — Assessment & Plan Note (Signed)
Well controlled with Stiolto and as needed albuterol Followed by Pulmonology Has 3 lpm home O2 for chronic hypoxia

## 2024-02-12 ENCOUNTER — Other Ambulatory Visit: Payer: Self-pay | Admitting: Cardiology

## 2024-02-13 ENCOUNTER — Encounter

## 2024-02-13 ENCOUNTER — Telehealth: Payer: Self-pay | Admitting: Cardiology

## 2024-02-13 NOTE — Telephone Encounter (Signed)
 A new order needs to be put in for the patient so that she can have an ultrasound done at [facility]. Please advise

## 2024-02-16 ENCOUNTER — Telehealth: Payer: Self-pay | Admitting: *Deleted

## 2024-02-18 ENCOUNTER — Other Ambulatory Visit: Payer: Self-pay | Admitting: Emergency Medicine

## 2024-02-20 ENCOUNTER — Ambulatory Visit (HOSPITAL_COMMUNITY)

## 2024-02-20 DIAGNOSIS — J449 Chronic obstructive pulmonary disease, unspecified: Secondary | ICD-10-CM | POA: Diagnosis not present

## 2024-02-20 DIAGNOSIS — J9611 Chronic respiratory failure with hypoxia: Secondary | ICD-10-CM | POA: Diagnosis not present

## 2024-02-23 ENCOUNTER — Other Ambulatory Visit: Payer: Self-pay | Admitting: Internal Medicine

## 2024-02-23 DIAGNOSIS — F411 Generalized anxiety disorder: Secondary | ICD-10-CM

## 2024-02-26 ENCOUNTER — Telehealth: Payer: Self-pay

## 2024-02-26 DIAGNOSIS — M797 Fibromyalgia: Secondary | ICD-10-CM | POA: Diagnosis not present

## 2024-02-26 DIAGNOSIS — Z79891 Long term (current) use of opiate analgesic: Secondary | ICD-10-CM | POA: Diagnosis not present

## 2024-02-26 DIAGNOSIS — G894 Chronic pain syndrome: Secondary | ICD-10-CM | POA: Diagnosis not present

## 2024-02-26 DIAGNOSIS — M47816 Spondylosis without myelopathy or radiculopathy, lumbar region: Secondary | ICD-10-CM | POA: Diagnosis not present

## 2024-02-26 NOTE — Telephone Encounter (Signed)
 Copied from CRM [id number]. Topic: Clinical - Prescription Issue >> Feb 26, 2024  2:28 PM Willma R wrote: Reason for CRM: Patient states spoke to the pharmacy today and was told the doctor closed out the prescription for LORazepam  (ATIVAN ) 0.5 MG tablet so they are not able to fill it. Patient would like to confirm it has been filled and will be ready on 07/23  Patient can be reached at [phone]

## 2024-02-27 ENCOUNTER — Ambulatory Visit (HOSPITAL_COMMUNITY)
Admission: RE | Admit: 2024-02-27 | Discharge: 2024-02-27 | Disposition: A | Discharge status: Home / Self Care | Source: Ambulatory Visit | Attending: Gastroenterology | Admitting: Gastroenterology

## 2024-02-27 DIAGNOSIS — K760 Fatty (change of) liver, not elsewhere classified: Secondary | ICD-10-CM | POA: Diagnosis not present

## 2024-02-27 DIAGNOSIS — R161 Splenomegaly, not elsewhere classified: Secondary | ICD-10-CM | POA: Diagnosis not present

## 2024-02-27 DIAGNOSIS — R7989 Other specified abnormal findings of blood chemistry: Secondary | ICD-10-CM | POA: Insufficient documentation

## 2024-02-27 DIAGNOSIS — Z9049 Acquired absence of other specified parts of digestive tract: Secondary | ICD-10-CM | POA: Diagnosis not present

## 2024-02-27 NOTE — Telephone Encounter (Signed)
 Spoke to [facility], states it is to soon she last filled it 01/29/24, it can be filled on 02/28/24, tomorrow. Called pt to let her know unable to reach her left a detailed vm.

## 2024-02-29 ENCOUNTER — Other Ambulatory Visit: Payer: Self-pay | Admitting: Internal Medicine

## 2024-02-29 DIAGNOSIS — R42 Dizziness and giddiness: Secondary | ICD-10-CM

## 2024-03-01 ENCOUNTER — Telehealth: Payer: Self-pay

## 2024-03-01 ENCOUNTER — Other Ambulatory Visit: Payer: Self-pay

## 2024-03-01 DIAGNOSIS — I25118 Atherosclerotic heart disease of native coronary artery with other forms of angina pectoris: Secondary | ICD-10-CM

## 2024-03-01 MED ORDER — ISOSORBIDE DINITRATE 30 MG PO TABS: 30.0000 mg | ORAL_TABLET | Freq: Every day | ORAL | 1 refills | Status: AC

## 2024-03-01 NOTE — Telephone Encounter (Signed)
 Copied from CRM [id number]. Topic: Clinical - Prescription Issue >> Feb 29, 2024  4:21 PM Kayla Ryan wrote: Reason for CRM: isosorbide  dinitrate (ISORDIL ) 30 MG tablet- per patient pharmacy has sent two requests for refill and no response- [phone]

## 2024-03-01 NOTE — Telephone Encounter (Signed)
 Refill sent.

## 2024-03-04 ENCOUNTER — Ambulatory Visit: Attending: Cardiology | Admitting: Cardiology

## 2024-03-04 ENCOUNTER — Encounter: Payer: Self-pay | Admitting: Cardiology

## 2024-03-04 VITALS — BP 130/58 | HR 108 | Ht 68.0 in | Wt 163.0 lb

## 2024-03-04 DIAGNOSIS — E782 Mixed hyperlipidemia: Secondary | ICD-10-CM

## 2024-03-04 DIAGNOSIS — I6523 Occlusion and stenosis of bilateral carotid arteries: Secondary | ICD-10-CM | POA: Diagnosis not present

## 2024-03-04 DIAGNOSIS — I5032 Chronic diastolic (congestive) heart failure: Secondary | ICD-10-CM

## 2024-03-04 MED ORDER — EZETIMIBE 10 MG PO TABS: 10.0000 mg | ORAL_TABLET | Freq: Every day | ORAL | 3 refills | Status: AC

## 2024-03-04 NOTE — Progress Notes (Signed)
    Cardiology Office Note  Date: 03/04/2024   ID: Kayla Ryan, DOB March 28, 1956, MRN [id number]  History of Present Illness: Kayla Ryan is a 68 y.o. female last seen in July 2024.  She is here for a routine visit.  She does not report any obvious fluid retention, no orthopnea or PND.  No sense of palpitations.  We went over her medications.  She remains on Mounjaro  and Jardiance  with follow-up by PCP.  No diuretic requirement.  States that her Zetia  10 mg tablets have run out, refill being provided.  Her lipids did improve in terms of LDL control, most recently down to 82 while on Zetia .  Triglycerides are elevated, she reports history of prior intolerance of omega-3 supplements.  We discussed carbohydrate restriction.  She is due for follow-up carotid Dopplers.  Physical Exam: VS:  BP (!) 130/58 (BP Location: Left Arm, Cuff Size: Normal)   Pulse (!) 108   Ht 5' 8 (1.727 m)   Wt 163 lb (73.9 kg)   SpO2 97% Comment: on 3 liters oxygen   BMI 24.78 kg/m , BMI Body mass index is 24.78 kg/m.  Wt Readings from Last 3 Encounters:  03/04/24 163 lb (73.9 kg)  02/06/24 163 lb 9.6 oz (74.2 kg)  12/13/23 163 lb 9.6 oz (74.2 kg)    General: Patient appears comfortable at rest. HEENT: Conjunctiva and lids normal. Neck: Supple, no elevated JVP or carotid bruits. Lungs: Clear to auscultation, nonlabored breathing at rest. Cardiac: Regular rate and rhythm, no S3 or significant systolic murmur, no pericardial rub. Extremities: No pitting edema..  ECG:  An ECG dated 12/12/2023 was personally reviewed today and demonstrated:  Sinus rhythm with prolonged PR interval.  Labwork: 01/23/2024: ALT 79; AST 54; BUN 26; Creatinine, Ser 0.78; Hemoglobin 13.8; Platelets 438; Potassium 4.6; Sodium 138; TSH 6.820     Component Value Date/Time   CHOL 202 (H) 01/23/2024 1338   TRIG 529 (H) 01/23/2024 1338   HDL 35 (L) 01/23/2024 1338   CHOLHDL 5.8 (H) 01/23/2024 1338   LDLCALC 82 01/23/2024 1338    Other Studies Reviewed Today:  No interval cardiac testing for review today.  Assessment and Plan:  1.  History of HFpEF per chart review.  Follow-up echocardiogram in August 2024 revealed LVEF 65 to 70% with mild diastolic dysfunction, normal RV contraction.  No fluid retention or progressive dyspnea exertion over baseline.  Current regimen includes Jardiance  10 mg daily and Mounjaro  5 mg weekly.  No diuretic requirement at this point.   2.  History of atrial fibrillation documented during hospitalization in December 2021.  CHA2DS2-VASc score is 5.  She has not been anticoagulated with history of severe GI bleeding.  No obvious rhythm recurrence.  Recent ECG showed sinus rhythm.   3.  Asymptomatic carotid artery disease.  Carotid Dopplers in August 2024 revealed 50 to 69% bilateral ICA stenosis.  Follow-up carotid Dopplers.   4.  Mixed hyperlipidemia with history of statin myalgias.  LDL 82 in June.  Plan to continue Zetia  10 mg daily for now.  Has reported prior intolerance to omega-3 supplements.  Discussed carbohydrate restriction.  Disposition:  Follow up 1 year.  Signed, Jayson JUDITHANN Sierras, M.D., F.A.C.C. [facility]

## 2024-03-04 NOTE — Patient Instructions (Signed)
 Medication Instructions:   Take Zetia  10 mg daily  Labwork: None today  Testing/Procedures: None today  Follow-Up: 1 year  Any Other Special Instructions Will Be Listed Below (If Applicable).  If you need a refill on your cardiac medications before your next appointment, please call your pharmacy.

## 2024-03-05 ENCOUNTER — Telehealth: Payer: Self-pay | Admitting: *Deleted

## 2024-03-06 ENCOUNTER — Ambulatory Visit: Admitting: Gastroenterology

## 2024-03-11 ENCOUNTER — Other Ambulatory Visit: Payer: Self-pay

## 2024-03-11 ENCOUNTER — Other Ambulatory Visit: Payer: Self-pay | Admitting: *Deleted

## 2024-03-11 NOTE — Patient Outreach (Signed)
 Complex Care Management   Visit Note  03/11/2024  Name:  Kayla Ryan MRN: [id number] DOB: March 31, 1956  Situation: Referral received for Complex Care Management related to COPD I obtained verbal consent from Patient.  Visit completed with patient  on the phone  Background:   Past Medical History:  Diagnosis Date   Allergy Unknown   Anemia    Anxiety 1883   Arthritis    Asthma    CHF (congestive heart failure) (HCC) 2021   Chronic bronchitis    Crohn disease (HCC)    Emphysema    Emphysema of lung (HCC) 2003   Fibromyalgia    GERD (gastroesophageal reflux disease)    GI bleed    Herpes    History of blood transfusion    Hyperlipidemia    Hypertension 2015   IBS (irritable bowel syndrome)    Kidney failure    Migraines    Muscular dystrophy (HCC)    Neck pain    Oxygen  deficiency 2021   PAF (paroxysmal atrial fibrillation) (HCC)    Not anticoagulated with history of severe GI bleeding   Plantar fasciitis    Polymyositis (HCC)    PONV (postoperative nausea and vomiting)    Right thyroid  nodule 04/07/2022   Scoliosis    Type 2 diabetes mellitus (HCC)     Assessment: Patient Reported Symptoms:  Cognitive Cognitive Status: No symptoms reported Cognitive/Intellectual Conditions Management [RPT]: None reported or documented in medical history or problem list   Health Maintenance Behaviors: Annual physical exam Healing Pattern: Average Health Facilitated by: Rest  Neurological Neurological Review of Symptoms: Numbness, Dizziness Neurological Management Strategies: Routine screening, Medication therapy Neurological Self-Management Outcome: 3 (uncertain) Neurological Comment: Numbess in both feet  HEENT HEENT Symptoms Reported: Nasal discharge HEENT Management Strategies: Routine screening HEENT Self-Management Outcome: 4 (good)    Cardiovascular Cardiovascular Symptoms Reported: Fatigue Does patient have uncontrolled Hypertension?: No Cardiovascular Management  Strategies: Routine screening Cardiovascular Self-Management Outcome: 4 (good)  Respiratory Respiratory Symptoms Reported: Productive cough, Wheezing Other Respiratory Symptoms: Reports green sputum Respiratory Management Strategies: Oxygen  therapy, Medication therapy, Routine screening Respiratory Self-Management Outcome: 3 (uncertain)  Endocrine Endocrine Symptoms Reported: No symptoms reported Is patient diabetic?: Yes Is patient checking blood sugars at home?: Yes List most recent blood sugar readings, include date and time of day: 03-11-24 0702 103 Endocrine Self-Management Outcome: 4 (good)  Gastrointestinal Gastrointestinal Symptoms Reported: Diarrhea Gastrointestinal Management Strategies: Medication therapy Gastrointestinal Self-Management Outcome: 3 (uncertain)    Genitourinary Genitourinary Symptoms Reported: No symptoms reported Genitourinary Self-Management Outcome: 4 (good)  Integumentary Integumentary Symptoms Reported: No symptoms reported Skin Management Strategies: Routine screening Skin Self-Management Outcome: 4 (good) Skin Comment: reports spider bite to abdomen - no s/s infection  Musculoskeletal Musculoskelatal Symptoms Reviewed: No symptoms reported Musculoskeletal Management Strategies: Routine screening Musculoskeletal Self-Management Outcome: 4 (good) Number of falls in past year: 2 or more Was there an injury with Fall?: Yes Patient at Risk for Falls Due to: History of fall(s) Fall risk Follow up: Falls evaluation completed, Education provided  Psychosocial Psychosocial Symptoms Reported: No symptoms reported Behavioral Management Strategies: Support system Behavioral Health Self-Management Outcome: 4 (good) Major Change/Loss/Stressor/Fears (CP): Denies Techniques to Cope with Loss/Stress/Change: Not applicable Quality of Family Relationships: supportive Do you feel physically threatened by others?: No      03/11/2024    1:40 PM  Depression screen PHQ  2/9  Decreased Interest 0  Down, Depressed, Hopeless 0  PHQ - 2 Score 0    There were no vitals  filed for this visit.  Medications Reviewed Today     Reviewed by Bertrum Rosina HERO, RN (Registered Nurse) on 03/11/24 at 1316  Med List Status: <None>   Medication Order Taking? Sig Documenting Provider Last Dose Status Informant  ACCU-CHEK GUIDE test strip [id number] Yes USE TO CHECK BLOOD GLUCOSE THREE TIMES DAILY, MORNING, AT NOON, AND AT BEDTIME Tobie Suzzane POUR, MD  Active Self  Accu-Chek Softclix Lancets lancets [id number] Yes USE TO TEST BLOOD SUGAR EVERY MORNING, AT NOON AND EVERY NIGHT AT BEDTIME Tobie Suzzane POUR, MD  Active Self  acyclovir  (ZOVIRAX ) 400 MG tablet [id number] Yes TAKE 1 TABLET(400 MG) BY MOUTH THREE TIMES DAILY  Patient taking differently: 400 mg 2 (two) times daily.   Tobie Suzzane POUR, MD  Active   albuterol  (PROVENTIL ) (2.5 MG/3ML) 0.083% nebulizer solution [id number] Yes Take 3 mLs (2.5 mg total) by nebulization every 6 (six) hours as needed for wheezing or shortness of breath. Orlie Madelin RAMAN, NP  Active Self  ASPERCREME LIDOCAINE  EX [id number] Yes Apply 1 Application topically 3 (three) times daily as needed (pain). [provider]  Active Self  cetirizine  (ZYRTEC ) 10 MG tablet [id number] Yes Take 10 mg by mouth 2 (two) times daily. [provider]  Active Self  Continuous Glucose Receiver (DEXCOM G7 RECEIVER) ESPIRIDION [id number] Yes USE TO CHECK BLOOD GLUCOSE AS NEEDED Tobie Suzzane POUR, MD  Active Self  Continuous Glucose Sensor (DEXCOM G7 SENSOR) MISC [id number] Yes USE TO CHECK BLOOD SUGAR THREE TIMES DAILY, BEFORE MEALS AND AT BEDTIME AND AS NEEDED. CHANGE SENSOR EVERY 10 DAYS Tobie Suzzane POUR, MD  Active   dicyclomine  (BENTYL ) 10 MG capsule [id number] Yes Take 1 capsule (10 mg total) by mouth 4 (four) times daily -  before meals and at bedtime. Monitor for constipation, dry mouth, dizziness Shirlean Therisa ORN, NP  Active   diphenhydrAMINE  (BENADRYL ) 25 MG tablet  [id number] Yes Take 50 mg by mouth daily as needed for allergies or itching. [provider]  Active Self  doxepin  (SINEQUAN ) 10 MG capsule [id number] Yes TAKE 1 CAPSULE(10 MG) BY MOUTH AT BEDTIME Tobie Suzzane POUR, MD  Active   DULoxetine  (CYMBALTA ) 60 MG capsule [id number] Yes TAKE 1 CAPSULE(60 MG) BY MOUTH TWICE DAILY Patel, Rutwik K, MD  Active   ezetimibe  (ZETIA ) 10 MG tablet [id number] Yes Take 1 tablet (10 mg total) by mouth daily. Debera Jayson MATSU, MD  Active   gabapentin  (NEURONTIN ) 800 MG tablet [id number] Yes TAKE 1 TABLET(800 MG) BY MOUTH THREE TIMES DAILY  Patient taking differently: Take 800 mg by mouth 2 (two) times daily.   Tobie Suzzane POUR, MD  Active            Med Note GARNET, Elijahjames Fuelling M   Mon Mar 11, 2024  1:13 PM)    Glucagon  (GVOKE HYPOPEN  2-PACK) 1 MG/0.2ML EMMANUEL [id number] Yes Inject 0.2 mLs into the skin as needed (Blood glucose < 53). Tobie Suzzane POUR, MD  Active Self  Glycerin-Hypromellose-PEG 400 (DRY EYE RELIEF DROPS OP) [id number] Yes Place 1 drop into both eyes 3 (three) times daily as needed (Dry eye). [provider]  Active Self  HYDROcodone -acetaminophen  (NORCO) 10-325 MG tablet [id number] Yes Take 1 tablet by mouth every 4 (four) hours as needed. Stop Hydrocodone  7.5/325mg  tablet.   Active Self  ipratropium (ATROVENT  HFA) 17 MCG/ACT inhaler [id number] Yes Inhale 2 puffs into the lungs every 4 (four) hours as needed (COPD). Byrum, Robert S, MD  Active Self  isosorbide  dinitrate (ISORDIL ) 30  MG tablet [id number] Yes Take 1 tablet (30 mg total) by mouth daily. Tobie Suzzane POUR, MD  Active   JARDIANCE  10 MG TABS tablet [id number] Yes TAKE 1 TABLET(10 MG) BY MOUTH DAILY BEFORE BREAKFAST Patel, Rutwik K, MD  Active   Lancets Misc. (ACCU-CHEK SOFTCLIX LANCET DEV) KIT [id number] Yes USE TO CHECK GLUCOSE THREE TIMES DAILY Tobie Suzzane POUR, MD  Active Self  LORazepam  (ATIVAN ) 0.5 MG tablet [id number] Yes TAKE 1 TABLET(0.5 MG) BY MOUTH THREE TIMES DAILY AS NEEDED FOR ANXIETY  Patel, Rutwik K, MD  Active   Magnesium  Hydroxide (MILK OF MAGNESIA PO) [id number] Yes Take by mouth. Takes as needed [provider]  Active   meclizine  (ANTIVERT ) 25 MG tablet [id number] Yes TAKE 1 TABLET(25 MG) BY MOUTH THREE TIMES DAILY AS NEEDED FOR DIZZINESS Patel, Rutwik K, MD  Active   methocarbamol  (ROBAXIN ) 500 MG tablet [id number] Yes TAKE 1 TABLET(500 MG) BY MOUTH EVERY 8 HOURS AS NEEDED FOR MUSCLE SPASMS Tobie Suzzane POUR, MD  Active   Misc. Devices KIT [id number] Yes Henmnii rollator walker Tobie Suzzane POUR, MD  Active Self  montelukast  (SINGULAIR ) 10 MG tablet [id number] Yes TAKE 1 TABLET(10 MG) BY MOUTH AT BEDTIME Shelah Lamar RAMAN, MD  Active   MOUNJARO  5 MG/0.5ML Pen [id number] Yes ADMINISTER 5 MG UNDER THE SKIN 1 TIME A WEEK Tobie, Suzzane POUR, MD  Active   nitroGLYCERIN  (NITROSTAT ) 0.4 MG SL tablet [id number] Yes Place 1 tablet (0.4 mg total) under the tongue every 5 (five) minutes as needed for chest pain. Debera Jayson MATSU, MD  Active Self  omeprazole  (PRILOSEC) 40 MG capsule [id number] Yes Take 1 capsule (40 mg total) by mouth 2 (two) times daily. Tobie Suzzane POUR, MD  Active   ondansetron  (ZOFRAN -ODT) 8 MG disintegrating tablet [id number] Yes DISSOLVE 1 TABLET EVERY 8 HOURS AS NEEDED FOR FOR NAUSEA AND VOMITING Rogers Hai, MD  Active   STIOLTO RESPIMAT  2.5-2.5 MCG/ACT AERS [id number] Yes INHALE 2 PUFFS INTO THE LUNGS DAILY Tobie Suzzane POUR, MD  Active Self  Vitamin D , Ergocalciferol , (DRISDOL ) 1.25 MG (50000 UNIT) CAPS capsule [id number] Yes Take 1 capsule (50,000 Units total) by mouth every 7 (seven) days. Tobie Suzzane POUR, MD  Active   XIFAXAN  550 MG TABS tablet [id number]  TAKE 1 TABLET(550 MG) BY MOUTH THREE TIMES DAILY FOR 14 DAYS  Patient not taking: Reported on 03/11/2024   Shirlean Therisa ORN, NP  Active             Recommendation:   Continue Current Plan of Care  Follow Up Plan:   Telephone follow-up in 2 weeks  Rosina Forte, BSN RN [facility], [facility] RN Care Manager Direct Dial: [phone]  Fax: [phone]

## 2024-03-11 NOTE — Patient Instructions (Signed)
 Visit Information  Thank you for taking time to visit with me today. Please don't hesitate to contact me if I can be of assistance to you before our next scheduled appointment.  Our next appointment is by telephone on 03-25-2024 at 10:30 am Please call the care guide team at [phone] if you need to cancel or reschedule your appointment.   Following is a copy of your care plan:   Goals Addressed             This Visit's Progress    COMPLETED: Patient will be able to have decrease green respiratory secretions + no worsening COPD,diabetes issues/fall prevention  - RN CM services       Patient will be able to attend all her medical appointments-   Patient will have decreased pulmonary worsening symptoms to include diarrhea  confirmed her antibiotics has cause improvements but her secretions are now reported still green She had reported it clearer on 07/10/23 on 09/04/23  Interventions Today    Flowsheet Row Most Recent Value  Chronic Disease   Chronic disease during today's visit Diabetes, Chronic Obstructive Pulmonary Disease (COPD), Hypertension (HTN), Other  [recurrent green respiratory secretions after antibiotics completed diarrhea]  General Interventions   General Interventions Discussed/Reviewed General Interventions Reviewed, Sick Day Rules, Doctor Visits  Doctor Visits Discussed/Reviewed Doctor Visits Reviewed, PCP, Specialist  [discussed infectious disease providers]  PCP/Specialist Visits Compliance with follow-up visit  Education Interventions   Education Provided Provided Education  [discussed infectious disease providers BRAT diet]  Provided Verbal Education On Sick Day Rules, Nutrition, Medication, When to see the doctor  Mental Health Interventions   Mental Health Discussed/Reviewed Mental Health Reviewed, Coping Strategies  Nutrition Interventions   Nutrition Discussed/Reviewed Nutrition Reviewed, Adding fruits and vegetables, Fluid intake, Supplemental nutrition   [BRAT diet]  Pharmacy Interventions   Pharmacy Dicussed/Reviewed Pharmacy Topics Reviewed, Affording Medications  Safety Interventions   Safety Discussed/Reviewed Safety Reviewed, Fall Risk, Home Safety  Home Safety Assistive Devices           VBCI RN Care Plan: COPD       Problems:  Chronic Disease Management support and education needs related to COPD  Goal: Over the next 90 days the Patient will attend all scheduled medical appointments: with primary care provider and specialist as evidenced by keeping all scheduled appointments        continue to work with RN Care Manager and/or Social Worker to address care management and care coordination needs related to COPD as evidenced by adherence to care management team scheduled appointments     take all medications exactly as prescribed and will call provider for medication related questions as evidenced by compliance with all medications    verbalize basic understanding of COPD disease process and self health management plan as evidenced by verbal explanation, recognizing/monitoring symptoms, lifestyle modifications  Interventions:   COPD Interventions: Advised patient to engage in light exercise as tolerated 3-5 days a week to aid in the the management of COPD Advised patient to track and manage COPD triggers Advised patient to self assesses COPD action plan zone and make appointment with provider if in the yellow zone for 48 hours without improvement. RNCM reviewed action plan with patient. She is reporting green sputum and aware when to notify provider Assessed social determinant of health barriers Discussed the importance of adequate rest and management of fatigue with COPD Provided education about and advised patient to utilize infection prevention strategies to reduce risk of respiratory infection Provided instruction  about proper use of medications used for management of COPD including inhalers Provided patient with basic written  and verbal COPD education on self care/management/and exacerbation prevention Provided written and verbal instructions on pursed lip breathing and utilized returned demonstration as teach back Screening for signs and symptoms of depression related to chronic disease state  Use of home oxygen   Patient Self-Care Activities:  Attend all scheduled provider appointments Call pharmacy for medication refills 3-7 days in advance of running out of medications Call provider office for new concerns or questions  Take medications as prescribed   identify and remove indoor air pollutants do breathing exercises every day develop a rescue plan eliminate symptom triggers at home follow rescue plan if symptoms flare-up practice relaxation or meditation daily do breathing exercises every day do exercises in a comfortable position that makes breathing as easy as possible  Plan:  Telephone follow up appointment with care management team member scheduled for:  03-25-2024 at 10:30 am             Please call the Suicide and Crisis Lifeline: 988 call the [place]  National Suicide Prevention Lifeline: (920)163-8734 or TTY: (207) 120-9432 TTY 873-518-7153) to talk to a trained counselor call 1-800-273-TALK (toll free, 24 hour hotline) call the [facility]: 515-654-1091 call 911 if you are experiencing a Mental Health or Behavioral Health Crisis or need someone to talk to.  Patient verbalizes understanding of instructions and care plan provided today and agrees to view in MyChart. Active MyChart status and patient understanding of how to access instructions and care plan via MyChart confirmed with patient.     Kayla Ryan, BSN RN [facility], [facility] RN Care Manager Direct Dial: [phone]  Fax: [phone]

## 2024-03-18 ENCOUNTER — Ambulatory Visit (HOSPITAL_COMMUNITY)

## 2024-03-19 ENCOUNTER — Ambulatory Visit: Payer: Self-pay | Admitting: Gastroenterology

## 2024-03-21 ENCOUNTER — Ambulatory Visit (HOSPITAL_COMMUNITY): Admission: RE | Admit: 2024-03-21 | Source: Ambulatory Visit

## 2024-03-21 ENCOUNTER — Inpatient Hospital Stay (HOSPITAL_COMMUNITY): Admission: RE | Admit: 2024-03-21 | Source: Ambulatory Visit

## 2024-03-21 ENCOUNTER — Encounter (HOSPITAL_COMMUNITY): Payer: Self-pay

## 2024-03-22 DIAGNOSIS — M1811 Unilateral primary osteoarthritis of first carpometacarpal joint, right hand: Secondary | ICD-10-CM | POA: Diagnosis not present

## 2024-03-22 DIAGNOSIS — M19041 Primary osteoarthritis, right hand: Secondary | ICD-10-CM | POA: Diagnosis not present

## 2024-03-22 DIAGNOSIS — M65311 Trigger thumb, right thumb: Secondary | ICD-10-CM | POA: Diagnosis not present

## 2024-03-22 DIAGNOSIS — M19042 Primary osteoarthritis, left hand: Secondary | ICD-10-CM | POA: Diagnosis not present

## 2024-03-22 DIAGNOSIS — M180 Bilateral primary osteoarthritis of first carpometacarpal joints: Secondary | ICD-10-CM | POA: Diagnosis not present

## 2024-03-22 DIAGNOSIS — J449 Chronic obstructive pulmonary disease, unspecified: Secondary | ICD-10-CM | POA: Diagnosis not present

## 2024-03-22 DIAGNOSIS — J9611 Chronic respiratory failure with hypoxia: Secondary | ICD-10-CM | POA: Diagnosis not present

## 2024-03-25 ENCOUNTER — Other Ambulatory Visit: Payer: Self-pay | Admitting: Internal Medicine

## 2024-03-25 ENCOUNTER — Other Ambulatory Visit: Payer: Self-pay | Admitting: *Deleted

## 2024-03-25 DIAGNOSIS — E041 Nontoxic single thyroid nodule: Secondary | ICD-10-CM

## 2024-03-25 DIAGNOSIS — M5030 Other cervical disc degeneration, unspecified cervical region: Secondary | ICD-10-CM

## 2024-03-25 NOTE — Patient Outreach (Addendum)
 Complex Care Management   Visit Note  03/25/2024  Name:  Kayla Ryan MRN: [id number] DOB: Jun 04, 1956  Situation: Referral received for Complex Care Management related to COPD I obtained verbal consent from Patient.  Visit completed with patient  on the phone  Background:   Past Medical History:  Diagnosis Date   Allergy Unknown   Anemia    Anxiety 1883   Arthritis    Asthma    CHF (congestive heart failure) (HCC) 2021   Chronic bronchitis    Crohn disease (HCC)    Emphysema    Emphysema of lung (HCC) 2003   Fibromyalgia    GERD (gastroesophageal reflux disease)    GI bleed    Herpes    History of blood transfusion    Hyperlipidemia    Hypertension 2015   IBS (irritable bowel syndrome)    Kidney failure    Migraines    Muscular dystrophy (HCC)    Neck pain    Oxygen  deficiency 2021   PAF (paroxysmal atrial fibrillation) (HCC)    Not anticoagulated with history of severe GI bleeding   Plantar fasciitis    Polymyositis (HCC)    PONV (postoperative nausea and vomiting)    Right thyroid  nodule 04/07/2022   Scoliosis    Type 2 diabetes mellitus (HCC)     Assessment: Patient Reported Symptoms:  Cognitive Cognitive Status: No symptoms reported Cognitive/Intellectual Conditions Management [RPT]: None reported or documented in medical history or problem list   Health Maintenance Behaviors: Annual physical exam Healing Pattern: Average Health Facilitated by: Rest  Neurological Neurological Review of Symptoms: No symptoms reported Neurological Self-Management Outcome: 4 (good)  HEENT HEENT Symptoms Reported: Nasal discharge HEENT Self-Management Outcome: 4 (good)    Cardiovascular Cardiovascular Symptoms Reported: No symptoms reported Cardiovascular Management Strategies: Routine screening Cardiovascular Self-Management Outcome: 4 (good)  Respiratory Respiratory Symptoms Reported: Productive cough, Wheezing Other Respiratory Symptoms: green sputum Respiratory  Management Strategies: Oxygen  therapy Respiratory Self-Management Outcome: 4 (good)  Endocrine Endocrine Symptoms Reported: No symptoms reported Is patient diabetic?: Yes Is patient checking blood sugars at home?: Yes List most recent blood sugar readings, include date and time of day: 03-25-2024 0700 106 Endocrine Self-Management Outcome: 4 (good)  Gastrointestinal Gastrointestinal Symptoms Reported: Bleeding, Diarrhea Additional Gastrointestinal Details: reported coffee colored diarrhea after drinking Kombucha. Patient stated she will notify GI today to see how she needs to proceed. RNCM advised notifying PCP, if she was unable to reach GI. Gastrointestinal Self-Management Outcome: 3 (uncertain)    Genitourinary Genitourinary Symptoms Reported: No symptoms reported    Integumentary Integumentary Symptoms Reported: Bruising Additional Integumentary Details: Bruise on knee Skin Management Strategies: Routine screening Skin Self-Management Outcome: 4 (good) Skin Comment: spider bite healing and now scabbed over  Musculoskeletal Musculoskelatal Symptoms Reviewed: Back pain Musculoskeletal Management Strategies: Coping strategies Musculoskeletal Self-Management Outcome: 4 (good) Falls in the past year?: Yes Number of falls in past year: 2 or more Was there an injury with Fall?: Yes Fall Risk Category Calculator: 3 Patient Fall Risk Level: High Fall Risk Patient at Risk for Falls Due to: History of fall(s) Fall risk Follow up: Falls evaluation completed, Education provided  Psychosocial Psychosocial Symptoms Reported: No symptoms reported Behavioral Management Strategies: Support system Behavioral Health Self-Management Outcome: 4 (good) Major Change/Loss/Stressor/Fears (CP): Denies Techniques to Cope with Loss/Stress/Change: Not applicable Quality of Family Relationships: supportive Do you feel physically threatened by others?: No      03/25/2024   10:51 AM  Depression screen PHQ  2/9  Decreased  Interest 0  Down, Depressed, Hopeless 0  PHQ - 2 Score 0    There were no vitals filed for this visit.  Medications Reviewed Today     Reviewed by Bertrum Rosina HERO, RN (Registered Nurse) on 03/25/24 at 1043  Med List Status: <None>   Medication Order Taking? Sig Documenting Provider Last Dose Status Informant  ACCU-CHEK GUIDE test strip [id number] Yes USE TO CHECK BLOOD GLUCOSE THREE TIMES DAILY, MORNING, AT NOON, AND AT BEDTIME Tobie Suzzane POUR, MD  Active Self  Accu-Chek Softclix Lancets lancets [id number] Yes USE TO TEST BLOOD SUGAR EVERY MORNING, AT NOON AND EVERY NIGHT AT BEDTIME Tobie Suzzane POUR, MD  Active Self  acyclovir  (ZOVIRAX ) 400 MG tablet [id number] Yes TAKE 1 TABLET(400 MG) BY MOUTH THREE TIMES DAILY  Patient taking differently: 400 mg 2 (two) times daily.   Tobie Suzzane POUR, MD  Active   albuterol  (PROVENTIL ) (2.5 MG/3ML) 0.083% nebulizer solution [id number] Yes Take 3 mLs (2.5 mg total) by nebulization every 6 (six) hours as needed for wheezing or shortness of breath. Orlie Madelin RAMAN, NP  Active Self  ASPERCREME LIDOCAINE  EX [id number] Yes Apply 1 Application topically 3 (three) times daily as needed (pain). [provider]  Active Self  cetirizine  (ZYRTEC ) 10 MG tablet [id number] Yes Take 10 mg by mouth 2 (two) times daily. [provider]  Active Self  Continuous Glucose Receiver (DEXCOM G7 RECEIVER) ESPIRIDION [id number] Yes USE TO CHECK BLOOD GLUCOSE AS NEEDED Tobie Suzzane POUR, MD  Active Self  Continuous Glucose Sensor (DEXCOM G7 SENSOR) MISC [id number] Yes USE TO CHECK BLOOD SUGAR THREE TIMES DAILY, BEFORE MEALS AND AT BEDTIME AND AS NEEDED. CHANGE SENSOR EVERY 10 DAYS Tobie Suzzane POUR, MD  Active   dicyclomine  (BENTYL ) 10 MG capsule [id number] Yes Take 1 capsule (10 mg total) by mouth 4 (four) times daily -  before meals and at bedtime. Monitor for constipation, dry mouth, dizziness Shirlean Therisa ORN, NP  Active   diphenhydrAMINE  (BENADRYL ) 25 MG tablet  [id number] Yes Take 50 mg by mouth daily as needed for allergies or itching. [provider]  Active Self  doxepin  (SINEQUAN ) 10 MG capsule [id number] Yes TAKE 1 CAPSULE(10 MG) BY MOUTH AT BEDTIME Tobie Suzzane POUR, MD  Active   DULoxetine  (CYMBALTA ) 60 MG capsule [id number] Yes TAKE 1 CAPSULE(60 MG) BY MOUTH TWICE DAILY Patel, Rutwik K, MD  Active   ezetimibe  (ZETIA ) 10 MG tablet [id number] Yes Take 1 tablet (10 mg total) by mouth daily. Debera Jayson MATSU, MD  Active   gabapentin  (NEURONTIN ) 800 MG tablet [id number] Yes TAKE 1 TABLET(800 MG) BY MOUTH THREE TIMES DAILY  Patient taking differently: Take 800 mg by mouth 2 (two) times daily.   Tobie Suzzane POUR, MD  Active            Med Note GARNET, ROSINA HERO   Mon Mar 11, 2024  1:13 PM)    Glucagon  (GVOKE HYPOPEN  2-PACK) 1 MG/0.2ML EMMANUEL [id number] Yes Inject 0.2 mLs into the skin as needed (Blood glucose < 53). Tobie Suzzane POUR, MD  Active Self  Glycerin-Hypromellose-PEG 400 (DRY EYE RELIEF DROPS OP) [id number] Yes Place 1 drop into both eyes 3 (three) times daily as needed (Dry eye). [provider]  Active Self  HYDROcodone -acetaminophen  (NORCO) 10-325 MG tablet [id number] Yes Take 1 tablet by mouth every 4 (four) hours as needed. Stop Hydrocodone  7.5/325mg  tablet.   Active Self  ipratropium (ATROVENT  HFA) 17 MCG/ACT inhaler [id number] Yes Inhale 2 puffs into the  lungs every 4 (four) hours as needed (COPD). Byrum, Robert S, MD  Active Self  isosorbide  dinitrate (ISORDIL ) 30 MG tablet [id number] Yes Take 1 tablet (30 mg total) by mouth daily. Tobie Suzzane POUR, MD  Active   JARDIANCE  10 MG TABS tablet [id number] Yes TAKE 1 TABLET(10 MG) BY MOUTH DAILY BEFORE BREAKFAST Patel, Rutwik K, MD  Active   Lancets Misc. (ACCU-CHEK SOFTCLIX LANCET DEV) KIT [id number] Yes USE TO CHECK GLUCOSE THREE TIMES DAILY Tobie Suzzane POUR, MD  Active Self  LORazepam  (ATIVAN ) 0.5 MG tablet [id number] Yes TAKE 1 TABLET(0.5 MG) BY MOUTH THREE TIMES DAILY AS NEEDED FOR ANXIETY  Patel, Rutwik K, MD  Active   Magnesium  Hydroxide (MILK OF MAGNESIA PO) [id number] Yes Take by mouth. Takes as needed [provider]  Active   meclizine  (ANTIVERT ) 25 MG tablet [id number] Yes TAKE 1 TABLET(25 MG) BY MOUTH THREE TIMES DAILY AS NEEDED FOR DIZZINESS Patel, Rutwik K, MD  Active   methocarbamol  (ROBAXIN ) 500 MG tablet [id number] Yes TAKE 1 TABLET(500 MG) BY MOUTH EVERY 8 HOURS AS NEEDED FOR MUSCLE SPASMS Tobie Suzzane POUR, MD  Active   Misc. Devices KIT [id number] Yes Henmnii rollator walker Tobie Suzzane POUR, MD  Active Self  montelukast  (SINGULAIR ) 10 MG tablet [id number] Yes TAKE 1 TABLET(10 MG) BY MOUTH AT BEDTIME Shelah Lamar RAMAN, MD  Active   MOUNJARO  5 MG/0.5ML Pen [id number] Yes ADMINISTER 5 MG UNDER THE SKIN 1 TIME A WEEK Tobie, Suzzane POUR, MD  Active   nitroGLYCERIN  (NITROSTAT ) 0.4 MG SL tablet [id number] Yes Place 1 tablet (0.4 mg total) under the tongue every 5 (five) minutes as needed for chest pain. Debera Jayson MATSU, MD  Active Self  omeprazole  (PRILOSEC) 40 MG capsule [id number] Yes Take 1 capsule (40 mg total) by mouth 2 (two) times daily. Tobie Suzzane POUR, MD  Active   ondansetron  (ZOFRAN -ODT) 8 MG disintegrating tablet [id number] Yes DISSOLVE 1 TABLET EVERY 8 HOURS AS NEEDED FOR FOR NAUSEA AND VOMITING Rogers Hai, MD  Active   STIOLTO RESPIMAT  2.5-2.5 MCG/ACT AERS [id number] Yes INHALE 2 PUFFS INTO THE LUNGS DAILY Tobie Suzzane POUR, MD  Active Self  Vitamin D , Ergocalciferol , (DRISDOL ) 1.25 MG (50000 UNIT) CAPS capsule [id number] Yes Take 1 capsule (50,000 Units total) by mouth every 7 (seven) days. Tobie Suzzane POUR, MD  Active   XIFAXAN  550 MG TABS tablet [id number]  TAKE 1 TABLET(550 MG) BY MOUTH THREE TIMES DAILY FOR 14 DAYS  Patient not taking: Reported on 03/25/2024   Shirlean Therisa ORN, NP  Active             Recommendation:   Continue Current Plan of Care Thyroid  Ultrasound due to elevated TSH. Message sent to provider office for follow up.  Follow Up Plan:    Telephone follow-up in 1 month  Rosina Forte, BSN RN [facility], [facility] RN Care Manager Direct Dial: [phone]  Fax: [phone]

## 2024-03-25 NOTE — Telephone Encounter (Unsigned)
 Copied from CRM #[id number]. Topic: Clinical - Medication Refill >> Mar 25, 2024  3:10 PM Thersia C wrote: Medication: methocarbamol  (ROBAXIN ) 500 MG tablet gabapentin  (NEURONTIN ) 800 MG tablet  Has the patient contacted their pharmacy? Yes (Agent: If no, request that the patient contact the pharmacy for the refill. If patient does not wish to contact the pharmacy document the reason why and proceed with request.) (Agent: If yes, when and what did the pharmacy advise?)  This is the patient's preferred pharmacy:  [id number] - [place], [place] - [address] [place] [address] Phone: [phone] Fax: [phone]  Is this the correct pharmacy for this prescription? Yes If no, delete pharmacy and type the correct one.   Has the prescription been filled recently? No  Is the patient out of the medication? Yes  Has the patient been seen for an appointment in the last year OR does the patient have an upcoming appointment? Yes  Can we respond through MyChart? Yes  Agent: Please be advised that Rx refills may take up to 3 business days. We ask that you follow-up with your pharmacy.

## 2024-03-25 NOTE — Patient Instructions (Signed)
 Visit Information  Thank you for taking time to visit with me today. Please don't hesitate to contact me if I can be of assistance to you before our next scheduled appointment.  Your next care management appointment is by telephone on 04-22-2024 at 10:30 am  Telephone follow-up in 1 month  Please call the care guide team at [phone] if you need to cancel, schedule, or reschedule an appointment.   Please call the Suicide and Crisis Lifeline: 988 call the [place]  National Suicide Prevention Lifeline: 904 404 6296 or TTY: 702-187-0905 TTY (332)137-7170) to talk to a trained counselor call 1-800-273-TALK (toll free, 24 hour hotline) call the [facility]: [phone] call 911 if you are experiencing a Mental Health or Behavioral Health Crisis or need someone to talk to.  Rosina Forte, BSN RN [facility], [facility] RN Care Manager Direct Dial: [phone]  Fax: [phone]

## 2024-03-26 MED ORDER — GABAPENTIN 800 MG PO TABS: 800.0000 mg | ORAL_TABLET | Freq: Three times a day (TID) | ORAL | 5 refills | Status: AC

## 2024-03-28 ENCOUNTER — Ambulatory Visit (HOSPITAL_COMMUNITY)
Admission: RE | Admit: 2024-03-28 | Discharge: 2024-03-28 | Disposition: A | Discharge status: Home / Self Care | Source: Ambulatory Visit | Attending: Internal Medicine

## 2024-03-28 ENCOUNTER — Ambulatory Visit (HOSPITAL_COMMUNITY)
Admission: RE | Admit: 2024-03-28 | Discharge: 2024-03-28 | Disposition: A | Discharge status: Home / Self Care | Source: Ambulatory Visit | Attending: Cardiology | Admitting: Cardiology

## 2024-03-28 ENCOUNTER — Ambulatory Visit (HOSPITAL_COMMUNITY)
Admission: RE | Admit: 2024-03-28 | Discharge: 2024-03-28 | Disposition: A | Discharge status: Home / Self Care | Source: Ambulatory Visit | Attending: Internal Medicine | Admitting: Internal Medicine

## 2024-03-28 DIAGNOSIS — E041 Nontoxic single thyroid nodule: Secondary | ICD-10-CM | POA: Diagnosis not present

## 2024-03-28 DIAGNOSIS — Z13820 Encounter for screening for osteoporosis: Secondary | ICD-10-CM | POA: Diagnosis not present

## 2024-03-28 DIAGNOSIS — Z780 Asymptomatic menopausal state: Secondary | ICD-10-CM | POA: Diagnosis not present

## 2024-03-28 DIAGNOSIS — I779 Disorder of arteries and arterioles, unspecified: Secondary | ICD-10-CM | POA: Insufficient documentation

## 2024-03-28 DIAGNOSIS — I6523 Occlusion and stenosis of bilateral carotid arteries: Secondary | ICD-10-CM | POA: Diagnosis not present

## 2024-03-29 ENCOUNTER — Ambulatory Visit: Payer: Self-pay | Admitting: Internal Medicine

## 2024-04-01 ENCOUNTER — Ambulatory Visit: Payer: Self-pay | Admitting: Cardiology

## 2024-04-01 ENCOUNTER — Ambulatory Visit (HOSPITAL_COMMUNITY)

## 2024-04-03 ENCOUNTER — Ambulatory Visit: Admitting: Emergency Medicine

## 2024-04-04 ENCOUNTER — Ambulatory Visit: Payer: Self-pay | Admitting: Internal Medicine

## 2024-04-04 ENCOUNTER — Ambulatory Visit (INDEPENDENT_AMBULATORY_CARE_PROVIDER_SITE_OTHER): Admitting: Emergency Medicine

## 2024-04-04 ENCOUNTER — Encounter: Payer: Self-pay | Admitting: Emergency Medicine

## 2024-04-04 VITALS — BP 115/64 | HR 93 | Ht 68.0 in | Wt 159.4 lb

## 2024-04-04 DIAGNOSIS — R918 Other nonspecific abnormal finding of lung field: Secondary | ICD-10-CM | POA: Diagnosis not present

## 2024-04-04 DIAGNOSIS — K219 Gastro-esophageal reflux disease without esophagitis: Secondary | ICD-10-CM

## 2024-04-04 DIAGNOSIS — J449 Chronic obstructive pulmonary disease, unspecified: Secondary | ICD-10-CM

## 2024-04-04 DIAGNOSIS — J9611 Chronic respiratory failure with hypoxia: Secondary | ICD-10-CM

## 2024-04-04 DIAGNOSIS — Z87891 Personal history of nicotine dependence: Secondary | ICD-10-CM | POA: Diagnosis not present

## 2024-04-04 DIAGNOSIS — J309 Allergic rhinitis, unspecified: Secondary | ICD-10-CM

## 2024-04-04 MED ORDER — AZITHROMYCIN 250 MG PO TABS: 250.0000 mg | ORAL_TABLET | Freq: Every day | ORAL | 11 refills | Status: DC

## 2024-04-04 NOTE — Patient Instructions (Signed)
 Please continue Stiolto 2 puffs once daily. Keep your Atrovent  available use 2 puffs up to every 6 hours as needed for shortness of breath, chest tightness, wheeze Okay to use your albuterol  nebulizer if needed for shortness of breath Continue your oxygen  reliably at 3 L/min.  We will send an order to [employer] to get you a lighter portable oxygen  concentrator.  Will try to get the lightest one possible Please continue your Zyrtec , Singulair  and omeprazole  as you have been taking them. We will repeat your CT scan of the chest in December 2025. Follow Dr. Shelah in December, call sooner if you have any problems.

## 2024-04-04 NOTE — Progress Notes (Signed)
 Subjective:    Patient ID: Kayla Ryan, female    DOB: Dec 17, 1955, 68 y.o.   MRN: [id number]  HPI  ROV 04/04/2024 --Kayla Ryan is 37 with a history of former tobacco use and COPD, severe obstruction and a positive bronchodilator response with asthmatic features noted on her pulmonary function testing.  She has chronic cough with associated chronic rhinitis and GERD.  CT chest has shown micronodular disease consistent with a bronchiolitis, question RB-ILD.  Past medical history also significant for Crohn's disease with IBS, anemia, fibromyalgia and inflammatory myopathy.  She has chronic hypoxemic respiratory failure on 3 L/min.   She has been managed on Stiolto, uses atrovent  as her rescue - very rarely uses. Has albuterol  nebs available as well. She is on zyrtec  and singulair . Remains on omeprazole  40mg  bid.  She had an acute flare in fall 2024 Reports now that she has cough - often associated with swallowing, near-choking, often greenish. She has persistent dysphagia, follows w [facility] (was dilated earlier this year).  She believes that her POC is too heavy - needs a letter or order to [facility] to get a lighter one. She is able to exert.   CT chest12/10/2022 reviewed by me shows no mediastinal or hilar adenopathy, moderate emphysema with areas of tree-in-bud nodularity in the right middle lobe, right lower lobe.  Some linear subsegmental bandlike atelectasis in the anterior right middle lobe and lingula.  No suspicious nodules.  Review of Systems As per HPI  Past Medical History:  Diagnosis Date   Allergy Unknown   Anemia    Anxiety 1883   Arthritis    Asthma 1980   CHF (congestive heart failure) (HCC) 2021   Chronic bronchitis    Crohn disease (HCC)    Emphysema    Emphysema of lung (HCC) 2003   Fibromyalgia    GERD (gastroesophageal reflux disease)    GI bleed    Herpes    History of blood transfusion    Hyperlipidemia    Hypertension 2015   IBS (irritable bowel syndrome)     Kidney failure    Migraines    Muscular dystrophy (HCC)    Neck pain    Oxygen  deficiency 2021   PAF (paroxysmal atrial fibrillation) (HCC)    Not anticoagulated with history of severe GI bleeding   Plantar fasciitis    Polymyositis (HCC)    PONV (postoperative nausea and vomiting)    Right thyroid  nodule 04/07/2022   Scoliosis    Type 2 diabetes mellitus (HCC)      Family History  Problem Relation Age of Onset   Lung cancer Mother    Cancer Mother    Miscarriages / [place] Mother    Varicose Veins Mother    Hypertension Mother    Anxiety disorder Mother    Depression Mother    COPD Father    Lung cancer Father    Lymphoma Father    Alcohol abuse Father    Arthritis Father    Cancer Father    Anxiety disorder Sister    Anxiety disorder Sister    Intellectual disability Sister    Depression Brother    Inflammatory bowel disease Neg Hx    Colon cancer Neg Hx      Social History   Socioeconomic History   Marital status: Widowed    Spouse name: Not on file   Number of children: Not on file   Years of education: Not on file   Highest education level:  Some college, no degree  Occupational History   Not on file  Tobacco Use   Smoking status: Former    Current packs/day: 0.00    Average packs/day: 1 pack/day for 49.0 years (49.0 ttl pk-yrs)    Types: Cigarettes    Start date: 08/08/1966    Quit date: 08/31/2005    Years since quitting: 18.6   Smokeless tobacco: Never  Vaping Use   Vaping status: Never Used  Substance and Sexual Activity   Alcohol use: No   Drug use: No    Comment: used marijuana in teens   Sexual activity: Not Currently    Birth control/protection: Surgical  Other Topics Concern   Not on file  Social History Narrative   Are you right handed or left handed? Left Handed    Are you currently employed ? Retired    What is your current occupation?   Do you live at home alone? Yes   Who lives with you?    What type of home do you live  in: 1 story or 2 story? Lives in a one story home        Social Drivers of Health   Financial Resource Strain: Medium Risk (02/02/2024)   Overall Financial Resource Strain (CARDIA)    Difficulty of Paying Living Expenses: Somewhat hard  Food Insecurity: Food Insecurity Present (03/11/2024)   Hunger Vital Sign    Worried About Running Out of Food in the Last Year: Sometimes true    Ran Out of Food in the Last Year: Sometimes true  Transportation Needs: No Transportation Needs (03/11/2024)   PRAPARE - Administrator, Civil Service (Medical): No    Lack of Transportation (Non-Medical): No  Physical Activity: Insufficiently Active (08/10/2023)   Exercise Vital Sign    Days of Exercise per Week: 7 days    Minutes of Exercise per Session: 20 min  Stress: Stress Concern Present (08/10/2023)   [employer] of Occupational Health - Occupational Stress Questionnaire    Feeling of Stress : To some extent  Social Connections: Unknown (02/02/2024)   Social Connection and Isolation Panel    Frequency of Communication with Friends and Family: More than three times a week    Frequency of Social Gatherings with Friends and Family: Patient declined    Attends Religious Services: Patient declined    Database administrator or Organizations: Patient declined    Attends Banker Meetings: Not on file    Marital Status: Widowed  Intimate Partner Violence: Not At Risk (03/11/2024)   Humiliation, Afraid, Rape, and Kick questionnaire    Fear of Current or Ex-Partner: No    Emotionally Abused: No    Physically Abused: No    Sexually Abused: No    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  From [place], lived in [place], [place], [place].   Allergies  Allergen Reactions   Sulfa Antibiotics Shortness Of Breath, Swelling and Rash   Chicken Allergy Diarrhea and Nausea And Vomiting   Clindamycin /Lincomycin Itching and Nausea And Vomiting   Dilaudid  [Hydromorphone  Hcl]     Makes me crazy    Ferrlecit [Na Ferric Gluc Cplx In Sucrose] Itching    Itching and throat tightness   Iron      Throat starts closing up, tongue swells, severe headaches and backpain   Metronidazole Diarrhea and Nausea And Vomiting   Other     Green peas - itching and nauea   Prednisone  Other (See Comments)  Angry     Statins Other (See Comments)    Leg pain   Tuna Oil [Fish Oil] Diarrhea and Nausea And Vomiting   Budesonide  Nausea And Vomiting and Rash    Nebulizing solution    Cephalexin Diarrhea and Nausea And Vomiting   Methotrexate Derivatives Swelling and Rash   Morphine  And Codeine Itching and Anxiety    exreme mood swings   Tomato Itching and Rash     Outpatient Medications Prior to Visit  Medication Sig Dispense Refill   ACCU-CHEK GUIDE test strip USE TO CHECK BLOOD GLUCOSE THREE TIMES DAILY, MORNING, AT NOON, AND AT BEDTIME 100 strip 0   Accu-Chek Softclix Lancets lancets USE TO TEST BLOOD SUGAR EVERY MORNING, AT NOON AND EVERY NIGHT AT BEDTIME 100 each 0   acyclovir  (ZOVIRAX ) 400 MG tablet TAKE 1 TABLET(400 MG) BY MOUTH THREE TIMES DAILY (Patient taking differently: 400 mg 2 (two) times daily.) 90 tablet 3   albuterol  (PROVENTIL ) (2.5 MG/3ML) 0.083% nebulizer solution Take 3 mLs (2.5 mg total) by nebulization every 6 (six) hours as needed for wheezing or shortness of breath. 75 mL 5   ASPERCREME LIDOCAINE  EX Apply 1 Application topically 3 (three) times daily as needed (pain).     cetirizine  (ZYRTEC ) 10 MG tablet Take 10 mg by mouth 2 (two) times daily.     Continuous Glucose Receiver (DEXCOM G7 RECEIVER) DEVI USE TO CHECK BLOOD GLUCOSE AS NEEDED 1 each 0   Continuous Glucose Sensor (DEXCOM G7 SENSOR) MISC USE TO CHECK BLOOD SUGAR THREE TIMES DAILY, BEFORE MEALS AND AT BEDTIME AND AS NEEDED. CHANGE SENSOR EVERY 10 DAYS 3 each 5   diphenhydrAMINE  (BENADRYL ) 25 MG tablet Take 50 mg by mouth daily as needed for allergies or itching.     doxepin  (SINEQUAN ) 10 MG capsule TAKE 1 CAPSULE(10  MG) BY MOUTH AT BEDTIME 30 capsule 3   DULoxetine  (CYMBALTA ) 60 MG capsule TAKE 1 CAPSULE(60 MG) BY MOUTH TWICE DAILY 60 capsule 3   ezetimibe  (ZETIA ) 10 MG tablet Take 1 tablet (10 mg total) by mouth daily. 90 tablet 3   gabapentin  (NEURONTIN ) 800 MG tablet Take 1 tablet (800 mg total) by mouth 3 (three) times daily. TAKE 1 TABLET(800 MG) BY MOUTH THREE TIMES DAILY 90 tablet 5   Glucagon  (GVOKE HYPOPEN  2-PACK) 1 MG/0.2ML SOAJ Inject 0.2 mLs into the skin as needed (Blood glucose < 53). 0.4 mL 5   Glycerin-Hypromellose-PEG 400 (DRY EYE RELIEF DROPS OP) Place 1 drop into both eyes 3 (three) times daily as needed (Dry eye).     HYDROcodone -acetaminophen  (NORCO) 10-325 MG tablet Take 1 tablet by mouth every 4 (four) hours as needed. Stop Hydrocodone  7.5/325mg  tablet. 180 tablet 0   ipratropium (ATROVENT  HFA) 17 MCG/ACT inhaler Inhale 2 puffs into the lungs every 4 (four) hours as needed (COPD). 1 each 4   isosorbide  dinitrate (ISORDIL ) 30 MG tablet Take 1 tablet (30 mg total) by mouth daily. 90 tablet 1   JARDIANCE  10 MG TABS tablet TAKE 1 TABLET(10 MG) BY MOUTH DAILY BEFORE BREAKFAST 30 tablet 5   Lancets Misc. (ACCU-CHEK SOFTCLIX LANCET DEV) KIT USE TO CHECK GLUCOSE THREE TIMES DAILY 1 kit 0   LORazepam  (ATIVAN ) 0.5 MG tablet TAKE 1 TABLET(0.5 MG) BY MOUTH THREE TIMES DAILY AS NEEDED FOR ANXIETY 90 tablet 3   Magnesium  Hydroxide (MILK OF MAGNESIA PO) Take by mouth. Takes as needed     meclizine  (ANTIVERT ) 25 MG tablet TAKE 1 TABLET(25 MG) BY MOUTH THREE TIMES  DAILY AS NEEDED FOR DIZZINESS 30 tablet 1   methocarbamol  (ROBAXIN ) 500 MG tablet TAKE 1 TABLET(500 MG) BY MOUTH EVERY 8 HOURS AS NEEDED FOR MUSCLE SPASMS 30 tablet 2   Misc. Devices KIT Henmnii rollator walker 1 kit 0   montelukast  (SINGULAIR ) 10 MG tablet TAKE 1 TABLET(10 MG) BY MOUTH AT BEDTIME 90 tablet 3   MOUNJARO  5 MG/0.5ML Pen ADMINISTER 5 MG UNDER THE SKIN 1 TIME A WEEK 6 mL 1   nitroGLYCERIN  (NITROSTAT ) 0.4 MG SL tablet Place 1 tablet  (0.4 mg total) under the tongue every 5 (five) minutes as needed for chest pain. 90 tablet 3   omeprazole  (PRILOSEC) 40 MG capsule Take 1 capsule (40 mg total) by mouth 2 (two) times daily. 180 capsule 3   ondansetron  (ZOFRAN -ODT) 8 MG disintegrating tablet DISSOLVE 1 TABLET EVERY 8 HOURS AS NEEDED FOR FOR NAUSEA AND VOMITING 257 tablet 1   STIOLTO RESPIMAT  2.5-2.5 MCG/ACT AERS INHALE 2 PUFFS INTO THE LUNGS DAILY 4 g 11   Vitamin D , Ergocalciferol , (DRISDOL ) 1.25 MG (50000 UNIT) CAPS capsule Take 1 capsule (50,000 Units total) by mouth every 7 (seven) days. 12 capsule 1   XIFAXAN  550 MG TABS tablet TAKE 1 TABLET(550 MG) BY MOUTH THREE TIMES DAILY FOR 14 DAYS 42 tablet 0   dicyclomine  (BENTYL ) 10 MG capsule Take 1 capsule (10 mg total) by mouth 4 (four) times daily -  before meals and at bedtime. Monitor for constipation, dry mouth, dizziness (Patient not taking: Reported on 04/04/2024) 120 capsule 3   No facility-administered medications prior to visit.        Objective:   Physical Exam Vitals:   04/04/24 1123  BP: 115/64  Pulse: 93  SpO2: 98%  Weight: 159 lb 6.4 oz (72.3 kg)  Height: 5' 8 (1.727 m)   Gen: Pleasant, well-nourished, in no distress,  normal affect  ENT: No lesions,  mouth clear,  oropharynx clear, no postnasal drip  Neck: No JVD, no stridor  Lungs: No use of accessory muscles, no crackles or wheezing on normal respiration, no wheeze  Cardiovascular: RRR, heart sounds normal, no murmur or gallops, no peripheral edema  Musculoskeletal: No deformities, no cyanosis or clubbing  Neuro: alert, awake, non focal  Skin: Warm, no lesions or rash     Assessment & Plan:  Chronic obstructive pulmonary disease (HCC) Please continue Stiolto 2 puffs once daily. Keep your Atrovent  available use 2 puffs up to every 6 hours as needed for shortness of breath, chest tightness, wheeze Okay to use your albuterol  nebulizer if needed for shortness of breath Follow Dr. Shelah in  December, call sooner if you have any problems.  Chronic respiratory failure with hypoxia (HCC) Continue your oxygen  reliably at 3 L/min.  We will send an order to [facility] to get you a lighter portable oxygen  concentrator.  Will try to get the lightest one possible  Allergic rhinitis Please continue your Zyrtec , Singulair  as you have been taking them.  Pulmonary nodules We will repeat your CT scan of the chest in December 2025.  Gastroesophageal reflux disease without esophagitis Continue PPI    Lamar Shelah, MD, PhD 04/04/2024, 5:09 PM  Pulmonary and Critical Care [phone] or if no answer [phone]

## 2024-04-04 NOTE — Assessment & Plan Note (Signed)
 Please continue Stiolto 2 puffs once daily. Keep your Atrovent  available use 2 puffs up to every 6 hours as needed for shortness of breath, chest tightness, wheeze Okay to use your albuterol  nebulizer if needed for shortness of breath Follow Dr. Shelah in December, call sooner if you have any problems.

## 2024-04-04 NOTE — Assessment & Plan Note (Signed)
 Continue your oxygen  reliably at 3 L/min.  We will send an order to [facility] to get you a lighter portable oxygen  concentrator.  Will try to get the lightest one possible

## 2024-04-04 NOTE — Assessment & Plan Note (Signed)
 Please continue your Zyrtec , Singulair  as you have been taking them.

## 2024-04-04 NOTE — Assessment & Plan Note (Signed)
 Continue PPI.

## 2024-04-04 NOTE — Assessment & Plan Note (Signed)
 We will repeat your CT scan of the chest in December 2025.

## 2024-04-05 ENCOUNTER — Telehealth: Payer: Self-pay | Admitting: Emergency Medicine

## 2024-04-05 DIAGNOSIS — R918 Other nonspecific abnormal finding of lung field: Secondary | ICD-10-CM

## 2024-04-05 NOTE — Telephone Encounter (Signed)
 Per Avelina at [facility]  So in order for us  to do that it looks like we would need the RX updated - Frequency, changed to continuous with stationary and portable oxygen  unit needed and the sats co- signed please.

## 2024-04-05 NOTE — Telephone Encounter (Signed)
 She already has an Inogen through her DME [facility], but needs a lighter device if at all possible. 3L/min.

## 2024-04-05 NOTE — Telephone Encounter (Signed)
 I have re sent the new order to DME company

## 2024-04-05 NOTE — Progress Notes (Signed)
 SATURATION QUALIFICATIONS: (This note is used to comply with regulatory documentation for home oxygen )   Patient Saturations on Room Air at Rest = 95%   Patient Saturations on Room Air while Ambulating = 86%   Patient Saturations on 2 Liters of oxygen  via POC while Ambulating = 95%   Please briefly explain why patient needs home oxygen : Pt O2 drops with exertion Pt was walked using POC in office  O2 2L

## 2024-04-05 NOTE — Telephone Encounter (Signed)
 I have placed new order. I do not see any sats in the pts chart under flowsheets, but this was in the care coordination note.   SATURATION QUALIFICATIONS: (This note is used to comply with regulatory documentation for home oxygen )   Patient Saturations on Room Air at Rest = 95%   Patient Saturations on Room Air while Ambulating = 86%   Patient Saturations on 2 Liters of oxygen  via POC while Ambulating = 95%   Please briefly explain why patient needs home oxygen : Pt O2 drops with exertion Pt was walked using POC in office  O2 2L   Kayla Ryan can this be used? I have addended RB note with these sats.

## 2024-04-06 DIAGNOSIS — J449 Chronic obstructive pulmonary disease, unspecified: Secondary | ICD-10-CM | POA: Diagnosis not present

## 2024-04-11 ENCOUNTER — Telehealth: Payer: Self-pay

## 2024-04-11 NOTE — Telephone Encounter (Signed)
 Spoke to pt states she has not been having issues with the medications unsure who would have called to inform our office of this. Advised her to keep f/u appt.

## 2024-04-11 NOTE — Telephone Encounter (Signed)
 Copied from CRM [id number]. Topic: Clinical - Medication Question >> Apr 10, 2024  2:49 PM Uyopbjy D wrote: dicyclomine  (BENTYL ) 10 MG capsule,  doxepin  (SINEQUAN ) 10 MG capsule, meclizine  (ANTIVERT ) 25 MG tablet Wants to recommend something safer these have bad side effects. HYDROcodone -acetaminophen  (NORCO) 10-325 MG tablet, LORazepam  (ATIVAN ) 0.5 MG tablet- Wants to know if pt can come off of one of these [place] callback [phone]

## 2024-04-15 ENCOUNTER — Telehealth: Payer: Self-pay

## 2024-04-15 NOTE — Telephone Encounter (Signed)
 Copied from CRM [id number]. Topic: Clinical - Order For Equipment >> Apr 15, 2024 12:49 PM Kayla Ryan wrote: Reason for CRM: Kayla Ryan with [employer] called to see if fax was received [facility] for pts oxygen  (portable machine). Stated it should be 3lbs per [place]. Stated Kayla Ryan faxed over order today as urgent.    Spoke w/ pt order placed on 8/28 was correct, waiting on paperwork to be faxed to MD to sign

## 2024-04-18 ENCOUNTER — Ambulatory Visit: Admitting: Gastroenterology

## 2024-04-18 VITALS — BP 132/74 | HR 99 | Temp 98.6°F | Ht 68.0 in | Wt 158.6 lb

## 2024-04-18 DIAGNOSIS — K760 Fatty (change of) liver, not elsewhere classified: Secondary | ICD-10-CM

## 2024-04-18 DIAGNOSIS — R1312 Dysphagia, oropharyngeal phase: Secondary | ICD-10-CM | POA: Diagnosis not present

## 2024-04-18 NOTE — Progress Notes (Signed)
 Gastroenterology Office Note     Primary Care Physician:  Tobie Suzzane POUR, MD  Primary Gastroenterologist: Dr. Cindie   Chief Complaint   Chief Complaint  Patient presents with   Follow-up    Follow up US      History of Present Illness   Kayla Ryan is a 68 y.o. female presenting today with a history of COPD, dermatomyositis, GERD, questionable Crohn's disease in the past but 2022 EGD/colonoscopy without Crohns, capsule in 2023 unrevealing, IDA requiring transfusions in the past, chronic diarrhea, last seen in April 2025 for GERD and nausea. At that time was also noting dysphagia, chronic diarrhea, and mildly fluctuating elevated transaminases. She underwent EGD with dilation in interim from last visit as noted below. Returns today in follow-up for dysphagia, GERD, fluctuating transaminases.   She is talkative and concerned about her right eye today. Right eye got soap in it this morning: evaluated . Doesn't wear contacts. Clinique astringent soap. No visible injury. No vision loss.   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 Negative H.pylori. regurgitating after eating. Swallows food with coughing. Getting pills hung in throat. Omeprazole  BID.   Chronic diarrhea: celiac serologies negative,last colonoscopy 2022. Due to multiple questions regarding LFTs and dysphagia, we did not address hx of diarrhea today.   Elevated transaminases: US  abdomen with hepatic steatosis and splenomegaly. Hep C negative in 2023. No iron  overload. Recommending thorough serologies at this point. She is eager to do this.    Previous pertinent work up:   Colonoscopy September 2022: -Diverticulosis in the sigmoid colon -Redundant and elongated colon   Remote hx of polyps (Dr Jama over 5 years ago).    EGD September 2022: - Normal esophagus. Status post esophageal dilation -  Normal stomach. - Normal duodenal bulb, second portion of the duodenum and third portion of the duodenum.   Capsule Feb 2023: few stomach erosions, 3 AVMs, no signs of IBD.      Diagnosed with Crohn's at [facility]. Records not available.  Niece with Crohn's    Father: mesenteric ischemia, passed     Past Medical History:  Diagnosis Date   Allergy Unknown   Anemia    Anxiety 1883   Arthritis    Asthma 1980   CHF (congestive heart failure) (HCC) 2021   Chronic bronchitis    Crohn disease (HCC)    Emphysema    Emphysema of lung (HCC) 2003   Fibromyalgia    GERD (gastroesophageal reflux disease)    GI bleed    Herpes    History of blood transfusion    Hyperlipidemia    Hypertension 2015   IBS (irritable bowel syndrome)    Kidney failure    Migraines    Muscular dystrophy (HCC)    Neck pain    Oxygen  deficiency 2021   PAF (paroxysmal atrial fibrillation) (HCC)    Not anticoagulated with history of severe GI bleeding   Plantar fasciitis    Polymyositis (HCC)    PONV (postoperative nausea and vomiting)    Right thyroid  nodule 04/07/2022   Scoliosis    Type 2 diabetes mellitus (HCC)     Past Surgical History:  Procedure Laterality Date   ABDOMINAL HYSTERECTOMY     AGILE CAPSULE N/A 06/24/2021   Procedure: AGILE CAPSULE;  Surgeon: Cindie Carlin POUR, DO;  [facility];  Service: Endoscopy;  Laterality: N/A;  AGILE CAPSULE N/A 07/22/2021   Procedure: AGILE CAPSULE;  Surgeon: Shaaron Lamar HERO, MD;  [facility];  Service: Endoscopy;  Laterality: N/A;  7:30am   bone spur     BREAST SURGERY  1995   CHOLECYSTECTOMY     COLON SURGERY  1995   COLONOSCOPY WITH PROPOFOL  N/A 04/30/2021   Procedure: COLONOSCOPY WITH PROPOFOL ;  Surgeon: Shaaron Lamar HERO, MD;  [facility];  Service: Endoscopy;  Laterality: N/A;   COSMETIC SURGERY  1980   ESOPHAGEAL DILATION N/A 04/30/2021   Procedure: ESOPHAGEAL DILATION;  Surgeon: Shaaron Lamar HERO, MD;   [facility];  Service: Endoscopy;  Laterality: N/A;   ESOPHAGEAL DILATION N/A 12/14/2023   Procedure: DILATION, ESOPHAGUS;  Surgeon: Cindie Carlin POUR, DO;  [facility];  Service: Endoscopy;  Laterality: N/A;   ESOPHAGOGASTRODUODENOSCOPY N/A 12/14/2023   Procedure: EGD (ESOPHAGOGASTRODUODENOSCOPY);  Surgeon: Cindie Carlin POUR, DO;  [facility];  Service: Endoscopy;  Laterality: N/A;  9:15 am, asa 3   ESOPHAGOGASTRODUODENOSCOPY (EGD) WITH PROPOFOL  N/A 04/30/2021   Procedure: ESOPHAGOGASTRODUODENOSCOPY (EGD) WITH PROPOFOL ;  Surgeon: Shaaron Lamar HERO, MD;  [facility];  Service: Endoscopy;  Laterality: N/A;   GIVENS CAPSULE STUDY N/A 09/22/2021   Procedure: GIVENS CAPSULE STUDY;  Surgeon: Shaaron Lamar HERO, MD;  [facility];  Service: Endoscopy;  Laterality: N/A;  7:30am   HERNIA REPAIR     LEFT HEART CATHETERIZATION WITH CORONARY ANGIOGRAM N/A 11/07/2013   Procedure: LEFT HEART CATHETERIZATION WITH CORONARY ANGIOGRAM;  Surgeon: Dorn JINNY Lesches, MD;  Location[facility] CATH LAB;  Service: Cardiovascular;  Laterality: N/A;   MASTECTOMY PARTIAL / LUMPECTOMY     OOPHORECTOMY     ROTATOR CUFF REPAIR     TOOTH EXTRACTION N/A 04/15/2022   Procedure: DENTAL RESTORATION/EXTRACTIONS;  Surgeon: Sheryle Hamilton, DMD;  Location: [facility] OR;  Service: Oral Surgery;  Laterality: N/A;   TOOTH EXTRACTION N/A 09/15/2023   Procedure: DENTAL RESTORATION/EXTRACTIONS;  Surgeon: Sheryle Hamilton, DMD;  Location: [facility] OR;  Service: Oral Surgery;  Laterality: N/A;    Current Outpatient Medications  Medication Sig Dispense Refill   HYDROcodone -acetaminophen  (NORCO) 10-325 MG tablet Take 1 tablet by mouth every 4 (four) hours as needed. Stop Hydrocodone  7.5/325mg  tablet. 180 tablet 0   ipratropium (ATROVENT  HFA) 17 MCG/ACT inhaler Inhale 2 puffs into the lungs every 4 (four) hours as needed (COPD). 1 each 4   isosorbide  dinitrate (ISORDIL ) 30 MG tablet Take 1 tablet (30 mg total)  by mouth daily. 90 tablet 1   JARDIANCE  10 MG TABS tablet TAKE 1 TABLET(10 MG) BY MOUTH DAILY BEFORE BREAKFAST 30 tablet 5   Lancets Misc. (ACCU-CHEK SOFTCLIX LANCET DEV) KIT USE TO CHECK GLUCOSE THREE TIMES DAILY 1 kit 0   LORazepam  (ATIVAN ) 0.5 MG tablet TAKE 1 TABLET(0.5 MG) BY MOUTH THREE TIMES DAILY AS NEEDED FOR ANXIETY 90 tablet 3   Magnesium  Hydroxide (MILK OF MAGNESIA PO) Take by mouth. Takes as needed     meclizine  (ANTIVERT ) 25 MG tablet TAKE 1 TABLET(25 MG) BY MOUTH THREE TIMES DAILY AS NEEDED FOR DIZZINESS 30 tablet 1   methocarbamol  (ROBAXIN ) 500 MG tablet TAKE 1 TABLET(500 MG) BY MOUTH EVERY 8 HOURS AS NEEDED FOR MUSCLE SPASMS 30 tablet 2   Misc. Devices KIT Henmnii rollator walker 1 kit 0   montelukast  (SINGULAIR ) 10 MG tablet TAKE 1 TABLET(10 MG) BY MOUTH AT BEDTIME 90 tablet 3   MOUNJARO  5 MG/0.5ML Pen ADMINISTER 5 MG UNDER  THE SKIN 1 TIME A WEEK 6 mL 1   nitroGLYCERIN  (NITROSTAT ) 0.4 MG SL tablet Place 1 tablet (0.4 mg total) under the tongue every 5 (five) minutes as needed for chest pain. 90 tablet 3   omeprazole  (PRILOSEC) 40 MG capsule Take 1 capsule (40 mg total) by mouth 2 (two) times daily. 180 capsule 3   ondansetron  (ZOFRAN -ODT) 8 MG disintegrating tablet DISSOLVE 1 TABLET EVERY 8 HOURS AS NEEDED FOR FOR NAUSEA AND VOMITING 257 tablet 1   STIOLTO RESPIMAT  2.5-2.5 MCG/ACT AERS INHALE 2 PUFFS INTO THE LUNGS DAILY 4 g 11   Vitamin D , Ergocalciferol , (DRISDOL ) 1.25 MG (50000 UNIT) CAPS capsule Take 1 capsule (50,000 Units total) by mouth every 7 (seven) days. 12 capsule 1   ACCU-CHEK GUIDE test strip USE TO CHECK BLOOD GLUCOSE THREE TIMES DAILY, MORNING, AT NOON, AND AT BEDTIME 100 strip 0   Accu-Chek Softclix Lancets lancets USE TO TEST BLOOD SUGAR EVERY MORNING, AT NOON AND EVERY NIGHT AT BEDTIME 100 each 0   acyclovir  (ZOVIRAX ) 400 MG tablet TAKE 1 TABLET(400 MG) BY MOUTH THREE TIMES DAILY (Patient taking differently: 400 mg 2 (two) times daily.) 90 tablet 3   albuterol   (PROVENTIL ) (2.5 MG/3ML) 0.083% nebulizer solution Take 3 mLs (2.5 mg total) by nebulization every 6 (six) hours as needed for wheezing or shortness of breath. 75 mL 5   ASPERCREME LIDOCAINE  EX Apply 1 Application topically 3 (three) times daily as needed (pain).     azithromycin  (ZITHROMAX ) 250 MG tablet Take 1 tablet (250 mg total) by mouth daily. 30 each 11   cetirizine  (ZYRTEC ) 10 MG tablet Take 10 mg by mouth 2 (two) times daily.     Continuous Glucose Receiver (DEXCOM G7 RECEIVER) DEVI USE TO CHECK BLOOD GLUCOSE AS NEEDED 1 each 0   Continuous Glucose Sensor (DEXCOM G7 SENSOR) MISC USE TO CHECK BLOOD SUGAR THREE TIMES DAILY, BEFORE MEALS AND AT BEDTIME AND AS NEEDED. CHANGE SENSOR EVERY 10 DAYS 3 each 5   dicyclomine  (BENTYL ) 10 MG capsule Take 1 capsule (10 mg total) by mouth 4 (four) times daily -  before meals and at bedtime. Monitor for constipation, dry mouth, dizziness (Patient not taking: Reported on 04/04/2024) 120 capsule 3   diphenhydrAMINE  (BENADRYL ) 25 MG tablet Take 50 mg by mouth daily as needed for allergies or itching.     doxepin  (SINEQUAN ) 10 MG capsule TAKE 1 CAPSULE(10 MG) BY MOUTH AT BEDTIME 30 capsule 3   DULoxetine  (CYMBALTA ) 60 MG capsule TAKE 1 CAPSULE(60 MG) BY MOUTH TWICE DAILY 60 capsule 3   ezetimibe  (ZETIA ) 10 MG tablet Take 1 tablet (10 mg total) by mouth daily. 90 tablet 3   gabapentin  (NEURONTIN ) 800 MG tablet Take 1 tablet (800 mg total) by mouth 3 (three) times daily. TAKE 1 TABLET(800 MG) BY MOUTH THREE TIMES DAILY 90 tablet 5   Glucagon  (GVOKE HYPOPEN  2-PACK) 1 MG/0.2ML SOAJ Inject 0.2 mLs into the skin as needed (Blood glucose < 53). 0.4 mL 5   Glycerin-Hypromellose-PEG 400 (DRY EYE RELIEF DROPS OP) Place 1 drop into both eyes 3 (three) times daily as needed (Dry eye).     XIFAXAN  550 MG TABS tablet TAKE 1 TABLET(550 MG) BY MOUTH THREE TIMES DAILY FOR 14 DAYS (Patient not taking: Reported on 04/18/2024) 42 tablet 0   No current facility-administered  medications for this visit.    Allergies as of 04/18/2024 - Review Complete 04/18/2024  Allergen Reaction Noted   Sulfa antibiotics Shortness Of Breath, Swelling,  and Rash 06/08/2011   Chicken allergy Diarrhea and Nausea And Vomiting 09/13/2023   Clindamycin /lincomycin Itching and Nausea And Vomiting 05/19/2022   Dilaudid  [hydromorphone  hcl]  10/31/2013   Ferrlecit [na ferric gluc cplx in sucrose] Itching 07/20/2023   Iron   08/31/2020   Metronidazole Diarrhea and Nausea And Vomiting 03/03/2015   Other  09/13/2023   Prednisone  Other (See Comments) 03/03/2015   Statins Other (See Comments) 10/07/2021   Tuna oil [fish oil] Diarrhea and Nausea And Vomiting 09/13/2023   Budesonide  Nausea And Vomiting and Rash 09/13/2023   Cephalexin Diarrhea and Nausea And Vomiting 03/03/2015   Methotrexate derivatives Swelling and Rash 06/08/2011   Morphine  and codeine Itching and Anxiety 11/07/2013   Tomato Itching and Rash 09/13/2023    Family History  Problem Relation Age of Onset   Lung cancer Mother    Cancer Mother    Miscarriages / Stillbirths Mother    Varicose Veins Mother    Hypertension Mother    Anxiety disorder Mother    Depression Mother    COPD Father    Lung cancer Father    Lymphoma Father    Alcohol  abuse Father    Arthritis Father    Cancer Father    Anxiety disorder Sister    Anxiety disorder Sister    Intellectual disability Sister    Depression Brother    Inflammatory bowel disease Neg Hx    Colon cancer Neg Hx     Social History   Socioeconomic History   Marital status: Widowed    Spouse name: Not on file   Number of children: Not on file   Years of education: Not on file   Highest education level: Some college, no degree  Occupational History   Not on file  Tobacco Use   Smoking status: Former    Current packs/day: 0.00    Average packs/day: 1 pack/day for 49.0 years (49.0 ttl pk-yrs)    Types: Cigarettes    Start date: 08/08/1966    Quit date: 08/31/2005     Years since quitting: 18.6   Smokeless tobacco: Never  Vaping Use   Vaping status: Never Used  Substance and Sexual Activity   Alcohol  use: No   Drug use: No    Comment: used marijuana in teens   Sexual activity: Not Currently    Birth control/protection: Surgical  Other Topics Concern   Not on file  Social History Narrative   Are you right handed or left handed? Left Handed    Are you currently employed ? Retired    What is your current occupation?   Do you live at home alone? Yes   Who lives with you?    What type of home do you live in: 1 story or 2 story? Lives in a one story home        Social Drivers of Health   Financial Resource Strain: Medium Risk (02/02/2024)   Overall Financial Resource Strain (CARDIA)    Difficulty of Paying Living Expenses: Somewhat hard  Food Insecurity: Food Insecurity Present (03/11/2024)   Hunger Vital Sign    Worried About Running Out of Food in the Last Year: Sometimes true    Ran Out of Food in the Last Year: Sometimes true  Transportation Needs: No Transportation Needs (03/11/2024)   PRAPARE - Administrator, Civil Service (Medical): No    Lack of Transportation (Non-Medical): No  Physical Activity: Insufficiently Active (08/10/2023)   Exercise Vital Sign  Days of Exercise per Week: 7 days    Minutes of Exercise per Session: 20 min  Stress: Stress Concern Present (08/10/2023)   [employer] of Occupational Health - Occupational Stress Questionnaire    Feeling of Stress : To some extent  Social Connections: Unknown (02/02/2024)   Social Connection and Isolation Panel    Frequency of Communication with Friends and Family: More than three times a week    Frequency of Social Gatherings with Friends and Family: Patient declined    Attends Religious Services: Patient declined    Database administrator or Organizations: Patient declined    Attends Banker Meetings: Not on file    Marital Status: Widowed  Intimate  Partner Violence: Not At Risk (03/11/2024)   Humiliation, Afraid, Rape, and Kick questionnaire    Fear of Current or Ex-Partner: No    Emotionally Abused: No    Physically Abused: No    Sexually Abused: No     Review of Systems   Gen: Denies any fever, chills, fatigue, weight loss, lack of appetite.  CV: Denies chest pain, heart palpitations, peripheral edema, syncope.  Resp: Denies shortness of breath at rest or with exertion. Denies wheezing or cough.  GI: see HPI GU : Denies urinary burning, urinary frequency, urinary hesitancy MS: Denies joint pain, muscle weakness, cramps, or limitation of movement.  Derm: Denies rash, itching, dry skin Psych: Denies depression, anxiety, memory loss, and confusion Heme: Denies bruising, bleeding, and enlarged lymph nodes.   Physical Exam   BP 132/74   Pulse 99   Temp 98.6 F (37 C)   Ht 5' 8 (1.727 m)   Wt 158 lb 9.6 oz (71.9 kg)   BMI 24.12 kg/m  General:   Alert and oriented. Pleasant and cooperative. Well-nourished and well-developed.  Head:  Normocephalic and atraumatic. Eyes:  Without icterus, no abrasion to right eye, no erythema, no obvious foreign material.  Abdomen:  +BS, soft, non-tender and non-distended. No HSM noted. No guarding or rebound. No masses appreciated.  Rectal:  Deferred  Msk:  Symmetrical without gross deformities. Normal posture. Extremities:  Without edema. Neurologic:  Alert and  oriented x4;  grossly normal neurologically. Skin:  Intact without significant lesions or rashes. Psych:  Alert and cooperative. Normal mood and affect.   Assessment   Kayla Ryan is a 68 y.o. female presenting today with a history of COPD, dermatomyositis, GERD, questionable Crohn's disease in the past but 2022 EGD/colonoscopy without Crohns,  capsule in 2023 unrevealing, IDA requiring transfusions in the past, chronic diarrhea, presenting in follow-up for dysphagia and fluctuating transaminases.   Dysphagia: s/p EGD with  dilation. Now with symptoms that seem predominantly oropharyngeal. Will pursue BPE. Continue with omeprazole  BID.  Elevated transaminases: US  abdomen with hepatic steatosis and splenomegaly. Hep C negative in 2023. No iron  overload. Recommending thorough serologies at this point. Depending on pattern of elevation, may need MRCP.   Chronic diarrhea was not addressed today and will be discussed at next visit.   PLAN    BPE Further extensive serologies for liver disease, rule out autoimmune/PBC, etc Follow-up 6-8 weeks to discuss    Therisa MICAEL Stager, PhD, ANP-BC [facility] Gastroenterology

## 2024-04-18 NOTE — Patient Instructions (Signed)
 We have arranged an xray of your esophagus.  Please have blood work done.  We will see you in 6-8 weeks to discuss!  I enjoyed seeing you again today! I value our relationship and want to provide genuine, compassionate, and quality care. You may receive a survey regarding your visit with me, and I welcome your feedback! Thanks so much for taking the time to complete this. I look forward to seeing you again.      Therisa MICAEL Stager, PhD, ANP-BC [facility] Gastroenterology

## 2024-04-21 ENCOUNTER — Other Ambulatory Visit: Payer: Self-pay | Admitting: Internal Medicine

## 2024-04-21 DIAGNOSIS — E1149 Type 2 diabetes mellitus with other diabetic neurological complication: Secondary | ICD-10-CM

## 2024-04-22 ENCOUNTER — Other Ambulatory Visit: Payer: Self-pay

## 2024-04-22 ENCOUNTER — Telehealth: Payer: Self-pay

## 2024-04-22 ENCOUNTER — Ambulatory Visit: Payer: Self-pay

## 2024-04-22 ENCOUNTER — Other Ambulatory Visit (HOSPITAL_COMMUNITY): Payer: Self-pay

## 2024-04-22 ENCOUNTER — Other Ambulatory Visit: Payer: Self-pay | Admitting: *Deleted

## 2024-04-22 ENCOUNTER — Telehealth: Payer: Self-pay | Admitting: Pharmacy Technician

## 2024-04-22 ENCOUNTER — Ambulatory Visit: Admitting: Family Medicine

## 2024-04-22 ENCOUNTER — Ambulatory Visit: Payer: Self-pay | Admitting: Gastroenterology

## 2024-04-22 DIAGNOSIS — R7989 Other specified abnormal findings of blood chemistry: Secondary | ICD-10-CM

## 2024-04-22 DIAGNOSIS — J9611 Chronic respiratory failure with hypoxia: Secondary | ICD-10-CM | POA: Diagnosis not present

## 2024-04-22 DIAGNOSIS — K760 Fatty (change of) liver, not elsewhere classified: Secondary | ICD-10-CM

## 2024-04-22 DIAGNOSIS — E1149 Type 2 diabetes mellitus with other diabetic neurological complication: Secondary | ICD-10-CM

## 2024-04-22 NOTE — Telephone Encounter (Signed)
 Rx sent.

## 2024-04-22 NOTE — Telephone Encounter (Signed)
 FYI Only or Action Required?: FYI only for provider.  Kayla Ryan was last seen in primary care on 02/06/2024 by Tobie Suzzane POUR, MD.  Called Nurse Triage reporting Back Pain.  Symptoms began a week ago.  Interventions attempted: Prescription medications: Robaxin , Gabapentin  and Oxycodone .  Symptoms are: unchanged.  Triage Disposition: See HCP Within 4 Hours (Or PCP Triage)  Kayla Ryan/caregiver understands and will follow disposition?: Yes                Copied from CRM #[id number]. Topic: Clinical - Red Word Triage >> Apr 22, 2024  8:42 AM Kayla Ryan wrote: Red Word that prompted transfer to Nurse Triage: Kayla Ryan calling, reports she is having severe sciatic pain. Kayla Ryan states she has been taking muscle relaxer, but it is not helping. States she has been icing, which helps for a few minutes, but then pain returns. Reason for Disposition  [1] SEVERE back pain (e.g., excruciating, unable to do any normal activities) AND [2] not improved 2 hours after pain medicine  Answer Assessment - Initial Assessment Questions 1. ONSET: When did the pain begin? (e.g., minutes, hours, days)     Tuesday at 5pm 2. LOCATION: Where does it hurt? (upper, mid or lower back)     Right side of lower 3. SEVERITY: How bad is the pain?  (e.g., Scale 1-10; mild, moderate, or severe)     severe 4. PATTERN: Is the pain constant? (e.g., yes, no; constant, intermittent)      Constant - gets better when icing 5. RADIATION: Does the pain shoot into your legs or somewhere else?     Down right leg 6. CAUSE:  What do you think is causing the back pain?      Sciatic pain -  7. BACK OVERUSE:  Any recent lifting of heavy objects, strenuous work or exercise?     Bent over 8. MEDICINES: What have you taken so far for the pain? (e.g., nothing, acetaminophen , NSAIDS)     Methocarbamol , and gabapentin  and oxycodone  9. NEUROLOGIC SYMPTOMS: Do you have any weakness, numbness, or problems with  bowel/bladder control?     Diarrhea on abx 10. OTHER SYMPTOMS: Do you have any other symptoms? (e.g., fever, abdomen pain, burning with urination, blood in urine)       No - right side abdominal pain - resolves with BM  Protocols used: Back Pain-A-AH

## 2024-04-22 NOTE — Patient Instructions (Signed)
 Visit Information  Thank you for taking time to visit with me today. Please don't hesitate to contact me if I can be of assistance to you before our next scheduled appointment.  Your next care management appointment is by telephone on 05-22-2024 at 11:00 am  Telephone follow-up in 1 month  Please call the care guide team at [phone] if you need to cancel, schedule, or reschedule an appointment.   Please call the Suicide and Crisis Lifeline: 988 call the [place]  National Suicide Prevention Lifeline: 7033920449 or TTY: 3363812539 TTY (317)516-6486) to talk to a trained counselor call 1-800-273-TALK (toll free, 24 hour hotline) if you are experiencing a Mental Health or Behavioral Health Crisis or need someone to talk to.  Rosina Forte, BSN RN [facility], [facility] RN Care Manager Direct Dial: [phone]  Fax: [phone]

## 2024-04-22 NOTE — Patient Outreach (Signed)
 Complex Care Management   Visit Note  04/22/2024  Name:  Kayla Ryan MRN: [id number] DOB: [birth year]  Situation: Referral received for Complex Care Management related to COPD and HLD I obtained verbal consent from Patient.  Visit completed with Patient  on the phone  Background:   Past Medical History:  Diagnosis Date   Allergy Unknown   Anemia    Anxiety 1883   Arthritis    Asthma 1980   CHF (congestive heart failure) (HCC) 2021   Chronic bronchitis    Crohn disease (HCC)    Emphysema    Emphysema of lung (HCC) 2003   Fibromyalgia    GERD (gastroesophageal reflux disease)    GI bleed    Herpes    History of blood transfusion    Hyperlipidemia    Hypertension 2015   IBS (irritable bowel syndrome)    Kidney failure    Migraines    Muscular dystrophy (HCC)    Neck pain    Oxygen  deficiency 2021   PAF (paroxysmal atrial fibrillation) (HCC)    Not anticoagulated with history of severe GI bleeding   Plantar fasciitis    Polymyositis (HCC)    PONV (postoperative nausea and vomiting)    Right thyroid  nodule 04/07/2022   Scoliosis    Type 2 diabetes mellitus (HCC)     Assessment: Patient Reported Symptoms:  Cognitive Cognitive Status: No symptoms reported Cognitive/Intellectual Conditions Management [RPT]: None reported or documented in medical history or problem list   Health Maintenance Behaviors: Annual physical exam Healing Pattern: Average Health Facilitated by: Rest  Neurological Neurological Review of Symptoms: Dizziness Neurological Self-Management Outcome: 3 (uncertain)  HEENT HEENT Symptoms Reported: Nasal discharge      Cardiovascular Cardiovascular Symptoms Reported: Dizziness    Respiratory Respiratory Symptoms Reported: Productive cough, Wheezing, Shortness of breath Additional Respiratory Details: currently on 30  day azithromycin  regime due to acute exacerbation Respiratory Management Strategies: Medication therapy  Endocrine Endocrine  Symptoms Reported: No symptoms reported Is patient diabetic?: No Is patient checking blood sugars at home?: No    Gastrointestinal Gastrointestinal Symptoms Reported: Diarrhea Gastrointestinal Management Strategies: Coping strategies Gastrointestinal Self-Management Outcome: 4 (good)    Genitourinary Genitourinary Symptoms Reported: No symptoms reported Genitourinary Self-Management Outcome: 4 (good)  Integumentary Integumentary Symptoms Reported: No symptoms reported    Musculoskeletal Musculoskelatal Symptoms Reviewed: Back pain Musculoskeletal Management Strategies: Coping strategies, Medication therapy Musculoskeletal Self-Management Outcome: 3 (uncertain) Musculoskeletal Comment: acute follow up today for back pain Falls in the past year?: Yes Number of falls in past year: 2 or more Was there an injury with Fall?: Yes Fall Risk Category Calculator: 3 Patient Fall Risk Level: High Fall Risk Patient at Risk for Falls Due to: History of fall(s), Impaired balance/gait, Impaired mobility Fall risk Follow up: Falls evaluation completed, Education provided  Psychosocial Psychosocial Symptoms Reported: No symptoms reported Behavioral Management Strategies: Coping strategies Behavioral Health Self-Management Outcome: 4 (good) Major Change/Loss/Stressor/Fears (CP): Denies Techniques to Cope with Loss/Stress/Change: Not applicable Quality of Family Relationships: supportive Do you feel physically threatened by others?: No    04/22/2024    PHQ2-9 Depression Screening   Little interest or pleasure in doing things Not at all  Feeling down, depressed, or hopeless Not at all  PHQ-2 - Total Score 0  Trouble falling or staying asleep, or sleeping too much    Feeling tired or having little energy    Poor appetite or overeating     Feeling bad about yourself - or that you are  a failure or have let yourself or your family down    Trouble concentrating on things, such as reading the newspaper  or watching television    Moving or speaking so slowly that other people could have noticed.  Or the opposite - being so fidgety or restless that you have been moving around a lot more than usual    Thoughts that you would be better off dead, or hurting yourself in some way    PHQ2-9 Total Score    If you checked off any problems, how difficult have these problems made it for you to do your work, take care of things at home, or get along with other people    Depression Interventions/Treatment      Vitals:   04/22/24 1045  SpO2: 94%    Medications Reviewed Today     Reviewed by Bertrum Rosina HERO, RN (Registered Nurse) on 04/22/24 at 1040  Med List Status: <None>   Medication Order Taking? Sig Documenting Provider Last Dose Status Informant  ACCU-CHEK GUIDE test strip [id number] Yes USE TO CHECK BLOOD GLUCOSE THREE TIMES DAILY, MORNING, AT NOON, AND AT BEDTIME Tobie Suzzane POUR, MD  Active Self  Accu-Chek Softclix Lancets lancets [id number] Yes USE TO TEST BLOOD SUGAR EVERY MORNING, AT NOON AND EVERY NIGHT AT BEDTIME Tobie Suzzane POUR, MD  Active Self  acyclovir  (ZOVIRAX ) 400 MG tablet [id number] Yes TAKE 1 TABLET(400 MG) BY MOUTH THREE TIMES DAILY  Patient taking differently: 400 mg 2 (two) times daily.   Tobie Suzzane POUR, MD  Active   albuterol  (PROVENTIL ) (2.5 MG/3ML) 0.083% nebulizer solution [id number] Yes Take 3 mLs (2.5 mg total) by nebulization every 6 (six) hours as needed for wheezing or shortness of breath. Orlie Madelin RAMAN, NP  Active Self  ASPERCREME LIDOCAINE  EX [id number] Yes Apply 1 Application topically 3 (three) times daily as needed (pain). [provider]  Active Self  azithromycin  (ZITHROMAX ) 250 MG tablet [id number] Yes Take 1 tablet (250 mg total) by mouth daily. Byrum, Robert S, MD  Active   cetirizine  (ZYRTEC ) 10 MG tablet [id number] Yes Take 10 mg by mouth 2 (two) times daily. [provider]  Active Self  Continuous Glucose Receiver (DEXCOM G7 RECEIVER) ESPIRIDION  [id number] Yes USE TO CHECK BLOOD GLUCOSE AS NEEDED Tobie Suzzane POUR, MD  Consider Medication Status and Discontinue Self  Continuous Glucose Sensor (DEXCOM G7 SENSOR) MISC [id number] Yes USE TO CHECK BLOOD SUGAR THREE TIMES DAILY, BEFORE MEALS AND AT BEDTIME AND AS NEEDED. CHANGE SENSOR EVERY 10 DAYS Tobie Suzzane POUR, MD  Active   dicyclomine  (BENTYL ) 10 MG capsule [id number]  Take 1 capsule (10 mg total) by mouth 4 (four) times daily -  before meals and at bedtime. Monitor for constipation, dry mouth, dizziness  Patient not taking: Reported on 04/22/2024   Shirlean Therisa ORN, NP  Consider Medication Status and Discontinue   diphenhydrAMINE  (BENADRYL ) 25 MG tablet [id number] Yes Take 50 mg by mouth daily as needed for allergies or itching. [provider]  Active Self  doxepin  (SINEQUAN ) 10 MG capsule [id number] Yes TAKE 1 CAPSULE(10 MG) BY MOUTH AT BEDTIME Tobie Suzzane POUR, MD  Active   DULoxetine  (CYMBALTA ) 60 MG capsule [id number] Yes TAKE 1 CAPSULE(60 MG) BY MOUTH TWICE DAILY Patel, Rutwik K, MD  Active   ezetimibe  (ZETIA ) 10 MG tablet [id number] Yes Take 1 tablet (10 mg total) by mouth daily. Debera Jayson MATSU, MD  Active   gabapentin  (NEURONTIN ) 800 MG tablet [id number] Yes Take 1  tablet (800 mg total) by mouth 3 (three) times daily. TAKE 1 TABLET(800 MG) BY MOUTH THREE TIMES DAILY Patel, Rutwik K, MD  Active   Glucagon  (GVOKE HYPOPEN  2-PACK) 1 MG/0.2ML SOAJ [id number] Yes Inject 0.2 mLs into the skin as needed (Blood glucose < 53). Tobie Suzzane POUR, MD  Active Self  Glycerin-Hypromellose-PEG 400 (DRY EYE RELIEF DROPS OP) [id number] Yes Place 1 drop into both eyes 3 (three) times daily as needed (Dry eye). [provider]  Active Self  HYDROcodone -acetaminophen  (NORCO) 10-325 MG tablet [id number] Yes Take 1 tablet by mouth every 4 (four) hours as needed. Stop Hydrocodone  7.5/325mg  tablet.   Active Self  ipratropium (ATROVENT  HFA) 17 MCG/ACT inhaler [id number] Yes Inhale 2 puffs into the lungs every  4 (four) hours as needed (COPD). Shelah Lamar RAMAN, MD  Active Self  isosorbide  dinitrate (ISORDIL ) 30 MG tablet [id number] Yes Take 1 tablet (30 mg total) by mouth daily. Tobie Suzzane POUR, MD  Active   JARDIANCE  10 MG TABS tablet [id number] Yes TAKE 1 TABLET(10 MG) BY MOUTH DAILY BEFORE BREAKFAST Patel, Rutwik K, MD  Active   Lancets Misc. (ACCU-CHEK SOFTCLIX LANCET DEV) KIT [id number] Yes USE TO CHECK GLUCOSE THREE TIMES DAILY Tobie Suzzane POUR, MD  Active Self  LORazepam  (ATIVAN ) 0.5 MG tablet [id number] Yes TAKE 1 TABLET(0.5 MG) BY MOUTH THREE TIMES DAILY AS NEEDED FOR ANXIETY Patel, Rutwik K, MD  Active   Magnesium  Hydroxide (MILK OF MAGNESIA PO) [id number] Yes Take by mouth. Takes as needed [provider]  Active   meclizine  (ANTIVERT ) 25 MG tablet [id number] Yes TAKE 1 TABLET(25 MG) BY MOUTH THREE TIMES DAILY AS NEEDED FOR DIZZINESS Patel, Rutwik K, MD  Active   methocarbamol  (ROBAXIN ) 500 MG tablet [id number] Yes TAKE 1 TABLET(500 MG) BY MOUTH EVERY 8 HOURS AS NEEDED FOR MUSCLE SPASMS Tobie Suzzane POUR, MD  Active   Misc. Devices KIT [id number] Yes Henmnii rollator walker Tobie Suzzane POUR, MD  Active Self  montelukast  (SINGULAIR ) 10 MG tablet [id number] Yes TAKE 1 TABLET(10 MG) BY MOUTH AT BEDTIME Shelah Lamar RAMAN, MD  Active   MOUNJARO  5 MG/0.5ML Pen [id number] Yes ADMINISTER 5 MG UNDER THE SKIN 1 TIME A WEEK Tobie, Suzzane POUR, MD  Active   nitroGLYCERIN  (NITROSTAT ) 0.4 MG SL tablet [id number] Yes Place 1 tablet (0.4 mg total) under the tongue every 5 (five) minutes as needed for chest pain. Debera Jayson MATSU, MD  Active Self  omeprazole  (PRILOSEC) 40 MG capsule [id number] Yes Take 1 capsule (40 mg total) by mouth 2 (two) times daily. Tobie Suzzane POUR, MD  Active   ondansetron  (ZOFRAN -ODT) 8 MG disintegrating tablet [id number] Yes DISSOLVE 1 TABLET EVERY 8 HOURS AS NEEDED FOR FOR NAUSEA AND VOMITING Rogers Hai, MD  Active   STIOLTO RESPIMAT  2.5-2.5 MCG/ACT AERS [id number] Yes INHALE 2 PUFFS INTO  THE LUNGS DAILY Tobie Suzzane POUR, MD  Active Self  Vitamin D , Ergocalciferol , (DRISDOL ) 1.25 MG (50000 UNIT) CAPS capsule [id number] Yes Take 1 capsule (50,000 Units total) by mouth every 7 (seven) days. Tobie Suzzane POUR, MD  Active   XIFAXAN  550 MG TABS tablet [id number]  TAKE 1 TABLET(550 MG) BY MOUTH THREE TIMES DAILY FOR 14 DAYS  Patient not taking: Reported on 04/22/2024   Shirlean Therisa ORN, NP  Consider Medication Status and Discontinue             Recommendation:   Continue Current Plan of Care  Follow Up Plan:   Telephone follow-up in 1 month  Rosina  Bertrum, BSN RN [facility], [facility] RN Care Manager Direct Dial: [phone]  Fax: [phone]

## 2024-04-22 NOTE — Telephone Encounter (Signed)
 Copied from CRM #[id number]. Topic: Clinical - Medication Question >> Apr 22, 2024  8:38 AM Treva T wrote: Reason for CRM: Patient calling, states pharmacy faxed over a refill request for Dexcom Sensor, and has not received a response fro office.  Per patient, sensor will need to be changed on tomorrow. Patient reports she is out and sensor will need to be replaced. Requesting a follow up regarding refill request.  Patient can be reached at [phone].  Medication:  Continuous Glucose Sensor (DEXCOM G7 SENSOR)   Pharmacy: [facility] [phone] - [place], [place] - [address] [place] [address] Phone: [phone] Fax: [phone]

## 2024-04-22 NOTE — Telephone Encounter (Signed)
 Appt made.

## 2024-04-22 NOTE — Telephone Encounter (Signed)
 Pharmacy Patient Advocate Encounter   Received notification from CoverMyMeds that prior authorization for Dexcom G7 Sensor is required/requested.   Insurance verification completed.   The patient is insured through [employer] .   Per test claim: PA required; PA submitted to above mentioned insurance via Latent Key/confirmation #/EOC [id number] Status is pending

## 2024-04-23 ENCOUNTER — Other Ambulatory Visit: Payer: Self-pay | Admitting: Internal Medicine

## 2024-04-23 ENCOUNTER — Telehealth: Payer: Self-pay | Admitting: Pharmacist

## 2024-04-23 ENCOUNTER — Telehealth: Payer: Self-pay

## 2024-04-23 ENCOUNTER — Ambulatory Visit: Payer: 59

## 2024-04-23 VITALS — Ht 68.0 in | Wt 154.0 lb

## 2024-04-23 DIAGNOSIS — Z1231 Encounter for screening mammogram for malignant neoplasm of breast: Secondary | ICD-10-CM

## 2024-04-23 DIAGNOSIS — I10 Essential (primary) hypertension: Secondary | ICD-10-CM

## 2024-04-23 DIAGNOSIS — Z758 Other problems related to medical facilities and other health care: Secondary | ICD-10-CM

## 2024-04-23 DIAGNOSIS — Z599 Problem related to housing and economic circumstances, unspecified: Secondary | ICD-10-CM

## 2024-04-23 DIAGNOSIS — Z0000 Encounter for general adult medical examination without abnormal findings: Secondary | ICD-10-CM | POA: Diagnosis not present

## 2024-04-23 DIAGNOSIS — I5032 Chronic diastolic (congestive) heart failure: Secondary | ICD-10-CM

## 2024-04-23 DIAGNOSIS — I779 Disorder of arteries and arterioles, unspecified: Secondary | ICD-10-CM

## 2024-04-23 MED ORDER — BLOOD PRESSURE MONITOR 3 DEVI: 0 refills | Status: AC

## 2024-04-23 NOTE — Telephone Encounter (Signed)
 Copied from CRM [id number]. Topic: General - Other >> Apr 23, 2024 11:24 AM Rosaria BRAVO wrote: Reason for CRM: Pt needs a diagnostic mammogram since she has formerly had breast surgery, instead of just a regular mammogram. Wants this for [facility].

## 2024-04-23 NOTE — Addendum Note (Signed)
 Addended by: ALLYSON PROFFER A on: 04/23/2024 02:05 PM   Modules accepted: Orders

## 2024-04-23 NOTE — Telephone Encounter (Signed)
 I see hypoglycemia noted in the chart. The insurance plan is requesting the following criteria be met:  Documentation of a history of problematic hypoglycemia with recurrent Level 2 events (glucose <54 mg/dL [6.9 mmol/L]) that persist despite multiple attempts to adjust medications and/or modify the diabetes treatment plan, or  Documentation of a history of problematic hypoglycemia with at least one Level 3 event (glucose <54 mg/dL [6.9 mmol/L]) characterized by altered mental and/or physical status requiring third-party assistance for treatment.  Is there any documentation available that could support the above request to strengthen our appeal?  Thank you, Devere Pandy, PharmD Clinical Pharmacist  [place]  Direct Dial: [phone]

## 2024-04-23 NOTE — Progress Notes (Signed)
 Subjective:   Kayla Ryan is a 68 y.o. who presents for a Medicare Wellness preventive visit. As a reminder, Annual Wellness Visits don't include a physical exam, and some assessments may be limited, especially if this visit is performed virtually. We may recommend an in-person follow-up visit with your provider if needed. Visit Complete: Virtual I connected with  AUDREA BOLTE on 04/23/24 by a audio enabled telemedicine application and verified that I am speaking with the correct person using two identifiers.  Patient Location: Home  Provider Location: Home Office  I discussed the limitations of evaluation and management by telemedicine. The patient expressed understanding and agreed to proceed.  Vital Signs: Because this visit was a virtual/telehealth visit, some criteria may be missing or patient reported. Any vitals not documented were not able to be obtained and vitals that have been documented are patient reported. VideoDeclined- This patient declined Librarian, academic. Therefore the visit was completed with audio only. Persons Participating in Visit: Patient. AWV Questionnaire: Yes: Patient Medicare AWV questionnaire was completed by the patient on 04/19/2024; I have confirmed that all information answered by patient is correct and no changes since this date.      Objective:    Today's Vitals   04/19/24 0858 04/23/24 1044  Weight:  154 lb (69.9 kg)  Height:  5' 8 (1.727 m)  PainSc: 8  8   PainLoc:  Back   Body mass index is 23.42 kg/m.    04/23/2024   10:43 AM 03/25/2024   10:44 AM 03/11/2024    1:19 PM 01/30/2024    3:09 PM 12/14/2023    9:03 AM 12/12/2023    4:25 PM 10/06/2023   10:28 AM  Advanced Directives  Does Patient Have a Medical Advance Directive? No No No No No No No  Would patient like information on creating a medical advance directive? No - Patient declined Yes (MAU/Ambulatory/Procedural Areas - Information given) Yes  (MAU/Ambulatory/Procedural Areas - Information given) No - Patient declined No - Patient declined No - Patient declined No - Patient declined   Current Medications (verified) Outpatient Encounter Medications as of 04/23/2024  Medication Sig   ACCU-CHEK GUIDE test strip USE TO CHECK BLOOD GLUCOSE THREE TIMES DAILY, MORNING, AT NOON, AND AT BEDTIME   Accu-Chek Softclix Lancets lancets USE TO TEST BLOOD SUGAR EVERY MORNING, AT NOON AND EVERY NIGHT AT BEDTIME   acyclovir  (ZOVIRAX ) 400 MG tablet TAKE 1 TABLET(400 MG) BY MOUTH THREE TIMES DAILY (Patient taking differently: 400 mg 2 (two) times daily.)   albuterol  (PROVENTIL ) (2.5 MG/3ML) 0.083% nebulizer solution Take 3 mLs (2.5 mg total) by nebulization every 6 (six) hours as needed for wheezing or shortness of breath.   ASPERCREME LIDOCAINE  EX Apply 1 Application topically 3 (three) times daily as needed (pain).   azithromycin  (ZITHROMAX ) 250 MG tablet Take 1 tablet (250 mg total) by mouth daily.   cetirizine  (ZYRTEC ) 10 MG tablet Take 10 mg by mouth 2 (two) times daily.   Continuous Glucose Receiver (DEXCOM G7 RECEIVER) DEVI USE TO CHECK BLOOD GLUCOSE AS NEEDED   Continuous Glucose Sensor (DEXCOM G7 SENSOR) MISC USE TO CHECK BLOOD SUGAR THREE TIMES DAILY, BEFORE MEALS AND AT BEDTIME AND AS NEEDED. CHANGE SENSOR EVERY 10 DAYS   diphenhydrAMINE  (BENADRYL ) 25 MG tablet Take 50 mg by mouth daily as needed for allergies or itching.   doxepin  (SINEQUAN ) 10 MG capsule TAKE 1 CAPSULE(10 MG) BY MOUTH AT BEDTIME   DULoxetine  (CYMBALTA ) 60 MG capsule  TAKE 1 CAPSULE(60 MG) BY MOUTH TWICE DAILY   ezetimibe  (ZETIA ) 10 MG tablet Take 1 tablet (10 mg total) by mouth daily.   gabapentin  (NEURONTIN ) 800 MG tablet Take 1 tablet (800 mg total) by mouth 3 (three) times daily. TAKE 1 TABLET(800 MG) BY MOUTH THREE TIMES DAILY   Glucagon  (GVOKE HYPOPEN  2-PACK) 1 MG/0.2ML SOAJ Inject 0.2 mLs into the skin as needed (Blood glucose < 53).   Glycerin-Hypromellose-PEG 400 (DRY  EYE RELIEF DROPS OP) Place 1 drop into both eyes 3 (three) times daily as needed (Dry eye).   HYDROcodone -acetaminophen  (NORCO) 10-325 MG tablet Take 1 tablet by mouth every 4 (four) hours as needed. Stop Hydrocodone  7.5/325mg  tablet.   ipratropium (ATROVENT  HFA) 17 MCG/ACT inhaler Inhale 2 puffs into the lungs every 4 (four) hours as needed (COPD).   isosorbide  dinitrate (ISORDIL ) 30 MG tablet Take 1 tablet (30 mg total) by mouth daily.   JARDIANCE  10 MG TABS tablet TAKE 1 TABLET(10 MG) BY MOUTH DAILY BEFORE BREAKFAST   Lancets Misc. (ACCU-CHEK SOFTCLIX LANCET DEV) KIT USE TO CHECK GLUCOSE THREE TIMES DAILY   LORazepam  (ATIVAN ) 0.5 MG tablet TAKE 1 TABLET(0.5 MG) BY MOUTH THREE TIMES DAILY AS NEEDED FOR ANXIETY   Magnesium  Hydroxide (MILK OF MAGNESIA PO) Take by mouth. Takes as needed   meclizine  (ANTIVERT ) 25 MG tablet TAKE 1 TABLET(25 MG) BY MOUTH THREE TIMES DAILY AS NEEDED FOR DIZZINESS   methocarbamol  (ROBAXIN ) 500 MG tablet TAKE 1 TABLET(500 MG) BY MOUTH EVERY 8 HOURS AS NEEDED FOR MUSCLE SPASMS   Misc. Devices KIT Henmnii rollator walker   montelukast  (SINGULAIR ) 10 MG tablet TAKE 1 TABLET(10 MG) BY MOUTH AT BEDTIME   MOUNJARO  5 MG/0.5ML Pen ADMINISTER 5 MG UNDER THE SKIN 1 TIME A WEEK   nitroGLYCERIN  (NITROSTAT ) 0.4 MG SL tablet Place 1 tablet (0.4 mg total) under the tongue every 5 (five) minutes as needed for chest pain.   omeprazole  (PRILOSEC) 40 MG capsule Take 1 capsule (40 mg total) by mouth 2 (two) times daily.   ondansetron  (ZOFRAN -ODT) 8 MG disintegrating tablet DISSOLVE 1 TABLET EVERY 8 HOURS AS NEEDED FOR FOR NAUSEA AND VOMITING   STIOLTO RESPIMAT  2.5-2.5 MCG/ACT AERS INHALE 2 PUFFS INTO THE LUNGS DAILY   Vitamin D , Ergocalciferol , (DRISDOL ) 1.25 MG (50000 UNIT) CAPS capsule Take 1 capsule (50,000 Units total) by mouth every 7 (seven) days.   dicyclomine  (BENTYL ) 10 MG capsule Take 1 capsule (10 mg total) by mouth 4 (four) times daily -  before meals and at bedtime. Monitor for  constipation, dry mouth, dizziness (Patient not taking: Reported on 04/23/2024)   XIFAXAN  550 MG TABS tablet TAKE 1 TABLET(550 MG) BY MOUTH THREE TIMES DAILY FOR 14 DAYS (Patient not taking: Reported on 04/23/2024)   No facility-administered encounter medications on file as of 04/23/2024.   Allergies (verified) Sulfa antibiotics, Chicken allergy, Clindamycin /lincomycin, Dilaudid  [hydromorphone  hcl], Ferrlecit [na ferric gluc cplx in sucrose], Iron , Metronidazole, Other, Prednisone , Statins, Tuna oil [fish oil], Budesonide , Cephalexin, Methotrexate derivatives, Morphine  and codeine, and Tomato  History: Past Medical History:  Diagnosis Date   Allergy Unknown   Anemia    Anxiety 1883   Arthritis    Asthma 1980   CHF (congestive heart failure) (HCC) 2021   Chronic bronchitis    Crohn disease (HCC)    Emphysema    Emphysema of lung (HCC) 2003   Fibromyalgia    GERD (gastroesophageal reflux disease)    GI bleed    Herpes    History  of blood transfusion    Hyperlipidemia    Hypertension 2015   IBS (irritable bowel syndrome)    Kidney failure    Migraines    Muscular dystrophy (HCC)    Neck pain    Oxygen  deficiency 2021   PAF (paroxysmal atrial fibrillation) (HCC)    Not anticoagulated with history of severe GI bleeding   Plantar fasciitis    Polymyositis (HCC)    PONV (postoperative nausea and vomiting)    Right thyroid  nodule 04/07/2022   Scoliosis    Type 2 diabetes mellitus (HCC)    Past Surgical History:  Procedure Laterality Date   ABDOMINAL HYSTERECTOMY     AGILE CAPSULE N/A 06/24/2021   Procedure: AGILE CAPSULE;  Surgeon: Cindie Carlin POUR, DO;  [facility];  Service: Endoscopy;  Laterality: N/A;   AGILE CAPSULE N/A 07/22/2021   Procedure: AGILE CAPSULE;  Surgeon: Shaaron Lamar HERO, MD;  Location: [facility];  Service: Endoscopy;  Laterality: N/A;  7:30am   bone spur     BREAST SURGERY  1995   CHOLECYSTECTOMY     COLON SURGERY  1995   COLONOSCOPY WITH  PROPOFOL  N/A 04/30/2021   Procedure: COLONOSCOPY WITH PROPOFOL ;  Surgeon: Shaaron Lamar HERO, MD;  [facility]: [facility];  Service: Endoscopy;  Laterality: N/A;   COSMETIC SURGERY  1980   ESOPHAGEAL DILATION N/A 04/30/2021   Procedure: ESOPHAGEAL DILATION;  Surgeon: Shaaron Lamar HERO, MD;  [facility];  Service: Endoscopy;  Laterality: N/A;   ESOPHAGEAL DILATION N/A 12/14/2023   Procedure: DILATION, ESOPHAGUS;  Surgeon: Cindie Carlin POUR, DO;  [facility];  Service: Endoscopy;  Laterality: N/A;   ESOPHAGOGASTRODUODENOSCOPY N/A 12/14/2023   Procedure: EGD (ESOPHAGOGASTRODUODENOSCOPY);  Surgeon: Cindie Carlin POUR, DO;  [facility];  Service: Endoscopy;  Laterality: N/A;  9:15 am, asa 3   ESOPHAGOGASTRODUODENOSCOPY (EGD) WITH PROPOFOL  N/A 04/30/2021   Procedure: ESOPHAGOGASTRODUODENOSCOPY (EGD) WITH PROPOFOL ;  Surgeon: Shaaron Lamar HERO, MD;  [facility];  Service: Endoscopy;  Laterality: N/A;   GIVENS CAPSULE STUDY N/A 09/22/2021   Procedure: GIVENS CAPSULE STUDY;  Surgeon: Shaaron Lamar HERO, MD;  [facility];  Service: Endoscopy;  Laterality: N/A;  7:30am   HERNIA REPAIR     LEFT HEART CATHETERIZATION WITH CORONARY ANGIOGRAM N/A 11/07/2013   Procedure: LEFT HEART CATHETERIZATION WITH CORONARY ANGIOGRAM;  Surgeon: Dorn JINNY Lesches, MD;  Location: [facility];  Service: Cardiovascular;  Laterality: N/A;   MASTECTOMY PARTIAL / LUMPECTOMY     OOPHORECTOMY     ROTATOR CUFF REPAIR     TOOTH EXTRACTION N/A 04/15/2022   Procedure: DENTAL RESTORATION/EXTRACTIONS;  Surgeon: Sheryle Hamilton, DMD;  Location: [facility];  Service: Oral Surgery;  Laterality: N/A;   TOOTH EXTRACTION N/A 09/15/2023   Procedure: DENTAL RESTORATION/EXTRACTIONS;  Surgeon: Sheryle Hamilton, DMD;  Location: [facility];  Service: Oral Surgery;  Laterality: N/A;   Family History  Problem Relation Age of Onset   Lung cancer Mother    Cancer Mother    Miscarriages / [place] Mother     Varicose Veins Mother    Hypertension Mother    Anxiety disorder Mother    Depression Mother    COPD Father    Lung cancer Father    Lymphoma Father    Alcohol abuse Father    Arthritis Father    Cancer Father    Anxiety disorder Sister    Anxiety disorder Sister    Intellectual disability Sister  Depression Brother    Inflammatory bowel disease Neg Hx    Colon cancer Neg Hx    Social History   Socioeconomic History   Marital status: Widowed    Spouse name: Not on file   Number of children: Not on file   Years of education: Not on file   Highest education level: Some college, no degree  Occupational History   Not on file  Tobacco Use   Smoking status: Former    Current packs/day: 0.00    Average packs/day: 1 pack/day for 49.0 years (49.0 ttl pk-yrs)    Types: Cigarettes    Start date: 08/08/1966    Quit date: 08/31/2005    Years since quitting: 18.6   Smokeless tobacco: Never   Tobacco comments:    It has been 16 years since I smoked a cigarette!!!  Vaping Use   Vaping status: Never Used  Substance and Sexual Activity   Alcohol use: No   Drug use: No    Comment: used marijuana in teens   Sexual activity: Not Currently    Birth control/protection: Abstinence, Surgical  Other Topics Concern   Not on file  Social History Narrative   Are you right handed or left handed? Left Handed    Are you currently employed ? Retired    What is your current occupation?   Do you live at home alone? Yes   Who lives with you?    What type of home do you live in: 1 story or 2 story? Lives in a one story home        Social Drivers of Health   Financial Resource Strain: Medium Risk (02/02/2024)   Overall Financial Resource Strain (CARDIA)    Difficulty of Paying Living Expenses: Somewhat hard  Food Insecurity: Food Insecurity Present (03/11/2024)   Hunger Vital Sign    Worried About Running Out of Food in the Last Year: Sometimes true    Ran Out of Food in the Last Year:  Sometimes true  Transportation Needs: No Transportation Needs (03/11/2024)   PRAPARE - Administrator, Civil Service (Medical): No    Lack of Transportation (Non-Medical): No  Physical Activity: Insufficiently Active (08/10/2023)   Exercise Vital Sign    Days of Exercise per Week: 7 days    Minutes of Exercise per Session: 20 min  Stress: Stress Concern Present (08/10/2023)   [employer] of Occupational Health - Occupational Stress Questionnaire    Feeling of Stress : To some extent  Social Connections: Unknown (02/02/2024)   Social Connection and Isolation Panel    Frequency of Communication with Friends and Family: More than three times a week    Frequency of Social Gatherings with Friends and Family: Patient declined    Attends Religious Services: Patient declined    Database administrator or Organizations: Patient declined    Attends Banker Meetings: Not on file    Marital Status: Widowed   Tobacco Counseling Counseling given: Yes Tobacco comments: It has been 16 years since I smoked a cigarette!!!  Clinical Intake: Pre-visit preparation completed: Yes Pain : 0-10 Pain Score: 8  Pain Type: Acute pain Pain Location: Back Pain Orientation: Lower Pain Onset: 1 to 4 weeks ago Pain Frequency: Constant   BMI - recorded: 23.42 Nutritional Risks: None Diabetes: Yes CBG done?: No (telehealth visit.) Did pt. bring in CBG monitor from home?: No Lab Results  Component Value Date   HGBA1C 5.4 01/23/2024   HGBA1C  5.5 08/14/2023   HGBA1C 5.5 04/11/2023    How often do you need to have someone help you when you read instructions, pamphlets, or other written materials from your doctor or pharmacy?: 1 - Never Interpreter Needed?: No Information entered by :: Mishawn Didion W CMA (AAMA) Activities of Daily Living     04/19/2024    8:58 AM 09/15/2023    6:48 AM  In your present state of health, do you have any difficulty performing the following activities:  Hearing?  0   Vision? 0   Difficulty concentrating or making decisions? 0   Walking or climbing stairs? 0   Dressing or bathing? 0   Doing errands, shopping? 0 0  Preparing Food and eating ? N   Using the Toilet? N   In the past six months, have you accidently leaked urine? Y   Do you have problems with loss of bowel control? N   Managing your Medications? N   Managing your Finances? N   Housekeeping or managing your Housekeeping? Y     Patient Care Team: Tobie Suzzane POUR, MD as PCP - General (Internal Medicine) Debera Jayson MATSU, MD as PCP - Cardiology (Cardiology) Tobie Tonita POUR, DO as Consulting Physician (Neurology) Nieves Cough, MD as Consulting Physician (Urology) Shelah Lamar RAMAN, MD as Consulting Physician (Pulmonary Disease) [facility]rum Rosina HERO, RN as VBCI Care Management  I have updated your Care Teams any recent Medical Services you may have received from other providers in the past year.     Assessment:   This is a routine wellness examination for Jakeya.  Hearing/Vision screen Hearing Screening - Comments:: Patient denies any hearing difficulties.     Goals Addressed               This Visit's Progress     COMPLETED: Patient Stated        Get teeth.      Patient Stated (pt-stated)   On track     I want to stay out of the hospital        Depression Screen     04/22/2024   10:48 AM 03/25/2024   10:51 AM 03/11/2024    1:40 PM 02/06/2024   10:21 AM 01/30/2024    3:05 PM 12/13/2023    2:40 PM 10/04/2023    1:59 PM  PHQ 2/9 Scores  PHQ - 2 Score 0 0 0 0 0 0 0  PHQ- 9 Score    0  0     Fall Risk     04/22/2024   10:47 AM 04/19/2024    8:58 AM 03/25/2024   10:49 AM 03/11/2024    1:34 PM 02/06/2024   10:27 AM  Fall Risk   Falls in the past year? 1 1 1  1   Number falls in past yr: 1 1 1 1 1   Injury with Fall? 1 1 1 1 1   Risk for fall due to : History of fall(s);Impaired balance/gait;Impaired mobility Impaired  balance/gait;Impaired mobility History of fall(s) History of fall(s) History of fall(s)  Follow up Falls evaluation completed;Education provided Falls evaluation completed;Education provided;Falls prevention discussed Falls evaluation completed;Education provided Falls evaluation completed;Education provided Falls evaluation completed    MEDICARE RISK AT HOME:  Medicare Risk at Home Any stairs in or around the home?: (Patient-Rptd) No If so, are there any without handrails?: (Patient-Rptd) No Home free of loose throw rugs in walkways, pet beds, electrical cords, etc?: (Patient-Rptd) Yes Adequate lighting  in your home to reduce risk of falls?: (Patient-Rptd) Yes Life alert?: (Patient-Rptd) No Use of a cane, walker or w/c?: (Patient-Rptd) Yes Grab bars in the bathroom?: (Patient-Rptd) Yes Shower chair or bench in shower?: (Patient-Rptd) Yes Elevated toilet seat or a handicapped toilet?: (Patient-Rptd) Yes  TIMED UP AND GO:  Was the test performed?  No  Cognitive Function: 6CIT completed    03/14/2022    3:27 PM  MMSE - Mini Mental State Exam  Not completed: Unable to complete        04/17/2023    3:36 PM 03/14/2022    3:27 PM  6CIT Screen  What Year? 0 points 0 points  What month? 0 points 0 points  What time? 0 points 0 points  Count back from 20 0 points 0 points  Months in reverse 2 points 2 points  Repeat phrase 0 points 0 points  Total Score 2 points 2 points    Immunizations Immunization History  Administered Date(s) Administered   Fluad Quad(high Dose 65+) 05/27/2020, 06/03/2022   Fluad Trivalent(High Dose 65+) 04/11/2023   Influenza-Unspecified 06/17/2016, 05/25/2021   PFIZER(Purple Top)SARS-COV-2 Vaccination 10/02/2019, 11/20/2019   PNEUMOCOCCAL CONJUGATE-20 09/15/2021   Tdap 10/31/2019   Zoster Recombinant(Shingrix) 06/03/2022    Screening Tests Health Maintenance  Topic Date Due   Mammogram  02/18/2017   DTaP/Tdap/Td (1 - Tdap) 10/31/2019   COVID-19  Vaccine (3 - Pfizer risk series) 12/18/2019   Zoster Vaccines- Shingrix (2 of 2) 07/29/2022   Influenza Vaccine  03/08/2024   Medicare Annual Wellness (AWV)  04/16/2024   FOOT EXAM  04/10/2024   OPHTHALMOLOGY EXAM  07/16/2024   HEMOGLOBIN A1C  07/24/2024   Diabetic kidney evaluation - Urine ACR  08/13/2024   Diabetic kidney evaluation - eGFR measurement  04/19/2025   DEXA SCAN  03/28/2026   Colonoscopy  05/01/2031   Pneumococcal Vaccine: 50+ Years  Completed   Hepatitis C Screening  Completed   HPV VACCINES  Aged Out   Meningococcal B Vaccine  Aged Out    Health Maintenance  Health Maintenance Due  Topic Date Due   Mammogram  02/18/2017   DTaP/Tdap/Td (1 - Tdap) 10/31/2019   COVID-19 Vaccine (3 - Pfizer risk series) 12/18/2019   Zoster Vaccines- Shingrix (2 of 2) 07/29/2022   Influenza Vaccine  03/08/2024   Medicare Annual Wellness (AWV)  04/16/2024   FOOT EXAM  04/10/2024   Health Maintenance Items Addressed: Mammogram ordered   Additional Screening:  Vision Screening: Recommended annual ophthalmology exams for early detection of glaucoma and other disorders of the eye. Would you like a referral to an eye doctor? No    Dental Screening: Recommended annual dental exams for proper oral hygiene  Community Resource Referral / Chronic Care Management: CRR required this visit?  No  Message sent to current case manager regarding concerns about upcoming changes in medication coverages/co-payments and patient not being able to afford all of her medications  CCM required this visit?  No  Plan:   I have personally reviewed and noted the following in the patient's chart:   Medical and social history Use of alcohol, tobacco or illicit drugs  Current medications and supplements including opioid prescriptions. Patient is currently taking opioid prescriptions. Information provided to patient regarding non-opioid alternatives. Patient advised to discuss non-opioid treatment plan  with their provider. Functional ability and status Nutritional status Physical activity Advanced directives List of other physicians Hospitalizations, surgeries, and ER visits in previous 12 months Vitals Screenings to include cognitive,  depression, and falls Referrals and appointments  In addition, I have reviewed and discussed with patient certain preventive protocols, quality metrics, and best practice recommendations. A written personalized care plan for preventive services as well as general preventive health recommendations were provided to patient.   Morrell Fluke, CMA   04/23/2024   After Visit Summary: (MyChart) Due to this being a telephonic visit, the after visit summary with patients personalized plan was offered to patient via MyChart   Notes: Nothing significant to report at this time.

## 2024-04-23 NOTE — Addendum Note (Signed)
 Addended by: ALLYSON PROFFER A on: 04/23/2024 02:31 PM   Modules accepted: Orders

## 2024-04-23 NOTE — Telephone Encounter (Signed)
 Patient is requesting that the appeal be marked urgent so that we can get a response within 72 hrs. Patient states that the sensor she is wearing runs out in 2 days. Please advise.

## 2024-04-23 NOTE — Telephone Encounter (Signed)
 Pharmacy Patient Advocate Encounter  Received notification from [facility] that Prior Authorization for Dexcom G7 Sensor has been DENIED.  Full denial letter will be uploaded to the media tab. See denial reason below.   PA #/Case ID/Reference #: [id number]

## 2024-04-23 NOTE — Patient Instructions (Signed)
 Ms. Wiehe,  Thank you for taking the time for your Medicare Wellness Visit. I appreciate your continued commitment to your health goals. Please review the care plan we discussed, and feel free to reach out if I can assist you further.    Ongoing Care Seeing your primary care provider every 3 to 6 months helps us  monitor your health and provide consistent, personalized care.     Referrals  If a referral was made during today's visit and you haven't received any updates within two weeks, please contact the referred provider directly to check on the status.  To Schedule Your Mammogram, Call:  [facility] @  [phone]   Wishing you good health and many blessings for the year to come  -Dawnyel Leven    Medicare recommends these wellness visits once per year to help you and your care team stay ahead of potential health issues. These visits are designed to focus on prevention, allowing your provider to concentrate on managing your acute and chronic conditions during your regular appointments.     Please note that Annual Wellness Visits do not include a physical exam. Some assessments may be limited, especially if the visit was conducted virtually. If needed, we may recommend a separate in-person follow-up with your provider.  Recommended Screenings:  Health Maintenance  Topic Date Due   Breast Cancer Screening  02/18/2018   DTaP/Tdap/Td vaccine (1 - Tdap) 10/31/2019   COVID-19 Vaccine (3 - Pfizer risk series) 12/18/2019   Zoster (Shingles) Vaccine (2 of 2) 07/29/2022   Flu Shot  03/08/2024   Medicare Annual Wellness Visit  04/16/2024   Complete foot exam   04/10/2024   Eye exam for diabetics  07/16/2024   Hemoglobin A1C  07/24/2024   Yearly kidney health urinalysis for diabetes  08/13/2024   Yearly kidney function blood test for diabetes  04/19/2025   Colon Cancer Screening  05/01/2031   Pneumococcal Vaccine for age over 20  Completed   DEXA scan (bone density measurement)   Completed   Hepatitis C Screening  Completed   HPV Vaccine  Aged Out   Meningitis B Vaccine  Aged Out       04/23/2024   10:43 AM  Advanced Directives  Does Patient Have a Medical Advance Directive? No  Would patient like information on creating a medical advance directive? No - Patient declined    Advance Care Planning is important because it: Ensures you receive medical care that aligns with your values, goals, and preferences. Provides guidance to your family and loved ones, reducing the emotional burden of decision-making during critical moments.    Vision: Annual vision screenings are recommended for early detection of glaucoma, cataracts, and diabetic retinopathy. These exams can also reveal signs of chronic conditions such as diabetes and high blood pressure.    Dental: Annual dental screenings help detect early signs of oral cancer, gum disease, and other conditions linked to overall health, including heart disease and diabetes.  Please see the attached documents for additional preventive care recommendations.

## 2024-04-24 ENCOUNTER — Ambulatory Visit (HOSPITAL_COMMUNITY)

## 2024-04-24 ENCOUNTER — Telehealth: Payer: Self-pay | Admitting: Pharmacist

## 2024-04-24 ENCOUNTER — Encounter (HOSPITAL_COMMUNITY): Payer: Self-pay

## 2024-04-24 NOTE — Telephone Encounter (Signed)
 Appeal has been submitted. The request was made for an expedited review but that is at the discretion of the insurance. Will advise when response is received, please be advised that most companies may take 30 days to make a decision. Appeal letter and supporting information have been faxed to [phone] on 04/24/2024 @9 :05 am.  Thank you, Devere Pandy, PharmD Clinical Pharmacist  [place]  Direct Dial: [phone]

## 2024-04-25 ENCOUNTER — Telehealth: Payer: Self-pay | Admitting: Internal Medicine

## 2024-04-25 ENCOUNTER — Telehealth: Payer: Self-pay

## 2024-04-25 NOTE — Telephone Encounter (Unsigned)
 Copied from CRM [id number]. Topic: Clinical - Medication Refill >> Apr 25, 2024 12:09 PM Zebedee SAUNDERS wrote: Medication: ACCU-CHEK GUIDE test strip,  (ACCU-CHEK SOFTCLIX LANCET DEV) KIT   Has the patient contacted their pharmacy? Yes (Agent: If no, request that the patient contact the pharmacy for the refill. If patient does not wish to contact the pharmacy document the reason why and proceed with request.) (Agent: If yes, when and what did the pharmacy advise?)Pharmacy need script  This is the patient's preferred pharmacy:  [id number] - [place],  - [address] [place] [address] Phone: [phone] Fax: [phone]  Is this the correct pharmacy for this prescription? Yes If no, delete pharmacy and type the correct one.   Has the prescription been filled recently? Yes  Is the patient out of the medication? Yes  Has the patient been seen for an appointment in the last year OR does the patient have an upcoming appointment? Yes  Can we respond through MyChart? Yes  Agent: Please be advised that Rx refills may take up to 3 business days. We ask that you follow-up with your pharmacy.

## 2024-04-25 NOTE — Telephone Encounter (Unsigned)
 Copied from CRM [id number]. Topic: Clinical - Medication Refill >> Apr 25, 2024 12:09 PM Zebedee SAUNDERS wrote: Medication: ACCU-CHEK GUIDE test strip,  (ACCU-CHEK SOFTCLIX LANCET DEV) KIT   Has the patient contacted their pharmacy? Yes (Agent: If no, request that the patient contact the pharmacy for the refill. If patient does not wish to contact the pharmacy document the reason why and proceed with request.) (Agent: If yes, when and what did the pharmacy advise?)Pharmacy need script  This is the patient's preferred pharmacy:  [facility] #[id number] - [place],  - [address] [place] [address] Phone: [phone] Fax: [phone]  Is this the correct pharmacy for this prescription? Yes If no, delete pharmacy and type the correct one.   Has the prescription been filled recently? Yes  Is the patient out of the medication? Yes  Has the patient been seen for an appointment in the last year OR does the patient have an upcoming appointment? Yes  Can we respond through MyChart? Yes  Agent: Please be advised that Rx refills may take up to 3 business days. We ask that you follow-up with your pharmacy. >> Apr 25, 2024 12:17 PM Zebedee SAUNDERS wrote: Pt also need alcohol swabs pads.

## 2024-04-25 NOTE — Telephone Encounter (Signed)
Pt informed

## 2024-04-25 NOTE — Telephone Encounter (Signed)
 Copied from CRM [id number]. Topic: General - Other >> Apr 25, 2024  9:30 AM Turkey A wrote: Reason for CRM: Heart from [facility] called to inform office that patients appeal for Dexcom has been approved until 08/07/2024.

## 2024-04-26 ENCOUNTER — Telehealth: Payer: Self-pay

## 2024-04-26 ENCOUNTER — Other Ambulatory Visit (HOSPITAL_COMMUNITY): Payer: Self-pay

## 2024-04-26 ENCOUNTER — Ambulatory Visit

## 2024-04-26 ENCOUNTER — Other Ambulatory Visit: Payer: Self-pay | Admitting: Internal Medicine

## 2024-04-26 LAB — IGG, IGA, IGM
IgA/Immunoglobulin A, Serum: 139 mg/dL (ref 87–352)
IgG (Immunoglobin G), Serum: 1024 mg/dL (ref 586–1602)
IgM (Immunoglobulin M), Srm: 188 mg/dL (ref 26–217)

## 2024-04-26 LAB — COMPREHENSIVE METABOLIC PANEL WITH GFR
ALT: 88 IU/L — ABNORMAL HIGH (ref 0–32)
AST: 65 IU/L — ABNORMAL HIGH (ref 0–40)
Albumin: 5.1 g/dL — ABNORMAL HIGH (ref 3.9–4.9)
Alkaline Phosphatase: 89 IU/L (ref 44–121)
BUN/Creatinine Ratio: 20 (ref 12–28)
BUN: 20 mg/dL (ref 8–27)
Bilirubin Total: 0.4 mg/dL (ref 0.0–1.2)
CO2: 27 mmol/L (ref 20–29)
Calcium: 9.7 mg/dL (ref 8.7–10.3)
Chloride: 95 mmol/L — ABNORMAL LOW (ref 96–106)
Creatinine, Ser: 1.02 mg/dL — ABNORMAL HIGH (ref 0.57–1.00)
Globulin, Total: 2.5 g/dL (ref 1.5–4.5)
Glucose: 98 mg/dL (ref 70–99)
Potassium: 4 mmol/L (ref 3.5–5.2)
Sodium: 142 mmol/L (ref 134–144)
Total Protein: 7.6 g/dL (ref 6.0–8.5)
eGFR: 59 mL/min/1.73 — ABNORMAL LOW (ref >59)

## 2024-04-26 LAB — HEPATITIS B SURFACE ANTIBODY,QUALITATIVE: Hep B Surface Ab, Qual: NONREACTIVE

## 2024-04-26 LAB — MITOCHONDRIAL/SMOOTH MUSCLE AB PNL
Mitochondrial Ab: 27.3 U — ABNORMAL HIGH (ref 0.0–20.0)
Smooth Muscle Ab: 3 U (ref 0–19)

## 2024-04-26 LAB — HEPATITIS A ANTIBODY, TOTAL: hep A Total Ab: NEGATIVE

## 2024-04-26 LAB — CERULOPLASMIN: Ceruloplasmin: 29.6 mg/dL (ref 19.0–39.0)

## 2024-04-26 LAB — ALPHA-1-ANTITRYPSIN DEFICIENCY

## 2024-04-26 LAB — ANA W/REFLEX IF POSITIVE: Anti Nuclear Antibody (ANA): NEGATIVE

## 2024-04-26 LAB — HEPATITIS B SURFACE ANTIGEN: Hepatitis B Surface Ag: NEGATIVE

## 2024-04-26 NOTE — Telephone Encounter (Signed)
 Copied from CRM #[id number]. Topic: Clinical - Medication Question >> Apr 26, 2024  8:33 AM Charlet HERO wrote: Reason for CRM: Paitent has flu shot scheduled for today and she would like to know if she should pospone getting the shot due to being placed on anitbiotics  Please contact [phone]

## 2024-04-26 NOTE — Progress Notes (Signed)
 Care Guide Pharmacy Note  05/02/2024 Name: Kayla Ryan MRN: [id number] DOB: Nov 27, 1955  Referred By: Tobie Suzzane POUR, MD Reason for referral: Complex Care Management (Outreach to schedule with Pharm d )   Kayla Ryan is a 68 y.o. year old female who is a primary care patient of Tobie Suzzane POUR, MD.  Kayla Ryan was referred to the pharmacist for assistance related to: HLD  Successful contact was made with the patient to discuss pharmacy services including being ready for the pharmacist to call at least 5 minutes before the scheduled appointment time and to have medication bottles and any blood pressure readings ready for review. The patient agreed to meet with the pharmacist via telephone visit on (date/time).05/02/2024  Jeoffrey Buffalo , RMA     [place]  [facility], [facility] Guide  Direct Dial: [phone]  Website: [email or web address]

## 2024-04-26 NOTE — Telephone Encounter (Signed)
 Heart from [facility] called to inform office that patients appeal for Dexcom has been approved until 08/07/2024.

## 2024-04-29 ENCOUNTER — Other Ambulatory Visit: Payer: Self-pay | Admitting: Internal Medicine

## 2024-04-29 DIAGNOSIS — E1149 Type 2 diabetes mellitus with other diabetic neurological complication: Secondary | ICD-10-CM

## 2024-04-29 MED ORDER — ALCOHOL SWABS PADS
MEDICATED_PAD | 3 refills | Status: AC
Start: 2024-04-29 — End: ?

## 2024-04-29 MED ORDER — ACCU-CHEK SOFTCLIX LANCETS MISC
0 refills | Status: DC
Start: 2024-04-29 — End: 2024-05-28

## 2024-04-29 MED ORDER — ACCU-CHEK GUIDE TEST VI STRP: ORAL_STRIP | 12 refills | Status: AC

## 2024-04-29 NOTE — Telephone Encounter (Signed)
Spoke to patient, verbalized understanding.

## 2024-05-01 ENCOUNTER — Telehealth: Payer: Self-pay | Admitting: Emergency Medicine

## 2024-05-01 DIAGNOSIS — R918 Other nonspecific abnormal finding of lung field: Secondary | ICD-10-CM

## 2024-05-01 NOTE — Telephone Encounter (Signed)
 Faxed to [facility]. NFN

## 2024-05-01 NOTE — Telephone Encounter (Signed)
 Copied from CRM #[id number]. Topic: General - Other >> May 01, 2024 12:26 PM Whitney O wrote: Reason for RMF:ejupzwu is calling because I  have a problem dr byrum office has been helping to get a lightweight portable oxygen  inogen  concentrator he send over a prescription for it to [facility] unfortunately he forgot to sign prescription . while i was on the phone with them the prescription needs to be updated and say the inogen portable oxygen  concentrator needs to weight less than 2 pounds up to 3.5 pounds total weight . Please sign prescription so patient can get the portable concentrator  Patient is requesting a call from the office also  [phone]

## 2024-05-01 NOTE — Telephone Encounter (Signed)
 Called and spoke with the patient and she states order needs to be sent to [facility] and not [facility].  Original order was sent to [facility].  I am placing new order.  [facility] can we get this sent over to [facility] asap? Dr. Shelah can you please sign the order.  Thanks!

## 2024-05-01 NOTE — Telephone Encounter (Signed)
 I signed it ---

## 2024-05-02 ENCOUNTER — Other Ambulatory Visit (HOSPITAL_COMMUNITY): Payer: Self-pay | Admitting: Internal Medicine

## 2024-05-02 ENCOUNTER — Other Ambulatory Visit: Payer: Self-pay | Admitting: Internal Medicine

## 2024-05-02 ENCOUNTER — Other Ambulatory Visit (INDEPENDENT_AMBULATORY_CARE_PROVIDER_SITE_OTHER)

## 2024-05-02 DIAGNOSIS — N63 Unspecified lump in unspecified breast: Secondary | ICD-10-CM

## 2024-05-02 DIAGNOSIS — Z599 Problem related to housing and economic circumstances, unspecified: Secondary | ICD-10-CM

## 2024-05-02 DIAGNOSIS — R42 Dizziness and giddiness: Secondary | ICD-10-CM

## 2024-05-02 DIAGNOSIS — Z758 Other problems related to medical facilities and other health care: Secondary | ICD-10-CM

## 2024-05-02 NOTE — Progress Notes (Signed)
 05/02/2024 Name: Kayla Ryan MRN: [id number] DOB: 1956/06/18  Chief Complaint  Patient presents with   Medication Assistance    Kayla Ryan is a 68 y.o. year old female who presented for a telephone visit.   They were referred to the pharmacist by their PCP for assistance in managing medication access.    Subjective:  Care Team: Primary Care Provider: Tobie Suzzane POUR, MD ; Next Scheduled Visit:  Future Appointments  Date Time Provider Department Center  05/06/2024  1:30 PM AP-DG 3 ([place]) AP-DG [place] [facility]  05/21/2024  1:45 PM AP-ACAPA LAB CHCC-APCC None  05/22/2024 11:00 AM Bertrum Rosina HERO, RN CHL-POPH None  05/28/2024 10:30 AM Lamon Pleasant HERO, PA-C CHCC-APCC None  06/11/2024 11:20 AM Tobie Suzzane POUR, MD RPC-RPC [address]  06/13/2024  2:30 PM Shirlean Therisa ORN, NP RGA-RGA RGA  07/15/2024  2:00 PM GI-315 CT 1 GI-315CT GI-[address]  07/18/2024  3:15 PM Shelah Lamar RAMAN, MD LBPU-PULCARE [address]  04/28/2025 10:40 AM RPC-ANNUAL WELLNESS VISIT RPC-RPC [address]     Medication Access/Adherence  Current Pharmacy:  [facility] - [place], [place] - [address]  [place] [address] Phone: [phone] Fax: [phone]   Patient reports affordability concerns with their medications: Yes  Patient reports access/transportation concerns to their pharmacy: none at this time  Patient reports adherence concerns with their medications:  No  stays on top of medication doses without any problem missing or mixing up medications    Medication Management:  Current adherence strategy:   Patient reports Good adherence to medications  Patient reports the following barriers to adherence: none at this time, is thinking about using pill packaging through [facility] out of convenience.   Biggest concerns is possible changes to her prescription plan and loss of benefits.   Objective:  Lab Results   Component Value Date   HGBA1C 5.4 01/23/2024    Lab Results  Component Value Date   CREATININE 1.02 (H) 04/19/2024   BUN 20 04/19/2024   NA 142 04/19/2024   K 4.0 04/19/2024   CL 95 (L) 04/19/2024   CO2 27 04/19/2024    Lab Results  Component Value Date   CHOL 202 (H) 01/23/2024   HDL 35 (L) 01/23/2024   LDLCALC 82 01/23/2024   TRIG 529 (H) 01/23/2024   CHOLHDL 5.8 (H) 01/23/2024    Medications Reviewed Today     Reviewed by Pandora Cadet, [facility] (Pharmacist) on 05/02/24 at [phone]  Med List Status: <None>   Medication Order Taking? Sig Documenting Provider Last Dose Status Informant  Accu-Chek Softclix Lancets lancets [id number]  Use as instructed 3 times daily. Tobie Suzzane POUR, MD  Active   acyclovir  (ZOVIRAX ) 400 MG tablet [id number]  TAKE 1 TABLET(400 MG) BY MOUTH THREE TIMES DAILY  Patient taking differently: 400 mg 2 (two) times daily.   Tobie Suzzane POUR, MD  Active   albuterol  (PROVENTIL ) (2.5 MG/3ML) 0.083% nebulizer solution [id number]  Take 3 mLs (2.5 mg total) by nebulization every 6 (six) hours as needed for wheezing or shortness of breath. Orlie Madelin RAMAN, NP  Active Self  Alcohol  Swabs  PADS [id number]  Use it as directed before blood glucose check 3 times daily. Tobie Suzzane POUR, MD  Active   ASPERCREME LIDOCAINE  EX [id number]  Apply 1 Application topically 3 (three) times daily as needed (pain). [provider]  Active  Self  azithromycin  (ZITHROMAX ) 250 MG tablet [id number]  Take 1 tablet (250 mg total) by mouth daily. Shelah Lamar RAMAN, MD  Active   Blood Pressure Monitoring (BLOOD PRESSURE MONITOR 3) DEVI [id number]  Check blood pressure once daily 1 hour after taking blood pressure medication Tobie Suzzane POUR, MD  Active   cetirizine  (ZYRTEC ) 10 MG tablet [id number]  Take 10 mg by mouth 2 (two) times daily. [provider]  Active Self  Continuous Glucose Receiver (DEXCOM G7 RECEIVER) ESPIRIDION [id number]  USE TO CHECK BLOOD GLUCOSE AS NEEDED Patel, Rutwik K, MD   Active Self  Continuous Glucose Sensor (DEXCOM G7 SENSOR) MISC [id number]  USE TO CHECK BLOOD SUGAR THREE TIMES DAILY, BEFORE MEALS AND AT BEDTIME AND AS NEEDED. CHANGE SENSOR EVERY 10 DAYS Tobie Suzzane POUR, MD  Active   dicyclomine  (BENTYL ) 10 MG capsule [id number]  Take 1 capsule (10 mg total) by mouth 4 (four) times daily -  before meals and at bedtime. Monitor for constipation, dry mouth, dizziness  Patient not taking: Reported on 04/23/2024   Shirlean Therisa ORN, NP  Active   diphenhydrAMINE  (BENADRYL ) 25 MG tablet [id number]  Take 50 mg by mouth daily as needed for allergies or itching. [provider]  Active Self  doxepin  (SINEQUAN ) 10 MG capsule [id number]  TAKE 1 CAPSULE(10 MG) BY MOUTH AT BEDTIME Tobie Suzzane POUR, MD  Active   DULoxetine  (CYMBALTA ) 60 MG capsule [id number]  TAKE 1 CAPSULE(60 MG) BY MOUTH TWICE DAILY Tobie Suzzane POUR, MD  Active   ezetimibe  (ZETIA ) 10 MG tablet [id number]  Take 1 tablet (10 mg total) by mouth daily. Debera Jayson MATSU, MD  Active   gabapentin  (NEURONTIN ) 800 MG tablet [id number]  Take 1 tablet (800 mg total) by mouth 3 (three) times daily. TAKE 1 TABLET(800 MG) BY MOUTH THREE TIMES DAILY Patel, Rutwik K, MD  Active   Glucagon  (GVOKE HYPOPEN  2-PACK) 1 MG/0.2ML SOAJ [id number]  Inject 0.2 mLs into the skin as needed (Blood glucose < 53). Tobie Suzzane POUR, MD  Active Self  glucose blood (ACCU-CHEK GUIDE TEST) test strip [id number]  Use as instructed 3 times daily. Tobie Suzzane POUR, MD  Active   Glycerin-Hypromellose-PEG 400 (DRY EYE RELIEF DROPS OP) [id number]  Place 1 drop into both eyes 3 (three) times daily as needed (Dry eye). [provider]  Active Self  HYDROcodone -acetaminophen  (NORCO) 10-325 MG tablet [id number]  Take 1 tablet by mouth every 4 (four) hours as needed. Stop Hydrocodone  7.5/325mg  tablet.   Active Self  ipratropium (ATROVENT  HFA) 17 MCG/ACT inhaler [id number]  Inhale 2 puffs into the lungs every 4 (four) hours as needed (COPD). Byrum, Robert S,  MD  Active Self  isosorbide  dinitrate (ISORDIL ) 30 MG tablet [id number]  Take 1 tablet (30 mg total) by mouth daily. Tobie Suzzane POUR, MD  Active   JARDIANCE  10 MG TABS tablet [id number]  TAKE 1 TABLET(10 MG) BY MOUTH DAILY BEFORE BREAKFAST Patel, Rutwik K, MD  Active   Lancets Misc. (ACCU-CHEK SOFTCLIX LANCET DEV) KIT [id number]  USE TO CHECK GLUCOSE THREE TIMES DAILY Tobie Suzzane POUR, MD  Active Self  LORazepam  (ATIVAN ) 0.5 MG tablet [id number]  TAKE 1 TABLET(0.5 MG) BY MOUTH THREE TIMES DAILY AS NEEDED FOR ANXIETY Tobie Suzzane POUR, MD  Active   Magnesium  Hydroxide (MILK OF MAGNESIA PO) [id number]  Take by mouth. Takes as needed [provider]  Active   meclizine  (ANTIVERT ) 25 MG tablet [id number]  TAKE 1 TABLET(25 MG) BY MOUTH THREE TIMES DAILY  AS NEEDED FOR DIZZINESS Patel, Rutwik K, MD  Active   methocarbamol  (ROBAXIN ) 500 MG tablet [id number]  TAKE 1 TABLET(500 MG) BY MOUTH EVERY 8 HOURS AS NEEDED FOR MUSCLE SPASMS Tobie Suzzane POUR, MD  Active   Misc. Devices KIT [id number]  Henmnii rollator walker Tobie Suzzane POUR, MD  Active Self  montelukast  (SINGULAIR ) 10 MG tablet [id number]  TAKE 1 TABLET(10 MG) BY MOUTH AT BEDTIME Shelah Lamar RAMAN, MD  Active   MOUNJARO  5 MG/0.5ML Pen [id number]  ADMINISTER 5 MG UNDER THE SKIN 1 TIME A WEEK Tobie, Suzzane POUR, MD  Active   nitroGLYCERIN  (NITROSTAT ) 0.4 MG SL tablet [id number]  Place 1 tablet (0.4 mg total) under the tongue every 5 (five) minutes as needed for chest pain. Debera Jayson MATSU, MD  Expired 04/23/24 2359 Self  omeprazole  (PRILOSEC) 40 MG capsule [id number]  Take 1 capsule (40 mg total) by mouth 2 (two) times daily. Tobie Suzzane POUR, MD  Active   ondansetron  (ZOFRAN -ODT) 8 MG disintegrating tablet [id number]  DISSOLVE 1 TABLET EVERY 8 HOURS AS NEEDED FOR FOR NAUSEA AND VOMITING Rogers Hai, MD  Active   STIOLTO RESPIMAT  2.5-2.5 MCG/ACT AERS [id number]  INHALE 2 PUFFS INTO THE LUNGS DAILY Patel, Rutwik K, MD  Active Self  Vitamin D , Ergocalciferol ,  (DRISDOL ) 1.25 MG (50000 UNIT) CAPS capsule [id number]  Take 1 capsule (50,000 Units total) by mouth every 7 (seven) days. Tobie Suzzane POUR, MD  Active   XIFAXAN  550 MG TABS tablet [id number]  TAKE 1 TABLET(550 MG) BY MOUTH THREE TIMES DAILY FOR 14 DAYS  Patient not taking: Reported on 04/23/2024   Shirlean Therisa ORN, NP  Active            Assessment/Plan:   Medication Management: - Currently strategy sufficient to maintain appropriate adherence to prescribed medication regimen - Suggested use of weekly pill box to organize medications - Created list of medication, indication, and administration time. Provided to patient - Discussed collaboration with local pharmacies for adherence packaging. Reviewed local pharmacies with adherence packaging options. Patient elects to continue with - reviewed resources to assist in plan selection - open enrollment starts October 15th 2025 - did not identify any changes in benefit eligibility that would exclude her from current financial programs. For non-medicaid medicare plans, the out of pocket cap is set to increase to $2100 from 2000, and Rx deductible can increase as much as $25.  - counseled on Seniors? Health Insurance Information Program [facility]). Contact Medicare Counselor / set up an appointment by calling [phone]. Hours are Monday through Friday from 8am to 5pm.  [facility] Insurance Help:  [phone]. 8 a.m.-8 p.m. local time, 7 days a week     Follow Up Plan: no changes at this time, provided my number should patient have any questions.   Lang Sieve, PharmD, BCGP Clinical Pharmacist  [phone]

## 2024-05-06 ENCOUNTER — Other Ambulatory Visit: Payer: Self-pay | Admitting: *Deleted

## 2024-05-06 ENCOUNTER — Other Ambulatory Visit: Payer: Self-pay | Admitting: Internal Medicine

## 2024-05-06 ENCOUNTER — Other Ambulatory Visit (HOSPITAL_COMMUNITY)

## 2024-05-06 DIAGNOSIS — K219 Gastro-esophageal reflux disease without esophagitis: Secondary | ICD-10-CM

## 2024-05-06 DIAGNOSIS — M5030 Other cervical disc degeneration, unspecified cervical region: Secondary | ICD-10-CM

## 2024-05-06 MED ORDER — OMEPRAZOLE 40 MG PO CPDR
40.0000 mg | DELAYED_RELEASE_CAPSULE | Freq: Two times a day (BID) | ORAL | 3 refills | Status: AC
Start: 2024-05-06 — End: ?

## 2024-05-06 MED ORDER — METHOCARBAMOL 500 MG PO TABS: 500.0000 mg | ORAL_TABLET | Freq: Three times a day (TID) | ORAL | 2 refills | Status: DC | PRN

## 2024-05-06 NOTE — Telephone Encounter (Signed)
 Copied from CRM [id number]. Topic: Clinical - Medication Refill >> May 06, 2024  3:29 PM Sophia H wrote: Medication: methocarbamol  (ROBAXIN ) 500 MG tablet   Has the patient contacted their pharmacy? Yes, patient states she was told that they have faxed twice with no response.  This is the patient's preferred pharmacy:  [facility] #[id number] - [place], [place] - [address] [place] [address] Phone: [phone] Fax: [phone]  Is this the correct pharmacy for this prescription? Yes If no, delete pharmacy and type the correct one.   Has the prescription been filled recently? Yes  Is the patient out of the medication? Yes, patient requesting fill asap.   Has the patient been seen for an appointment in the last year OR does the patient have an upcoming appointment? Yes  Can we respond through MyChart? Yes  Agent: Please be advised that Rx refills may take up to 3 business days. We ask that you follow-up with your pharmacy.

## 2024-05-08 ENCOUNTER — Ambulatory Visit (HOSPITAL_COMMUNITY)

## 2024-05-09 ENCOUNTER — Ambulatory Visit (HOSPITAL_COMMUNITY)

## 2024-05-09 ENCOUNTER — Encounter (HOSPITAL_COMMUNITY)

## 2024-05-13 DIAGNOSIS — G894 Chronic pain syndrome: Secondary | ICD-10-CM | POA: Diagnosis not present

## 2024-05-13 DIAGNOSIS — M797 Fibromyalgia: Secondary | ICD-10-CM | POA: Diagnosis not present

## 2024-05-13 DIAGNOSIS — M545 Low back pain, unspecified: Secondary | ICD-10-CM | POA: Diagnosis not present

## 2024-05-13 DIAGNOSIS — M47816 Spondylosis without myelopathy or radiculopathy, lumbar region: Secondary | ICD-10-CM | POA: Diagnosis not present

## 2024-05-15 NOTE — Telephone Encounter (Signed)
 Please arrange MRI/MRCP due to elevated alk phos, elevated transaminases, hepatic steatosis and splenomegaly. Ideally before November visit with me.

## 2024-05-15 NOTE — Addendum Note (Signed)
 Addended by: JEANELL GRAEME RAMAN on: 05/15/2024 11:27 AM   Modules accepted: Orders

## 2024-05-17 ENCOUNTER — Ambulatory Visit

## 2024-05-20 ENCOUNTER — Ambulatory Visit (HOSPITAL_COMMUNITY)
Admission: RE | Admit: 2024-05-20 | Discharge: 2024-05-20 | Disposition: A | Discharge status: Home / Self Care | Source: Ambulatory Visit | Attending: Gastroenterology | Admitting: Gastroenterology

## 2024-05-20 ENCOUNTER — Ambulatory Visit (INDEPENDENT_AMBULATORY_CARE_PROVIDER_SITE_OTHER)

## 2024-05-20 DIAGNOSIS — K224 Dyskinesia of esophagus: Secondary | ICD-10-CM | POA: Diagnosis not present

## 2024-05-20 DIAGNOSIS — Z23 Encounter for immunization: Secondary | ICD-10-CM | POA: Diagnosis not present

## 2024-05-20 DIAGNOSIS — R1312 Dysphagia, oropharyngeal phase: Secondary | ICD-10-CM | POA: Diagnosis not present

## 2024-05-20 NOTE — Progress Notes (Signed)
 Patient is in office today for a nurse visit for Immunization. Patient Injection was given in the  Right deltoid. Patient tolerated injection well.

## 2024-05-21 ENCOUNTER — Telehealth: Payer: Self-pay

## 2024-05-21 ENCOUNTER — Inpatient Hospital Stay: Attending: Hematology

## 2024-05-21 DIAGNOSIS — D509 Iron deficiency anemia, unspecified: Secondary | ICD-10-CM | POA: Diagnosis present

## 2024-05-21 DIAGNOSIS — Z87891 Personal history of nicotine dependence: Secondary | ICD-10-CM | POA: Insufficient documentation

## 2024-05-21 DIAGNOSIS — D500 Iron deficiency anemia secondary to blood loss (chronic): Secondary | ICD-10-CM

## 2024-05-21 DIAGNOSIS — Z801 Family history of malignant neoplasm of trachea, bronchus and lung: Secondary | ICD-10-CM | POA: Diagnosis not present

## 2024-05-21 LAB — CBC WITH DIFFERENTIAL/PLATELET
Abs Immature Granulocytes: 0.01 K/uL (ref 0.00–0.07)
Basophils Absolute: 0.1 K/uL (ref 0.0–0.1)
Basophils Relative: 1 %
Eosinophils Absolute: 0.2 K/uL (ref 0.0–0.5)
Eosinophils Relative: 2 %
HCT: 43.3 % (ref 36.0–46.0)
Hemoglobin: 13.7 g/dL (ref 12.0–15.0)
Immature Granulocytes: 0 %
Lymphocytes Relative: 28 %
Lymphs Abs: 1.9 K/uL (ref 0.7–4.0)
MCH: 29.8 pg (ref 26.0–34.0)
MCHC: 31.6 g/dL (ref 30.0–36.0)
MCV: 94.1 fL (ref 80.0–100.0)
Monocytes Absolute: 0.5 K/uL (ref 0.1–1.0)
Monocytes Relative: 7 %
Neutro Abs: 4.1 K/uL (ref 1.7–7.7)
Neutrophils Relative %: 62 %
Platelets: 360 K/uL (ref 150–400)
RBC: 4.6 MIL/uL (ref 3.87–5.11)
RDW: 13 % (ref 11.5–15.5)
WBC: 6.6 K/uL (ref 4.0–10.5)
nRBC: 0 % (ref 0.0–0.2)

## 2024-05-21 LAB — IRON AND TIBC
Iron: 39 ug/dL (ref 28–170)
Saturation Ratios: 11 % (ref 10.4–31.8)
TIBC: 346 ug/dL (ref 250–450)
UIBC: 306 ug/dL

## 2024-05-21 LAB — FERRITIN: Ferritin: 116 ng/mL (ref 11–307)

## 2024-05-21 NOTE — Progress Notes (Signed)
   05/21/2024  Patient ID: Kayla Ryan Ona, female   DOB: 12/11/1955, 68 y.o.   MRN: [id number]  Received VM from patient requesting call back. Attempted to call back but had to leave VM.  Patient already has my direct line for call back [phone]  Lang Sieve, PharmD, [facility] Clinical Pharmacist  [phone]

## 2024-05-21 NOTE — Progress Notes (Signed)
   05/21/2024  Patient ID: Kayla Ryan, female   DOB: [birth year], 68 y.o.   MRN: [id number]  Spoke with patient regarding insurance questions. Currently holds dual eligible [facility] plan, had concern over whether the OTC and grocery benefit would continue - patient plants to call insurance to see what would happen should she continue current plant.   Urged for patient to contact [facility] to have comprehensive review of medical and prescription related needs and ensure she is set up for best plan for her this coming year.   Encouraged her to reach out to me if she has not found clarity in next steps.  Lang Sieve, PharmD, BCGP Clinical Pharmacist  [phone]

## 2024-05-22 ENCOUNTER — Other Ambulatory Visit: Payer: Self-pay | Admitting: *Deleted

## 2024-05-22 DIAGNOSIS — J449 Chronic obstructive pulmonary disease, unspecified: Secondary | ICD-10-CM | POA: Diagnosis not present

## 2024-05-22 DIAGNOSIS — J9611 Chronic respiratory failure with hypoxia: Secondary | ICD-10-CM | POA: Diagnosis not present

## 2024-05-22 NOTE — Patient Instructions (Signed)
 Visit Information  Thank you for taking time to visit with me today. Please don't hesitate to contact me if I can be of assistance to you before our next scheduled appointment.  Your next care management appointment is by telephone on 06-24-2024 at 11:00 am  Telephone follow-up in 1 month  Please call the care guide team at [phone] if you need to cancel, schedule, or reschedule an appointment.   Please call the Suicide and Crisis Lifeline: 988 call the [place]  National Suicide Prevention Lifeline: (628)759-1201 or TTY: 615-242-4927 TTY 219-529-6518) to talk to a trained counselor call 1-800-273-TALK (toll free, 24 hour hotline) if you are experiencing a Mental Health or Behavioral Health Crisis or need someone to talk to.  Rosina Forte, BSN RN [facility], [facility] RN Care Manager Direct Dial: [phone]  Fax: [phone]

## 2024-05-22 NOTE — Patient Outreach (Signed)
 Complex Care Management   Visit Note  05/22/2024  Name:  Kayla Ryan MRN: [id number] DOB: 1955-12-04  Situation: Referral received for Complex Care Management related to COPD and HLD I obtained verbal consent from Patient.  Visit completed with Patient  on the phone  Patient reports dark stools for the past 10 days, increased fatigue, and approximately three episodes of morning diarrhea daily. An in-basket message has been sent to Therisa Stager, NP with GI for further guidance. Patient states she is compliant with all prescribed medications. Recent lab work from yesterday is stable and within normal limits.  Background:   Past Medical History:  Diagnosis Date   Allergy Unknown   Anemia    Anxiety 1883   Arthritis    Asthma 1980   CHF (congestive heart failure) (HCC) 2021   Chronic bronchitis    Crohn disease (HCC)    Emphysema    Emphysema of lung (HCC) 2003   Fibromyalgia    GERD (gastroesophageal reflux disease)    GI bleed    Herpes    History of blood transfusion    Hyperlipidemia    Hypertension 2015   IBS (irritable bowel syndrome)    Kidney failure    Migraines    Muscular dystrophy (HCC)    Neck pain    Oxygen  deficiency 2021   PAF (paroxysmal atrial fibrillation) (HCC)    Not anticoagulated with history of severe GI bleeding   Plantar fasciitis    Polymyositis (HCC)    PONV (postoperative nausea and vomiting)    Right thyroid  nodule 04/07/2022   Scoliosis    Type 2 diabetes mellitus (HCC)     Assessment: Patient Reported Symptoms:  Cognitive Cognitive Status: No symptoms reported Cognitive/Intellectual Conditions Management [RPT]: None reported or documented in medical history or problem list   Health Maintenance Behaviors: Annual physical exam Healing Pattern: Average Health Facilitated by: Rest  Neurological Neurological Review of Symptoms: Weakness Neurological Self-Management Outcome: 3 (uncertain) Neurological Comment: generalized weakness   HEENT HEENT Symptoms Reported: Tearing, Nasal discharge HEENT Management Strategies: Routine screening HEENT Self-Management Outcome: 4 (good)    Cardiovascular Cardiovascular Symptoms Reported: Fatigue, Lightheadness Does patient have uncontrolled Hypertension?: No Cardiovascular Management Strategies: Routine screening Cardiovascular Self-Management Outcome: 4 (good)  Respiratory Respiratory Symptoms Reported: Shortness of breath Respiratory Management Strategies: Routine screening Respiratory Self-Management Outcome: 4 (good)  Endocrine Endocrine Symptoms Reported: No symptoms reported Is patient diabetic?: Yes Is patient checking blood sugars at home?: Yes Endocrine Self-Management Outcome: 4 (good)  Gastrointestinal Gastrointestinal Symptoms Reported: Bleeding, Diarrhea Additional Gastrointestinal Details: She reports having dark stools for the past 10 days, increased fatigue, and approximately three episodes of morning diarrhea daily. Gastrointestinal Self-Management Outcome: 3 (uncertain) Gastrointestinal Comment: inbasket message sent to GI    Genitourinary Genitourinary Symptoms Reported: No symptoms reported    Integumentary Integumentary Symptoms Reported: No symptoms reported Skin Self-Management Outcome: 4 (good)  Musculoskeletal Musculoskelatal Symptoms Reviewed: Back pain Musculoskeletal Management Strategies: Routine screening, Medication therapy, Coping strategies Musculoskeletal Self-Management Outcome: 3 (uncertain) Number of falls in past year: 2 or more Was there an injury with Fall?: Yes Patient at Risk for Falls Due to: History of fall(s), Impaired balance/gait, Impaired mobility Fall risk Follow up: Falls evaluation completed, Education provided  Psychosocial Psychosocial Symptoms Reported: No symptoms reported Behavioral Management Strategies: Support system Behavioral Health Self-Management Outcome: 4 (good) Major Change/Loss/Stressor/Fears (CP):  Denies Techniques to Cope with Loss/Stress/Change: Not applicable      05/22/2024    PHQ2-9 Depression Screening  Little interest or pleasure in doing things Not at all  Feeling down, depressed, or hopeless Not at all  PHQ-2 - Total Score 0  Trouble falling or staying asleep, or sleeping too much    Feeling tired or having little energy    Poor appetite or overeating     Feeling bad about yourself - or that you are a failure or have let yourself or your family down    Trouble concentrating on things, such as reading the newspaper or watching television    Moving or speaking so slowly that other people could have noticed.  Or the opposite - being so fidgety or restless that you have been moving around a lot more than usual    Thoughts that you would be better off dead, or hurting yourself in some way    PHQ2-9 Total Score    If you checked off any problems, how difficult have these problems made it for you to do your work, take care of things at home, or get along with other people    Depression Interventions/Treatment      There were no vitals filed for this visit.  Medications Reviewed Today     Reviewed by Bertrum Rosina HERO, RN (Registered Nurse) on 05/22/24 at 1108  Med List Status: <None>   Medication Order Taking? Sig Documenting Provider Last Dose Status Informant  Accu-Chek Softclix Lancets lancets [id number] Yes Use as instructed 3 times daily. Tobie Suzzane POUR, MD  Active   acyclovir  (ZOVIRAX ) 400 MG tablet [id number]  TAKE 1 TABLET(400 MG) BY MOUTH THREE TIMES DAILY  Patient not taking: Reported on 05/22/2024   Tobie Suzzane POUR, MD  Active   albuterol  (PROVENTIL ) (2.5 MG/3ML) 0.083% nebulizer solution [id number] Yes Take 3 mLs (2.5 mg total) by nebulization every 6 (six) hours as needed for wheezing or shortness of breath. Orlie Madelin RAMAN, NP  Active Self  Alcohol  Swabs  PADS [id number] Yes Use it as directed before blood glucose check 3 times daily. Tobie Suzzane POUR, MD  Active    ASPERCREME LIDOCAINE  EX [id number] Yes Apply 1 Application topically 3 (three) times daily as needed (pain). [provider]  Active Self  azithromycin  (ZITHROMAX ) 250 MG tablet [id number]  Take 1 tablet (250 mg total) by mouth daily.  Patient not taking: Reported on 05/22/2024   Shelah Lamar RAMAN, MD  Active   Blood Pressure Monitoring (BLOOD PRESSURE MONITOR 3) DEVI [id number] Yes Check blood pressure once daily 1 hour after taking blood pressure medication Tobie Suzzane POUR, MD  Active   cetirizine  (ZYRTEC ) 10 MG tablet [id number] Yes Take 10 mg by mouth 2 (two) times daily. [provider]  Active Self  Continuous Glucose Receiver (DEXCOM G7 RECEIVER) ESPIRIDION [id number] Yes USE TO CHECK BLOOD GLUCOSE AS NEEDED Tobie Suzzane POUR, MD  Active Self  Continuous Glucose Sensor (DEXCOM G7 SENSOR) MISC [id number] Yes USE TO CHECK BLOOD SUGAR THREE TIMES DAILY, BEFORE MEALS AND AT BEDTIME AND AS NEEDED. CHANGE SENSOR EVERY 10 DAYS Tobie Suzzane POUR, MD  Active   dicyclomine  (BENTYL ) 10 MG capsule [id number] Yes Take 1 capsule (10 mg total) by mouth 4 (four) times daily -  before meals and at bedtime. Monitor for constipation, dry mouth, dizziness Shirlean Therisa ORN, NP  Active   diphenhydrAMINE  (BENADRYL ) 25 MG tablet [id number] Yes Take 50 mg by mouth daily as needed for allergies or itching. [provider]  Active Self  doxepin  (SINEQUAN ) 10 MG capsule [id number] Yes TAKE 1 CAPSULE(10 MG)  BY MOUTH AT BEDTIME Tobie Suzzane POUR, MD  Active   DULoxetine  (CYMBALTA ) 60 MG capsule [id number] Yes TAKE 1 CAPSULE(60 MG) BY MOUTH TWICE DAILY Patel, Rutwik K, MD  Active   ezetimibe  (ZETIA ) 10 MG tablet [id number] Yes Take 1 tablet (10 mg total) by mouth daily. Debera Jayson MATSU, MD  Active   gabapentin  (NEURONTIN ) 800 MG tablet [id number] Yes Take 1 tablet (800 mg total) by mouth 3 (three) times daily. TAKE 1 TABLET(800 MG) BY MOUTH THREE TIMES DAILY Patel, Rutwik K, MD  Active   Glucagon  (GVOKE HYPOPEN  2-PACK) 1  MG/0.2ML SOAJ [id number] Yes Inject 0.2 mLs into the skin as needed (Blood glucose < 53). Tobie Suzzane POUR, MD  Active Self  glucose blood (ACCU-CHEK GUIDE TEST) test strip [id number] Yes Use as instructed 3 times daily. Tobie Suzzane POUR, MD  Active   Glycerin-Hypromellose-PEG 400 (DRY EYE RELIEF DROPS OP) [id number] Yes Place 1 drop into both eyes 3 (three) times daily as needed (Dry eye). [provider]  Active Self  HYDROcodone -acetaminophen  (NORCO) 10-325 MG tablet [id number] Yes Take 1 tablet by mouth every 4 (four) hours as needed. Stop Hydrocodone  7.5/325mg  tablet.   Active Self  ipratropium (ATROVENT  HFA) 17 MCG/ACT inhaler [id number] Yes Inhale 2 puffs into the lungs every 4 (four) hours as needed (COPD). Byrum, Robert S, MD  Active Self  isosorbide  dinitrate (ISORDIL ) 30 MG tablet [id number] Yes Take 1 tablet (30 mg total) by mouth daily. Tobie Suzzane POUR, MD  Active   JARDIANCE  10 MG TABS tablet [id number] Yes TAKE 1 TABLET(10 MG) BY MOUTH DAILY BEFORE BREAKFAST Patel, Rutwik K, MD  Active   Lancets Misc. (ACCU-CHEK SOFTCLIX LANCET DEV) KIT [id number] Yes USE TO CHECK GLUCOSE THREE TIMES DAILY Tobie Suzzane POUR, MD  Active Self  LORazepam  (ATIVAN ) 0.5 MG tablet [id number] Yes TAKE 1 TABLET(0.5 MG) BY MOUTH THREE TIMES DAILY AS NEEDED FOR ANXIETY Patel, Rutwik K, MD  Active   Magnesium  Hydroxide (MILK OF MAGNESIA PO) [id number] Yes Take by mouth. Takes as needed [provider]  Active   meclizine  (ANTIVERT ) 25 MG tablet [id number] Yes TAKE 1 TABLET(25 MG) BY MOUTH THREE TIMES DAILY AS NEEDED FOR DIZZINESS Patel, Rutwik K, MD  Active   methocarbamol  (ROBAXIN ) 500 MG tablet [id number] Yes Take 1 tablet (500 mg total) by mouth every 8 (eight) hours as needed for muscle spasms. Tobie Suzzane POUR, MD  Active   Misc. Devices KIT [id number] Yes Henmnii rollator walker Tobie Suzzane POUR, MD  Active Self  montelukast  (SINGULAIR ) 10 MG tablet [id number] Yes TAKE 1 TABLET(10 MG) BY MOUTH AT BEDTIME Shelah Lamar RAMAN, MD  Active   MOUNJARO  5 MG/0.5ML Pen [id number] Yes ADMINISTER 5 MG UNDER THE SKIN 1 TIME A WEEK Tobie, Suzzane POUR, MD  Active   nitroGLYCERIN  (NITROSTAT ) 0.4 MG SL tablet [id number] Yes Place 1 tablet (0.4 mg total) under the tongue every 5 (five) minutes as needed for chest pain. Debera Jayson MATSU, MD  Active Self  omeprazole  (PRILOSEC) 40 MG capsule [id number] Yes Take 1 capsule (40 mg total) by mouth 2 (two) times daily. Lamon Herter M, PA-C  Active   ondansetron  (ZOFRAN -ODT) 8 MG disintegrating tablet [id number] Yes DISSOLVE 1 TABLET EVERY 8 HOURS AS NEEDED FOR FOR NAUSEA AND VOMITING Rogers Hai, MD  Active   STIOLTO RESPIMAT  2.5-2.5 MCG/ACT AERS [id number] Yes INHALE 2 PUFFS INTO THE LUNGS DAILY Tobie Suzzane POUR, MD  Active Self  Vitamin D , Ergocalciferol , (DRISDOL ) 1.25 MG (50000 UNIT) CAPS  capsule [id number] Yes Take 1 capsule (50,000 Units total) by mouth every 7 (seven) days. Tobie Suzzane POUR, MD  Active   XIFAXAN  550 MG TABS tablet [id number]  TAKE 1 TABLET(550 MG) BY MOUTH THREE TIMES DAILY FOR 14 DAYS  Patient not taking: Reported on 05/22/2024   Shirlean Therisa ORN, NP  Active             Recommendation:   Specialty provider follow-up Possible GI bleed Continue Current Plan of Care  Follow Up Plan:   Telephone follow-up in 1 month  Rosina Forte, BSN RN [facility], [facility] RN Care Manager Direct Dial: [phone]  Fax: [phone]

## 2024-05-23 ENCOUNTER — Encounter (HOSPITAL_COMMUNITY): Payer: Self-pay

## 2024-05-23 ENCOUNTER — Other Ambulatory Visit (HOSPITAL_COMMUNITY): Payer: Self-pay | Admitting: Internal Medicine

## 2024-05-23 ENCOUNTER — Ambulatory Visit (HOSPITAL_COMMUNITY)
Admission: RE | Admit: 2024-05-23 | Discharge: 2024-05-23 | Disposition: A | Discharge status: Home / Self Care | Source: Ambulatory Visit | Attending: Internal Medicine | Admitting: Internal Medicine

## 2024-05-23 DIAGNOSIS — N63 Unspecified lump in unspecified breast: Secondary | ICD-10-CM | POA: Insufficient documentation

## 2024-05-23 DIAGNOSIS — R928 Other abnormal and inconclusive findings on diagnostic imaging of breast: Secondary | ICD-10-CM

## 2024-05-23 DIAGNOSIS — N6314 Unspecified lump in the right breast, lower inner quadrant: Secondary | ICD-10-CM | POA: Diagnosis not present

## 2024-05-23 DIAGNOSIS — N6342 Unspecified lump in left breast, subareolar: Secondary | ICD-10-CM | POA: Diagnosis not present

## 2024-05-23 DIAGNOSIS — R921 Mammographic calcification found on diagnostic imaging of breast: Secondary | ICD-10-CM | POA: Diagnosis not present

## 2024-05-23 DIAGNOSIS — N6325 Unspecified lump in the left breast, overlapping quadrants: Secondary | ICD-10-CM | POA: Diagnosis not present

## 2024-05-24 ENCOUNTER — Emergency Department (HOSPITAL_COMMUNITY)

## 2024-05-24 ENCOUNTER — Emergency Department (HOSPITAL_COMMUNITY)
Admission: EM | Admit: 2024-05-24 | Discharge: 2024-05-24 | Disposition: A | Discharge status: Home / Self Care | Attending: Emergency Medicine | Admitting: Emergency Medicine

## 2024-05-24 ENCOUNTER — Other Ambulatory Visit: Payer: Self-pay

## 2024-05-24 ENCOUNTER — Ambulatory Visit (HOSPITAL_COMMUNITY): Admission: RE | Admit: 2024-05-24 | Source: Ambulatory Visit

## 2024-05-24 ENCOUNTER — Encounter (HOSPITAL_COMMUNITY): Payer: Self-pay | Admitting: Emergency Medicine

## 2024-05-24 ENCOUNTER — Encounter (HOSPITAL_COMMUNITY): Payer: Self-pay

## 2024-05-24 ENCOUNTER — Other Ambulatory Visit: Payer: Self-pay | Admitting: Gastroenterology

## 2024-05-24 DIAGNOSIS — S8002XA Contusion of left knee, initial encounter: Secondary | ICD-10-CM | POA: Diagnosis not present

## 2024-05-24 DIAGNOSIS — E041 Nontoxic single thyroid nodule: Secondary | ICD-10-CM | POA: Diagnosis not present

## 2024-05-24 DIAGNOSIS — R7989 Other specified abnormal findings of blood chemistry: Secondary | ICD-10-CM

## 2024-05-24 DIAGNOSIS — M542 Cervicalgia: Secondary | ICD-10-CM | POA: Diagnosis not present

## 2024-05-24 DIAGNOSIS — R9431 Abnormal electrocardiogram [ECG] [EKG]: Secondary | ICD-10-CM | POA: Diagnosis not present

## 2024-05-24 DIAGNOSIS — M25562 Pain in left knee: Secondary | ICD-10-CM | POA: Diagnosis not present

## 2024-05-24 DIAGNOSIS — R519 Headache, unspecified: Secondary | ICD-10-CM | POA: Insufficient documentation

## 2024-05-24 DIAGNOSIS — R270 Ataxia, unspecified: Secondary | ICD-10-CM | POA: Diagnosis not present

## 2024-05-24 DIAGNOSIS — M47814 Spondylosis without myelopathy or radiculopathy, thoracic region: Secondary | ICD-10-CM | POA: Diagnosis not present

## 2024-05-24 DIAGNOSIS — M25561 Pain in right knee: Secondary | ICD-10-CM | POA: Insufficient documentation

## 2024-05-24 DIAGNOSIS — W19XXXA Unspecified fall, initial encounter: Secondary | ICD-10-CM

## 2024-05-24 DIAGNOSIS — R220 Localized swelling, mass and lump, head: Secondary | ICD-10-CM | POA: Insufficient documentation

## 2024-05-24 DIAGNOSIS — S0990XA Unspecified injury of head, initial encounter: Secondary | ICD-10-CM | POA: Diagnosis not present

## 2024-05-24 DIAGNOSIS — K760 Fatty (change of) liver, not elsewhere classified: Secondary | ICD-10-CM

## 2024-05-24 DIAGNOSIS — W1830XA Fall on same level, unspecified, initial encounter: Secondary | ICD-10-CM | POA: Insufficient documentation

## 2024-05-24 DIAGNOSIS — R42 Dizziness and giddiness: Secondary | ICD-10-CM | POA: Diagnosis not present

## 2024-05-24 DIAGNOSIS — S8001XA Contusion of right knee, initial encounter: Secondary | ICD-10-CM | POA: Diagnosis not present

## 2024-05-24 DIAGNOSIS — S8000XA Contusion of unspecified knee, initial encounter: Secondary | ICD-10-CM

## 2024-05-24 LAB — URINALYSIS, ROUTINE W REFLEX MICROSCOPIC
Bacteria, UA: NONE SEEN
Bilirubin Urine: NEGATIVE
Glucose, UA: >=500 mg/dL — AB
Hgb urine dipstick: NEGATIVE
Ketones, ur: NEGATIVE mg/dL
Leukocytes,Ua: NEGATIVE
Nitrite: NEGATIVE
Protein, ur: NEGATIVE mg/dL
Specific Gravity, Urine: 1.005 (ref 1.005–1.030)
pH: 7 (ref 5.0–8.0)

## 2024-05-24 LAB — COMPREHENSIVE METABOLIC PANEL WITH GFR
ALT: 50 U/L — ABNORMAL HIGH (ref 0–44)
AST: 42 U/L — ABNORMAL HIGH (ref 15–41)
Albumin: 4.8 g/dL (ref 3.5–5.0)
Alkaline Phosphatase: 74 U/L (ref 38–126)
Anion gap: 12 (ref 5–15)
BUN: 20 mg/dL (ref 8–23)
CO2: 26 mmol/L (ref 22–32)
Calcium: 9.3 mg/dL (ref 8.9–10.3)
Chloride: 99 mmol/L (ref 98–111)
Creatinine, Ser: 0.95 mg/dL (ref 0.44–1.00)
GFR, Estimated: >60 mL/min (ref >60)
Glucose, Bld: 79 mg/dL (ref 70–99)
Potassium: 4.2 mmol/L (ref 3.5–5.1)
Sodium: 137 mmol/L (ref 135–145)
Total Bilirubin: 0.2 mg/dL (ref 0.0–1.2)
Total Protein: 7.5 g/dL (ref 6.5–8.1)

## 2024-05-24 LAB — CBC WITH DIFFERENTIAL/PLATELET
Abs Immature Granulocytes: 0.01 K/uL (ref 0.00–0.07)
Basophils Absolute: 0.1 K/uL (ref 0.0–0.1)
Basophils Relative: 1 %
Eosinophils Absolute: 0.1 K/uL (ref 0.0–0.5)
Eosinophils Relative: 3 %
HCT: 41.6 % (ref 36.0–46.0)
Hemoglobin: 13.3 g/dL (ref 12.0–15.0)
Immature Granulocytes: 0 %
Lymphocytes Relative: 33 %
Lymphs Abs: 1.8 K/uL (ref 0.7–4.0)
MCH: 29.9 pg (ref 26.0–34.0)
MCHC: 32 g/dL (ref 30.0–36.0)
MCV: 93.5 fL (ref 80.0–100.0)
Monocytes Absolute: 0.4 K/uL (ref 0.1–1.0)
Monocytes Relative: 8 %
Neutro Abs: 2.9 K/uL (ref 1.7–7.7)
Neutrophils Relative %: 55 %
Platelets: 252 K/uL (ref 150–400)
RBC: 4.45 MIL/uL (ref 3.87–5.11)
RDW: 12.8 % (ref 11.5–15.5)
WBC: 5.4 K/uL (ref 4.0–10.5)
nRBC: 0 % (ref 0.0–0.2)

## 2024-05-24 LAB — TROPONIN T, HIGH SENSITIVITY
Troponin T High Sensitivity: 103 ng/L (ref 0–19)
Troponin T High Sensitivity: 92 ng/L — ABNORMAL HIGH (ref 0–19)

## 2024-05-24 LAB — CBG MONITORING, ED: Glucose-Capillary: 90 mg/dL (ref 70–99)

## 2024-05-24 MED ORDER — SODIUM CHLORIDE 0.9 % IV BOLUS
500.0000 mL | Freq: Once | INTRAVENOUS | Status: AC
  Administered 2024-05-24: 500 mL via INTRAVENOUS

## 2024-05-24 MED ORDER — ONDANSETRON HCL 4 MG/2ML IJ SOLN
4.0000 mg | Freq: Once | INTRAMUSCULAR | Status: AC
  Administered 2024-05-24: 4 mg via INTRAVENOUS
  Filled 2024-05-24: qty 2

## 2024-05-24 MED ORDER — FENTANYL CITRATE (PF) 100 MCG/2ML IJ SOLN
25.0000 ug | Freq: Once | INTRAMUSCULAR | Status: AC
  Administered 2024-05-24: 25 ug via INTRAVENOUS
  Filled 2024-05-24: qty 2

## 2024-05-24 MED ORDER — OXYCODONE HCL 5 MG PO TABS
5.0000 mg | ORAL_TABLET | Freq: Once | ORAL | Status: AC
  Administered 2024-05-24: 5 mg via ORAL
  Filled 2024-05-24: qty 1

## 2024-05-24 NOTE — Discharge Instructions (Signed)
 Please follow-up closely with your primary care doctor on an outpatient basis.  Return to emergency department immediately for any new or worsening symptoms.

## 2024-05-24 NOTE — ED Triage Notes (Signed)
 Pt states she fell in her house around 1300 today striking her head and both knees. Pt thinks she blacked out for a few seconds. Denies taking thinners. States she has felt dizzy and lightheaded today. Pt was fasting for a MRI today.

## 2024-05-24 NOTE — ED Provider Notes (Signed)
 [place] [facility]ERGENCY DEPARTMENT AT [facility] Provider Note   CSN: [id number] Arrival date & time: 05/24/24  1412     Patient presents with: Kayla Ryan is a 68 y.o. female.   Patient is a 68 year old female who presents to the emergency department following a fall which occurred just prior to arrival.  Patient notes that she has been fasting today for an MRI and notes that she turned in her home and became dizzy causing her to fall to the ground hitting the left side of her head as well as bilateral knees.  She is currently complaining of pain to her neck and back.  She notes that she did pass out during the fall.  She denies any active dizziness or lightheadedness at this point.  She denies any chest pain, shortness of breath or palpitations.  She denies any abdominal pain.  She denies any associated nausea, vomiting, diarrhea.  She notes that she has no other long bone or joint pain at this time.  She did drive herself to the emergency department.   Fall Associated symptoms include headaches.       Prior to Admission medications   Medication Sig Start Date End Date Taking? Authorizing Provider  Accu-Chek Softclix Lancets lancets Use as instructed 3 times daily. 04/29/24   Tobie Suzzane POUR, MD  acyclovir  (ZOVIRAX ) 400 MG tablet TAKE 1 TABLET(400 MG) BY MOUTH THREE TIMES DAILY Patient not taking: Reported on 05/22/2024 12/25/23   Tobie Suzzane POUR, MD  albuterol  (PROVENTIL ) (2.5 MG/3ML) 0.083% nebulizer solution Take 3 mLs (2.5 mg total) by nebulization every 6 (six) hours as needed for wheezing or shortness of breath. 05/30/23 05/29/24  Parrett, Madelin RAMAN, NP  Alcohol  Swabs  PADS Use it as directed before blood glucose check 3 times daily. 04/29/24   Tobie Suzzane POUR, MD  ASPERCREME LIDOCAINE  EX Apply 1 Application topically 3 (three) times daily as needed (pain).    [provider]  azithromycin  (ZITHROMAX ) 250 MG tablet Take 1 tablet (250 mg total) by mouth  daily. Patient not taking: Reported on 05/22/2024 04/04/24   Shelah Lamar RAMAN, MD  Blood Pressure Monitoring (BLOOD PRESSURE MONITOR 3) DEVI Check blood pressure once daily 1 hour after taking blood pressure medication 04/23/24   Tobie Suzzane POUR, MD  cetirizine  (ZYRTEC ) 10 MG tablet Take 10 mg by mouth 2 (two) times daily.    [provider]  Continuous Glucose Receiver (DEXCOM G7 RECEIVER) DEVI USE TO CHECK BLOOD GLUCOSE AS NEEDED 12/08/22   Patel, Rutwik K, MD  Continuous Glucose Sensor (DEXCOM G7 SENSOR) MISC USE TO CHECK BLOOD SUGAR THREE TIMES DAILY, BEFORE MEALS AND AT BEDTIME AND AS NEEDED. CHANGE SENSOR EVERY 10 DAYS 04/22/24   Tobie Suzzane POUR, MD  dicyclomine  (BENTYL ) 10 MG capsule Take 1 capsule (10 mg total) by mouth 4 (four) times daily -  before meals and at bedtime. Monitor for constipation, dry mouth, dizziness 11/28/23   Shirlean Therisa ORN, NP  diphenhydrAMINE  (BENADRYL ) 25 MG tablet Take 50 mg by mouth daily as needed for allergies or itching.    [provider]  doxepin  (SINEQUAN ) 10 MG capsule TAKE 1 CAPSULE(10 MG) BY MOUTH AT BEDTIME 03/25/24   Tobie Suzzane POUR, MD  DULoxetine  (CYMBALTA ) 60 MG capsule TAKE 1 CAPSULE(60 MG) BY MOUTH TWICE DAILY 04/26/24   Tobie Suzzane POUR, MD  ezetimibe  (ZETIA ) 10 MG tablet Take 1 tablet (10 mg total) by mouth daily. 03/04/24 06/02/24  Debera Jayson MATSU,  MD  gabapentin  (NEURONTIN ) 800 MG tablet Take 1 tablet (800 mg total) by mouth 3 (three) times daily. TAKE 1 TABLET(800 MG) BY MOUTH THREE TIMES DAILY 03/26/24   Tobie Suzzane POUR, MD  Glucagon  (GVOKE HYPOPEN  2-PACK) 1 MG/0.2ML SOAJ Inject 0.2 mLs into the skin as needed (Blood glucose < 53). 04/11/23   Patel, Suzzane POUR, MD  glucose blood (ACCU-CHEK GUIDE TEST) test strip Use as instructed 3 times daily. 04/29/24   Tobie Suzzane POUR, MD  Glycerin-Hypromellose-PEG 400 (DRY EYE RELIEF DROPS OP) Place 1 drop into both eyes 3 (three) times daily as needed (Dry eye).    [provider]   HYDROcodone -acetaminophen  (NORCO) 10-325 MG tablet Take 1 tablet by mouth every 4 (four) hours as needed. Stop Hydrocodone  7.5/325mg  tablet. 05/02/22     ipratropium (ATROVENT  HFA) 17 MCG/ACT inhaler Inhale 2 puffs into the lungs every 4 (four) hours as needed (COPD). 01/20/23   Shelah Lamar RAMAN, MD  isosorbide  dinitrate (ISORDIL ) 30 MG tablet Take 1 tablet (30 mg total) by mouth daily. 03/01/24   Tobie Suzzane POUR, MD  JARDIANCE  10 MG TABS tablet TAKE 1 TABLET(10 MG) BY MOUTH DAILY BEFORE BREAKFAST 01/30/24   Patel, Rutwik K, MD  Lancets Misc. (ACCU-CHEK SOFTCLIX LANCET DEV) KIT USE TO CHECK GLUCOSE THREE TIMES DAILY 09/26/22   Tobie Suzzane POUR, MD  LORazepam  (ATIVAN ) 0.5 MG tablet TAKE 1 TABLET(0.5 MG) BY MOUTH THREE TIMES DAILY AS NEEDED FOR ANXIETY 02/23/24   Tobie Suzzane POUR, MD  Magnesium  Hydroxide (MILK OF MAGNESIA PO) Take by mouth. Takes as needed    [provider]  meclizine  (ANTIVERT ) 25 MG tablet TAKE 1 TABLET(25 MG) BY MOUTH THREE TIMES DAILY AS NEEDED FOR DIZZINESS 05/02/24   Tobie Suzzane POUR, MD  methocarbamol  (ROBAXIN ) 500 MG tablet Take 1 tablet (500 mg total) by mouth every 8 (eight) hours as needed for muscle spasms. 05/06/24   Tobie Suzzane POUR, MD  Misc. Devices KIT Henmnii rollator walker 11/01/22   Tobie Suzzane POUR, MD  montelukast  (SINGULAIR ) 10 MG tablet TAKE 1 TABLET(10 MG) BY MOUTH AT BEDTIME 02/19/24   Shelah Lamar RAMAN, MD  MOUNJARO  5 MG/0.5ML Pen ADMINISTER 5 MG UNDER THE SKIN 1 TIME A WEEK 12/25/23   Tobie Suzzane POUR, MD  nitroGLYCERIN  (NITROSTAT ) 0.4 MG SL tablet Place 1 tablet (0.4 mg total) under the tongue every 5 (five) minutes as needed for chest pain. 08/03/23 05/22/24  Debera Jayson MATSU, MD  omeprazole  (PRILOSEC) 40 MG capsule Take 1 capsule (40 mg total) by mouth 2 (two) times daily. 05/06/24   Lamon Pleasant HERO, PA-C  ondansetron  (ZOFRAN -ODT) 8 MG disintegrating tablet DISSOLVE 1 TABLET EVERY 8 HOURS AS NEEDED FOR FOR NAUSEA AND VOMITING 11/10/23   Rogers Hai,  MD  STIOLTO RESPIMAT  2.5-2.5 MCG/ACT AERS INHALE 2 PUFFS INTO THE LUNGS DAILY 07/26/23   Tobie Suzzane POUR, MD  Vitamin D , Ergocalciferol , (DRISDOL ) 1.25 MG (50000 UNIT) CAPS capsule Take 1 capsule (50,000 Units total) by mouth every 7 (seven) days. 02/06/24   Tobie Suzzane POUR, MD  XIFAXAN  550 MG TABS tablet TAKE 1 TABLET(550 MG) BY MOUTH THREE TIMES DAILY FOR 14 DAYS Patient not taking: Reported on 05/22/2024 01/30/24   Shirlean Therisa ORN, NP    Allergies: Sulfa antibiotics, Chicken allergy, Clindamycin /lincomycin, Dilaudid  [hydromorphone  hcl], Ferrlecit [na ferric gluc cplx in sucrose], Iron , Metronidazole, Other, Prednisone , Statins, Tuna oil [fish oil], Budesonide , Cephalexin, Methotrexate and trimetrexate, Morphine  and codeine, and Tomato    Review of Systems  Musculoskeletal:  Positive for back pain and neck pain.  Neurological:  Positive for headaches.  All other systems reviewed and are negative.   Updated Vital Signs BP (!) 142/52 (BP Location: Right Arm)   Pulse 93   Temp 98.6 F (37 C) (Oral)   Resp 16   Ht 5' 8 (1.727 m)   Wt 70 kg   SpO2 98%   BMI 23.46 kg/m   Physical Exam Vitals and nursing note reviewed.  Constitutional:      General: She is not in acute distress.    Appearance: Normal appearance. She is not ill-appearing.  HENT:     Head: Normocephalic and atraumatic.     Nose: Nose normal.     Mouth/Throat:     Mouth: Mucous membranes are moist.  Eyes:     Extraocular Movements: Extraocular movements intact.     Conjunctiva/sclera: Conjunctivae normal.     Pupils: Pupils are equal, round, and reactive to light.  Cardiovascular:     Rate and Rhythm: Normal rate and regular rhythm.     Pulses: Normal pulses.     Heart sounds: Normal heart sounds. No murmur heard.    No gallop.  Pulmonary:     Effort: Pulmonary effort is normal. No respiratory distress.     Breath sounds: Normal breath sounds. No stridor. No wheezing, rhonchi or rales.  Chest:     Chest wall: No  tenderness.  Abdominal:     General: Abdomen is flat. Bowel sounds are normal. There is no distension.     Palpations: Abdomen is soft.     Tenderness: There is no abdominal tenderness. There is no guarding.  Musculoskeletal:        General: Normal range of motion.     Cervical back: Normal range of motion and neck supple. Tenderness present. No rigidity.     Comments: Tenderness palpation noted over bilateral knees, nontender palpation over bilateral lower extremities, pelvis stable to AP lateral compression, nontender palpation over bilateral upper extremities, full range of motion noted throughout, peripheral pulses 2+ upper extremities and lower extremities distally, sensation intact distally, no obvious deformity or bruising, no skin breakdown or ulceration, no lacerations or abrasions  Skin:    General: Skin is warm and dry.     Comments: Area of swelling noted over the posterior lateral aspect of the scalp, no lacerations or abrasions  Neurological:     General: No focal deficit present.     Mental Status: She is alert and oriented to person, place, and time. Mental status is at baseline.     Cranial Nerves: No cranial nerve deficit.     Sensory: No sensory deficit.     Motor: No weakness.     Coordination: Coordination normal.     Gait: Gait normal.  Psychiatric:        Mood and Affect: Mood normal.        Behavior: Behavior normal.        Thought Content: Thought content normal.        Judgment: Judgment normal.     (all labs ordered are listed, but only abnormal results are displayed) Labs Reviewed  COMPREHENSIVE METABOLIC PANEL WITH GFR  CBC WITH DIFFERENTIAL/PLATELET  URINALYSIS, ROUTINE W REFLEX MICROSCOPIC  CBG MONITORING, ED  TROPONIN T, HIGH SENSITIVITY    EKG: None  Radiology: MM 3D DIAGNOSTIC MAMMOGRAM BILATERAL BREAST Result Date: 05/23/2024 CLINICAL DATA:  68 year old woman with palpable lump of the outer LEFT breast presents for diagnostic LEFT  and  annual RIGHT mammogram. The most recent prior mammogram was performed in 2017. EXAM: DIGITAL DIAGNOSTIC BILATERAL MAMMOGRAM WITH TOMOSYNTHESIS AND CAD; ULTRASOUND LEFT BREAST LIMITED; ULTRASOUND RIGHT BREAST LIMITED TECHNIQUE: Bilateral digital diagnostic mammography and breast tomosynthesis was performed. The images were evaluated with computer-aided detection. ; Targeted ultrasound examination of the left breast was performed.; Targeted ultrasound examination of the right breast was performed COMPARISON:  Previous exam(s). ACR Breast Density Category c: The breasts are heterogeneously dense, which may obscure small masses. FINDINGS: RIGHT: Mammogram: Oval circumscribed mass is seen in the lower inner RIGHT breast. No additional suspicious mass, distortion, or microcalcifications are identified to suggest presence of malignancy. Ultrasound: Targeted sonographic evaluation of the RIGHT breast was performed. 0.3 x 0.2 x 0.3 cm oval circumscribed hypoechoic mass seen at 5 o'clock 3 CMFN corresponds to the mammographic mass. This is favored to be a mildly complicated cyst. LEFT: Mammogram: Oval obscured mass with pleomorphic calcifications is seen in the retroareolar LEFT breast. Oval circumscribed mass seen in the upper outer LEFT breast at middle depth. Ultrasound: Targeted sonographic evaluation of the LEFT breast was performed. 0.4 x 0.4 x 0.4 cm oval circumscribed hypoechoic mass is seen in the retroareolar region at 3 o'clock. This mass does not have a definite mammographic correlate. 1.0 x 0.5 x 0.8 cm irregularly-shaped hypoechoic mass with indistinct and angular margins seen at 12 o'clock retroareolar corresponds to the mammographic mass seen in the retroareolar region. This mass contains internal calcifications, which can be best seen on the cine clips. 0.4 x 0.3 x 0.4 cm oval circumscribed anechoic mass at 3 o'clock 5 CMFN does not have a mammographic correlate. This is consistent with a benign simple cyst.  0.6 x 0.3 x 0.5 cm oval circumscribed anechoic mass seen at 2 o'clock 5 CMFN corresponds to the mammographic mass seen in the upper outer LEFT breast at middle depth. This is consistent with a benign simple cyst. No enlarged LEFT axillary lymph nodes are seen. IMPRESSION: 1. Highly suspicious 1.0 cm LEFT breast mass with pleomorphic calcifications (12 o'clock retroareolar) corresponding to the mammographic mass. Ultrasound-guided core needle biopsy recommended. 2. Suspicious 0.4 cm LEFT breast mass (3 o'clock retroareolar), without a definite mammographic correlate. Ultrasound-guided core needle biopsy recommended. 3. Indeterminate 0.3 cm RIGHT breast mass (5 o'clock 3 CMFN), corresponding to the mammographic mass. Ultrasound-guided core needle biopsy recommended. RECOMMENDATION: 1. Ultrasound-guided core needle biopsy of 1.0 cm LEFT breast mass (12 o'clock retroareolar). 2. Ultrasound-guided biopsy of 0.4 cm LEFT breast mass (3 o'clock retroareolar). 3. Ultrasound-guided core needle biopsy of 0.3 cm RIGHT breast mass (5 o'clock 3 CMFN). I have discussed the findings and recommendations with the patient. The biopsy procedure was explained to the patient and questions were answered. Patient expressed their understanding of the biopsy recommendation. Patient will be scheduled for biopsy at her earliest convenience by the schedulers. Ordering provider will be notified. If applicable, a reminder letter will be sent to the patient regarding the next appointment. BI-RADS CATEGORY  5: Highly suggestive of malignancy. Electronically Signed   By: Aliene Lloyd M.D.   On: 05/23/2024 17:45   US  LIMITED ULTRASOUND INCLUDING AXILLA RIGHT BREAST Result Date: 05/23/2024 CLINICAL DATA:  68 year old woman with palpable lump of the outer LEFT breast presents for diagnostic LEFT and annual RIGHT mammogram. The most recent prior mammogram was performed in 2017. EXAM: DIGITAL DIAGNOSTIC BILATERAL MAMMOGRAM WITH TOMOSYNTHESIS AND CAD;  ULTRASOUND LEFT BREAST LIMITED; ULTRASOUND RIGHT BREAST LIMITED TECHNIQUE: Bilateral digital diagnostic mammography and breast  tomosynthesis was performed. The images were evaluated with computer-aided detection. ; Targeted ultrasound examination of the left breast was performed.; Targeted ultrasound examination of the right breast was performed COMPARISON:  Previous exam(s). ACR Breast Density Category c: The breasts are heterogeneously dense, which may obscure small masses. FINDINGS: RIGHT: Mammogram: Oval circumscribed mass is seen in the lower inner RIGHT breast. No additional suspicious mass, distortion, or microcalcifications are identified to suggest presence of malignancy. Ultrasound: Targeted sonographic evaluation of the RIGHT breast was performed. 0.3 x 0.2 x 0.3 cm oval circumscribed hypoechoic mass seen at 5 o'clock 3 CMFN corresponds to the mammographic mass. This is favored to be a mildly complicated cyst. LEFT: Mammogram: Oval obscured mass with pleomorphic calcifications is seen in the retroareolar LEFT breast. Oval circumscribed mass seen in the upper outer LEFT breast at middle depth. Ultrasound: Targeted sonographic evaluation of the LEFT breast was performed. 0.4 x 0.4 x 0.4 cm oval circumscribed hypoechoic mass is seen in the retroareolar region at 3 o'clock. This mass does not have a definite mammographic correlate. 1.0 x 0.5 x 0.8 cm irregularly-shaped hypoechoic mass with indistinct and angular margins seen at 12 o'clock retroareolar corresponds to the mammographic mass seen in the retroareolar region. This mass contains internal calcifications, which can be best seen on the cine clips. 0.4 x 0.3 x 0.4 cm oval circumscribed anechoic mass at 3 o'clock 5 CMFN does not have a mammographic correlate. This is consistent with a benign simple cyst. 0.6 x 0.3 x 0.5 cm oval circumscribed anechoic mass seen at 2 o'clock 5 CMFN corresponds to the mammographic mass seen in the upper outer LEFT breast at  middle depth. This is consistent with a benign simple cyst. No enlarged LEFT axillary lymph nodes are seen. IMPRESSION: 1. Highly suspicious 1.0 cm LEFT breast mass with pleomorphic calcifications (12 o'clock retroareolar) corresponding to the mammographic mass. Ultrasound-guided core needle biopsy recommended. 2. Suspicious 0.4 cm LEFT breast mass (3 o'clock retroareolar), without a definite mammographic correlate. Ultrasound-guided core needle biopsy recommended. 3. Indeterminate 0.3 cm RIGHT breast mass (5 o'clock 3 CMFN), corresponding to the mammographic mass. Ultrasound-guided core needle biopsy recommended. RECOMMENDATION: 1. Ultrasound-guided core needle biopsy of 1.0 cm LEFT breast mass (12 o'clock retroareolar). 2. Ultrasound-guided biopsy of 0.4 cm LEFT breast mass (3 o'clock retroareolar). 3. Ultrasound-guided core needle biopsy of 0.3 cm RIGHT breast mass (5 o'clock 3 CMFN). I have discussed the findings and recommendations with the patient. The biopsy procedure was explained to the patient and questions were answered. Patient expressed their understanding of the biopsy recommendation. Patient will be scheduled for biopsy at her earliest convenience by the schedulers. Ordering provider will be notified. If applicable, a reminder letter will be sent to the patient regarding the next appointment. BI-RADS CATEGORY  5: Highly suggestive of malignancy. Electronically Signed   By: Aliene Lloyd M.D.   On: 05/23/2024 17:45   US  LIMITED ULTRASOUND INCLUDING AXILLA LEFT BREAST  Result Date: 05/23/2024 CLINICAL DATA:  68 year old woman with palpable lump of the outer LEFT breast presents for diagnostic LEFT and annual RIGHT mammogram. The most recent prior mammogram was performed in 2017. EXAM: DIGITAL DIAGNOSTIC BILATERAL MAMMOGRAM WITH TOMOSYNTHESIS AND CAD; ULTRASOUND LEFT BREAST LIMITED; ULTRASOUND RIGHT BREAST LIMITED TECHNIQUE: Bilateral digital diagnostic mammography and breast tomosynthesis was  performed. The images were evaluated with computer-aided detection. ; Targeted ultrasound examination of the left breast was performed.; Targeted ultrasound examination of the right breast was performed COMPARISON:  Previous exam(s). ACR Breast Density Category  c: The breasts are heterogeneously dense, which may obscure small masses. FINDINGS: RIGHT: Mammogram: Oval circumscribed mass is seen in the lower inner RIGHT breast. No additional suspicious mass, distortion, or microcalcifications are identified to suggest presence of malignancy. Ultrasound: Targeted sonographic evaluation of the RIGHT breast was performed. 0.3 x 0.2 x 0.3 cm oval circumscribed hypoechoic mass seen at 5 o'clock 3 CMFN corresponds to the mammographic mass. This is favored to be a mildly complicated cyst. LEFT: Mammogram: Oval obscured mass with pleomorphic calcifications is seen in the retroareolar LEFT breast. Oval circumscribed mass seen in the upper outer LEFT breast at middle depth. Ultrasound: Targeted sonographic evaluation of the LEFT breast was performed. 0.4 x 0.4 x 0.4 cm oval circumscribed hypoechoic mass is seen in the retroareolar region at 3 o'clock. This mass does not have a definite mammographic correlate. 1.0 x 0.5 x 0.8 cm irregularly-shaped hypoechoic mass with indistinct and angular margins seen at 12 o'clock retroareolar corresponds to the mammographic mass seen in the retroareolar region. This mass contains internal calcifications, which can be best seen on the cine clips. 0.4 x 0.3 x 0.4 cm oval circumscribed anechoic mass at 3 o'clock 5 CMFN does not have a mammographic correlate. This is consistent with a benign simple cyst. 0.6 x 0.3 x 0.5 cm oval circumscribed anechoic mass seen at 2 o'clock 5 CMFN corresponds to the mammographic mass seen in the upper outer LEFT breast at middle depth. This is consistent with a benign simple cyst. No enlarged LEFT axillary lymph nodes are seen. IMPRESSION: 1. Highly suspicious 1.0  cm LEFT breast mass with pleomorphic calcifications (12 o'clock retroareolar) corresponding to the mammographic mass. Ultrasound-guided core needle biopsy recommended. 2. Suspicious 0.4 cm LEFT breast mass (3 o'clock retroareolar), without a definite mammographic correlate. Ultrasound-guided core needle biopsy recommended. 3. Indeterminate 0.3 cm RIGHT breast mass (5 o'clock 3 CMFN), corresponding to the mammographic mass. Ultrasound-guided core needle biopsy recommended. RECOMMENDATION: 1. Ultrasound-guided core needle biopsy of 1.0 cm LEFT breast mass (12 o'clock retroareolar). 2. Ultrasound-guided biopsy of 0.4 cm LEFT breast mass (3 o'clock retroareolar). 3. Ultrasound-guided core needle biopsy of 0.3 cm RIGHT breast mass (5 o'clock 3 CMFN). I have discussed the findings and recommendations with the patient. The biopsy procedure was explained to the patient and questions were answered. Patient expressed their understanding of the biopsy recommendation. Patient will be scheduled for biopsy at her earliest convenience by the schedulers. Ordering provider will be notified. If applicable, a reminder letter will be sent to the patient regarding the next appointment. BI-RADS CATEGORY  5: Highly suggestive of malignancy. Electronically Signed   By: Aliene Lloyd M.D.   On: 05/23/2024 17:45     Procedures   Medications Ordered in the ED  sodium chloride  0.9 % bolus 500 mL (has no administration in time range)                                    Medical Decision Making Amount and/or Complexity of Data Reviewed Labs: ordered. Radiology: ordered.  Risk Prescription drug management.   Patient is doing well at this time and is stable for discharge home.  Discussed with patient that all workup in the emergency department has been unremarkable.  CT scan of the head, cervical spine, thoracic spine, lumbar spine were unremarkable signs of acute intracranial hemorrhage or fracture.  X-rays of bilateral knees  were unremarkable as well.  She is  feeling back to her baseline at this time and is eager for discharge home.  Discussed with patient about the elevated troponins but do suspect this is a chronic ongoing issue for her from her CHF.  She will follow-up with her primary care doctor and cardiologist for this.  Do not suspect that admission is warranted at this time.  EKG had no acute ischemic changes.  Urinalysis demonstrated no indication for urinary tract infection.  Blood work is otherwise at baseline at this time.  She had no tenderness over chest wall and abdomen.  There was no tenderness noted to remainder of long bones and joints and she has no concerning neurological deficits.  Strict return precautions were provided for any new or worsening symptoms.  Patient voiced understanding and had no additional questions.     Final diagnoses:  None    ED Discharge Orders     None          Daralene Lonni JONETTA DEVONNA 05/24/24 1926    Towana Ozell BROCKS, MD 05/25/24 1018

## 2024-05-24 NOTE — ED Notes (Signed)
 Pt is comfortable at this time. Pt was given crackers and soda to maintain glucose levels.

## 2024-05-24 NOTE — ED Notes (Signed)
 Nurse called lab to check on 2nd troponin

## 2024-05-27 ENCOUNTER — Other Ambulatory Visit: Payer: Self-pay | Admitting: Internal Medicine

## 2024-05-27 DIAGNOSIS — E1149 Type 2 diabetes mellitus with other diabetic neurological complication: Secondary | ICD-10-CM

## 2024-05-27 NOTE — Progress Notes (Unsigned)
 VIRTUAL VISIT via TELEPHONE NOTE [facility]   I connected with Kayla Ryan  on 05/28/2024 at 11:20 AM by telephone and verified that I am speaking with the correct person using two identifiers.  Location: Patient: Home Provider: [facility]   I discussed the limitations, risks, security and privacy concerns of performing an evaluation and management service by telephone and the availability of in person appointments. I also discussed with the patient that there may be a patient responsible charge related to this service. The patient expressed understanding and agreed to proceed.  REASON FOR VISIT:  Follow-up for iron  deficiency anemia   CURRENT THERAPY: IV iron  infusions   INTERVAL HISTORY:  Ms. Kayla Ryan is contacted today for follow-up of iron  deficiency anemia.   She was last evaluated via telemedicine visit by Pleasant Barefoot PA-C on 01/30/2024.  Overall, at today's visit she reports feeling fairly well, apart from some ongoing soreness after a hard fall last week. She does report recent abnormal mammogram, awaiting biopsy. She is also following with gastroenterology for liver issues.  She reports some fatigue.    She continues to have issues with chronic diarrhea. She has intermittent melanotic (coffee-ground diarrhea) stool, following with gastroenterology.  She denies any bright red blood per rectum.   Her ice pica resolved. Her headaches and lightheadedness improved, but she does have some vertigo related to inner ear. Other symptoms as reported in ROS below.   She has 25% energy and 100% appetite.  She endorses that she is maintaining a stable weight.  ASSESSMENT & PLAN:  1.  Normocytic anemia: - Etiology chronic GI blood loss, CKD stage IIIb, and iron  deficiency.  - Hospitalized in November 2022 with Hgb 5.8, s/p 4 units PRBC - Most recent blood transfusion was in January 2023. - Colonoscopy and EGD (04/30/2021) showed no  obvious source of blood loss. - Capsule study (09/22/2021): Few gastric erosions, nonbleeding.  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 - Most recent EGD (12/14/2023): No active bleeding.  (Esophageal plaques, suspicious for candidiasis.  Gastritis.) - She cannot take oral iron  due to allergies (tongue swells, rash, loin pain).  High intolerance of IV iron  with significant side effects (see below). - Most recent IV iron  with Ferrlecit x 3 in December 2024.  (See below) - Apart from the side effects of the iron  itself, she reports that she felt improved energy after her IV iron .  - She has black tarry stools intermittently - Most recent labs (05/21/2024): Hgb 13.7/MCV 94.1, ferritin 116, iron  saturation 11% with normal TIBC.  (Previously noted thrombocytosis has resolved) - PLAN: Since she is asymptomatic and without anemia, we will hold off on any IV iron  even though iron  saturation is <20.  - Labs and RTC in 6 months - Patient advised to continue close follow-up with GI.  She is reportedly being worked up for liver issues.   2.  Intolerance of IV iron  infusions  - Reports previous reactions to iron  infusions with Venofer  (severe nausea, rash, itching, myalgias). She has received IV Venofer  100 mg every 5 days in [facility] under the direction of Dr. Madison.  (Per notes by Dr. Madison from 2020, patient was receiving monthly Venofer  as maintenance.)  She is also unable to tolerate steroid premedications due to agitation and mood changes. - Patient experienced severe low back pain, nausea, vomiting, and diarrhea after IV Venofer  received in July/August 2023 - Trial of Feraheme  in November/December 2023,  due to more favorable adverse event profile Low-dose Feraheme  (90 mg) on 06/15/2022, and reported diarrhea and nausea for the next several days.   Full dose Feraheme  (510 mg) on 08/04/2022, and called the next day to report severe diarrhea and rash. Feraheme  (510 mg) x 2  on 03/24/2023 and  04/03/2023.  Called on 04/05/2023 to report severe nausea, diarrhea, abdominal distention, rash across her abdomen, severe leg cramps extending into her thighs, and difficulty swallowing.  Advised to go to ED, but declined to do so.   -Trial of Ferrlecit in December 2024, but unable to tolerate due to diffuse pruritus (no rash) experienced during infusion, despite premedicating with IV Solu-Medrol . - Premedication including IV Solu-Medrol , IV Pepcid , IV cetirizine , and Tylenol .  (Patient previously requested to avoid IV steroids due to side effects of mood swings and aggression, but she has been unable to tolerate iron  infusions without steroids.) - Also requires antiemetic prior to infusions. Zofran  and dicyclomine  improved her nausea and diarrhea after iron  infusions. Has at times required prn anxiolytic. - PLAN: Overall, patient feels that she tolerated Feraheme  the best, as she denies any hypersensitivity symptoms during infusion, but did have some reported diarrhea and nausea following Feraheme  infusions.     3.  Noncompliance / barriers to care - Patient has had frequent cancellations of her IV iron  - Patient cites reason for cancellations as being severe chronic diarrhea related to her IBS, somewhat relieved with Bentyl  and Imodium, following with GI.  Diarrhea tends to be better in the afternoons, therefore she prefers afternoon appointments for IV iron . 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 - After appointment and discussion of the above in August 2024, patient was apologetic.  Patient has exhibited significant effort at compliance and has done much better attending her appointments and treatments since that time. - Assessed by clinical social worker on 07/13/2023 - Patient given information on local therapists who may be able to assist with generalized anxiety and processing trauma   4.  Abnormal mammogram - Mammogram ordered by PCP  (05/23/2024) with highly suspicious LEFT breast mass, second suspicious LEFT breast mass, and indeterminate RIGHT breast mass. - PLAN: Proceed with ultrasound-guided core needle biopsy of bilateral breasts as ordered by PCP.  Patient is aware that if testing shows breast cancer, she will need new patient referral to cancer center, if she chooses to receive oncology care here.   5. Other history - PMH: CKD stage IIIb.  COPD with chronic hypoxic respiratory failure (on 3 L supplemental oxygen  continuously), dermatomyositis/muscular dystrophy, GERD, history of Crohn's disease but with recent EGD/colonoscopy negative for any signs of Crohn's. - She is widowed.  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  Quit smoking 20+ years ago. - Mother had lung cancer and father had lung cancer.  Niece had melanoma.   PLAN SUMMARY:  >> Labs (CBC/D, ferritin, iron /TIBC) in 6 months >> OFFICE visit in 6 months (after labs)   ** Last office visit 03/20/23      REVIEW OF SYSTEMS:   Review of Systems  Constitutional:  Negative for chills, diaphoresis and fever.  HENT:  Negative for nosebleeds.   Respiratory:  Positive for cough and shortness of breath. Negative for hemoptysis.   Cardiovascular:  Negative for chest pain and palpitations.  Gastrointestinal:  Positive for diarrhea, nausea and vomiting. Negative for abdominal pain, blood in stool and constipation.  Genitourinary:  Negative for hematuria.  Skin: Negative.   Neurological:  Positive for dizziness and headaches.  Endo/Heme/Allergies:  Does not bruise/bleed easily.     PHYSICAL EXAM: (per limitations of virtual telephone visit)  The patient is alert and oriented x 3, exhibiting adequate mentation, good mood, and ability to speak in full sentences and execute sound judgement.  WRAP UP:   I discussed the assessment and treatment plan with the patient. The patient was provided an  opportunity to ask questions and all were answered. The patient agreed with the plan and demonstrated an understanding of the instructions.   The patient was advised to call back or seek an in-person evaluation if the symptoms worsen or if the condition fails to improve as anticipated.  I provided 22 minutes of non-face-to-face time during this encounter, including >10 minutes of medical discussion.  Pleasant CHRISTELLA Barefoot, PA-C 05/28/2024 11:51 AM

## 2024-05-28 ENCOUNTER — Ambulatory Visit: Admitting: Physician Assistant

## 2024-05-28 ENCOUNTER — Inpatient Hospital Stay: Admitting: Physician Assistant

## 2024-05-28 DIAGNOSIS — D500 Iron deficiency anemia secondary to blood loss (chronic): Secondary | ICD-10-CM

## 2024-05-30 ENCOUNTER — Other Ambulatory Visit: Payer: Self-pay | Admitting: Internal Medicine

## 2024-05-30 DIAGNOSIS — E1149 Type 2 diabetes mellitus with other diabetic neurological complication: Secondary | ICD-10-CM

## 2024-06-03 ENCOUNTER — Ambulatory Visit (HOSPITAL_COMMUNITY): Admission: RE | Admit: 2024-06-03 | Source: Ambulatory Visit

## 2024-06-03 ENCOUNTER — Telehealth: Payer: Self-pay

## 2024-06-03 ENCOUNTER — Encounter (HOSPITAL_COMMUNITY): Payer: Self-pay

## 2024-06-03 NOTE — Telephone Encounter (Signed)
 Noted   Ok lets just see if they call me back

## 2024-06-03 NOTE — Telephone Encounter (Addendum)
 Per [facility] website for the mri Notification/Precertification Requirement Precertification Not Required.

## 2024-06-03 NOTE — Telephone Encounter (Signed)
 We don't have any pending for patient. Not sure what procedure this is referring to. She hasn't had a procedure done since April/May

## 2024-06-03 NOTE — Telephone Encounter (Signed)
 Automated message from [facility] regarding this pt and procedure not being approved. They are requesting a peer to peer @ [phone]. The pt's claim number is [id number]. Therisa this is a Dr to Dr regarding this procedure.

## 2024-06-03 NOTE — Telephone Encounter (Signed)
 noted

## 2024-06-03 NOTE — Telephone Encounter (Signed)
 It is likely the MRCP I had ordered.

## 2024-06-04 ENCOUNTER — Other Ambulatory Visit (HOSPITAL_COMMUNITY): Payer: Self-pay | Admitting: Internal Medicine

## 2024-06-04 DIAGNOSIS — R928 Other abnormal and inconclusive findings on diagnostic imaging of breast: Secondary | ICD-10-CM

## 2024-06-06 ENCOUNTER — Encounter (HOSPITAL_COMMUNITY)

## 2024-06-06 ENCOUNTER — Inpatient Hospital Stay (HOSPITAL_COMMUNITY): Admission: RE | Admit: 2024-06-06 | Discharge: 2024-06-06 | Attending: Internal Medicine

## 2024-06-06 ENCOUNTER — Ambulatory Visit (HOSPITAL_COMMUNITY)
Admission: RE | Admit: 2024-06-06 | Discharge: 2024-06-06 | Disposition: A | Discharge status: Home / Self Care | Source: Ambulatory Visit | Attending: Internal Medicine | Admitting: Internal Medicine

## 2024-06-06 ENCOUNTER — Encounter (HOSPITAL_COMMUNITY): Payer: Self-pay

## 2024-06-06 DIAGNOSIS — R928 Other abnormal and inconclusive findings on diagnostic imaging of breast: Secondary | ICD-10-CM

## 2024-06-06 HISTORY — PX: BREAST BIOPSY: SHX20

## 2024-06-06 MED ORDER — LIDOCAINE-EPINEPHRINE (PF) 1 %-1:200000 IJ SOLN
20.0000 mL | Freq: Once | INTRAMUSCULAR | Status: AC
  Administered 2024-06-06: 20 mL via INTRADERMAL

## 2024-06-06 MED ORDER — LIDOCAINE HCL (PF) 2 % IJ SOLN
INTRAMUSCULAR | Status: AC
  Filled 2024-06-06: qty 10

## 2024-06-06 MED ORDER — LIDOCAINE HCL (PF) 2 % IJ SOLN
20.0000 mL | Freq: Once | INTRAMUSCULAR | Status: AC
  Administered 2024-06-06: 20 mL

## 2024-06-06 MED ORDER — LIDOCAINE HCL (PF) 2 % IJ SOLN
INTRAMUSCULAR | Status: AC
  Filled 2024-06-06: qty 20

## 2024-06-06 NOTE — Progress Notes (Signed)
 Patient tolerated left x2 and right x1 breast biopsy well today. Patient verbalized understanding of discharge instructions and ambulatory to [facility] dept for post imaging with no acute distress noted. Patient given ice packs to use. Specimens taken to lab for processing by Lexi from ultrasound.

## 2024-06-10 ENCOUNTER — Encounter: Payer: Self-pay | Admitting: Gastroenterology

## 2024-06-10 ENCOUNTER — Ambulatory Visit (HOSPITAL_COMMUNITY)
Admission: RE | Admit: 2024-06-10 | Discharge: 2024-06-10 | Disposition: A | Discharge status: Home / Self Care | Source: Ambulatory Visit | Attending: Gastroenterology | Admitting: Gastroenterology

## 2024-06-10 DIAGNOSIS — K760 Fatty (change of) liver, not elsewhere classified: Secondary | ICD-10-CM | POA: Insufficient documentation

## 2024-06-10 DIAGNOSIS — R7989 Other specified abnormal findings of blood chemistry: Secondary | ICD-10-CM

## 2024-06-10 LAB — SURGICAL PATHOLOGY

## 2024-06-10 MED ORDER — GADOBUTROL 1 MMOL/ML IV SOLN
5.0000 mL | Freq: Once | INTRAVENOUS | Status: AC | PRN
  Administered 2024-06-10: 5 mL via INTRAVENOUS

## 2024-06-11 ENCOUNTER — Other Ambulatory Visit: Payer: Self-pay | Admitting: Internal Medicine

## 2024-06-11 ENCOUNTER — Encounter: Payer: Self-pay | Admitting: Internal Medicine

## 2024-06-11 ENCOUNTER — Ambulatory Visit: Admitting: Internal Medicine

## 2024-06-11 VITALS — BP 118/76 | HR 87 | Ht 68.0 in | Wt 152.3 lb

## 2024-06-11 DIAGNOSIS — J9611 Chronic respiratory failure with hypoxia: Secondary | ICD-10-CM

## 2024-06-11 DIAGNOSIS — G252 Other specified forms of tremor: Secondary | ICD-10-CM

## 2024-06-11 DIAGNOSIS — R928 Other abnormal and inconclusive findings on diagnostic imaging of breast: Secondary | ICD-10-CM

## 2024-06-11 DIAGNOSIS — E119 Type 2 diabetes mellitus without complications: Secondary | ICD-10-CM | POA: Diagnosis not present

## 2024-06-11 DIAGNOSIS — E785 Hyperlipidemia, unspecified: Secondary | ICD-10-CM

## 2024-06-11 DIAGNOSIS — Z7985 Long-term (current) use of injectable non-insulin antidiabetic drugs: Secondary | ICD-10-CM

## 2024-06-11 DIAGNOSIS — Z7984 Long term (current) use of oral hypoglycemic drugs: Secondary | ICD-10-CM

## 2024-06-11 DIAGNOSIS — I5032 Chronic diastolic (congestive) heart failure: Secondary | ICD-10-CM

## 2024-06-11 DIAGNOSIS — E1149 Type 2 diabetes mellitus with other diabetic neurological complication: Secondary | ICD-10-CM

## 2024-06-11 DIAGNOSIS — J449 Chronic obstructive pulmonary disease, unspecified: Secondary | ICD-10-CM | POA: Diagnosis not present

## 2024-06-11 DIAGNOSIS — E041 Nontoxic single thyroid nodule: Secondary | ICD-10-CM

## 2024-06-11 DIAGNOSIS — Z09 Encounter for follow-up examination after completed treatment for conditions other than malignant neoplasm: Secondary | ICD-10-CM

## 2024-06-11 NOTE — Assessment & Plan Note (Signed)
 Resting tremor with bilateral hand weakness Has a history of recurrent falls Referred to neurology for further evaluation

## 2024-06-11 NOTE — Progress Notes (Signed)
 Established Patient Office Visit  Subjective:  Patient ID: Kayla Ryan, female    DOB: 08-10-1955  Age: 68 y.o. MRN: [id number]  CC:  Chief Complaint  Patient presents with   Diabetes    4 month f/u    Hyperlipidemia    4 month f/u     HPI Kayla Ryan is a 68 y.o. female with past medical history of HTN, HFpEF, COPD, GERD, type II DM with neuropathy, CKD, GAD and chronic pain syndrome who presents for f/u of her chronic medical conditions and recent fall.  Type II DM: She has been taking Mounjaro  and Jardiance , and has been tolerating well.  Her blood glucose has improved now. She has history of hypoglycemia less than 50 in the past.  She has CGM currently.  She is tolerating Jardiance  now.  She has chronic numbness of bilateral foot.  She takes gabapentin  800 mg TID currently.  Denies any polyuria or polyphagia.  She has chronic fatigue.  GAD: She takes Ativan  0.5 mg 3 times daily as needed. She is also on Cymbalta  60 mg BID.  She has history of COPD and chronic hypoxic respiratory failure.  She has Stiolto and as needed albuterol .  She uses 3 LPM O2 at home.  She has chronic low back pain, followed by pain clinic.  She takes Cymbalta  60 mg twice daily, gabapentin  800 mg 3 times daily and Norco 10-325 mg q4h PRN. She also has Robaxin  as needed for muscle spasms. Denies saddle anesthesia, urinary or stool incontinence.  She has history of AVM of small bowel, which had led to chronic GI bleeding in the past.  Denies hematemesis or hematochezia.  She takes omeprazole  40 mg BID for GERD.  She has chronic nausea, planned to get EGD tomorrow.  She reports a fall on 05/24/24, which happened due to dizziness with sudden turning of the head at home.  She went to ER, had CT head, cervical spine, thoracic and lumbar spine, which were negative for any acute injury.  She had x-rays of knees, which were negative for fractures.  She did not lose consciousness.  She had a small hematoma on the  left parietal area, which has been improving now.  She reports recent worsening of the bilateral hand tremors, which are occurring at a resting state as well now.  She has difficulty holding objects due to it.  Past Medical History:  Diagnosis Date   Allergy Unknown   Anemia    Anxiety 1883   Arthritis    Asthma 1980   CHF (congestive heart failure) (HCC) 2021   Chronic bronchitis    Crohn disease (HCC)    Emphysema    Emphysema of lung (HCC) 2003   Fibromyalgia    GERD (gastroesophageal reflux disease)    GI bleed    Herpes    History of blood transfusion    Hyperlipidemia    Hypertension 2015   IBS (irritable bowel syndrome)    Kidney failure    Migraines    Muscular dystrophy (HCC)    Neck pain    Oxygen  deficiency 2021   PAF (paroxysmal atrial fibrillation) (HCC)    Not anticoagulated with history of severe GI bleeding   Plantar fasciitis    Polymyositis (HCC)    PONV (postoperative nausea and vomiting)    Right thyroid  nodule 04/07/2022   Scoliosis    Type 2 diabetes mellitus (HCC)     Past Surgical History:  Procedure Laterality Date  ABDOMINAL HYSTERECTOMY     AGILE CAPSULE N/A 06/24/2021   Procedure: AGILE CAPSULE;  Surgeon: Cindie Carlin POUR, DO;  [facility]: [facility];  Service: Endoscopy;  Laterality: N/A;   AGILE CAPSULE N/A 07/22/2021   Procedure: AGILE CAPSULE;  Surgeon: Shaaron Lamar HERO, MD;  Location: [facility];  Service: Endoscopy;  Laterality: N/A;  7:30am   bone spur     BREAST BIOPSY Left 11/17/2016   fibrocystic changes with associated microcalcifications and a fibroadenoma   BREAST BIOPSY Left 06/06/2024   US  LT BREAST BX W LOC DEV 1ST LESION IMG BX SPEC US  GUIDE 06/06/2024 Mir, Aliene SAUNDERS, MD [facility]   BREAST BIOPSY Right 06/06/2024   US  RT BREAST BX W LOC DEV 1ST LESION IMG BX SPEC US  GUIDE 06/06/2024 Mir, Aliene SAUNDERS, MD [facility]   BREAST BIOPSY Left 06/06/2024   US  LT BREAST BX W LOC DEV EA ADD LESION IMG BX SPEC US  GUIDE  06/06/2024 Mir, Aliene SAUNDERS, MD [facility]   BREAST SURGERY  1995   CHOLECYSTECTOMY     COLON SURGERY  1995   COLONOSCOPY WITH PROPOFOL  N/A 04/30/2021   Procedure: COLONOSCOPY WITH PROPOFOL ;  Surgeon: Shaaron Lamar HERO, MD;  [facility];  Service: Endoscopy;  Laterality: N/A;   COSMETIC SURGERY  1980   ESOPHAGEAL DILATION N/A 04/30/2021   Procedure: ESOPHAGEAL DILATION;  Surgeon: Shaaron Lamar HERO, MD;  [facility];  Service: Endoscopy;  Laterality: N/A;   ESOPHAGEAL DILATION N/A 12/14/2023   Procedure: DILATION, ESOPHAGUS;  Surgeon: Cindie Carlin POUR, DO;  [facility];  Service: Endoscopy;  Laterality: N/A;   ESOPHAGOGASTRODUODENOSCOPY N/A 12/14/2023   Procedure: EGD (ESOPHAGOGASTRODUODENOSCOPY);  Surgeon: Cindie Carlin POUR, DO;  [facility];  Service: Endoscopy;  Laterality: N/A;  9:15 am, asa 3   ESOPHAGOGASTRODUODENOSCOPY (EGD) WITH PROPOFOL  N/A 04/30/2021   Procedure: ESOPHAGOGASTRODUODENOSCOPY (EGD) WITH PROPOFOL ;  Surgeon: Shaaron Lamar HERO, MD;  [facility];  Service: Endoscopy;  Laterality: N/A;   GIVENS CAPSULE STUDY N/A 09/22/2021   Procedure: GIVENS CAPSULE STUDY;  Surgeon: Shaaron Lamar HERO, MD;  [facility];  Service: Endoscopy;  Laterality: N/A;  7:30am   HERNIA REPAIR     LEFT HEART CATHETERIZATION WITH CORONARY ANGIOGRAM N/A 11/07/2013   Procedure: LEFT HEART CATHETERIZATION WITH CORONARY ANGIOGRAM;  Surgeon: Dorn JINNY Lesches, MD;  Location: [facility] CATH [facility];  Service: Cardiovascular;  Laterality: N/A;   MASTECTOMY PARTIAL / LUMPECTOMY     OOPHORECTOMY     ROTATOR CUFF REPAIR     TOOTH EXTRACTION N/A 04/15/2022   Procedure: DENTAL RESTORATION/EXTRACTIONS;  Surgeon: Sheryle Hamilton, DMD;  Location: [facility];  Service: Oral Surgery;  Laterality: N/A;   TOOTH EXTRACTION N/A 09/15/2023   Procedure: DENTAL RESTORATION/EXTRACTIONS;  Surgeon: Sheryle Hamilton, DMD;  Location: [facility];  Service: Oral Surgery;  Laterality: N/A;     Family History  Problem Relation Age of Onset   Lung cancer Mother    Cancer Mother    Miscarriages / Stillbirths Mother    Varicose Veins Mother    Hypertension Mother    Anxiety disorder Mother    Depression Mother    COPD Father    Lung cancer Father    Lymphoma Father    Alcohol  abuse Father    Arthritis Father    Cancer Father    Anxiety disorder Sister    Anxiety disorder Sister    Intellectual disability Sister    Depression Brother  Inflammatory bowel disease Neg Hx    Colon cancer Neg Hx     Social History   Socioeconomic History   Marital status: Widowed    Spouse name: Not on file   Number of children: Not on file   Years of education: Not on file   Highest education level: Some college, no degree  Occupational History   Not on file  Tobacco Use   Smoking status: Former    Current packs/day: 0.00    Average packs/day: 1 pack/day for 49.0 years (49.0 ttl pk-yrs)    Types: Cigarettes    Start date: 08/08/1966    Quit date: 08/31/2005    Years since quitting: 18.7   Smokeless tobacco: Never   Tobacco comments:    It has been 16 years since I smoked a cigarette!!!  Vaping Use   Vaping status: Never Used  Substance and Sexual Activity   Alcohol  use: No   Drug use: No    Comment: used marijuana in teens   Sexual activity: Not Currently    Birth control/protection: Abstinence, Surgical  Other Topics Concern   Not on file  Social History Narrative   Are you right handed or left handed? Left Handed    Are you currently employed ? Retired    What is your current occupation?   Do you live at home alone? Yes   Who lives with you?    What type of home do you live in: 1 story or 2 story? Lives in a one story home        Social Drivers of Health   Financial Resource Strain: High Risk (06/07/2024)   Overall Financial Resource Strain (CARDIA)    Difficulty of Paying Living Expenses: Hard  Food Insecurity: Food Insecurity Present (06/07/2024)    Hunger Vital Sign    Worried About Running Out of Food in the Last Year: Often true    Ran Out of Food in the Last Year: Often true  Transportation Needs: No Transportation Needs (06/07/2024)   PRAPARE - Administrator, Civil Service (Medical): No    Lack of Transportation (Non-Medical): No  Physical Activity: Insufficiently Active (06/07/2024)   Exercise Vital Sign    Days of Exercise per Week: 3 days    Minutes of Exercise per Session: 30 min  Stress: Stress Concern Present (06/07/2024)   [employer] of Occupational Health - Occupational Stress Questionnaire    Feeling of Stress: To some extent  Social Connections: Unknown (06/07/2024)   Social Connection and Isolation Panel    Frequency of Communication with Friends and Family: More than three times a week    Frequency of Social Gatherings with Friends and Family: Never    Attends Religious Services: More than 4 times per year    Active Member of [employer] or Organizations: Patient declined    Attends Banker Meetings: Not on file    Marital Status: Widowed  Intimate Partner Violence: Not At Risk (04/23/2024)   Humiliation, Afraid, Rape, and Kick questionnaire    Fear of Current or Ex-Partner: No    Emotionally Abused: No    Physically Abused: No    Sexually Abused: No    Outpatient Medications Prior to Visit  Medication Sig Dispense Refill   Accu-Chek Softclix Lancets lancets USE TO TEST BLOOD GLUCOSE THREE TIMES DAILY 100 each 0   acyclovir  (ZOVIRAX ) 400 MG tablet TAKE 1 TABLET(400 MG) BY MOUTH THREE TIMES DAILY 90 tablet 3   albuterol  (  PROVENTIL ) (2.5 MG/3ML) 0.083% nebulizer solution Take 3 mLs (2.5 mg total) by nebulization every 6 (six) hours as needed for wheezing or shortness of breath. 75 mL 5   Alcohol  Swabs  PADS Use it as directed before blood glucose check 3 times daily. 100 each 3   ASPERCREME LIDOCAINE  EX Apply 1 Application topically 3 (three) times daily as needed (pain).     Blood  Pressure Monitoring (BLOOD PRESSURE MONITOR 3) DEVI Check blood pressure once daily 1 hour after taking blood pressure medication 1 each 0   cetirizine  (ZYRTEC ) 10 MG tablet Take 10 mg by mouth 2 (two) times daily.     Continuous Glucose Receiver (DEXCOM G7 RECEIVER) DEVI USE TO CHECK BLOOD GLUCOSE AS NEEDED 1 each 0   Continuous Glucose Sensor (DEXCOM G7 SENSOR) MISC USE TO CHECK BLOOD SUGAR THREE TIMES DAILY, BEFORE MEALS AND AT BEDTIME AND AS NEEDED. CHANGE SENSOR EVERY 10 DAYS 3 each 5   dicyclomine  (BENTYL ) 10 MG capsule Take 1 capsule (10 mg total) by mouth 4 (four) times daily -  before meals and at bedtime. Monitor for constipation, dry mouth, dizziness 120 capsule 3   diphenhydrAMINE  (BENADRYL ) 25 MG tablet Take 50 mg by mouth daily as needed for allergies or itching.     doxepin  (SINEQUAN ) 10 MG capsule TAKE 1 CAPSULE(10 MG) BY MOUTH AT BEDTIME 30 capsule 3   DULoxetine  (CYMBALTA ) 60 MG capsule TAKE 1 CAPSULE(60 MG) BY MOUTH TWICE DAILY 60 capsule 3   ezetimibe  (ZETIA ) 10 MG tablet Take 1 tablet (10 mg total) by mouth daily. 90 tablet 3   gabapentin  (NEURONTIN ) 800 MG tablet Take 1 tablet (800 mg total) by mouth 3 (three) times daily. TAKE 1 TABLET(800 MG) BY MOUTH THREE TIMES DAILY 90 tablet 5   Glucagon  (GVOKE HYPOPEN  2-PACK) 1 MG/0.2ML SOAJ Inject 0.2 mLs into the skin as needed (Blood glucose < 53). 0.4 mL 5   glucose blood (ACCU-CHEK GUIDE TEST) test strip Use as instructed 3 times daily. 100 each 12   Glycerin-Hypromellose-PEG 400 (DRY EYE RELIEF DROPS OP) Place 1 drop into both eyes 3 (three) times daily as needed (Dry eye).     HYDROcodone -acetaminophen  (NORCO) 10-325 MG tablet Take 1 tablet by mouth every 4 (four) hours as needed. Stop Hydrocodone  7.5/325mg  tablet. 180 tablet 0   ipratropium (ATROVENT  HFA) 17 MCG/ACT inhaler Inhale 2 puffs into the lungs every 4 (four) hours as needed (COPD). 1 each 4   isosorbide  dinitrate (ISORDIL ) 30 MG tablet Take 1 tablet (30 mg total) by mouth  daily. 90 tablet 1   JARDIANCE  10 MG TABS tablet TAKE 1 TABLET(10 MG) BY MOUTH DAILY BEFORE BREAKFAST 30 tablet 5   Lancets Misc. (ACCU-CHEK SOFTCLIX LANCET DEV) KIT USE TO CHECK GLUCOSE THREE TIMES DAILY 1 kit 0   LORazepam  (ATIVAN ) 0.5 MG tablet TAKE 1 TABLET(0.5 MG) BY MOUTH THREE TIMES DAILY AS NEEDED FOR ANXIETY 90 tablet 3   Magnesium  Hydroxide (MILK OF MAGNESIA PO) Take by mouth. Takes as needed     meclizine  (ANTIVERT ) 25 MG tablet TAKE 1 TABLET(25 MG) BY MOUTH THREE TIMES DAILY AS NEEDED FOR DIZZINESS 30 tablet 1   methocarbamol  (ROBAXIN ) 500 MG tablet Take 1 tablet (500 mg total) by mouth every 8 (eight) hours as needed for muscle spasms. 30 tablet 2   Misc. Devices KIT Henmn[employer] rollator walker 1 kit 0   montelukast  (SINGULAIR ) 10 MG tablet TAKE 1 TABLET(10 MG) BY MOUTH AT BEDTIME 90 tablet 3   MOUNJARO   5 MG/0.5ML Pen ADMINISTER 5 MG UNDER THE SKIN 1 TIME A WEEK 6 mL 1   nitroGLYCERIN  (NITROSTAT ) 0.4 MG SL tablet Place 1 tablet (0.4 mg total) under the tongue every 5 (five) minutes as needed for chest pain. 90 tablet 3   omeprazole  (PRILOSEC) 40 MG capsule Take 1 capsule (40 mg total) by mouth 2 (two) times daily. 180 capsule 3   ondansetron  (ZOFRAN -ODT) 8 MG disintegrating tablet DISSOLVE 1 TABLET EVERY 8 HOURS AS NEEDED FOR FOR NAUSEA AND VOMITING 257 tablet 1   STIOLTO RESPIMAT  2.5-2.5 MCG/ACT AERS INHALE 2 PUFFS INTO THE LUNGS DAILY 4 g 11   Vitamin D , Ergocalciferol , (DRISDOL ) 1.25 MG (50000 UNIT) CAPS capsule Take 1 capsule (50,000 Units total) by mouth every 7 (seven) days. 12 capsule 1   XIFAXAN  550 MG TABS tablet TAKE 1 TABLET(550 MG) BY MOUTH THREE TIMES DAILY FOR 14 DAYS 42 tablet 0   No facility-administered medications prior to visit.    Allergies  Allergen Reactions   Sulfa Antibiotics Shortness Of Breath, Swelling and Rash   Chicken Allergy Diarrhea and Nausea And Vomiting   Clindamycin /Lincomycin Itching and Nausea And Vomiting   Dilaudid  [Hydromorphone  Hcl]      Makes me crazy   Ferrlecit [Na Ferric Gluc Cplx In Sucrose] Itching    Itching and throat tightness   Iron      Throat starts closing up, tongue swells, severe headaches and backpain   Metronidazole Diarrhea and Nausea And Vomiting   Other     Green peas - itching and nauea   Prednisone  Other (See Comments)    Angry     Statins Other (See Comments)    Leg pain   Tuna Oil [Fish Oil] Diarrhea and Nausea And Vomiting   Budesonide  Nausea And Vomiting and Rash    Nebulizing solution    Cephalexin Diarrhea and Nausea And Vomiting   Methotrexate And Trimetrexate Swelling and Rash   Morphine  And Codeine Itching and Anxiety    exreme mood swings   Tomato Itching and Rash    ROS Review of Systems  Constitutional:  Positive for fatigue. Negative for chills and fever.  HENT:  Negative for congestion, sinus pressure, sinus pain and sore throat.   Eyes:  Negative for pain and discharge.  Respiratory:  Positive for shortness of breath. Negative for cough.   Cardiovascular:  Negative for chest pain and palpitations.  Gastrointestinal:  Positive for nausea. Negative for abdominal pain and vomiting.  Endocrine: Negative for polydipsia and polyuria.  Genitourinary:  Negative for dysuria and hematuria.  Musculoskeletal:  Positive for arthralgias, back pain, gait problem and neck pain. Negative for neck stiffness.  Skin:  Negative for rash.  Neurological:  Positive for tremors. Negative for dizziness and weakness.  Psychiatric/Behavioral:  Positive for sleep disturbance. Negative for agitation and behavioral problems. The patient is nervous/anxious.       Objective:    Physical Exam Vitals reviewed.  Constitutional:      General: She is not in acute distress.    Appearance: She is not diaphoretic.  HENT:     Head: Normocephalic.     Comments: Left parietal swelling, improving    Nose: No congestion.     Mouth/Throat:     Mouth: Mucous membranes are moist.  Eyes:     General: No  scleral icterus.    Extraocular Movements: Extraocular movements intact.  Neck:     Comments: Right-sided neck mass - thyroid  nodule, nontender, about 3 cm in  diameter Cardiovascular:     Rate and Rhythm: Normal rate and regular rhythm.     Heart sounds: Normal heart sounds. No murmur heard. Pulmonary:     Breath sounds: Normal breath sounds. No wheezing or rales.     Comments: On 3 LPM O2 Musculoskeletal:     Cervical back: Neck supple. No tenderness.     Right knee: Swelling (Mild) present. Normal range of motion. Tenderness present.     Right lower leg: No edema.     Left lower leg: No edema.  Skin:    General: Skin is warm.     Findings: No rash.  Neurological:     General: No focal deficit present.     Mental Status: She is alert and oriented to person, place, and time.     Sensory: Sensory deficit present.     Motor: Weakness (4/5 in b/l UE and LE) present.  Psychiatric:        Mood and Affect: Mood normal.        Behavior: Behavior normal.     BP 118/76   Pulse 87   Ht 5' 8 (1.727 m)   Wt 152 lb 4.8 oz (69.1 kg)   SpO2 97%   BMI 23.16 kg/m  Wt Readings from Last 3 Encounters:  06/11/24 152 lb 4.8 oz (69.1 kg)  05/24/24 154 lb 5.2 oz (70 kg)  04/23/24 154 lb (69.9 kg)    Lab Results  Component Value Date   TSH 6.820 (H) 01/23/2024   Lab Results  Component Value Date   WBC 5.4 05/24/2024   HGB 13.3 05/24/2024   HCT 41.6 05/24/2024   MCV 93.5 05/24/2024   PLT 252 05/24/2024   Lab Results  Component Value Date   NA 137 05/24/2024   K 4.2 05/24/2024   CO2 26 05/24/2024   GLUCOSE 79 05/24/2024   BUN 20 05/24/2024   CREATININE 0.95 05/24/2024   BILITOT 0.2 05/24/2024   ALKPHOS 74 05/24/2024   AST 42 (H) 05/24/2024   ALT 50 (H) 05/24/2024   PROT 7.5 05/24/2024   ALBUMIN 4.8 05/24/2024   CALCIUM  9.3 05/24/2024   ANIONGAP 12 05/24/2024   EGFR 59 (L) 04/19/2024   GFR 58.40 (L) 01/28/2021   Lab Results  Component Value Date   CHOL 202 (H)  01/23/2024   Lab Results  Component Value Date   HDL 35 (L) 01/23/2024   Lab Results  Component Value Date   LDLCALC 82 01/23/2024   Lab Results  Component Value Date   TRIG 529 (H) 01/23/2024   Lab Results  Component Value Date   CHOLHDL 5.8 (H) 01/23/2024   Lab Results  Component Value Date   HGBA1C 5.4 01/23/2024      Assessment & Plan:   Problem List Items Addressed This Visit       Cardiovascular and Mediastinum   (HFpEF) heart failure with preserved ejection fraction (HCC) - Primary   Had acute CHF exacerbation in the past Currently euvolemic On Jardiance  Followed by Cardiology        Respiratory   Chronic obstructive pulmonary disease (HCC)   Well controlled with Stiolto and as needed albuterol  Followed by Pulmonology Has 3 lpm home O2 for chronic hypoxia      Chronic respiratory failure with hypoxia (HCC)   Due to COPD Followed by Pulmonology Has 3 lpm home O2 for chronic hypoxia        Endocrine   Type 2 diabetes mellitus with neurological  complications [facility])   Lab Results  Component Value Date   HGBA1C 5.4 01/23/2024   Well controlled usually, had hyperglycemia around EGD due to not being able to take Mounjaro  On Mounjaro  5 mg qw Has had episodes of hypoglycemia (<50) in the past, has CGM now, no recent episode of hypoglycemia  On Jardiance  for HFpEF (did not tolerate Farxiga  10 mg) Advised to follow diabetic diet Has statin intolerance F/u CMP and lipid panel Diabetic eye exam: Advised to follow up with Ophthalmology for diabetic eye exam  Takes gabapentin  800 mg TID, she has noticed improvement in numbness and tingling of bilateral LE      Thyroid  nodule   Recent US  of thyroid  showed 3.6 cm thyroid  nodule TSH elevated recently Repeat TSH and free T4        Other   Hyperlipidemia   Advised to follow low carb diet 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  Check lipid profile, plan to start Repatha if LDL more than 55       Resting tremor   Resting tremor with bilateral hand weakness Has a history of recurrent falls Referred to neurology for further evaluation      Relevant Orders   Ambulatory referral to Neurology   Encounter for examination following treatment at hospital   ER chart reviewed, including imaging and blood tests Advised to avoid sudden positional changes and maintain at least 64 ounces of fluid intake in a day Referred to neurology due to resting tremors and recurrent falls        No orders of the defined types were placed in this encounter.   Follow-up: Return in about 4 months (around 10/09/2024) for DM.    Suzzane MARLA Blanch, MD

## 2024-06-11 NOTE — Assessment & Plan Note (Signed)
Due to COPD Followed by Pulmonology Has 3 lpm home O2 for chronic hypoxia 

## 2024-06-11 NOTE — Assessment & Plan Note (Addendum)
 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

## 2024-06-11 NOTE — Assessment & Plan Note (Signed)
Well controlled with Stiolto and as needed albuterol Followed by Pulmonology Has 3 lpm home O2 for chronic hypoxia

## 2024-06-11 NOTE — Patient Instructions (Signed)
Please continue to take medications as prescribed.  Please continue to follow low carb diet and ambulate as tolerated.  Please get fasting blood tests done within a week.

## 2024-06-11 NOTE — Assessment & Plan Note (Signed)
 Had acute CHF exacerbation in the past Currently euvolemic On Jardiance  Followed by Cardiology

## 2024-06-11 NOTE — Assessment & Plan Note (Signed)
 ER chart reviewed, including imaging and blood tests Advised to avoid sudden positional changes and maintain at least 64 ounces of fluid intake in a day Referred to neurology due to resting tremors and recurrent falls

## 2024-06-11 NOTE — Assessment & Plan Note (Signed)
 Lab Results  Component Value Date   HGBA1C 5.4 01/23/2024   Well controlled usually, had hyperglycemia around EGD due to not being able to take Mounjaro  On Mounjaro  5 mg qw Has had episodes of hypoglycemia (<50) in the past, has CGM now, no recent episode of hypoglycemia  On Jardiance  for HFpEF (did not tolerate Farxiga  10 mg) Advised to follow diabetic diet Has statin intolerance F/u CMP and lipid panel Diabetic eye exam: Advised to follow up with Ophthalmology for diabetic eye exam  Takes gabapentin  800 mg TID, she has noticed improvement in numbness and tingling of bilateral LE

## 2024-06-11 NOTE — Assessment & Plan Note (Signed)
 Recent US  of thyroid  showed 3.6 cm thyroid  nodule TSH elevated recently Repeat TSH and free T4

## 2024-06-13 ENCOUNTER — Ambulatory Visit: Admitting: Gastroenterology

## 2024-06-15 ENCOUNTER — Other Ambulatory Visit: Payer: Self-pay | Admitting: Internal Medicine

## 2024-06-15 DIAGNOSIS — E559 Vitamin D deficiency, unspecified: Secondary | ICD-10-CM

## 2024-06-17 ENCOUNTER — Other Ambulatory Visit: Payer: Self-pay | Admitting: Internal Medicine

## 2024-06-17 DIAGNOSIS — E1149 Type 2 diabetes mellitus with other diabetic neurological complication: Secondary | ICD-10-CM

## 2024-06-17 DIAGNOSIS — B009 Herpesviral infection, unspecified: Secondary | ICD-10-CM

## 2024-06-18 ENCOUNTER — Ambulatory Visit: Admitting: Gastroenterology

## 2024-06-19 ENCOUNTER — Encounter

## 2024-06-24 ENCOUNTER — Other Ambulatory Visit: Payer: Self-pay | Admitting: *Deleted

## 2024-06-24 NOTE — Patient Outreach (Addendum)
 Complex Care Management   Visit Note  06/24/2024  Name:  Kayla Ryan MRN: [id number] DOB: June 23, 1956  Situation: Referral received for Complex Care Management related to COPD and Diabetes with Complications I obtained verbal consent from Patient.  Visit completed with Patient  on the phone  Background:   Past Medical History:  Diagnosis Date   Allergy Unknown   Anemia    Anxiety 1883   Arthritis    Asthma 1980   CHF (congestive heart failure) (HCC) 2021   Chronic bronchitis    Crohn disease (HCC)    Emphysema    Emphysema of lung (HCC) 2003   Fibromyalgia    GERD (gastroesophageal reflux disease)    GI bleed    Herpes    History of blood transfusion    Hyperlipidemia    Hypertension 2015   IBS (irritable bowel syndrome)    Kidney failure    Migraines    Muscular dystrophy (HCC)    Neck pain    Oxygen  deficiency 2021   PAF (paroxysmal atrial fibrillation) (HCC)    Not anticoagulated with history of severe GI bleeding   Plantar fasciitis    Polymyositis (HCC)    PONV (postoperative nausea and vomiting)    Right thyroid  nodule 04/07/2022   Scoliosis    Type 2 diabetes mellitus (HCC)     Assessment: Patient Reported Symptoms:  Cognitive Cognitive Status: No symptoms reported Cognitive/Intellectual Conditions Management [RPT]: None reported or documented in medical history or problem list   Health Maintenance Behaviors: None Healing Pattern: Average Health Facilitated by: Rest  Neurological Neurological Review of Symptoms: No symptoms reported Neurological Management Strategies: Routine screening Neurological Self-Management Outcome: 4 (good)  HEENT HEENT Symptoms Reported: Tinnitus HEENT Management Strategies: Coping strategies, Adequate rest HEENT Self-Management Outcome: 4 (good)    Cardiovascular Cardiovascular Symptoms Reported: Swelling in legs or feet Does patient have uncontrolled Hypertension?: No Cardiovascular Management Strategies: Routine  screening Weight: 152 lb (68.9 kg) Cardiovascular Self-Management Outcome: 3 (uncertain)  Respiratory Respiratory Symptoms Reported: Dry cough, Shortness of breath Other Respiratory Symptoms: sob when lying Additional Respiratory Details: missed 2 doses of azithromycin  Respiratory Management Strategies: Medication therapy, Routine screening Respiratory Self-Management Outcome: 3 (uncertain)  Endocrine Endocrine Symptoms Reported: Hypoglycemia Is patient diabetic?: Yes Is patient checking blood sugars at home?: Yes List most recent blood sugar readings, include date and time of day: 101 Endocrine Self-Management Outcome: 4 (good)  Gastrointestinal Gastrointestinal Symptoms Reported: Distention Gastrointestinal Management Strategies: Adequate rest Gastrointestinal Self-Management Outcome: 3 (uncertain)    Genitourinary Genitourinary Symptoms Reported: No symptoms reported Genitourinary Self-Management Outcome: 4 (good)  Integumentary Integumentary Symptoms Reported: Other Other Integumentary Symptoms: abrasion Additional Integumentary Details: healing knee wound from fall Skin Management Strategies: Routine screening Skin Self-Management Outcome: 4 (good)  Musculoskeletal Musculoskelatal Symptoms Reviewed: Back pain Musculoskeletal Self-Management Outcome: 4 (good) Falls in the past year?: Yes Number of falls in past year: 1 or less Was there an injury with Fall?: Yes Fall Risk Category Calculator: 2 Patient Fall Risk Level: Moderate Fall Risk Patient at Risk for Falls Due to: History of fall(s), Impaired mobility Fall risk Follow up: Falls evaluation completed  Psychosocial Psychosocial Symptoms Reported: No symptoms reported Behavioral Health Self-Management Outcome: 4 (good) Major Change/Loss/Stressor/Fears (CP): Denies Techniques to Cope with Loss/Stress/Change: None Do you feel physically threatened by others?: No    06/24/2024    PHQ2-9 Depression Screening   Little  interest or pleasure in doing things Not at all  Feeling down, depressed, or hopeless Not  at all  PHQ-2 - Total Score 0  Trouble falling or staying asleep, or sleeping too much    Feeling tired or having little energy    Poor appetite or overeating     Feeling bad about yourself - or that you are a failure or have let yourself or your family down    Trouble concentrating on things, such as reading the newspaper or watching television    Moving or speaking so slowly that other people could have noticed.  Or the opposite - being so fidgety or restless that you have been moving around a lot more than usual    Thoughts that you would be better off dead, or hurting yourself in some way    PHQ2-9 Total Score    If you checked off any problems, how difficult have these problems made it for you to do your work, take care of things at home, or get along with other people    Depression Interventions/Treatment      Today's Vitals   06/24/24 1104  Weight: 152 lb (68.9 kg)   Pain Scale: 0-10 Pain Score: 5  Pain Type: Chronic pain Pain Location: Foot Pain Orientation: Left Pain Descriptors / Indicators: Aching Pain Intervention(s): Medication (See eMAR)  Medications Reviewed Today     Reviewed by Nivia, Fraser Busche , RN (Registered Nurse) on 06/24/24 at 1101  Med List Status: <None>   Medication Order Taking? Sig Documenting Provider Last Dose Status Informant  Accu-Chek Softclix Lancets lancets [id number] Yes USE TO TEST BLOOD GLUCOSE THREE TIMES DAILY Tobie Suzzane POUR, MD  Active   acyclovir  (ZOVIRAX ) 400 MG tablet [id number] Yes TAKE 1 TABLET(400 MG) BY MOUTH THREE TIMES DAILY Tobie Suzzane POUR, MD  Active   albuterol  (PROVENTIL ) (2.5 MG/3ML) 0.083% nebulizer solution [id number]  Take 3 mLs (2.5 mg total) by nebulization every 6 (six) hours as needed for wheezing or shortness of breath.  Patient not taking: Reported on 06/24/2024   Orlie Madelin RAMAN, NP  Expired 06/11/24 2359 Self  Alcohol  Swabs  PADS  [id number]  Use it as directed before blood glucose check 3 times daily. Tobie Suzzane POUR, MD  Active   ASPERCREME LIDOCAINE  EX [id number] Yes Apply 1 Application topically 3 (three) times daily as needed (pain). [provider]  Active Self  Blood Pressure Monitoring (BLOOD PRESSURE MONITOR 3) DEVI [id number] Yes Check blood pressure once daily 1 hour after taking blood pressure medication Tobie Suzzane POUR, MD  Active   cetirizine  (ZYRTEC ) 10 MG tablet [id number] Yes Take 10 mg by mouth 2 (two) times daily. [provider]  Active Self  Continuous Glucose Receiver (DEXCOM G7 RECEIVER) ESPIRIDION [id number] Yes USE TO CHECK BLOOD GLUCOSE AS NEEDED Tobie Suzzane POUR, MD  Active Self  Continuous Glucose Sensor (DEXCOM G7 SENSOR) MISC [id number] Yes USE TO CHECK BLOOD SUGAR THREE TIMES DAILY, BEFORE MEALS AND AT BEDTIME AND AS NEEDED. CHANGE SENSOR EVERY 10 DAYS Tobie Suzzane POUR, MD  Active   dicyclomine  (BENTYL ) 10 MG capsule [id number] Yes Take 1 capsule (10 mg total) by mouth 4 (four) times daily -  before meals and at bedtime. Monitor for constipation, dry mouth, dizziness Shirlean Therisa ORN, NP  Active   diphenhydrAMINE  (BENADRYL ) 25 MG tablet [id number] Yes Take 50 mg by mouth daily as needed for allergies or itching. [provider]  Active Self  doxepin  (SINEQUAN ) 10 MG capsule [id number] Yes TAKE 1 CAPSULE(10 MG) BY MOUTH AT BEDTIME Tobie Suzzane POUR, MD  Active   DULoxetine  (CYMBALTA )  60 MG capsule [id number] Yes TAKE 1 CAPSULE(60 MG) BY MOUTH TWICE DAILY Patel, Rutwik K, MD  Active   ezetimibe  (ZETIA ) 10 MG tablet [id number]  Take 1 tablet (10 mg total) by mouth daily. Debera Jayson MATSU, MD  Expired 06/11/24 2359   gabapentin  (NEURONTIN ) 800 MG tablet [id number] Yes Take 1 tablet (800 mg total) by mouth 3 (three) times daily. TAKE 1 TABLET(800 MG) BY MOUTH THREE TIMES DAILY Patel, Rutwik K, MD  Active   Glucagon  (GVOKE HYPOPEN  2-PACK) 1 MG/0.2ML SOAJ [id number] Yes Inject 0.2 mLs into the skin as  needed (Blood glucose < 53). Tobie Suzzane POUR, MD  Active Self  glucose blood (ACCU-CHEK GUIDE TEST) test strip [id number] Yes Use as instructed 3 times daily. Tobie Suzzane POUR, MD  Active   Glycerin-Hypromellose-PEG 400 (DRY EYE RELIEF DROPS OP) [id number] Yes Place 1 drop into both eyes 3 (three) times daily as needed (Dry eye). [provider]  Active Self  HYDROcodone -acetaminophen  (NORCO) 10-325 MG tablet [id number] Yes Take 1 tablet by mouth every 4 (four) hours as needed. Stop Hydrocodone  7.5/325mg  tablet.   Active Self  ipratropium (ATROVENT  HFA) 17 MCG/ACT inhaler [id number] Yes Inhale 2 puffs into the lungs every 4 (four) hours as needed (COPD). Byrum, Robert S, MD  Active Self  isosorbide  dinitrate (ISORDIL ) 30 MG tablet [id number] Yes Take 1 tablet (30 mg total) by mouth daily. Tobie Suzzane POUR, MD  Active   JARDIANCE  10 MG TABS tablet [id number] Yes TAKE 1 TABLET(10 MG) BY MOUTH DAILY BEFORE BREAKFAST Patel, Rutwik K, MD  Active   Lancets Misc. (ACCU-CHEK SOFTCLIX LANCET DEV) KIT [id number] Yes USE TO CHECK GLUCOSE THREE TIMES DAILY Tobie Suzzane POUR, MD  Active Self  LORazepam  (ATIVAN ) 0.5 MG tablet [id number] Yes TAKE 1 TABLET(0.5 MG) BY MOUTH THREE TIMES DAILY AS NEEDED FOR ANXIETY Patel, Rutwik K, MD  Active   Magnesium  Hydroxide (MILK OF MAGNESIA PO) [id number] Yes Take by mouth. Takes as needed [provider]  Active   meclizine  (ANTIVERT ) 25 MG tablet [id number] Yes TAKE 1 TABLET(25 MG) BY MOUTH THREE TIMES DAILY AS NEEDED FOR DIZZINESS Patel, Rutwik K, MD  Active   methocarbamol  (ROBAXIN ) 500 MG tablet [id number] Yes Take 1 tablet (500 mg total) by mouth every 8 (eight) hours as needed for muscle spasms. Tobie Suzzane POUR, MD  Active   Misc. Devices KIT [id number]  Henmnii rollator walker Tobie Suzzane POUR, MD  Active Self  montelukast  (SINGULAIR ) 10 MG tablet [id number] Yes TAKE 1 TABLET(10 MG) BY MOUTH AT BEDTIME Shelah Lamar RAMAN, MD  Active   MOUNJARO  5 MG/0.5ML Pen [id number] Yes  ADMINISTER 5 MG UNDER THE SKIN 1 TIME A WEEK Tobie, Suzzane POUR, MD  Active   nitroGLYCERIN  (NITROSTAT ) 0.4 MG SL tablet [id number]  Place 1 tablet (0.4 mg total) under the tongue every 5 (five) minutes as needed for chest pain. Debera Jayson MATSU, MD  Expired 06/11/24 2359 Self  omeprazole  (PRILOSEC) 40 MG capsule [id number] Yes Take 1 capsule (40 mg total) by mouth 2 (two) times daily. Lamon Herter M, PA-C  Active   ondansetron  (ZOFRAN -ODT) 8 MG disintegrating tablet [id number] Yes DISSOLVE 1 TABLET EVERY 8 HOURS AS NEEDED FOR FOR NAUSEA AND VOMITING Rogers Hai, MD  Active   STIOLTO RESPIMAT  2.5-2.5 MCG/ACT AERS [id number] Yes INHALE 2 PUFFS INTO THE LUNGS DAILY Tobie Suzzane POUR, MD  Active Self  Vitamin D , Ergocalciferol , (DRISDOL ) 1.25 MG (50000 UNIT) CAPS capsule [id number]  TAKE 1 CAPSULE BY MOUTH EVERY 7  DAYS Tobie Suzzane POUR, MD  Active   XIFAXAN  550 MG TABS tablet [id number]  TAKE 1 TABLET(550 MG) BY MOUTH THREE TIMES DAILY FOR 14 DAYS  Patient not taking: Reported on 06/24/2024   Shirlean Therisa ORN, NP  Active             Recommendation:   Continue Current Plan of Care  Follow Up Plan:   Telephone follow-up in 1 month  Lanell Carpenter RN RN Care Manager [facility] [phone]

## 2024-06-24 NOTE — Patient Instructions (Signed)
 Visit Information  Thank you for taking time to visit with me today. Please don't hesitate to contact me if I can be of assistance to you before our next scheduled appointment.  Your next care management appointment is by telephone on 07/22/24 at 10::30  Telephone follow-up in 1 month  Please call the care guide team at [phone] if you need to cancel, schedule, or reschedule an appointment.   Please call the Suicide and Crisis Lifeline: 988 call the [place]  National Suicide Prevention Lifeline: 423 786 3753 or TTY: (214)831-3577 TTY (606)726-0343) to talk to a trained counselor call 1-800-273-TALK (toll free, 24 hour hotline) if you are experiencing a Mental Health or Behavioral Health Crisis or need someone to talk to.  Altin Sease RN RN Care Manager [facility] [phone]

## 2024-06-25 ENCOUNTER — Other Ambulatory Visit: Payer: Self-pay | Admitting: Internal Medicine

## 2024-06-25 DIAGNOSIS — F411 Generalized anxiety disorder: Secondary | ICD-10-CM

## 2024-06-26 ENCOUNTER — Ambulatory Visit
Admission: RE | Admit: 2024-06-26 | Discharge: 2024-06-26 | Disposition: A | Discharge status: Home / Self Care | Source: Ambulatory Visit | Attending: Internal Medicine | Admitting: Internal Medicine

## 2024-06-26 ENCOUNTER — Encounter: Payer: Self-pay | Admitting: Oncology

## 2024-06-26 DIAGNOSIS — R928 Other abnormal and inconclusive findings on diagnostic imaging of breast: Secondary | ICD-10-CM

## 2024-06-26 HISTORY — PX: BREAST BIOPSY: SHX20

## 2024-06-27 ENCOUNTER — Ambulatory Visit: Payer: Self-pay | Admitting: Gastroenterology

## 2024-06-27 ENCOUNTER — Other Ambulatory Visit: Payer: Self-pay | Admitting: Internal Medicine

## 2024-06-27 DIAGNOSIS — J439 Emphysema, unspecified: Secondary | ICD-10-CM

## 2024-06-27 LAB — SURGICAL PATHOLOGY

## 2024-07-07 ENCOUNTER — Other Ambulatory Visit: Payer: Self-pay | Admitting: Internal Medicine

## 2024-07-07 DIAGNOSIS — R42 Dizziness and giddiness: Secondary | ICD-10-CM

## 2024-07-07 DIAGNOSIS — M5030 Other cervical disc degeneration, unspecified cervical region: Secondary | ICD-10-CM

## 2024-07-15 ENCOUNTER — Other Ambulatory Visit

## 2024-07-17 ENCOUNTER — Inpatient Hospital Stay
Admission: RE | Admit: 2024-07-17 | Discharge: 2024-07-17 | Attending: Emergency Medicine | Admitting: Emergency Medicine

## 2024-07-17 DIAGNOSIS — R918 Other nonspecific abnormal finding of lung field: Secondary | ICD-10-CM

## 2024-07-18 ENCOUNTER — Encounter: Payer: Self-pay | Admitting: Emergency Medicine

## 2024-07-18 ENCOUNTER — Ambulatory Visit: Admitting: Emergency Medicine

## 2024-07-18 DIAGNOSIS — J309 Allergic rhinitis, unspecified: Secondary | ICD-10-CM

## 2024-07-18 DIAGNOSIS — R918 Other nonspecific abnormal finding of lung field: Secondary | ICD-10-CM

## 2024-07-18 DIAGNOSIS — I259 Chronic ischemic heart disease, unspecified: Secondary | ICD-10-CM

## 2024-07-18 DIAGNOSIS — Z87891 Personal history of nicotine dependence: Secondary | ICD-10-CM | POA: Diagnosis not present

## 2024-07-18 DIAGNOSIS — I209 Angina pectoris, unspecified: Secondary | ICD-10-CM

## 2024-07-18 DIAGNOSIS — K219 Gastro-esophageal reflux disease without esophagitis: Secondary | ICD-10-CM

## 2024-07-18 DIAGNOSIS — J449 Chronic obstructive pulmonary disease, unspecified: Secondary | ICD-10-CM | POA: Diagnosis not present

## 2024-07-18 DIAGNOSIS — J9611 Chronic respiratory failure with hypoxia: Secondary | ICD-10-CM

## 2024-07-18 NOTE — Assessment & Plan Note (Signed)
 Please continue Stiolto 2 puffs once daily. You can use your Atrovent  inhaler 2 puffs up to every 6 hours if needed for shortness of breath You can use your albuterol  nebulizer treatment up to every 4 hours if needed for shortness of breath Flu shot is up-to-date Follow Dr. Shelah in 6 months.  Please call sooner if you have any respiratory problems or questions.

## 2024-07-18 NOTE — Assessment & Plan Note (Signed)
Continue Zyrtec and Singulair as you have been taking them.

## 2024-07-18 NOTE — Assessment & Plan Note (Signed)
 Continue omeprazole  as you have been taking

## 2024-07-18 NOTE — Assessment & Plan Note (Signed)
Continue your oxygen at all times as you have been using it. 

## 2024-07-18 NOTE — Progress Notes (Signed)
 Subjective:    Patient ID: Kayla Ryan, female    DOB: Jun 21, 1956, 68 y.o.   MRN: [id number]  COPD Her past medical history is significant for COPD.    ROV 07/18/2024 --follow-up visit for pleasant 68 year old woman with severe COPD and a positive bronchodilator response, asthmatic features.  She also has chronic cough, rhinitis, GERD and micronodular disease on CT chest most consistent with bronchiolitis RB-ILD. Managed on Stiolto, Atrovent  as needed, albuterol  nebs as needed, Zyrtec , Singulair , omeprazole  40 mg daily. She has exertional SOB, is quite inactive due to breathing and also her arthritis. She feels more weak, has sweats, more SOB w any activity. She has had CP w exertion that has responded to NTG - has used 4 time in the last week. The referral to [facility] for a lighter POC didn't work out. Last visit I started her on scheduled azithro, but didn;t make it clear that I wanted her to stay on it so she took for 1 month.   CT chest 07/17/2024 reviewed by me showed some improvement in areas of tree-in-bud nodularity in the right lower lobe and right middle lobe compared with 07/2023.  Review of Systems As per HPI  Past Medical History:  Diagnosis Date   Allergy Unknown   Anemia    Anxiety 1883   Arthritis    Asthma 1980   CHF (congestive heart failure) (HCC) 2021   Chronic bronchitis    Crohn disease (HCC)    Emphysema    Emphysema of lung (HCC) 2003   Fibromyalgia    GERD (gastroesophageal reflux disease)    GI bleed    Herpes    History of blood transfusion    Hyperlipidemia    Hypertension 2015   IBS (irritable bowel syndrome)    Kidney failure    Migraines    Muscular dystrophy (HCC)    Neck pain    Oxygen  deficiency 2021   PAF (paroxysmal atrial fibrillation) (HCC)    Not anticoagulated with history of severe GI bleeding   Plantar fasciitis    Polymyositis (HCC)    PONV (postoperative nausea and vomiting)    Right thyroid  nodule 04/07/2022   Scoliosis     Type 2 diabetes mellitus (HCC)      Family History  Problem Relation Age of Onset   Lung cancer Mother    Cancer Mother    Miscarriages / Stillbirths Mother    Varicose Veins Mother    Hypertension Mother    Anxiety disorder Mother    Depression Mother    COPD Father    Lung cancer Father    Lymphoma Father    Alcohol  abuse Father    Arthritis Father    Cancer Father    Anxiety disorder Sister    Anxiety disorder Sister    Intellectual disability Sister    Depression Brother    Inflammatory bowel disease Neg Hx    Colon cancer Neg Hx      Social History   Socioeconomic History   Marital status: Widowed    Spouse name: Not on file   Number of children: Not on file   Years of education: Not on file   Highest education level: Some college, no degree  Occupational History   Not on file  Tobacco Use   Smoking status: Former    Current packs/day: 0.00    Average packs/day: 1 pack/day for 49.0 years (49.0 ttl pk-yrs)    Types: Cigarettes    Start date:  08/08/1966    Quit date: 08/31/2005    Years since quitting: 18.8   Smokeless tobacco: Never   Tobacco comments:    It has been 16 years since I smoked a cigarette!!!  Vaping Use   Vaping status: Never Used  Substance and Sexual Activity   Alcohol  use: No   Drug use: No    Comment: used marijuana in teens   Sexual activity: Not Currently    Birth control/protection: Abstinence, Surgical  Other Topics Concern   Not on file  Social History Narrative   Are you right handed or left handed? Left Handed    Are you currently employed ? Retired    What is your current occupation?   Do you live at home alone? Yes   Who lives with you?    What type of home do you live in: 1 story or 2 story? Lives in a one story home        Social Drivers of Health   Tobacco Use: Medium Risk (07/18/2024)   Patient History    Smoking Tobacco Use: Former    Smokeless Tobacco Use: Never    Passive Exposure: Not on file  Financial  Resource Strain: High Risk (06/07/2024)   Overall Financial Resource Strain (CARDIA)    Difficulty of Paying Living Expenses: Hard  Food Insecurity: Food Insecurity Present (06/07/2024)   Epic    Worried About Programme Researcher, Broadcasting/film/video in the Last Year: Often true    Ran Out of Food in the Last Year: Often true  Transportation Needs: No Transportation Needs (06/07/2024)   Epic    Lack of Transportation (Medical): No    Lack of Transportation (Non-Medical): No  Physical Activity: Insufficiently Active (06/07/2024)   Exercise Vital Sign    Days of Exercise per Week: 3 days    Minutes of Exercise per Session: 30 min  Stress: Stress Concern Present (06/07/2024)   [employer] of Occupational Health - Occupational Stress Questionnaire    Feeling of Stress: To some extent  Social Connections: Unknown (06/07/2024)   Social Connection and Isolation Panel    Frequency of Communication with Friends and Family: More than three times a week    Frequency of Social Gatherings with Friends and Family: Never    Attends Religious Services: More than 4 times per year    Active Member of [employer] or Organizations: Patient declined    Attends Banker Meetings: Not on file    Marital Status: Widowed  Intimate Partner Violence: Not At Risk (04/23/2024)   Epic    Fear of Current or Ex-Partner: No    Emotionally Abused: No    Physically Abused: No    Sexually Abused: No  Depression (PHQ2-9): Low Risk (06/24/2024)   Depression (PHQ2-9)    PHQ-2 Score: 0  Alcohol  Screen: Low Risk (04/23/2024)   Alcohol  Screen    Last Alcohol  Screening Score (AUDIT): 0  Housing: Low Risk (06/07/2024)   Epic    Unable to Pay for Housing in the Last Year: No    Number of Times Moved in the Last Year: 0    Homeless in the Last Year: No  Utilities: Not At Risk (04/23/2024)   Epic    Threatened with loss of utilities: No  Health Literacy: Adequate Health Literacy (04/23/2024)   B1300 Health Literacy    Frequency  of need for help with medical instructions: Never    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  From  [place], lived in [place], [place], [place].   Allergies  Allergen Reactions   Sulfa Antibiotics Shortness Of Breath, Swelling and Rash   Chicken Allergy Diarrhea and Nausea And Vomiting   Clindamycin /Lincomycin Itching and Nausea And Vomiting   Dilaudid  [Hydromorphone  Hcl]     Makes me crazy   Ferrlecit [Na Ferric Gluc Cplx In Sucrose] Itching    Itching and throat tightness   Iron      Throat starts closing up, tongue swells, severe headaches and backpain   Metronidazole Diarrhea and Nausea And Vomiting   Other     Green peas - itching and nauea   Prednisone  Other (See Comments)    Angry     Statins Other (See Comments)    Leg pain   Tuna Oil [Fish Oil] Diarrhea and Nausea And Vomiting   Budesonide  Nausea And Vomiting and Rash    Nebulizing solution    Cephalexin Diarrhea and Nausea And Vomiting   Methotrexate And Trimetrexate Swelling and Rash   Morphine  And Codeine Itching and Anxiety    exreme mood swings   Tomato Itching and Rash     Outpatient Medications Prior to Visit  Medication Sig Dispense Refill   Accu-Chek Softclix Lancets lancets USE TO TEST BLOOD GLUCOSE THREE TIMES DAILY 100 each 0   acyclovir  (ZOVIRAX ) 400 MG tablet TAKE 1 TABLET(400 MG) BY MOUTH THREE TIMES DAILY 90 tablet 3   albuterol  (PROVENTIL ) (2.5 MG/3ML) 0.083% nebulizer solution Take 3 mLs (2.5 mg total) by nebulization every 6 (six) hours as needed for wheezing or shortness of breath. 75 mL 5   Alcohol  Swabs  PADS Use it as directed before blood glucose check 3 times daily. 100 each 3   ASPERCREME LIDOCAINE  EX Apply 1 Application topically 3 (three) times daily as needed (pain).     Blood Pressure Monitoring (BLOOD PRESSURE MONITOR 3) DEVI Check blood pressure once daily 1 hour after taking blood pressure medication 1 each 0   cetirizine  (ZYRTEC ) 10 MG tablet Take 10 mg by mouth 2 (two) times daily.      Continuous Glucose Receiver (DEXCOM G7 RECEIVER) DEVI USE TO CHECK BLOOD GLUCOSE AS NEEDED 1 each 0   Continuous Glucose Sensor (DEXCOM G7 SENSOR) MISC USE TO CHECK BLOOD SUGAR THREE TIMES DAILY, BEFORE MEALS AND AT BEDTIME AND AS NEEDED. CHANGE SENSOR EVERY 10 DAYS 3 each 5   dicyclomine  (BENTYL ) 10 MG capsule Take 1 capsule (10 mg total) by mouth 4 (four) times daily -  before meals and at bedtime. Monitor for constipation, dry mouth, dizziness 120 capsule 3   diphenhydrAMINE  (BENADRYL ) 25 MG tablet Take 50 mg by mouth daily as needed for allergies or itching.     doxepin  (SINEQUAN ) 10 MG capsule TAKE 1 CAPSULE(10 MG) BY MOUTH AT BEDTIME 30 capsule 3   DULoxetine  (CYMBALTA ) 60 MG capsule TAKE 1 CAPSULE(60 MG) BY MOUTH TWICE DAILY 60 capsule 3   ezetimibe  (ZETIA ) 10 MG tablet Take 1 tablet (10 mg total) by mouth daily. 90 tablet 3   gabapentin  (NEURONTIN ) 800 MG tablet Take 1 tablet (800 mg total) by mouth 3 (three) times daily. TAKE 1 TABLET(800 MG) BY MOUTH THREE TIMES DAILY 90 tablet 5   Glucagon  (GVOKE HYPOPEN  2-PACK) 1 MG/0.2ML SOAJ Inject 0.2 mLs into the skin as needed (Blood glucose < 53). 0.4 mL 5   glucose blood (ACCU-CHEK GUIDE TEST) test strip Use as instructed 3 times daily. 100 each 12   Glycerin-Hypromellose-PEG 400 (DRY EYE RELIEF DROPS OP) Place 1  drop into both eyes 3 (three) times daily as needed (Dry eye).     HYDROcodone -acetaminophen  (NORCO) 10-325 MG tablet Take 1 tablet by mouth every 4 (four) hours as needed. Stop Hydrocodone  7.5/325mg  tablet. 180 tablet 0   ipratropium (ATROVENT  HFA) 17 MCG/ACT inhaler Inhale 2 puffs into the lungs every 4 (four) hours as needed (COPD). 1 each 4   isosorbide  dinitrate (ISORDIL ) 30 MG tablet Take 1 tablet (30 mg total) by mouth daily. 90 tablet 1   JARDIANCE  10 MG TABS tablet TAKE 1 TABLET(10 MG) BY MOUTH DAILY BEFORE BREAKFAST 30 tablet 5   Lancets Misc. (ACCU-CHEK SOFTCLIX LANCET DEV) KIT USE TO CHECK GLUCOSE THREE TIMES DAILY 1 kit 0    LORazepam  (ATIVAN ) 0.5 MG tablet TAKE 1 TABLET(0.5 MG) BY MOUTH THREE TIMES DAILY AS NEEDED FOR ANXIETY 90 tablet 3   Magnesium  Hydroxide (MILK OF MAGNESIA PO) Take by mouth. Takes as needed     meclizine  (ANTIVERT ) 25 MG tablet TAKE 1 TABLET(25 MG) BY MOUTH THREE TIMES DAILY AS NEEDED FOR DIZZINESS 30 tablet 1   methocarbamol  (ROBAXIN ) 500 MG tablet TAKE 1 TABLET(500 MG) BY MOUTH EVERY 8 HOURS AS NEEDED FOR MUSCLE SPASMS 30 tablet 2   Misc. Devices KIT Henmnii rollator walker 1 kit 0   montelukast  (SINGULAIR ) 10 MG tablet TAKE 1 TABLET(10 MG) BY MOUTH AT BEDTIME 90 tablet 3   MOUNJARO  5 MG/0.5ML Pen ADMINISTER 5 MG UNDER THE SKIN 1 TIME A WEEK 6 mL 1   nitroGLYCERIN  (NITROSTAT ) 0.4 MG SL tablet Place 1 tablet (0.4 mg total) under the tongue every 5 (five) minutes as needed for chest pain. 90 tablet 3   omeprazole  (PRILOSEC) 40 MG capsule Take 1 capsule (40 mg total) by mouth 2 (two) times daily. 180 capsule 3   ondansetron  (ZOFRAN -ODT) 8 MG disintegrating tablet DISSOLVE 1 TABLET EVERY 8 HOURS AS NEEDED FOR FOR NAUSEA AND VOMITING 257 tablet 1   STIOLTO RESPIMAT  2.5-2.5 MCG/ACT AERS INHALE 2 PUFFS INTO THE LUNGS DAILY 4 g 11   Vitamin D , Ergocalciferol , (DRISDOL ) 1.25 MG (50000 UNIT) CAPS capsule TAKE 1 CAPSULE BY MOUTH EVERY 7 DAYS 12 capsule 1   XIFAXAN  550 MG TABS tablet TAKE 1 TABLET(550 MG) BY MOUTH THREE TIMES DAILY FOR 14 DAYS 42 tablet 0   No facility-administered medications prior to visit.        Objective:   Physical Exam Vitals:   07/18/24 1537  BP: (!) 120/56  Pulse: 84  Temp: 98.1 F (36.7 C)  TempSrc: Oral  SpO2: 98%  Weight: 152 lb (68.9 kg)  Height: 5' 8 (1.727 m)    Gen: Pleasant, well-nourished, in no distress,  normal affect  ENT: No lesions,  mouth clear,  oropharynx clear, no postnasal drip  Neck: No JVD, no stridor  Lungs: No use of accessory muscles, no crackles or wheezing on normal respiration, no wheeze  Cardiovascular: RRR, heart sounds normal,  no murmur or gallops, no peripheral edema  Musculoskeletal: No deformities, no cyanosis or clubbing  Neuro: alert, awake, non focal  Skin: Warm, no lesions or rash     Assessment & Plan:  Pulmonary nodules Micronodular disease consistent with bronchiolitis is improved on the CT chest done 07/17/2024.  We do not need to schedule follow-up at this time, can follow her clinically and decide if indicated.  Angina pectoris She is having exertional chest pain that has been relieved by nitroglycerin , weakness, fatigue.  She follows with Dr. Debera in [place].  Will  work on getting her an office visit urgently to discuss the symptoms and determine appropriate workup.  Chronic obstructive pulmonary disease (HCC) Please continue Stiolto 2 puffs once daily. You can use your Atrovent  inhaler 2 puffs up to every 6 hours if needed for shortness of breath You can use your albuterol  nebulizer treatment up to every 4 hours if needed for shortness of breath Flu shot is up-to-date Follow Dr. Shelah in 6 months.  Please call sooner if you have any respiratory problems or questions.   Chronic respiratory failure with hypoxia (HCC) Continue your oxygen  at all times as you have been using it   Allergic rhinitis Continue Zyrtec  and Singulair  as you have been taking them  Gastroesophageal reflux disease without esophagitis Continue omeprazole  as you have been taking  I personally spent a total of 30 minutes in the care of the patient today including preparing to see the patient, getting/reviewing separately obtained history, performing a medically appropriate exam/evaluation, counseling and educating, placing orders, referring and communicating with other health care professionals, documenting clinical information in the EHR, independently interpreting results, and communicating results.    Lamar Shelah, MD, PhD 07/18/2024, 3:56 PM [facility] [phone] or if no answer  [phone]

## 2024-07-18 NOTE — Assessment & Plan Note (Signed)
 She is having exertional chest pain that has been relieved by nitroglycerin , weakness, fatigue.  She follows with Dr. Debera in [place].  Will work on getting her an office visit urgently to discuss the symptoms and determine appropriate workup.

## 2024-07-18 NOTE — Addendum Note (Signed)
 Addended by: Arrington Yohe M on: 07/18/2024 04:12 PM   Modules accepted: Orders

## 2024-07-18 NOTE — Patient Instructions (Addendum)
 We need to get you into see Dr. Debera and Cardiology in [place] to discuss your chest discomfort and your nitroglycerin  use Please continue Stiolto 2 puffs once daily. You can use your Atrovent  inhaler 2 puffs up to every 6 hours if needed for shortness of breath You can use your albuterol  nebulizer treatment up to every 4 hours if needed for shortness of breath Flu shot is up-to-date Continue your oxygen  at all times as you have been using it Continue Zyrtec  and Singulair  as you have been taking them Continue omeprazole  as you have been taking Follow Dr. Shelah in 6 months.  Please call sooner if you have any respiratory problems or questions.

## 2024-07-18 NOTE — Assessment & Plan Note (Signed)
 Micronodular disease consistent with bronchiolitis is improved on the CT chest done 07/17/2024.  We do not need to schedule follow-up at this time, can follow her clinically and decide if indicated.

## 2024-07-22 ENCOUNTER — Other Ambulatory Visit (INDEPENDENT_AMBULATORY_CARE_PROVIDER_SITE_OTHER): Payer: Self-pay | Admitting: *Deleted

## 2024-07-22 NOTE — Patient Outreach (Signed)
 Complex Care Management   Visit Note  07/22/2024  Name:  Kayla Ryan MRN: [id number] DOB: 1956/05/06  Situation: Referral received for Complex Care Management related to COPD and HLD I obtained verbal consent from Patient.  Visit completed with Patient  on the phone  Background:   Past Medical History:  Diagnosis Date   Allergy Unknown   Anemia    Anxiety 1883   Arthritis    Asthma 1980   CHF (congestive heart failure) (HCC) 2021   Chronic bronchitis    Crohn disease (HCC)    Emphysema    Emphysema of lung (HCC) 2003   Fibromyalgia    GERD (gastroesophageal reflux disease)    GI bleed    Herpes    History of blood transfusion    Hyperlipidemia    Hypertension 2015   IBS (irritable bowel syndrome)    Kidney failure    Migraines    Muscular dystrophy (HCC)    Neck pain    Oxygen  deficiency 2021   PAF (paroxysmal atrial fibrillation) (HCC)    Not anticoagulated with history of severe GI bleeding   Plantar fasciitis    Polymyositis (HCC)    PONV (postoperative nausea and vomiting)    Right thyroid  nodule 04/07/2022   Scoliosis    Type 2 diabetes mellitus (HCC)     Assessment: Patient Reported Symptoms:  Cognitive Cognitive Status: No symptoms reported Cognitive/Intellectual Conditions Management [RPT]: None reported or documented in medical history or problem list   Health Maintenance Behaviors: Annual physical exam Healing Pattern: Average Health Facilitated by: Rest  Neurological Neurological Review of Symptoms: Vision changes Neurological Management Strategies: Routine screening Neurological Self-Management Outcome: 3 (uncertain)  HEENT HEENT Symptoms Reported: Nasal discharge, Tearing HEENT Management Strategies: Routine screening HEENT Self-Management Outcome: 4 (good)    Cardiovascular Cardiovascular Symptoms Reported: No symptoms reported Does patient have uncontrolled Hypertension?: No Cardiovascular Management Strategies: Routine  screening Cardiovascular Self-Management Outcome: 4 (good) Cardiovascular Comment: Chest pain  noted 5 times since before thanksgiving. None reported today and has follow up with cardiology  Respiratory Respiratory Symptoms Reported: Chest tightness, Shortness of breath Respiratory Management Strategies: Oxygen  therapy, Coping strategies Respiratory Self-Management Outcome: 3 (uncertain)  Endocrine Endocrine Symptoms Reported: No symptoms reported Is patient diabetic?: Yes Is patient checking blood sugars at home?: Yes List most recent blood sugar readings, include date and time of day: 07-22-2024 at 1000 105 Endocrine Self-Management Outcome: 4 (good)  Gastrointestinal Gastrointestinal Symptoms Reported: Diarrhea, Nausea Gastrointestinal Self-Management Outcome: 3 (uncertain)    Genitourinary Genitourinary Symptoms Reported: Itching or irritation Genitourinary Self-Management Outcome: 3 (uncertain) Genitourinary Comment: calling for yeast infection treatment  Integumentary Integumentary Symptoms Reported: No symptoms reported Skin Management Strategies: Routine screening Skin Self-Management Outcome: 4 (good)  Musculoskeletal Musculoskelatal Symptoms Reviewed: Back pain, Joint pain Musculoskeletal Management Strategies: Routine screening Musculoskeletal Self-Management Outcome: 4 (good) Falls in the past year?: Yes Number of falls in past year: 1 or less Was there an injury with Fall?: Yes Fall Risk Category Calculator: 2 Patient Fall Risk Level: Moderate Fall Risk Patient at Risk for Falls Due to: History of fall(s) Fall risk Follow up: Falls evaluation completed  Psychosocial Psychosocial Symptoms Reported: No symptoms reported Behavioral Management Strategies: Coping strategies Behavioral Health Self-Management Outcome: 4 (good) Major Change/Loss/Stressor/Fears (CP): Denies Techniques to Cope with Loss/Stress/Change: None      07/22/2024    PHQ2-9 Depression Screening    Little interest or pleasure in doing things Not at all  Feeling down, depressed, or hopeless Not  at all  PHQ-2 - Total Score 0  Trouble falling or staying asleep, or sleeping too much    Feeling tired or having little energy    Poor appetite or overeating     Feeling bad about yourself - or that you are a failure or have let yourself or your family down    Trouble concentrating on things, such as reading the newspaper or watching television    Moving or speaking so slowly that other people could have noticed.  Or the opposite - being so fidgety or restless that you have been moving around a lot more than usual    Thoughts that you would be better off dead, or hurting yourself in some way    PHQ2-9 Total Score    If you checked off any problems, how difficult have these problems made it for you to do your work, take care of things at home, or get along with other people    Depression Interventions/Treatment      There were no vitals filed for this visit. Pain Score: 7  Pain Type: Chronic pain Pain Location: Foot Pain Orientation: Right, Left Pain Descriptors / Indicators: Burning  Medications Reviewed Today     Reviewed by Bertrum Rosina HERO, RN (Registered Nurse) on 07/22/24 at 1044  Med List Status: <None>   Medication Order Taking? Sig Documenting Provider Last Dose Status Informant  Accu-Chek Softclix Lancets lancets [id number] Yes USE TO TEST BLOOD GLUCOSE THREE TIMES DAILY Tobie Suzzane POUR, MD  Active   acyclovir  (ZOVIRAX ) 400 MG tablet [id number] Yes TAKE 1 TABLET(400 MG) BY MOUTH THREE TIMES DAILY Tobie Suzzane POUR, MD  Active   albuterol  (PROVENTIL ) (2.5 MG/3ML) 0.083% nebulizer solution [id number] Yes Take 3 mLs (2.5 mg total) by nebulization every 6 (six) hours as needed for wheezing or shortness of breath. Parrett, Madelin RAMAN, NP  Active Self  Alcohol  Swabs  PADS [id number] Yes Use it as directed before blood glucose check 3 times daily. Tobie Suzzane POUR, MD  Active   ASPERCREME  LIDOCAINE  EX [id number] Yes Apply 1 Application topically 3 (three) times daily as needed (pain). [provider]  Active Self  Blood Pressure Monitoring (BLOOD PRESSURE MONITOR 3) DEVI [id number] Yes Check blood pressure once daily 1 hour after taking blood pressure medication Tobie Suzzane POUR, MD  Active   cetirizine  (ZYRTEC ) 10 MG tablet [id number] Yes Take 10 mg by mouth 2 (two) times daily. [provider]  Active Self  Continuous Glucose Receiver (DEXCOM G7 RECEIVER) ESPIRIDION [id number] Yes USE TO CHECK BLOOD GLUCOSE AS NEEDED Tobie Suzzane POUR, MD  Active Self  Continuous Glucose Sensor (DEXCOM G7 SENSOR) MISC [id number] Yes USE TO CHECK BLOOD SUGAR THREE TIMES DAILY, BEFORE MEALS AND AT BEDTIME AND AS NEEDED. CHANGE SENSOR EVERY 10 DAYS Tobie Suzzane POUR, MD  Active   dicyclomine  (BENTYL ) 10 MG capsule [id number] Yes Take 1 capsule (10 mg total) by mouth 4 (four) times daily -  before meals and at bedtime. Monitor for constipation, dry mouth, dizziness Shirlean Therisa ORN, NP  Active   diphenhydrAMINE  (BENADRYL ) 25 MG tablet [id number] Yes Take 50 mg by mouth daily as needed for allergies or itching. [provider]  Active Self  doxepin  (SINEQUAN ) 10 MG capsule [id number] Yes TAKE 1 CAPSULE(10 MG) BY MOUTH AT BEDTIME Tobie Suzzane POUR, MD  Active   DULoxetine  (CYMBALTA ) 60 MG capsule [id number] Yes TAKE 1 CAPSULE(60 MG) BY MOUTH TWICE DAILY Patel, Rutwik K, MD  Active   ezetimibe  (ZETIA )  10 MG tablet [id number] Yes Take 1 tablet (10 mg total) by mouth daily. Debera Jayson MATSU, MD  Active   gabapentin  (NEURONTIN ) 800 MG tablet [id number] Yes Take 1 tablet (800 mg total) by mouth 3 (three) times daily. TAKE 1 TABLET(800 MG) BY MOUTH THREE TIMES DAILY Patel, Rutwik K, MD  Active   Glucagon  (GVOKE HYPOPEN  2-PACK) 1 MG/0.2ML SOAJ [id number] Yes Inject 0.2 mLs into the skin as needed (Blood glucose < 53). Tobie Suzzane POUR, MD  Active Self  glucose blood (ACCU-CHEK GUIDE TEST) test strip [id number] Yes  Use as instructed 3 times daily. Tobie Suzzane POUR, MD  Active   Glycerin-Hypromellose-PEG 400 (DRY EYE RELIEF DROPS OP) [id number] Yes Place 1 drop into both eyes 3 (three) times daily as needed (Dry eye). [provider]  Active Self  HYDROcodone -acetaminophen  (NORCO) 10-325 MG tablet [id number] Yes Take 1 tablet by mouth every 4 (four) hours as needed. Stop Hydrocodone  7.5/325mg  tablet.   Active Self  ipratropium (ATROVENT  HFA) 17 MCG/ACT inhaler [id number] Yes Inhale 2 puffs into the lungs every 4 (four) hours as needed (COPD). Byrum, Robert S, MD  Active Self  isosorbide  dinitrate (ISORDIL ) 30 MG tablet [id number] Yes Take 1 tablet (30 mg total) by mouth daily. Tobie Suzzane POUR, MD  Active   JARDIANCE  10 MG TABS tablet [id number] Yes TAKE 1 TABLET(10 MG) BY MOUTH DAILY BEFORE BREAKFAST Patel, Rutwik K, MD  Active   Lancets Misc. (ACCU-CHEK SOFTCLIX LANCET DEV) KIT [id number] Yes USE TO CHECK GLUCOSE THREE TIMES DAILY Tobie Suzzane POUR, MD  Active Self  LORazepam  (ATIVAN ) 0.5 MG tablet [id number] Yes TAKE 1 TABLET(0.5 MG) BY MOUTH THREE TIMES DAILY AS NEEDED FOR ANXIETY Patel, Rutwik K, MD  Active   Magnesium  Hydroxide (MILK OF MAGNESIA PO) [id number] Yes Take by mouth. Takes as needed [provider]  Active   meclizine  (ANTIVERT ) 25 MG tablet [id number] Yes TAKE 1 TABLET(25 MG) BY MOUTH THREE TIMES DAILY AS NEEDED FOR DIZZINESS Patel, Rutwik K, MD  Active   methocarbamol  (ROBAXIN ) 500 MG tablet [id number] Yes TAKE 1 TABLET(500 MG) BY MOUTH EVERY 8 HOURS AS NEEDED FOR MUSCLE SPASMS Tobie Suzzane POUR, MD  Active   Misc. Devices KIT [id number] Yes Henmnii rollator walker Tobie Suzzane POUR, MD  Active Self  montelukast  (SINGULAIR ) 10 MG tablet [id number] Yes TAKE 1 TABLET(10 MG) BY MOUTH AT BEDTIME Shelah Lamar RAMAN, MD  Active   MOUNJARO  5 MG/0.5ML Pen [id number] Yes ADMINISTER 5 MG UNDER THE SKIN 1 TIME A WEEK Tobie, Suzzane POUR, MD  Active   nitroGLYCERIN  (NITROSTAT ) 0.4 MG SL tablet [id number] Yes Place 1  tablet (0.4 mg total) under the tongue every 5 (five) minutes as needed for chest pain. Debera Jayson MATSU, MD  Active Self  omeprazole  [facility]) 40 MG capsule [id number] Yes Take 1 capsule (40 mg total) by mouth 2 (two) times daily. Lamon Herter M, PA-C  Active   ondansetron  (ZOFRAN -ODT) 8 MG disintegrating tablet [id number] Yes DISSOLVE 1 TABLET EVERY 8 HOURS AS NEEDED FOR FOR NAUSEA AND VOMITING Rogers Hai, MD  Active   STIOLTO RESPIMAT  2.5-2.5 MCG/ACT AERS [id number] Yes INHALE 2 PUFFS INTO THE LUNGS DAILY Tobie Suzzane POUR, MD  Active   Vitamin D , Ergocalciferol , (DRISDOL ) 1.25 MG (50000 UNIT) CAPS capsule [id number] Yes TAKE 1 CAPSULE BY MOUTH EVERY 7 DAYS Tobie Suzzane POUR, MD  Active   XIFAXAN  550 MG TABS tablet [id number] Yes TAKE 1 TABLET(550 MG) BY MOUTH THREE TIMES DAILY FOR 14 DAYS Shirlean Kung  W, NP  Active             Recommendation:   Continue Current Plan of Care  Follow Up Plan:   Telephone follow-up in 1 month  Rosina Forte, BSN RN [facility], [facility] RN Care Manager Direct Dial: [phone]  Fax: [phone]

## 2024-07-22 NOTE — Patient Instructions (Signed)
 Visit Information  Thank you for taking time to visit with me today. Please don't hesitate to contact me if I can be of assistance to you before our next scheduled appointment.  Your next care management appointment is by telephone on 08-22-2024 at 10:30 am  Telephone follow-up in 1 month  Please call the care guide team at [phone] if you need to cancel, schedule, or reschedule an appointment.   Please call the Suicide and Crisis Lifeline: 988 call the [place]  National Suicide Prevention Lifeline: 310-107-8754 or TTY: (504)484-9062 TTY (306)783-7695) to talk to a trained counselor call 1-800-273-TALK (toll free, 24 hour hotline) if you are experiencing a Mental Health or Behavioral Health Crisis or need someone to talk to.  Rosina Forte, BSN RN [facility] RN Care Manager Direct Dial: [phone]  Fax: [phone]

## 2024-07-24 ENCOUNTER — Other Ambulatory Visit: Payer: Self-pay | Admitting: Adult Health

## 2024-07-29 ENCOUNTER — Telehealth: Payer: Self-pay | Admitting: Pharmacy Technician

## 2024-07-29 ENCOUNTER — Other Ambulatory Visit (HOSPITAL_COMMUNITY): Payer: Self-pay

## 2024-07-29 NOTE — Telephone Encounter (Signed)
 Pharmacy Patient Advocate Encounter  Received notification from [employer] that Prior Authorization for Mounjaro  5mg  has been APPROVED from now to 08/07/25   PA #/Case ID/Reference #: [id number]

## 2024-07-30 ENCOUNTER — Ambulatory Visit (INDEPENDENT_AMBULATORY_CARE_PROVIDER_SITE_OTHER): Admitting: Neurology

## 2024-07-30 ENCOUNTER — Other Ambulatory Visit

## 2024-07-30 ENCOUNTER — Encounter: Payer: Self-pay | Admitting: Neurology

## 2024-07-30 VITALS — BP 117/68 | HR 88 | Ht 68.0 in | Wt 151.0 lb

## 2024-07-30 DIAGNOSIS — M542 Cervicalgia: Secondary | ICD-10-CM

## 2024-07-30 DIAGNOSIS — R251 Tremor, unspecified: Secondary | ICD-10-CM

## 2024-07-30 NOTE — Patient Instructions (Signed)
 Check labs  Referral to start neck PT

## 2024-07-30 NOTE — Progress Notes (Signed)
 "   Follow-up Visit   Date: 07/30/2024    Kayla Ryan MRN: [id number] DOB: 09-18-1955    Kayla Ryan is a 68 y.o. 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  The patient was accompanied to the clinic by self.  IMPRESSION/PLAN: Assessment & Plan Essential tremor.  Intermittent limb tremors worsened post-fall in October.  CT neuroaxis did not show any new pathology.  There is mild degenerative changes in the cervical spine which would not explain her symptoms.  . Family history suggests benign essential tremor. No Parkinson's disease. Thyroid  dysfunction considered as prior TSH was elevated.  - Check TSH, free T3, free T4, vitamin B12 - Hold on starting medications for now  Neck pain with cervical spondylosis.  Imaging shows C3-C4 arthritic changes without nerve impingement.  - Referred to physical therapy for neck pain management.  Return to clinic in 6 months  --------------------------------------------- History of present illness: Starting in 2022, she reports having spells of electric current involving both side of the jaw and cheek.  It tends to occur in clusters and can last 1-2 days and she can go weeks or months without having her pain.  She also complains of neck pain and stiffness and has history of diabetic neuropathy.  She has is followed by pain clinic ([facility], Dr. Orlando) for chronic pain and takes Cymbalta  60mg  BID, gabapentin  800mg  TID, and Norco 10/325mg  q4h.  She takes robaxin  as needed.   She has been followed at [facility] for myopathy.  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  At her last visit, she underwent genetic testing, but results were not received.     UPDATE 07/30/2024: Discussed the use of AI scribe software for clinical note transcription with the patient, who gave verbal consent to proceed.  History of Present Illness She has been experiencing significant tremors that began after two falls in October, during which she hit her face on a concrete floor. The tremors are described as 'jerking, shaking' movements, primarily occurring in the mornings upon waking. They affect her legs and arms, sometimes severely enough to prevent her from holding a cup of coffee or using utensils. The tremors occur three to four days a week, with Fridays being the worst, lasting almost all day and leading her to go to bed out of frustration.  There is a family history of similar tremors in her mother's brother and sister, although it is clarified that it is not Parkinson's disease. The tremors do not seem to be related to stress or anxiety but worsen when she is hungry, despite normal blood sugar levels. She has not noticed any changes in her medications that could be contributing to the tremors.  She acknowledges that albuterol  can cause some jerking, but differentiates it from her current tremors.  She has a history of muscular dystrophy, specifically polymyositis, and mentions frequent falls due to her legs giving out. Her sister has body inclusion myositis, and she notes the rarity of having different strains within the same family. She has not returned to the muscular dystrophy clinic, citing the long drive and lack of treatment options.   Medications:  Medications Ordered Prior to Encounter[1]  Allergies: Allergies[2]  Vital Signs:  BP 117/68   Pulse  88   Ht 5' 8 (1.727 m)   Wt 151 lb (68.5 kg)   SpO2 96%   BMI 22.96 kg/m    Neurological Exam: MENTAL STATUS including orientation to time, place, person, recent and remote memory, attention span and concentration, language, and fund of knowledge is normal.  Speech is not dysarthric.  CRANIAL  NERVES:  No visual field defects.  Pupils equal round and reactive to light.  Normal conjugate, extra-ocular eye movements in all directions of gaze.  No ptosis.  Face is symmetric. Palate elevates symmetrically.  Tongue is midline.  MOTOR:  Motor strength is 5/5 in all extremities.  Coarse bilateral hand tremor with finger to nose testing and mild tremulousness of all the limbs at baseline. No atrophy.  No pronator drift.  Tone is normal.    MSRs:  Reflexes are 2+/4 throughout, except absent at the ankles.  COORDINATION/GAIT:  Normal finger-to- nose-finger.  Gait mildly wide based, somewhat unsteady, unassisted.   Data: CT head wo contrast 05/24/2024: IMPRESSION: 1. No acute intracranial abnormality. 2. Mild soft tissue thickening in the left parietal scalp. No calvarial fracture.  CT cervical spine wo contrast 05/24/2024: 1. No acute cervical spine fracture. 2. Progressive multilevel facet hypertrophic changes, greatest from C2-3 through C4-5.  CT thoracic spine wo contrast 05/24/2024: 1. No acute thoracic spine fracture. 2. Mild multilevel thoracic spondylosis. 3. Aortic Atherosclerosis (ICD10-I70.0) and Emphysema (ICD10-J43.9)    Thank you for allowing me to participate in patient's care.  If I can answer any additional questions, I would be pleased to do so.    Sincerely,    Keola Heninger K. Tobie, DO      [1]  Current Outpatient Medications on File Prior to Visit  Medication Sig Dispense Refill   Accu-Chek Softclix Lancets lancets USE TO TEST BLOOD GLUCOSE THREE TIMES DAILY 100 each 0   acyclovir  (ZOVIRAX ) 400 MG tablet TAKE 1 TABLET(400 MG) BY MOUTH THREE TIMES DAILY 90 tablet 3   albuterol  (PROVENTIL ) (2.5 MG/3ML) 0.083% nebulizer solution Take 3 mLs (2.5 mg total) by nebulization every 6 (six) hours as needed for wheezing or shortness of breath. 75 mL 12   Alcohol  Swabs  PADS Use it as directed before blood glucose check 3 times daily. 100 each 3   ASPERCREME LIDOCAINE   EX Apply 1 Application topically 3 (three) times daily as needed (pain).     azithromycin  (ZITHROMAX ) 250 MG tablet Take 250 mg by mouth daily.     Blood Pressure Monitoring (BLOOD PRESSURE MONITOR 3) DEVI Check blood pressure once daily 1 hour after taking blood pressure medication 1 each 0   cetirizine  (ZYRTEC ) 10 MG tablet Take 10 mg by mouth 2 (two) times daily.     Continuous Glucose Receiver (DEXCOM G7 RECEIVER) DEVI USE TO CHECK BLOOD GLUCOSE AS NEEDED 1 each 0   Continuous Glucose Sensor (DEXCOM G7 SENSOR) MISC USE TO CHECK BLOOD SUGAR THREE TIMES DAILY, BEFORE MEALS AND AT BEDTIME AND AS NEEDED. CHANGE SENSOR EVERY 10 DAYS 3 each 5   dicyclomine  (BENTYL ) 10 MG capsule Take 1 capsule (10 mg total) by mouth 4 (four) times daily -  before meals and at bedtime. Monitor for constipation, dry mouth, dizziness 120 capsule 3   diphenhydrAMINE  (BENADRYL ) 25 MG tablet Take 50 mg by mouth daily as needed for allergies or itching.     doxepin  (SINEQUAN ) 10 MG capsule TAKE 1 CAPSULE(10 MG) BY MOUTH AT BEDTIME 30 capsule 3   DULoxetine  (CYMBALTA ) 60 MG capsule TAKE 1  CAPSULE(60 MG) BY MOUTH TWICE DAILY 60 capsule 3   ezetimibe  (ZETIA ) 10 MG tablet Take 1 tablet (10 mg total) by mouth daily. 90 tablet 3   gabapentin  (NEURONTIN ) 800 MG tablet Take 1 tablet (800 mg total) by mouth 3 (three) times daily. TAKE 1 TABLET(800 MG) BY MOUTH THREE TIMES DAILY 90 tablet 5   Glucagon  (GVOKE HYPOPEN  2-PACK) 1 MG/0.2ML SOAJ Inject 0.2 mLs into the skin as needed (Blood glucose < 53). 0.4 mL 5   glucose blood (ACCU-CHEK GUIDE TEST) test strip Use as instructed 3 times daily. 100 each 12   Glycerin-Hypromellose-PEG 400 (DRY EYE RELIEF DROPS OP) Place 1 drop into both eyes 3 (three) times daily as needed (Dry eye).     HYDROcodone -acetaminophen  (NORCO) 10-325 MG tablet Take 1 tablet by mouth every 4 (four) hours as needed. Stop Hydrocodone  7.5/325mg  tablet. 180 tablet 0   ipratropium (ATROVENT  HFA) 17 MCG/ACT inhaler  Inhale 2 puffs into the lungs every 4 (four) hours as needed (COPD). 1 each 4   isosorbide  dinitrate (ISORDIL ) 30 MG tablet Take 1 tablet (30 mg total) by mouth daily. 90 tablet 1   JARDIANCE  10 MG TABS tablet TAKE 1 TABLET(10 MG) BY MOUTH DAILY BEFORE BREAKFAST 30 tablet 5   Lancets Misc. (ACCU-CHEK SOFTCLIX LANCET DEV) KIT USE TO CHECK GLUCOSE THREE TIMES DAILY 1 kit 0   LORazepam  (ATIVAN ) 0.5 MG tablet TAKE 1 TABLET(0.5 MG) BY MOUTH THREE TIMES DAILY AS NEEDED FOR ANXIETY 90 tablet 3   Magnesium  Hydroxide (MILK OF MAGNESIA PO) Take by mouth. Takes as needed     meclizine  (ANTIVERT ) 25 MG tablet TAKE 1 TABLET(25 MG) BY MOUTH THREE TIMES DAILY AS NEEDED FOR DIZZINESS 30 tablet 1   methocarbamol  (ROBAXIN ) 500 MG tablet TAKE 1 TABLET(500 MG) BY MOUTH EVERY 8 HOURS AS NEEDED FOR MUSCLE SPASMS 30 tablet 2   Misc. Devices KIT Henmnii rollator walker 1 kit 0   montelukast  (SINGULAIR ) 10 MG tablet TAKE 1 TABLET(10 MG) BY MOUTH AT BEDTIME 90 tablet 3   MOUNJARO  5 MG/0.5ML Pen ADMINISTER 5 MG UNDER THE SKIN 1 TIME A WEEK 6 mL 1   nitroGLYCERIN  (NITROSTAT ) 0.4 MG SL tablet Place 1 tablet (0.4 mg total) under the tongue every 5 (five) minutes as needed for chest pain. 90 tablet 3   omeprazole  (PRILOSEC) 40 MG capsule Take 1 capsule (40 mg total) by mouth 2 (two) times daily. 180 capsule 3   ondansetron  (ZOFRAN -ODT) 8 MG disintegrating tablet DISSOLVE 1 TABLET EVERY 8 HOURS AS NEEDED FOR FOR NAUSEA AND VOMITING 257 tablet 1   STIOLTO RESPIMAT  2.5-2.5 MCG/ACT AERS INHALE 2 PUFFS INTO THE LUNGS DAILY 4 g 11   Vitamin D , Ergocalciferol , (DRISDOL ) 1.25 MG (50000 UNIT) CAPS capsule TAKE 1 CAPSULE BY MOUTH EVERY 7 DAYS 12 capsule 1   XIFAXAN  550 MG TABS tablet TAKE 1 TABLET(550 MG) BY MOUTH THREE TIMES DAILY FOR 14 DAYS (Patient not taking: Reported on 07/30/2024) 42 tablet 0   No current facility-administered medications on file prior to visit.  [2]  Allergies Allergen Reactions   Sulfa Antibiotics Shortness  Of Breath, Swelling and Rash   Chicken Allergy Diarrhea and Nausea And Vomiting   Clindamycin /Lincomycin Itching and Nausea And Vomiting   Dilaudid  [Hydromorphone  Hcl]     Makes me crazy   Ferrlecit [Na Ferric Gluc Cplx In Sucrose] Itching    Itching and throat tightness   Iron      Throat starts closing up, tongue swells, severe headaches  and backpain   Metronidazole Diarrhea and Nausea And Vomiting   Other     Green peas - itching and nauea   Prednisone  Other (See Comments)    Angry     Statins Other (See Comments)    Leg pain   Tuna Oil [Fish Oil] Diarrhea and Nausea And Vomiting   Budesonide  Nausea And Vomiting and Rash    Nebulizing solution    Cephalexin Diarrhea and Nausea And Vomiting   Methotrexate And Trimetrexate Swelling and Rash   Morphine  And Codeine Itching and Anxiety    exreme mood swings   Tomato Itching and Rash   "

## 2024-07-31 ENCOUNTER — Other Ambulatory Visit: Payer: Self-pay | Admitting: Internal Medicine

## 2024-07-31 ENCOUNTER — Ambulatory Visit: Payer: Self-pay | Admitting: Neurology

## 2024-07-31 DIAGNOSIS — E162 Hypoglycemia, unspecified: Secondary | ICD-10-CM

## 2024-07-31 LAB — T4, FREE: Free T4: 1.2 ng/dL (ref 0.8–1.8)

## 2024-07-31 LAB — VITAMIN B12: Vitamin B-12: 337 pg/mL (ref 200–1100)

## 2024-07-31 LAB — T3, FREE: T3, Free: 3.4 pg/mL (ref 2.3–4.2)

## 2024-07-31 LAB — TSH: TSH: 5.21 m[IU]/L — ABNORMAL HIGH (ref 0.40–4.50)

## 2024-08-01 ENCOUNTER — Other Ambulatory Visit: Payer: Self-pay | Admitting: Internal Medicine

## 2024-08-01 DIAGNOSIS — I5032 Chronic diastolic (congestive) heart failure: Secondary | ICD-10-CM

## 2024-08-01 NOTE — Progress Notes (Unsigned)
 " Cardiology Office Note:  .   Date:  08/06/2024  ID:  Kayla Ryan, DOB [birth year], MRN [id number] PCP: Tobie Suzzane POUR, MD  [facility] Providers Cardiologist:  Jayson Sierras, MD {  History of Present Illness: Kayla Ryan is a 68 y.o. 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   Pertinent cardiac medical history:  Cardiac catheterization with Dr. Court in 2015 demonstrating normal coronary arteries.  History of afib during hospitalization 06/2020.  Myocardial Perfusion 01/2013: normal w/ EF of 77%. Echo 03/2023: LVEF 65 to 70% with mild diastolic dysfunction, normal RV contraction.  Carotid US  03/2023: 50 to 69% bilateral ICA stenosis.  Carotid US  03/2024: < 50% RICA/LICA stenosis.   Last seen in heartcare 03/04/2024 by Dr. Sierras for follow up. Doing well without any cardiac complaints. Noted LDL improved to 82 while on Zetia . No med changes. Continued on Jardiance  10 mg daily, Mounjaro  5 mg weekly, Zetia  10 mg daily, Isordil  30 mg daily, NTG PRN.   Seen by pulmonary 07/18/2024. Reported exertional CP that was relieved NTG x4 in the last week. Recommended follow up with cardiology.   Today, reports intermittent chest pain x 6 month described as excruciating pressure on the left side of chest that radiates to neck/jaw that can occur at rest but mainly with exertion (showering, washing dishes) associated with not being able to catch her breath, nausea, and cold sweats. It is usually relief with NTG x1. It occurs approx 1-2 times per week. Also, reports baseline unchanged SOB related to COPD. She use daily home O2. She denies any recent fever, illness, excessive caffeine use, dehydration.  Denies  palpitations, syncope, presyncope, dizziness, orthopnea, PND, swelling or significant weight changes, acute bleeding, or claudication.  Reports compliance with medications. Denies tobacco use/alcohol /drug use.   ROS: 10 point review of system has been reviewed and considered negative except ones been listed in the HPI.   Studies Reviewed: Kayla   EKG Interpretation Date/Time:  Friday August 02 2024 13:27:39 EST Ventricular Rate:  107 PR Interval:  178 QRS Duration:  112 QT Interval:  344 QTC Calculation: 459 R Axis:   89  Text Interpretation: Sinus tachycardia Right atrial enlargement Nonspecific ST abnormality When compared with ECG of 24-May-2024 14:45, PREVIOUS ECG IS PRESENT Confirmed by Sheron Hallmark ([id number]) on 08/02/2024 1:34:06 PM   ECHO 03/2023 IMPRESSIONS   1. Left ventricular ejection fraction, by estimation, is 65 to 70%. The  left ventricle has normal function. The left ventricle has no regional  wall motion abnormalities. Left ventricular diastolic parameters are  consistent with Grade I diastolic dysfunction (impaired relaxation).   2. Right ventricular systolic function is normal. The right ventricular  size is normal. Tricuspid regurgitation signal is inadequate for assessing  PA pressure.   3. Left atrial size was mildly dilated.   4. The mitral valve is grossly normal. Trivial mitral valve regurgitation.   5. The aortic valve is tricuspid. There is mild calcification of the  aortic valve. Aortic valve regurgitation is not visualized. Aortic valve  sclerosis is present, with no evidence of aortic valve stenosis. Aortic  valve mean gradient measures 7.0 mmHg.   6. The inferior vena cava is normal in size with  greater than 50%  respiratory variability, suggesting right atrial pressure of 3 mmHg.   Comparison(s): Prior images reviewed side by side. LVEF remains vigorous  at 65-70%.   Carotid US  03/2024 IMPRESSION: The right internal carotid artery demonstrates mixed  plaque with less than 50% stenosis. The left internal carotid artery demonstrates less than 50% stenosis. When compared with the previous study no significant change. On today's study velocities and ICA/CCA ratios are congruent Risk Assessment/Calculations:    CHA2DS2-VASc Score = 4  This indicates a 4.8% annual risk of stroke. The patient's score is based upon: CHF History: 1 HTN History: 0 Diabetes History: 0 Stroke History: 0 Vascular Disease History: 1 Age Score: 1 Gender Score: 1  Physical Exam:   VS:  BP 122/68 (BP Location: Right Arm, Cuff Size: Normal)   Pulse (!) 107   Ht 5' 8 (1.727 m)   Wt 149 lb (67.6 kg)   SpO2 97% Comment: on oxygen  3 liters via   BMI 22.66 kg/m    Wt Readings from Last 3 Encounters:  08/02/24 149 lb (67.6 kg)  07/30/24 151 lb (68.5 kg)  07/18/24 152 lb (68.9 kg)    GEN: Well nourished, well developed in no acute distress while sitting in chair.  NECK: No JVD; No carotid bruits CARDIAC: RRR, no murmurs, rubs, gallops RESPIRATORY:  Clear to auscultation without rales, wheezing or rhonchi  ABDOMEN: Soft, non-tender, non-distended EXTREMITIES:  No edema; No deformity   ASSESSMENT AND PLAN: .   Chest pain  Previous cath, lexiscan, and echo normal as above.  Reports chest pain concerning for typical features, however has been present for 6 months and not increasing in frequency/severity.  Order ECHO and Lexiscan.  Offered to increase Isordil  but patient decline for now and is willing to reconsider if after test results.  Continue on Isordil  30 mg daily, NTG PRN.   Sinus Tachycardia 05/2024: CBC, K Normal  07/2024: Elevated TSH 5.2 with normal T3/4.  EKG today: ST, HR 107 with no S1Q3T3  Order BMP, BNP, Mg, CBC.  Due to ST, CP and SOB, would like to r/o PE. Order d-dimer per DOD VM.  If d-dimer elevated then would consider V/Q scan.   Chronic heart failure with preserved ejection fraction (HCC) Reports baseline unchanged DOE.  Denies  edema, orthopnea, or PND. Appears Euvolemic on exam. Weight loss noted with 163 lbs in 02/2024 to 149 lbs today.  Continue on Jardiance  10 mg daily, Mounjaro  5 mg.  Order echo.  Order BNP to r/o volume overload per DOD VM.  No diuretic requirement at this point.  Encouraged low sodium diet, fluid restriction <2L, and daily weights.  Educated to contact our office for weight gain of 2 lbs overnight or 5 lbs in one week. ED precautions discussed.    Paroxysmal atrial fibrillation (HCC) EKG today shows no recurrence of afib.  On exam noted regular tachycardia Denies palpitations, dizziness. No recurrent dark stools or bleeding. No need for rate control at this time.  Not on OAC with history of severe GI bleeding.   Bilateral carotid artery stenosis Carotid Stenosis  No bruits on exam. Denies presyncope/syncope.  Continue to monitor.  Will plan to repeat Carotid US  in 03/2025 unless indicated sooner.   Hyperlipidemia LDL goal <70 Myalgia due to statin LDL mildly elevated as below.  Continue on Zetia  10 mg daily.  Prior intolerance to omega-3 supplements.  Order Lipoprotein A.  Discussed Repatha. Patient will research and will plan to discuss again  at follow up.   Lipid Panel     Component Value Date/Time   CHOL 202 (H) 01/23/2024 1338   TRIG 529 (H) 01/23/2024 1338   HDL 35 (L) 01/23/2024 1338   CHOLHDL 5.8 (H) 01/23/2024 1338   LDLCALC 82 01/23/2024 1338   LABVLDL 85 (H) 01/23/2024 1338       Dispo: Follow up with Scottie, PA-C  in 6-8 weeks.   Signed, Lorette CINDERELLA Kapur, PA-C  "

## 2024-08-02 ENCOUNTER — Ambulatory Visit: Attending: Physician Assistant | Admitting: Physician Assistant

## 2024-08-02 ENCOUNTER — Other Ambulatory Visit (HOSPITAL_COMMUNITY)
Admission: RE | Admit: 2024-08-02 | Discharge: 2024-08-02 | Disposition: A | Discharge status: Home / Self Care | Source: Ambulatory Visit | Attending: Physician Assistant | Admitting: Physician Assistant

## 2024-08-02 ENCOUNTER — Encounter: Payer: Self-pay | Admitting: Physician Assistant

## 2024-08-02 VITALS — BP 122/68 | HR 107 | Ht 68.0 in | Wt 149.0 lb

## 2024-08-02 DIAGNOSIS — I5032 Chronic diastolic (congestive) heart failure: Secondary | ICD-10-CM

## 2024-08-02 DIAGNOSIS — I480 Paroxysmal atrial fibrillation: Secondary | ICD-10-CM | POA: Diagnosis present

## 2024-08-02 DIAGNOSIS — T466X5A Adverse effect of antihyperlipidemic and antiarteriosclerotic drugs, initial encounter: Secondary | ICD-10-CM | POA: Insufficient documentation

## 2024-08-02 DIAGNOSIS — M791 Myalgia, unspecified site: Secondary | ICD-10-CM

## 2024-08-02 DIAGNOSIS — I6523 Occlusion and stenosis of bilateral carotid arteries: Secondary | ICD-10-CM | POA: Insufficient documentation

## 2024-08-02 DIAGNOSIS — T466X5D Adverse effect of antihyperlipidemic and antiarteriosclerotic drugs, subsequent encounter: Secondary | ICD-10-CM

## 2024-08-02 DIAGNOSIS — R079 Chest pain, unspecified: Secondary | ICD-10-CM | POA: Diagnosis present

## 2024-08-02 DIAGNOSIS — E785 Hyperlipidemia, unspecified: Secondary | ICD-10-CM | POA: Insufficient documentation

## 2024-08-02 LAB — CBC
HCT: 43.9 % (ref 36.0–46.0)
Hemoglobin: 13.9 g/dL (ref 12.0–15.0)
MCH: 29 pg (ref 26.0–34.0)
MCHC: 31.7 g/dL (ref 30.0–36.0)
MCV: 91.5 fL (ref 80.0–100.0)
Platelets: 421 K/uL — ABNORMAL HIGH (ref 150–400)
RBC: 4.8 MIL/uL (ref 3.87–5.11)
RDW: 12.4 % (ref 11.5–15.5)
WBC: 6.6 K/uL (ref 4.0–10.5)
nRBC: 0 % (ref 0.0–0.2)

## 2024-08-02 LAB — BASIC METABOLIC PANEL WITH GFR
Anion gap: 14 (ref 5–15)
BUN: 21 mg/dL (ref 8–23)
CO2: 26 mmol/L (ref 22–32)
Calcium: 9.7 mg/dL (ref 8.9–10.3)
Chloride: 99 mmol/L (ref 98–111)
Creatinine, Ser: 0.86 mg/dL (ref 0.44–1.00)
GFR, Estimated: >60 mL/min
Glucose, Bld: 83 mg/dL (ref 70–99)
Potassium: 4.2 mmol/L (ref 3.5–5.1)
Sodium: 139 mmol/L (ref 135–145)

## 2024-08-02 LAB — MAGNESIUM: Magnesium: 2.5 mg/dL — ABNORMAL HIGH (ref 1.7–2.4)

## 2024-08-02 LAB — D-DIMER, QUANTITATIVE: D-Dimer, Quant: 0.52 ug{FEU}/mL — ABNORMAL HIGH (ref 0.00–0.50)

## 2024-08-02 LAB — PRO BRAIN NATRIURETIC PEPTIDE: Pro Brain Natriuretic Peptide: 174 pg/mL

## 2024-08-02 NOTE — Patient Instructions (Addendum)
 Medication Instructions:  Your physician recommends that you continue on your current medications as directed. Please refer to the Current Medication list given to you today.   Labwork: CBC,magnesium ,BMET,D-dimer,lipo (a)  Testing/Procedures: Your physician has requested that you have en exercise stress myoview. For further information please visit [email or web address] Please follow instruction sheet, as given.   Your physician has requested that you have an echocardiogram. Echocardiography is a painless test that uses sound waves to create images of your heart. It provides your doctor with information about the size and shape of your heart and how well your hearts chambers and valves are working. This procedure takes approximately one hour. There are no restrictions for this procedure. Please do NOT wear cologne, perfume, aftershave, or lotions (deodorant is allowed). Please arrive 15 minutes prior to your appointment time.  Please note: We ask at that you not bring children with you during ultrasound (echo/ vascular) testing. Due to room size and safety concerns, children are not allowed in the ultrasound rooms during exams. Our front office staff cannot provide observation of children in our lobby area while testing is being conducted. An adult accompanying a patient to their appointment will only be allowed in the ultrasound room at the discretion of the ultrasound technician under special circumstances. We apologize for any inconvenience.   Follow-Up: 6-8 weeks   Any Other Special Instructions Will Be Listed Below (If Applicable).  If you need a refill on your cardiac medications before your next appointment, please call your pharmacy.

## 2024-08-04 LAB — LIPOPROTEIN A (LPA): Lipoprotein (a): 44.1 nmol/L — ABNORMAL HIGH

## 2024-08-05 ENCOUNTER — Encounter: Payer: Self-pay | Admitting: *Deleted

## 2024-08-05 ENCOUNTER — Ambulatory Visit: Payer: Self-pay | Admitting: Physician Assistant

## 2024-08-06 ENCOUNTER — Encounter: Payer: Self-pay | Admitting: Physician Assistant

## 2024-08-07 ENCOUNTER — Other Ambulatory Visit: Payer: Self-pay | Admitting: Emergency Medicine

## 2024-08-07 NOTE — Telephone Encounter (Signed)
 Copied from CRM #[id number]. Topic: Clinical - Medication Refill >> Aug 07, 2024 10:11 AM Essie A wrote: Medication: ipratropium (ATROVENT  HFA) 17 MCG/ACT inhaler (need this asap)  Has the patient contacted their pharmacy? Yes (Agent: If no, request that the patient contact the pharmacy for the refill. If patient does not wish to contact the pharmacy document the reason why and proceed with request.) (Agent: If yes, when and what did the pharmacy advise?)  This is the patient's preferred pharmacy:  [id number] - [place], [place] - [address] AT [address] [place] [address] Phone: [phone] Fax: [phone]  Is this the correct pharmacy for this prescription? Yes If no, delete pharmacy and type the correct one.   Has the prescription been filled recently? No  Is the patient out of the medication? Yes  Has the patient been seen for an appointment in the last year OR does the patient have an upcoming appointment? Yes  Can we respond through MyChart? Yes or text message or call  Agent: Please be advised that Rx refills may take up to 3 business days. We ask that you follow-up with your pharmacy.

## 2024-08-09 ENCOUNTER — Ambulatory Visit: Payer: Self-pay | Admitting: Emergency Medicine

## 2024-08-09 NOTE — Telephone Encounter (Signed)
 FYI Only or Action Required?: Action required by provider: medication refill request.  Patient is followed in Pulmonology for COPD, last seen on 07/18/2024 by Shelah Lamar RAMAN, MD.  Called Nurse Triage reporting Medication Refill.  Symptoms began several days ago.  Interventions attempted: Nebulizer treatments.  Symptoms are: unchanged.  Triage Disposition: Call PCP Now  Patient/caregiver understands and will follow disposition?: Yes    Copied from CRM [id number]. Topic: Clinical - Red Word Triage >> Aug 09, 2024  4:30 PM Leila BROCKS wrote: Red Word that prompted transfer to Nurse Triage: Patient [phone] states she thought Dr. Byrum refilled albuterol  sulfate HFA inhaler 90 mcg/200 during office visit 07/18/24. [facility] pharmacy has been refilling albuterol  (PROVENTIL ) (2.5 MG/3ML) 0.083% nebulizer solution refill, not the albuterol  inhaler. Patient states has severe shortness of breath, already did the nebulizer which is making her shaking, coughing, dizziness, denies fever. Please advise.   [id number] - [place], [place] - [address] AT [address] [place] [address] Phone: [phone] Fax: [phone]   Reason for Disposition  [1] Prescription refill request for ESSENTIAL medicine (i.e., likelihood of harm to patient if not taken) AND [2] triager unable to refill per department policy  Answer Assessment - Initial Assessment Questions 1. DRUG NAME: What medicine do you need to have refilled?    Pt requesting refill of albuterol  inhaler and atrovent  inhaler. Pt seen 07/18/24 and has plenty albuterol  amp for nebulizer. Pt states she is out of her inhaler so she has been using nebulizer tx BID. Pt denies chest pain, fever or distress. Pt reports SOB with exertion. Discussed current symptoms and pt states she is only symptomatic with exertion. States that post neb tx, she has dizziness and shakiness but these are not  new. Pt requested rx to [facility].  Protocols used: Medication Refill and Renewal Call-A-AH

## 2024-08-12 ENCOUNTER — Other Ambulatory Visit: Payer: Self-pay | Admitting: *Deleted

## 2024-08-12 MED ORDER — IPRATROPIUM BROMIDE HFA 17 MCG/ACT IN AERS: 2.0000 | INHALATION_SPRAY | RESPIRATORY_TRACT | 4 refills | Status: AC | PRN

## 2024-08-12 NOTE — Telephone Encounter (Signed)
 I called and spoke with patient, verified she only needed a refill of the Atrovent  inhaler.  She has Stiolto and Albuterol  nebulizer solution.  I verified her pharmacy and sent refill of Atrovent  inhaler.  Nothing further needed.

## 2024-08-13 ENCOUNTER — Other Ambulatory Visit: Payer: Self-pay | Admitting: Internal Medicine

## 2024-08-13 DIAGNOSIS — R42 Dizziness and giddiness: Secondary | ICD-10-CM

## 2024-08-21 ENCOUNTER — Encounter: Payer: Self-pay | Admitting: Gastroenterology

## 2024-08-21 ENCOUNTER — Ambulatory Visit: Admitting: Gastroenterology

## 2024-08-21 VITALS — BP 130/79 | HR 99 | Temp 98.6°F | Ht 68.0 in | Wt 147.6 lb

## 2024-08-21 DIAGNOSIS — R197 Diarrhea, unspecified: Secondary | ICD-10-CM | POA: Insufficient documentation

## 2024-08-21 DIAGNOSIS — R7989 Other specified abnormal findings of blood chemistry: Secondary | ICD-10-CM

## 2024-08-21 DIAGNOSIS — K529 Noninfective gastroenteritis and colitis, unspecified: Secondary | ICD-10-CM | POA: Diagnosis not present

## 2024-08-21 MED ORDER — URSODIOL 500 MG PO TABS: 500.0000 mg | ORAL_TABLET | Freq: Two times a day (BID) | ORAL | 5 refills | Status: AC

## 2024-08-21 MED ORDER — PANCRELIPASE (LIP-PROT-AMYL) 36000-114000 UNITS PO CPEP: ORAL_CAPSULE | ORAL | Status: AC

## 2024-08-21 NOTE — Progress Notes (Signed)
 Medication Samples have been provided to the patient.  Drug name: creon        Strength: 36,000        Qty: 3 boxes  LOT: [id number]  Exp.Date: 2026/09  Dosing instructions: take 1 cap with meal three times a day  The patient has been instructed regarding the correct time, dose, and frequency of taking this medication, including desired effects and most common side effects.   Kayla Ryan [name] 12:38 PM 08/21/2024

## 2024-08-21 NOTE — Progress Notes (Signed)
 "   Gastroenterology Office Note     Primary Care Physician:  Tobie Suzzane POUR, MD  Primary Gastroenterologist: Dr Cindie   Chief Complaint   Chief Complaint  Patient presents with   Follow-up    Follow up after MRCP / Dysphagia. Pt also wants to discuss her navel (hernia)     History of Present Illness   Kayla Ryan is a 69 y.o. 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   Elevated transaminases: US  abdomen with hepatic steatosis and splenomegaly. Hep C negative in 2023. No iron  overload. Thorough serologies and found to have elevated AMA at 27.3, normal IgG, IgM. Notes pruritus chronic. Negative celiac serologies.   MRI/MRCP: normal liver, non-cirrhotic, Mild central intrahepatic bile duct dilation and mild dilation of extrahepatic bile duct which gradually tapers at the Ampulla of Vater. No obstructing mass, choledocholithiasis or stricture. Findings are likely due to post cholecystectomy status.  Chronic diarrhea: celiac serologies negative,last colonoscopy 2022. Had taken Xifaxan  in the summer last year with good improvement. Will take dicyclomine  only if having spasms and good improvement. On average will have 5 stools a day. Bad day will be every hour. States this is her baseline. Bloating. Has gas-x and will take with eating. Sometimes stool is oily. Worse after cholecystectomy  about 10-12 years ago.   On azithromycin  daily ongoing for last 2 months per Pulmonology.   She is concerned regarding additional medications as on fixed income and finances are tight. Even low copays have been difficult.    Previous pertinent work up:    BPE Oct 2025: esophageal  dysmotility, laryngeal penetration of thin barium without frank aspiration.   EGD May 2025 with mild Schatzki ring s/p dilation, possible candida esophagitis s/p cytologiy but negative gastritis, s/p biopsy, normal duodenum. Negative H.pylori   Colonoscopy September 2022: -Diverticulosis in the sigmoid colon -Redundant and elongated colon   Remote hx of polyps (Dr Jama over 5 years ago).    EGD September 2022: - Normal esophagus. Status post esophageal dilation - Normal stomach. - Normal duodenal bulb, second portion of the duodenum and third portion of the duodenum.   Capsule Feb 2023: few stomach erosions, 3 AVMs, no signs of IBD.      Diagnosed with Crohn's at [facility]. Records not available.  Niece with Crohn's    Father: mesenteric ischemia, passed   Past Medical History:  Diagnosis Date   Allergy Unknown   Anemia    Anxiety 1883   Arthritis    Asthma 1980   CHF (congestive heart failure) (HCC) 2021   Chronic bronchitis    Crohn disease (HCC)    Emphysema    Emphysema of lung (HCC) 2003   Fibromyalgia    GERD (gastroesophageal reflux disease)    GI bleed    Herpes    History of blood transfusion    Hyperlipidemia    Hypertension 2015   IBS (irritable bowel syndrome)    Kidney failure    Migraines    Muscular dystrophy (HCC)    Neck pain    Oxygen  deficiency 2021   PAF (paroxysmal atrial fibrillation) (HCC)    Not anticoagulated with history of severe GI bleeding   Plantar fasciitis  Polymyositis (HCC)    PONV (postoperative nausea and vomiting)    Right thyroid  nodule 04/07/2022   Scoliosis    Type 2 diabetes mellitus (HCC)     Past Surgical History:  Procedure Laterality Date   ABDOMINAL HYSTERECTOMY     AGILE CAPSULE N/A 06/24/2021   Procedure: AGILE CAPSULE;  Surgeon: Cindie Carlin POUR, DO;  [facility];  Service: Endoscopy;  Laterality: N/A;   AGILE CAPSULE N/A 07/22/2021   Procedure: AGILE CAPSULE;  Surgeon: Shaaron Lamar HERO,  MD;  [facility];  Service: Endoscopy;  Laterality: N/A;  7:30am   bone spur     BREAST BIOPSY Left 11/17/2016   fibrocystic changes with associated microcalcifications and a fibroadenoma   BREAST BIOPSY Left 06/06/2024   US  LT BREAST BX W LOC DEV 1ST LESION IMG BX SPEC US  GUIDE 06/06/2024 Mir, Aliene SAUNDERS, MD [facility]   BREAST BIOPSY Right 06/06/2024   US  RT BREAST BX W LOC DEV 1ST LESION IMG BX SPEC US  GUIDE 06/06/2024 Mir, Aliene SAUNDERS, MD [facility]   BREAST BIOPSY Left 06/06/2024   US  LT BREAST BX W LOC DEV EA ADD LESION IMG BX SPEC US  GUIDE 06/06/2024 Mir, Aliene SAUNDERS, MD [facility]   BREAST BIOPSY Left 06/26/2024   MM LT BREAST BX W LOC DEV 1ST LESION IMAGE BX SPEC STEREO GUIDE 06/26/2024 [facility]   BREAST SURGERY  1995   CHOLECYSTECTOMY     COLON SURGERY  1995   COLONOSCOPY WITH PROPOFOL  N/A 04/30/2021   Procedure: COLONOSCOPY WITH PROPOFOL ;  Surgeon: Shaaron Lamar HERO, MD;  [facility];  Service: Endoscopy;  Laterality: N/A;   COSMETIC SURGERY  1980   ESOPHAGEAL DILATION N/A 04/30/2021   Procedure: ESOPHAGEAL DILATION;  Surgeon: Shaaron Lamar HERO, MD;  [facility];  Service: Endoscopy;  Laterality: N/A;   ESOPHAGEAL DILATION N/A 12/14/2023   Procedure: DILATION, ESOPHAGUS;  Surgeon: Cindie Carlin POUR, DO;  [facility];  Service: Endoscopy;  Laterality: N/A;   ESOPHAGOGASTRODUODENOSCOPY N/A 12/14/2023   Procedure: EGD (ESOPHAGOGASTRODUODENOSCOPY);  Surgeon: Cindie Carlin POUR, DO;  [facility];  Service: Endoscopy;  Laterality: N/A;  9:15 am, asa 3   ESOPHAGOGASTRODUODENOSCOPY (EGD) WITH PROPOFOL  N/A 04/30/2021   Procedure: ESOPHAGOGASTRODUODENOSCOPY (EGD) WITH PROPOFOL ;  Surgeon: Shaaron Lamar HERO, MD;  [facility];  Service: Endoscopy;  Laterality: N/A;   GIVENS CAPSULE STUDY N/A 09/22/2021   Procedure: GIVENS CAPSULE STUDY;  Surgeon: Shaaron Lamar HERO, MD;  [facility];  Service: Endoscopy;   Laterality: N/A;  7:30am   HERNIA REPAIR     LEFT HEART CATHETERIZATION WITH CORONARY ANGIOGRAM N/A 11/07/2013   Procedure: LEFT HEART CATHETERIZATION WITH CORONARY ANGIOGRAM;  Surgeon: Dorn JINNY Lesches, MD;  Location: [facility] C[facility] LAB;  Service: Cardiovascular;  Laterality: N/A;   MASTECTOMY PARTIAL / LUMPECTOMY     OOPHORECTOMY     ROTATOR CUFF REPAIR     TOOTH EXTRACTION N/A 04/15/2022   Procedure: DENTAL RESTORATION/EXTRACTIONS;  Surgeon: Sheryle Hamilton, DMD;  Location: [facility];  Service: Oral Surgery;  Laterality: N/A;   TOOTH EXTRACTION N/A 09/15/2023   Procedure: DENTAL RESTORATION/EXTRACTIONS;  Surgeon: Sheryle Hamilton, DMD;  Location: [facility];  Service: Oral Surgery;  Laterality: N/A;    Current Outpatient Medications  Medication Sig Dispense Refill   Accu-Chek Softclix Lancets lancets USE TO TEST BLOOD GLUCOSE THREE TIMES DAILY 100 each 0   acyclovir  (ZOVIRAX ) 400 MG tablet TAKE 1 TABLET(400 MG) BY MOUTH THREE  TIMES DAILY 90 tablet 3   albuterol  (PROVENTIL ) (2.5 MG/3ML) 0.083% nebulizer solution Take 3 mLs (2.5 mg total) by nebulization every 6 (six) hours as needed for wheezing or shortness of breath. 75 mL 12   Alcohol  Swabs  PADS Use it as directed before blood glucose check 3 times daily. 100 each 3   ASPERCREME LIDOCAINE  EX Apply 1 Application topically 3 (three) times daily as needed (pain).     azithromycin  (ZITHROMAX ) 250 MG tablet Take 250 mg by mouth daily.     Blood Pressure Monitoring (BLOOD PRESSURE MONITOR 3) DEVI Check blood pressure once daily 1 hour after taking blood pressure medication 1 each 0   cetirizine  (ZYRTEC ) 10 MG tablet Take 10 mg by mouth 2 (two) times daily.     Continuous Glucose Receiver (DEXCOM G7 RECEIVER) DEVI USE TO CHECK BLOOD GLUCOSE AS NEEDED 1 each 0   Continuous Glucose Sensor (DEXCOM G7 SENSOR) MISC USE TO CHECK BLOOD SUGAR THREE TIMES DAILY, BEFORE MEALS AND AT BEDTIME AND AS NEEDED. CHANGE SENSOR EVERY 10 DAYS 3 each 5   dicyclomine  (BENTYL ) 10 MG  capsule Take 1 capsule (10 mg total) by mouth 4 (four) times daily -  before meals and at bedtime. Monitor for constipation, dry mouth, dizziness 120 capsule 3   diphenhydrAMINE  (BENADRYL ) 25 MG tablet Take 50 mg by mouth daily as needed for allergies or itching.     doxepin  (SINEQUAN ) 10 MG capsule TAKE 1 CAPSULE(10 MG) BY MOUTH AT BEDTIME 30 capsule 3   DULoxetine  (CYMBALTA ) 60 MG capsule TAKE 1 CAPSULE(60 MG) BY MOUTH TWICE DAILY 60 capsule 3   ezetimibe  (ZETIA ) 10 MG tablet Take 1 tablet (10 mg total) by mouth daily. 90 tablet 3   gabapentin  (NEURONTIN ) 800 MG tablet Take 1 tablet (800 mg total) by mouth 3 (three) times daily. TAKE 1 TABLET(800 MG) BY MOUTH THREE TIMES DAILY 90 tablet 5   glucose blood (ACCU-CHEK GUIDE TEST) test strip Use as instructed 3 times daily. 100 each 12   Glycerin-Hypromellose-PEG 400 (DRY EYE RELIEF DROPS OP) Place 1 drop into both eyes 3 (three) times daily as needed (Dry eye).     GVOKE HYPOPEN  2-PACK 1 MG/0.2ML SOAJ INJECT 0.2 MLS UNDER THE SKIN AS NEEDED BLOOD GLUCOSE<53 0.4 mL 5   HYDROcodone -acetaminophen  (NORCO) 10-325 MG tablet Take 1 tablet by mouth every 4 (four) hours as needed. Stop Hydrocodone  7.5/325mg  tablet. 180 tablet 0   ipratropium (ATROVENT  HFA) 17 MCG/ACT inhaler Inhale 2 puffs into the lungs every 4 (four) hours as needed (COPD). 1 each 4   isosorbide  dinitrate (ISORDIL ) 30 MG tablet Take 1 tablet (30 mg total) by mouth daily. 90 tablet 1   JARDIANCE  10 MG TABS tablet TAKE 1 TABLET(10 MG) BY MOUTH DAILY BEFORE BREAKFAST 30 tablet 5   Lancets Misc. (ACCU-CHEK SOFTCLIX LANCET DEV) KIT USE TO CHECK GLUCOSE THREE TIMES DAILY 1 kit 0   LORazepam  (ATIVAN ) 0.5 MG tablet TAKE 1 TABLET(0.5 MG) BY MOUTH THREE TIMES DAILY AS NEEDED FOR ANXIETY 90 tablet 3   Magnesium  Hydroxide (MILK OF MAGNESIA PO) Take by mouth. Takes as needed     meclizine  (ANTIVERT ) 25 MG tablet TAKE 1 TABLET(25 MG) BY MOUTH THREE TIMES DAILY AS NEEDED FOR DIZZINESS 30 tablet 1    methocarbamol  (ROBAXIN ) 500 MG tablet TAKE 1 TABLET(500 MG) BY MOUTH EVERY 8 HOURS AS NEEDED FOR MUSCLE SPASMS 30 tablet 2   Misc. Devices KIT Henmnii rollator walker 1 kit 0   montelukast  (SINGULAIR )  10 MG tablet TAKE 1 TABLET(10 MG) BY MOUTH AT BEDTIME 90 tablet 3   MOUNJARO  5 MG/0.5ML Pen ADMINISTER 5 MG UNDER THE SKIN 1 TIME A WEEK 6 mL 1   nitroGLYCERIN  (NITROSTAT ) 0.4 MG SL tablet Place 1 tablet (0.4 mg total) under the tongue every 5 (five) minutes as needed for chest pain. 90 tablet 3   omeprazole  (PRILOSEC) 40 MG capsule Take 1 capsule (40 mg total) by mouth 2 (two) times daily. 180 capsule 3   ondansetron  (ZOFRAN -ODT) 8 MG disintegrating tablet DISSOLVE 1 TABLET EVERY 8 HOURS AS NEEDED FOR FOR NAUSEA AND VOMITING 257 tablet 1   STIOLTO RESPIMAT  2.5-2.5 MCG/ACT AERS INHALE 2 PUFFS INTO THE LUNGS DAILY 4 g 11   Vitamin D , Ergocalciferol , (DRISDOL ) 1.25 MG (50000 UNIT) CAPS capsule TAKE 1 CAPSULE BY MOUTH EVERY 7 DAYS 12 capsule 1   XIFAXAN  550 MG TABS tablet TAKE 1 TABLET(550 MG) BY MOUTH THREE TIMES DAILY FOR 14 DAYS (Patient not taking: Reported on 08/21/2024) 42 tablet 0   No current facility-administered medications for this visit.    Allergies as of 08/21/2024 - Review Complete 08/21/2024  Allergen Reaction Noted   Sulfa antibiotics Shortness Of Breath, Swelling, and Rash 06/08/2011   Chicken allergy Diarrhea and Nausea And Vomiting 09/13/2023   Clindamycin /lincomycin Itching and Nausea And Vomiting 05/19/2022   Dilaudid  [hydromorphone  hcl]  10/31/2013   Ferrlecit [na ferric gluc cplx in sucrose] Itching 07/20/2023   Iron   08/31/2020   Metronidazole Diarrhea and Nausea And Vomiting 03/03/2015   Other  09/13/2023   Prednisone  Other (See Comments) 03/03/2015   Statins Other (See Comments) 10/07/2021   Tuna oil [fish oil] Diarrhea and Nausea And Vomiting 09/13/2023   Budesonide  Nausea And Vomiting and Rash 09/13/2023   Cephalexin Diarrhea and Nausea And Vomiting 03/03/2015    Methotrexate and trimetrexate Swelling and Rash 06/08/2011   Morphine  and codeine Itching and Anxiety 11/07/2013   Tomato Itching and Rash 09/13/2023    Family History  Problem Relation Age of Onset   Lung cancer Mother    Cancer Mother    Miscarriages / Stillbirths Mother    Varicose Veins Mother    Hypertension Mother    Anxiety disorder Mother    Depression Mother    COPD Father    Lung cancer Father    Lymphoma Father    Alcohol  abuse Father    Arthritis Father    Cancer Father    Anxiety disorder Sister    Anxiety disorder Sister    Intellectual disability Sister    Depression Brother    Inflammatory bowel disease Neg Hx    Colon cancer Neg Hx     Social History   Socioeconomic History   Marital status: Widowed    Spouse name: Not on file   Number of children: Not on file   Years of education: Not on file   Highest education level: Some college, no degree  Occupational History   Not on file  Tobacco Use   Smoking status: Former    Current packs/day: 0.00    Average packs/day: 1 pack/day for 49.0 years (49.0 ttl pk-yrs)    Types: Cigarettes    Start date: 08/08/1966    Quit date: 08/31/2005    Years since quitting: 18.9   Smokeless tobacco: Never   Tobacco comments:    It has been 16 years since I smoked a cigarette!!!  Vaping Use   Vaping status: Never Used  Substance and Sexual Activity  Alcohol  use: No   Drug use: No    Comment: used marijuana in teens   Sexual activity: Not Currently    Birth control/protection: Abstinence, Surgical  Other Topics Concern   Not on file  Social History Narrative   Are you right handed or left handed? Left Handed    Are you currently employed ? Retired    What is your current occupation?   Do you live at home alone? Yes   Who lives with you?    What type of home do you live in: 1 story or 2 story? Lives in a one story home        Social Drivers of Health   Tobacco Use: Medium Risk (08/21/2024)   Patient History     Smoking Tobacco Use: Former    Smokeless Tobacco Use: Never    Passive Exposure: Not on file  Financial Resource Strain: High Risk (06/07/2024)   Overall Financial Resource Strain (CARDIA)    Difficulty of Paying Living Expenses: Hard  Food Insecurity: Food Insecurity Present (06/07/2024)   Epic    Worried About Programme Researcher, Broadcasting/film/video in the Last Year: Often true    Ran Out of Food in the Last Year: Often true  Transportation Needs: No Transportation Needs (06/07/2024)   Epic    Lack of Transportation (Medical): No    Lack of Transportation (Non-Medical): No  Physical Activity: Insufficiently Active (06/07/2024)   Exercise Vital Sign    Days of Exercise per Week: 3 days    Minutes of Exercise per Session: 30 min  Stress: Stress Concern Present (06/07/2024)   [employer] of Occupational Health - Occupational Stress Questionnaire    Feeling of Stress: To some extent  Social Connections: Unknown (06/07/2024)   Social Connection and Isolation Panel    Frequency of Communication with Friends and Family: More than three times a week    Frequency of Social Gatherings with Friends and Family: Never    Attends Religious Services: More than 4 times per year    Active Member of [employer] or Organizations: Patient declined    Attends Banker Meetings: Not on file    Marital Status: Widowed  Intimate Partner Violence: Not At Risk (04/23/2024)   Epic    Fear of Current or Ex-Partner: No    Emotionally Abused: No    Physically Abused: No    Sexually Abused: No  Depression (PHQ2-9): Low Risk (07/22/2024)   Depression (PHQ2-9)    PHQ-2 Score: 0  Alcohol  Screen: Low Risk (04/23/2024)   Alcohol  Screen    Last Alcohol  Screening Score (AUDIT): 0  Housing: Low Risk (06/07/2024)   Epic    Unable to Pay for Housing in the Last Year: No    Number of Times Moved in the Last Year: 0    Homeless in the Last Year: No  Utilities: Not At Risk (04/23/2024)   Epic    Threatened with loss  of utilities: No  Health Literacy: Adequate Health Literacy (04/23/2024)   B1300 Health Literacy    Frequency of need for help with medical instructions: Never     Review of Systems   Gen: Denies any fever, chills, fatigue, weight loss, lack of appetite.  CV: Denies chest pain, heart palpitations, peripheral edema, syncope.  Resp: Denies shortness of breath at rest or with exertion. Denies wheezing or cough.  GI: Denies dysphagia or odynophagia. Denies jaundice, hematemesis, fecal incontinence. GU : Denies urinary burning, urinary frequency, urinary hesitancy MS:  Denies joint pain, muscle weakness, cramps, or limitation of movement.  Derm: Denies rash, itching, dry skin Psych: Denies depression, anxiety, memory loss, and confusion Heme: Denies bruising, bleeding, and enlarged lymph nodes.   Physical Exam   BP 130/79   Pulse 99   Temp 98.6 F (37 C)   Ht 5' 8 (1.727 m)   Wt 147 lb 9.6 oz (67 kg)   BMI 22.44 kg/m  General:   Alert and oriented. Pleasant and cooperative. Well-nourished and well-developed.  Head:  Normocephalic and atraumatic. Eyes:  Without icterus Abdomen:  +BS, soft, non-tender and non-distended. No HSM noted. No guarding or rebound. No masses appreciated.  Rectal:  Deferred  Msk:  Symmetrical without gross deformities. Normal posture. Extremities:  Without edema. Neurologic:  Alert and  oriented x4;  grossly normal neurologically. Skin:  Intact without significant lesions or rashes. Psych:  Alert and cooperative. Normal mood and affect.   Assessment   Hx of IDA, reported hx of Crohn's but negative EGD/colonoscopy, and capsule. Continues to follow with Hematology  Positive AMA: suspect PBC, thorough evaluation as above. Will start Urso  and if no improvement in lab indices will need biopsy. Fixed finances may pose a challenge and may need to be creative with alternative therapies if Urso  is too expensive. No signs of advanced liver disease.   Chronic  diarrhea: at baseline for patient and declining significant evaluation. Suspect multifactorial with IBS, likely bile-salt diarrhea component, could have underlying EPI, reported Crohn's in past but endoscopic evaluations negative for this. As finances are fixed ,will at least empirically try pancreatic enzymes and if improved, could hopefully obtain patient assistance. Otherwise, can continue with dicyclomine  prn.    PLAN    Creon  36,000 TID with food Urso  500 mg BID Contact us  if unable to afford Urso . Could try Livdelzi with patient assistance, especially in light of chronic pruritus and significant improvement with reported pruritus in studies Close follow-up in 6-8 weeks and will need ongoing discussion for presumed PBC including vitamins, DEXA, etc   Therisa MICAEL Stager, PhD, [facility] Gastroenterology    "

## 2024-08-21 NOTE — Patient Instructions (Signed)
 I am starting Creon  for pancreas enzymes to hopefully help with some of your symptoms. You will take one capsule with food (while you are eating, no before or after), three times a day. We can increase this if needed.  I sent in Urso  to start taking twice a day with food. If this is costly, we will work on a patient assistance medication.  We will see you in 6-8 weeks!  I enjoyed seeing you again today! I value our relationship and want to provide genuine, compassionate, and quality care. You may receive a survey regarding your visit with me, and I welcome your feedback! Thanks so much for taking the time to complete this. I look forward to seeing you again.      Therisa MICAEL Stager, PhD, ANP-BC [facility] Gastroenterology

## 2024-08-22 ENCOUNTER — Encounter: Payer: Self-pay | Admitting: Internal Medicine

## 2024-08-22 ENCOUNTER — Other Ambulatory Visit: Payer: Self-pay | Admitting: Internal Medicine

## 2024-08-22 ENCOUNTER — Telehealth: Admitting: Internal Medicine

## 2024-08-22 ENCOUNTER — Other Ambulatory Visit: Payer: Self-pay | Admitting: *Deleted

## 2024-08-22 DIAGNOSIS — J9611 Chronic respiratory failure with hypoxia: Secondary | ICD-10-CM | POA: Diagnosis not present

## 2024-08-22 DIAGNOSIS — J449 Chronic obstructive pulmonary disease, unspecified: Secondary | ICD-10-CM | POA: Diagnosis not present

## 2024-08-22 DIAGNOSIS — J111 Influenza due to unidentified influenza virus with other respiratory manifestations: Secondary | ICD-10-CM

## 2024-08-22 DIAGNOSIS — M5030 Other cervical disc degeneration, unspecified cervical region: Secondary | ICD-10-CM

## 2024-08-22 MED ORDER — OSELTAMIVIR PHOSPHATE 75 MG PO CAPS: 75.0000 mg | ORAL_CAPSULE | Freq: Two times a day (BID) | ORAL | 0 refills | Status: AC

## 2024-08-22 NOTE — Patient Instructions (Signed)
 Visit Information  Thank you for taking time to visit with me today. Please don't hesitate to contact me if I can be of assistance to you before our next scheduled appointment.  Your next care management appointment is by telephone on 09-23-2024 at 10:30 am  Telephone follow-up in 1 month  Please call the care guide team at [phone] if you need to cancel, schedule, or reschedule an appointment.   Please call the Suicide and Crisis Lifeline: 988 call the [place]  National Suicide Prevention Lifeline: (737)062-2349 or TTY: (313)494-2508 TTY 209-115-2897) to talk to a trained counselor call 1-800-273-TALK (toll free, 24 hour hotline) if you are experiencing a Mental Health or Behavioral Health Crisis or need someone to talk to.  Rosina Forte, BSN RN [facility], [facility] RN Care Manager Direct Dial: [phone]  Fax: [phone]

## 2024-08-22 NOTE — Patient Outreach (Signed)
 Complex Care Management   Visit Note  08/22/2024  Name:  Kayla Ryan MRN: [id number] DOB: 29-Aug-1955  Situation: Referral received for Complex Care Management related to COPD I obtained verbal consent from Patient.  Visit completed with Patient  on the phone  Background:   Past Medical History:  Diagnosis Date   Allergy Unknown   Anemia    Anxiety 1883   Arthritis    Asthma 1980   CHF (congestive heart failure) (HCC) 2021   Chronic bronchitis    Crohn disease (HCC)    Emphysema    Emphysema of lung (HCC) 2003   Fibromyalgia    GERD (gastroesophageal reflux disease)    GI bleed    Herpes    History of blood transfusion    Hyperlipidemia    Hypertension 2015   IBS (irritable bowel syndrome)    Kidney failure    Migraines    Muscular dystrophy (HCC)    Neck pain    Oxygen  deficiency 2021   PAF (paroxysmal atrial fibrillation) (HCC)    Not anticoagulated with history of severe GI bleeding   Plantar fasciitis    Polymyositis (HCC)    PONV (postoperative nausea and vomiting)    Right thyroid  nodule 04/07/2022   Scoliosis    Type 2 diabetes mellitus (HCC)     Assessment: Patient Reported Symptoms:  Cognitive Cognitive Status: No symptoms reported Cognitive/Intellectual Conditions Management [RPT]: None reported or documented in medical history or problem list   Health Maintenance Behaviors: Annual physical exam Healing Pattern: Average Health Facilitated by: Rest  Neurological Neurological Review of Symptoms: Dizziness, Headaches, Hearing changes, Vision changes, Numbness, Weakness Neurological Management Strategies: Coping strategies Neurological Self-Management Outcome: 1 (very bad)  HEENT HEENT Symptoms Reported: Nosebleed, Sore throat HEENT Management Strategies: Routine screening HEENT Self-Management Outcome: 2 (bad)    Cardiovascular Cardiovascular Symptoms Reported: Lightheadness, Palpitations Does patient have uncontrolled Hypertension?:  No Cardiovascular Management Strategies: Coping strategies Cardiovascular Self-Management Outcome: 3 (uncertain)  Respiratory Respiratory Symptoms Reported: Productive cough, Wheezing, Chest tightness Respiratory Management Strategies: Coping strategies Respiratory Self-Management Outcome: 2 (bad)  Endocrine Endocrine Symptoms Reported: No symptoms reported Is patient diabetic?: No Is patient checking blood sugars at home?: No Endocrine Self-Management Outcome: 4 (good)  Gastrointestinal Gastrointestinal Symptoms Reported: Abdominal pain or discomfort, Diarrhea Gastrointestinal Management Strategies: Coping strategies, Medication therapy Gastrointestinal Self-Management Outcome: 3 (uncertain)    Genitourinary Genitourinary Symptoms Reported: No symptoms reported Genitourinary Self-Management Outcome: 4 (good)  Integumentary Integumentary Symptoms Reported: No symptoms reported Skin Self-Management Outcome: 4 (good)  Musculoskeletal Musculoskelatal Symptoms Reviewed: Joint pain, Weakness Musculoskeletal Management Strategies: Routine screening Musculoskeletal Self-Management Outcome: 4 (good) Falls in the past year?: Yes Number of falls in past year: 1 or less Was there an injury with Fall?: Yes Fall Risk Category Calculator: 2 Patient Fall Risk Level: Moderate Fall Risk Patient at Risk for Falls Due to: History of fall(s) Fall risk Follow up: Falls evaluation completed  Psychosocial Psychosocial Symptoms Reported: No symptoms reported Behavioral Management Strategies: Coping strategies Behavioral Health Self-Management Outcome: 4 (good) Major Change/Loss/Stressor/Fears (CP): Denies      08/22/2024    PHQ2-9 Depression Screening   Little interest or pleasure in doing things Not at all  Feeling down, depressed, or hopeless Several days  PHQ-2 - Total Score 1  Trouble falling or staying asleep, or sleeping too much    Feeling tired or having little energy    Poor appetite or  overeating     Feeling bad about yourself - or  that you are a failure or have let yourself or your family down    Trouble concentrating on things, such as reading the newspaper or watching television    Moving or speaking so slowly that other people could have noticed.  Or the opposite - being so fidgety or restless that you have been moving around a lot more than usual    Thoughts that you would be better off dead, or hurting yourself in some way    PHQ2-9 Total Score    If you checked off any problems, how difficult have these problems made it for you to do your work, take care of things at home, or get along with other people    Depression Interventions/Treatment      There were no vitals filed for this visit. Pain Score: 7  Pain Type: Chronic pain Pain Location: Neck Pain Intervention(s): Medication (See eMAR)  Medications Reviewed Today     Reviewed by Bertrum Rosina HERO, RN (Registered Nurse) on 08/22/24 at 1043  Med List Status: <None>   Medication Order Taking? Sig Documenting Provider Last Dose Status Informant  Accu-Chek Softclix Lancets lancets [id number] Yes USE TO TEST BLOOD GLUCOSE THREE TIMES DAILY Patel, Rutwik K, MD  Active   acyclovir  (ZOVIRAX ) 400 MG tablet [id number] Yes TAKE 1 TABLET(400 MG) BY MOUTH THREE TIMES DAILY Tobie Suzzane POUR, MD  Active   albuterol  (PROVENTIL ) (2.5 MG/3ML) 0.083% nebulizer solution [id number] Yes Take 3 mLs (2.5 mg total) by nebulization every 6 (six) hours as needed for wheezing or shortness of breath. Shelah Lamar RAMAN, MD  Active   Alcohol  Swabs  PADS [id number] Yes Use it as directed before blood glucose check 3 times daily. Tobie Suzzane POUR, MD  Active   ASPERCREME LIDOCAINE  EX [id number] Yes Apply 1 Application topically 3 (three) times daily as needed (pain). [provider]  Active Self  azithromycin  (ZITHROMAX ) 250 MG tablet [id number] Yes Take 250 mg by mouth daily. [provider]  Active   Blood Pressure Monitoring (BLOOD  PRESSURE MONITOR 3) DEVI [id number] Yes Check blood pressure once daily 1 hour after taking blood pressure medication Tobie Suzzane POUR, MD  Active   cetirizine  (ZYRTEC ) 10 MG tablet [id number] Yes Take 10 mg by mouth 2 (two) times daily. [provider]  Active Self  Continuous Glucose Receiver (DEXCOM G7 RECEIVER) ESPIRIDION [id number] Yes USE TO CHECK BLOOD GLUCOSE AS NEEDED Tobie Suzzane POUR, MD  Active Self  Continuous Glucose Sensor (DEXCOM G7 SENSOR) MISC [id number] Yes USE TO CHECK BLOOD SUGAR THREE TIMES DAILY, BEFORE MEALS AND AT BEDTIME AND AS NEEDED. CHANGE SENSOR EVERY 10 DAYS Tobie Suzzane POUR, MD  Active   dicyclomine  (BENTYL ) 10 MG capsule [id number] Yes Take 1 capsule (10 mg total) by mouth 4 (four) times daily -  before meals and at bedtime. Monitor for constipation, dry mouth, dizziness Shirlean Therisa ORN, NP  Active   diphenhydrAMINE  (BENADRYL ) 25 MG tablet [id number] Yes Take 50 mg by mouth daily as needed for allergies or itching. [provider]  Active Self  doxepin  (SINEQUAN ) 10 MG capsule [id number] Yes TAKE 1 CAPSULE(10 MG) BY MOUTH AT BEDTIME Tobie Suzzane POUR, MD  Active   DULoxetine  (CYMBALTA ) 60 MG capsule [id number] Yes TAKE 1 CAPSULE(60 MG) BY MOUTH TWICE DAILY Patel, Rutwik K, MD  Active   ezetimibe  (ZETIA ) 10 MG tablet [id number] Yes Take 1 tablet (10 mg total) by mouth daily. Debera Jayson MATSU, MD  Active   gabapentin  (NEURONTIN ) 800 MG tablet [id number]  Yes Take 1 tablet (800 mg total) by mouth 3 (three) times daily. TAKE 1 TABLET(800 MG) BY MOUTH THREE TIMES DAILY Patel, Rutwik K, MD  Active   glucose blood (ACCU-CHEK GUIDE TEST) test strip [id number] Yes Use as instructed 3 times daily. Tobie Suzzane POUR, MD  Active   Glycerin-Hypromellose-PEG 400 (DRY EYE RELIEF DROPS OP) [id number] Yes Place 1 drop into both eyes 3 (three) times daily as needed (Dry eye). [provider]  Active Self  GVOKE HYPOPEN  2-PACK 1 MG/0.2ML EMMANUEL [id number] Yes INJECT 0.2 MLS UNDER THE SKIN AS  NEEDED BLOOD GLUCOSE<53 Tobie Suzzane POUR, MD  Active   HYDROcodone -acetaminophen  (NORCO) 10-325 MG tablet [id number] Yes Take 1 tablet by mouth every 4 (four) hours as needed. Stop Hydrocodone  7.5/325mg  tablet.   Active Self  ipratropium (ATROVENT  HFA) 17 MCG/ACT inhaler [id number] Yes Inhale 2 puffs into the lungs every 4 (four) hours as needed (COPD). Byrum, Robert S, MD  Active   isosorbide  dinitrate (ISORDIL ) 30 MG tablet [id number] Yes Take 1 tablet (30 mg total) by mouth daily. Tobie Suzzane POUR, MD  Active   JARDIANCE  10 MG TABS tablet [id number] Yes TAKE 1 TABLET(10 MG) BY MOUTH DAILY BEFORE BREAKFAST Patel, Rutwik K, MD  Active   Lancets Misc. (ACCU-CHEK SOFTCLIX LANCET DEV) KIT [id number] Yes USE TO CHECK GLUCOSE THREE TIMES DAILY Tobie Suzzane POUR, MD  Active Self  lipase/protease/amylase (CREON ) 36000 UNITS CPEP capsule [id number] Yes Take 2 capsules (72,000 Units total) by mouth 3 (three) times daily with meals. May also take 1 capsule (36,000 Units total) as needed (with snacks - up to 4 snacks daily). Shirlean Therisa ORN, NP  Active   LORazepam  (ATIVAN ) 0.5 MG tablet [id number] Yes TAKE 1 TABLET(0.5 MG) BY MOUTH THREE TIMES DAILY AS NEEDED FOR ANXIETY Patel, Rutwik K, MD  Active   Magnesium  Hydroxide (MILK OF MAGNESIA PO) [id number] Yes Take by mouth. Takes as needed [provider]  Active   meclizine  (ANTIVERT ) 25 MG tablet [id number] Yes TAKE 1 TABLET(25 MG) BY MOUTH THREE TIMES DAILY AS NEEDED FOR DIZZINESS Patel, Rutwik K, MD  Active   methocarbamol  (ROBAXIN ) 500 MG tablet [id number] Yes TAKE 1 TABLET(500 MG) BY MOUTH EVERY 8 HOURS AS NEEDED FOR MUSCLE SPASMS Tobie Suzzane POUR, MD  Active   Misc. Devices KIT [id number] Yes Henmnii rollator walker Tobie Suzzane POUR, MD  Active Self  montelukast  (SINGULAIR ) 10 MG tablet [id number] Yes TAKE 1 TABLET(10 MG) BY MOUTH AT BEDTIME Shelah Lamar RAMAN, MD  Active   MOUNJARO  5 MG/0.5ML Pen [id number] Yes ADMINISTER 5 MG UNDER THE SKIN 1 TIME A WEEK Tobie, Suzzane POUR,  MD  Active   nitroGLYCERIN  (NITROSTAT ) 0.4 MG SL tablet [id number] Yes Place 1 tablet (0.4 mg total) under the tongue every 5 (five) minutes as needed for chest pain. Debera Jayson MATSU, MD  Active Self  omeprazole  (PRILOSEC) 40 MG capsule [id number] Yes Take 1 capsule (40 mg total) by mouth 2 (two) times daily. Lamon Herter M, PA-C  Active   ondansetron  (ZOFRAN -ODT) 8 MG disintegrating tablet [id number] Yes DISSOLVE 1 TABLET EVERY 8 HOURS AS NEEDED FOR FOR NAUSEA AND VOMITING Rogers Hai, MD  Active   STIOLTO RESPIMAT  2.5-2.5 MCG/ACT AERS [id number] Yes INHALE 2 PUFFS INTO THE LUNGS DAILY Patel, Rutwik K, MD  Active   ursodiol  (URSO  FORTE) 500 MG tablet [id number] Yes Take 1 tablet (500 mg total) by mouth 2 (two) times daily with a meal. Shirlean Therisa ORN, NP  Active   Vitamin  D, Ergocalciferol , (DRISDOL ) 1.25 MG (50000 UNIT) CAPS capsule [id number] Yes TAKE 1 CAPSULE BY MOUTH EVERY 7 DAYS Patel, Rutwik K, MD  Active   XIFAXAN  550 MG TABS tablet [id number]  TAKE 1 TABLET(550 MG) BY MOUTH THREE TIMES DAILY FOR 14 DAYS  Patient not taking: Reported on 08/21/2024   Shirlean Therisa ORN, NP  Active             Recommendation:   Continue Current Plan of Care  Follow Up Plan:   Telephone follow-up in 1 month  Rosina Forte, BSN RN [facility], [facility] RN Care Manager Direct Dial: [phone]  Fax: [phone]

## 2024-08-22 NOTE — Assessment & Plan Note (Addendum)
 Well controlled with Stiolto and as needed albuterol  Followed by Pulmonology - on oral azithromycin  currently Has 3 lpm home O2 for chronic hypoxia

## 2024-08-22 NOTE — Progress Notes (Signed)
 "    Virtual Visit via Video Note   Because of Kayla Ryan's co-morbid illnesses, she is at least at moderate risk for complications without adequate follow up.  This format is felt to be most appropriate for this patient at this time.  All issues noted in this document were discussed and addressed.  A limited physical exam was performed with this format.      Evaluation Performed:  Follow-up visit  Date:  08/22/2024   ID:  Kayla Ryan, DOB [birth year], MRN [id number]  Patient Location: Home Provider Location: Office/Clinic  Participants: Patient Location of Patient: Home Location of Provider: Telehealth Consent was obtain for visit to be over via telehealth. I verified that I am speaking with the correct person using two identifiers.  PCP:  Tobie Suzzane POUR, MD   Chief Complaint: Cough, chills and bodyaches  History of Present Illness:    Kayla Ryan is a 69 y.o. female with PMH of HTN, HFpEF, COPD, GERD, type II DM with neuropathy, CKD, GAD and chronic pain syndrome who has a video visit for complaint of cough, recent worsening of dyspnea and wheezing for the last 2 days.  She also reports chills and bodyaches.  One of her friends visited her in the last week, who had flulike symptoms after she left her home.  She is currently taking azithromycin  for uncontrolled COPD.  She also uses Stiolto and DuoNeb for it.  Her home COVID test was negative.  The patient does have symptoms concerning for COVID-19 infection (fever, chills, cough, or new shortness of breath).   Past Medical, Surgical, Social History, Allergies, and Medications have been Reviewed.  Past Medical History:  Diagnosis Date   Allergy Unknown   Anemia    Anxiety 1883   Arthritis    Asthma 1980   CHF (congestive heart failure) (HCC) 2021   Chronic bronchitis    Crohn disease (HCC)    Emphysema    Emphysema of lung (HCC) 2003   Fibromyalgia    GERD (gastroesophageal reflux disease)    GI bleed     Herpes    History of blood transfusion    Hyperlipidemia    Hypertension 2015   IBS (irritable bowel syndrome)    Kidney failure    Migraines    Muscular dystrophy (HCC)    Neck pain    Oxygen  deficiency 2021   PAF (paroxysmal atrial fibrillation) (HCC)    Not anticoagulated with history of severe GI bleeding   Plantar fasciitis    Polymyositis (HCC)    PONV (postoperative nausea and vomiting)    Right thyroid  nodule 04/07/2022   Scoliosis    Type 2 diabetes mellitus (HCC)    Past Surgical History:  Procedure Laterality Date   ABDOMINAL HYSTERECTOMY     AGILE CAPSULE N/A 06/24/2021   Procedure: AGILE CAPSULE;  Surgeon: Cindie Carlin POUR, DO;  [facility]: [facility];  Service: Endoscopy;  Laterality: N/A;   AGILE CAPSULE N/A 07/22/2021   Procedure: AGILE CAPSULE;  Surgeon: Shaaron Lamar HERO, MD;  Location: [facility];  Service: Endoscopy;  Laterality: N/A;  7:30am   bone spur     BREAST BIOPSY Left 11/17/2016   fibrocystic changes with associated microcalcifications and a fibroadenoma   BREAST BIOPSY Left 06/06/2024   US  LT BREAST BX W LOC DEV 1ST LESION IMG BX SPEC US  GUIDE 06/06/2024 Mir, Aliene SAUNDERS, MD [facility]   BREAST BIOPSY Right 06/06/2024   US  RT BREAST BX W  LOC DEV 1ST LESION IMG BX SPEC US  GUIDE 06/06/2024 Mir, Aliene SAUNDERS, MD [facility]   BREAST BIOPSY Left 06/06/2024   US  LT BREAST BX W LOC DEV EA ADD LESION IMG BX SPEC US  GUIDE 06/06/2024 Mir, Aliene SAUNDERS, MD [facility]   BREAST BIOPSY Left 06/26/2024   MM LT BREAST BX W LOC DEV 1ST LESION IMAGE BX SPEC STEREO GUIDE 06/26/2024 [facility]   BREAST SURGERY  1995   CHOLECYSTECTOMY     COLON SURGERY  1995   COLONOSCOPY WITH PROPOFOL  N/A 04/30/2021   Procedure: COLONOSCOPY WITH PROPOFOL ;  Surgeon: Shaaron Lamar HERO, MD;  [facility];  Service: Endoscopy;  Laterality: N/A;   COSMETIC SURGERY  1980   ESOPHAGEAL DILATION N/A 04/30/2021   Procedure: ESOPHAGEAL DILATION;  Surgeon: Shaaron Lamar HERO, MD;  [facility];  Service: Endoscopy;  Laterality: N/A;   ESOPHAGEAL DILATION N/A 12/14/2023   Procedure: DILATION, ESOPHAGUS;  Surgeon: Cindie Carlin POUR, DO;  [facility];  Service: Endoscopy;  Laterality: N/A;   ESOPHAGOGASTRODUODENOSCOPY N/A 12/14/2023   Procedure: EGD (ESOPHAGOGASTRODUODENOSCOPY);  Surgeon: Cindie Carlin POUR, DO;  [facility];  Service: Endoscopy;  Laterality: N/A;  9:15 am, asa 3   ESOPHAGOGASTRODUODENOSCOPY (EGD) WITH PROPOFOL  N/A 04/30/2021   Procedure: ESOPHAGOGASTRODUODENOSCOPY (EGD) WITH PROPOFOL ;  Surgeon: Shaaron Lamar HERO, MD;  [facility];  Service: Endoscopy;  Laterality: N/A;   GIVENS CAPSULE STUDY N/A 09/22/2021   Procedure: GIVENS CAPSULE STUDY;  Surgeon: Shaaron Lamar HERO, MD;  [facility];  Service: Endoscopy;  Laterality: N/A;  7:30am   HERNIA REPAIR     LEFT HEART CATHETERIZATION WITH CORONARY ANGIOGRAM N/A 11/07/2013   Procedure: LEFT HEART CATHETERIZATION WITH CORONARY ANGIOGRAM;  Surgeon: Dorn JINNY Lesches, MD;  Location: [facility];  Service: Cardiovascular;  Laterality: N/A;   MASTECTOMY PARTIAL / LUMPECTOMY     OOPHORECTOMY     ROTATOR CUFF REPAIR     TOOTH EXTRACTION N/A 04/15/2022   Procedure: DENTAL RESTORATION/EXTRACTIONS;  Surgeon: Sheryle Hamilton, DMD;  Location: [facility];  Service: Oral Surgery;  Laterality: N/A;   TOOTH EXTRACTION N/A 09/15/2023   Procedure: DENTAL RESTORATION/EXTRACTIONS;  Surgeon: Sheryle Hamilton, DMD;  Location: [facility];  Service: Oral Surgery;  Laterality: N/A;     Active Medications[1]   Allergies:   Sulfa antibiotics, Chicken allergy, Clindamycin /lincomycin, Dilaudid  [hydromorphone  hcl], Ferrlecit [na ferric gluc cplx in sucrose], Iron , Metronidazole, Other, Prednisone , Statins, Tuna oil [fish oil], Budesonide , Cephalexin, Methotrexate and trimetrexate, Morphine  and codeine, and Tomato   ROS:   Please see the history of present illness. All other systems reviewed  and are negative.   Labs/Other Tests and Data Reviewed:    Recent Labs: 05/24/2024: ALT 50 07/30/2024: TSH 5.21 08/02/2024: BUN 21; Creatinine, Ser 0.86; Hemoglobin 13.9; Magnesium  2.5; Platelets 421; Potassium 4.2; Pro Brain Natriuretic Peptide 174.0; Sodium 139   Recent Lipid Panel Lab Results  Component Value Date/Time   CHOL 202 (H) 01/23/2024 01:38 PM   TRIG 529 (H) 01/23/2024 01:38 PM   HDL 35 (L) 01/23/2024 01:38 PM   CHOLHDL 5.8 (H) 01/23/2024 01:38 PM   LDLCALC 82 01/23/2024 01:38 PM    Wt Readings from Last 3 Encounters:  08/21/24 147 lb 9.6 oz (67 kg)  08/02/24 149 lb (67.6 kg)  07/30/24 151 lb (68.5 kg)     Objective:    Vital Signs:  There were no vitals taken for this visit.   VITAL SIGNS:  reviewed GEN:  no acute distress EYES:  sclerae anicteric, EOMI - Extraocular Movements Intact RESPIRATORY:  Has home O2.  Able to speak in full sentences. NEURO:  alert and oriented x 3, no obvious focal deficit PSYCH:  normal affect  ASSESSMENT & PLAN:    Assessment & Plan Influenza Started Tamiflu  empirically due to her symptoms Advised to maintain adequate hydration and eat at regular intervals Chronic obstructive pulmonary disease, unspecified COPD type (HCC) Well controlled with Stiolto and as needed albuterol  Followed by Pulmonology - on oral azithromycin  currently Has 3 lpm home O2 for chronic hypoxia Chronic respiratory failure with hypoxia (HCC) Due to COPD Followed by Pulmonology Has 3 lpm home O2 for chronic hypoxia    I discussed the assessment and treatment plan with the patient. The patient was provided an opportunity to ask questions, and all were answered. The patient agreed with the plan and demonstrated an understanding of the instructions.   The patient was advised to call back or seek an in-person evaluation if the symptoms worsen or if the condition fails to improve as anticipated.  The above assessment and management plan was discussed  with the patient. The patient verbalized understanding of and has agreed to the management plan.   Medication Adjustments/Labs and Tests Ordered: Current medicines are reviewed at length with the patient today.  Concerns regarding medicines are outlined above.   Tests Ordered: No orders of the defined types were placed in this encounter.   Medication Changes: Meds ordered this encounter  Medications   oseltamivir  (TAMIFLU ) 75 MG capsule    Sig: Take 1 capsule (75 mg total) by mouth 2 (two) times daily.    Dispense:  10 capsule    Refill:  0     Note: This dictation was prepared with Dragon dictation along with smaller phrase technology. Similar sounding words can be transcribed inadequately or may not be corrected upon review. Any transcriptional errors that result from this process are unintentional.      Disposition:  Follow up  Signed, Suzzane MARLA Blanch, MD  08/22/2024 3:11 PM     [facility]    [1]  Current Meds  Medication Sig   Accu-Chek Softclix Lancets lancets USE TO TEST BLOOD GLUCOSE THREE TIMES DAILY   acyclovir  (ZOVIRAX ) 400 MG tablet TAKE 1 TABLET(400 MG) BY MOUTH THREE TIMES DAILY   albuterol  (PROVENTIL ) (2.5 MG/3ML) 0.083% nebulizer solution Take 3 mLs (2.5 mg total) by nebulization every 6 (six) hours as needed for wheezing or shortness of breath.   Alcohol  Swabs  PADS Use it as directed before blood glucose check 3 times daily.   ASPERCREME LIDOCAINE  EX Apply 1 Application topically 3 (three) times daily as needed (pain).   azithromycin  (ZITHROMAX ) 250 MG tablet Take 250 mg by mouth daily.   Blood Pressure Monitoring (BLOOD PRESSURE MONITOR 3) DEVI Check blood pressure once daily 1 hour after taking blood pressure medication   cetirizine  (ZYRTEC ) 10 MG tablet Take 10 mg by mouth 2 (two) times daily.   Continuous Glucose Receiver (DEXCOM G7 RECEIVER) DEVI USE TO CHECK BLOOD GLUCOSE AS NEEDED   Continuous Glucose Sensor  (DEXCOM G7 SENSOR) MISC USE TO CHECK BLOOD SUGAR THREE TIMES DAILY, BEFORE MEALS AND AT BEDTIME AND AS NEEDED. CHANGE SENSOR EVERY 10 DAYS   dicyclomine  (BENTYL ) 10 MG capsule Take 1 capsule (10 mg total) by mouth 4 (four) times daily -  before meals and at bedtime. Monitor for constipation, dry mouth, dizziness   diphenhydrAMINE  (  BENADRYL ) 25 MG tablet Take 50 mg by mouth daily as needed for allergies or itching.   doxepin  (SINEQUAN ) 10 MG capsule TAKE 1 CAPSULE(10 MG) BY MOUTH AT BEDTIME   DULoxetine  (CYMBALTA ) 60 MG capsule TAKE 1 CAPSULE(60 MG) BY MOUTH TWICE DAILY   ezetimibe  (ZETIA ) 10 MG tablet Take 1 tablet (10 mg total) by mouth daily.   gabapentin  (NEURONTIN ) 800 MG tablet Take 1 tablet (800 mg total) by mouth 3 (three) times daily. TAKE 1 TABLET(800 MG) BY MOUTH THREE TIMES DAILY   glucose blood (ACCU-CHEK GUIDE TEST) test strip Use as instructed 3 times daily.   Glycerin-Hypromellose-PEG 400 (DRY EYE RELIEF DROPS OP) Place 1 drop into both eyes 3 (three) times daily as needed (Dry eye).   GVOKE HYPOPEN  2-PACK 1 MG/0.2ML SOAJ INJECT 0.2 MLS UNDER THE SKIN AS NEEDED BLOOD GLUCOSE<53   HYDROcodone -acetaminophen  (NORCO) 10-325 MG tablet Take 1 tablet by mouth every 4 (four) hours as needed. Stop Hydrocodone  7.5/325mg  tablet.   ipratropium (ATROVENT  HFA) 17 MCG/ACT inhaler Inhale 2 puffs into the lungs every 4 (four) hours as needed (COPD).   isosorbide  dinitrate (ISORDIL ) 30 MG tablet Take 1 tablet (30 mg total) by mouth daily.   JARDIANCE  10 MG TABS tablet TAKE 1 TABLET(10 MG) BY MOUTH DAILY BEFORE BREAKFAST   Lancets Misc. (ACCU-CHEK SOFTCLIX LANCET DEV) KIT USE TO CHECK GLUCOSE THREE TIMES DAILY   lipase/protease/amylase (CREON ) 36000 UNITS CPEP capsule Take 2 capsules (72,000 Units total) by mouth 3 (three) times daily with meals. May also take 1 capsule (36,000 Units total) as needed (with snacks - up to 4 snacks daily).   LORazepam  (ATIVAN ) 0.5 MG tablet TAKE 1 TABLET(0.5 MG) BY MOUTH  THREE TIMES DAILY AS NEEDED FOR ANXIETY   Magnesium  Hydroxide (MILK OF MAGNESIA PO) Take by mouth. Takes as needed   meclizine  (ANTIVERT ) 25 MG tablet TAKE 1 TABLET(25 MG) BY MOUTH THREE TIMES DAILY AS NEEDED FOR DIZZINESS   methocarbamol  (ROBAXIN ) 500 MG tablet TAKE 1 TABLET(500 MG) BY MOUTH EVERY 8 HOURS AS NEEDED FOR MUSCLE SPASMS   Misc. Devices KIT Henmnii rollator walker   montelukast  (SINGULAIR ) 10 MG tablet TAKE 1 TABLET(10 MG) BY MOUTH AT BEDTIME   MOUNJARO  5 MG/0.5ML Pen ADMINISTER 5 MG UNDER THE SKIN 1 TIME A WEEK   nitroGLYCERIN  (NITROSTAT ) 0.4 MG SL tablet Place 1 tablet (0.4 mg total) under the tongue every 5 (five) minutes as needed for chest pain.   omeprazole  (PRILOSEC) 40 MG capsule Take 1 capsule (40 mg total) by mouth 2 (two) times daily.   ondansetron  (ZOFRAN -ODT) 8 MG disintegrating tablet DISSOLVE 1 TABLET EVERY 8 HOURS AS NEEDED FOR FOR NAUSEA AND VOMITING   oseltamivir  (TAMIFLU ) 75 MG capsule Take 1 capsule (75 mg total) by mouth 2 (two) times daily.   STIOLTO RESPIMAT  2.5-2.5 MCG/ACT AERS INHALE 2 PUFFS INTO THE LUNGS DAILY   ursodiol  (URSO  FORTE) 500 MG tablet Take 1 tablet (500 mg total) by mouth 2 (two) times daily with a meal.   Vitamin D , Ergocalciferol , (DRISDOL ) 1.25 MG (50000 UNIT) CAPS capsule TAKE 1 CAPSULE BY MOUTH EVERY 7 DAYS   XIFAXAN  550 MG TABS tablet TAKE 1 TABLET(550 MG) BY MOUTH THREE TIMES DAILY FOR 14 DAYS   [DISCONTINUED] azithromycin  (ZITHROMAX ) 250 MG tablet Take 250 mg by mouth daily.   "

## 2024-08-22 NOTE — Assessment & Plan Note (Addendum)
 Started Tamiflu  empirically due to her symptoms Advised to maintain adequate hydration and eat at regular intervals

## 2024-08-22 NOTE — Assessment & Plan Note (Addendum)
Due to COPD Followed by Pulmonology Has 3 lpm home O2 for chronic hypoxia 

## 2024-08-22 NOTE — Patient Instructions (Addendum)
 Please start taking Tamiflu  as prescribed.  Please continue taking azithromycin  as prescribed by your pulmonologist.  Please continue using Stiolto regularly and use DuoNeb as needed for shortness of breath or wheezing.

## 2024-08-28 ENCOUNTER — Other Ambulatory Visit: Payer: Self-pay | Admitting: Internal Medicine

## 2024-08-30 ENCOUNTER — Other Ambulatory Visit: Payer: Self-pay | Admitting: Internal Medicine

## 2024-08-30 DIAGNOSIS — R42 Dizziness and giddiness: Secondary | ICD-10-CM

## 2024-08-31 NOTE — Therapy (Incomplete)
 " OUTPATIENT PHYSICAL THERAPY CERVICAL EVALUATION   Patient Name: Kayla Ryan MRN: [id number] DOB:October 11, 1955, 69 y.o., female Today's Date: 08/31/2024  END OF SESSION:   Past Medical History:  Diagnosis Date   Allergy Unknown   Anemia    Anxiety 1883   Arthritis    Asthma 1980   CHF (congestive heart failure) (HCC) 2021   Chronic bronchitis    Crohn disease (HCC)    Emphysema    Emphysema of lung (HCC) 2003   Fibromyalgia    GERD (gastroesophageal reflux disease)    GI bleed    Herpes    History of blood transfusion    Hyperlipidemia    Hypertension 2015   IBS (irritable bowel syndrome)    Kidney failure    Migraines    Muscular dystrophy (HCC)    Neck pain    Oxygen  deficiency 2021   PAF (paroxysmal atrial fibrillation) (HCC)    Not anticoagulated with history of severe GI bleeding   Plantar fasciitis    Polymyositis (HCC)    PONV (postoperative nausea and vomiting)    Right thyroid  nodule 04/07/2022   Scoliosis    Type 2 diabetes mellitus (HCC)    Past Surgical History:  Procedure Laterality Date   ABDOMINAL HYSTERECTOMY     AGILE CAPSULE N/A 06/24/2021   Procedure: AGILE CAPSULE;  Surgeon: Cindie Carlin POUR, DO;  [facility];  Service: Endoscopy;  Laterality: N/A;   AGILE CAPSULE N/A 07/22/2021   Procedure: AGILE CAPSULE;  Surgeon: Shaaron Lamar HERO, MD;  Location: [facility];  Service: Endoscopy;  Laterality: N/A;  7:30am   bone spur     BREAST BIOPSY Left 11/17/2016   fibrocystic changes with associated microcalcifications and a fibroadenoma   BREAST BIOPSY Left 06/06/2024   US  LT BREAST BX W LOC DEV 1ST LESION IMG BX SPEC US  GUIDE 06/06/2024 Mir, Aliene SAUNDERS, MD [facility]   BREAST BIOPSY Right 06/06/2024   US  RT BREAST BX W LOC DEV 1ST LESION IMG BX SPEC US  GUIDE 06/06/2024 Mir, Aliene SAUNDERS, MD [facility]   BREAST BIOPSY Left 06/06/2024   US  LT BREAST BX W LOC DEV EA ADD LESION IMG BX SPEC US  GUIDE 06/06/2024 Mir, Aliene SAUNDERS, MD  [facility]   BREAST BIOPSY Left 06/26/2024   MM LT BREAST BX W LOC DEV 1ST LESION IMAGE BX SPEC STEREO GUIDE 06/26/2024 [facility]   BREAST SURGERY  1995   CHOLECYSTECTOMY     COLON SURGERY  1995   COLONOSCOPY WITH PROPOFOL  N/A 04/30/2021   Procedure: COLONOSCOPY WITH PROPOFOL ;  Surgeon: Shaaron Lamar HERO, MD;  [facility];  Service: Endoscopy;  Laterality: N/A;   COSMETIC SURGERY  1980   ESOPHAGEAL DILATION N/A 04/30/2021   Procedure: ESOPHAGEAL DILATION;  Surgeon: Shaaron Lamar HERO, MD;  [facility];  Service: Endoscopy;  Laterality: N/A;   ESOPHAGEAL DILATION N/A 12/14/2023   Procedure: DILATION, ESOPHAGUS;  Surgeon: Cindie Carlin POUR, DO;  [facility];  Service: Endoscopy;  Laterality: N/A;   ESOPHAGOGASTRODUODENOSCOPY N/A 12/14/2023   Procedure: EGD (ESOPHAGOGASTRODUODENOSCOPY);  Surgeon: Cindie Carlin POUR, DO;  [facility];  Service: Endoscopy;  Laterality: N/A;  9:15 am, asa 3   ESOPHAGOGASTRODUODENOSCOPY (EGD) WITH PROPOFOL  N/A 04/30/2021   Procedure: ESOPHAGOGASTRODUODENOSCOPY (EGD) WITH PROPOFOL ;  Surgeon: Shaaron Lamar HERO, MD;  [facility];  Service: Endoscopy;  Laterality: N/A;   GIVENS CAPSULE STUDY N/A 09/22/2021   Procedure: GIVENS CAPSULE STUDY;  Surgeon: Shaaron,  Lamar HERO, MD;  [facility];  Service: Endoscopy;  Laterality: N/A;  7:30am   HERNIA REPAIR     LEFT HEART CATHETERIZATION WITH CORONARY ANGIOGRAM N/A 11/07/2013   Procedure: LEFT HEART CATHETERIZATION WITH CORONARY ANGIOGRAM;  Surgeon: Dorn JINNY Lesches, MD;  Location: [facility];  Service: Cardiovascular;  Laterality: N/A;   MASTECTOMY PARTIAL / LUMPECTOMY     OOPHORECTOMY     ROTATOR CUFF REPAIR     TOOTH EXTRACTION N/A 04/15/2022   Procedure: DENTAL RESTORATION/EXTRACTIONS;  Surgeon: Sheryle Hamilton, DMD;  Location: [facility];  Service: Oral Surgery;  Laterality: N/A;   TOOTH EXTRACTION N/A 09/15/2023   Procedure: DENTAL  RESTORATION/EXTRACTIONS;  Surgeon: Sheryle Hamilton, DMD;  Location: [facility];  Service: Oral Surgery;  Laterality: N/A;   Patient Active Problem List   Diagnosis Date Noted   Influenza 08/22/2024   Elevated LFTs 08/21/2024   Diarrhea 08/21/2024   Resting tremor 06/11/2024   Encounter for examination following treatment at hospital 06/11/2024   Hepatic steatosis 04/18/2024   Elevated TSH 02/06/2024   Acute pain of right knee 02/06/2024   Vitamin D  deficiency 02/06/2024   History of Crohn's disease 05/01/2023   Myopathy 05/01/2023   Atherosclerosis of aorta 04/13/2023   Trigeminal neuralgia 04/11/2023   Statin myopathy 12/08/2022   Physical deconditioning 11/04/2022   Umbilical hernia without obstruction and without gangrene 11/01/2022   Lipoma of neck 11/01/2022   History of cystocele 11/01/2022   Chronic prescription benzodiazepine use 06/03/2022   DDD (degenerative disc disease), cervical 04/13/2022   Vertigo 04/07/2022   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   Pituitary microadenoma (HCC) 11/10/2021   Thyroid  nodule 11/10/2021   AVM (arteriovenous malformation) of small bowel, acquired    Chronic respiratory failure with hypoxia (HCC) 01/28/2021   Pulmonary nodules 07/24/2020   (HFpEF) heart failure with preserved ejection fraction (HCC) 07/24/2020   Trigger finger, acquired 11/03/2017   Chronic obstructive pulmonary disease (HCC) 08/23/2017   Chronic pain 08/23/2017   Gastroesophageal reflux disease without esophagitis 05/22/2017   Primary insomnia 02/01/2017   Dysphagia 10/06/2016   Essential hypertension 07/17/2016   Iron  deficiency anemia secondary to blood loss (chronic) 03/25/2016   History of Bell's palsy 03/03/2015   Hemifacial spasm 03/03/2015   Allergic rhinitis 05/18/2014   GAD (generalized anxiety disorder) 05/18/2014   Arthritis 05/18/2014   Asthma 05/18/2014   Chronic idiopathic constipation 05/18/2014   Dermatomyositis  (HCC) 05/18/2014   Migraine 05/18/2014   Rectocele 05/18/2014   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   Peripheral neuropathy 05/18/2014   Right-sided Bell's palsy 04/29/2014   Angina pectoris 10/30/2013   Hyperlipidemia 10/30/2013   Carotid artery disease 10/30/2013    PCP: Tobie Suzzane POUR, MD   REFERRING PROVIDER: Patel, Donika K, DO  REFERRING DIAG: R25.1 (ICD-10-CM) - Tremor M54.2 (ICD-10-CM) - Cervicalgia  THERAPY DIAG:  No diagnosis found.  Rationale for Evaluation and Treatment: Rehabilitation  ONSET DATE: ***  SUBJECTIVE:  SUBJECTIVE STATEMENT: *** Hand dominance: {MISC; OT HAND DOMINANCE:[phone]}  PERTINENT HISTORY:  ***  PAIN:  Are you having pain? {OPRCPAIN:27236}  PRECAUTIONS: {Therapy precautions:24002}  RED FLAGS: {PT Red Flags:29287}     WEIGHT BEARING RESTRICTIONS: {Yes ***/No:24003}  FALLS:  Has patient fallen in last 6 months? {fallsyesno:27318}  LIVING ENVIRONMENT: Lives with: {OPRC lives with:25569::lives with their family} Lives in: {Lives in:25570} Stairs: {opstairs:27293} Has following equipment at home: {Assistive devices:23999}  OCCUPATION: ***  PLOF: {PLOF:24004}  PATIENT GOALS: ***  NEXT MD VISIT: ***  OBJECTIVE:  Note: Objective measures were completed at Evaluation unless otherwise noted.  DIAGNOSTIC FINDINGS:  CLINICAL DATA:  Ataxia, fell   EXAM: CT CERVICAL SPINE WITHOUT CONTRAST   TECHNIQUE: Multidetector CT imaging of the cervical spine was performed without intravenous contrast. Multiplanar CT image reconstructions were also generated.   RADIATION DOSE REDUCTION: This exam was performed according to the departmental dose-optimization program which includes automated exposure control,  adjustment of the mA and/or kV according to patient size and/or use of iterative reconstruction technique.   COMPARISON:  06/06/2021   FINDINGS: Alignment: Stable mild degenerative anterolisthesis of C4 relative to C5. Otherwise alignment is anatomic.   Skull base and vertebrae: No acute fracture. No primary bone lesion or focal pathologic process.   Soft tissues and spinal canal: No prevertebral fluid or swelling. No visible canal hematoma. Partial visualization of a right lobe thyroid  cyst, previously evaluated by ultrasound.   Disc levels: Significant multilevel left predominant facet hypertrophic changes greatest from C2-3 through C4-5. There is bony fusion across the left C4-5 facet. Disc spaces are relatively well preserved.   Upper chest: Airway is patent. Emphysematous changes are seen at the lung apices.   Other: Reconstructed images demonstrate no additional findings.   IMPRESSION: 1. No acute cervical spine fracture. 2. Progressive multilevel facet hypertrophic changes, greatest from C2-3 through C4-5.    PATIENT SURVEYS:  NDI: {:PHR,OPRCNDI}  COGNITION: Overall cognitive status: {cognition:24006}  SENSATION: {sensation:27233}  POSTURE: {posture:25561}  PALPATION: ***   CERVICAL ROM:   {AROM/PROM:27142} ROM A/PROM (deg) eval  Flexion   Extension   Right lateral flexion   Left lateral flexion   Right rotation   Left rotation    (Blank rows = not tested)  UPPER EXTREMITY ROM:  {AROM/PROM:27142} ROM Right eval Left eval  Shoulder flexion    Shoulder extension    Shoulder abduction    Shoulder adduction    Shoulder extension    Shoulder internal rotation    Shoulder external rotation    Elbow flexion    Elbow extension    Wrist flexion    Wrist extension    Wrist ulnar deviation    Wrist radial deviation    Wrist pronation    Wrist supination     (Blank rows = not tested)  UPPER EXTREMITY MMT:  MMT Right eval Left eval   Shoulder flexion    Shoulder extension    Shoulder abduction    Shoulder adduction    Shoulder extension    Shoulder internal rotation    Shoulder external rotation    Middle trapezius    Lower trapezius    Elbow flexion    Elbow extension    Wrist flexion    Wrist extension    Wrist ulnar deviation    Wrist radial deviation    Wrist pronation    Wrist supination    Grip strength     (Blank rows = not tested)  CERVICAL SPECIAL TESTS:  {  Cervical special tests:25246}  FUNCTIONAL TESTS:  {Functional tests:24029}  TREATMENT DATE:  08/31/2024   Evaluation: -ROM measured, Strength assessed, HEP prescribed, pt educated on prognosis, findings, and importance of HEP compliance if given.                                                                                                                                  PATIENT EDUCATION:  Education details: Pt was educated on findings of PT evaluation, prognosis, frequency of therapy visits and rationale, attendance policy, and HEP if given.   Person educated: {Person educated:25204} Education method: {Education Method:25205} Education comprehension: {Education Comprehension:25206}  HOME EXERCISE PROGRAM: ***  ASSESSMENT:  CLINICAL IMPRESSION: Patient is a 69 y.o. female who was seen today for physical therapy evaluation and treatment for R25.1 (ICD-10-CM) - Tremor M54.2 (ICD-10-CM) - Cervicalgia.   Patient demonstrates decreased cervical spine ROM, increased pain in neck on ***, and increased tension in bilateral upper trapezius and paraspinals of cervical spine. Patient also demonstrates tenderness to cervical and upper thoracic spinal segments from C3-T3 vertebrae. Patient requires moderate verbal cuing for relaxation of cervical spine musculature with difficulty. Pt requires education on role of PT, prognosis, education on cervical spine anatomy and corresponding musculature, importance of HEP compliance and overall POC.  Patient would benefit from skilled physical therapy for decreased neck pain, increased strength in cervical paraspinals and other postural musculature, and improved cervical spine mobility for improved quality of life, ability to work without symptom reproduction, return to higher level of function with ADLs, and progress towards therapy goals.   OBJECTIVE IMPAIRMENTS: {opptimpairments:25111}.   ACTIVITY LIMITATIONS: {activitylimitations:27494}  PARTICIPATION LIMITATIONS: {participationrestrictions:25113}  PERSONAL FACTORS: {Personal factors:25162} are also affecting patient's functional outcome.   REHAB POTENTIAL: {rehabpotential:25112}  CLINICAL DECISION MAKING: {clinical decision making:25114}  EVALUATION COMPLEXITY: {Evaluation complexity:25115}   GOALS: Goals reviewed with patient? {yes/no:20286}  SHORT TERM GOALS: Target date: ***  Pt will be independent with HEP in order to demonstrate participation in Physical Therapy POC.  Baseline: Goal status: {GOALSTATUS:25110}  2.  Pt will report ***/10 pain with cervical mobility in order to demonstrate improved pain with ADLs.  Baseline:  Goal status: {GOALSTATUS:25110}  LONG TERM GOALS: Target date: ***  Pt will report decreased cervicogenic caused headaches to less than *** per *** in order to demonstrate improved quality of life.  Baseline: see objective.  Goal status: {GOALSTATUS:25110}  2.  Pt will improve cervical ROM (flex/ext/lateral flexion/rotation) by *** in order to demonstrate improved functional ambulatory capacity in community setting.  Baseline: see objective.  Goal status: {GOALSTATUS:25110}  3.  Pt will improve NDI score by at least 11.75 points in order to demonstrate decreased pain with functional goals and outcomes. Baseline: see objective.  Goal status: {GOALSTATUS:25110}  4.  Pt will report ***/10 pain with cervical mobility in order to demonstrate reduced pain with ADLs that require use of cervical  spine musculature (driving, washing hair, reaching to elevated  cabinet).  Baseline: see objective.  Goal status: {GOALSTATUS:25110}     PLAN:  PT FREQUENCY: {rehab frequency:25116}  PT DURATION: {rehab duration:25117}  PLANNED INTERVENTIONS: {rehab planned interventions:25118::97110-Therapeutic exercises,97530- Therapeutic [phone]- Neuromuscular re-education,97535- Self Rjmz,02859- Manual therapy,Patient/Family education}  PLAN FOR NEXT SESSION: ***   Lang Ada, PT, DPT [facility], [place] Office: [phone] 10:11 AM, 08/31/24       "

## 2024-09-02 ENCOUNTER — Ambulatory Visit (HOSPITAL_COMMUNITY)

## 2024-09-03 ENCOUNTER — Encounter (HOSPITAL_COMMUNITY): Payer: Self-pay

## 2024-09-03 ENCOUNTER — Ambulatory Visit (HOSPITAL_COMMUNITY)

## 2024-09-04 ENCOUNTER — Telehealth (HOSPITAL_COMMUNITY): Payer: Self-pay | Admitting: Surgery

## 2024-09-04 NOTE — Telephone Encounter (Signed)
 I called the pt to remind her of her stress test tomorrow.  I left a VM with the following instructions:  do not eat/drink at least 6 hrs before the test (unless water with meds), no smoking 8 hrs before the test, wear comfortable clothes, do not wear any lotions/oils on your chest, hold Jardiance  and Isordil -per [name], PA-C, and check in at the front desk on arrival.  The pt called back and stated that she had an echo before her stress test, and that she understood all her medication instructions.  She was also instructed to bring her rescue inhaler.  I went over what to expect with the [facility] tomorrow and she verbalized understanding.

## 2024-09-05 ENCOUNTER — Ambulatory Visit (HOSPITAL_COMMUNITY)

## 2024-09-05 ENCOUNTER — Encounter (HOSPITAL_COMMUNITY)

## 2024-09-05 ENCOUNTER — Encounter (HOSPITAL_COMMUNITY): Payer: Self-pay

## 2024-09-06 NOTE — Progress Notes (Unsigned)
" ° °  09/06/2024  Patient ID: Kayla Ryan, female   DOB: Sep 23, 1955, 69 y.o.   MRN: [id number]  Pharmacy Quality Measure Review  This patient is appearing on the insurance-providing list for being at risk of failing the adherence measure for Statin Use in Persons with Diabetes (SUPD) medications this calendar year.  Per review of chart and payor information, patient has filled a medication indicated for diabetes this calendar year but is not currently filling a statin prescription.   pcp 3/10; previous G72.0, reminder set 14 days prior to attach G72.0 to pcp visit.   Lang Sieve, PharmD, BCGP Clinical Pharmacist  [phone] "

## 2024-09-10 ENCOUNTER — Ambulatory Visit (HOSPITAL_COMMUNITY)

## 2024-09-10 ENCOUNTER — Encounter (HOSPITAL_COMMUNITY): Payer: Self-pay

## 2024-09-11 ENCOUNTER — Other Ambulatory Visit: Payer: Self-pay | Admitting: Internal Medicine

## 2024-09-11 ENCOUNTER — Telehealth: Payer: Self-pay

## 2024-09-11 ENCOUNTER — Telehealth: Payer: Self-pay | Admitting: Internal Medicine

## 2024-09-11 DIAGNOSIS — M5030 Other cervical disc degeneration, unspecified cervical region: Secondary | ICD-10-CM

## 2024-09-11 DIAGNOSIS — G894 Chronic pain syndrome: Secondary | ICD-10-CM

## 2024-09-11 NOTE — Telephone Encounter (Signed)
 Faxed signed CMN and recent [id number] to [employer] at [phone]. Awaiting fax confirmation.

## 2024-09-11 NOTE — Telephone Encounter (Signed)
 Copied from CRM [id number]. Topic: Referral - Prior Authorization Question >> Sep 11, 2024  3:15 PM China J wrote: Reason for CRM: Patient is needing 6 visits for pain management authorized by Dr. Tobie. Patient is needing the start date of authorization to be marked for today (09/11/24) so that way she will not have to worry about any out of pocket costs.   Pain Management Location:  [facility] Dr. Oneil Ellen

## 2024-09-12 NOTE — Telephone Encounter (Signed)
 Fax confirmation received, NF[facility]

## 2024-09-18 ENCOUNTER — Ambulatory Visit (HOSPITAL_COMMUNITY)

## 2024-09-18 ENCOUNTER — Other Ambulatory Visit (HOSPITAL_COMMUNITY)

## 2024-09-18 ENCOUNTER — Encounter (HOSPITAL_COMMUNITY)

## 2024-09-23 ENCOUNTER — Telehealth: Admitting: *Deleted

## 2024-09-25 ENCOUNTER — Ambulatory Visit: Admitting: Physician Assistant

## 2024-10-09 ENCOUNTER — Ambulatory Visit: Admitting: Gastroenterology

## 2024-10-15 ENCOUNTER — Ambulatory Visit: Admitting: Internal Medicine

## 2024-11-19 ENCOUNTER — Inpatient Hospital Stay

## 2024-11-26 ENCOUNTER — Inpatient Hospital Stay: Admitting: Physician Assistant

## 2025-01-16 ENCOUNTER — Ambulatory Visit: Admitting: Emergency Medicine

## 2025-01-28 ENCOUNTER — Ambulatory Visit: Payer: Self-pay | Admitting: Neurology

## 2025-04-24 ENCOUNTER — Ambulatory Visit

## 2025-04-28 ENCOUNTER — Ambulatory Visit
